# Patient Record
Sex: Female | Born: 1965 | Race: Black or African American | Hispanic: No | Marital: Married | Smoking: Never smoker
Health system: Southern US, Community
[De-identification: ages and names within clinical notes are randomized; demographics above are authoritative.]

## PROBLEM LIST (undated history)

## (undated) DIAGNOSIS — R0600 Dyspnea, unspecified: Secondary | ICD-10-CM

## (undated) DIAGNOSIS — F419 Anxiety disorder, unspecified: Secondary | ICD-10-CM

## (undated) DIAGNOSIS — E669 Obesity, unspecified: Secondary | ICD-10-CM

## (undated) DIAGNOSIS — I2699 Other pulmonary embolism without acute cor pulmonale: Secondary | ICD-10-CM

## (undated) DIAGNOSIS — F319 Bipolar disorder, unspecified: Secondary | ICD-10-CM

## (undated) DIAGNOSIS — F329 Major depressive disorder, single episode, unspecified: Secondary | ICD-10-CM

## (undated) DIAGNOSIS — M1990 Unspecified osteoarthritis, unspecified site: Secondary | ICD-10-CM

## (undated) DIAGNOSIS — M549 Dorsalgia, unspecified: Secondary | ICD-10-CM

## (undated) DIAGNOSIS — M5430 Sciatica, unspecified side: Secondary | ICD-10-CM

## (undated) DIAGNOSIS — Z8709 Personal history of other diseases of the respiratory system: Secondary | ICD-10-CM

## (undated) DIAGNOSIS — F32A Depression, unspecified: Secondary | ICD-10-CM

## (undated) DIAGNOSIS — I10 Essential (primary) hypertension: Secondary | ICD-10-CM

## (undated) DIAGNOSIS — G8929 Other chronic pain: Secondary | ICD-10-CM

## (undated) HISTORY — DX: Major depressive disorder, single episode, unspecified: F32.9

## (undated) HISTORY — DX: Bipolar disorder, unspecified: F31.9

## (undated) HISTORY — DX: Essential (primary) hypertension: I10

## (undated) HISTORY — DX: Obesity, unspecified: E66.9

## (undated) HISTORY — DX: Unspecified osteoarthritis, unspecified site: M19.90

## (undated) HISTORY — DX: Depression, unspecified: F32.A

## (undated) HISTORY — PX: ADENOIDECTOMY: SUR15

## (undated) HISTORY — PX: TONSILLECTOMY: SUR1361

## (undated) HISTORY — PX: TYMPANOSTOMY TUBE PLACEMENT: SHX32

## (undated) HISTORY — PX: BIOPSY STOMACH: PRO33

---

## 1998-04-26 ENCOUNTER — Emergency Department (HOSPITAL_COMMUNITY): Admission: EM | Admit: 1998-04-26 | Discharge: 1998-04-26 | Payer: Self-pay | Admitting: Emergency Medicine

## 1998-12-12 ENCOUNTER — Emergency Department (HOSPITAL_COMMUNITY): Admission: EM | Admit: 1998-12-12 | Discharge: 1998-12-12 | Payer: Self-pay | Admitting: Emergency Medicine

## 1999-06-21 ENCOUNTER — Emergency Department (HOSPITAL_COMMUNITY): Admission: EM | Admit: 1999-06-21 | Discharge: 1999-06-21 | Payer: Self-pay | Admitting: Emergency Medicine

## 1999-10-19 ENCOUNTER — Emergency Department (HOSPITAL_COMMUNITY): Admission: EM | Admit: 1999-10-19 | Discharge: 1999-10-19 | Payer: Self-pay | Admitting: Emergency Medicine

## 1999-10-28 ENCOUNTER — Emergency Department (HOSPITAL_COMMUNITY): Admission: EM | Admit: 1999-10-28 | Discharge: 1999-10-28 | Payer: Self-pay | Admitting: Emergency Medicine

## 2000-02-26 ENCOUNTER — Encounter: Payer: Self-pay | Admitting: *Deleted

## 2000-02-26 ENCOUNTER — Emergency Department (HOSPITAL_COMMUNITY): Admission: EM | Admit: 2000-02-26 | Discharge: 2000-02-26 | Payer: Self-pay | Admitting: *Deleted

## 2000-05-03 ENCOUNTER — Inpatient Hospital Stay (HOSPITAL_COMMUNITY): Admission: AD | Admit: 2000-05-03 | Discharge: 2000-05-03 | Payer: Self-pay | Admitting: *Deleted

## 2000-05-25 ENCOUNTER — Ambulatory Visit (HOSPITAL_COMMUNITY): Admission: RE | Admit: 2000-05-25 | Discharge: 2000-05-25 | Payer: Self-pay | Admitting: *Deleted

## 2000-07-19 ENCOUNTER — Ambulatory Visit (HOSPITAL_COMMUNITY): Admission: RE | Admit: 2000-07-19 | Discharge: 2000-07-19 | Payer: Self-pay | Admitting: *Deleted

## 2000-09-04 ENCOUNTER — Ambulatory Visit (HOSPITAL_COMMUNITY): Admission: RE | Admit: 2000-09-04 | Discharge: 2000-09-04 | Payer: Self-pay | Admitting: *Deleted

## 2000-10-13 ENCOUNTER — Inpatient Hospital Stay (HOSPITAL_COMMUNITY): Admission: AD | Admit: 2000-10-13 | Discharge: 2000-10-13 | Payer: Self-pay | Admitting: *Deleted

## 2000-12-06 ENCOUNTER — Inpatient Hospital Stay (HOSPITAL_COMMUNITY): Admission: AD | Admit: 2000-12-06 | Discharge: 2000-12-09 | Payer: Self-pay | Admitting: Obstetrics

## 2000-12-06 ENCOUNTER — Encounter (INDEPENDENT_AMBULATORY_CARE_PROVIDER_SITE_OTHER): Payer: Self-pay | Admitting: Specialist

## 2000-12-11 ENCOUNTER — Inpatient Hospital Stay (HOSPITAL_COMMUNITY): Admission: AD | Admit: 2000-12-11 | Discharge: 2000-12-11 | Payer: Self-pay | Admitting: Obstetrics

## 2001-04-16 ENCOUNTER — Emergency Department (HOSPITAL_COMMUNITY): Admission: EM | Admit: 2001-04-16 | Discharge: 2001-04-16 | Payer: Self-pay | Admitting: Emergency Medicine

## 2001-05-23 ENCOUNTER — Emergency Department (HOSPITAL_COMMUNITY): Admission: EM | Admit: 2001-05-23 | Discharge: 2001-05-23 | Payer: Self-pay | Admitting: Emergency Medicine

## 2001-07-04 ENCOUNTER — Encounter: Admission: RE | Admit: 2001-07-04 | Discharge: 2001-07-04 | Payer: Self-pay | Admitting: Family Medicine

## 2001-08-13 ENCOUNTER — Encounter: Admission: RE | Admit: 2001-08-13 | Discharge: 2001-08-13 | Payer: Self-pay | Admitting: Family Medicine

## 2001-08-15 ENCOUNTER — Encounter: Admission: RE | Admit: 2001-08-15 | Discharge: 2001-11-13 | Payer: Self-pay | Admitting: Family Medicine

## 2001-09-02 ENCOUNTER — Encounter: Admission: RE | Admit: 2001-09-02 | Discharge: 2001-09-02 | Payer: Self-pay | Admitting: Sports Medicine

## 2001-09-02 ENCOUNTER — Other Ambulatory Visit: Admission: RE | Admit: 2001-09-02 | Discharge: 2001-09-18 | Payer: Self-pay | Admitting: Obstetrics & Gynecology

## 2001-09-24 ENCOUNTER — Ambulatory Visit (HOSPITAL_BASED_OUTPATIENT_CLINIC_OR_DEPARTMENT_OTHER): Admission: RE | Admit: 2001-09-24 | Discharge: 2001-09-24 | Payer: Self-pay | Admitting: Ophthalmology

## 2001-10-08 ENCOUNTER — Encounter: Admission: RE | Admit: 2001-10-08 | Discharge: 2001-10-08 | Payer: Self-pay | Admitting: Family Medicine

## 2001-11-15 ENCOUNTER — Encounter: Admission: RE | Admit: 2001-11-15 | Discharge: 2001-11-15 | Payer: Self-pay | Admitting: Family Medicine

## 2001-12-16 ENCOUNTER — Encounter: Admission: RE | Admit: 2001-12-16 | Discharge: 2001-12-16 | Payer: Self-pay | Admitting: Family Medicine

## 2002-01-03 ENCOUNTER — Emergency Department (HOSPITAL_COMMUNITY): Admission: EM | Admit: 2002-01-03 | Discharge: 2002-01-03 | Payer: Self-pay | Admitting: Emergency Medicine

## 2002-02-04 ENCOUNTER — Encounter: Payer: Self-pay | Admitting: Emergency Medicine

## 2002-02-04 ENCOUNTER — Emergency Department (HOSPITAL_COMMUNITY): Admission: EM | Admit: 2002-02-04 | Discharge: 2002-02-04 | Payer: Self-pay | Admitting: Emergency Medicine

## 2002-02-27 ENCOUNTER — Encounter: Admission: RE | Admit: 2002-02-27 | Discharge: 2002-02-27 | Payer: Self-pay | Admitting: Sports Medicine

## 2002-04-04 ENCOUNTER — Encounter: Admission: RE | Admit: 2002-04-04 | Discharge: 2002-04-04 | Payer: Self-pay | Admitting: Family Medicine

## 2002-04-09 ENCOUNTER — Encounter: Admission: RE | Admit: 2002-04-09 | Discharge: 2002-04-09 | Payer: Self-pay | Admitting: Sports Medicine

## 2002-04-09 ENCOUNTER — Encounter: Payer: Self-pay | Admitting: Sports Medicine

## 2002-04-10 ENCOUNTER — Encounter: Admission: RE | Admit: 2002-04-10 | Discharge: 2002-04-10 | Payer: Self-pay | Admitting: Family Medicine

## 2002-04-24 ENCOUNTER — Encounter: Admission: RE | Admit: 2002-04-24 | Discharge: 2002-04-24 | Payer: Self-pay | Admitting: Family Medicine

## 2002-08-27 ENCOUNTER — Emergency Department (HOSPITAL_COMMUNITY): Admission: EM | Admit: 2002-08-27 | Discharge: 2002-08-28 | Payer: Self-pay | Admitting: Emergency Medicine

## 2003-01-05 ENCOUNTER — Encounter: Admission: RE | Admit: 2003-01-05 | Discharge: 2003-01-05 | Payer: Self-pay | Admitting: Family Medicine

## 2003-02-12 ENCOUNTER — Encounter: Admission: RE | Admit: 2003-02-12 | Discharge: 2003-02-12 | Payer: Self-pay | Admitting: Family Medicine

## 2003-03-22 ENCOUNTER — Emergency Department (HOSPITAL_COMMUNITY): Admission: EM | Admit: 2003-03-22 | Discharge: 2003-03-22 | Payer: Self-pay | Admitting: Unknown Physician Specialty

## 2003-03-25 ENCOUNTER — Encounter: Admission: RE | Admit: 2003-03-25 | Discharge: 2003-03-25 | Payer: Self-pay | Admitting: Family Medicine

## 2003-04-09 ENCOUNTER — Encounter: Admission: RE | Admit: 2003-04-09 | Discharge: 2003-04-09 | Payer: Self-pay | Admitting: Sports Medicine

## 2003-04-26 ENCOUNTER — Encounter: Payer: Self-pay | Admitting: Emergency Medicine

## 2003-04-26 ENCOUNTER — Emergency Department (HOSPITAL_COMMUNITY): Admission: EM | Admit: 2003-04-26 | Discharge: 2003-04-26 | Payer: Self-pay | Admitting: Emergency Medicine

## 2003-06-23 ENCOUNTER — Encounter: Admission: RE | Admit: 2003-06-23 | Discharge: 2003-06-23 | Payer: Self-pay | Admitting: Family Medicine

## 2003-07-07 ENCOUNTER — Emergency Department (HOSPITAL_COMMUNITY): Admission: EM | Admit: 2003-07-07 | Discharge: 2003-07-07 | Payer: Self-pay | Admitting: Emergency Medicine

## 2003-07-23 ENCOUNTER — Encounter: Admission: RE | Admit: 2003-07-23 | Discharge: 2003-07-23 | Payer: Self-pay | Admitting: Family Medicine

## 2003-07-25 ENCOUNTER — Emergency Department (HOSPITAL_COMMUNITY): Admission: EM | Admit: 2003-07-25 | Discharge: 2003-07-25 | Payer: Self-pay | Admitting: Emergency Medicine

## 2003-07-29 ENCOUNTER — Encounter: Admission: RE | Admit: 2003-07-29 | Discharge: 2003-07-29 | Payer: Self-pay | Admitting: Family Medicine

## 2003-08-28 ENCOUNTER — Encounter: Admission: RE | Admit: 2003-08-28 | Discharge: 2003-08-28 | Payer: Self-pay | Admitting: Sports Medicine

## 2003-08-28 ENCOUNTER — Other Ambulatory Visit: Admission: RE | Admit: 2003-08-28 | Discharge: 2003-08-28 | Payer: Self-pay | Admitting: Family Medicine

## 2003-10-17 ENCOUNTER — Emergency Department (HOSPITAL_COMMUNITY): Admission: EM | Admit: 2003-10-17 | Discharge: 2003-10-17 | Payer: Self-pay | Admitting: Emergency Medicine

## 2003-11-07 HISTORY — PX: KNEE SURGERY: SHX244

## 2003-11-13 ENCOUNTER — Ambulatory Visit (HOSPITAL_COMMUNITY): Admission: RE | Admit: 2003-11-13 | Discharge: 2003-11-13 | Payer: Self-pay | Admitting: Family Medicine

## 2003-11-13 ENCOUNTER — Encounter: Admission: RE | Admit: 2003-11-13 | Discharge: 2003-11-13 | Payer: Self-pay | Admitting: Family Medicine

## 2003-11-26 ENCOUNTER — Encounter: Admission: RE | Admit: 2003-11-26 | Discharge: 2003-11-26 | Payer: Self-pay | Admitting: Family Medicine

## 2003-12-01 ENCOUNTER — Emergency Department (HOSPITAL_COMMUNITY): Admission: EM | Admit: 2003-12-01 | Discharge: 2003-12-02 | Payer: Self-pay | Admitting: Emergency Medicine

## 2003-12-03 ENCOUNTER — Encounter: Admission: RE | Admit: 2003-12-03 | Discharge: 2003-12-03 | Payer: Self-pay | Admitting: Family Medicine

## 2003-12-08 ENCOUNTER — Encounter: Admission: RE | Admit: 2003-12-08 | Discharge: 2003-12-08 | Payer: Self-pay | Admitting: Family Medicine

## 2003-12-09 ENCOUNTER — Ambulatory Visit (HOSPITAL_COMMUNITY): Admission: RE | Admit: 2003-12-09 | Discharge: 2003-12-09 | Payer: Self-pay | Admitting: Family Medicine

## 2003-12-09 ENCOUNTER — Encounter: Admission: RE | Admit: 2003-12-09 | Discharge: 2003-12-09 | Payer: Self-pay | Admitting: Family Medicine

## 2003-12-10 ENCOUNTER — Encounter: Admission: RE | Admit: 2003-12-10 | Discharge: 2003-12-10 | Payer: Self-pay | Admitting: Sports Medicine

## 2003-12-18 ENCOUNTER — Emergency Department (HOSPITAL_COMMUNITY): Admission: EM | Admit: 2003-12-18 | Discharge: 2003-12-18 | Payer: Self-pay | Admitting: Family Medicine

## 2003-12-30 ENCOUNTER — Encounter: Payer: Self-pay | Admitting: Cardiology

## 2003-12-30 ENCOUNTER — Encounter: Admission: RE | Admit: 2003-12-30 | Discharge: 2003-12-30 | Payer: Self-pay | Admitting: Family Medicine

## 2003-12-30 ENCOUNTER — Ambulatory Visit (HOSPITAL_COMMUNITY): Admission: RE | Admit: 2003-12-30 | Discharge: 2003-12-30 | Payer: Self-pay | Admitting: Sports Medicine

## 2004-01-12 ENCOUNTER — Encounter: Admission: RE | Admit: 2004-01-12 | Discharge: 2004-01-12 | Payer: Self-pay | Admitting: Family Medicine

## 2004-01-15 ENCOUNTER — Encounter: Admission: RE | Admit: 2004-01-15 | Discharge: 2004-01-15 | Payer: Self-pay | Admitting: Family Medicine

## 2004-01-20 ENCOUNTER — Encounter (HOSPITAL_COMMUNITY): Admission: RE | Admit: 2004-01-20 | Discharge: 2004-04-19 | Payer: Self-pay | Admitting: Cardiovascular Disease

## 2004-02-05 ENCOUNTER — Encounter: Admission: RE | Admit: 2004-02-05 | Discharge: 2004-02-05 | Payer: Self-pay | Admitting: Family Medicine

## 2004-02-15 ENCOUNTER — Encounter: Admission: RE | Admit: 2004-02-15 | Discharge: 2004-02-15 | Payer: Self-pay | Admitting: Family Medicine

## 2004-03-31 ENCOUNTER — Emergency Department (HOSPITAL_COMMUNITY): Admission: EM | Admit: 2004-03-31 | Discharge: 2004-04-01 | Payer: Self-pay | Admitting: Emergency Medicine

## 2004-08-09 ENCOUNTER — Emergency Department (HOSPITAL_COMMUNITY): Admission: EM | Admit: 2004-08-09 | Discharge: 2004-08-10 | Payer: Self-pay | Admitting: Emergency Medicine

## 2004-08-21 ENCOUNTER — Emergency Department (HOSPITAL_COMMUNITY): Admission: EM | Admit: 2004-08-21 | Discharge: 2004-08-21 | Payer: Self-pay | Admitting: Family Medicine

## 2004-10-29 ENCOUNTER — Emergency Department (HOSPITAL_COMMUNITY): Admission: EM | Admit: 2004-10-29 | Discharge: 2004-10-29 | Payer: Self-pay | Admitting: Family Medicine

## 2004-10-31 ENCOUNTER — Emergency Department (HOSPITAL_COMMUNITY): Admission: EM | Admit: 2004-10-31 | Discharge: 2004-10-31 | Payer: Self-pay | Admitting: Family Medicine

## 2004-11-11 ENCOUNTER — Encounter (INDEPENDENT_AMBULATORY_CARE_PROVIDER_SITE_OTHER): Payer: Self-pay | Admitting: *Deleted

## 2004-11-11 LAB — CONVERTED CEMR LAB

## 2004-11-28 ENCOUNTER — Other Ambulatory Visit: Admission: RE | Admit: 2004-11-28 | Discharge: 2004-11-28 | Payer: Self-pay | Admitting: Family Medicine

## 2004-11-28 ENCOUNTER — Ambulatory Visit: Payer: Self-pay | Admitting: Family Medicine

## 2004-12-29 ENCOUNTER — Ambulatory Visit: Payer: Self-pay | Admitting: Family Medicine

## 2005-01-10 ENCOUNTER — Ambulatory Visit: Payer: Self-pay | Admitting: Family Medicine

## 2005-01-11 ENCOUNTER — Ambulatory Visit: Payer: Self-pay | Admitting: Family Medicine

## 2005-01-14 ENCOUNTER — Emergency Department (HOSPITAL_COMMUNITY): Admission: EM | Admit: 2005-01-14 | Discharge: 2005-01-15 | Payer: Self-pay | Admitting: Emergency Medicine

## 2005-01-18 ENCOUNTER — Ambulatory Visit: Payer: Self-pay | Admitting: Family Medicine

## 2005-01-20 ENCOUNTER — Encounter: Admission: RE | Admit: 2005-01-20 | Discharge: 2005-01-20 | Payer: Self-pay | Admitting: Sports Medicine

## 2005-02-08 ENCOUNTER — Emergency Department (HOSPITAL_COMMUNITY): Admission: EM | Admit: 2005-02-08 | Discharge: 2005-02-08 | Payer: Self-pay | Admitting: Emergency Medicine

## 2005-02-15 ENCOUNTER — Ambulatory Visit: Payer: Self-pay | Admitting: Family Medicine

## 2005-03-08 ENCOUNTER — Ambulatory Visit: Payer: Self-pay | Admitting: Family Medicine

## 2005-03-12 ENCOUNTER — Emergency Department (HOSPITAL_COMMUNITY): Admission: EM | Admit: 2005-03-12 | Discharge: 2005-03-12 | Payer: Self-pay | Admitting: Family Medicine

## 2005-11-06 HISTORY — PX: CHOLECYSTECTOMY: SHX55

## 2007-01-03 DIAGNOSIS — F329 Major depressive disorder, single episode, unspecified: Secondary | ICD-10-CM

## 2007-01-03 DIAGNOSIS — I871 Compression of vein: Secondary | ICD-10-CM

## 2007-01-03 DIAGNOSIS — F32A Depression, unspecified: Secondary | ICD-10-CM | POA: Insufficient documentation

## 2007-01-03 DIAGNOSIS — E669 Obesity, unspecified: Secondary | ICD-10-CM | POA: Insufficient documentation

## 2007-01-03 DIAGNOSIS — H9190 Unspecified hearing loss, unspecified ear: Secondary | ICD-10-CM | POA: Insufficient documentation

## 2007-01-03 DIAGNOSIS — K589 Irritable bowel syndrome without diarrhea: Secondary | ICD-10-CM | POA: Insufficient documentation

## 2007-01-04 ENCOUNTER — Encounter (INDEPENDENT_AMBULATORY_CARE_PROVIDER_SITE_OTHER): Payer: Self-pay | Admitting: *Deleted

## 2007-11-07 HISTORY — PX: CARDIAC CATHETERIZATION: SHX172

## 2009-03-19 ENCOUNTER — Emergency Department (HOSPITAL_COMMUNITY): Admission: EM | Admit: 2009-03-19 | Discharge: 2009-03-20 | Payer: Self-pay | Admitting: Emergency Medicine

## 2009-06-23 ENCOUNTER — Emergency Department (HOSPITAL_COMMUNITY): Admission: EM | Admit: 2009-06-23 | Discharge: 2009-06-23 | Payer: Self-pay | Admitting: Family Medicine

## 2009-07-24 ENCOUNTER — Emergency Department (HOSPITAL_COMMUNITY): Admission: EM | Admit: 2009-07-24 | Discharge: 2009-07-24 | Payer: Self-pay | Admitting: Emergency Medicine

## 2009-08-03 ENCOUNTER — Emergency Department (HOSPITAL_COMMUNITY): Admission: EM | Admit: 2009-08-03 | Discharge: 2009-08-03 | Payer: Self-pay | Admitting: Emergency Medicine

## 2009-11-06 LAB — CONVERTED CEMR LAB: Pap Smear: NORMAL

## 2009-12-01 ENCOUNTER — Emergency Department (HOSPITAL_COMMUNITY): Admission: EM | Admit: 2009-12-01 | Discharge: 2009-12-01 | Payer: Self-pay | Admitting: Emergency Medicine

## 2010-01-17 ENCOUNTER — Encounter: Payer: Self-pay | Admitting: Family Medicine

## 2010-01-17 ENCOUNTER — Ambulatory Visit: Payer: Self-pay | Admitting: Family Medicine

## 2010-01-17 DIAGNOSIS — G4700 Insomnia, unspecified: Secondary | ICD-10-CM

## 2010-01-17 DIAGNOSIS — F319 Bipolar disorder, unspecified: Secondary | ICD-10-CM | POA: Insufficient documentation

## 2010-01-17 DIAGNOSIS — I10 Essential (primary) hypertension: Secondary | ICD-10-CM | POA: Insufficient documentation

## 2010-01-27 ENCOUNTER — Ambulatory Visit: Payer: Self-pay | Admitting: Family Medicine

## 2010-01-27 ENCOUNTER — Encounter: Payer: Self-pay | Admitting: Family Medicine

## 2010-01-31 LAB — CONVERTED CEMR LAB
ALT: 10 units/L (ref 0–35)
Albumin: 4.1 g/dL (ref 3.5–5.2)
CO2: 29 meq/L (ref 19–32)
Cholesterol: 157 mg/dL (ref 0–200)
Glucose, Bld: 89 mg/dL (ref 70–99)
LDL Cholesterol: 96 mg/dL (ref 0–99)
Platelets: 356 10*3/uL (ref 150–400)
Potassium: 3.2 meq/L — ABNORMAL LOW (ref 3.5–5.3)
RBC: 4.31 M/uL (ref 3.87–5.11)
Sodium: 135 meq/L (ref 135–145)
Total Protein: 7.7 g/dL (ref 6.0–8.3)
Triglycerides: 69 mg/dL (ref <150)
VLDL: 14 mg/dL (ref 0–40)
Vit D, 25-Hydroxy: 11 ng/mL — ABNORMAL LOW (ref 30–89)
WBC: 8.7 10*3/uL (ref 4.0–10.5)

## 2010-02-18 ENCOUNTER — Ambulatory Visit: Payer: Self-pay | Admitting: Family Medicine

## 2010-02-18 DIAGNOSIS — E559 Vitamin D deficiency, unspecified: Secondary | ICD-10-CM | POA: Insufficient documentation

## 2010-02-21 ENCOUNTER — Encounter: Payer: Self-pay | Admitting: Family Medicine

## 2010-02-24 ENCOUNTER — Encounter: Admission: RE | Admit: 2010-02-24 | Discharge: 2010-02-24 | Payer: Self-pay | Admitting: Emergency Medicine

## 2010-02-24 ENCOUNTER — Emergency Department (HOSPITAL_COMMUNITY): Admission: EM | Admit: 2010-02-24 | Discharge: 2010-02-24 | Payer: Self-pay | Admitting: Family Medicine

## 2010-03-07 ENCOUNTER — Ambulatory Visit: Payer: Self-pay | Admitting: Family Medicine

## 2010-03-25 ENCOUNTER — Encounter: Payer: Self-pay | Admitting: Family Medicine

## 2010-03-25 ENCOUNTER — Emergency Department (HOSPITAL_COMMUNITY): Admission: EM | Admit: 2010-03-25 | Discharge: 2010-03-25 | Payer: Self-pay | Admitting: Emergency Medicine

## 2010-04-11 ENCOUNTER — Ambulatory Visit: Payer: Self-pay | Admitting: Family Medicine

## 2010-04-11 DIAGNOSIS — M719 Bursopathy, unspecified: Secondary | ICD-10-CM

## 2010-04-11 DIAGNOSIS — M67919 Unspecified disorder of synovium and tendon, unspecified shoulder: Secondary | ICD-10-CM | POA: Insufficient documentation

## 2010-04-29 ENCOUNTER — Ambulatory Visit: Payer: Self-pay | Admitting: Family Medicine

## 2010-06-22 ENCOUNTER — Ambulatory Visit: Payer: Self-pay | Admitting: Family Medicine

## 2010-06-22 DIAGNOSIS — M1710 Unilateral primary osteoarthritis, unspecified knee: Secondary | ICD-10-CM | POA: Insufficient documentation

## 2010-07-06 ENCOUNTER — Encounter
Admission: RE | Admit: 2010-07-06 | Discharge: 2010-07-31 | Payer: Self-pay | Source: Home / Self Care | Admitting: Family Medicine

## 2010-07-19 ENCOUNTER — Encounter: Payer: Self-pay | Admitting: Family Medicine

## 2010-07-19 ENCOUNTER — Telehealth: Payer: Self-pay | Admitting: Psychology

## 2010-07-19 ENCOUNTER — Encounter: Payer: Self-pay | Admitting: *Deleted

## 2010-07-19 ENCOUNTER — Ambulatory Visit: Payer: Self-pay | Admitting: Family Medicine

## 2010-07-19 LAB — CONVERTED CEMR LAB
BUN: 12 mg/dL
CO2: 30 meq/L
Calcium: 8.7 mg/dL
Chloride: 102 meq/L
Creatinine, Ser: 0.66 mg/dL
Glucose, Bld: 92 mg/dL
Potassium: 3.9 meq/L
Sodium: 139 meq/L

## 2010-07-22 ENCOUNTER — Ambulatory Visit (HOSPITAL_COMMUNITY): Admission: RE | Admit: 2010-07-22 | Discharge: 2010-07-22 | Payer: Self-pay | Admitting: Family Medicine

## 2010-07-23 ENCOUNTER — Emergency Department (HOSPITAL_COMMUNITY): Admission: EM | Admit: 2010-07-23 | Discharge: 2010-07-23 | Payer: Self-pay | Admitting: Emergency Medicine

## 2010-07-27 ENCOUNTER — Encounter: Payer: Self-pay | Admitting: Family Medicine

## 2010-08-02 ENCOUNTER — Ambulatory Visit: Payer: Self-pay | Admitting: Family Medicine

## 2010-08-08 ENCOUNTER — Ambulatory Visit: Payer: Self-pay | Admitting: Family Medicine

## 2010-08-12 ENCOUNTER — Ambulatory Visit: Payer: Self-pay | Admitting: Family Medicine

## 2010-08-29 ENCOUNTER — Emergency Department (HOSPITAL_COMMUNITY): Admission: EM | Admit: 2010-08-29 | Discharge: 2010-08-29 | Payer: Self-pay | Admitting: Emergency Medicine

## 2010-08-30 ENCOUNTER — Ambulatory Visit: Payer: Self-pay | Admitting: Family Medicine

## 2010-09-10 ENCOUNTER — Emergency Department (HOSPITAL_COMMUNITY): Admission: EM | Admit: 2010-09-10 | Discharge: 2010-09-10 | Payer: Self-pay | Admitting: Emergency Medicine

## 2010-09-14 ENCOUNTER — Ambulatory Visit: Payer: Self-pay | Admitting: Family Medicine

## 2010-09-14 DIAGNOSIS — M722 Plantar fascial fibromatosis: Secondary | ICD-10-CM | POA: Insufficient documentation

## 2010-10-22 ENCOUNTER — Encounter
Admission: RE | Admit: 2010-10-22 | Discharge: 2010-10-22 | Payer: Self-pay | Source: Home / Self Care | Attending: Oral Surgery | Admitting: Oral Surgery

## 2010-10-31 ENCOUNTER — Emergency Department (HOSPITAL_COMMUNITY)
Admission: EM | Admit: 2010-10-31 | Discharge: 2010-10-31 | Payer: Self-pay | Source: Home / Self Care | Admitting: Emergency Medicine

## 2010-11-10 ENCOUNTER — Ambulatory Visit: Admission: RE | Admit: 2010-11-10 | Discharge: 2010-11-10 | Payer: Self-pay | Source: Home / Self Care

## 2010-11-10 DIAGNOSIS — N946 Dysmenorrhea, unspecified: Secondary | ICD-10-CM | POA: Insufficient documentation

## 2010-11-13 ENCOUNTER — Emergency Department (HOSPITAL_COMMUNITY)
Admission: EM | Admit: 2010-11-13 | Discharge: 2010-11-14 | Payer: Self-pay | Source: Home / Self Care | Admitting: Emergency Medicine

## 2010-11-23 ENCOUNTER — Emergency Department (HOSPITAL_COMMUNITY)
Admission: EM | Admit: 2010-11-23 | Discharge: 2010-11-23 | Payer: Self-pay | Source: Home / Self Care | Admitting: Emergency Medicine

## 2010-12-08 NOTE — Assessment & Plan Note (Signed)
Summary: discuss knee surgery,df   Vital Signs:  Patient profile:   45 year old female Height:      65 inches Weight:      288.56 pounds BMI:     48.19 BSA:     2.31 Temp:     98.1 degrees F Pulse rate:   66 / minute BP sitting:   123 / 83  Vitals Entered By: Jone Baseman CMA (September 14, 2010 8:57 AM) CC: discuss knee surgery Is Patient Diabetic? No Pain Assessment Patient in pain? yes     Location: knees Intensity: 8   Primary Care Finn Amos:  Antoine Primas DO  CC:  discuss knee surgery.  History of Present Illness: 45 yo female here for f/u on knee pain  1.  Knee pain-   Pt did go to [facility] and recieved steroid injections that only lasted three days.  Pt is still having significant pain, mostly after she is done wlaking seems to be worse.  Pt has had rheumatological workup which was negative as well.  Pt has hcx of arthroscopiy on right knee by Dr. Farris Has in 2005 and pt is at the point that she feels she needs another intervention, she is unable to do all daily activities as she did before.  No swelling no discoloration, still able to bear weight though.  Tried tylenol with no help and vicodin only made her sleepy.   2.  BP today:123/83 Taking Meds:yes Side Effects:no ROS: denies Headache visual changes nausea vomiting abdominal pain numbness in extremities    3. Heel pain-  Pt states for the last couple months has had heel pain that has become worse.  Pt states it is worse right after sitting a longtime or in the AM.  Pt states once she get moving it helps.  Pain usually staty at the lower hell but can radiate down the foot. No numbness or loss of strength.   4. Obesity- Pt has lost 3 # since last visit.  Pt states she has been trying to workout but due to her pain issues is having trouble. Pt is watching her diet and is seeing a nutritionist.     Habits & Providers  Alcohol-Tobacco-Diet     Tobacco Status: never  Current Medications (verified): 1)   Hydrochlorothiazide 25 Mg Tabs (Hydrochlorothiazide) .... One Tab By Mouth Daily For Blood Pressure 2)  Vistaril 50 Mg Caps (Hydroxyzine Pamoate) .... One Capsule Every 6 Hours 3)  Toprol Xl 25 Mg Xr24h-Tab (Metoprolol Succinate) .... One Tab By Mouth Daily For Blood Pressure 4)  Pristiq 50 Mg Xr24h-Tab (Desvenlafaxine Succinate) .... One Tab By Mouth Daily For Depression 5)  Lamotrigine 200 Mg Tabs (Lamotrigine) .... One Tab By Mouth Every Morning 6)  Zofran 8 Mg Tabs (Ondansetron Hcl) .... One Tab By Mouth Two Times A Day Prn 7)  Prevacid 30 Mg Cpdr (Lansoprazole) .... One By Mouth Daily For Reflux 8)  D3-50 50000 Unit Caps (Cholecalciferol) .... One By Mouth Once A Week For 8 Weeks 9)  Calcium Carbonate-Vitamin D 600-400 Mg-Unit Tabs (Calcium Carbonate-Vitamin D) .... One By Mouth Two Times A Day With Meals 10)  Nortriptyline Hcl 25 Mg Caps (Nortriptyline Hcl) .... Take 1 Pill At Night 11)  Diclofenac Sodium 75 Mg Tbec (Diclofenac Sodium) .... Take 1 Tab By Mouth By Mouth Two Times A Day  Allergies (verified): No Known Drug Allergies  Past History:  Past medical, surgical, family and social histories (including risk factors) reviewed, and no  changes noted (except as noted below).  Past Medical History: Reviewed history from 01/17/2010 and no changes required. depression - diagnosed  ~ 2000 bipolar disorder  insomnia - has tried trazodone, lunesta, rozerem HTN h/o low heart rate Raynaud's phenomenon  Past Surgical History: Reviewed history from 01/17/2010 and no changes required. CS x 2 1997, 2002 BTL 2002 heart cath in [place] 2006 -- no disease per the patient GB removed  ~ 2007 arthroscopic knee surgery 11/05 tonils and adenoids out as a shild  Family History: Reviewed history from 01/17/2010 and no changes required. brother - HIV, brother- hearing loss,  father - deceased, DM,  mother - CAD, MI at age 85, sister -  passed away from cardiac abnorm, rheumatic dz,  obesity  Social History: Reviewed history from 03/07/2010 and no changes required. no cigarettes; occasional THC; 1-2 mixed drinks per week.  Exercises about 4 x per week with walking. Lives with 2 dtrs (13 and 9). Not working right now. 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  Review of Systems       denies fever, chills, nausea, vomiting, diarrhea or constipation see hpi  Physical Exam  General:  alert, well-developed, well-hydrated, and overweight-appearing.   Mouth:  right upper 1st molar on right side has broken tooth with erythema surrounding it no frank pus notied.  Lungs:  work of breathing unlabored, clear to auscultation bilaterally; no wheezes, rales, or ronchi; good air movement throughout  Heart:  regular rate and rhythm, no murmurs; normal s1/s2  Abdomen:  BS+, NT obese Msk:  B knees external changes of OA.  Right knee TTP medial > lateral joint line. no effusion, no erythema or warmth. well healed circular dime shaped scar medial superior knee. calf is soft.  Mild crepitus on movement.  PCL, ACL, MCL LCL intact  Left knee less significant findings then right knee but still has crepitus, and tender to palpation over the medial joint line Pulses:  2+ Extremities:  right foot, pain over the medical arch and the heel, no erythem no swelling. calf soft. achilles tendon no pain.  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  Pt is having problems with daily activities at this time and is not dealing with the pain well.   Will stop the vicodin, will give diclofenac to try with food.  Pt had x-rays done in 9/11 that showed significant OA for her age. Will send pt to orthopaedics to have evaluation, pt saw Dr. Farris Has in 2005 and had arthroscopic procedure. Pt would be willing to have another one  if it would be benficial.  Pt had rheumatological workup which was negative.  The following medications were removed from the medication list:    Vicodin 5-500 Mg Tabs (Hydrocodone-acetaminophen) .Marland Kitchen... Take 1-2 every 6 hours for pain Her updated medication list for this problem includes:    Diclofenac Sodium 75 Mg Tbec (Diclofenac sodium) .Marland Kitchen... Take 1 tab by mouth by mouth two times a day  Orders: [facility]- Est  Level 4 (16109) Orthopedic Referral (Ortho)  Problem # 2:  ESSENTIAL HYPERTENSION, BENIGN (ICD-401.1) at goal will continue current treatment Her updated medication list for this problem includes:    Hydrochlorothiazide 25 Mg Tabs (Hydrochlorothiazide) ..... One tab by mouth daily for blood pressure    Toprol Xl 25 Mg Xr24h-tab (Metoprolol succinate) ..... One tab  by mouth daily for blood pressure  Orders: [facility]- Est  Level 4 (99214)  Problem # 3:  OBESITY, NOS (ICD-278.00) gained 3 # hard to exercise due to knee pain and plantar fascitis. Talked about food choices and nutrition and the need for decreasing calories.  Orders: [facility]- Est  Level 4 (16109)  Problem # 4:  PLANTAR FASCIITIS (ICD-728.71) Assessment: New new, told pt of brace at night and gave exercises with tennis ball and sode can to help, falling arches so did talk about arch support insoles.  If still having problems would consider sending to sports medicine for orthotic to be made. May also benefit from shot but just recieved shot for knee a couple weeks agao at sports medicine.  Her updated medication list for this problem includes:    Diclofenac Sodium 75 Mg Tbec (Diclofenac sodium) .Marland Kitchen... Take 1 tab by mouth by mouth two times a day  Orders: [facility]- Est  Level 4 (60454)  Complete Medication List: 1)  Hydrochlorothiazide 25 Mg Tabs (Hydrochlorothiazide) .... One tab by mouth daily for blood pressure 2)  Vistaril 50 Mg Caps (Hydroxyzine pamoate) .... One capsule every 6 hours 3)  Toprol Xl 25 Mg Xr24h-tab (Metoprolol  succinate) .... One tab by mouth daily for blood pressure 4)  Pristiq 50 Mg Xr24h-tab (Desvenlafaxine succinate) .... One tab by mouth daily for depression 5)  Lamotrigine 200 Mg Tabs (Lamotrigine) .... One tab by mouth every morning 6)  Zofran 8 Mg Tabs (Ondansetron hcl) .... One tab by mouth two times a day prn 7)  Prevacid 30 Mg Cpdr (Lansoprazole) .... One by mouth daily for reflux 8)  D3-50 50000 Unit Caps (Cholecalciferol) .... One by mouth once a week for 8 weeks 9)  Calcium Carbonate-vitamin D 600-400 Mg-unit Tabs (Calcium carbonate-vitamin d) .... One by mouth two times a day with meals 10)  Nortriptyline Hcl 25 Mg Caps (Nortriptyline hcl) .... Take 1 pill at night 11)  Diclofenac Sodium 75 Mg Tbec (Diclofenac sodium) .... Take 1 tab by mouth by mouth two times a day  Patient Instructions: 1)  Very good to see you 2)  I will send a referral over to [facility], they can talk to you about another arthroscopic procedure v.s. synvisc for your knee. 3)  I will give you some diclofenac.  Take 1 tab two times a day with food.  4)  Continue to try to lose weight 5)  Your blood pressure is great  6)  I need to see you again in 3 months for sure or after you see [facility] (Dr. Farris Has). Prescriptions: DICLOFENAC SODIUM 75 MG TBEC (DICLOFENAC SODIUM) take 1 tab by mouth by mouth two times a day  #62 x 3   Entered and Authorized by:   Antoine Primas DO   Signed by:   Antoine Primas DO on 09/14/2010   Method used:   Electronically to        [facility] [phone]* (retail)       [address]       [place], [place]  [address]       Ph: [phone]       Fax: [phone]   RxID:   [id number]    Orders Added: 1)  [facility]- Est  Level 4 [66440] 2)  Orthopedic Referral [Ortho]

## 2010-12-08 NOTE — Consult Note (Signed)
Summary: [facility] d/c due to no show  [facility]   Imported By: De Nurse 08/11/2010 15:11:57  _____________________________________________________________________  External Attachment:    Type:   Image     Comment:   External Document

## 2010-12-08 NOTE — Assessment & Plan Note (Signed)
Summary: NP FOR NUTRI/KH   Vital Signs:  Patient profile:   45 year old female Height:      65 inches Weight:      291.9 pounds BMI:     48.75  Vitals Entered By: Wyona Almas PHD (August 30, 2010 4:22 PM)  Primary Care Provider:  Antoine Primas DO   History of Present Illness: Assessment:  Spent 60 min with pt.  Usual eating pattern includes 1-3 meals a day and sometimes snacks, mostly erratic schedule.  Everyday foods/beverages include  ~32 oz juice drink.   Katie [name] has 3 children, an infant boy, and 13-YO, and 9-YO daughters.  She lost 140 lb between 2008-9 while living in [place], to  ~210 lb, then regained most of it after moving back to [place] in July 2010.  She got her appetite back and stopped walking at that point.  Last summer she started to have knee pain, so walking is now difficult.  She manages to do knee strengthening exercises  ~4 X wk.  24-hr recall suggests intake of  ~1200 kcal: (up 5 AM) B (8 AM)- 3 c broc, mushrm, & cheese  (1.5-2 oz cheese) soup, 16 oz juice drink; (back to sleep 8:30-noon); L (1:45 PM)- 2 small Slim Jims, 16 oz soda; Snk (2:30)- handfull chips, 1/4 c sour cream dip; D- none; had to go to ER b/c of locked TM joint.    Nutrition Diagnosis:  Physical inactivity (NB-2.1) related to knee pain as evidenced by no exercise other than rehab exercises 4 X wk.  Erratic eating and sleep pattern is contributing to poor food choices (and probably to poor energy levels).  Inappropriate intake of types of carbohydrate (NI-53.3) related to inadequate fruits and veg's as evidenced by only fruit intake limited to average of 32 oz of juice, and erratic veg intake, according to pt report.   Intervention: See Patient Instructions.    Monitoring/Eval:  Dietary intake, body weight, and exercise at 4-mo F/U.     Allergies: No Known Drug Allergies   Complete Medication List: 1)  Hydrochlorothiazide 25 Mg Tabs (Hydrochlorothiazide) .... One tab by mouth daily for blood pressure 2)   Vistaril 50 Mg Caps (Hydroxyzine pamoate) .... One capsule every 6 hours 3)  Toprol Xl 25 Mg Xr24h-tab (Metoprolol succinate) .... One tab by mouth daily for blood pressure 4)  Pristiq 50 Mg Xr24h-tab (Desvenlafaxine succinate) .... One tab by mouth daily for depression 5)  Lamotrigine 200 Mg Tabs (Lamotrigine) .... One tab by mouth every morning 6)  Zofran 8 Mg Tabs (Ondansetron hcl) .... One tab by mouth two times a day prn 7)  Prevacid 30 Mg Cpdr (Lansoprazole) .... One by mouth daily for reflux 8)  D3-50 50000 Unit Caps (Cholecalciferol) .... One by mouth once a week for 8 weeks 9)  Calcium Carbonate-vitamin D 600-400 Mg-unit Tabs (Calcium carbonate-vitamin d) .... One by mouth two times a day with meals 10)  Nortriptyline Hcl 25 Mg Caps (Nortriptyline hcl) .... Take 1 pill at night 11)  Vicodin 5-500 Mg Tabs (Hydrocodone-acetaminophen) .... Take 1-2 every 6 hours for pain 12)  Amoxicillin 500 Mg Tabs (Amoxicillin) .Marland Kitchen.. 1 tab by mouth two times a day for the next 10 days  Other Orders: Inital Assessment Each - [facility] [phone])  Patient Instructions: 1)  Do your best to get on a more consistent schedule of eating, sleeping, and awake time.   2)  Eat at least 3 meals and 1-2 snacks per day and  no more than 5 hours between eating.  3)  I want to encourage you to take care of yourself as much as you take care of your children:  Eat when they eat.  If you want to lose weight, you need to focus on setting aside time for you: 4)  Time for food planning, eating, and for exercise.   5)  Obtain twice as many veg's as protein or carbohydrate foods for both lunch and dinner. 6)  Knee exercises EVERY DAY, so you can get back to walking soon. Please follow through with your plan to explore water aerobics classes.   7)  Switch to 1% milk (2.5 grams of fat per cup vs. 5 grams fat per cup in 2%).  8)  Schedule a follow-up appt for Nov/Dec.     Orders Added: 1)  Inital Assessment Each - [facility]  [16109]

## 2010-12-08 NOTE — Assessment & Plan Note (Signed)
Summary: F/U/KH   Vital Signs:  Patient profile:   45 year old female Height:      65 inches Weight:      265 pounds BMI:     44.26 BSA:     2.23 Temp:     98.3 degrees F Pulse rate:   73 / minute BP sitting:   131 / 81  Vitals Entered By: Jone Baseman CMA (Mar 07, 2010 10:28 AM) CC: F/U rIGHT ARM Is Patient Diabetic? No Pain Assessment Patient in pain? yes     Location: RIGHT ARM Intensity: 8   CC:  F/U rIGHT ARM.  History of Present Illness: 1. neck/ R shoulder pain  Went to UC 4/21 and was given injection of toradol and a medrol dose pack. Injection helped more than the steroids. Now back to baseline with her pain. Notices the pain more in the shoulder and lateral R arm. Worse in the daytime during activities than at night. Some pain at rest. Worse with any movements that require sustained/repetitive abduction of the arm.   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 Onset of seems a year or two after stopping this job.   taking diclofenac but noticing "cramps" in her stomach with this.   has taken hydrocodone in the past which helped. Aleve helps but only temporarily. Tylenol #3 causes nausea. Has taken flexeril in the past but this wasn't much help either.   PMHx: depression, bipolar, HTN  Habits & Providers  Alcohol-Tobacco-Diet     Tobacco Status: never  Current Medications (verified): 1)  Hydrochlorothiazide 25 Mg Tabs (Hydrochlorothiazide) .... One Tab By Mouth Daily For Blood Pressure 2)  Vistaril 50 Mg Caps (Hydroxyzine Pamoate) .... One Capsule Every 6 Hours 3)  Toprol Xl 25 Mg Xr24h-Tab (Metoprolol Succinate) .... One Tab By Mouth Daily For Blood Pressure 4)  Pristiq 50 Mg Xr24h-Tab (Desvenlafaxine Succinate) .... One Tab By Mouth Daily For Depression 5)  Lamotrigine 200 Mg Tabs (Lamotrigine) .... One Tab By Mouth Every Morning 6)   Zofran 8 Mg Tabs (Ondansetron Hcl) .... One Tab By Mouth Two Times A Day Prn 7)  Prevacid 30 Mg Cpdr (Lansoprazole) .... One By Mouth Daily For Reflux 8)  Lamotrigine 25 Mg Tabs (Lamotrigine) .... Take 2 Tabs Daily For 2 Weeks; Then 4 Tabs Daily Signs 2 Weeks; Then Switch To 200 Mg Dose 9)  D3-50 50000 Unit Caps (Cholecalciferol) .... One By Mouth Once A Week For 8 Weeks 10)  Calcium Carbonate-Vitamin D 600-400 Mg-Unit Tabs (Calcium Carbonate-Vitamin D) .... One By Mouth Two Times A Day With Meals 11)  Gabapentin 300 Mg Caps (Gabapentin) .... One By Mouth Three Times A Day For Neck/shoulder/arm Pain 12)  Robaxin 500 Mg Tabs (Methocarbamol) .... One By Mouth Up To 4 Times A Day  Allergies (verified): No Known Drug Allergies  Social History: no cigarettes; occasional THC; 1-2 mixed drinks per week.  Exercises about 4 x per week with walking. Lives with 2 dtrs (13 and 9). Not working right now. 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  Review of Systems       no vomiting  Physical Exam  General:  Vital signs reviewed -- obese but otherwise normal Alert, appropriate; well-dressed and well-nourished  Msk:  Neck: no c-spine tenderness with palpation, some paracervical tenderness but no spasm. Has limited ROM in all dimensions with pain  at extent of motion. Spurling's produces pain on the right side of the neck but no radicular symptoms.  R shoulder: painful abduction at about 90 degress both forward and lateral. Limted abduction to 120 degrees. Positive drop-arm. Further exam deferred due to pt discomfort. 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   Hands normal ROM; no joint swelling or warmth. Dulled sensation to light touch over R outer 1/2 5th digit but no other digits.  4/5 R grip strength, biceps, triceps; 5/5 on left.    Impression & Recommendations:  Problem # 1:  ARM PAIN, RIGHT (ICD-729.5) Assessment Unchanged  Recent neck / shoulder films  unremarkable. Diclofenac not helpful. Will start gabapentin and flexeril. Symptoms are more neuropathic in description but weakness and exam indicate possible rotator cuff / AC joint pathology. Diffusely tender to palpation. Will refer to sports medicine for possible ultrasound, further recommendations.   Orders: [facility]- Est  Level 4 (16109) Sports Medicine (Sports Med)  Complete Medication List: 1)  Hydrochlorothiazide 25 Mg Tabs (Hydrochlorothiazide) .... One tab by mouth daily for blood pressure 2)  Vistaril 50 Mg Caps (Hydroxyzine pamoate) .... One capsule every 6 hours 3)  Toprol Xl 25 Mg Xr24h-tab (Metoprolol succinate) .... One tab by mouth daily for blood pressure 4)  Pristiq 50 Mg Xr24h-tab (Desvenlafaxine succinate) .... One tab by mouth daily for depression 5)  Lamotrigine 200 Mg Tabs (Lamotrigine) .... One tab by mouth every morning 6)  Zofran 8 Mg Tabs (Ondansetron hcl) .... One tab by mouth two times a day prn 7)  Prevacid 30 Mg Cpdr (Lansoprazole) .... One by mouth daily for reflux 8)  Lamotrigine 25 Mg Tabs (Lamotrigine) .... Take 2 tabs daily for 2 weeks; then 4 tabs daily signs 2 weeks; then switch to 200 mg dose 9)  D3-50 50000 Unit Caps (Cholecalciferol) .... One by mouth once a week for 8 weeks 10)  Calcium Carbonate-vitamin D 600-400 Mg-unit Tabs (Calcium carbonate-vitamin d) .... One by mouth two times a day with meals 11)  Gabapentin 300 Mg Caps (Gabapentin) .... One by mouth three times a day for neck/shoulder/arm pain 12)  Robaxin 500 Mg Tabs (Methocarbamol) .... One by mouth up to 4 times a day  Patient Instructions: 1)  stop the diclofenac 2)  take the muscle relaxer and gabapentin. 3)  do the exercises to help improve strength and range of motion; you pain shouldn't get above 3/10. 4)  call [phone] to scedule an appointment with our sports medicine clinic 5)  follow-up with me in 1-2 months. Prescriptions: ROBAXIN 500 MG TABS (METHOCARBAMOL) one by mouth up to 4  times a day  #120 x 0   Entered and Authorized by:   Myrtie Soman  MD   Signed by:   Myrtie Soman  MD on 03/07/2010   Method used:   Electronically to        [facility] [address] [phone]* (retail)       [address]       [place], [place]  [address]       Ph: [phone]       Fax: [phone]   RxID:   [id number] GABAPENTIN 300 MG CAPS (GABAPENTIN) one by mouth three times a day for neck/shoulder/arm pain  #90 x 1   Entered and Authorized by:   Myrtie Soman  MD   Signed by:   Myrtie Soman  MD on 03/07/2010   Method used:   Electronically to  [facility]  [address] [phone]* (retail)       [address]       [place], [place]  [address]       Ph: [phone]       Fax: [phone]   RxID:   [id number]

## 2010-12-08 NOTE — Assessment & Plan Note (Signed)
Summary: R ARM/SHOULDER PAIN,R KNEE PAIN,[facility]   Vital Signs:  Patient profile:   45 year old female BP sitting:   107 / 73  Vitals Entered By: Lillia Pauls CMA (April 11, 2010 3:54 PM)  History of Present Illness: Right arm and hand pain:  1)hand pain for 20 years. diffuse, no numbness or tingling  2) Now having Shooting pain up Rt arm and across back. Still using Rt arm but hurts more in day. Using TENS unit, and using Robaxin and Gabapentin for > 1 momth. Not helping. Sometimes uses TENS unit all day. It does help dull the pain some. Pt can not do her childrens hair without having pain. No injury, no car accidents no other causes that she can think of. Can't hold 71 month old and can't hold him on the Rt side because her arm will "collapse".   Allergies: No Known Drug Allergies  Physical Exam  General:  overweight-appearing.   Msk:  Rotator cuff muscles have intact strength, pain with supraspinatus and IR testing.  Bicep tendon nontender, intact bicep strength   Distally her grip strength, finger adduction / abduction normal. Additional Exam:  Patient given informed consent for injection. Discussed possible complications of infection, bleeding or skin atrophy at site of injection. Possible side effect of avascular necrosis (focal area of bone death) due to steroid use.Appropriate verbal time out taken Are cleaned and prepped in usual sterile fashion. A --1-- cc kennalog plus --4--cc 1% lidocaine without epinephrine was injected into the--right subacromial bursa-. Patient tolerated procedure well with no complications.    Impression & Recommendations:  Problem # 1:  ROTATOR CUFF SYNDROME, RIGHT (ICD-726.10)  injection therapy HEP given in HO form and explained rtc prn  Orders: Joint Aspirate / Injection, Large (84696) Kenalog 10 mg inj (E9528)  Complete Medication List: 1)  Hydrochlorothiazide 25 Mg Tabs (Hydrochlorothiazide) .... One tab by mouth daily for blood  pressure 2)  Vistaril 50 Mg Caps (Hydroxyzine pamoate) .... One capsule every 6 hours 3)  Toprol Xl 25 Mg Xr24h-tab (Metoprolol succinate) .... One tab by mouth daily for blood pressure 4)  Pristiq 50 Mg Xr24h-tab (Desvenlafaxine succinate) .... One tab by mouth daily for depression 5)  Lamotrigine 200 Mg Tabs (Lamotrigine) .... One tab by mouth every morning 6)  Zofran 8 Mg Tabs (Ondansetron hcl) .... One tab by mouth two times a day prn 7)  Prevacid 30 Mg Cpdr (Lansoprazole) .... One by mouth daily for reflux 8)  Lamotrigine 25 Mg Tabs (Lamotrigine) .... Take 2 tabs daily for 2 weeks; then 4 tabs daily signs 2 weeks; then switch to 200 mg dose 9)  D3-50 50000 Unit Caps (Cholecalciferol) .... One by mouth once a week for 8 weeks 10)  Calcium Carbonate-vitamin D 600-400 Mg-unit Tabs (Calcium carbonate-vitamin d) .... One by mouth two times a day with meals 11)  Gabapentin 300 Mg Caps (Gabapentin) .... One by mouth three times a day for neck/shoulder/arm pain 12)  Robaxin 500 Mg Tabs (Methocarbamol) .... One by mouth up to 4 times a day

## 2010-12-08 NOTE — Assessment & Plan Note (Signed)
Summary: fu visit/bmc   Vital Signs:  Patient profile:   45 year old female Height:      65 inches Weight:      290.8 pounds BMI:     48.57 Temp:     98.8 degrees F oral Pulse rate:   79 / minute BP sitting:   133 / 85  (left arm) Cuff size:   regular  Vitals Entered By: Garen Grams LPN (June 22, 2010 4:12 PM)  CC: f/u meds, knee pain Is Patient Diabetic? No Pain Assessment Patient in pain? yes     Location: knees   Primary Care Oda Placke:  Antoine Primas DO  CC:  f/u meds and knee pain.  History of Present Illness: 45 yo female with  1.  Bipolar dx- has been on pristiq and lamictal for sometime now, been relatively stable with no depressive type symptoms or manic episodes, pt does have some anxiety from time to time that is relieved with vistaril well. Pt though would like to have someone to talk to.  Has not had a pyschiatrist since moving to [place].  2.  Weight-  Still gaingin weight knows she is not eating well, not exercising as much as she should mostly because of recent knee pain.  Has done some nutrition couseling in the paast but would like to put that on hold for now.   3. inosmnia-  Only sleeps 4-7 hours at night, does not feel well rested. Some of this is due to the child she is raising at this moment. Pt has tried Ambien 10mg  at bedtime with no help and is wondering what else.  Pt states the trouble is falling asleepp not staying asleep.   4.  Knee pain-  Right knee, hrts more on the inside gies hx of crepitus. Does not seem to swell, stiff when get up and then will lossen up within 5-10 minutes from moving, a little more pain with stairs pain does not radiate down leg or to hip, no back pain associated with it.     5.  HTN  Seemed to be doing well, has not had too much problem with side effects from medication..  Pt denies ha, visual changes abdominal pain, numbness or tingling in arms or legs.   Habits & Providers  Alcohol-Tobacco-Diet     Tobacco Status:  never  Current Medications (verified): 1)  Hydrochlorothiazide 25 Mg Tabs (Hydrochlorothiazide) .... One Tab By Mouth Daily For Blood Pressure 2)  Vistaril 50 Mg Caps (Hydroxyzine Pamoate) .... One Capsule Every 6 Hours 3)  Toprol Xl 25 Mg Xr24h-Tab (Metoprolol Succinate) .... One Tab By Mouth Daily For Blood Pressure 4)  Pristiq 50 Mg Xr24h-Tab (Desvenlafaxine Succinate) .... One Tab By Mouth Daily For Depression 5)  Lamotrigine 200 Mg Tabs (Lamotrigine) .... One Tab By Mouth Every Morning 6)  Zofran 8 Mg Tabs (Ondansetron Hcl) .... One Tab By Mouth Two Times A Day Prn 7)  Prevacid 30 Mg Cpdr (Lansoprazole) .... One By Mouth Daily For Reflux 8)  D3-50 50000 Unit Caps (Cholecalciferol) .... One By Mouth Once A Week For 8 Weeks 9)  Calcium Carbonate-Vitamin D 600-400 Mg-Unit Tabs (Calcium Carbonate-Vitamin D) .... One By Mouth Two Times A Day With Meals 10)  Nortriptyline Hcl 25 Mg Caps (Nortriptyline Hcl) .... Take 1 Pill At Night  Allergies (verified): No Known Drug Allergies  Past History:  Past medical, surgical, family and social histories (including risk factors) reviewed, and no changes noted (except as noted below).  Past Medical History: Reviewed history from 01/17/2010 and no changes required. depression - diagnosed  ~ 2000 bipolar disorder  insomnia - has tried trazodone, lunesta, rozerem HTN h/o low heart rate Raynaud's phenomenon  Past Surgical History: Reviewed history from 01/17/2010 and no changes required. CS x 2 1997, 2002 BTL 2002 heart cath in [place] 2006 -- no disease per the patient GB removed  ~ 2007 arthroscopic knee surgery 11/05 tonils and adenoids out as a shild  Family History: Reviewed history from 01/17/2010 and no changes required. brother - HIV, brother- hearing loss,  father - deceased, DM,  mother - CAD, MI at age 1, sister -  passed away from cardiac abnorm, rheumatic dz, obesity  Social History: Reviewed history from 03/07/2010 and no  changes required. no cigarettes; occasional THC; 1-2 mixed drinks per week.  Exercises about 4 x per week with walking. Lives with 2 dtrs (13 and 9). Not working right now. 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  Review of Systems       see hpi  Physical Exam  General:  obese. VS otherwise normal Eyes:  PERRLA, EOMI Mouth:  oropharynx pink, moist; no erythema or exudate  Lungs:  work of breathing unlabored, clear to auscultation bilaterally; no wheezes, rales, or ronchi; good air movement throughout  Heart:  regular rate and rhythm, no murmurs; normal s1/s2  Abdomen:  BS+, NT obese Msk:  right knee-- crepitus non tender on joint line pt patella does track laterally vmo only fires weakly compared to contralateral side. no gross deformity, anterior and posterior drawer test negative.  Pulses:  2+ Extremities:  trace edema   Impression & Recommendations:  Problem # 1:  BIPOLAR AFFECTIVE DISORDER, DEPRESSED (ICD-296.50) Pt has been on lamictal for some time now, seems doing well with no real signs of depression or manic episodes, did not do PHQ 9 but did not seem depressed at all, no side effects.  Pt also on pristiq.  Pt is being started on nortriptilline to help with sleep, HA, chronic pain so will watch for any signs of interaction.  Will see if improve in 2-3 weeks.  Pt also refered to [facility] that may help with a more psychosocial approach to the disease.  Orders: [facility]- Est  Level 4 (04540)  Problem # 2:  KNEE PAIN (ICD-719.46) Seems to be VMO instability and tracking problems, will give exercises and refer to PT to help strengthen muscle.  Told to wear better shoes when walking longer distance, given exercises and gieven some percocet as needed only for a few days.  Stopped tramadol and flexaril for now. If does not improve with hx of shoulder pains may need to do more testing for rheumatolgic dx but is unlikely.  Weight loss would help.  The following medications were removed from  the medication list:    Tramadol Hcl 50 Mg Tabs (Tramadol hcl) .Marland Kitchen... 1-2 tabs by mouth every 6 hours as needed    Flexeril 5 Mg Tabs (Cyclobenzaprine hcl) ..... One tab by mouth three times a day as  need for pain/spasm  Orders: [facility]- Est  Level 4 ([address]) Physical Therapy Referral (PT)  Problem # 3:  INSOMNIA (ICD-780.52) will try nortriptilline. Will see if improve pt states ambien and other sleep aids tried in the past did not help. Will f/u in 2-3 weeks.  Orders: [facility]- Est  Level 4 ([address]782)  Problem # 4:  OBESITY, NOS (ICD-278.00) Pt is gaining some weight, likley has not been exercising  as much due to knee pain, will attempt to walk more and then use a gym membership, pt goal next visit is  Orders: [facility]- Est  Level 4 ([address])  Problem # 5:  ESSENTIAL HYPERTENSION, BENIGN (ICD-401.1)  at goal ,will continue same regimen for now.  Her updated medication list for this problem includes:    Hydrochlorothiazide 25 Mg Tabs (Hydrochlorothiazide) ..... One tab by mouth daily for blood pressure    Toprol Xl 25 Mg Xr24h-tab (Metoprolol succinate) ..... One tab by mouth daily for blood pressure  BP today: 133/85 Prior BP: 114/75 (04/29/2010)  Labs Reviewed: K+: 3.2 (01/27/2010) Creat: : 0.71 (01/27/2010)   Chol: 157 (01/27/2010)   HDL: 47 (01/27/2010)   LDL: 96 (01/27/2010)   TG: 69 (01/27/2010)  Complete Medication List: 1)  Hydrochlorothiazide 25 Mg Tabs (Hydrochlorothiazide) .... One tab by mouth daily for blood pressure 2)  Vistaril 50 Mg Caps (Hydroxyzine pamoate) .... One capsule every 6 hours 3)  Toprol Xl 25 Mg Xr24h-tab (Metoprolol succinate) .... One tab by mouth daily for blood pressure 4)  Pristiq 50 Mg Xr24h-tab (Desvenlafaxine succinate) .... One tab by mouth daily for depression 5)  Lamotrigine 200 Mg Tabs (Lamotrigine) .... One tab by mouth every morning 6)  Zofran 8 Mg Tabs (Ondansetron hcl) .... One tab by mouth two times a day prn 7)  Prevacid 30 Mg Cpdr (Lansoprazole)  .... One by mouth daily for reflux 8)  D3-50 50000 Unit Caps (Cholecalciferol) .... One by mouth once a week for 8 weeks 9)  Calcium Carbonate-vitamin D 600-400 Mg-unit Tabs (Calcium carbonate-vitamin d) .... One by mouth two times a day with meals 10)  Nortriptyline Hcl 25 Mg Caps (Nortriptyline hcl) .... Take 1 pill at night  Patient Instructions: 1)  It is good to see you 2)  For your pain, innsomnia and anxiety I am going to start nortyrptylline.  Take 1 pill at night. It will make you a little sleepy which would be good. Try to limit the hydroxizine while taking this medication and stop the tramadol.  3)  Do the exercises I showed you for the knee 2 times a day 4)  I will refer you to physical therapy 5)  For your bi polar disease, I want you to make an appointment with Dr. Pascal Lux and see her in mood clinic.  I think this will be very helpful.  6)  Keep taking all other medications.  7)  I want to see you again in 2-3weeks to make sure this medication is helping and you knee is getting better Prescriptions: CALCIUM CARBONATE-VITAMIN D 600-400 MG-UNIT TABS (CALCIUM CARBONATE-VITAMIN D) one by mouth two times a day with meals  #60 x 3   Entered and Authorized by:   Antoine Primas DO   Signed by:   Antoine Primas DO on 06/22/2010   Method used:   Electronically to        [facility] [phone]* (retail)       [address]       [place], [place]  [address]       Ph: [phone]       Fax: [phone]   RxID:   [id number] PREVACID 30 MG CPDR (LANSOPRAZOLE) one by mouth daily for reflux  #90 x 1   Entered and Authorized by:   Antoine Primas DO   Signed by:   Antoine Primas DO on 06/22/2010   Method used:   Electronically to  [facility]  [address] [phone]* (retail)       [address]       [place], [place]  [address]       Ph: [phone]       Fax: [phone]   RxID:   [id number] LAMOTRIGINE 200 MG TABS (LAMOTRIGINE) one tab by mouth every morning  #90 x 1   Entered  and Authorized by:   Antoine Primas DO   Signed by:   Antoine Primas DO on 06/22/2010   Method used:   Electronically to        [facility] [phone]* (retail)       [address]       [place], [place]  [address]       Ph: [phone]       Fax: [phone]   RxID:   [id number] PRISTIQ 50 MG XR24H-TAB (DESVENLAFAXINE SUCCINATE) one tab by mouth daily for depression  #90 x 1   Entered and Authorized by:   Antoine Primas DO   Signed by:   Antoine Primas DO on 06/22/2010   Method used:   Electronically to        [facility] [phone]* (retail)       [address]       [place], [place]  [address]       Ph: [phone]       Fax: [phone]   RxID:   [id number] TOPROL XL 25 MG XR24H-TAB (METOPROLOL SUCCINATE) one tab by mouth daily for blood pressure  #31 x 1   Entered and Authorized by:   Antoine Primas DO   Signed by:   Antoine Primas DO on 06/22/2010   Method used:   Electronically to        [facility] [phone]* (retail)       [address]       [place], [place]  [address]       Ph: [phone]       Fax: [phone]   RxID:   [id number] HYDROCHLOROTHIAZIDE 25 MG TABS (HYDROCHLOROTHIAZIDE) one tab by mouth daily for blood pressure  #90 x 1   Entered and Authorized by:   Antoine Primas DO   Signed by:   Antoine Primas DO on 06/22/2010   Method used:   Electronically to        [facility] [phone]* (retail)       [address]       [place], [place]  [address]       Ph: [phone]       Fax: [phone]   RxID:   [id number] NORTRIPTYLINE HCL 25 MG CAPS (NORTRIPTYLINE HCL) take 1 pill at night  #31 x 2   Entered and Authorized by:   Antoine Primas DO   Signed by:   Antoine Primas DO on 06/22/2010   Method used:   Electronically to        [facility] [phone]* (retail)       [address]       [place], [place]  [address]       Ph: [phone]       Fax: [phone]   RxID:   [id number]   Prevention & Chronic  Care Immunizations   Influenza vaccine: Not documented   Influenza vaccine due: 07/07/2010    Tetanus booster: 12/07/2000: Done.    Pneumococcal vaccine: Not documented  Other Screening   Pap smear: normal  (  11/06/2009)    Mammogram: normal  (12/09/2007)   Mammogram due: 12/08/2009   Smoking status: never  (06/22/2010)  Lipids   Total Cholesterol: 157  (01/27/2010)   LDL: 96  (01/27/2010)   LDL Direct: Not documented   HDL: 47  (01/27/2010)   Triglycerides: 69  (01/27/2010)  Hypertension   Last Blood Pressure: 133 / 85  (06/22/2010)   Serum creatinine: 0.71  (01/27/2010)   Serum potassium 3.2  (01/27/2010)    Hypertension flowsheet reviewed?: Yes   Progress toward BP goal: At goal    Stage of readiness to change (hypertension management): Maintenance  Self-Management Support :   Personal Goals (by the next clinic visit) :      Personal blood pressure goal: 140/90  (02/18/2010)   Hypertension self-management support: BP self-monitoring log, Written self-care plan  (06/22/2010)   Hypertension self-care plan printed.    Hypertension self-management support not done because: Good outcomes  (02/18/2010)

## 2010-12-08 NOTE — Progress Notes (Signed)
Summary: Schedule MDC   Phone Note Call from Patient   Caller: Patient Call For: Spero Geralds, Psy.D. Summary of Call: Patient called to schedule an appt for [facility].  First available was October 26th at 10:45. Initial call taken by: Spero Geralds PsyD,  July 19, 2010 11:13 AM

## 2010-12-08 NOTE — Assessment & Plan Note (Signed)
Summary: NP,tcb   Vital Signs:  Patient profile:   45 year old female Height:      65 inches Weight:      271.5 pounds BMI:     45.34 Temp:     97.7 degrees F oral Pulse rate:   77 / minute Pulse rhythm:   regular BP sitting:   130 / 84  (left arm) Cuff size:   large  Vitals Entered By: Loralee Pacas CMA (January 17, 2010 10:26 AM)  History of Present Illness: 1. depression/bipolar States that this is not doing well off her medication. Sometimes has racing thoughts, often has difficulty sleeping. No prolonged periods without sleep. Sometimes does have prolonged periods of increased activity. In between these often has depressed mood.  Reports an overdose of sleeping pills at age 17 and at age in mid-20s. No hospitalizations for pysch-related issues Reports last SI in  2007.  2. insomnia States that she often has difficulty sleeping due to insomnia. Has tried trazodone, lunesta, rozerem and one other medicine that she can't remember. Ambien helps but has not been effective of late. Is out of this medication.  3. Arm, hand pain Has had several months of hand, elbow and shoulder pain that seems to be aggravated by activities involving repetitive fine motor skills (e.g. braiding hair, certain jobs).   4. HTN States that she has edema in her legs. Has only had elevated BP in the last year or so.  5. Obesity Has gained about 50-60 lbs in the last 1-2 years. Does not watch her diet closely. Thinks she had  lipid panel done in 2007 that was good. Has not had these labs repeated since.   -  Date:  11/06/2009    PAP normal  Date:  04/06/2009    Colonoscopy normal  Date:  12/09/2007    Mammogram normal  Current Medications (verified): 1)  Flexeril 10 Mg Tabs (Cyclobenzaprine Hcl) .... One Tab By Mouth Three Times A Day 2)  Hydrochlorothiazide 25 Mg Tabs (Hydrochlorothiazide) .... One Tab By Mouth Daily For Blood Pressure 3)  Vistaril 50 Mg Caps (Hydroxyzine Pamoate) .... One  Capsule Every 6 Hours 4)  Toprol Xl 25 Mg Xr24h-Tab (Metoprolol Succinate) .... One Tab By Mouth Daily For Blood Pressure 5)  Pristiq 50 Mg Xr24h-Tab (Desvenlafaxine Succinate) .... One Tab By Mouth Daily For Depression 6)  Ambien 10 Mg Tabs (Zolpidem Tartrate) .... One Tab By Mouth At Bedtime 7)  Lamotrigine 200 Mg Tabs (Lamotrigine) .... One Tab By Mouth Every Morning 8)  Reglan 10 Mg Tabs (Metoclopramide Hcl) .... One Tab Two Times A Day 9)  Zofran 8 Mg Tabs (Ondansetron Hcl) .... One Tab By Mouth Two Times A Day 10)  Prevacid 30 Mg Cpdr (Lansoprazole) .... One By Mouth Daily For Reflux  Allergies (verified): No Known Drug Allergies  Past History:  Past Medical History: depression - diagnosed  ~ 2000 bipolar disorder  insomnia - has tried trazodone, lunesta, rozerem HTN h/o low heart rate Raynaud's phenomenon  Past Surgical History: CS x 2 1997, 2002 BTL 2002 heart cath in [place] 2006 -- no disease per the patient GB removed  ~ 2007 arthroscopic knee surgery 11/05 tonils and adenoids out as a shild  Family History: brother - HIV, brother- hearing loss,  father - deceased, DM,  mother - CAD, MI at age 41, sister -  passed away from cardiac abnorm, rheumatic dz, obesity  Social History: no cigarettes; occasional THC; 1-2 mixed drinks per week.  Exercises about 4 x per week with walking. Lives with 2 dtrs (13 and 9). Not working right now.   Physical Exam  General:  Vital signs reviewed -- obese but otherwise normal Alert, appropriate; well-dressed and well-nourished  Mouth:  oropharynx pink, moist; no erythema or exudate  Lungs:  work of breathing unlabored, clear to auscultation bilaterally; no wheezes, rales, or ronchi; good air movement throughout  Heart:  regular rate and rhythm, no murmurs; normal s1/s2  Extremities:  trace BLE edema Neurologic:  alert and oriented. speech normal. station and gait normal. no gross deficitis.  Skin:  warm, good turgor; no rashes or  lesions.  Psych:  alert and oriented. full affect, normally interactive. Good eye contact. Speech normal rate. Not grandiose. No evidence of AVH. Denies SI.    Impression & Recommendations:  Problem # 1:  BIPOLAR AFFECTIVE DISORDER, DEPRESSED (ICD-296.50) Assessment New symptoms consistent with bipolar affective d/o with primarily depressed mood. Accordingly will try to get pt back on lamotrigine and up-titrate to previous dose of 200 mg daily over the next 4 weeks. May consider mood d/o clinic appointment. No SI currently. Start desvenlafaxine as well. [facility].   Problem # 2:  INSOMNIA (ICD-780.52) Assessment: New will hold off on refilling ambien at this time as pt has been off for several weeks. Treat bipolar d/o and follow. Try to discuss sleep hygeine at future visit.  The following medications were removed from the medication list:    Ambien 10 Mg Tabs (Zolpidem tartrate) ..... One tab by mouth at bedtime  Problem # 3:  ARM PAIN, RIGHT (ICD-729.5) Assessment: New advised ibuprofen as needed for now. Readdress at follow-up in one month.  Problem # 4:  ESSENTIAL HYPERTENSION, BENIGN (ICD-401.1) Assessment: New BP controlled today. Have back for labs in the next 1-2 weeks. Refilled HCTZ today. Her updated medication list for this problem includes:    Hydrochlorothiazide 25 Mg Tabs (Hydrochlorothiazide) ..... One tab by mouth daily for blood pressure    Toprol Xl 25 Mg Xr24h-tab (Metoprolol succinate) ..... One tab by mouth daily for blood pressure  Problem # 5:  OBESITY, NOS (ICD-278.00) Assessment: Unchanged Have back for labs. Consider nutrition c/s at next visit.  Future Orders: Lipid-[facility] ([id number]) ... 01/18/2011 Comp Met-[facility] ([id number]) ... 01/18/2011 CBC-[facility] (62952) ... 01/18/2011 TSH-[facility] [id number]) ... 01/18/2011 Vit D, 25 [place]-[facility] ([id number]) ... 01/18/2011  Complete Medication List: 1)  Flexeril 10 Mg Tabs  (Cyclobenzaprine hcl) .... One tab by mouth three times a day 2)  Hydrochlorothiazide 25 Mg Tabs (Hydrochlorothiazide) .... One tab by mouth daily for blood pressure 3)  Vistaril 50 Mg Caps (Hydroxyzine pamoate) .... One capsule every 6 hours 4)  Toprol Xl 25 Mg Xr24h-tab (Metoprolol succinate) .... One tab by mouth daily for blood pressure 5)  Pristiq 50 Mg Xr24h-tab (Desvenlafaxine succinate) .... One tab by mouth daily for depression 6)  Lamotrigine 200 Mg Tabs (Lamotrigine) .... One tab by mouth every morning 7)  Reglan 10 Mg Tabs (Metoclopramide hcl) .... One tab two times a day 8)  Zofran 8 Mg Tabs (Ondansetron hcl) .... One tab by mouth two times a day 9)  Prevacid 30 Mg Cpdr (Lansoprazole) .... One by mouth daily for reflux 10)  Lamotrigine 25 Mg Tabs (Lamotrigine) .... Take 2 tabs daily for 2 weeks; then 4 tabs daily signs 2 weeks; then switch to 200 mg dose  Patient Instructions: 1)  follow-up in the next 1-2 weeks for a  fastsing lab check (don't eat or drink anything before you come); we open at 8:30 am.  2)  follow-up with me in one month.  3)  If you have thoughts or hurting yourself, feel overwhelmed, or are interested in other resources, call [facility] at [phone].  Prescriptions: PRISTIQ 50 MG XR24H-TAB (DESVENLAFAXINE SUCCINATE) one tab by mouth daily for depression  #90 x 1   Entered and Authorized by:   Myrtie Soman  MD   Signed by:   Myrtie Soman  MD on 01/17/2010   Method used:   Electronically to        [facility] [address] [phone]* (retail)       [address]       [place], [place]  [address]       Ph: [phone]       Fax: [phone]   RxID:   [id number] HYDROCHLOROTHIAZIDE 25 MG TABS (HYDROCHLOROTHIAZIDE) one tab by mouth daily for blood pressure  #90 x 1   Entered and Authorized by:   Myrtie Soman  MD   Signed by:   Myrtie Soman  MD on 01/17/2010   Method used:   Electronically to        [facility] [address] [phone]*  (retail)       [address]       [place], [place]  [address]       Ph: [phone]       Fax: [phone]   RxID:   [id number] LAMOTRIGINE 200 MG TABS (LAMOTRIGINE) one tab by mouth every morning  #90 x 1   Entered and Authorized by:   Myrtie Soman  MD   Signed by:   Myrtie Soman  MD on 01/17/2010   Method used:   Electronically to        [facility] [address] [phone]* (retail)       [address]       [place], [place]  [address]       Ph: [phone]       Fax: [phone]   RxID:   [id number] LAMOTRIGINE 25 MG TABS (LAMOTRIGINE) take 2 tabs daily for 2 weeks; then 4 tabs daily signs 2 weeks; then switch to 200 mg dose  #120 x 0   Entered and Authorized by:   Myrtie Soman  MD   Signed by:   Myrtie Soman  MD on 01/17/2010   Method used:   Electronically to        [facility] [address] [phone]* (retail)       [address]       [place], [place]  [address]       Ph: [phone]       Fax: [phone]   RxID:   [id number]    Prevention & Chronic Care Immunizations   Influenza vaccine: Not documented    Tetanus booster: 12/07/2000: Done.    Pneumococcal vaccine: Not documented  Other Screening   Pap smear: normal  (11/06/2009)    Mammogram: normal  (12/09/2007)   Smoking status: Not documented  Lipids   Total Cholesterol: Not documented   LDL: Not documented   LDL Direct: Not documented   HDL: Not documented   Triglycerides: Not documented  Hypertension   Last Blood Pressure: 130 / 84  (01/17/2010)   Serum creatinine: Not documented   Serum potassium Not documented CMP ordered   Self-Management Support :    Hypertension self-management support: Not documented

## 2010-12-08 NOTE — Assessment & Plan Note (Signed)
Summary: RT KNEE CORTISONE SHOT/LP   Vital Signs:  Patient profile:   45 year old female Pulse rate:   65 / minute BP sitting:   124 / 85  (right arm)  Vitals Entered By: Rochele Pages RN (August 12, 2010 8:56 AM) CC: rt knee inj   CC:  rt knee inj.  Allergies: No Known Drug Allergies   Complete Medication List: 1)  Hydrochlorothiazide 25 Mg Tabs (Hydrochlorothiazide) .... One tab by mouth daily for blood pressure 2)  Vistaril 50 Mg Caps (Hydroxyzine pamoate) .... One capsule every 6 hours 3)  Toprol Xl 25 Mg Xr24h-tab (Metoprolol succinate) .... One tab by mouth daily for blood pressure 4)  Pristiq 50 Mg Xr24h-tab (Desvenlafaxine succinate) .... One tab by mouth daily for depression 5)  Lamotrigine 200 Mg Tabs (Lamotrigine) .... One tab by mouth every morning 6)  Zofran 8 Mg Tabs (Ondansetron hcl) .... One tab by mouth two times a day prn 7)  Prevacid 30 Mg Cpdr (Lansoprazole) .... One by mouth daily for reflux 8)  D3-50 50000 Unit Caps (Cholecalciferol) .... One by mouth once a week for 8 weeks 9)  Calcium Carbonate-vitamin D 600-400 Mg-unit Tabs (Calcium carbonate-vitamin d) .... One by mouth two times a day with meals 10)  Nortriptyline Hcl 25 Mg Caps (Nortriptyline hcl) .... Take 1 pill at night 11)  Vicodin 5-500 Mg Tabs (Hydrocodone-acetaminophen) .... Take 1-2 every 6 hours for pain 12)  Amoxicillin 500 Mg Tabs (Amoxicillin) .Marland Kitchen.. 1 tab by mouth two times a day for the next 10 days  Other Orders: Joint Aspirate / Injection, Large (20610) Kenalog 10 mg inj (J3301)  Appended Document: RT KNEE CORTISONE SHOT/LP    Clinical Lists Changes  Observations: Added new observation of PE COMMENTS: Patient given informed consent for injection. Discussed possible complications of infection, bleeding or skin atrophy at site of injection. Possible side effect of avascular necrosis (focal area of bone death) due to steroid use.Appropriate verbal time out taken Are cleaned and prepped  in usual sterile fashion. A ----1 cc kennalog plus ---4-cc 1% lidocaine without epinephrine was injected into the--right knee-. Patient tolerated procedure well with no complications.  (08/12/2010 9:56) Added new observation of MSK EXAM: B knees external changes of OA. Right knee TTP medial > lateral joint line. no effusion, no erythema or warmth. well healed circular dime shaped scar medial superior knee. calf is soft (08/12/2010 9:56) Added new observation of PEADULT: Denny Levy MD ~General`Gen appear ~Msk`MSK EXAM ~Additional Exam`PE comments (08/12/2010 9:56) Added new observation of GEN APPEAR: alert, well-developed, well-hydrated, and overweight-appearing.   (08/12/2010 9:56) Added new observation of ROS: The patient denies fever.   (08/12/2010 9:56) Added new observation of WJX:BJYNWGN: Denies fever (08/12/2010 9:56) Added new observation of MEDRECON: current updated (08/12/2010 9:56) Added new observation of HPI: knee shot we gave her last visit Left knee) made 100% improvement. Still some pain but so much better--less stiffness and pain. 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 she seems to have benefitted significantly. We discussed frequency of injection. We discussed weight loss.  Complete Medication List: 1)  Hydrochlorothiazide 25 Mg Tabs (Hydrochlorothiazide) .... One tab by mouth daily for blood pressure 2)  Vistaril 50 Mg Caps (Hydroxyzine pamoate) .... One capsule every 6 hours 3)  Toprol  Xl 25 Mg Xr24h-tab (Metoprolol succinate) .... One tab by mouth daily for blood pressure 4)  Pristiq 50 Mg Xr24h-tab (Desvenlafaxine succinate) .... One tab by mouth daily for depression 5)  Lamotrigine 200 Mg Tabs (Lamotrigine) .... One tab by mouth every morning 6)  Zofran 8 Mg  Tabs (Ondansetron hcl) .... One tab by mouth two times a day prn 7)  Prevacid 30 Mg Cpdr (Lansoprazole) .... One by mouth daily for reflux 8)  D3-50 50000 Unit Caps (Cholecalciferol) .... One by mouth once a week for 8 weeks 9)  Calcium Carbonate-vitamin D 600-400 Mg-unit Tabs (Calcium carbonate-vitamin d) .... One by mouth two times a day with meals 10)  Nortriptyline Hcl 25 Mg Caps (Nortriptyline hcl) .... Take 1 pill at night 11)  Vicodin 5-500 Mg Tabs (Hydrocodone-acetaminophen) .... Take 1-2 every 6 hours for pain 12)  Amoxicillin 500 Mg Tabs (Amoxicillin) .Marland Kitchen.. 1 tab by mouth two times a day for the next 10 days  Primary Care Provider:  Antoine Primas DO   History of Present Illness: knee shot we gave her last visit Left knee) made 100% improvement. Still some pain but so much better--less stiffness and pain. Wants to do the other knee   Current Medications (verified): 1)  Hydrochlorothiazide 25 Mg Tabs (Hydrochlorothiazide) .... One Tab By Mouth Daily For Blood Pressure 2)  Vistaril 50 Mg Caps (Hydroxyzine Pamoate) .... One Capsule Every 6 Hours 3)  Toprol Xl 25 Mg Xr24h-Tab (Metoprolol Succinate) .... One Tab By Mouth Daily For Blood Pressure 4)  Pristiq 50 Mg Xr24h-Tab (Desvenlafaxine Succinate) .... One Tab By Mouth Daily For Depression 5)  Lamotrigine 200 Mg Tabs (Lamotrigine) .... One Tab By Mouth Every Morning 6)  Zofran 8 Mg Tabs (Ondansetron Hcl) .... One Tab By Mouth Two Times A Day Prn 7)  Prevacid 30 Mg Cpdr (Lansoprazole) .... One By Mouth Daily For Reflux 8)  D3-50 50000 Unit Caps (Cholecalciferol) .... One By Mouth Once A Week For 8 Weeks 9)  Calcium Carbonate-Vitamin D 600-400 Mg-Unit Tabs (Calcium Carbonate-Vitamin D) .... One By Mouth Two Times A Day With Meals 10)  Nortriptyline Hcl 25 Mg Caps (Nortriptyline Hcl) .... Take 1 Pill At Night 11)  Vicodin 5-500 Mg Tabs (Hydrocodone-Acetaminophen) .... Take 1-2 Every 6 Hours For Pain 12)  Amoxicillin 500 Mg Tabs  (Amoxicillin) .Marland Kitchen.. 1 Tab By Mouth Two Times A Day For The Next 10 Days  Allergies: No Known Drug Allergies   Review of Systems  The patient denies fever.     Physical Exam  General:  alert, well-developed, well-hydrated, and overweight-appearing.   Msk:  B knees external changes of OA. Right knee TTP medial > lateral joint line. no effusion, no erythema or warmth. well healed circular dime shaped scar medial superior knee. calf is soft Additional Exam:  Patient given informed consent for injection. Discussed possible complications of infection, bleeding or skin atrophy at site of injection. Possible side effect of avascular necrosis (focal area of bone death) due to steroid use.Appropriate verbal time out taken Are cleaned and prepped in usual sterile fashion. A ----1 cc kennalog plus ---4-cc 1% lidocaine without epinephrine was injected into the--right knee-. Patient tolerated procedure well with no complications.

## 2010-12-08 NOTE — Assessment & Plan Note (Signed)
Summary: knee pain/[place]/unassigned   Vital Signs:  Patient profile:   45 year old female Height:      65 inches Weight:      289 pounds Temp:     98.1 degrees F oral Pulse rate:   87 / minute BP sitting:   137 / 87  Vitals Entered By: Jimmy Footman, CMA (July 19, 2010 1:51 PM) CC: knee pain x2 days Is Patient Diabetic? No Pain Assessment Patient in pain? yes     Location: rt knee Intensity: 10 Type: sharp   Primary Provider:  Antoine Primas DO  CC:  knee pain x2 days.  History of Present Illness: Pt is an obese 45 year old female with a history of chronic hip, knee, and back pain who presents today with a two week gradual worsening of her knee pain.  Se says that her pain is now significantly worse than her baseline and is significantly aggrevated by walking.  Her baseline pain is made somewhat better by walking.  She describes the pain as sharp and throbbing.  It is localized to her knees with no radiation.  She has been taking up to 1.5 gm of motrin every three hours for the pain with no effect.  It is now interfearing with her ability to sleep.  She does espouse some locking sensations in both knees, but denies any recent trauma to either joint.  Habits & Providers  Alcohol-Tobacco-Diet     Tobacco Status: never  Allergies: No Known Drug Allergies  Past History:  Past medical, surgical, family and social histories (including risk factors) reviewed for relevance to current acute and chronic problems.  Past Medical History: Reviewed history from 01/17/2010 and no changes required. depression - diagnosed  ~ 2000 bipolar disorder  insomnia - has tried trazodone, lunesta, rozerem HTN h/o low heart rate Raynaud's phenomenon  Past Surgical History: Reviewed history from 01/17/2010 and no changes required. CS x 2 1997, 2002 BTL 2002 heart cath in [place] 2006 -- no disease per the patient GB removed  ~ 2007 arthroscopic knee surgery 11/05 tonils and adenoids out as a  shild  Family History: Reviewed history from 01/17/2010 and no changes required. brother - HIV, brother- hearing loss,  father - deceased, DM,  mother - CAD, MI at age 43, sister -  passed away from cardiac abnorm, rheumatic dz, obesity  Social History: Reviewed history from 03/07/2010 and no changes required. no cigarettes; occasional THC; 1-2 mixed drinks per week.  Exercises about 4 x per week with walking. Lives with 2 dtrs (13 and 9). Not working right now. 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  Physical Exam  General:  obese. VS otherwise normal Lungs:  work of breathing unlabored, clear to auscultation bilaterally; no wheezes, rales, or ronchi; good air movement throughout  Heart:  regular rate and rhythm, no murmurs; normal s1/s2  Msk:  R-knee: pain to plapation both posteriorly and on the lateral and medial sides.  Diffuse tenderness in calf and thigh.  Mild crepitis, normal ROM, though with pain on movement, no swelling or warmth appreciated.  L-knee: pain to palpation posteriorly with diffuse calf and thigh tenderness.  Mild crepitis, inability to fully extend without significant pain.  Mild joint effusion appreciated but no warmth or swelling.   Impression & Recommendations:  Problem # 1:  KNEE PAIN (ICD-719.46) Knee pain has recently worsened without obvious inciting factor.  It is unclear if her pain is from meniscal injury, DJD/OA, or from some other source.  Will give her a short prescription of vicodin for pain control (it was made very clear to the patient that this was a one-time prescription) and obtain bilateral standing knee films.  If these show no obvious source for her pain we plan on referring her to sports-med for further workup.  Her updated medication list for this problem includes:    Vicodin 5-500 Mg Tabs (Hydrocodone-acetaminophen) .Marland Kitchen... Take 1-2 every 6 hours for pain  Orders: Basic Met-[facility] (09811-91478) Radiology other (Radiology Other) [facility]- Est Level   3 (29562)  Complete Medication List: 1)  Hydrochlorothiazide 25 Mg Tabs (Hydrochlorothiazide) .... One tab by mouth daily for blood pressure 2)  Vistaril 50 Mg Caps (Hydroxyzine pamoate) .... One capsule every 6 hours 3)  Toprol Xl 25 Mg Xr24h-tab (Metoprolol succinate) .... One tab by mouth daily for blood pressure 4)  Pristiq 50 Mg Xr24h-tab (Desvenlafaxine succinate) .... One tab by mouth daily for depression 5)  Lamotrigine 200 Mg Tabs (Lamotrigine) .... One tab by mouth every morning 6)  Zofran 8 Mg Tabs (Ondansetron hcl) .... One tab by mouth two times a day prn 7)  Prevacid 30 Mg Cpdr (Lansoprazole) .... One by mouth daily for reflux 8)  D3-50 50000 Unit Caps (Cholecalciferol) .... One by mouth once a week for 8 weeks 9)  Calcium Carbonate-vitamin D 600-400 Mg-unit Tabs (Calcium carbonate-vitamin d) .... One by mouth two times a day with meals 10)  Nortriptyline Hcl 25 Mg Caps (Nortriptyline hcl) .... Take 1 pill at night 11)  Vicodin 5-500 Mg Tabs (Hydrocodone-acetaminophen) .... Take 1-2 every 6 hours for pain Prescriptions: VICODIN 5-500 MG TABS (HYDROCODONE-ACETAMINOPHEN) Take 1-2 every 6 hours for pain  #40 x 0   Entered and Authorized by:   Majel Homer MD   Signed by:   Majel Homer MD on 07/19/2010   Method used:   Print then Give to Patient   RxID:   [id number]   Appended Document: knee pain/[place]/unassigned Will also obtain a BMET to assess if any kidney damage from reported high NSAID use.

## 2010-12-08 NOTE — Miscellaneous (Signed)
Summary: knee pain  Clinical Lists Changes has been to PT but her knees are very painful. hurts to walk or stand. took motrin 800 but did not help. was on her way to ED. asked her to come see [place] instead. she will be here at 1:30. aware of wait.[place] RN  July 19, 2010 11:11 AM

## 2010-12-08 NOTE — Assessment & Plan Note (Signed)
Summary: F/U BILAT KNEE PAIN/[facility]   Vital Signs:  Patient profile:   45 year old female Height:      65 inches Weight:      301.3 pounds BMI:     50.32 Temp:     97.7 degrees F oral Pulse rate:   73 / minute BP sitting:   118 / 76  (left arm) Cuff size:   large  Vitals Entered By: Garen Grams LPN (November 10, 2010 3:24 PM) CC: muscle cramps Is Patient Diabetic? No Pain Assessment Patient in pain? no        Primary Care Provider:  Antoine Primas DO  CC:  muscle cramps.  History of Present Illness: 45 yo female here for f/u on   1.  Depression-  Pt has been on the same medication for some time including the pristique and lamictal, ha sbeen doing very well but states for the last 2 months has been feeling much more down and not feeling well.  Pt states she had this problem once before and had to increase her pristique for some months and it made her feel much better.  Pt does have hx of bipolar but no manic episodes for quite sometime.  Pt denies suicidal ideation or Homicidal ideation   2.  Menorrhagia-  Pt has had hx of painful cramping and bleeding with periods.  Pt in the past has been diagnosed with fibroids.  They had been doing well but for the last 2 months has started to have much more pain with her periods and bleeding is going on for 5-6 days when usually only 4-5 days  Pt still regular every 28 days.  Pt in the past was on Depo but gained 78 pounds over 2 years.  Pt does not smoke.   3.  Knee pain-  Pt has had chronic knee pain and was talking about possibly having surgery done due to conservative medicine not helping but pt states she is still delaying at this time.   4.  Obesity-  Pt states the holidays were hard to lose weight and she did not do a great job.  She is going to try harder in the next several months.  She know this would help her knees and maybe her mood.    Habits & Providers  Alcohol-Tobacco-Diet     Tobacco Status: never  Current Medications  (verified): 1)  Hydrochlorothiazide 25 Mg Tabs (Hydrochlorothiazide) .... One Tab By Mouth Daily For Blood Pressure 2)  Vistaril 50 Mg Caps (Hydroxyzine Pamoate) .... One Capsule Every 6 Hours 3)  Toprol Xl 25 Mg Xr24h-Tab (Metoprolol Succinate) .... One Tab By Mouth Daily For Blood Pressure 4)  Pristiq 50 Mg Xr24h-Tab (Desvenlafaxine Succinate) .... One Tab By Mouth Daily For Depression 5)  Lamotrigine 200 Mg Tabs (Lamotrigine) .... One Tab By Mouth Every Morning 6)  Zofran 8 Mg Tabs (Ondansetron Hcl) .... One Tab By Mouth Two Times A Day Prn 7)  Prevacid 30 Mg Cpdr (Lansoprazole) .... One By Mouth Daily For Reflux 8)  D3-50 50000 Unit Caps (Cholecalciferol) .... One By Mouth Once A Week For 8 Weeks 9)  Calcium Carbonate-Vitamin D 600-400 Mg-Unit Tabs (Calcium Carbonate-Vitamin D) .... One By Mouth Two Times A Day With Meals 10)  Nortriptyline Hcl 25 Mg Caps (Nortriptyline Hcl) .... Take 1 Pill At Night 11)  Diclofenac Sodium 75 Mg Tbec (Diclofenac Sodium) .... Take 1 Tab By Mouth By Mouth Two Times A Day  Allergies (verified): No Known  Drug Allergies  Past History:  Past medical, surgical, family and social histories (including risk factors) reviewed, and no changes noted (except as noted below).  Past Medical History: Reviewed history from 01/17/2010 and no changes required. depression - diagnosed  ~ 2000 bipolar disorder  insomnia - has tried trazodone, lunesta, rozerem HTN h/o low heart rate Raynaud's phenomenon  Past Surgical History: Reviewed history from 01/17/2010 and no changes required. CS x 2 1997, 2002 BTL 2002 heart cath in [place] 2006 -- no disease per the patient GB removed  ~ 2007 arthroscopic knee surgery 11/05 tonils and adenoids out as a shild  Family History: Reviewed history from 01/17/2010 and no changes required. brother - HIV, brother- hearing loss,  father - deceased, DM,  mother - CAD, MI at age 41, sister -  passed away from cardiac abnorm,  rheumatic dz, obesity  Social History: Reviewed history from 03/07/2010 and no changes required. no cigarettes; occasional THC; 1-2 mixed drinks per week.  Exercises about 4 x per week with walking. Lives with 2 dtrs (13 and 9). Not working right now. 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  Review of Systems       Denies fever, chills, nausea, vomiting, diarrhea or constipation see hpi for rest  Physical Exam  General:  alert, well-developed, well-hydrated, and overweight-appearing.   Eyes:  PERRLA, EOMI Ears:  TM intact no bulging Mouth:  MMM Neck:  no LAD Lungs:  CTAB Heart:  regular rate and rhythm, no murmurs; normal s1/s2  Abdomen:  BS+, NT obese Msk:  B knees external changes of OA.  Right knee TTP medial > lateral joint line. no effusion, no erythema or warmth. well healed circular dime shaped scar medial superior knee. calf is soft.  Mild crepitus on movement.  PCL, ACL, MCL LCL intact  Left knee less significant findings then right knee but still has crepitus, and tender to palpation over the medial joint line  no change from previous exam Pulses:  2+ Psych:  good affect, not tearing during exam, good eye contact mildly more anxious than usual denies suicidal ideation or Homicidal ideation    Impression & Recommendations:  Problem # 1:  BIPOLAR AFFECTIVE DISORDER, DEPRESSED (ICD-296.50) Continue lamictal at the same dose for now will increase pristique to see if it will make a difference and see pt again in 4 weeks.  Pt underrstands possible side effects and when to seek medical attention.   Orders: [facility]- Est  Level 4 (04540)  Problem # 2:  OBESITY, NOS (ICD-278.00) pt seems a little less motivated at moment will readdress at next visit more thourghly but  Orders: [facility]- Est  Level 4 (98119)  Problem # 3:  DYSMENORRHEA (ICD-625.3) Pt given different choices of OCP, patch, IUD, depo again but would like to avoid anymore weight gain, hysterectomy or just symptomatic  relief with medications.  Pt chose symptomatic relief with NSAIDS for the next month and then will readdress at follow up visit. Pt is not blleding at this time and no signs of anemia.   Orders: [facility]- Est  Level 4 (14782)  Complete Medication List: 1)  Hydrochlorothiazide 25 Mg Tabs (Hydrochlorothiazide) .... One tab by mouth daily for blood pressure 2)  Vistaril 50 Mg Caps (Hydroxyzine pamoate) .... One capsule every 6 hours 3)  Toprol Xl 25 Mg Xr24h-tab (Metoprolol succinate) .... One tab by mouth daily for blood pressure 4)  Lamotrigine 200 Mg Tabs (Lamotrigine) .... One tab by mouth every morning 5)  Zofran 8 Mg Tabs (Ondansetron hcl) .... One tab by mouth two times a day prn 6)  Prevacid 30 Mg Cpdr (Lansoprazole) .... One by mouth daily for reflux 7)  D3-50 50000 Unit Caps (Cholecalciferol) .... One by mouth once a week for 8 weeks 8)  Calcium Carbonate-vitamin D 600-400 Mg-unit Tabs (Calcium carbonate-vitamin d) .... One by mouth two times a day with meals 9)  Nortriptyline Hcl 25 Mg Caps (Nortriptyline hcl) .... Take 1 pill at night 10)  Diclofenac Sodium 75 Mg Tbec (Diclofenac sodium) .... Take 1 tab by mouth by mouth two times a day 11)  Pristiq 100 Mg Xr24h-tab (Desvenlafaxine succinate) .Marland Kitchen.. 1 tab by mouth daily  Patient Instructions: 1)  Good to see you 2)  Lets decide on what to do about your painful periods after the next one.  You have choices of the IUD, hysterectomy or to continue to help just the symptoms 3)  We increased you pristiq to 100mg  daily 4)  I need to see you again in 2-3 months Prescriptions: PRISTIQ 100 MG XR24H-TAB (DESVENLAFAXINE SUCCINATE) 1 tab by mouth daily  #90 x 1   Entered and Authorized by:   Antoine Primas DO   Signed by:   Antoine Primas DO on 11/10/2010   Method used:   Electronically to        [facility] [address] [phone]* (retail)       [address]       [place], [place]  [address]       Ph: [phone]       Fax: [phone]   RxID:    [id number]    Orders Added: 1)  [facility]- Est  Level 4 [44010]

## 2010-12-08 NOTE — Assessment & Plan Note (Signed)
Summary: htn, tooth, knee pain   Vital Signs:  Patient profile:   45 year old female Height:      65 inches Weight:      293.8 pounds BMI:     49.07 Temp:     98.1 degrees F oral Pulse rate:   71 / minute BP sitting:   132 / 83  (left arm) Cuff size:   regular  Vitals Entered By: Garen Grams LPN (August 02, 2010 9:03 AM) CC: f/u knee pain Is Patient Diabetic? No Pain Assessment Patient in pain? yes     Location: knees/tooth   Primary Care Provider:  Antoine Primas DO  CC:  f/u knee pain.  History of Present Illness: 45 yo female here for f/u on knee pain  1.  Knee pain, vicoden did seem to help that was given to he rby Dr. Louanne Belton at last visit, pt though states that the pain does not seem to be getting bettter overall and the medicine just makes it tolerable.  Pt has had X-rays done which did not show any internal disarrangemet. Pt has been doing some of the exercise and has maybe helped a little.  Pt denies the knee giving out on her able to still walk worse a little bit with going up stairs.  Tried PT but could not tolerate it well. Would like to try something else if possible  2.  Obesity-  Pt has gained 3 # since last visit, is finding it hard to workout due to her knee.  Pt is not doing great with protion control and does not understand nutrition greatly.  Pt would lik to talk to someone if possible.    3. Preventitive care-  Pt needs a mamogram, always has been normal has been 2 years no breast lumps or masses felt by pt.   4.  BP today:132/83 Taking Meds:yes Side Effects:no ROS: denies Headache visual changes nausea vomiting abdominal pain numbness in extremities    5.  tooth pain-  Pt felt her tooth broke on righ upper mouth quite awhile ago and is now having severe pain with eating.  Pt has had a tooth abcess before and feels that this is the same. Pt denies any discharge, any fever, chills, nausea, vomiting, diarrhea or constipation   6.  Insomnia-  improved  some with medication getting about 7 hours assleep now and feels more restful in Am.  6.  bipolar disease.  Pt is scheduled to be seen in mood clinic at the end of next month.  Pt states she is doing very well has enough medication until then no feelings of depression no grandiose ideas otherwise.    Habits & Providers  Alcohol-Tobacco-Diet     Tobacco Status: never  Current Medications (verified): 1)  Hydrochlorothiazide 25 Mg Tabs (Hydrochlorothiazide) .... One Tab By Mouth Daily For Blood Pressure 2)  Vistaril 50 Mg Caps (Hydroxyzine Pamoate) .... One Capsule Every 6 Hours 3)  Toprol Xl 25 Mg Xr24h-Tab (Metoprolol Succinate) .... One Tab By Mouth Daily For Blood Pressure 4)  Pristiq 50 Mg Xr24h-Tab (Desvenlafaxine Succinate) .... One Tab By Mouth Daily For Depression 5)  Lamotrigine 200 Mg Tabs (Lamotrigine) .... One Tab By Mouth Every Morning 6)  Zofran 8 Mg Tabs (Ondansetron Hcl) .... One Tab By Mouth Two Times A Day Prn 7)  Prevacid 30 Mg Cpdr (Lansoprazole) .... One By Mouth Daily For Reflux 8)  D3-50 50000 Unit Caps (Cholecalciferol) .... One By Mouth Once A Week For  8 Weeks 9)  Calcium Carbonate-Vitamin D 600-400 Mg-Unit Tabs (Calcium Carbonate-Vitamin D) .... One By Mouth Two Times A Day With Meals 10)  Nortriptyline Hcl 25 Mg Caps (Nortriptyline Hcl) .... Take 1 Pill At Night 11)  Vicodin 5-500 Mg Tabs (Hydrocodone-Acetaminophen) .... Take 1-2 Every 6 Hours For Pain 12)  Amoxicillin 500 Mg Tabs (Amoxicillin) .Marland Kitchen.. 1 Tab By Mouth Two Times A Day For The Next 10 Days  Allergies (verified): No Known Drug Allergies  Past History:  Past medical, surgical, family and social histories (including risk factors) reviewed, and no changes noted (except as noted below).  Past Medical History: Reviewed history from 01/17/2010 and no changes required. depression - diagnosed  ~ 2000 bipolar disorder  insomnia - has tried trazodone, lunesta, rozerem HTN h/o low heart rate Raynaud's  phenomenon  Past Surgical History: Reviewed history from 01/17/2010 and no changes required. CS x 2 1997, 2002 BTL 2002 heart cath in [place] 2006 -- no disease per the patient GB removed  ~ 2007 arthroscopic knee surgery 11/05 tonils and adenoids out as a shild  Family History: Reviewed history from 01/17/2010 and no changes required. brother - HIV, brother- hearing loss,  father - deceased, DM,  mother - CAD, MI at age 76, sister -  passed away from cardiac abnorm, rheumatic dz, obesity  Social History: Reviewed history from 03/07/2010 and no changes required. no cigarettes; occasional THC; 1-2 mixed drinks per week.  Exercises about 4 x per week with walking. Lives with 2 dtrs (13 and 9). Not working right now. 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  Physical Exam  General:  obese. VS otherwise normal Eyes:  PERRLA, EOMI Mouth:  right upper 1st molar on right side has broken tooth with erythema surrounding it no frank pus notied.  Lungs:  work of breathing unlabored, clear to auscultation bilaterally; no wheezes, rales, or ronchi; good air movement throughout  Heart:  regular rate and rhythm, no murmurs; normal s1/s2  Abdomen:  BS+, NT obese Msk:  R-knee:    Mild crepitis, normal ROM, though with pain on movement, no swelling or warmth appreciated.  Pt has significant tracking problem of patella on right, no VMO firing apprecited at all.   L-knee: less crepitus bettrer but still lateral tracking of patella.   Pulses:  2+ dp Extremities:  trace edema to ankle b/l   Impression & Recommendations:  Problem # 1:  KNEE PAIN (ICD-719.46) Still seems to be deconditioning and pt is wearing sandles again today.  Told pt about proper footwear and the importance of doing the exercise on a daily process. Pt does not feel PT helped and actually caused more pain.  Pt would like to see sportsmedicine.  Will send to sportsmedicine to get their opinion. Will give a little more vicodin for now but  would not like to continue for a long time.  Her updated medication list for this problem includes:    Vicodin 5-500 Mg Tabs (Hydrocodone-acetaminophen) .Marland Kitchen... Take 1-2 every 6 hours for pain  Orders: [facility]- Est  Level 4 ([address]) Sports Medicine (Sports Med)  Problem # 2:  PREVENTIVE HEALTH CARE (ICD-V70.0) ordered mammogram.  Orders: Mammogram (Screening) (Mammo)  Problem # 3:  ESSENTIAL HYPERTENSION, BENIGN (ICD-401.1) at goal, continue current regimen.  Her updated medication list for this problem includes:    Hydrochlorothiazide 25 Mg Tabs (Hydrochlorothiazide) ..... One tab by mouth daily for blood pressure    Toprol Xl 25 Mg Xr24h-tab (Metoprolol succinate) ..... One tab by  mouth daily for blood pressure  Orders: [facility]- Est  Level 4 (04540) Nutrition Referral (Nutrition)  Problem # 4:  INSOMNIA (ICD-780.52) improved some, will continue current regimen.  Orders: [facility]- Est  Level 4 (98119)  Problem # 5:  OBESITY, NOS (ICD-278.00) Pt has gained 30# in the last 4 months. Pt has had trouble wit h portion control and food choices, very interested in a nutrition consult to help with weight loss. Wil lhave Dr. Gerilyn Pilgrim see pt. TSH was normal, no other cause for weight gain seen.  Orders: Nutrition Referral (Nutrition)  Problem # 6:  Tooth abcess Pt givn list of dentist taking medicaid.  Pt will make own appoinmnt.  Pt also given amoxicillin for 10 days to make sure infection does not spread, pt though will need to see a dentist soon.   Complete Medication List: 1)  Hydrochlorothiazide 25 Mg Tabs (Hydrochlorothiazide) .... One tab by mouth daily for blood pressure 2)  Vistaril 50 Mg Caps (Hydroxyzine pamoate) .... One capsule every 6 hours 3)  Toprol Xl 25 Mg Xr24h-tab (Metoprolol succinate) .... One tab by mouth daily for blood pressure 4)  Pristiq 50 Mg Xr24h-tab (Desvenlafaxine succinate) .... One tab by mouth daily for depression 5)  Lamotrigine 200 Mg Tabs (Lamotrigine) .... One tab  by mouth every morning 6)  Zofran 8 Mg Tabs (Ondansetron hcl) .... One tab by mouth two times a day prn 7)  Prevacid 30 Mg Cpdr (Lansoprazole) .... One by mouth daily for reflux 8)  D3-50 50000 Unit Caps (Cholecalciferol) .... One by mouth once a week for 8 weeks 9)  Calcium Carbonate-vitamin D 600-400 Mg-unit Tabs (Calcium carbonate-vitamin d) .... One by mouth two times a day with meals 10)  Nortriptyline Hcl 25 Mg Caps (Nortriptyline hcl) .... Take 1 pill at night 11)  Vicodin 5-500 Mg Tabs (Hydrocodone-acetaminophen) .... Take 1-2 every 6 hours for pain 12)  Amoxicillin 500 Mg Tabs (Amoxicillin) .Marland Kitchen.. 1 tab by mouth two times a day for the next 10 days  Patient Instructions: 1)  Good to see you 2)  I want you to make an appointment to see Sports medicine 3)  I want you to make an appoinment to see nutrition (Dr. Gerilyn Pilgrim) 4)  I am givng you more vicodin for now. 5)  I want you to get a mammogram 6)  I am giving an antibiotic for your tooth and going to give you a list of dentist.  7)  I need to see you again after you have seen sports medicine.  Probably in about 1 month.  Prescriptions: VICODIN 5-500 MG TABS (HYDROCODONE-ACETAMINOPHEN) Take 1-2 every 6 hours for pain  #40 x 0   Entered and Authorized by:   Antoine Primas DO   Signed by:   Antoine Primas DO on 08/02/2010   Method used:   Print then Give to Patient   RxID:   [id number] AMOXICILLIN 500 MG TABS (AMOXICILLIN) 1 tab by mouth two times a day for the next 10 days  #20 x 0   Entered and Authorized by:   Antoine Primas DO   Signed by:   Antoine Primas DO on 08/02/2010   Method used:   Electronically to        [facility] [phone]* (retail)       [address]       [place], [place]  [address]       Ph: [phone]       Fax: [phone]  RxID:   [id number]   Prevention & Chronic Care Immunizations   Influenza vaccine: Not documented   Influenza vaccine due: 07/07/2010    Tetanus booster: 12/07/2000:  Done.    Pneumococcal vaccine: Not documented  Other Screening   Pap smear: normal  (11/06/2009)    Mammogram: normal  (12/09/2007)   Mammogram due: 12/08/2009   Smoking status: never  (08/02/2010)  Lipids   Total Cholesterol: 157  (01/27/2010)   LDL: 96  (01/27/2010)   LDL Direct: Not documented   HDL: 47  (01/27/2010)   Triglycerides: 69  (01/27/2010)  Hypertension   Last Blood Pressure: 132 / 83  (08/02/2010)   Serum creatinine: 0.66  (07/19/2010)   Serum potassium 3.9  (07/19/2010)    Hypertension flowsheet reviewed?: Yes   Progress toward BP goal: At goal    Stage of readiness to change (hypertension management): Maintenance  Self-Management Support :   Personal Goals (by the next clinic visit) :      Personal blood pressure goal: 140/90  (02/18/2010)   Patient will work on the following items until the next clinic visit to reach self-care goals:     Medications and monitoring: take my medicines every day  (08/02/2010)   Referred.    Hypertension self-management support: BP self-monitoring log, Pre-printed educational material, Resources for patients handout  (08/02/2010)    Hypertension self-management support not done because: Good outcomes  (02/18/2010)      Resource handout printed.

## 2010-12-08 NOTE — Miscellaneous (Signed)
  received PA for celebrex. Will change to another NSAID. Called pt to make her aware. She is agreeable.  Clinical Lists Changes  Medications: Changed medication from CELEBREX 200 MG CAPS (CELECOXIB) one by mouth two times a day -- take with food to DICLOFENAC SODIUM 75 MG TBEC (DICLOFENAC SODIUM) one by mouth two times a day; take with food - Signed Rx of DICLOFENAC SODIUM 75 MG TBEC (DICLOFENAC SODIUM) one by mouth two times a day; take with food;  #60 x 0;  Signed;  Entered by: Myrtie Soman  MD;  Authorized by: Myrtie Soman  MD;  Method used: Electronically to [facility] [phone]*, [address] [place], [place]  [address], Ph: [phone], Fax: [phone]    Prescriptions: DICLOFENAC SODIUM 75 MG TBEC (DICLOFENAC SODIUM) one by mouth two times a day; take with food  #60 x 0   Entered and Authorized by:   Myrtie Soman  MD   Signed by:   Myrtie Soman  MD on 02/21/2010   Method used:   Electronically to        [facility] [phone]* (retail)       [address]       [place], [place]  [address]       Ph: [phone]       Fax: [phone]   RxID:   [id number]

## 2010-12-08 NOTE — Miscellaneous (Signed)
  Clinical Lists Changes  Medications: Added new medication of FLEXERIL 10 MG TABS (CYCLOBENZAPRINE HCL) one tab by mouth three times a day Added new medication of HYDROCHLOROTHIAZIDE 25 MG TABS (HYDROCHLOROTHIAZIDE) one tab by mouth daily for blood pressure Added new medication of VISTARIL 50 MG CAPS (HYDROXYZINE PAMOATE) one capsule every 6 hours Added new medication of TOPROL XL 25 MG XR24H-TAB (METOPROLOL SUCCINATE) one tab by mouth daily for blood pressure Added new medication of PRISTIQ 50 MG XR24H-TAB (DESVENLAFAXINE SUCCINATE) one tab by mouth daily for depression Added new medication of AMBIEN 10 MG TABS (ZOLPIDEM TARTRATE) one tab by mouth at bedtime Added new medication of LAMOTRIGINE 200 MG TABS (LAMOTRIGINE) one tab by mouth every morning Added new medication of REGLAN 10 MG TABS (METOCLOPRAMIDE HCL) one tab two times a day Added new medication of ZOFRAN 8 MG TABS (ONDANSETRON HCL) one tab by mouth two times a day

## 2010-12-08 NOTE — Assessment & Plan Note (Signed)
Summary: f/u/kh   Vital Signs:  Patient profile:   45 year old female Height:      65 inches Weight:      280 pounds BMI:     46.76 BSA:     2.28 Temp:     98.6 degrees F Pulse rate:   72 / minute BP sitting:   114 / 75  Vitals Entered By: Jone Baseman CMA (April 29, 2010 10:50 AM) CC: F/U shoulder Is Patient Diabetic? No Pain Assessment Patient in pain? yes     Location: right shoulder Intensity: 7   CC:  F/U shoulder.  History of Present Illness: 1. Back and shoulder pain Saw Dr. Jennette Kettle at [facility] early this month. Rec'd R shoulder injection. R arm feels better but now L arm more painful. Also with pain across the top of her back. Has taken robaxin and neurontin for several weeks now without any improvement.    2. weight Has gained 15 lbs since early May. Trying to get back to walking but is limited by pain in her knees and shoulders. Admits that she doesn't watch her diet very much at all.   .  Habits & Providers  Alcohol-Tobacco-Diet     Tobacco Status: never  Current Medications (verified): 1)  Hydrochlorothiazide 25 Mg Tabs (Hydrochlorothiazide) .... One Tab By Mouth Daily For Blood Pressure 2)  Vistaril 50 Mg Caps (Hydroxyzine Pamoate) .... One Capsule Every 6 Hours 3)  Toprol Xl 25 Mg Xr24h-Tab (Metoprolol Succinate) .... One Tab By Mouth Daily For Blood Pressure 4)  Pristiq 50 Mg Xr24h-Tab (Desvenlafaxine Succinate) .... One Tab By Mouth Daily For Depression 5)  Lamotrigine 200 Mg Tabs (Lamotrigine) .... One Tab By Mouth Every Morning 6)  Zofran 8 Mg Tabs (Ondansetron Hcl) .... One Tab By Mouth Two Times A Day Prn 7)  Prevacid 30 Mg Cpdr (Lansoprazole) .... One By Mouth Daily For Reflux 8)  Lamotrigine 25 Mg Tabs (Lamotrigine) .... Take 2 Tabs Daily For 2 Weeks; Then 4 Tabs Daily Signs 2 Weeks; Then Switch To 200 Mg Dose 9)  D3-50 50000 Unit Caps (Cholecalciferol) .... One By Mouth Once A Week For 8 Weeks 10)  Calcium Carbonate-Vitamin D 600-400 Mg-Unit Tabs  (Calcium Carbonate-Vitamin D) .... One By Mouth Two Times A Day With Meals 11)  Gabapentin 300 Mg Caps (Gabapentin) .... One By Mouth Three Times A Day For Neck/shoulder/arm Pain 12)  Robaxin 500 Mg Tabs (Methocarbamol) .... One By Mouth Up To 4 Times A Day  Allergies (verified): No Known Drug Allergies  Review of Systems       reports occasional SOB. Reports occasional knee pain.  Physical Exam  General:  obese. VS otherwise normal Msk:  Rotator cuff muscles have intact strength, pain with supraspinatus and IR testing. Also limited ER; more pain on L.   L shoulder normal to inspection and palpation. Additional Exam:  Patient given informed consent for injection. Appropriate verbal time out taken. Area cleaned and prepped in usual sterile fashion. -1 cc kennalog plus 4-cc 1% lidocaine without epinephrine was injected into the L shoulder. Patient tolerated procedure well without any apparent complications.    Impression & Recommendations:  Problem # 1:  SHOULDER PAIN, LEFT (ICD-719.41) Assessment New  neck films show mild deg disease of C3 and C4 in April. R shoulder improved but now L more bothersome. Possibly due to compensatory stress. Injection today. Symptomatic treatment with exercises, tramadol and flexeril.   The following medications were removed from the medication  list:    Robaxin 500 Mg Tabs (Methocarbamol) ..... One by mouth up to 4 times a day Her updated medication list for this problem includes:    Tramadol Hcl 50 Mg Tabs (Tramadol hcl) .Marland Kitchen... 1-2 tabs by mouth every 6 hours as needed    Flexeril 5 Mg Tabs (Cyclobenzaprine hcl) ..... One tab by mouth three times a day as  need for pain/spasm  Orders: [facility]- Est Level  3 ([address]109) Injection, large joint- [facility] (60454)  Complete Medication List: 1)  Hydrochlorothiazide 25 Mg Tabs (Hydrochlorothiazide) .... One tab by mouth daily for blood pressure 2)  Vistaril 50 Mg Caps (Hydroxyzine pamoate) .... One capsule every 6  hours 3)  Toprol Xl 25 Mg Xr24h-tab (Metoprolol succinate) .... One tab by mouth daily for blood pressure 4)  Pristiq 50 Mg Xr24h-tab (Desvenlafaxine succinate) .... One tab by mouth daily for depression 5)  Lamotrigine 200 Mg Tabs (Lamotrigine) .... One tab by mouth every morning 6)  Zofran 8 Mg Tabs (Ondansetron hcl) .... One tab by mouth two times a day prn 7)  Prevacid 30 Mg Cpdr (Lansoprazole) .... One by mouth daily for reflux 8)  Lamotrigine 25 Mg Tabs (Lamotrigine) .... Take 2 tabs daily for 2 weeks; then 4 tabs daily signs 2 weeks; then switch to 200 mg dose 9)  D3-50 50000 Unit Caps (Cholecalciferol) .... One by mouth once a week for 8 weeks 10)  Calcium Carbonate-vitamin D 600-400 Mg-unit Tabs (Calcium carbonate-vitamin d) .... One by mouth two times a day with meals 11)  Tramadol Hcl 50 Mg Tabs (Tramadol hcl) .Marland Kitchen.. 1-2 tabs by mouth every 6 hours as needed 12)  Flexeril 5 Mg Tabs (Cyclobenzaprine hcl) .... One tab by mouth three times a day as  need for pain/spasm  Patient Instructions: 1)  decrease your gabapentin to 2 pills a day for 3 days, 1 pills for 3 days, and then stop 2)  stop the robaxin since you're only taking once a day 3)  start the tramadol every 6 hours as needed 4)  start the flexeril every 8 hours as needed 5)  continue the rotator cuff exercises on both shoulders.  Prescriptions: FLEXERIL 5 MG TABS (CYCLOBENZAPRINE HCL) one tab by mouth three times a day as  need for pain/spasm  #60 x 0   Entered and Authorized by:   Myrtie Soman  MD   Signed by:   Myrtie Soman  MD on 04/29/2010   Method used:   Electronically to        [facility] [address] [phone]* (retail)       [address]       [place], [place]  [address]       Ph: [phone]       Fax: [phone]   RxID:   [id number] TRAMADOL HCL 50 MG TABS (TRAMADOL HCL) 1-2 tabs by mouth every 6 hours as needed  #90 x 0   Entered and Authorized by:   Myrtie Soman  MD   Signed by:   Myrtie Soman  MD on  04/29/2010   Method used:   Electronically to        [facility] [address] [phone]* (retail)       [address]       [place], [place]  [address]       Ph: [phone]       Fax: [phone]   RxID:   [id number]

## 2010-12-08 NOTE — Miscellaneous (Signed)
Summary: cp  Clinical Lists Changes CP started 5am. crushing & pressure. cannot take a deep breath. no smoking. difficulty talking. sent to ED via EMS. she agreed.[place] RN  Mar 25, 2010 10:58 AM

## 2010-12-08 NOTE — Assessment & Plan Note (Signed)
Summary: KNEE PAIN/KH   Vital Signs:  Patient profile:   45 year old female Height:      65 inches Weight:      295 pounds Pulse rate:   79 / minute BP sitting:   117 / 85  (right arm)  Vitals Entered By: Rochele Pages RN (August 08, 2010 2:03 PM) CC: bilat knee pain, L>R    Primary Care Provider:  Antoine Primas DO  CC:  bilat knee pain and L>R .  History of Present Illness: B knee pain, L > R. Had done well when she lost weight, then got pregnant and gained a lot of weight. Child is now 57 m old. She is trying to get back into activity again --wants to drop weight---B knee painis kkeeping her from doing much.  Pain is constant, worse with steps or walking. 5-8 /10/ No giving way. Has some clicking sensation at times. No redness or swelling.  Current Medications (verified): 1)  Hydrochlorothiazide 25 Mg Tabs (Hydrochlorothiazide) .... One Tab By Mouth Daily For Blood Pressure 2)  Vistaril 50 Mg Caps (Hydroxyzine Pamoate) .... One Capsule Every 6 Hours 3)  Toprol Xl 25 Mg Xr24h-Tab (Metoprolol Succinate) .... One Tab By Mouth Daily For Blood Pressure 4)  Pristiq 50 Mg Xr24h-Tab (Desvenlafaxine Succinate) .... One Tab By Mouth Daily For Depression 5)  Lamotrigine 200 Mg Tabs (Lamotrigine) .... One Tab By Mouth Every Morning 6)  Zofran 8 Mg Tabs (Ondansetron Hcl) .... One Tab By Mouth Two Times A Day Prn 7)  Prevacid 30 Mg Cpdr (Lansoprazole) .... One By Mouth Daily For Reflux 8)  D3-50 50000 Unit Caps (Cholecalciferol) .... One By Mouth Once A Week For 8 Weeks 9)  Calcium Carbonate-Vitamin D 600-400 Mg-Unit Tabs (Calcium Carbonate-Vitamin D) .... One By Mouth Two Times A Day With Meals 10)  Nortriptyline Hcl 25 Mg Caps (Nortriptyline Hcl) .... Take 1 Pill At Night 11)  Vicodin 5-500 Mg Tabs (Hydrocodone-Acetaminophen) .... Take 1-2 Every 6 Hours For Pain 12)  Amoxicillin 500 Mg Tabs (Amoxicillin) .Marland Kitchen.. 1 Tab By Mouth Two Times A Day For The Next 10 Days  Allergies: No Known Drug  Allergies  Review of Systems       The patient complains of weight gain.  The patient denies fever.         Please see HPI for additional ROS.   Physical Exam  General:  alert, well-developed, well-hydrated, and overweight-appearing.   Msk:  B knees are TTP medail joint lines. Crepitus noted right knee. Ligamentously intact B. Negative mcMurray B.  Distally neurovascualrly intact with soft calves, normal popliteal space (benign). No skin changes.  Gait is antalgic and a little wide based. Additional Exam:  reviewed her standing B knee fiolms via E chart which show loss of joint space on both knees, not quite bone on bone. She also has ranslation of the tibia relative to the femur (advanced OA change) on both sides as well as some osteophytes.  Patient given informed consent for injection. Discussed possible complications of infection, bleeding or skin atrophy at site of injection. Possible side effect of avascular necrosis (focal area of bone death) due to steroid use.Appropriate verbal time out taken Are cleaned and prepped in usual sterile fashion. A ---1- cc kennalog plus -4---cc 1% lidocaine without epinephrine was injected into the-left knee using anterior approach--. Patient tolerated procedure well with no complications.    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 Recommend water aerobics and she had already planned to investigate classes at [facility] stationary bike, low tension. We opted for steroid injection in her left knee today. She is not sure that shots have helped in past. If this helps significany, will rtc for right knee injection. If shopt does not help, will not retry future injections.  Orders: Joint Aspirate / Injection, Large (20610) Kenalog 10 mg inj (J3301)  Complete Medication List: 1)  Hydrochlorothiazide 25 Mg Tabs (Hydrochlorothiazide) .... One tab by mouth daily for blood  pressure 2)  Vistaril 50 Mg Caps (Hydroxyzine pamoate) .... One capsule every 6 hours 3)  Toprol Xl 25 Mg Xr24h-tab (Metoprolol succinate) .... One tab by mouth daily for blood pressure 4)  Pristiq 50 Mg Xr24h-tab (Desvenlafaxine succinate) .... One tab by mouth daily for depression 5)  Lamotrigine 200 Mg Tabs (Lamotrigine) .... One tab by mouth every morning 6)  Zofran 8 Mg Tabs (Ondansetron hcl) .... One tab by mouth two times a day prn 7)  Prevacid 30 Mg Cpdr (Lansoprazole) .... One by mouth daily for reflux 8)  D3-50 50000 Unit Caps (Cholecalciferol) .... One by mouth once a week for 8 weeks 9)  Calcium Carbonate-vitamin D 600-400 Mg-unit Tabs (Calcium carbonate-vitamin d) .... One by mouth two times a day with meals 10)  Nortriptyline Hcl 25 Mg Caps (Nortriptyline hcl) .... Take 1 pill at night 11)  Vicodin 5-500 Mg Tabs (Hydrocodone-acetaminophen) .... Take 1-2 every 6 hours for pain 12)  Amoxicillin 500 Mg Tabs (Amoxicillin) .Marland Kitchen.. 1 tab by mouth two times a day for the next 10 days

## 2010-12-08 NOTE — Assessment & Plan Note (Signed)
Summary: F/U LABS/[facility]   Vital Signs:  Patient profile:   45 year old female Height:      65 inches Weight:      262.1 pounds BMI:     43.77 Temp:     98.0 degrees F oral Pulse rate:   62 / minute BP sitting:   126 / 85  (left arm) Cuff size:   large  Vitals Entered By: Garen Grams LPN (February 18, 2010 10:13 AM) CC: f/u pain in rt arm Is Patient Diabetic? No Pain Assessment Patient in pain? yes     Location: rt arm   CC:  f/u pain in rt arm.  History of Present Illness: 1. R arm pain/Neck pain States that this is worse. Has started using a TENS unit that she had from [facility] and has been using a friends "pain patch" which she recognizes as lidocaine by name. States that the pain is sharp and worse with fine motor skills. Has the sensation of her fingers locking and occasionally can't abduct the arm very much. Also notes pain shooting up from her fingers to her shoulder with a warm feeling in her forearm. Has never had a specific diagnosis.   2. vit D Vit D low at 11. pt reports that she feels fatigued frequently and wonders if this might be related to her low Vitamin D  3. obesity Has gained about 50-60 lbs in the last 1-2 years. Does not watch her diet closely. Thinks she had  lipid panel done in 2007 that was good. Lipid panel done here in March was quite good. Open to meeting with a nutritionist.  4. depression/bipolar States that this was not doing well off her medication. Has restarted her lamictal and at 200 mg daily. States that she hasn't noticed much of an improvement but her family has. States that her mood is more stable/even. Denies depressed mood which is a change from last visit.   Reports an overdose of sleeping pills at age 21 and at age in mid-20s. No hospitalizations for pysch-related issues Reports last SI in  2007.  5. Hypertension adherent to medications: yes side effects from medications:no subjective: no problems with medication  ROS: chest pain: no       SOB: no    swelling: no  reports occasional nausea; no vomiting; has loose stools since her GB was taken out.     Habits & Providers  Alcohol-Tobacco-Diet     Tobacco Status: never  Allergies: No Known Drug Allergies  Social History: Smoking Status:  never  Physical Exam  General:  Vital signs reviewed -- obese but otherwise normal Alert, appropriate; well-dressed and well-nourished  Lungs:  work of breathing unlabored, clear to auscultation bilaterally; no wheezes, rales, or ronchi; good air movement throughout  Heart:  regular rate and rhythm, no murmurs; normal s1/s2  Msk:  Neck: no c-spine tenderness with palpation, some paracervical tenderness but not spasm. Has limited ROM in all dimensions with pain at extent of motion. Spurling's produces pain bilaterally in the neck but no radicular symptoms.  R shoulder: painful abduction at about 90 degress both forward and lateral. Limted abduction to 120 degrees. Painful neer/empty can/Hawkins. Further exam deferred due to pt discomfort.   4/5 R grip strength, biceps, triceps; 5/5 on left.  Psych:  alert and oriented. full affect, normally interactive. Good eye contact. Speech normal rate. Not grandiose. No evidence of AVH. Denies SI.    Impression & Recommendations:  Problem # 1:  NECK PAIN (ICD-723.1) Assessment Deteriorated  Will obtain neck and shoulder films to eval for anatomical pathology. Start celebrex two times a day and pt to call if no improvement. Okay to continue TENS but d/c lidocaine patch. Will follow-up on films. Consider injection or [facility] referral. Not sure if this is muscular, nerve-relatedl or mixed issue.   The following medications were removed from the medication list:    Flexeril 10 Mg Tabs (Cyclobenzaprine hcl) ..... One tab by mouth three times a day Her updated medication list for this problem includes:    Celebrex 200 Mg Caps (Celecoxib) ..... One by mouth two times a day -- take with  food    The following medications were removed from the medication list:    Flexeril 10 Mg Tabs (Cyclobenzaprine hcl) ..... One tab by mouth three times a day Her updated medication list for this problem includes:    Celebrex 200 Mg Caps (Celecoxib) ..... One by mouth two times a day -- take with food  Orders: Diagnostic X-Ray/Fluoroscopy (Diagnostic X-Ray/Flu) [facility]- Est  Level 4 ([address])  Problem # 2:  VITAMIN D DEFICIENCY (ICD-268.9) Assessment: New  replete  Orders: [facility]- Est  Level 4 ([address])  Problem # 3:  OBESITY, NOS (ICD-278.00) Assessment: Unchanged  pt agreeable to nutrition appointment. Discussed the risk of persistent obesity. Lipid panel very good.   Ht: 65 (02/18/2010)   Wt: 262.1 (02/18/2010)   BMI: 43.77 (02/18/2010)  Orders: [facility]- Est  Level 4 (09811)  Problem # 4:  BIPOLAR AFFECTIVE DISORDER, DEPRESSED (ICD-296.50) Assessment: Improved  better with lamictal. continue to follow. pt does not endorse depression.  Orders: [facility]- Est  Level 4 (99214)  Problem # 5:  ESSENTIAL HYPERTENSION, BENIGN (ICD-401.1) Assessment: Unchanged  bp well-controlled today. K low at 3.2. Plan to recheck potassium at next visit.  Her updated medication list for this problem includes:    Hydrochlorothiazide 25 Mg Tabs (Hydrochlorothiazide) ..... One tab by mouth daily for blood pressure    Toprol Xl 25 Mg Xr24h-tab (Metoprolol succinate) ..... One tab by mouth daily for blood pressure    Her updated medication list for this problem includes:    Hydrochlorothiazide 25 Mg Tabs (Hydrochlorothiazide) ..... One tab by mouth daily for blood pressure    Toprol Xl 25 Mg Xr24h-tab (Metoprolol succinate) ..... One tab by mouth daily for blood pressure  Orders: [facility]- Est  Level 4 (99214)  Complete Medication List: 1)  Hydrochlorothiazide 25 Mg Tabs (Hydrochlorothiazide) .... One tab by mouth daily for blood pressure 2)  Vistaril 50 Mg Caps (Hydroxyzine pamoate) .... One capsule every  6 hours 3)  Toprol Xl 25 Mg Xr24h-tab (Metoprolol succinate) .... One tab by mouth daily for blood pressure 4)  Pristiq 50 Mg Xr24h-tab (Desvenlafaxine succinate) .... One tab by mouth daily for depression 5)  Lamotrigine 200 Mg Tabs (Lamotrigine) .... One tab by mouth every morning 6)  Zofran 8 Mg Tabs (Ondansetron hcl) .... One tab by mouth two times a day prn 7)  Prevacid 30 Mg Cpdr (Lansoprazole) .... One by mouth daily for reflux 8)  Lamotrigine 25 Mg Tabs (Lamotrigine) .... Take 2 tabs daily for 2 weeks; then 4 tabs daily signs 2 weeks; then switch to 200 mg dose 9)  Celebrex 200 Mg Caps (Celecoxib) .... One by mouth two times a day -- take with food 10)  D3-50 50000 Unit Caps (Cholecalciferol) .... One by mouth once a week for 8 weeks 11)  Calcium Carbonate-vitamin D 600-400 Mg-unit Tabs (  Calcium carbonate-vitamin d) .... One by mouth two times a day with meals  Patient Instructions: 1)  stop the ibuprofen and lidocaine patch. 2)  start the celebrex two times a day; take it even if you're not hurting 3)  if you're note impoving over the next week, call and we'll see about adding the tramadol. 4)  start take the vitamin D to get your level back to normal. 5)  eat potassium-rich foods like bananas, orange juice and green, leafy vegetables. 6)  schedule a nutrtion appointment with Dr. Gerilyn Pilgrim.  7)  follow-up with me in 3-4 weeks.  Prescriptions: ZOFRAN 8 MG TABS (ONDANSETRON HCL) one tab by mouth two times a day PRN  #10 x 0   Entered and Authorized by:   Myrtie Soman  MD   Signed by:   Myrtie Soman  MD on 02/18/2010   Method used:   Electronically to        [facility] [address] [phone]* (retail)       [address]       [place], [place]  [address]       Ph: [phone]       Fax: [phone]   RxID:   [id number] CALCIUM CARBONATE-VITAMIN D 600-400 MG-UNIT TABS (CALCIUM CARBONATE-VITAMIN D) one by mouth two times a day with meals  #60 x 3   Entered and Authorized by:   Myrtie Soman  MD   Signed by:   Myrtie Soman  MD on 02/18/2010   Method used:   Electronically to        [facility] [address] [phone]* (retail)       [address]       [place], [place]  [address]       Ph: [phone]       Fax: [phone]   RxID:   [id number] D3-50 50000 UNIT CAPS (CHOLECALCIFEROL) one by mouth once a week for 8 weeks  #8 x 0   Entered and Authorized by:   Myrtie Soman  MD   Signed by:   Myrtie Soman  MD on 02/18/2010   Method used:   Electronically to        [facility] [address] [phone]* (retail)       [address]       [place], [place]  [address]       Ph: [phone]       Fax: [phone]   RxID:   [id number] CELEBREX 200 MG CAPS (CELECOXIB) one by mouth two times a day -- take with food  #60 x 0   Entered and Authorized by:   Myrtie Soman  MD   Signed by:   Myrtie Soman  MD on 02/18/2010   Method used:   Electronically to        [facility] [address] [phone]* (retail)       [address]       [place], [place]  [address]       Ph: [phone]       Fax: [phone]   RxID:   [id number]    Prevention & Chronic Care Immunizations   Influenza vaccine: Not documented    Tetanus booster: 12/07/2000: Done.    Pneumococcal vaccine: Not documented  Other Screening   Pap smear: normal  (11/06/2009)    Mammogram: normal  (12/09/2007)   Mammogram due: 12/08/2009   Smoking status: never  (02/18/2010)  Lipids   Total Cholesterol: 157  (01/27/2010)  LDL: 96  (01/27/2010)   LDL Direct: Not documented   HDL: 47  (01/27/2010)   Triglycerides: 69  (01/27/2010)  Hypertension   Last Blood Pressure: 126 / 85  (02/18/2010)   Serum creatinine: 0.71  (01/27/2010)   Serum potassium 3.2  (01/27/2010)    Hypertension flowsheet reviewed?: Yes   Progress toward BP goal: At goal  Self-Management Support :   Personal Goals (by the next clinic visit) :      Personal blood pressure goal: 140/90  (02/18/2010)   Hypertension self-management  support: Referred for medical nutrition therapy  (02/18/2010)    Hypertension self-management support not done because: Good outcomes  (02/18/2010)   Nursing Instructions: Refer for medical nutrition therapy (see order)    Diabetes Self Management Training Referral Patient Name: Katie [name] Date Of Birth: 1966-02-11 MRN: [id number] Current Diagnosis:  VITAMIN D DEFICIENCY (ICD-268.9) NECK PAIN (ICD-723.1) ARM PAIN, RIGHT (ICD-729.5) INSOMNIA (ICD-780.52) ESSENTIAL HYPERTENSION, BENIGN (ICD-401.1) BIPOLAR AFFECTIVE DISORDER, DEPRESSED (ICD-296.50) OBESITY, NOS (ICD-278.00) IRRITABLE BOWEL SYNDROME (ICD-564.1) HEARING LOSS NOS OR DEAFNESS (ICD-389.9) EDEMA-LEGS,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

## 2011-01-16 LAB — DIFFERENTIAL
Eosinophils Relative: 1 % (ref 0–5)
Lymphocytes Relative: 36 % (ref 12–46)
Lymphs Abs: 4.9 10*3/uL — ABNORMAL HIGH (ref 0.7–4.0)
Monocytes Absolute: 0.8 10*3/uL (ref 0.1–1.0)

## 2011-01-16 LAB — CBC
MCH: 31 pg (ref 26.0–34.0)
Platelets: 361 10*3/uL (ref 150–400)
RBC: 4.13 MIL/uL (ref 3.87–5.11)

## 2011-01-16 LAB — COMPREHENSIVE METABOLIC PANEL
AST: 14 U/L (ref 0–37)
Albumin: 3.1 g/dL — ABNORMAL LOW (ref 3.5–5.2)
Calcium: 8.8 mg/dL (ref 8.4–10.5)
Chloride: 101 mEq/L (ref 96–112)
Creatinine, Ser: 0.66 mg/dL (ref 0.4–1.2)
GFR calc Af Amer: >60 mL/min (ref >60)
Total Bilirubin: 0.4 mg/dL (ref 0.3–1.2)

## 2011-01-16 LAB — POCT PREGNANCY, URINE: Preg Test, Ur: NEGATIVE

## 2011-01-16 LAB — POCT CARDIAC MARKERS
CKMB, poc: <1 ng/mL — ABNORMAL LOW (ref 1.0–8.0)
Troponin i, poc: <0.05 ng/mL (ref 0.00–0.09)

## 2011-01-19 LAB — URINALYSIS, ROUTINE W REFLEX MICROSCOPIC
Hgb urine dipstick: NEGATIVE
Nitrite: NEGATIVE
Protein, ur: NEGATIVE mg/dL
Urobilinogen, UA: 0.2 mg/dL (ref 0.0–1.0)

## 2011-01-19 LAB — POCT I-STAT, CHEM 8
HCT: 42 % (ref 36.0–46.0)
Hemoglobin: 14.3 g/dL (ref 12.0–15.0)
Potassium: 3.6 mEq/L (ref 3.5–5.1)
Sodium: 137 mEq/L (ref 135–145)

## 2011-01-19 LAB — CBC
MCHC: 33.7 g/dL (ref 30.0–36.0)
Platelets: 337 10*3/uL (ref 150–400)
RDW: 13.4 % (ref 11.5–15.5)
WBC: 9.1 10*3/uL (ref 4.0–10.5)

## 2011-01-19 LAB — POCT PREGNANCY, URINE: Preg Test, Ur: NEGATIVE

## 2011-01-22 LAB — URINALYSIS, ROUTINE W REFLEX MICROSCOPIC
Glucose, UA: NEGATIVE mg/dL
Protein, ur: NEGATIVE mg/dL

## 2011-01-22 LAB — POCT PREGNANCY, URINE: Preg Test, Ur: NEGATIVE

## 2011-01-23 LAB — DIFFERENTIAL
Eosinophils Absolute: 0.1 10*3/uL (ref 0.0–0.7)
Eosinophils Relative: 1 % (ref 0–5)
Lymphs Abs: 3 10*3/uL (ref 0.7–4.0)
Monocytes Relative: 8 % (ref 3–12)

## 2011-01-23 LAB — BASIC METABOLIC PANEL
BUN: 5 mg/dL — ABNORMAL LOW (ref 6–23)
Chloride: 100 mEq/L (ref 96–112)
Potassium: 3.5 mEq/L (ref 3.5–5.1)

## 2011-01-23 LAB — CK TOTAL AND CKMB (NOT AT ARMC): CK, MB: 0.4 ng/mL (ref 0.3–4.0)

## 2011-01-23 LAB — CBC
HCT: 36.1 % (ref 36.0–46.0)
MCV: 94.5 fL (ref 78.0–100.0)
Platelets: 330 10*3/uL (ref 150–400)
RBC: 3.82 MIL/uL — ABNORMAL LOW (ref 3.87–5.11)
WBC: 10.2 10*3/uL (ref 4.0–10.5)

## 2011-01-23 LAB — HEPATIC FUNCTION PANEL
Alkaline Phosphatase: 73 U/L (ref 39–117)
Indirect Bilirubin: 0.8 mg/dL (ref 0.3–0.9)
Total Bilirubin: 0.9 mg/dL (ref 0.3–1.2)

## 2011-01-23 LAB — TROPONIN I: Troponin I: 0.01 ng/mL (ref 0.00–0.06)

## 2011-01-23 LAB — LIPASE, BLOOD: Lipase: 22 U/L (ref 11–59)

## 2011-02-10 LAB — CBC
HCT: 36.8 % (ref 36.0–46.0)
Hemoglobin: 12.4 g/dL (ref 12.0–15.0)
Platelets: 296 10*3/uL (ref 150–400)
WBC: 12.1 10*3/uL — ABNORMAL HIGH (ref 4.0–10.5)

## 2011-02-10 LAB — POCT CARDIAC MARKERS
CKMB, poc: <1 ng/mL — ABNORMAL LOW (ref 1.0–8.0)
CKMB, poc: <1 ng/mL — ABNORMAL LOW (ref 1.0–8.0)
Troponin i, poc: <0.05 ng/mL (ref 0.00–0.09)

## 2011-02-10 LAB — DIFFERENTIAL
Eosinophils Relative: 1 % (ref 0–5)
Lymphocytes Relative: 39 % (ref 12–46)
Lymphs Abs: 4.7 10*3/uL — ABNORMAL HIGH (ref 0.7–4.0)
Neutro Abs: 6.6 10*3/uL (ref 1.7–7.7)

## 2011-02-10 LAB — D-DIMER, QUANTITATIVE: D-Dimer, Quant: 0.52 ug/mL-FEU — ABNORMAL HIGH (ref 0.00–0.48)

## 2011-02-10 LAB — BASIC METABOLIC PANEL
BUN: 6 mg/dL (ref 6–23)
GFR calc non Af Amer: >60 mL/min (ref >60)
Potassium: 4 mEq/L (ref 3.5–5.1)
Sodium: 136 mEq/L (ref 135–145)

## 2011-02-14 LAB — URINALYSIS, ROUTINE W REFLEX MICROSCOPIC
Bilirubin Urine: NEGATIVE
Hgb urine dipstick: NEGATIVE
Ketones, ur: NEGATIVE mg/dL
Nitrite: NEGATIVE
Urobilinogen, UA: 0.2 mg/dL (ref 0.0–1.0)

## 2011-02-14 LAB — DIFFERENTIAL
Basophils Absolute: 0 10*3/uL (ref 0.0–0.1)
Basophils Relative: 0 % (ref 0–1)
Eosinophils Absolute: 0 10*3/uL (ref 0.0–0.7)
Eosinophils Relative: 0 % (ref 0–5)
Monocytes Absolute: 1 10*3/uL (ref 0.1–1.0)
Monocytes Relative: 7 % (ref 3–12)
Neutro Abs: 8.1 10*3/uL — ABNORMAL HIGH (ref 1.7–7.7)

## 2011-02-14 LAB — CBC
HCT: 40.3 % (ref 36.0–46.0)
Hemoglobin: 13.8 g/dL (ref 12.0–15.0)
MCHC: 34.3 g/dL (ref 30.0–36.0)
MCV: 93.7 fL (ref 78.0–100.0)
RDW: 13.6 % (ref 11.5–15.5)

## 2011-02-14 LAB — POCT CARDIAC MARKERS
CKMB, poc: <1 ng/mL — ABNORMAL LOW (ref 1.0–8.0)
CKMB, poc: <1 ng/mL — ABNORMAL LOW (ref 1.0–8.0)
Myoglobin, poc: 34.7 ng/mL (ref 12–200)

## 2011-02-14 LAB — POCT PREGNANCY, URINE: Preg Test, Ur: NEGATIVE

## 2011-02-14 LAB — ETHANOL: Alcohol, Ethyl (B): <5 mg/dL (ref 0–10)

## 2011-02-14 LAB — POCT I-STAT, CHEM 8
BUN: <3 mg/dL — ABNORMAL LOW (ref 6–23)
Calcium, Ion: 1.05 mmol/L — ABNORMAL LOW (ref 1.12–1.32)
TCO2: 26 mmol/L (ref 0–100)

## 2011-02-14 LAB — RAPID URINE DRUG SCREEN, HOSP PERFORMED
Amphetamines: NOT DETECTED
Benzodiazepines: NOT DETECTED
Cocaine: NOT DETECTED
Tetrahydrocannabinol: POSITIVE — AB

## 2011-02-14 LAB — TRICYCLICS SCREEN, URINE: TCA Scrn: NOT DETECTED

## 2011-03-07 ENCOUNTER — Encounter: Payer: Self-pay | Admitting: Family Medicine

## 2011-03-08 ENCOUNTER — Encounter: Payer: Self-pay | Admitting: Family Medicine

## 2011-03-08 ENCOUNTER — Ambulatory Visit (INDEPENDENT_AMBULATORY_CARE_PROVIDER_SITE_OTHER): Payer: Medicaid Other | Admitting: Family Medicine

## 2011-03-08 DIAGNOSIS — K219 Gastro-esophageal reflux disease without esophagitis: Secondary | ICD-10-CM

## 2011-03-08 DIAGNOSIS — E669 Obesity, unspecified: Secondary | ICD-10-CM

## 2011-03-08 DIAGNOSIS — I10 Essential (primary) hypertension: Secondary | ICD-10-CM

## 2011-03-08 MED ORDER — LANSOPRAZOLE 30 MG PO CPDR: 30.0000 mg | DELAYED_RELEASE_CAPSULE | Freq: Every day | ORAL | Status: DC

## 2011-03-08 NOTE — Assessment & Plan Note (Signed)
Started previcid again, will see if symptoms improved gave pt red flags to look out for.

## 2011-03-08 NOTE — Progress Notes (Signed)
  Subjective:    Patient ID: Katie [name], female    DOB: 1966-03-19, 45 y.o.   MRN: [id number]  HPI GERD-  Pt states she is having more pain with eating,, right in the middle has not been taking previcid because she has not had it refilled.   Weight-  Pt has been struggling with losing weight, has gained 1 # since last visit.  Pt states she has been going to the pool at the [facility]. Pt states she is usually there 2-3 times a week for an hour or two at  A time. Pt states she used to be on phentramine and would like to try it again. Pt has met with Dr. Gerilyn Pilgrim on two occasions and were helpful but do not feel she could change much more.   1. Hypertension Blood pressure at home:been normal per pt Blood pressure today: 117/81 Taking Meds:yes Side effects:no ROS: Denies headache visual changes nausea, vomiting, chest pain or abdominal pain or shortness of breath.     Review of Systems See above.   Past medical, surgical and family hx were reviewed with no changes.    Objective:   Physical Exam Gen: NAd obese CV: RRR distant no murmurs Pul: CTAB Abd: obese, NT Ext: trace to +1 edema to knees.        Assessment & Plan:

## 2011-03-08 NOTE — Patient Instructions (Signed)
Good to see you Katie [name] looks great I want you to keep trucking a long with your weight.  I know you can do it.  Your blood pressure is great Start your previcid again I think that will help your stomach pain I want to see you again in 2-3 months.

## 2011-03-08 NOTE — Assessment & Plan Note (Signed)
Pt has gained weight again, pt declined wanting to see nutrition, did not feel comfortable to do weight loss medication in this pt.  Pt told she can find anotherr provider to help her with that, told her I would support her from a exercise and diet standpoint.

## 2011-03-08 NOTE — Assessment & Plan Note (Signed)
At goal continue current regimen. 

## 2011-03-24 NOTE — Op Note (Signed)
[facility]  Patient:    Katie [name], Katie [name]                         MRN: [id number] Adm. Date:  [id number] Attending:  Tammi Sou Dictator:   Jamey Reas, M.D.                           Operative Report  DATE OF BIRTH:                May 01, 1966  PREOPERATIVE DIAGNOSES:       1. Repeat low transverse cesarean section.                               2. Arrest of dilatation.                               3. Fetal tachycardia.                               4. Desire for permanent sterilization.  POSTOPERATIVE DIAGNOSES:      1. Repeat low transverse cesarean section.                               2. Arrest of dilatation.                               3. Fetal tachycardia.                               4. Desire for permanent sterilization.:  PROCEDURE:                    Repeat low transverse cesarean section via                               Pfannenstiel and bilateral tubal ligation.  SURGEON:                      Roseanna Rainbow, M.D.  ASSISTANT:                    Jamey Reas, M.D.  ANESTHESIA:                   Epidural.  FINDINGS:                     Viable female at 30 in cephalic presentation. Apgars 1 at one minute, 9 at five minutes. Normal uterus, tubes, and ovaries.  INTRAVENOUS FLUID:            1800 cc lactated Ringers.  ESTIMATED BLOOD LOSS:         800 cc.  URINE OUTPUT:                 400 cc, clear but concentrated at the end of  the procedure.  SPECIMENS:                    Bilateral portion of tubes.  DESCRIPTION OF PROCEDURE:     The patient was taken to the operating room where epidural anesthesia was found to be adequate. She was then prepped and draped in normal sterile fashion in dorsal supine position with a leftward tilt. A Pfannenstiel skin incision was then made with the scalpel and carried through to the underlying layer of fascia with a knife. The fascia  was incised in the midline and the incision extended laterally with the Mayo scissors. The superior aspect of the fascial incision was then grasped with Kocher clamps, elevated, and the underlying rectus muscles dissected off bluntly.  Attention was then turned to the inferior aspect of this incision, which in a similar fashion, was grasped, tented up with Kocher clamps, and rectus muscles dissected off bluntly. The rectus muscles were then separated in the midline and the peritoneum identified, tented up, and entered sharply with Metzenbaum scissors. The peritoneal incision was then extended superiorly and inferiorly with good visualization of the bladder. The bladder blade was then inserted and the vesicouterine peritoneum identified, grasped with pickups, and entered sharply with Metzenbaum scissors. This incision was then extended laterally and the bladder flap was created using dissection with the Metzenbaum scissors. The bladder blade was then reinserted and the lower uterine segment incised in a transverse fashion with the scalpel. The uterine incision was then extended laterally with bandage scissors. The bladder blade was removed and the infants head delivered atraumatically. The nose and mouth were suctioned with bulb suction and the cord clamped and cut. The infant was handed off to the awaiting pediatricians. Cord blood was sent. The placenta was then removed manually. The uterus was exteriorized and cleared of all clots and debris. The uterine incision was repaired with 0 Monocryl in a running locked fashion. The uterus was checked then for good hemostasis. It was irrigated thoroughly, all clots and debris were removed from the gutters. The right tube was grasped with Babcock clamps, elevated, two ties of plain gut were put around this. The ligated portion of the tube was cut with Metzenbaum scissors. The left tube was elevated with a Babcock clamps. It was difficult to get  below this secondary to venous engorgement in the broad ligament. Modified Parkland method was used to ligate the tube on both ends. The tube was then interrupted with Metzenbaum scissors. Both sutures were noted to be intact upon the uterus return to the abdomen. The gutters again were irrigated and cleared of all clots and debris. The fascia was reapproximated with 0 PDS in a running fashion. The skin was closed with staples. The patient tolerated the procedure well. Sponge, lap, and needle counts were correct x 2. Three grams of Unasyn was given prior to C-section. The patient was taken to the recovery room in stable condition. DD:  12/06/00 TD:  12/06/00 Job: [id number]

## 2011-03-24 NOTE — Discharge Summary (Signed)
[facility]  Patient:    Katie [name], Katie [name]                         MRN: [id number] Adm. Date:  [id number] Disc. Date: 12/09/00 Attending:  Tammi Sou Dictator:   Zella Ball, M.D.                           Discharge Summary  DATE OF BIRTH:                1966/08/07  DISCHARGE DIAGNOSES:          1. Intrauterine pregnancy at term--delivered.                               2. Status post low transverse cesarean section.                               3. Status post bilateral tubal ligation.  PROCEDURES WHILE IN HOSPITAL:                  1. Low transverse cesarean section.                               2. Bilateral tubal ligation.  PRESENTING HISTORY:           This is a 45 year old, G3, P1-0-1-1, who presented to [facility] at 39 weeks 1 day gestation dated by an 11-week ultrasound, who presented with contractions and concern that she was in labor. The patient at that time denied vaginal bleeding, denied loss of fluid, and affirmed that she did feel fetal movement. The patients prior pregnancy was a low transverse cesarean section secondary to failure to progress. The patient had been followed with her prenatal care at [facility] with onset of care at [redacted] weeks gestation. Complicating her prenatal course, apparently, was placenta previa, diagnosed earlier in pregnancy, which apparently had resolved. The patient was not taking any medications other than Tylenol and had no allergies.  OBSTETRICAL HISTORY:          In 1988, the patient had an SAB; in 1997, a low transverse cesarean section secondary to failure to progress.  SOCIAL HISTORY:               The patient denied tobacco, alcohol, and drug use.  ADMITTING LABORATORIES:       The patients blood type is B positive with a negative antibody screen. Hematocrit 38.7, platelets of 153,000. The patient was rubella immune. Hepatitis B surface antigen negative. Syphilis  negative. HIV negative. GC and Chlamydia negative. GBS negative in November of 2001.  VITAL SIGNS ON ADMISSION:     The patient was afebrile with stable vital signs.  HOSPITAL COURSE:              Initial exam showed the patient with 50% effacement and 2 cm dilation. Fetal heart rate had a normal baseline at 120 to 130, had moderate variability, and good accelerations. The patient was contracting roughly every three minutes. Assessment at that time was a 45 year old woman at term who desired to try vaginal delivery after C-section and was admitted initially for therapeutic rest and then trial of labor. The patient attempted to labor; however, the  patient had developed a low-grade fever during the course of her labor, and was begun on Unasyn and then was judged to have failure to dilate after reaching 8 cm and 90% effacement. The patient spent several hours at that dilation and, therefore, on January 31 at 8 p.m., it was decided to take the patient back for a cesarean section where she was delivered of a viable female, Apgars 1 at one minute and 9 at five minutes. The baby received blow-by oxygen and bag/masking for one minute. The infant weighed 7 pounds 6 ounces. For full discussion of that surgery, please see dictation of January 31. At that time, the patient also had a bilateral tubal ligation.  The patient had an uncomplicated postoperative course. She was breast-feeding without issue and was discharged to home on February 3.  DISCHARGE MEDICATIONS:        1. Ibuprofen 600 mg q.6h. for pain.                               2. Percocet 2.5/325 one to two q.4h. p.r.n                                  pain.                               3. Prenatal vitamins for the course of                                  breast-feeding.  DISCHARGE LABORATORIES:       The patient had a hemoglobin on discharge of 10.9.  FOLLOWUP:                     The patient is to follow up in six weeks  at [facility]. DD:  12/09/00 TD:  12/09/00 Job: [id number]

## 2011-05-09 ENCOUNTER — Other Ambulatory Visit: Payer: Self-pay | Admitting: Family Medicine

## 2011-05-09 NOTE — Telephone Encounter (Signed)
Refill request

## 2011-05-30 ENCOUNTER — Telehealth: Payer: Self-pay | Admitting: Family Medicine

## 2011-05-30 NOTE — Telephone Encounter (Signed)
TO MD for Referral if he agrees. Fleeger, Maryjo Rochester

## 2011-05-30 NOTE — Telephone Encounter (Signed)
LMOVM informing pt of MD info Nakota Ackert, Maryjo Rochester

## 2011-05-30 NOTE — Telephone Encounter (Signed)
Would like a referral to a weight loss clinic [facility] [phone].

## 2011-05-30 NOTE — Telephone Encounter (Signed)
I think this is a great idea called health clinic and they do not need a referral.  Please call pt and tell her.

## 2011-06-16 ENCOUNTER — Inpatient Hospital Stay (INDEPENDENT_AMBULATORY_CARE_PROVIDER_SITE_OTHER)
Admission: RE | Admit: 2011-06-16 | Discharge: 2011-06-16 | Disposition: A | Discharge status: Home / Self Care | Payer: Medicaid Other | Source: Ambulatory Visit | Attending: Family Medicine | Admitting: Family Medicine

## 2011-06-16 ENCOUNTER — Ambulatory Visit (INDEPENDENT_AMBULATORY_CARE_PROVIDER_SITE_OTHER): Payer: Medicaid Other

## 2011-06-16 DIAGNOSIS — L03019 Cellulitis of unspecified finger: Secondary | ICD-10-CM

## 2011-06-26 NOTE — Consult Note (Signed)
NAMENEOMI, LAIDLER NO.:  [id number]  MEDICAL RECORD NO.:  [id number]  LOCATION:  URG                          FACILITY:  [facility]  PHYSICIAN:  Dionne Ano. Even Budlong, M.D.DATE OF BIRTH:  07-15-1966  DATE OF CONSULTATION:  06/16/2011 DATE OF DISCHARGE:  06/16/2011                                CONSULTATION   REFERRING PHYSICIAN:  Antoine Primas, DO  I had the pleasure to see Katie [name] today in the [facility].  Clementina is a 45 year old female who complains of left small finger pain.  I was asked to see her by Dr. Valente David working in the ER tonight.  The patient notes no locking, popping, catching, but states she was working yesterday and subsequent to this began having swelling in the volar aspect of her middle phalanx.  She notes no trauma that she is acutely aware of or obvious inciting event, such as a puncture wound, etc.  She questioned whether she could have been bitten, but does not recall a specific bite nor is there an obvious portal of injury.  The patient is alert and oriented.  She complains of moderate pain, constant in nature.  PAST MEDICAL HISTORY:  Depression, hypertension.  MEDICATIONS:  Pristiq, nortriptyline, hydrochlorothiazide.  She does not smoke.  She occasionally enjoys an alcoholic beverage.  She works as a Futures trader.  She has history of tonsillectomy.  PE tube placement, c-section, cholecystectomy, and a cardiac catheterization.  ALLERGIES:  No known drug allergies.  FAMILY HISTORY:  Noncontributory.  REVIEW OF SYSTEMS:  Negative for fever, chills, nausea, vomiting.  PHYSICAL EXAMINATION:  GENERAL:  She is alert and oriented, in no acute distress. VITAL SIGNS:  Stable.  She is afebrile. HEENT:  Normal examination. CHEST:  Clear.  There is no evidence of dystrophic reaction. EXTREMITIES:  No evidence of lower extremity trauma, DVT or other abnormalities.  The patient has notable findings of pain in her small finger on  the left hand.  There is no Kanavel signs, but she does obviously have some swelling and cellulitis volarly and dorsally over the middle phalanx.  There is no acute paronychia.  There is no felon. She is nontender throughout the distal and nontender along the flexor sheath at the proximal phalanx and metacarpal region.  I have reviewed this at length with her.  Her x-ray report is negative and reviewed.  I have reviewed this with her at length.  IMPRESSION:  Early cellulitis about the left small finger after some repetitions movement yesterday with no obvious abscess, but certainly some soft tissue cellulitis indicative of infection.  PLAN:  I have given her 2 g of Rocephin IM.  I placed her on Bactrim DS 1 p.o. b.i.d. as well as Keflex 500 mg 1 p.o. q.i.d.  I am going to ask her to elevate, keep the area clean and dry.  I have given her splint and noncompressive wrap to keep the finger at rest.  I have discussed that typically our goal is to treat this with antibiotics and let it declare itself.  If it worsens then she develops a focal abscess, I and D will be to rule.  If she improves with antibiotics and the cellulitic process is contained then certainly she will not need to the face of surgery.  I have given her Vicodin for pain, asked to notify me should any problems.  I am going to follow issues with her in detail.  She is to call on-call answering service tomorrow, and then I will call her directly back to check on her progress.  She understands if she acutely worsens to return to the emergency room immediately.     Dionne Ano. Amanda Pea, M.D.     [facility]  D:  06/16/2011  T:  06/17/2011  Job:  [id number]  Electronically Signed by Dominica Severin M.D. on 06/26/2011 06:31:00 AM

## 2011-06-30 ENCOUNTER — Emergency Department (HOSPITAL_COMMUNITY)
Admission: EM | Admit: 2011-06-30 | Discharge: 2011-06-30 | Disposition: A | Discharge status: Home / Self Care | Payer: Medicaid Other | Attending: Emergency Medicine | Admitting: Emergency Medicine

## 2011-06-30 DIAGNOSIS — I10 Essential (primary) hypertension: Secondary | ICD-10-CM | POA: Insufficient documentation

## 2011-06-30 DIAGNOSIS — I7300 Raynaud's syndrome without gangrene: Secondary | ICD-10-CM | POA: Insufficient documentation

## 2011-06-30 DIAGNOSIS — R21 Rash and other nonspecific skin eruption: Secondary | ICD-10-CM | POA: Insufficient documentation

## 2011-06-30 DIAGNOSIS — L298 Other pruritus: Secondary | ICD-10-CM | POA: Insufficient documentation

## 2011-06-30 DIAGNOSIS — L2989 Other pruritus: Secondary | ICD-10-CM | POA: Insufficient documentation

## 2011-06-30 DIAGNOSIS — L509 Urticaria, unspecified: Secondary | ICD-10-CM | POA: Insufficient documentation

## 2011-07-13 ENCOUNTER — Other Ambulatory Visit: Payer: Self-pay | Admitting: Family Medicine

## 2011-07-13 NOTE — Telephone Encounter (Signed)
Refill request

## 2011-09-26 ENCOUNTER — Emergency Department (HOSPITAL_COMMUNITY)
Admission: EM | Admit: 2011-09-26 | Discharge: 2011-09-26 | Disposition: A | Discharge status: Home / Self Care | Payer: Medicaid Other | Attending: Emergency Medicine | Admitting: Emergency Medicine

## 2011-09-26 ENCOUNTER — Emergency Department (HOSPITAL_COMMUNITY): Payer: Medicaid Other

## 2011-09-26 ENCOUNTER — Encounter (HOSPITAL_COMMUNITY): Payer: Self-pay | Admitting: Emergency Medicine

## 2011-09-26 DIAGNOSIS — M7989 Other specified soft tissue disorders: Secondary | ICD-10-CM | POA: Insufficient documentation

## 2011-09-26 DIAGNOSIS — M25559 Pain in unspecified hip: Secondary | ICD-10-CM

## 2011-09-26 DIAGNOSIS — I10 Essential (primary) hypertension: Secondary | ICD-10-CM | POA: Insufficient documentation

## 2011-09-26 DIAGNOSIS — R220 Localized swelling, mass and lump, head: Secondary | ICD-10-CM | POA: Insufficient documentation

## 2011-09-26 DIAGNOSIS — M1990 Unspecified osteoarthritis, unspecified site: Secondary | ICD-10-CM | POA: Insufficient documentation

## 2011-09-26 DIAGNOSIS — F319 Bipolar disorder, unspecified: Secondary | ICD-10-CM | POA: Insufficient documentation

## 2011-09-26 DIAGNOSIS — E669 Obesity, unspecified: Secondary | ICD-10-CM | POA: Insufficient documentation

## 2011-09-26 MED ORDER — KETOROLAC TROMETHAMINE 30 MG/ML IJ SOLN
30.0000 mg | Freq: Once | INTRAMUSCULAR | Status: AC
  Administered 2011-09-26: 30 mg via INTRAVENOUS
  Filled 2011-09-26: qty 1

## 2011-09-26 MED ORDER — ONDANSETRON HCL 8 MG PO TABS: 8.0000 mg | ORAL_TABLET | Freq: Two times a day (BID) | ORAL | Status: DC | PRN

## 2011-09-26 MED ORDER — OXYCODONE-ACETAMINOPHEN 5-325 MG PO TABS
2.0000 | ORAL_TABLET | Freq: Once | ORAL | Status: AC
  Administered 2011-09-26: 2 via ORAL
  Filled 2011-09-26: qty 2

## 2011-09-26 MED ORDER — ONDANSETRON HCL 4 MG/2ML IJ SOLN
4.0000 mg | Freq: Once | INTRAMUSCULAR | Status: AC
  Administered 2011-09-26: 4 mg via INTRAVENOUS
  Filled 2011-09-26: qty 2

## 2011-09-26 MED ORDER — PREDNISONE 10 MG PO TABS: 20.0000 mg | ORAL_TABLET | Freq: Every day | ORAL | Status: DC

## 2011-09-26 MED ORDER — PREDNISONE 20 MG PO TABS
60.0000 mg | ORAL_TABLET | Freq: Once | ORAL | Status: AC
  Administered 2011-09-26: 60 mg via ORAL
  Filled 2011-09-26: qty 3

## 2011-09-26 MED ORDER — PREDNISONE 10 MG PO TABS: 20.0000 mg | ORAL_TABLET | Freq: Every day | ORAL | Status: AC

## 2011-09-26 MED ORDER — CYCLOBENZAPRINE HCL 10 MG PO TABS: 5.0000 mg | ORAL_TABLET | Freq: Once | ORAL | Status: DC

## 2011-09-26 MED ORDER — HYDROMORPHONE HCL PF 1 MG/ML IJ SOLN
1.0000 mg | Freq: Once | INTRAMUSCULAR | Status: AC
  Administered 2011-09-26: 1 mg via INTRAVENOUS
  Filled 2011-09-26: qty 1

## 2011-09-26 MED ORDER — ONDANSETRON HCL 4 MG PO TABS: 4.0000 mg | ORAL_TABLET | Freq: Four times a day (QID) | ORAL | Status: DC

## 2011-09-26 MED ORDER — OXYCODONE-ACETAMINOPHEN 5-325 MG PO TABS: 1.0000 | ORAL_TABLET | Freq: Four times a day (QID) | ORAL | Status: AC | PRN

## 2011-09-26 NOTE — ED Notes (Signed)
Patient transported to X-ray 

## 2011-09-26 NOTE — ED Notes (Signed)
Pt called in triage wait and general wait

## 2011-09-26 NOTE — ED Notes (Signed)
Left hip hurting also

## 2011-09-26 NOTE — ED Notes (Signed)
Called radiology to address delay with xray.  They are on their way to come get her.

## 2011-09-26 NOTE — ED Notes (Signed)
Pt continues to have pain in her bilateral hips.  Pt is tearful.  Apologized for the delay to her.  EDPA was notified of pt hip pain earlier prior to pt being taken for first xray but was under the impression that this was due to her chronic problems since childhood.  Pt tells me that the bilateral hip pain has been increased and pain is radiating down right leg.  No weakness and no numbness with this.  Pt states that she can not continue to do the same activity for a long period of time.  Pt denies any trauma.  Pt has blankets for comfort.  POC discussed with PA, await hip x ray now.

## 2011-09-26 NOTE — ED Notes (Signed)
Pt here with c/o pain in swelling ibn right side of neck.  Pt also c/o left side hip pain x 2 weeks.  Pt rates pain 8/10.

## 2011-09-26 NOTE — ED Provider Notes (Signed)
History     CSN: [id number] Arrival date & time: 09/26/2011  4:08 PM   First MD Initiated Contact with Patient 09/26/11 2016      Chief Complaint  Patient presents with  . Facial Swelling    (Consider location/radiation/quality/duration/timing/severity/associated sxs/prior treatment) The history is provided by the patient.    Pt presents to the ER with complaints of left hip pain, which is chronic since childhood and neck swelling and shoulder swelling. Upon my examination, the pt states that the neck swelling had completely resolved but her hip was hurting very badly. She says that she has sciatica but wanted xrays to have it checked out. Denies any weakness and fevers.  Past Medical History  Diagnosis Date  . OA (osteoarthritis)   . HTN (hypertension)   . Obesity   . Bipolar 1 disorder   . Depression   . Depression     History reviewed. No pertinent past surgical history.  No family history on file.  History  Substance Use Topics  . Smoking status: Never Smoker   . Smokeless tobacco: Never Used  . Alcohol Use: Yes    OB History    Grav Para Term Preterm Abortions TAB SAB Ect Mult Living                  Review of Systems  All other systems reviewed and are negative.    Allergies  Vicodin  Home Medications   Current Outpatient Rx  Name Route Sig Dispense Refill  . VITAMIN C PO Oral Take 2 tablets by mouth daily.      Marland Kitchen CALCIUM CARBONATE-VITAMIN D 600-400 MG-UNIT PO TABS Oral Take 1 tablet by mouth 2 (two) times daily with meals.      . DESVENLAFAXINE SUCCINATE 100 MG PO TB24 Oral Take 100 mg by mouth daily.      Marland Kitchen DICLOFENAC SODIUM 75 MG PO TBEC Oral Take 75 mg by mouth 2 (two) times daily.      Marland Kitchen HYDROCHLOROTHIAZIDE 25 MG PO TABS  take 1 tablet by mouth once daily for blood pressure 90 tablet 1  . LAMOTRIGINE 200 MG PO TABS Oral Take 200 mg by mouth every morning.      Marland Kitchen METOPROLOL SUCCINATE 25 MG PO TB24  take 1 tablet by mouth once daily for blood  pressure 31 tablet 3  . NORTRIPTYLINE HCL 25 MG PO CAPS  take 1 capsule by mouth every evening 31 capsule 2  . OMEPRAZOLE 20 MG PO CPDR Oral Take 20 mg by mouth daily.      . OXYCODONE-ACETAMINOPHEN 5-325 MG PO TABS Oral Take 1 tablet by mouth every 4 (four) hours as needed. For pain     . POTASSIUM PO Oral Take 1 tablet by mouth daily.      Marland Kitchen ONDANSETRON HCL 8 MG PO TABS Oral Take 8 mg by mouth 2 (two) times daily as needed. For nausea      BP 114/55  Pulse 64  Temp(Src) 98.1 F (36.7 C) (Oral)  Resp 20  SpO2 95%  Physical Exam  Nursing note and vitals reviewed. Constitutional: She appears well-developed and well-nourished.  HENT:  Head: Normocephalic and atraumatic.  Eyes: Conjunctivae are normal. Pupils are equal, round, and reactive to light.  Neck: Trachea normal, normal range of motion and full passive range of motion without pain. Neck supple.    Cardiovascular: Normal rate, regular rhythm and normal pulses.   Pulmonary/Chest: Effort normal and breath sounds normal. Chest wall  is not dull to percussion. She exhibits no tenderness, no crepitus, no edema, no deformity and no retraction.  Abdominal: Soft. Normal appearance and bowel sounds are normal.  Musculoskeletal: Normal range of motion.       Legs: Lymphadenopathy:       Head (right side): No submental, no submandibular, no tonsillar, no preauricular, no posterior auricular and no occipital adenopathy present.       Head (left side): No submental, no submandibular, no tonsillar, no preauricular, no posterior auricular and no occipital adenopathy present.    She has no cervical adenopathy.    She has no axillary adenopathy.  Neurological: She is alert. She has normal strength.  Skin: Skin is warm, dry and intact.  Psychiatric: She has a normal mood and affect. Her speech is normal and behavior is normal. Judgment and thought content normal. Cognition and memory are normal.    ED Course  Procedures (including critical  care time)  Labs Reviewed - No data to display No results found.   No diagnosis found.    MDM  Pt had been waiting 6 hours before I arrived on my shift which is the reason for the delay of care.  pts pain controlled and images are negative.       Dorthula Matas, PA 09/26/11 2303

## 2011-09-26 NOTE — ED Notes (Signed)
Pt crying, c/o severe right hip pain. Pt states the percocet helped her shoulder but hasnt touched her hip. Information relayed to tiffany, PA and primary nurse Alvino Chapel notified.

## 2011-09-26 NOTE — ED Notes (Signed)
Pt called in main triage and general waiting

## 2011-09-26 NOTE — ED Provider Notes (Signed)
Medical screening examination/treatment/procedure(s) were performed by non-physician practitioner and as supervising physician I was immediately available for consultation/collaboration.   Rolan Bucco, MD 09/26/11 [phone]

## 2011-09-26 NOTE — ED Notes (Signed)
Rt side of jaw and neck  swelling since last night  No injury no sorethroat  Just got meds for her bronchititis  Last week from her primary dr

## 2011-09-26 NOTE — ED Notes (Signed)
Patient is resting comfortably. 

## 2011-09-26 NOTE — ED Notes (Signed)
Called pt trhird time, no answer

## 2011-10-20 ENCOUNTER — Encounter (HOSPITAL_COMMUNITY): Payer: Self-pay | Admitting: *Deleted

## 2011-10-20 ENCOUNTER — Emergency Department (HOSPITAL_COMMUNITY)
Admission: EM | Admit: 2011-10-20 | Discharge: 2011-10-20 | Disposition: A | Discharge status: Home / Self Care | Payer: Self-pay | Attending: Emergency Medicine | Admitting: Emergency Medicine

## 2011-10-20 DIAGNOSIS — M1990 Unspecified osteoarthritis, unspecified site: Secondary | ICD-10-CM | POA: Insufficient documentation

## 2011-10-20 DIAGNOSIS — Z79899 Other long term (current) drug therapy: Secondary | ICD-10-CM | POA: Insufficient documentation

## 2011-10-20 DIAGNOSIS — M545 Low back pain, unspecified: Secondary | ICD-10-CM | POA: Insufficient documentation

## 2011-10-20 DIAGNOSIS — M5430 Sciatica, unspecified side: Secondary | ICD-10-CM | POA: Insufficient documentation

## 2011-10-20 DIAGNOSIS — M79609 Pain in unspecified limb: Secondary | ICD-10-CM | POA: Insufficient documentation

## 2011-10-20 DIAGNOSIS — F3130 Bipolar disorder, current episode depressed, mild or moderate severity, unspecified: Secondary | ICD-10-CM | POA: Insufficient documentation

## 2011-10-20 DIAGNOSIS — I10 Essential (primary) hypertension: Secondary | ICD-10-CM | POA: Insufficient documentation

## 2011-10-20 MED ORDER — PREDNISONE 20 MG PO TABS
60.0000 mg | ORAL_TABLET | Freq: Once | ORAL | Status: AC
  Administered 2011-10-20: 60 mg via ORAL
  Filled 2011-10-20: qty 3

## 2011-10-20 MED ORDER — DIAZEPAM 5 MG PO TABS
5.0000 mg | ORAL_TABLET | Freq: Once | ORAL | Status: AC
  Administered 2011-10-20: 5 mg via ORAL
  Filled 2011-10-20: qty 1

## 2011-10-20 MED ORDER — DIAZEPAM 5 MG PO TABS: 5.0000 mg | ORAL_TABLET | Freq: Two times a day (BID) | ORAL | Status: AC

## 2011-10-20 MED ORDER — KETOROLAC TROMETHAMINE 60 MG/2ML IM SOLN
60.0000 mg | Freq: Once | INTRAMUSCULAR | Status: AC
  Administered 2011-10-20: 60 mg via INTRAMUSCULAR
  Filled 2011-10-20: qty 2

## 2011-10-20 MED ORDER — MORPHINE SULFATE 4 MG/ML IJ SOLN
4.0000 mg | Freq: Once | INTRAMUSCULAR | Status: AC
  Administered 2011-10-20: 4 mg via INTRAMUSCULAR
  Filled 2011-10-20: qty 1

## 2011-10-20 MED ORDER — PREDNISONE 50 MG PO TABS: 50.0000 mg | ORAL_TABLET | Freq: Every day | ORAL | Status: AC

## 2011-10-20 NOTE — ED Notes (Signed)
The pt has lower back pain for one week with the pain radiating down both legs

## 2011-10-20 NOTE — ED Notes (Signed)
Patient states history of lower back pain radiating to bilateral lower extremities. Recently pain worsening overtime and currently pain 10/10 achy sharp lower left and right back radiating to buttocks and bilateral lower extremities. Full sensation all extremities with no loss of Bowel or urine function.

## 2011-10-20 NOTE — ED Provider Notes (Signed)
History     CSN: [id number] Arrival date & time: 10/20/2011  5:32 PM   First MD Initiated Contact with Patient 10/20/11 1801      Chief Complaint  Patient presents with  . 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  She intermittently has exacerbations with pain radiating down both legs.  She has not any surgeries in the past.  She notes no increase in weakness, numbness or any bowel or bladder incontinence.  Patient has no fevers.  No history of IV drug abuse.  No night sweats.  She's been using her Percocet at home prescribed by her primary care physician and it did not control her pain today which is why she presents to the emergency department.  Patient is a 45 y.o. female presenting with back pain. The history is provided by the patient. No language interpreter was used.  Back Pain  This is a chronic problem. The current episode started more than 1 week ago. The problem occurs constantly. The problem has been gradually worsening. The pain is associated with no known injury. The pain is present in the lumbar spine. The pain radiates to the right thigh and left thigh. The pain is severe. The symptoms are aggravated by bending. Pertinent negatives include no chest pain, no fever, no headaches, no abdominal pain and no dysuria.    Past Medical History  Diagnosis Date  . OA (osteoarthritis)   . HTN (hypertension)   . Obesity   . Bipolar 1 disorder   . Depression   . Depression     History reviewed. No pertinent past surgical history.  History reviewed. No pertinent family history.  History  Substance Use Topics  . Smoking status: Never Smoker   . Smokeless tobacco: Never Used  . Alcohol Use: Yes    OB History    Grav Para Term Preterm Abortions TAB SAB Ect Mult Living                  Review of Systems    Constitutional: Negative.  Negative for fever and chills.  HENT: Negative.   Eyes: Negative.  Negative for discharge and redness.  Respiratory: Negative.  Negative for cough and shortness of breath.   Cardiovascular: Negative.  Negative for chest pain.  Gastrointestinal: Negative.  Negative for nausea, vomiting, abdominal pain and diarrhea.  Genitourinary: Negative.  Negative for dysuria and vaginal discharge.  Musculoskeletal: Positive for back pain.  Skin: Negative.  Negative for color change and rash.  Neurological: Negative.  Negative for syncope and headaches.  Hematological: Negative.  Negative for adenopathy.  Psychiatric/Behavioral: Negative.  Negative for confusion.  All other systems reviewed and are negative.    Allergies  Vicodin  Home Medications   Current Outpatient Rx  Name Route Sig Dispense Refill  . VITAMIN C PO Oral Take 2 tablets by mouth daily.      Marland Kitchen CALCIUM CARBONATE-VITAMIN D 600-400 MG-UNIT PO TABS Oral Take 1 tablet by mouth 2 (two) times daily with meals.      . DESVENLAFAXINE SUCCINATE ER 100 MG PO TB24 Oral Take 100 mg by mouth daily.      Marland Kitchen HYDROCHLOROTHIAZIDE 25 MG PO TABS  take 1 tablet by mouth once daily for blood pressure 90 tablet 1  . LAMOTRIGINE 200 MG PO TABS Oral Take 200 mg by mouth every morning.      Marland Kitchen  METOPROLOL SUCCINATE ER 25 MG PO TB24  take 1 tablet by mouth once daily for blood pressure 31 tablet 3  . NORTRIPTYLINE HCL 25 MG PO CAPS  take 1 capsule by mouth every evening 31 capsule 2  . OMEPRAZOLE 20 MG PO CPDR Oral Take 20 mg by mouth daily.      Marland Kitchen ONDANSETRON HCL 8 MG PO TABS Oral Take 1 tablet (8 mg total) by mouth 2 (two) times daily as needed. For nausea 12 tablet 0  . OXYCODONE-ACETAMINOPHEN 5-325 MG PO TABS Oral Take 1 tablet by mouth every 4 (four) hours as needed. For pain     . POTASSIUM PO Oral Take 1 tablet by mouth daily. Over the counter SAMS potassium.    Marland Kitchen DIAZEPAM 5 MG PO TABS Oral Take 1 tablet (5 mg total) by  mouth 2 (two) times daily. 10 tablet 0  . ONDANSETRON HCL 4 MG PO TABS Oral Take 1 tablet (4 mg total) by mouth every 6 (six) hours. 12 tablet 0  . PREDNISONE 50 MG PO TABS Oral Take 1 tablet (50 mg total) by mouth daily. 5 tablet 0    BP 116/78  Pulse 65  Temp(Src) 97.6 F (36.4 C) (Oral)  Resp 14  SpO2 97%  LMP 10/17/2011  Physical Exam  Constitutional: She is oriented to person, place, and time. She appears well-developed and well-nourished.  Non-toxic appearance. She does not have a sickly appearance.  HENT:  Head: Normocephalic and atraumatic.  Eyes: Conjunctivae, EOM and lids are normal. Pupils are equal, round, and reactive to light. No scleral icterus.  Neck: Trachea normal and normal range of motion. Neck supple.  Cardiovascular: Normal rate, regular rhythm and normal heart sounds.   Pulmonary/Chest: Effort normal and breath sounds normal. No respiratory distress. She has no wheezes. She has no rales.  Abdominal: Soft. Normal appearance. There is no tenderness. There is no rebound, no guarding and no CVA tenderness.  Musculoskeletal: Normal range of motion.       Palpable DP pulses bilaterally.  Capillary refill less than 2 seconds.  Patient has no focal L-spine tenderness on exam.  Sensation intact bilaterally.  Patient able to ambulate.  Neurological: She is alert and oriented to person, place, and time. She has normal strength.  Skin: Skin is warm, dry and intact. No rash noted.  Psychiatric: She has a normal mood and affect. Her behavior is normal. Judgment and thought content normal.    ED Course  Procedures (including critical care time)  Labs Reviewed - No data to display No results found.   1. Sciatica       MDM  Patient with no acute neurologic deficit on exam.  Palpable pulses bilaterally no significant increase in any weakness, numbness or bowel or bladder incontinence.  Patient appears to have acute on chronic exacerbation of her sciatic pain.  She does  have a primary care physician whom she follows with and has Percocet at home.  She knows to followup with them next week for further reevaluation the        Nat Christen, MD 10/20/11 [phone]

## 2011-10-20 NOTE — ED Notes (Signed)
Patient given discharge paperwork; went over discharge instructions with patient.  Informed patient to take prescriptions as directed, to follow up with Dr. Katrinka Blazing in 3 days, and to return to the ED for new, worsening, or concerning symptoms.

## 2011-10-20 NOTE — ED Notes (Signed)
Nurse tech at bedside collecting discharge vital signs.

## 2011-10-28 ENCOUNTER — Emergency Department (HOSPITAL_COMMUNITY)
Admission: EM | Admit: 2011-10-28 | Discharge: 2011-10-28 | Disposition: A | Discharge status: Home / Self Care | Payer: Self-pay | Attending: Emergency Medicine | Admitting: Emergency Medicine

## 2011-10-28 ENCOUNTER — Emergency Department (HOSPITAL_COMMUNITY): Payer: Self-pay

## 2011-10-28 ENCOUNTER — Encounter (HOSPITAL_COMMUNITY): Payer: Self-pay | Admitting: *Deleted

## 2011-10-28 DIAGNOSIS — F3130 Bipolar disorder, current episode depressed, mild or moderate severity, unspecified: Secondary | ICD-10-CM | POA: Insufficient documentation

## 2011-10-28 DIAGNOSIS — Z79899 Other long term (current) drug therapy: Secondary | ICD-10-CM | POA: Insufficient documentation

## 2011-10-28 DIAGNOSIS — M545 Low back pain, unspecified: Secondary | ICD-10-CM | POA: Insufficient documentation

## 2011-10-28 DIAGNOSIS — M549 Dorsalgia, unspecified: Secondary | ICD-10-CM

## 2011-10-28 DIAGNOSIS — M1990 Unspecified osteoarthritis, unspecified site: Secondary | ICD-10-CM | POA: Insufficient documentation

## 2011-10-28 DIAGNOSIS — M79609 Pain in unspecified limb: Secondary | ICD-10-CM | POA: Insufficient documentation

## 2011-10-28 DIAGNOSIS — I10 Essential (primary) hypertension: Secondary | ICD-10-CM | POA: Insufficient documentation

## 2011-10-28 HISTORY — DX: Anxiety disorder, unspecified: F41.9

## 2011-10-28 HISTORY — DX: Dorsalgia, unspecified: M54.9

## 2011-10-28 MED ORDER — OXYCODONE-ACETAMINOPHEN 5-325 MG PO TABS: 2.0000 | ORAL_TABLET | ORAL | Status: AC | PRN

## 2011-10-28 MED ORDER — OXYCODONE-ACETAMINOPHEN 5-325 MG PO TABS
2.0000 | ORAL_TABLET | Freq: Once | ORAL | Status: AC
  Administered 2011-10-28: 2 via ORAL
  Filled 2011-10-28: qty 2

## 2011-10-28 NOTE — ED Notes (Signed)
Inc. Lower back pain. Radiating down legs. Took percocets x 4 days ago, and is out of them.

## 2011-10-28 NOTE — ED Provider Notes (Signed)
History    this is a 45 year old female with a history of chronic back pain, presenting to the ED with chief complaints of low back pain. Patient states she has been having low back pain intermittently for the past 10 years. For the past week her pain is worsen. Pain is now radiating down to both legs, worse and in the left leg. She has been seen in the ED recently for similar complaint and was diagnosed with sciatica. She was given prednisone and Percocet. Patient states she has ran out of her Percocet. Her pain continues to persist. She is having difficulty ambulating. She denies urinary/bowel incontinence or caudal equina symptoms. She denies fever, rash, IV drug use. She has an orthopedic Dr., who is willing to see her in early January. Patient states she is unable to tolerate the pain without pain medication. She denies any specific injuries. She denies dysuria, or vaginal discharge.  CSN: [id number]  Arrival date & time 10/28/11  1627   First MD Initiated Contact with Patient 10/28/11 1842      Chief Complaint  Patient presents with  . Back Pain    (Consider location/radiation/quality/duration/timing/severity/associated sxs/prior treatment) HPI  Past Medical History  Diagnosis Date  . OA (osteoarthritis)   . HTN (hypertension)   . Obesity   . Bipolar 1 disorder   . Depression   . Depression   . Back pain   . Anxiety     History reviewed. No pertinent past surgical history.  No family history on file.  History  Substance Use Topics  . Smoking status: Never Smoker   . Smokeless tobacco: Never Used  . Alcohol Use: Yes    OB History    Grav Para Term Preterm Abortions TAB SAB Ect Mult Living                  Review of Systems  All other systems reviewed and are negative.    Allergies  Vicodin  Home Medications   Current Outpatient Rx  Name Route Sig Dispense Refill  . VITAMIN C PO Oral Take 2 tablets by mouth daily.      Marland Kitchen CALCIUM CARBONATE-VITAMIN D  600-400 MG-UNIT PO TABS Oral Take 1 tablet by mouth 2 (two) times daily with meals.      . DESVENLAFAXINE SUCCINATE ER 100 MG PO TB24 Oral Take 100 mg by mouth daily.      Marland Kitchen DIAZEPAM 5 MG PO TABS Oral Take 1 tablet (5 mg total) by mouth 2 (two) times daily. 10 tablet 0  . HYDROCHLOROTHIAZIDE 25 MG PO TABS  take 1 tablet by mouth once daily for blood pressure 90 tablet 1  . LAMOTRIGINE 200 MG PO TABS Oral Take 200 mg by mouth every morning.      Marland Kitchen METOPROLOL SUCCINATE ER 25 MG PO TB24  take 1 tablet by mouth once daily for blood pressure 31 tablet 3  . NORTRIPTYLINE HCL 25 MG PO CAPS  take 1 capsule by mouth every evening 31 capsule 2  . OMEPRAZOLE 20 MG PO CPDR Oral Take 20 mg by mouth daily.      . OXYCODONE-ACETAMINOPHEN 5-325 MG PO TABS Oral Take 1 tablet by mouth every 4 (four) hours as needed. For pain     . PHENTERMINE HCL 37.5 MG PO CAPS Oral Take 37.5 mg by mouth every morning.      Marland Kitchen POTASSIUM PO Oral Take 1 tablet by mouth daily. Over the counter SAMS potassium.    Marland Kitchen  PREDNISONE 50 MG PO TABS Oral Take 1 tablet (50 mg total) by mouth daily. 5 tablet 0  . ONDANSETRON HCL 4 MG PO TABS Oral Take 1 tablet (4 mg total) by mouth every 6 (six) hours. 12 tablet 0    BP 110/80  Pulse 78  Temp(Src) 97.1 F (36.2 C) (Oral)  Resp 16  SpO2 99%  LMP 10/17/2011  Physical Exam  Constitutional: She appears well-developed and well-nourished.       Morbidly obese.  Tearful  Abdominal: Soft.  Musculoskeletal:       Lumbar back: She exhibits decreased range of motion, tenderness and bony tenderness. She exhibits no swelling, no edema and no deformity.  Neurological:       Difficult to assess for deep tendon reflex due to body habitus. No evidence of footdrop noted.    ED Course  Procedures (including critical care time)  Labs Reviewed - No data to display No results found.   No diagnosis found.    MDM  Patient with persistent low back pain. This could be likely related to sciatica. I  suspect her body habitus is a contributing factor.  Xray shows no acute finding.  Pt still exhibits moderate amount of pain.  She does have f/u appointment with Delbert Harness.  Will prescribe short course of pain meds in the mean time.  Pt voice understanding and agreement with plan.        Fayrene Helper, [place] 10/28/11 2114

## 2011-10-29 NOTE — ED Provider Notes (Signed)
Medical screening examination/treatment/procedure(s) were performed by non-physician practitioner and as supervising physician I was immediately available for consultation/collaboration.   Law Corsino, MD 10/29/11 1247 

## 2011-11-10 ENCOUNTER — Emergency Department (HOSPITAL_COMMUNITY)
Admission: EM | Admit: 2011-11-10 | Discharge: 2011-11-10 | Disposition: A | Discharge status: Home / Self Care | Payer: Self-pay | Attending: Emergency Medicine | Admitting: Emergency Medicine

## 2011-11-10 ENCOUNTER — Encounter (HOSPITAL_COMMUNITY): Payer: Self-pay | Admitting: *Deleted

## 2011-11-10 DIAGNOSIS — F411 Generalized anxiety disorder: Secondary | ICD-10-CM | POA: Insufficient documentation

## 2011-11-10 DIAGNOSIS — M1990 Unspecified osteoarthritis, unspecified site: Secondary | ICD-10-CM | POA: Insufficient documentation

## 2011-11-10 DIAGNOSIS — M79609 Pain in unspecified limb: Secondary | ICD-10-CM | POA: Insufficient documentation

## 2011-11-10 DIAGNOSIS — W268XXA Contact with other sharp object(s), not elsewhere classified, initial encounter: Secondary | ICD-10-CM | POA: Insufficient documentation

## 2011-11-10 DIAGNOSIS — I10 Essential (primary) hypertension: Secondary | ICD-10-CM | POA: Insufficient documentation

## 2011-11-10 DIAGNOSIS — IMO0002 Reserved for concepts with insufficient information to code with codable children: Secondary | ICD-10-CM

## 2011-11-10 DIAGNOSIS — Z79899 Other long term (current) drug therapy: Secondary | ICD-10-CM | POA: Insufficient documentation

## 2011-11-10 DIAGNOSIS — S61209A Unspecified open wound of unspecified finger without damage to nail, initial encounter: Secondary | ICD-10-CM | POA: Insufficient documentation

## 2011-11-10 DIAGNOSIS — F3130 Bipolar disorder, current episode depressed, mild or moderate severity, unspecified: Secondary | ICD-10-CM | POA: Insufficient documentation

## 2011-11-10 MED ORDER — LIDOCAINE HCL 1 % IJ SOLN
INTRAMUSCULAR | Status: AC
  Administered 2011-11-10: 20 mL via INTRAMUSCULAR
  Filled 2011-11-10: qty 20

## 2011-11-10 MED ORDER — TETANUS-DIPHTH-ACELL PERTUSSIS 5-2.5-18.5 LF-MCG/0.5 IM SUSP
0.5000 mL | Freq: Once | INTRAMUSCULAR | Status: AC
  Administered 2011-11-10: 0.5 mL via INTRAMUSCULAR
  Filled 2011-11-10: qty 0.5

## 2011-11-10 MED ORDER — OXYCODONE-ACETAMINOPHEN 5-325 MG PO TABS: 1.0000 | ORAL_TABLET | Freq: Four times a day (QID) | ORAL | Status: AC | PRN

## 2011-11-10 MED ORDER — LIDOCAINE HCL (PF) 1 % IJ SOLN
5.0000 mL | Freq: Once | INTRAMUSCULAR | Status: DC
  Filled 2011-11-10: qty 5

## 2011-11-10 NOTE — ED Provider Notes (Signed)
History     CSN: [id number]  Arrival date & time 11/10/11  1733   First MD Initiated Contact with Patient 11/10/11 1927      Chief Complaint  Patient presents with  . Laceration    pt cut finger on can of corn. pt has lac to right thumb, bleeding controlled.     (Consider location/radiation/quality/duration/timing/severity/associated sxs/prior treatment) HPI Comments: Pt cut thumb on metal can of corn while taking out garbage earlier today. Tetanus out of date.   [name] presenting with skin laceration. The history is provided by the patient.  Laceration  The incident occurred 3 to 5 hours ago. Pain location: right thumb  The laceration is 2 cm in size. Injury mechanism: metal can of corn top  The pain is at a severity of 3/10. The pain is mild. The pain has been constant since onset. She reports no foreign bodies present. Her tetanus status is out of date.    Past Medical History  Diagnosis Date  . OA (osteoarthritis)   . HTN (hypertension)   . Obesity   . Bipolar 1 disorder   . Depression   . Depression   . Back pain   . Anxiety     History reviewed. No pertinent past surgical history.  History reviewed. No pertinent family history.  History  Substance Use Topics  . Smoking status: Never Smoker   . Smokeless tobacco: Never Used  . Alcohol Use: Yes     occ    OB History    Grav Para Term Preterm Abortions TAB SAB Ect Mult Living                  Review of Systems  Constitutional: Negative for fever and chills.  Skin: Positive for wound (laceration ).  All other systems reviewed and are negative.    Allergies  Vicodin  Home Medications   Current Outpatient Rx  Name Route Sig Dispense Refill  . VITAMIN C PO Oral Take 2 tablets by mouth daily.      Marland Kitchen CALCIUM CARBONATE-VITAMIN D 600-400 MG-UNIT PO TABS Oral Take 1 tablet by mouth 2 (two) times daily with meals.      . DESVENLAFAXINE SUCCINATE ER 100 MG PO TB24 Oral Take 100 mg by  mouth daily.      Marland Kitchen HYDROCHLOROTHIAZIDE 25 MG PO TABS  take 1 tablet by mouth once daily for blood pressure 90 tablet 1  . LAMOTRIGINE 200 MG PO TABS Oral Take 200 mg by mouth every morning.      Marland Kitchen METOPROLOL SUCCINATE ER 25 MG PO TB24  take 1 tablet by mouth once daily for blood pressure 31 tablet 3  . NORTRIPTYLINE HCL 25 MG PO CAPS  take 1 capsule by mouth every evening 31 capsule 2  . OMEPRAZOLE 20 MG PO CPDR Oral Take 20 mg by mouth daily.      . OXYCODONE-ACETAMINOPHEN 5-325 MG PO TABS Oral Take 1 tablet by mouth every 4 (four) hours as needed. For pain     . PHENTERMINE HCL 37.5 MG PO CAPS Oral Take 37.5 mg by mouth every morning.      Marland Kitchen POTASSIUM PO Oral Take 1 tablet by mouth daily. Over the counter SAMS potassium.    Marland Kitchen ONDANSETRON HCL 4 MG PO TABS Oral Take 1 tablet (4 mg total) by mouth every 6 (six) hours. 12 tablet 0    BP 107/67  Pulse 71  Temp(Src) 98.4 F (36.9  C) (Oral)  Resp 24  SpO2 100%  LMP 11/10/2011  Physical Exam  Nursing note and vitals reviewed. Constitutional: She is oriented to person, place, and time. She appears well-developed and well-nourished. No distress.  HENT:  Head: Normocephalic and atraumatic.  Eyes:       Normal appearance  Neck: Normal range of motion.  Musculoskeletal: Normal range of motion.       Normal ROM thumb to pinky, DIP & PIP. Sensation intact.  Neurological: She is alert and oriented to person, place, and time.  Skin: Skin is warm. Laceration noted.       2 cm lac on dorsal surface of thumb. No bony exposure.   Psychiatric: She has a normal mood and affect. Her behavior is normal.    ED Course  Procedures (including critical care time)  Labs Reviewed - No data to display No results found.   No diagnosis found.  LACERATION REPAIR Performed by: Jaci Carrel Authorized by: Jaci Carrel Consent: Verbal consent obtained. Risks and benefits: risks, benefits and alternatives were discussed Consent given by:  patient Patient identity confirmed: provided demographic data Prepped and Draped in normal sterile fashion Wound explored  Laceration Location: dorsal proximal thumb   Laceration Length: 2cm  No Foreign Bodies seen or palpated  Anesthesia: local infiltration  Local anesthetic: lidocaine 1% with out  epinephrine  Anesthetic total: 1.5 ml  Irrigation method: syringe Amount of cleaning: standard  Skin closure: 4.0 prolene  Number of sutures: 2  Technique: simple interupted  Patient tolerance: Patient tolerated the procedure well with no immediate complications.  MDM  Laceration         Jaci Carrel, [place] 11/10/11 2018

## 2011-11-10 NOTE — ED Provider Notes (Signed)
Medical screening examination/treatment/procedure(s) were performed by non-physician practitioner and as supervising physician I was immediately available for consultation/collaboration.  Donnetta Hutching, MD 11/10/11 2302

## 2011-11-24 ENCOUNTER — Other Ambulatory Visit: Payer: Self-pay

## 2011-11-24 ENCOUNTER — Emergency Department (HOSPITAL_COMMUNITY): Payer: Medicaid Other

## 2011-11-24 ENCOUNTER — Inpatient Hospital Stay (HOSPITAL_COMMUNITY)
Admission: EM | Admit: 2011-11-24 | Discharge: 2011-11-28 | DRG: 313 | Disposition: A | Discharge status: Home / Self Care | Payer: Medicaid Other | Attending: Internal Medicine | Admitting: Internal Medicine

## 2011-11-24 DIAGNOSIS — M5430 Sciatica, unspecified side: Secondary | ICD-10-CM | POA: Diagnosis present

## 2011-11-24 DIAGNOSIS — I10 Essential (primary) hypertension: Secondary | ICD-10-CM

## 2011-11-24 DIAGNOSIS — E669 Obesity, unspecified: Secondary | ICD-10-CM

## 2011-11-24 DIAGNOSIS — F3130 Bipolar disorder, current episode depressed, mild or moderate severity, unspecified: Secondary | ICD-10-CM

## 2011-11-24 DIAGNOSIS — I872 Venous insufficiency (chronic) (peripheral): Secondary | ICD-10-CM | POA: Diagnosis present

## 2011-11-24 DIAGNOSIS — R0989 Other specified symptoms and signs involving the circulatory and respiratory systems: Secondary | ICD-10-CM | POA: Diagnosis present

## 2011-11-24 DIAGNOSIS — R0789 Other chest pain: Principal | ICD-10-CM | POA: Diagnosis present

## 2011-11-24 DIAGNOSIS — F411 Generalized anxiety disorder: Secondary | ICD-10-CM | POA: Diagnosis present

## 2011-11-24 DIAGNOSIS — N946 Dysmenorrhea, unspecified: Secondary | ICD-10-CM

## 2011-11-24 DIAGNOSIS — M1990 Unspecified osteoarthritis, unspecified site: Secondary | ICD-10-CM | POA: Diagnosis present

## 2011-11-24 DIAGNOSIS — IMO0002 Reserved for concepts with insufficient information to code with codable children: Secondary | ICD-10-CM

## 2011-11-24 DIAGNOSIS — F319 Bipolar disorder, unspecified: Secondary | ICD-10-CM | POA: Diagnosis present

## 2011-11-24 DIAGNOSIS — K219 Gastro-esophageal reflux disease without esophagitis: Secondary | ICD-10-CM

## 2011-11-24 DIAGNOSIS — Z888 Allergy status to other drugs, medicaments and biological substances status: Secondary | ICD-10-CM

## 2011-11-24 DIAGNOSIS — M67919 Unspecified disorder of synovium and tendon, unspecified shoulder: Secondary | ICD-10-CM

## 2011-11-24 DIAGNOSIS — M549 Dorsalgia, unspecified: Secondary | ICD-10-CM | POA: Diagnosis present

## 2011-11-24 DIAGNOSIS — E559 Vitamin D deficiency, unspecified: Secondary | ICD-10-CM

## 2011-11-24 DIAGNOSIS — M719 Bursopathy, unspecified: Secondary | ICD-10-CM | POA: Diagnosis present

## 2011-11-24 DIAGNOSIS — R609 Edema, unspecified: Secondary | ICD-10-CM | POA: Diagnosis present

## 2011-11-24 DIAGNOSIS — E876 Hypokalemia: Secondary | ICD-10-CM | POA: Diagnosis present

## 2011-11-24 DIAGNOSIS — G4700 Insomnia, unspecified: Secondary | ICD-10-CM

## 2011-11-24 DIAGNOSIS — R079 Chest pain, unspecified: Secondary | ICD-10-CM | POA: Diagnosis present

## 2011-11-24 DIAGNOSIS — R0609 Other forms of dyspnea: Secondary | ICD-10-CM | POA: Diagnosis present

## 2011-11-24 DIAGNOSIS — H9190 Unspecified hearing loss, unspecified ear: Secondary | ICD-10-CM

## 2011-11-24 DIAGNOSIS — M1710 Unilateral primary osteoarthritis, unspecified knee: Secondary | ICD-10-CM

## 2011-11-24 DIAGNOSIS — K589 Irritable bowel syndrome without diarrhea: Secondary | ICD-10-CM

## 2011-11-24 DIAGNOSIS — M722 Plantar fascial fibromatosis: Secondary | ICD-10-CM

## 2011-11-24 DIAGNOSIS — F329 Major depressive disorder, single episode, unspecified: Secondary | ICD-10-CM | POA: Diagnosis present

## 2011-11-24 DIAGNOSIS — F3289 Other specified depressive episodes: Secondary | ICD-10-CM

## 2011-11-24 DIAGNOSIS — I871 Compression of vein: Secondary | ICD-10-CM

## 2011-11-24 DIAGNOSIS — F32A Depression, unspecified: Secondary | ICD-10-CM | POA: Diagnosis present

## 2011-11-24 HISTORY — DX: Sciatica, unspecified side: M54.30

## 2011-11-24 LAB — CBC
HCT: 40.1 % (ref 36.0–46.0)
Hemoglobin: 13.6 g/dL (ref 12.0–15.0)
MCHC: 33.9 g/dL (ref 30.0–36.0)
MCV: 92.2 fL (ref 78.0–100.0)
RDW: 13.3 % (ref 11.5–15.5)

## 2011-11-24 LAB — DIFFERENTIAL
Basophils Absolute: 0 10*3/uL (ref 0.0–0.1)
Basophils Relative: 0 % (ref 0–1)
Eosinophils Relative: 1 % (ref 0–5)
Monocytes Absolute: 0.9 10*3/uL (ref 0.1–1.0)
Monocytes Relative: 8 % (ref 3–12)

## 2011-11-24 LAB — TROPONIN I: Troponin I: <0.3 ng/mL (ref <0.30)

## 2011-11-24 MED ORDER — KETOROLAC TROMETHAMINE 30 MG/ML IJ SOLN
30.0000 mg | Freq: Once | INTRAMUSCULAR | Status: AC
  Administered 2011-11-24: 30 mg via INTRAVENOUS
  Filled 2011-11-24: qty 1

## 2011-11-24 NOTE — ED Notes (Signed)
Received pt. From Home via EMS, pt transferred to stretcher with c/o cp and sob

## 2011-11-24 NOTE — ED Provider Notes (Signed)
History     CSN: [id number]  Arrival date & time 11/24/11  2225   First MD Initiated Contact with Patient 11/24/11 2226      Chief Complaint  Patient presents with  . Chest Pain  . Dizziness    (Consider location/radiation/quality/duration/timing/severity/associated sxs/prior treatment) [name] presenting with chest pain. The history is provided by the patient.  Chest Pain   She has felt dizzy all day today. At about 8:30 PM, she started having midsternal chest heaviness with some radiation to the back. This is moderately severe. The heavy feeling was 9/10 at its worst, and is 6/10 currently it.She was brought in by ambulance where she was given Aspirin and Nitroglycerin. She got slight relief with the nitroglycerin. Pain is worse with movement and taking a deep breath. There is associated dyspnea, but no nausea, vomiting, or diaphoresis. She does relate that she had a catheterization done in 2006 and was told that her arteries were normal at that time.  Past Medical History  Diagnosis Date  . OA (osteoarthritis)   . HTN (hypertension)   . Obesity   . Bipolar 1 disorder   . Depression   . Depression   . Back pain   . Anxiety     No past surgical history on file.  No family history on file.  History  Substance Use Topics  . Smoking status: Never Smoker   . Smokeless tobacco: Never Used  . Alcohol Use: Yes     occ    OB History    Grav Para Term Preterm Abortions TAB SAB Ect Mult Living                  Review of Systems  Cardiovascular: Positive for chest pain.  All other systems reviewed and are negative.    Allergies  Vicodin  Home Medications   Current Outpatient Rx  Name Route Sig Dispense Refill  . VITAMIN C PO Oral Take 2 tablets by mouth daily.      Marland Kitchen CALCIUM CARBONATE-VITAMIN D 600-400 MG-UNIT PO TABS Oral Take 1 tablet by mouth 2 (two) times daily with meals.      . DESVENLAFAXINE SUCCINATE ER 100 MG PO TB24 Oral Take 100  mg by mouth daily.      Marland Kitchen HYDROCHLOROTHIAZIDE 25 MG PO TABS  take 1 tablet by mouth once daily for blood pressure 90 tablet 1  . LAMOTRIGINE 200 MG PO TABS Oral Take 200 mg by mouth every morning.      Marland Kitchen METOPROLOL SUCCINATE ER 25 MG PO TB24  take 1 tablet by mouth once daily for blood pressure 31 tablet 3  . NORTRIPTYLINE HCL 25 MG PO CAPS  take 1 capsule by mouth every evening 31 capsule 2  . OMEPRAZOLE 20 MG PO CPDR Oral Take 20 mg by mouth daily.      . OXYCODONE-ACETAMINOPHEN 5-325 MG PO TABS Oral Take 1 tablet by mouth every 4 (four) hours as needed. For pain     . PHENTERMINE HCL 37.5 MG PO CAPS Oral Take 37.5 mg by mouth every morning.      Marland Kitchen POTASSIUM PO Oral Take 1 tablet by mouth daily. Over the counter SAMS potassium.    Marland Kitchen ONDANSETRON HCL 4 MG PO TABS Oral Take 1 tablet (4 mg total) by mouth every 6 (six) hours. 12 tablet 0    BP 111/65  Pulse 58  Temp(Src) 97.7 F (36.5 C) (Oral)  Resp 20  SpO2 100%  LMP 11/10/2011  Physical Exam  Nursing note and vitals reviewed.  46 year old female who is hyperventilating. She appears uncomfortable. Vital signs are significant for mild bradycardia with heart rate of 58. Recorded respiratory rate of 20 seems lower than what she is actually doing that at the time of my exam where she seems to be about 30 times a minute. Oxygen saturation is 100% which is normal. Head is normocephalic and atraumatic. PERRLA, EOMI. Oropharynx is clear. Neck is supple without adenopathy or JVD or tenderness. Back is nontender. Lungs are clear without rales, wheezes, or rhonchi. Heart has regular rate and rhythm without murmur. There is moderate anterior chest wall tenderness which does reproduce her pain. Abdomen is soft, flat, nontender without masses or hepatosplenomegaly. Extremities have no cyanosis or edema, full range of motion is present. Skin is warm and moist without rash. Neurologic: Mental status is significant for anxiety. Cranial nerves are intact, there  no focal motor or sensory deficits.  ED Course  Procedures (including critical care time)  Results for orders placed during the hospital encounter of 11/24/11  CBC      Component Value Range   WBC 11.5 (*) 4.0 - 10.5 (K/uL)   RBC 4.35  3.87 - 5.11 (MIL/uL)   Hemoglobin 13.6  12.0 - 15.0 (g/dL)   HCT 16.1  09.6 - 04.5 (%)   MCV 92.2  78.0 - 100.0 (fL)   MCH 31.3  26.0 - 34.0 (pg)   MCHC 33.9  30.0 - 36.0 (g/dL)   RDW 40.9  81.1 - 91.4 (%)   Platelets 351  150 - 400 (K/uL)  DIFFERENTIAL      Component Value Range   Neutrophils Relative 44  43 - 77 (%)   Neutro Abs 5.0  1.7 - 7.7 (K/uL)   Lymphocytes Relative 47 (*) 12 - 46 (%)   Lymphs Abs 5.4 (*) 0.7 - 4.0 (K/uL)   Monocytes Relative 8  3 - 12 (%)   Monocytes Absolute 0.9  0.1 - 1.0 (K/uL)   Eosinophils Relative 1  0 - 5 (%)   Eosinophils Absolute 0.1  0.0 - 0.7 (K/uL)   Basophils Relative 0  0 - 1 (%)   Basophils Absolute 0.0  0.0 - 0.1 (K/uL)  BASIC METABOLIC PANEL      Component Value Range   Sodium 135  135 - 145 (mEq/L)   Potassium 2.8 (*) 3.5 - 5.1 (mEq/L)   Chloride 95 (*) 96 - 112 (mEq/L)   CO2 29  19 - 32 (mEq/L)   Glucose, Bld 95  70 - 99 (mg/dL)   BUN 4 (*) 6 - 23 (mg/dL)   Creatinine, Ser 7.82  0.50 - 1.10 (mg/dL)   Calcium 9.5  8.4 - 95.6 (mg/dL)   GFR calc non Af Amer >90  >90 (mL/min)   GFR calc Af Amer >90  >90 (mL/min)  TROPONIN I      Component Value Range   Troponin I <0.30  <0.30 (ng/mL)  D-DIMER, QUANTITATIVE      Component Value Range   D-Dimer, Quant 0.22  0.00 - 0.48 (ug/mL-FEU)   Dg Chest 2 View  11/24/2011  *RADIOLOGY REPORT*  Clinical Data: Substernal chest pain, dizziness.  CHEST - 2 VIEW  Comparison: 10/31/2010  Findings: Heart and mediastinal contours are within normal limits. No focal opacities or effusions.  No acute bony abnormality.  IMPRESSION: No active cardiopulmonary disease.  Original Report Authenticated By: Cyndie Chime, M.D.  Dg Lumbar Spine Complete  10/28/2011   *RADIOLOGY REPORT*  Clinical Data: Low back pain radiating down both legs  LUMBAR SPINE - COMPLETE 4+ VIEW  Comparison: None  Findings: Five non-rib bearing lumbar vertebrae. Spina bifida occulta at T12 and L1. SI joints symmetric. Vertebral body and disc space heights maintained. No acute fracture, subluxation, or bone destruction. No spondylolysis. Surgical clip projects over the left mid abdomen.  IMPRESSION: Spina bifida occulta of T12 and L1. No acute lumbar spine abnormalities.  Original Report Authenticated By: Lollie Marrow, M.D.      1. Chest pain   2. Hypokalemia      Date: 11/24/2011  Rate: 69  Rhythm: normal sinus rhythm  QRS Axis: left  Intervals: normal  ST/T Wave abnormalities: normal and nonspecific T wave changes  Conduction Disutrbances:left anterior fascicular block  Narrative Interpretation: Left anterior fascicular block with nonspecific T wave flattening in the anterolateral leads. When compared with ECG of Mar 25 2010,  Old EKG Reviewed: changes noted  Is only partial relief of her pain with ketorolac. She is noted to be hypokalemic and is given oral and IV potassium. Case is discussed with Dr. Adela Glimpse and arrangements are made to admit the patient for further cardiac evaluation.  MDM  Chest pain which is most likely not cardiac. She clearly has some component of chest wall pain, and will be given a dose of ketorolac. Catheterization was done in [place], and will not be able to get that report tonight. Cardiac markers will be checked.        Dione Booze, MD 11/25/11 [phone]

## 2011-11-24 NOTE — ED Notes (Signed)
EKG done and given to Dr Glick 

## 2011-11-25 ENCOUNTER — Encounter (HOSPITAL_COMMUNITY): Payer: Self-pay | Admitting: Internal Medicine

## 2011-11-25 ENCOUNTER — Other Ambulatory Visit: Payer: Self-pay

## 2011-11-25 DIAGNOSIS — R079 Chest pain, unspecified: Secondary | ICD-10-CM | POA: Diagnosis present

## 2011-11-25 DIAGNOSIS — E876 Hypokalemia: Secondary | ICD-10-CM | POA: Diagnosis present

## 2011-11-25 LAB — BASIC METABOLIC PANEL
BUN: 4 mg/dL — ABNORMAL LOW (ref 6–23)
CO2: 29 mEq/L (ref 19–32)
Calcium: 9.5 mg/dL (ref 8.4–10.5)
Chloride: 95 mEq/L — ABNORMAL LOW (ref 96–112)
Creatinine, Ser: 0.66 mg/dL (ref 0.50–1.10)
GFR calc Af Amer: >90 mL/min (ref >90)

## 2011-11-25 LAB — CARDIAC PANEL(CRET KIN+CKTOT+MB+TROPI)
Relative Index: INVALID (ref 0.0–2.5)
Relative Index: INVALID (ref 0.0–2.5)
Total CK: 48 U/L (ref 7–177)
Total CK: 48 U/L (ref 7–177)
Troponin I: <0.3 ng/mL (ref <0.30)

## 2011-11-25 LAB — HEPATIC FUNCTION PANEL
AST: 12 U/L (ref 0–37)
Albumin: 3.4 g/dL — ABNORMAL LOW (ref 3.5–5.2)
Alkaline Phosphatase: 62 U/L (ref 39–117)
Bilirubin, Direct: <0.1 mg/dL (ref 0.0–0.3)
Total Bilirubin: 0.4 mg/dL (ref 0.3–1.2)

## 2011-11-25 LAB — COMPREHENSIVE METABOLIC PANEL
AST: 11 U/L (ref 0–37)
Albumin: 2.9 g/dL — ABNORMAL LOW (ref 3.5–5.2)
Calcium: 8.4 mg/dL (ref 8.4–10.5)
Creatinine, Ser: 0.5 mg/dL (ref 0.50–1.10)
Sodium: 135 mEq/L (ref 135–145)

## 2011-11-25 LAB — CBC
HCT: 37.3 % (ref 36.0–46.0)
Hemoglobin: 12.4 g/dL (ref 12.0–15.0)
MCH: 31.2 pg (ref 26.0–34.0)
MCV: 93.7 fL (ref 78.0–100.0)
RBC: 3.98 MIL/uL (ref 3.87–5.11)

## 2011-11-25 LAB — HEMOGLOBIN A1C: Hgb A1c MFr Bld: 5.9 % — ABNORMAL HIGH (ref <5.7)

## 2011-11-25 LAB — TSH: TSH: 1.05 u[IU]/mL (ref 0.350–4.500)

## 2011-11-25 LAB — RHEUMATOID FACTOR: Rhuematoid fact SerPl-aCnc: <10 IU/mL (ref <=14)

## 2011-11-25 LAB — C-REACTIVE PROTEIN: CRP: 1.13 mg/dL — ABNORMAL HIGH (ref <0.60)

## 2011-11-25 MED ORDER — VENLAFAXINE HCL ER 150 MG PO CP24
150.0000 mg | ORAL_CAPSULE | Freq: Every day | ORAL | Status: DC
  Administered 2011-11-25 – 2011-11-28 (×4): 150 mg via ORAL
  Filled 2011-11-25 (×6): qty 1

## 2011-11-25 MED ORDER — SODIUM CHLORIDE 0.9 % IV SOLN
INTRAVENOUS | Status: DC
  Administered 2011-11-25: 50 mL/h via INTRAVENOUS

## 2011-11-25 MED ORDER — POTASSIUM CHLORIDE 10 MEQ/100ML IV SOLN
10.0000 meq | Freq: Once | INTRAVENOUS | Status: AC
  Administered 2011-11-25: 10 meq via INTRAVENOUS
  Filled 2011-11-25: qty 100

## 2011-11-25 MED ORDER — ONDANSETRON HCL 4 MG/2ML IJ SOLN: 4.0000 mg | Freq: Four times a day (QID) | INTRAMUSCULAR | Status: DC | PRN

## 2011-11-25 MED ORDER — NORTRIPTYLINE HCL 25 MG PO CAPS
25.0000 mg | ORAL_CAPSULE | Freq: Every day | ORAL | Status: DC
  Administered 2011-11-25 – 2011-11-27 (×3): 25 mg via ORAL
  Filled 2011-11-25 (×4): qty 1

## 2011-11-25 MED ORDER — POTASSIUM CHLORIDE CRYS ER 20 MEQ PO TBCR
40.0000 meq | EXTENDED_RELEASE_TABLET | Freq: Once | ORAL | Status: AC
  Administered 2011-11-25: 40 meq via ORAL
  Filled 2011-11-25: qty 2
  Filled 2011-11-25: qty 1

## 2011-11-25 MED ORDER — POTASSIUM CHLORIDE CRYS ER 20 MEQ PO TBCR
40.0000 meq | EXTENDED_RELEASE_TABLET | Freq: Once | ORAL | Status: AC
  Administered 2011-11-25: 40 meq via ORAL
  Filled 2011-11-25: qty 2

## 2011-11-25 MED ORDER — LAMOTRIGINE 200 MG PO TABS
200.0000 mg | ORAL_TABLET | ORAL | Status: DC
  Administered 2011-11-25 – 2011-11-28 (×4): 200 mg via ORAL
  Filled 2011-11-25 (×5): qty 1

## 2011-11-25 MED ORDER — MORPHINE SULFATE 4 MG/ML IJ SOLN
4.0000 mg | Freq: Once | INTRAMUSCULAR | Status: AC
  Administered 2011-11-25: 4 mg via INTRAVENOUS
  Filled 2011-11-25: qty 1

## 2011-11-25 MED ORDER — ACETAMINOPHEN 650 MG RE SUPP: 650.0000 mg | Freq: Four times a day (QID) | RECTAL | Status: DC | PRN

## 2011-11-25 MED ORDER — ALBUTEROL SULFATE (5 MG/ML) 0.5% IN NEBU: 2.5000 mg | INHALATION_SOLUTION | RESPIRATORY_TRACT | Status: DC | PRN

## 2011-11-25 MED ORDER — SODIUM CHLORIDE 0.9 % IV SOLN
INTRAVENOUS | Status: DC
  Administered 2011-11-25: 06:00:00 via INTRAVENOUS

## 2011-11-25 MED ORDER — POTASSIUM CHLORIDE 10 MEQ/100ML IV SOLN
10.0000 meq | INTRAVENOUS | Status: DC
  Administered 2011-11-25 (×2): 10 meq via INTRAVENOUS
  Filled 2011-11-25 (×4): qty 100

## 2011-11-25 MED ORDER — ONDANSETRON HCL 4 MG PO TABS
4.0000 mg | ORAL_TABLET | Freq: Four times a day (QID) | ORAL | Status: DC | PRN
  Filled 2011-11-25: qty 1

## 2011-11-25 MED ORDER — ONDANSETRON HCL 4 MG PO TABS
4.0000 mg | ORAL_TABLET | Freq: Four times a day (QID) | ORAL | Status: DC
  Administered 2011-11-25 – 2011-11-28 (×11): 4 mg via ORAL
  Filled 2011-11-25 (×15): qty 1
  Filled 2011-11-25: qty 31
  Filled 2011-11-25 (×3): qty 1

## 2011-11-25 MED ORDER — METOPROLOL SUCCINATE ER 25 MG PO TB24
25.0000 mg | ORAL_TABLET | Freq: Every day | ORAL | Status: DC
  Administered 2011-11-25 – 2011-11-26 (×2): 25 mg via ORAL
  Filled 2011-11-25 (×4): qty 1

## 2011-11-25 MED ORDER — ALUM & MAG HYDROXIDE-SIMETH 200-200-20 MG/5ML PO SUSP: 30.0000 mL | Freq: Four times a day (QID) | ORAL | Status: DC | PRN

## 2011-11-25 MED ORDER — ENOXAPARIN SODIUM 40 MG/0.4ML ~~LOC~~ SOLN
40.0000 mg | SUBCUTANEOUS | Status: DC
  Administered 2011-11-25 – 2011-11-28 (×4): 40 mg via SUBCUTANEOUS
  Filled 2011-11-25 (×4): qty 0.4

## 2011-11-25 MED ORDER — MORPHINE SULFATE 2 MG/ML IJ SOLN: 2.0000 mg | INTRAMUSCULAR | Status: DC | PRN

## 2011-11-25 MED ORDER — GUAIFENESIN-DM 100-10 MG/5ML PO SYRP: 5.0000 mL | ORAL_SOLUTION | ORAL | Status: DC | PRN

## 2011-11-25 MED ORDER — OXYCODONE-ACETAMINOPHEN 5-325 MG PO TABS
1.0000 | ORAL_TABLET | ORAL | Status: DC | PRN
  Administered 2011-11-25 – 2011-11-28 (×9): 1 via ORAL
  Filled 2011-11-25 (×9): qty 1

## 2011-11-25 MED ORDER — PANTOPRAZOLE SODIUM 40 MG PO TBEC: 40.0000 mg | DELAYED_RELEASE_TABLET | Freq: Every day | ORAL | Status: DC

## 2011-11-25 MED ORDER — ACETAMINOPHEN 325 MG PO TABS
650.0000 mg | ORAL_TABLET | Freq: Four times a day (QID) | ORAL | Status: DC | PRN
  Filled 2011-11-25: qty 2

## 2011-11-25 MED ORDER — PANTOPRAZOLE SODIUM 40 MG PO TBEC
40.0000 mg | DELAYED_RELEASE_TABLET | Freq: Two times a day (BID) | ORAL | Status: DC
  Administered 2011-11-25 – 2011-11-28 (×6): 40 mg via ORAL
  Filled 2011-11-25 (×6): qty 1

## 2011-11-25 NOTE — Consult Note (Signed)
[facility]       CONSULTATION NOTE   Reason for Consult: Dyspnea, chest wall pain  Requesting Physician: Dr. Brien Few  HPI: This is a 46 y.o. female with a past medical history significant for bipolar disorder, panic attacks, hypertension, osteoarthritis, obesity, sciatica, Raynaud's phenomenon, and ongoing dyspnea for many years. She has a history of chest pain and had 2 prior stress tests in the early 2000's. He subsequently moved to [place] and had a cardiac catheterization variant 2006 which was negative for obstructive disease. Yesterday she had an onset of chest pain which was located across the chest, radiating to the right and left sides of her chest. The pain was somewhat worsened with change in position and improved with sitting up. In addition they chest wall is reported as "sore". She also reports significant shortness of breath with minimal exertion.  It should be noted that she's had a recent 50 pound weight loss and is currently taking phentermine, as a weight loss medication. This medication has been indicated in heart valvular disorders.  With regards to her Raynaud's phenomenon, she reports that she has a cousin with lupus and that there may be autoimmune diseases in the family, however she was never told or diagnosed with an autoimmune disorder. She denies any history of migraine headache, suggesting arteriospasm. During my interview with the patient she was having chest pain without any active EKG changes, although she does have anterolateral T-wave inversions on her baseline EKGs at admission. She does not currently have medicaid.  PMHx:  Past Medical History  Diagnosis Date  . OA (osteoarthritis)   . HTN (hypertension)   . Obesity   . Bipolar 1 disorder   . Depression   . Depression   . Back pain   . Anxiety   . Sciatica    Past Surgical History  Procedure Date  . Cholecystectomy 2007  . Cardiac catheterization 2006    normal  .  Cesarean section 2002  . Right knee sur 2005  . Tonsillectomy     FAMHx: Family History  Problem Relation Age of Onset  . Heart disease Mother   . Diabetes Father     SOCHx:  reports that she has never smoked. She has never used smokeless tobacco. She reports that she drinks alcohol. She reports that she does not use illicit drugs.  ALLERGIES: Allergies  Allergen Reactions  . Vicodin (Hydrocodone-Acetaminophen)     Hives & swelling    ROS: A comprehensive review of systems was negative except for: Constitutional: positive for malaise Eyes: positive for dilated pupils Respiratory: positive for dyspnea on exertion and pleurisy/chest pain Cardiovascular: positive for chest pain, dyspnea and fatigue Musculoskeletal: positive for back pain, myalgias and stiff joints Neurological: positive for weakness Behavioral/Psych: positive for bipolar, mood swings and panic attacks Allergic/Immunologic: positive for Raynaud's phenomenon  HOME MEDICATIONS: Prescriptions prior to admission  Medication Sig Dispense Refill  . Ascorbic Acid (VITAMIN C PO) Take 2 tablets by mouth daily.        . Calcium Carbonate-Vitamin D (CALCIUM 600+D) 600-400 MG-UNIT per tablet Take 1 tablet by mouth 2 (two) times daily with meals.        Marland Kitchen desvenlafaxine (PRISTIQ) 100 MG 24 hr tablet Take 100 mg by mouth daily.        . hydrochlorothiazide 25 MG tablet take 1 tablet by mouth once daily for blood pressure  90 tablet  1  . lamoTRIgine (LAMICTAL) 200  MG tablet Take 200 mg by mouth every morning.        . metoprolol succinate (TOPROL-XL) 25 MG 24 hr tablet take 1 tablet by mouth once daily for blood pressure  31 tablet  3  . nortriptyline (PAMELOR) 25 MG capsule take 1 capsule by mouth every evening  31 capsule  2  . omeprazole (PRILOSEC) 20 MG capsule Take 20 mg by mouth daily.        Marland Kitchen oxyCODONE-acetaminophen (PERCOCET) 5-325 MG per tablet Take 1 tablet by mouth every 4 (four) hours as needed. For pain         . phentermine 37.5 MG capsule Take 37.5 mg by mouth every morning.        Marland Kitchen POTASSIUM PO Take 1 tablet by mouth daily. Over the counter SAMS potassium.      Marland Kitchen ondansetron (ZOFRAN) 4 MG tablet Take 1 tablet (4 mg total) by mouth every 6 (six) hours.  12 tablet  0    HOSPITAL MEDICATIONS: Prior to Admission:  Prescriptions prior to admission  Medication Sig Dispense Refill  . Ascorbic Acid (VITAMIN C PO) Take 2 tablets by mouth daily.        . Calcium Carbonate-Vitamin D (CALCIUM 600+D) 600-400 MG-UNIT per tablet Take 1 tablet by mouth 2 (two) times daily with meals.        Marland Kitchen desvenlafaxine (PRISTIQ) 100 MG 24 hr tablet Take 100 mg by mouth daily.        . hydrochlorothiazide 25 MG tablet take 1 tablet by mouth once daily for blood pressure  90 tablet  1  . lamoTRIgine (LAMICTAL) 200 MG tablet Take 200 mg by mouth every morning.        . metoprolol succinate (TOPROL-XL) 25 MG 24 hr tablet take 1 tablet by mouth once daily for blood pressure  31 tablet  3  . nortriptyline (PAMELOR) 25 MG capsule take 1 capsule by mouth every evening  31 capsule  2  . omeprazole (PRILOSEC) 20 MG capsule Take 20 mg by mouth daily.        Marland Kitchen oxyCODONE-acetaminophen (PERCOCET) 5-325 MG per tablet Take 1 tablet by mouth every 4 (four) hours as needed. For pain       . phentermine 37.5 MG capsule Take 37.5 mg by mouth every morning.        Marland Kitchen POTASSIUM PO Take 1 tablet by mouth daily. Over the counter SAMS potassium.      Marland Kitchen ondansetron (ZOFRAN) 4 MG tablet Take 1 tablet (4 mg total) by mouth every 6 (six) hours.  12 tablet  0    VITALS: Blood pressure 104/69, pulse 58, temperature 97.4 F (36.3 C), temperature source Oral, resp. rate 22, height 5\' 5"  (1.651 m), weight 115.5 kg (254 lb 10.1 oz), last menstrual period 11/10/2011, SpO2 100.00%.  PHYSICAL EXAM: General appearance: alert and mild distress Neck: no adenopathy, no carotid bruit, no JVD, supple, symmetrical, trachea midline and thyroid not enlarged,  symmetric, no tenderness/mass/nodules Lungs: clear to auscultation bilaterally Heart: regular rate and rhythm, S1, S2 normal, no murmur, click, rub or gallop Abdomen: obese, soft-non-tender, +BS, no masses Extremities: extremities normal, atraumatic, no cyanosis or edema Pulses: 2+ and symmetric Skin: Skin color, texture, turgor normal. No rashes or lesions Neurologic: Grossly normal  LABS: Results for orders placed during the hospital encounter of 11/24/11 (from the past 48 hour(s))  CBC     Status: Abnormal   Collection Time   11/24/11 11:00 PM  Component Value Range Comment   WBC 11.5 (*) 4.0 - 10.5 (K/uL)    RBC 4.35  3.87 - 5.11 (MIL/uL)    Hemoglobin 13.6  12.0 - 15.0 (g/dL)    HCT 16.1  09.6 - 04.5 (%)    MCV 92.2  78.0 - 100.0 (fL)    MCH 31.3  26.0 - 34.0 (pg)    MCHC 33.9  30.0 - 36.0 (g/dL)    RDW 40.9  81.1 - 91.4 (%)    Platelets 351  150 - 400 (K/uL)   DIFFERENTIAL     Status: Abnormal   Collection Time   11/24/11 11:00 PM      Component Value Range Comment   Neutrophils Relative 44  43 - 77 (%)    Neutro Abs 5.0  1.7 - 7.7 (K/uL)    Lymphocytes Relative 47 (*) 12 - 46 (%)    Lymphs Abs 5.4 (*) 0.7 - 4.0 (K/uL)    Monocytes Relative 8  3 - 12 (%)    Monocytes Absolute 0.9  0.1 - 1.0 (K/uL)    Eosinophils Relative 1  0 - 5 (%)    Eosinophils Absolute 0.1  0.0 - 0.7 (K/uL)    Basophils Relative 0  0 - 1 (%)    Basophils Absolute 0.0  0.0 - 0.1 (K/uL)   BASIC METABOLIC PANEL     Status: Abnormal   Collection Time   11/24/11 11:00 PM      Component Value Range Comment   Sodium 135  135 - 145 (mEq/L)    Potassium 2.8 (*) 3.5 - 5.1 (mEq/L)    Chloride 95 (*) 96 - 112 (mEq/L)    CO2 29  19 - 32 (mEq/L)    Glucose, Bld 95  70 - 99 (mg/dL)    BUN 4 (*) 6 - 23 (mg/dL)    Creatinine, Ser 7.82  0.50 - 1.10 (mg/dL)    Calcium 9.5  8.4 - 10.5 (mg/dL)    GFR calc non Af Amer >90  >90 (mL/min)    GFR calc Af Amer >90  >90 (mL/min)   TROPONIN I     Status: Normal    Collection Time   11/24/11 11:00 PM      Component Value Range Comment   Troponin I <0.30  <0.30 (ng/mL)   D-DIMER, QUANTITATIVE     Status: Normal   Collection Time   11/24/11 11:00 PM      Component Value Range Comment   D-Dimer, Quant 0.22  0.00 - 0.48 (ug/mL-FEU)   HEPATIC FUNCTION PANEL     Status: Abnormal   Collection Time   11/24/11 11:00 PM      Component Value Range Comment   Total Protein 7.5  6.0 - 8.3 (g/dL)    Albumin 3.4 (*) 3.5 - 5.2 (g/dL)    AST 12  0 - 37 (U/L)    ALT 7  0 - 35 (U/L)    Alkaline Phosphatase 62  39 - 117 (U/L)    Total Bilirubin 0.4  0.3 - 1.2 (mg/dL)    Bilirubin, Direct <9.5  0.0 - 0.3 (mg/dL)    Indirect Bilirubin NOT CALCULATED  0.3 - 0.9 (mg/dL)   CARDIAC PANEL(CRET KIN+CKTOT+MB+TROPI)     Status: Normal   Collection Time   11/25/11  5:28 AM      Component Value Range Comment   Total CK 48  7 - 177 (U/L)    CK, MB 1.1  0.3 -  4.0 (ng/mL)    Troponin I <0.30  <0.30 (ng/mL)    Relative Index RELATIVE INDEX IS INVALID  0.0 - 2.5    TSH     Status: Normal   Collection Time   11/25/11  6:30 AM      Component Value Range Comment   TSH 1.050  0.350 - 4.500 (uIU/mL)   COMPREHENSIVE METABOLIC PANEL     Status: Abnormal   Collection Time   11/25/11  6:30 AM      Component Value Range Comment   Sodium 135  135 - 145 (mEq/L)    Potassium 3.6  3.5 - 5.1 (mEq/L)    Chloride 98  96 - 112 (mEq/L)    CO2 27  19 - 32 (mEq/L)    Glucose, Bld 81  70 - 99 (mg/dL)    BUN 6  6 - 23 (mg/dL)    Creatinine, Ser 7.84  0.50 - 1.10 (mg/dL)    Calcium 8.4  8.4 - 10.5 (mg/dL)    Total Protein 6.5  6.0 - 8.3 (g/dL)    Albumin 2.9 (*) 3.5 - 5.2 (g/dL)    AST 11  0 - 37 (U/L)    ALT 7  0 - 35 (U/L)    Alkaline Phosphatase 54  39 - 117 (U/L)    Total Bilirubin 0.5  0.3 - 1.2 (mg/dL)    GFR calc non Af Amer >90  >90 (mL/min)    GFR calc Af Amer >90  >90 (mL/min)   CBC     Status: Normal   Collection Time   11/25/11  6:30 AM      Component Value Range Comment    WBC 9.5  4.0 - 10.5 (K/uL)    RBC 3.98  3.87 - 5.11 (MIL/uL)    Hemoglobin 12.4  12.0 - 15.0 (g/dL)    HCT 69.6  29.5 - 28.4 (%)    MCV 93.7  78.0 - 100.0 (fL)    MCH 31.2  26.0 - 34.0 (pg)    MCHC 33.2  30.0 - 36.0 (g/dL)    RDW 13.2  44.0 - 10.2 (%)    Platelets 300  150 - 400 (K/uL)     IMAGING: Dg Chest 2 View  11/24/2011  *RADIOLOGY REPORT*  Clinical Data: Substernal chest pain, dizziness.  CHEST - 2 VIEW  Comparison: 10/31/2010  Findings: Heart and mediastinal contours are within normal limits. No focal opacities or effusions.  No acute bony abnormality.  IMPRESSION: No active cardiopulmonary disease.  Original Report Authenticated By: Cyndie Chime, M.D.   EKG: Normal sinus rhythm with anterolateral T wave inversions  IMPRESSION: 1. Atypical chest pain, mostly chest wall pain which is reproducible. This could represent fibromyalgia or perhaps pericarditis due to its position now the and change with respiration. Anterior T-wave inversions are concerning but do not necessarily represent ischemia. 2. Dyspnea- this is really her chief complaint, although she apparently has had problems with this for years.  This could be pulmonary or cardiac (pericarditis, pulmonary HTN, valvular heart disease - no murmur).  ?autoimmune etiology with underlying Raynaud's.  3. Anxiety/bipolar - there seems to be a significant component of this, however, her symptoms are different than her reported anxiety.  RECOMMENDATION: 1. She does have risk factors for ischemia and T-wave inversions on EKG (unclear if they are new) along with chest pain, however, it is reproducible, present constantly at rest and seems to be in the chest wall.  The symptoms don't seem  to be ischemic to me and there is no evidence of infarct on labs.  I don't feel a stress test is indicated at this time. 2. With regards to her dyspnea, it would be reasonable to get a 2D echo, especially since she is on phentermine for weight loss, which  may be associated with valvular heart disease.  I would also suggest a basic auto-immune work-up given her history of Raynaud's, to evaluate for lupus, scleroderma, or rheumatoid arthritis which could be implicated in her dyspnea.  Will review her echo and follow with you.  Thanks for the consult.  Time Spent Directly with Patient: 30 minutes  Chrystie Nose, MD Attending Cardiologist [facility]  HILTY,Kenneth C 11/25/2011, 12:07 PM

## 2011-11-25 NOTE — H&P (Signed)
PCP:  [facility]  Chief Complaint:  Chest Pain  HPI: Katie [name] is a 46 y.o. female   has a past medical history of OA (osteoarthritis); HTN (hypertension); Obesity; Bipolar 1 disorder; Depression; Depression; Back pain; Anxiety; and Sciatica.   Presented with  Chest pain started at 8 pm Chest pain felt like someone was pushing on her chest with radiation to the back. No nausea or vomiting.  Even earlier she felt tired like she was starting to have flu and took a nap. She got went out shopping but felt lightheaded. ONce the chest pain started she called her PCP office and was told to come into ER. IN ED her cardiac enzymes and D.dimer has been negative. Given that her pain has persisted hospitalist was called for an admition. Her pain is reproducible by pressing on her chest.  No fever. Poor appetitie today. She have had some shortness of breath associated with this.  NO leg swelling no history of travel.  No sick contacts.   Review of Systems:    Pertinent positives include: chest pain,chills, dizziness,shortness of breath at rest.  Constitutional:  No weight loss, night sweats, Fevers,  fatigue.  HEENT:  No headaches, Difficulty swallowing,Tooth/dental problems,Sore throat,  No sneezing, itching, ear ache, nasal congestion, post nasal drip,  Cardio-vascular:  No  Orthopnea, PND, anasarca, palpitations.no Bilateral lower extremity swelling  GI:  No heartburn, indigestion, abdominal pain, nausea, vomiting, diarrhea, change in bowel habits, loss of appetite, melena, blood in stool, hematoemesis Resp:  no  No dyspnea on exertion, No excess mucus, no productive cough, No non-productive cough, No coughing up of blood.No change in color of mucus.No wheezing.No chest wall deformity  Skin:  no rash or lesions.  GU:  no dysuria, change in color of urine, no urgency or frequency. No flank pain.  Musculoskeletal:  No joint pain or swelling. No decreased  range of motion. No back pain.  Psych:  No change in mood or affect. No depression or anxiety. No memory loss.  Neuro: no localizing neurological complaints, no tingling, no weakness, no double vision, no gait abnormality, no slurred speech   Otherwise ROS are negative except for above, 10 systems were reviewed  Past Medical History: Past Medical History  Diagnosis Date  . OA (osteoarthritis)   . HTN (hypertension)   . Obesity   . Bipolar 1 disorder   . Depression   . Depression   . Back pain   . Anxiety   . Sciatica    Past Surgical History  Procedure Date  . Cholecystectomy 2007  . Cardiac catheterization 2006    normal  . Cesarean section 2002  . Right knee sur 2005  . Tonsillectomy      Medications: Prior to Admission medications   Medication Sig Start Date End Date Taking? Authorizing Provider  Ascorbic Acid (VITAMIN C PO) Take 2 tablets by mouth daily.     Yes Historical Provider, MD  Calcium Carbonate-Vitamin D (CALCIUM 600+D) 600-400 MG-UNIT per tablet Take 1 tablet by mouth 2 (two) times daily with meals.     Yes Historical Provider, MD  desvenlafaxine (PRISTIQ) 100 MG 24 hr tablet Take 100 mg by mouth daily.     Yes Historical Provider, MD  hydrochlorothiazide 25 MG tablet take 1 tablet by mouth once daily for blood pressure 05/09/11  Yes Antoine Primas, DO  lamoTRIgine (LAMICTAL) 200 MG tablet Take 200 mg by mouth every morning.     Yes  Historical Provider, MD  metoprolol succinate (TOPROL-XL) 25 MG 24 hr tablet take 1 tablet by mouth once daily for blood pressure 05/09/11  Yes Antoine Primas, DO  nortriptyline (PAMELOR) 25 MG capsule take 1 capsule by mouth every evening 07/13/11  Yes Antoine Primas, DO  omeprazole (PRILOSEC) 20 MG capsule Take 20 mg by mouth daily.     Yes Historical Provider, MD  oxyCODONE-acetaminophen (PERCOCET) 5-325 MG per tablet Take 1 tablet by mouth every 4 (four) hours as needed. For pain    Yes Historical Provider, MD  phentermine 37.5 MG  capsule Take 37.5 mg by mouth every morning.     Yes Historical Provider, MD  POTASSIUM PO Take 1 tablet by mouth daily. Over the counter SAMS potassium.   Yes Historical Provider, MD  ondansetron (ZOFRAN) 4 MG tablet Take 1 tablet (4 mg total) by mouth every 6 (six) hours. 09/26/11 10/03/11  Dorthula Matas, PA    Allergies:   Allergies  Allergen Reactions  . Vicodin (Hydrocodone-Acetaminophen)     Hives & swelling    Social History:  Ambulatory independently Lives at home with children   reports that she has never smoked. She has never used smokeless tobacco. She reports that she drinks alcohol. She reports that she does not use illicit drugs.   Family History: family history includes Diabetes in her father and Heart disease in her mother.    Physical Exam: Patient Vitals for the past 24 hrs:  BP Temp Temp src Pulse Resp SpO2  11/25/11 0245 100/61 mmHg - - 67  20  100 %  11/25/11 0230 103/61 mmHg - - 60  21  100 %  11/25/11 0215 105/63 mmHg - - 59  20  100 %  11/25/11 0210 103/61 mmHg - - 61  16  100 %  11/25/11 0200 103/61 mmHg - - 60  24  100 %  11/25/11 0145 110/65 mmHg - - 57  24  100 %  11/25/11 0130 107/83 mmHg - - 61  17  100 %  11/25/11 0115 112/74 mmHg - - 65  22  100 %  11/25/11 0100 120/81 mmHg - - 59  22  100 %  11/25/11 0045 116/79 mmHg - - 64  15  100 %  11/25/11 0030 110/73 mmHg - - 62  22  100 %  11/25/11 0025 111/73 mmHg - - 61  - 100 %  11/25/11 0015 111/73 mmHg - - 61  22  100 %  11/25/11 0000 116/71 mmHg - - 59  22  100 %  11/24/11 2345 104/72 mmHg - - 58  16  100 %  11/24/11 2333 150/68 mmHg - - 62  - 100 %  11/24/11 2300 101/68 mmHg - - 60  19  100 %  11/24/11 2245 101/66 mmHg - - 57  14  100 %  11/24/11 2239 111/65 mmHg 97.7 F (36.5 C) Oral 58  20  100 %  11/24/11 2226 - - - - - 98 %    1. General:  in No Acute distress 2. Psychological: Alert and Oriented 3. Head/ENT:   Moist Mucous Membranes                          Head Non traumatic,  neck supple                          Normal  Dentition 4.  SKIN: normal  Skin turgor,  Skin clean Dry and intact no rash 5. Heart: Regular rate and rhythm no Murmur, Rub or gallop 6. Lungs: Clear to auscultation bilaterally, no wheezes or crackles   7. Abdomen: Soft, non-tender, Non distended, obese 8. Lower extremities: no clubbing, cyanosis, or edema 9. Neurologically Grossly intact, moving all 4 extremities equally 10. MSK: Normal range of motion  body mass index is unknown because there is no height or weight on file.   Labs on Admission:   Basename 11/24/11 2300  NA 135  K 2.8*  CL 95*  CO2 29  GLUCOSE 95  BUN 4*  CREATININE 0.66  CALCIUM 9.5  MG --  PHOS --   No results found for this basename: AST:2,ALT:2,ALKPHOS:2,BILITOT:2,PROT:2,ALBUMIN:2 in the last 72 hours No results found for this basename: LIPASE:2,AMYLASE:2 in the last 72 hours  Basename 11/24/11 2300  WBC 11.5*  NEUTROABS 5.0  HGB 13.6  HCT 40.1  MCV 92.2  PLT 351    Basename 11/24/11 2300  CKTOTAL --  CKMB --  CKMBINDEX --  TROPONINI <0.30   No results found for this basename: TSH,T4TOTAL,FREET3,T3FREE,THYROIDAB in the last 72 hours No results found for this basename: VITAMINB12:2,FOLATE:2,FERRITIN:2,TIBC:2,IRON:2,RETICCTPCT:2 in the last 72 hours No results found for this basename: HGBA1C    The CrCl is unknown because both a height and weight (above a minimum accepted value) are required for this calculation. ABG    Component Value Date/Time   TCO2 30 07/23/2010 1400     Lab Results  Component Value Date   DDIMER 0.22 11/24/2011     Other results:  I have pearsonaly reviewed this: ECG REPORT  Rate:69  Rhythm: SR ST&T Change: T wave inversions in being V1-V4 somewhat more pronounced than her baseline   Cultures: No results found for this basename: sdes, specrequest, cult, reptstatus       Radiological Exams on Admission: Dg Chest 2 View  11/24/2011  *RADIOLOGY REPORT*   Clinical Data: Substernal chest pain, dizziness.  CHEST - 2 VIEW  Comparison: 10/31/2010  Findings: Heart and mediastinal contours are within normal limits. No focal opacities or effusions.  No acute bony abnormality.  IMPRESSION: No active cardiopulmonary disease.  Original Report Authenticated By: Cyndie Chime, M.D.    Assessment/Plan  46 year old female with somewhat atypical chest pain, hypoglycemia, leukocytosis and malaise early viral infection not excluded  Present on Admission:  .Chest pain - this is the typical reducible by palpation but given her risk factors will admit monitor on telemetry cycle cardiac markers and EKG in the morning her d-dimer is negative. Would hold off on Phentermine until patient CE have been completed then can restart.  .Hypokalemia - will replace and check magnesium level *Leukocytosis - will obtain a UA and urine culture it is possible that the patient has he illness will give supportive care at this point  Prophylaxis:  Lovenox, Protonix  CODE STATUS: Full code   Brooklin Rieger 11/25/2011, 3:05 AM

## 2011-11-25 NOTE — Progress Notes (Signed)
Subjective: Chest pain is much less, now about 3/10.  Objective: Vital signs in last 24 hours: Temp:  [97.4 F (36.3 C)-97.7 F (36.5 C)] 97.4 F (36.3 C) (01/19 0525) Pulse Rate:  [55-67] 55  (01/19 0525) Resp:  [14-24] 22  (01/19 0525) BP: (100-150)/(56-93) 120/81 mmHg (01/19 0525) SpO2:  [98 %-100 %] 100 % (01/19 0525) Weight:  [115.5 kg (254 lb 10.1 oz)] 115.5 kg (254 lb 10.1 oz) (01/19 0525) Weight change:  Last BM Date: 11/24/11  Intake/Output from previous day:       Physical Exam: General: Comfortable, alert, communicative, fully oriented, not short of breath at rest.  HEENT:  No clinical pallor, no jaundice, no conjunctival injection or discharge. Hydration is fair. NECK:  Supple, JVP not seen, no carotid bruits, no palpable lymphadenopathy, no palpable goiter. CHEST:  Clinically clear to auscultation, no wheezes, no crackles. Has localized chest wall soreness, to sternal pressure. HEART:  Sounds 1 and 2 heard, normal, regular, no murmurs. ABDOMEN:  Morbidly obese, soft, non-tender, no palpable organomegaly, no palpable masses, normal bowel sounds. GENITALIA:  Not examined. LOWER EXTREMITIES:  No pitting edema, palpable peripheral pulses. MUSCULOSKELETAL SYSTEM:  unremarkable. CENTRAL NERVOUS SYSTEM:  No focal neurologic deficit on gross examination.  Lab Results:  Basename 11/25/11 0630 11/24/11 2300  WBC 9.5 11.5*  HGB 12.4 13.6  HCT 37.3 40.1  PLT 300 351    Basename 11/24/11 2300  NA 135  K 2.8*  CL 95*  CO2 29  GLUCOSE 95  BUN 4*  CREATININE 0.66  CALCIUM 9.5   No results found for this or any previous visit (from the past 240 hour(s)).   Studies/Results: Dg Chest 2 View  11/24/2011  *RADIOLOGY REPORT*  Clinical Data: Substernal chest pain, dizziness.  CHEST - 2 VIEW  Comparison: 10/31/2010  Findings: Heart and mediastinal contours are within normal limits. No focal opacities or effusions.  No acute bony abnormality.  IMPRESSION: No active  cardiopulmonary disease.  Original Report Authenticated By: Cyndie Chime, M.D.    Medications: Scheduled Meds:   . enoxaparin  40 mg Subcutaneous Q24H  . ketorolac  30 mg Intravenous Once  . lamoTRIgine  200 mg Oral Q0700  . metoprolol succinate  25 mg Oral Daily  .  morphine injection  4 mg Intravenous Once  . nortriptyline  25 mg Oral QHS  . ondansetron  4 mg Oral Q6H  . pantoprazole  40 mg Oral Q1200  . potassium chloride  10 mEq Intravenous Once  . potassium chloride  10 mEq Intravenous Q1 Hr x 4  . potassium chloride  40 mEq Oral Once  . potassium chloride  40 mEq Oral Once  . venlafaxine  150 mg Oral Q breakfast   Continuous Infusions:   . sodium chloride 125 mL/hr at 11/25/11 0546  . DISCONTD: sodium chloride 50 mL/hr (11/25/11 0138)   PRN Meds:.acetaminophen, acetaminophen, albuterol, alum & mag hydroxide-simeth, guaiFENesin-dextromethorphan, morphine, ondansetron (ZOFRAN) IV, ondansetron, oxyCODONE-acetaminophen  Assessment/Plan:  Principal Problem:  *Chest pain: This is certainly, atypical. No arrhythmias recorded so far, cardiac enzymes are unelevated, and D-Dimer is normal. Besides, patient has localized chest wall tenderness. Given risk factors of obesity and HTN, will certainly merit stress testing, possibly on an outpatient basis. Will discuss with cardiologist. According to patient, she had a negative stress test 6 years ago. Meanwhile, will increase Protonix to b.i.d. Active Problems: 1.  Hypokalemia: Likely secondary to pre-admission Hydrochlorothiazide. Repleted accordingly. 2. HTN. Controlled. 3. Bipolar disorder: Stable, on  pre-admission psychotropics.  Comment: Will likely discharge later today.  LOS: 1 day   Debany Vantol,[name] 11/25/2011, 7:56 AM

## 2011-11-25 NOTE — ED Notes (Signed)
Pt. Alert and oriented, pt. Transported to floor via stretcher with tele tech

## 2011-11-26 LAB — URINE CULTURE

## 2011-11-26 LAB — URINALYSIS, ROUTINE W REFLEX MICROSCOPIC
Bilirubin Urine: NEGATIVE
Glucose, UA: NEGATIVE mg/dL
Hgb urine dipstick: NEGATIVE
Ketones, ur: NEGATIVE mg/dL
Protein, ur: NEGATIVE mg/dL
Urobilinogen, UA: 0.2 mg/dL (ref 0.0–1.0)

## 2011-11-26 LAB — PHOSPHORUS: Phosphorus: 4.2 mg/dL (ref 2.3–4.6)

## 2011-11-26 MED ORDER — IBUPROFEN 600 MG PO TABS
600.0000 mg | ORAL_TABLET | Freq: Three times a day (TID) | ORAL | Status: DC
  Administered 2011-11-26 – 2011-11-28 (×7): 600 mg via ORAL
  Filled 2011-11-26 (×10): qty 1

## 2011-11-26 MED ORDER — POLYETHYLENE GLYCOL 3350 17 G PO PACK
17.0000 g | PACK | Freq: Every day | ORAL | Status: DC
  Administered 2011-11-26 – 2011-11-28 (×3): 17 g via ORAL
  Filled 2011-11-26 (×3): qty 1

## 2011-11-26 NOTE — Progress Notes (Signed)
Pt. Seen and examined. Agree with the NP/PA-C note as written. Awaiting 2D echo and rheum work-up. Still with musculoskeletal chest pain improved with percocet. Consider trial of NSAIDs.  Chrystie Nose, MD Attending Cardiologist [facility]

## 2011-11-26 NOTE — Progress Notes (Signed)
Subjective: Comfortable. No SOB at rest.  Objective: Vital signs in last 24 hours: Temp:  [97.8 F (36.6 C)-98.2 F (36.8 C)] 98 F (36.7 C) (01/20 0500) Pulse Rate:  [49-57] 57  (01/20 0900) Resp:  [18-20] 18  (01/20 0500) BP: (94-106)/(56-66) 102/65 mmHg (01/20 0900) SpO2:  [98 %-100 %] 98 % (01/20 0500) Weight change:  Last BM Date: 11/24/11  Intake/Output from previous day:       Physical Exam: General: Comfortable, alert, communicative, fully oriented, not short of breath at rest.  HEENT:  No clinical pallor, no jaundice, no conjunctival injection or discharge. Hydration is fair. NECK:  Supple, JVP not seen, no carotid bruits, no palpable lymphadenopathy, no palpable goiter. CHEST:  Clinically clear to auscultation, no wheezes, no crackles. Has localized chest wall soreness, to sternal pressure. HEART:  Sounds 1 and 2 heard, normal, regular, no murmurs. ABDOMEN:  Morbidly obese, soft, non-tender, no palpable organomegaly, no palpable masses, normal bowel sounds. GENITALIA:  Not examined. LOWER EXTREMITIES:  No pitting edema, palpable peripheral pulses. MUSCULOSKELETAL SYSTEM:  unremarkable. CENTRAL NERVOUS SYSTEM:  No focal neurologic deficit on gross examination.  Lab Results:  Basename 11/25/11 0630 11/24/11 2300  WBC 9.5 11.5*  HGB 12.4 13.6  HCT 37.3 40.1  PLT 300 351    Basename 11/25/11 0630 11/24/11 2300  NA 135 135  K 3.6 2.8*  CL 98 95*  CO2 27 29  GLUCOSE 81 95  BUN 6 4*  CREATININE 0.50 0.66  CALCIUM 8.4 9.5   No results found for this or any previous visit (from the past 240 hour(s)).   Studies/Results: Dg Chest 2 View  11/24/2011  *RADIOLOGY REPORT*  Clinical Data: Substernal chest pain, dizziness.  CHEST - 2 VIEW  Comparison: 10/31/2010  Findings: Heart and mediastinal contours are within normal limits. No focal opacities or effusions.  No acute bony abnormality.  IMPRESSION: No active cardiopulmonary disease.  Original Report Authenticated  By: Cyndie Chime, M.D.    Medications: Scheduled Meds:    . enoxaparin  40 mg Subcutaneous Q24H  . ibuprofen  600 mg Oral TID  . lamoTRIgine  200 mg Oral Q0700  . metoprolol succinate  25 mg Oral Daily  . nortriptyline  25 mg Oral QHS  . ondansetron  4 mg Oral Q6H  . pantoprazole  40 mg Oral BID AC  . venlafaxine  150 mg Oral Q breakfast   Continuous Infusions:  PRN Meds:.acetaminophen, acetaminophen, albuterol, alum & mag hydroxide-simeth, guaiFENesin-dextromethorphan, morphine, ondansetron (ZOFRAN) IV, ondansetron, oxyCODONE-acetaminophen  Assessment/Plan:  Principal Problem:  *Chest pain: This is certainly, atypical. No arrhythmias recorded so far, cardiac enzymes are unelevated, and D-Dimer is normal. Besides, patient has localized chest wall tenderness. Although patient has risk factors of obesity and HTN, chest pain appears non cardiac. Cardiology consultation was provided by Dr Rennis Golden [facility]), and he has recommended echocardiogram, to evaluate for cardiomyopathy or valvular heart defect, as a cause of SOBOE, given history of Phentermine therapy. According to patient, she had a negative stress test 6 years ago, and has had a negative PSG. Protonix was increased to b.i.d on 11/25/11. Will add NSAID, as suggested by cardiologist. Rheumatology w/u is in progress, but though CRP is elevated, Rheumatoid factor is negative. Will order ANA and Anti-Ds DNA. Active Problems: 1. Hypokalemia: Likely secondary to pre-admission Hydrochlorothiazide. Repleted accordingly. 2. HTN. Controlled. 3. Bipolar disorder: Stable, on pre-admission psychotropics. 4. Dyspnea on exertion: see above.  Comment: Will DC, when cleared by cardiologist..  LOS: 2  days   Katie [name],[name] 11/26/2011, 1:46 PM

## 2011-11-26 NOTE — Progress Notes (Signed)
Subjective: Pain in chest continues to be 3/10.  Objective: Vital signs in last 24 hours: Temp:  [97.8 F (36.6 C)-98.2 F (36.8 C)] 98 F (36.7 C) (01/20 0500) Pulse Rate:  [49-57] 57  (01/20 0900) Resp:  [18-20] 18  (01/20 0500) BP: (94-106)/(56-66) 102/65 mmHg (01/20 0900) SpO2:  [98 %-100 %] 98 % (01/20 0500) Weight change:  Last BM Date: 11/24/11 Intake/Output from previous day:   Intake/Output this shift:    PE: General: Sleepy, still with mild 3/10 pain, improved with Percocet. Heart:S1S2, RRR  Lungs:clear Abd:+BS soft non tender Ext:no edema   Lab Results:  Basename 11/25/11 0630 11/24/11 2300  WBC 9.5 11.5*  HGB 12.4 13.6  HCT 37.3 40.1  PLT 300 351   BMET  Basename 11/25/11 0630 11/24/11 2300  NA 135 135  K 3.6 2.8*  CL 98 95*  CO2 27 29  GLUCOSE 81 95  BUN 6 4*  CREATININE 0.50 0.66  CALCIUM 8.4 9.5    Basename 11/25/11 1130 11/25/11 0528  TROPONINI <0.30 <0.30    Lab Results  Component Value Date   CHOL 157 01/27/2010   HDL 47 01/27/2010   LDLCALC 96 01/27/2010   TRIG 69 01/27/2010   CHOLHDL 3.3 Ratio 01/27/2010   Lab Results  Component Value Date   HGBA1C 5.9* 11/25/2011     Lab Results  Component Value Date   TSH 1.050 11/25/2011    Hepatic Function Panel  Basename 11/25/11 0630 11/24/11 2300  PROT 6.5 --  ALBUMIN 2.9* --  AST 11 --  ALT 7 --  ALKPHOS 54 --  BILITOT 0.5 --  BILIDIR -- <0.1  IBILI -- NOT CALCULATED   No results found for this basename: CHOL in the last 72 hours No results found for this basename: PROTIME in the last 72 hours    EKG: Orders placed during the hospital encounter of 11/24/11  . EKG 12-LEAD  . EKG 12-LEAD  . EKG 12-LEAD  . EKG 12-LEAD  . EKG 12-LEAD  . EKG    Studies/Results: Dg Chest 2 View  11/24/2011  *RADIOLOGY REPORT*  Clinical Data: Substernal chest pain, dizziness.  CHEST - 2 VIEW  Comparison: 10/31/2010  Findings: Heart and mediastinal contours are within normal limits. No focal  opacities or effusions.  No acute bony abnormality.  IMPRESSION: No active cardiopulmonary disease.  Original Report Authenticated By: Cyndie Chime, M.D.    Medications: I have reviewed the patient's current medications.    . enoxaparin  40 mg Subcutaneous Q24H  . lamoTRIgine  200 mg Oral Q0700  . metoprolol succinate  25 mg Oral Daily  . nortriptyline  25 mg Oral QHS  . ondansetron  4 mg Oral Q6H  . pantoprazole  40 mg Oral BID AC  . venlafaxine  150 mg Oral Q breakfast   Assessment/Plan: Patient Active Problem List  Diagnoses  . VITAMIN D DEFICIENCY  . OBESITY, NOS  . BIPOLAR AFFECTIVE DISORDER, DEPRESSED  . DEPRESSIVE DISORDER, NOS  . HEARING LOSS NOS OR DEAFNESS  . ESSENTIAL HYPERTENSION, BENIGN  . EDEMA-LEGS,DUE TO VENOUS OBSTRUCT.  . IRRITABLE BOWEL SYNDROME  . OSTEOARTHRITIS, KNEES, BILATERAL, SEVERE  . ROTATOR CUFF SYNDROME, RIGHT  . PLANTAR FASCIITIS  . INSOMNIA  . DYSMENORRHEA  . GERD (gastroesophageal reflux disease)  . Chest pain  . Hypokalemia   PLAN: HR down to 47, pain more controlled with percocet. Neg cardiac enzymes.  LOS: 2 days   Katie [name] R 11/26/2011, 10:35 AM

## 2011-11-27 DIAGNOSIS — F341 Dysthymic disorder: Secondary | ICD-10-CM

## 2011-11-27 DIAGNOSIS — R0602 Shortness of breath: Secondary | ICD-10-CM

## 2011-11-27 LAB — BASIC METABOLIC PANEL
BUN: 8 mg/dL (ref 6–23)
CO2: 30 mEq/L (ref 19–32)
Chloride: 99 mEq/L (ref 96–112)
Creatinine, Ser: 0.69 mg/dL (ref 0.50–1.10)
Glucose, Bld: 81 mg/dL (ref 70–99)

## 2011-11-27 LAB — ANA: Anti Nuclear Antibody(ANA): NEGATIVE

## 2011-11-27 NOTE — Progress Notes (Signed)
Utilization Review Completed.Deven Audi T1/21/2013   

## 2011-11-27 NOTE — Progress Notes (Signed)
Subjective:  Still c/o chest pain, still SOB at times.  Objective:  Vital Signs in the last 24 hours: Temp:  [97.8 F (36.6 C)-98.2 F (36.8 C)] 97.9 F (36.6 C) (01/21 0500) Pulse Rate:  [48-62] 62  (01/21 0944) Resp:  [16-18] 18  (01/21 0500) BP: (96-110)/(63-70) 101/63 mmHg (01/21 0944) SpO2:  [100 %] 100 % (01/21 0500)  Intake/Output from previous day:  Intake/Output Summary (Last 24 hours) at 11/27/11 1015 Last data filed at 11/27/11 0900  Gross per 24 hour  Intake    240 ml  Output      0 ml  Net    240 ml    Physical Exam: General appearance: alert, cooperative and no distress Lungs: clear to auscultation bilaterally with diminished breath sounds at the bases Heart: regular rate and rhythm, 1/6 syst murmur LSB Extremity: no edema no calf tenderness.   Rate: 62  Rhythm: normal sinus rhythm  Lab Results:  Basename 11/25/11 0630 11/24/11 2300  WBC 9.5 11.5*  HGB 12.4 13.6  PLT 300 351    Basename 11/27/11 0600 11/25/11 0630  NA 134* 135  K 4.0 3.6  CL 99 98  CO2 30 27  GLUCOSE 81 81  BUN 8 6  CREATININE 0.69 0.50    Basename 11/25/11 1130 11/25/11 0528  TROPONINI <0.30 <0.30   Hepatic Function Panel  Basename 11/25/11 0630 11/24/11 2300  PROT 6.5 --  ALBUMIN 2.9* --  AST 11 --  ALT 7 --  ALKPHOS 54 --  BILITOT 0.5 --  BILIDIR -- <0.1  IBILI -- NOT CALCULATED   No results found for this basename: CHOL in the last 72 hours No results found for this basename: INR in the last 72 hours  Imaging: Imaging results have been reviewed  Cardiac Studies:  Assessment/Plan:   Principal Problem:  *Chest pain, atypical  Active Problems:  BIPOLAR AFFECTIVE DISORDER, DEPRESSED  DEPRESSIVE DISORDER, NOS  Hypokalemia  Plan: Discussed with Dr Rennis Golden, RA enlargement on 2D.    [employer] PA-C 11/27/2011, 10:15 AM

## 2011-11-27 NOTE — Progress Notes (Signed)
Pt. Seen and examined. Agree with the NP/PA-C note as written. Still complaining of chest wall pain. ?fibromyalgia, chest wall syndrome? Not improved with NSAID. Percocet helps. Echo was generally unremarkable, however, RA pressure may be slightly high. No signs of PE (bradycardic, normal O2 on room air). Don't feel there are any active cardiac explanations for these symptoms. Further rheum workup pending.  May want to consider PFT's or pulm. Evaluation for dyspnea.  Will sign-off and defer further work-up or discharge to you.  Chrystie Nose, MD Attending Cardiologist [facility]

## 2011-11-27 NOTE — Progress Notes (Signed)
Subjective: Asymptomatic.  Objective: Vital signs in last 24 hours: Temp:  [97.8 F (36.6 C)-98.1 F (36.7 C)] 98.1 F (36.7 C) (01/21 1300) Pulse Rate:  [50-62] 56  (01/21 1300) Resp:  [16-18] 16  (01/21 1300) BP: (92-101)/(56-65) 92/56 mmHg (01/21 1300) SpO2:  [100 %] 100 % (01/21 1300) Weight change:  Last BM Date: 11/24/11  Intake/Output from previous day:   Total I/O In: 240 [P.O.:240] Out: -    Physical Exam: General: Comfortable, alert, communicative, fully oriented, not short of breath at rest.  HEENT:  No clinical pallor, no jaundice, no conjunctival injection or discharge. Hydration is fair. NECK:  Supple, JVP not seen, no carotid bruits, no palpable lymphadenopathy, no palpable goiter. CHEST:  Clinically clear to auscultation, no wheezes, no crackles. Has localized chest wall soreness, to sternal pressure. HEART:  Sounds 1 and 2 heard, normal, regular, no murmurs. ABDOMEN:  Morbidly obese, soft, non-tender, no palpable organomegaly, no palpable masses, normal bowel sounds. GENITALIA:  Not examined. LOWER EXTREMITIES:  No pitting edema, palpable peripheral pulses. MUSCULOSKELETAL SYSTEM:  unremarkable. CENTRAL NERVOUS SYSTEM:  No focal neurologic deficit on gross examination.  Lab Results:  Basename 11/25/11 0630 11/24/11 2300  WBC 9.5 11.5*  HGB 12.4 13.6  HCT 37.3 40.1  PLT 300 351    Basename 11/27/11 0600 11/25/11 0630  NA 134* 135  K 4.0 3.6  CL 99 98  CO2 30 27  GLUCOSE 81 81  BUN 8 6  CREATININE 0.69 0.50  CALCIUM 8.4 8.4   No results found for this or any previous visit (from the past 240 hour(s)).   Studies/Results: No results found.  Medications: Scheduled Meds:    . enoxaparin  40 mg Subcutaneous Q24H  . ibuprofen  600 mg Oral TID  . lamoTRIgine  200 mg Oral Q0700  . metoprolol succinate  25 mg Oral Daily  . nortriptyline  25 mg Oral QHS  . ondansetron  4 mg Oral Q6H  . pantoprazole  40 mg Oral BID AC  . polyethylene glycol   17 g Oral Daily  . venlafaxine  150 mg Oral Q breakfast   Continuous Infusions:  PRN Meds:.acetaminophen, acetaminophen, albuterol, alum & mag hydroxide-simeth, guaiFENesin-dextromethorphan, morphine, ondansetron (ZOFRAN) IV, ondansetron, oxyCODONE-acetaminophen  Assessment/Plan:  Principal Problem:  *Chest pain: This is certainly, atypical. No arrhythmias recorded so far, cardiac enzymes are unelevated, and D-Dimer is normal. Besides, patient has localized chest wall tenderness. Although patient has risk factors of obesity and HTN, chest pain appears non cardiac. Cardiology consultation was provided by Dr Rennis Golden [facility]), and he recommended echocardiogram done 11/26/11, to evaluate for cardiomyopathy or valvular heart defect, as a cause of SOBOE, given history of Phentermine therapy. This showed normal left ventriclar cavity size, estimated ejection fraction of 55% to 60%, and no regional wall motion abnormalities. Left ventricular diastolic function parameters were normal. The IVC measures <2.1 cm, but does not collapse >50%, suggesting an elevated RA pressure of 10 mmHg. According to patient, she had a negative stress test 6 years ago, and has had a negative PSG. Protonix was increased to b.i.d on 11/25/11, as was NSAID, as suggested by cardiologist. Rheumatology w/u is negative for ANA, Anti-Ds DNA, and Rheumatoid factor is negative. Active Problems: 1. Hypokalemia: Likely secondary to pre-admission Hydrochlorothiazide. Repleted accordingly, and has now resolved. 2. HTN. Controlled. 3. Bipolar disorder: Stable, on pre-admission psychotropics. 4. Dyspnea on exertion: See above. Dr Rennis Golden has recommended Pulmonary consultation, which has been requested.  Comment: Will DC, when cleared  by Pulmonologist.   LOS: 3 days   Katie [name],[name] 11/27/2011, 3:50 PM

## 2011-11-27 NOTE — Consult Note (Signed)
Name: Katie [name] MRN: [id number] DOB: Jul 09, 1966    LOS: 3  PCCM CONSULTATION NOTE  History of Present Illness: This is 46 yo AAF with obesity, sedentary life stile and depression who is being evaluated for dyspnea she reports having for several years.  It is intermittent and described as "inability to take deep breath in".  It happens at rest and on exertion and usually triggered by emotional stress. The episodes last several minutes and resolved spontaneously.  They are not associated with cough, wheezing, chest pain or palpitations.  She is lifelong smoker with some exposure to second hand smoke about 15 years ago.  There are no other occupational exposures.  The degree of her activity is limited to walking around the house and driving to the local stores.  She reports that her activity is being limited by being weak and tired, but not by dyspnea or chest pain.  Previous workup included polysomnography which reportedly ruled out OSA and PFT the results of which are not known.  This was done in [place] years ago.  Past Medical History  Diagnosis Date  . OA (osteoarthritis)   . HTN (hypertension)   . Obesity   . Bipolar 1 disorder   . Depression   . Depression   . Back pain   . Anxiety   . Sciatica    Past Surgical History  Procedure Date  . Cholecystectomy 2007  . Cardiac catheterization 2006    normal  . Cesarean section 2002  . Right knee sur 2005  . Tonsillectomy    Prior to Admission medications   Medication Sig Start Date End Date Taking? Authorizing Provider  Ascorbic Acid (VITAMIN C PO) Take 2 tablets by mouth daily.     Yes Historical Provider, MD  Calcium Carbonate-Vitamin D (CALCIUM 600+D) 600-400 MG-UNIT per tablet Take 1 tablet by mouth 2 (two) times daily with meals.     Yes Historical Provider, MD  desvenlafaxine (PRISTIQ) 100 MG 24 hr tablet Take 100 mg by mouth daily.     Yes Historical Provider, MD  hydrochlorothiazide 25 MG tablet take 1 tablet by mouth once  daily for blood pressure 05/09/11  Yes Antoine Primas, DO  lamoTRIgine (LAMICTAL) 200 MG tablet Take 200 mg by mouth every morning.     Yes Historical Provider, MD  metoprolol succinate (TOPROL-XL) 25 MG 24 hr tablet take 1 tablet by mouth once daily for blood pressure 05/09/11  Yes Antoine Primas, DO  nortriptyline (PAMELOR) 25 MG capsule take 1 capsule by mouth every evening 07/13/11  Yes Antoine Primas, DO  omeprazole (PRILOSEC) 20 MG capsule Take 20 mg by mouth daily.     Yes Historical Provider, MD  oxyCODONE-acetaminophen (PERCOCET) 5-325 MG per tablet Take 1 tablet by mouth every 4 (four) hours as needed. For pain    Yes Historical Provider, MD  phentermine 37.5 MG capsule Take 37.5 mg by mouth every morning.     Yes Historical Provider, MD  POTASSIUM PO Take 1 tablet by mouth daily. Over the counter SAMS potassium.   Yes Historical Provider, MD  ondansetron (ZOFRAN) 4 MG tablet Take 1 tablet (4 mg total) by mouth every 6 (six) hours. 09/26/11 10/03/11  Dorthula Matas, PA   Allergies Allergies  Allergen Reactions  . Vicodin (Hydrocodone-Acetaminophen)     Hives & swelling    Family History Family History  Problem Relation Age of Onset  . Heart disease Mother   . Diabetes Father    Social History  reports that she has never smoked. She has never used smokeless tobacco. She reports that she drinks alcohol. She reports that she does not use illicit drugs.  Review Of Systems  11 points review of systems is negative with an exception of listed in HPI.  Vital Signs: Temp:  [97.8 F (36.6 C)-98.1 F (36.7 C)] 98.1 F (36.7 C) (01/21 1300) Pulse Rate:  [50-62] 56  (01/21 1300) Resp:  [16-18] 16  (01/21 1300) BP: (92-101)/(56-65) 92/56 mmHg (01/21 1300) SpO2:  [100 %] 100 % (01/21 1300) I/O last 3 completed shifts: In: 240 [P.O.:240] Out: -   Physical Examination: General:  Morbidly obese, not in respiratory distress Neuro:  Awake, alert and cooperative withexam HEENT:  NCAT,  PERRL Neck:  Supple, no JVD   Cardiovascular:  RRR, no M/R/G Lungs:  Bilateral diminished air entry, no W/R/R Abdomen:  Obese, nontender, bowel sounds present Musculoskeletal:  No pedal edema, no muscle weakness Skin:  No rash  Labs and Imaging:   2D ECHO (1/20):  Normal LV function, no valvular disease, PA pressure is not reported. CXR (1/18):  No acute cardiopulmonary disease  Assessment and Plan:  Dyspnea of unclear etiology.  Doubt organic lung disease given episodic short lasting self resolving symptoms triggered by emotional stress and present for years.  Morbid obesity and deconditioning are likely contributing factors.  Of note, the description of "not being able to take deep enough breath in" is typical in setting of depression.   -->  Would start with full pulmonary function test (this does not have to be done as inpatient) -->  Would not hold discharge because of Pulmonary investigations -->  Please schedule with [facility] upon discharge   Orlean Bradford, M.D. Pulmonary and Critical Care Medicine [facility] Cell: [phone] Pager: [phone]  11/27/2011, 7:46 PM

## 2011-11-27 NOTE — Progress Notes (Signed)
   CARE MANAGEMENT NOTE 11/27/2011  Patient:  Katie [name], Katie [name]   Account Number:  [id number]  Date Initiated:  11/27/2011  Documentation initiated by:  Junius Creamer  Subjective/Objective Assessment:   adm w ch pain     Action/Plan:   lives w husband, pcp is [facility], medicaid recert in process   Anticipated DC Date:  11/28/2011   Anticipated DC Plan:  HOME/SELF CARE      DC Planning Services  CM consult      Choice offered to / List presented to:             Status of service:   Medicare Important Message given?   (If response is "NO", the following Medicare IM given date fields will be blank) Date Medicare IM given:   Date Additional Medicare IM given:    Discharge Disposition:  HOME/SELF CARE  Per UR Regulation:    Comments:  1/21 debbie Nataley Bahri rn,bsn [phone]

## 2011-11-28 ENCOUNTER — Inpatient Hospital Stay (HOSPITAL_COMMUNITY): Payer: Medicaid Other

## 2011-11-28 DIAGNOSIS — R0602 Shortness of breath: Secondary | ICD-10-CM

## 2011-11-28 DIAGNOSIS — F341 Dysthymic disorder: Secondary | ICD-10-CM

## 2011-11-28 LAB — PULMONARY FUNCTION TEST

## 2011-11-28 MED ORDER — ALBUTEROL SULFATE (5 MG/ML) 0.5% IN NEBU
2.5000 mg | INHALATION_SOLUTION | Freq: Once | RESPIRATORY_TRACT | Status: AC
  Administered 2011-11-28: 2.5 mg via RESPIRATORY_TRACT

## 2011-11-28 NOTE — Progress Notes (Signed)
Pt was given discharged instructions and her IV was taken out. The pt stated that she understood the information and did not have any questions. The pt is hemodynamically stable and ready for discharge. Katie [name]

## 2011-11-28 NOTE — Discharge Summary (Signed)
Physician Discharge Summary  Patient ID: Katie [name] MRN: [id number] DOB/AGE: 12/11/1965 46 y.o.  Admit date: 11/24/2011 Discharge date: 11/28/2011  Primary Care Physician:  Celine Mans, DO   Discharge Diagnoses:    Patient Active Problem List  Diagnoses  . VITAMIN D DEFICIENCY  . OBESITY, NOS  . BIPOLAR AFFECTIVE DISORDER, DEPRESSED  . DEPRESSIVE DISORDER, NOS  . HEARING LOSS NOS OR DEAFNESS  . ESSENTIAL HYPERTENSION, BENIGN  . EDEMA-LEGS,DUE TO VENOUS OBSTRUCT.  . IRRITABLE BOWEL SYNDROME  . OSTEOARTHRITIS, KNEES, BILATERAL, SEVERE  . ROTATOR CUFF SYNDROME, RIGHT  . PLANTAR FASCIITIS  . INSOMNIA  . DYSMENORRHEA  . GERD (gastroesophageal reflux disease)  . Chest pain  . Hypokalemia    Medication List  As of 11/28/2011 11:47 AM   STOP taking these medications         metoprolol succinate 25 MG 24 hr tablet         TAKE these medications         Calcium 600+D 600-400 MG-UNIT per tablet   Generic drug: Calcium Carbonate-Vitamin D   Take 1 tablet by mouth 2 (two) times daily with meals.      desvenlafaxine 100 MG 24 hr tablet   Commonly known as: PRISTIQ   Take 100 mg by mouth daily.      hydrochlorothiazide 25 MG tablet   Commonly known as: HYDRODIURIL   take 1 tablet by mouth once daily for blood pressure      lamoTRIgine 200 MG tablet   Commonly known as: LAMICTAL   Take 200 mg by mouth every morning.      nortriptyline 25 MG capsule   Commonly known as: PAMELOR   take 1 capsule by mouth every evening      omeprazole 20 MG capsule   Commonly known as: PRILOSEC   Take 20 mg by mouth daily.      ondansetron 4 MG tablet   Commonly known as: ZOFRAN   Take 1 tablet (4 mg total) by mouth every 6 (six) hours.      oxyCODONE-acetaminophen 5-325 MG per tablet   Commonly known as: PERCOCET   Take 1 tablet by mouth every 4 (four) hours as needed. For pain      phentermine 37.5 MG capsule   Take 37.5 mg by mouth every morning.      POTASSIUM PO   Take 1 tablet by mouth daily. Over the counter SAMS potassium.      VITAMIN C PO   Take 2 tablets by mouth daily.             Disposition and Follow-up:  Follow up with Primary MD, and with Dr Marchelle Gearing, [facility].  Consults:  cardiology and pulmonary/intensive care.  Zoila Shutter, Cardiologist, [facility]. Dr [facility].   Significant Diagnostic Studies:  Dg Chest 2 View  11/24/2011  *RADIOLOGY REPORT*  Clinical Data: Substernal chest pain, dizziness.  CHEST - 2 VIEW  Comparison: 10/31/2010  Findings: Heart and mediastinal contours are within normal limits. No focal opacities or effusions.  No acute bony abnormality.  IMPRESSION: No active cardiopulmonary disease.  Original Report Authenticated By: Cyndie Chime, M.D.   Brief H and P: For complete details, refer to admission H and P. However, in brief, this is a 46 year old female, wih history of OA (osteoarthritis); HTN (hypertension); Obesity; Bipolar disorder; Depression, chronic back pain, Anxiety and Sciatic, presenting with chest pressure and mild intermittent dyspnea on exertion. She was admitted for further  evaluation, investigation and management.  Physical Exam: On 11/28/11. General: Comfortable, alert, communicative, fully oriented, not short of breath at rest.  HEENT: No clinical pallor, no jaundice, no conjunctival injection or discharge. Hydration is fair.  NECK: Supple, JVP not seen, no carotid bruits, no palpable lymphadenopathy, no palpable goiter.  CHEST: Clinically clear to auscultation, no wheezes, no crackles. Has localized chest wall soreness, to sternal pressure.  HEART: Sounds 1 and 2 heard, normal, regular, no murmurs.  ABDOMEN: Morbidly obese, soft, non-tender, no palpable organomegaly, no palpable masses, normal bowel sounds.  GENITALIA: Not examined.  LOWER EXTREMITIES: No pitting edema, palpable peripheral pulses.  MUSCULOSKELETAL SYSTEM: unremarkable.  CENTRAL NERVOUS SYSTEM:  No focal neurologic deficit on gross examination.  Hospital Course:  Principal Problem:  *Chest pain: This is certainly, atypical, based on patient's description, and the finding of reproducible chest wall tenderness, on physical examination. No arrhythmias were recorded, 12 lead EKG showed no acute  Ischemic changes, cardiac enzymes remained unelevated, and D-Dimer is normal, although patient has risk factors of obesity and HTN, chest pain appeared non cardiac. Cardiology consultation was provided by Dr Rennis Golden [facility]), and he recommended echocardiogram done 11/26/11, to evaluate for cardiomyopathy or valvular heart defect, as a cause of SOBOE, given history of Phentermine therapy. This showed normal left ventriclar cavity size, estimated ejection fraction of 55% to 60%, and no regional wall motion abnormalities. Left ventricular diastolic function parameters were normal. The IVC measured <2.1 cm, but does not collapse >50%, suggesting an elevated RA pressure of 10 mmHg. According to patient, she had a negative stress test 6 years ago, and has had a negative PSG. Patient was managed with PPI and NSAID therapy, with improvement in chest pain. Rheumatology w/u was negative for ANA, Anti-Ds DNA, and Rheumatoid factor.  Active Problems:  1. Hypokalemia: Likely secondary to pre-admission Hydrochlorothiazide. Repleted accordingly, and resolved.  2. HTN. Patient's BP was controlled in the initial few days of hospitalization, but by 11/28/11, BP was 95/63-92/54, and HR was 57-60. Beta-blocker has therefore, been discontinued, until re-evaluated by primary MD..  3. Bipolar disorder: This remained stable, on pre-admission psychotropics.  4. Dyspnea on exertion: See above. Dr Rennis Golden recommended Pulmonary consultation, which was kindly provided by Dr. Marin Shutter, who has opined that organic lung disease is unlikely, given episodic, short lasting, self resolving symptoms triggered by emotional stress and present for  years. Morbid obesity and deconditioning are likely contributing factors. He has recommended full pulmonary function testing, on an outpatient basis.   Comment: Patient is stable for discharge on 11/28/11.   Time spent on Discharge: 45 mins.  Signed: Muhamed Luecke,[name] 11/28/2011, 11:47 AM

## 2011-11-28 NOTE — Consult Note (Signed)
Name: Katie [name] MRN: [id number] DOB: [birth year]    LOS: 4  PCCM PROGRESS  NOTE  History of Present Illness: This is 46 yo AAF with obesity, sedentary life stile and depression who is being evaluated for dyspnea she reports having for several years.  It is intermittent and described as "inability to take deep breath in".  It happens at rest and on exertion and usually triggered by emotional stress. The episodes last several minutes and resolved spontaneously.  They are not associated with cough, wheezing, chest pain or palpitations.  She is lifelong smoker with some exposure to second hand smoke about 15 years ago.  There are no other occupational exposures.  The degree of her activity is limited to walking around the house and driving to the local stores.  She reports that her activity is being limited by being weak and tired, but not by dyspnea or chest pain.  Previous workup included polysomnography which reportedly ruled out OSA and PFT the results of which are not known.  This was done in [place] years ago.  Vital Signs: Temp:  [98.1 F (36.7 C)-98.6 F (37 C)] 98.6 F (37 C) (01/22 0500) Pulse Rate:  [56-62] 57  (01/22 0500) Resp:  [16-18] 18  (01/22 0500) BP: (92-101)/(54-63) 92/54 mmHg (01/22 0500) SpO2:  [100 %] 100 % (01/22 0500) I/O last 3 completed shifts: In: 240 [P.O.:240] Out: -   Physical Examination: General:  Morbidly obese, not in respiratory distress. OFF O@ with SATS 98% on RA Neuro:  Awake, alert and cooperative withexam HEENT:  NCAT, PERRL Neck:  Supple, no JVD   Cardiovascular:  RRR, no M/R/G Lungs:  Bilateral diminished air entry, no W/R/R Abdomen:  Obese, nontender, bowel sounds present Musculoskeletal:  No pedal edema, no muscle weakness Skin:  No rash  Labs and Imaging:   2D ECHO (1/20):  Normal LV function, no valvular disease, PA pressure is not reported. CXR (1/18):  No acute cardiopulmonary disease  Lab 11/27/11 0600 11/25/11 0630 11/24/11 2300    NA 134* 135 135  K 4.0 3.6 2.8*  CL 99 98 95*  CO2 30 27 29   BUN 8 6 4*  CREATININE 0.69 0.50 0.66  GLUCOSE 81 81 95    Lab 11/25/11 0630 11/24/11 2300  HGB 12.4 13.6  HCT 37.3 40.1  WBC 9.5 11.5*  PLT 300 351  No results found. ABG    Component Value Date/Time   TCO2 30 07/23/2010 1400    Assessment and Plan:  Dyspnea of unclear etiology.  Doubt organic lung disease given episodic short lasting self resolving symptoms triggered by emotional stress and present for years.  Morbid obesity and deconditioning are likely contributing factors.  Of note, the description of "not being able to take deep enough breath in" is typical in setting of depression as well as restrictive lung disease  -->  Would start with full pulmonary function test (this does not have to be done as inpatient) -->  Would not hold discharge because of Pulmonary investigations -->  Please schedule with [facility] upon discharge. Dr. Marchelle Gearing 12/20/11 @230  pm has been set up. FULL PFT'S ORDERED -->  PCCM will be available prn CALL IF NEEDED.   Brett Canales Minor ACNP [facility] Pager [phone] till 3 pm If no answer page [phone] 11/28/2011, 8:48 AM  Attending Addendum:  I have seen the patient, discussed the issues, test results and plans with S. Minor. I agree with the Assessment and Plans as outlined  above.  Levy Pupa, MD, PhD 11/28/2011, 11:28 AM [facility] [phone] or if no answer [phone]

## 2011-12-11 ENCOUNTER — Other Ambulatory Visit: Payer: Self-pay | Admitting: Internal Medicine

## 2011-12-11 DIAGNOSIS — Z1231 Encounter for screening mammogram for malignant neoplasm of breast: Secondary | ICD-10-CM

## 2011-12-20 ENCOUNTER — Institutional Professional Consult (permissible substitution): Payer: Medicaid Other | Admitting: Internal Medicine

## 2011-12-27 ENCOUNTER — Ambulatory Visit: Payer: Medicaid Other

## 2012-01-16 ENCOUNTER — Encounter: Payer: Self-pay | Admitting: Internal Medicine

## 2012-01-16 ENCOUNTER — Ambulatory Visit (INDEPENDENT_AMBULATORY_CARE_PROVIDER_SITE_OTHER): Payer: Medicaid Other | Admitting: Internal Medicine

## 2012-01-16 VITALS — BP 122/84 | HR 81 | Temp 98.4°F | Ht 65.0 in | Wt 255.2 lb

## 2012-01-16 DIAGNOSIS — R0989 Other specified symptoms and signs involving the circulatory and respiratory systems: Secondary | ICD-10-CM

## 2012-01-16 DIAGNOSIS — R0600 Dyspnea, unspecified: Secondary | ICD-10-CM

## 2012-01-16 DIAGNOSIS — R0602 Shortness of breath: Secondary | ICD-10-CM

## 2012-01-16 DIAGNOSIS — R0609 Other forms of dyspnea: Secondary | ICD-10-CM

## 2012-01-16 NOTE — Progress Notes (Signed)
Subjective:    Patient ID: Katie [name], female    DOB: June 23, 1966, 46 y.o.   MRN: [id number]  HPI IOV 01/16/2012  46 year old female.  reports that she has never smoked. She has never used smokeless tobacco. Body mass index is 42.47 kg/(m^2). on 01/16/2012  PCCM consult Jan 2013:   This is 46 yo AAF with obesity, sedentary life stile and depression who is being evaluated for dyspnea she reports having for several years. It is intermittent and described as "inability to take deep breath in". It happens at rest and on exertion and usually triggered by emotional stress. The episodes last several minutes and resolved spontaneously. They are not associated with cough, wheezing, chest pain or palpitations. She is lifelong smoker with some exposure to second hand smoke about 15 years ago. There are no other occupational exposures. The degree of her activity is limited to walking around the house and driving to the local stores. She reports that her activity is being limited by being weak and tired, but not by dyspnea or chest pain. Previous workup included polysomnography which reportedly ruled out OSA and PFT the results of which are not known. This was done in [place] years ago.  REC by PCCM consult: Dyspnea of unclear etiology. Doubt organic lung disease given episodic short lasting self resolving symptoms triggered by emotional stress and present for years. Morbid obesity and deconditioning are likely contributing factors. Of note, the description of "not being able to take deep enough breath in" is typical in setting of depression as well as restrictive lung disease . REfer PCCM . Get PFT done  OV 01/16/2012 Hx retakleChroinic dyspnea x 10 years. Insidious onset. Progressive slowly. CLass 3 activities bring it on. RElieved by rest. Also, sometimes random. Has panic attack hx but feels this is different. Occ cough but no wheeze. Has sister with asthma. No fever. No sputum. No hx of dyspnea worse by  triggers likes pollen, dust, weather. Denies hx of allergies. Says cardiac stress test in [place] 2005 and 2 more in [place] in 2006-2010 negative. Reportedly no OSA. Normal TSH 1.5 in Jan 2013. Has associated obesity. Walking desaturation test 185 feet x 3 laps: no desaturation but was dyspneic.   LABS PFts 11/28/11 Restriction due to obesity and FV distortion suggestive of VCD. FVC 1.9L/61%, fev1 1.47L/57%, Ratio 76 (83), TLC 3.6L/70%, DLCO could not be done CT chest 07/14/09 - some atx. No PE CXR Jan 2013 - clear Stress test 2005 - normal 2D echo Jan 2013 - ef 55-60% and normal    Past Medical History  Diagnosis Date  . OA (osteoarthritis)   . HTN (hypertension)   . Obesity   . Bipolar 1 disorder   . Depression   . Depression   . Back pain   . Anxiety   . Sciatica      Family History  Problem Relation Age of Onset  . Heart disease Mother   . Diabetes Father   . Asthma Sister     also had rheumatic fever as a child, died age 40     History   Social History  . Marital Status: Single    Spouse Name: N/A    Number of Children: N/A  . Years of Education: N/A   Occupational History  . Not on file.   Social History Main Topics  . Smoking status: Never Smoker   . Smokeless tobacco: Never Used   Comment: second hand smoke  . Alcohol  Use: Yes     occ  . Drug Use: No  . Sexually Active: Not on file   Other Topics Concern  . Not on file   Social History Narrative  . No narrative on file     Allergies  Allergen Reactions  . Vicodin (Hydrocodone-Acetaminophen)     Hives & swelling     Outpatient Prescriptions Prior to Visit  Medication Sig Dispense Refill  . Ascorbic Acid (VITAMIN C PO) Take 2 tablets by mouth daily.        . Calcium Carbonate-Vitamin D (CALCIUM 600+D) 600-400 MG-UNIT per tablet Take 1 tablet by mouth 2 (two) times daily with meals.        Marland Kitchen desvenlafaxine (PRISTIQ) 100 MG 24 hr tablet Take 100 mg by mouth daily.        . hydrochlorothiazide 25  MG tablet take 1 tablet by mouth once daily for blood pressure  90 tablet  1  . lamoTRIgine (LAMICTAL) 200 MG tablet Take 200 mg by mouth every morning.        . nortriptyline (PAMELOR) 25 MG capsule take 1 capsule by mouth every evening  31 capsule  2  . omeprazole (PRILOSEC) 20 MG capsule Take 20 mg by mouth daily.        Marland Kitchen oxyCODONE-acetaminophen (PERCOCET) 5-325 MG per tablet Take 1 tablet by mouth every 4 (four) hours as needed. For pain       . phentermine 37.5 MG capsule Take 37.5 mg by mouth every morning.        Marland Kitchen POTASSIUM PO Take 1 tablet by mouth daily. Over the counter SAMS potassium.      Marland Kitchen ondansetron (ZOFRAN) 4 MG tablet Take 1 tablet (4 mg total) by mouth every 6 (six) hours.  12 tablet  0          Review of Systems  Constitutional: Negative for fever and unexpected weight change.  HENT: Positive for congestion, rhinorrhea, trouble swallowing and postnasal drip. Negative for ear pain, nosebleeds, sore throat, sneezing, dental problem and sinus pressure.   Eyes: Negative for redness and itching.  Respiratory: Positive for cough, chest tightness and shortness of breath. Negative for wheezing.   Cardiovascular: Positive for palpitations and leg swelling.  Gastrointestinal: Negative for nausea and vomiting.  Genitourinary: Negative for dysuria.  Musculoskeletal: Positive for joint swelling.  Skin: Negative for rash.  Neurological: Negative for headaches.  Hematological: Does not bruise/bleed easily.  Psychiatric/Behavioral: Negative for dysphoric mood. The patient is not nervous/anxious.        Objective:   Physical Exam  Vitals reviewed. Constitutional: She is oriented to person, place, and time. She appears well-developed and well-nourished. No distress.       Body mass index is 42.47 kg/(m^2).   HENT:  Head: Normocephalic and atraumatic.  Right Ear: External ear normal.  Left Ear: External ear normal.  Mouth/Throat: Oropharynx is clear and moist. No  oropharyngeal exudate.  Eyes: Conjunctivae and EOM are normal. Pupils are equal, round, and reactive to light. Right eye exhibits no discharge. Left eye exhibits no discharge. No scleral icterus.       Proptosis +  Neck: Normal range of motion. Neck supple. No JVD present. No tracheal deviation present. No thyromegaly present.  Cardiovascular: Normal rate, regular rhythm, normal heart sounds and intact distal pulses.  Exam reveals no gallop and no friction rub.   No murmur heard. Pulmonary/Chest: Effort normal and breath sounds normal. No respiratory distress. She has no wheezes. She  has no rales. She exhibits no tenderness.  Abdominal: Soft. Bowel sounds are normal. She exhibits no distension and no mass. There is no tenderness. There is no rebound and no guarding.  Musculoskeletal: Normal range of motion. She exhibits no edema and no tenderness.  Lymphadenopathy:    She has no cervical adenopathy.  Neurological: She is alert and oriented to person, place, and time. She has normal reflexes. No cranial nerve deficit. She exhibits normal muscle tone. Coordination normal.  Skin: Skin is warm and dry. No rash noted. She is not diaphoretic. No erythema. No pallor.  Psychiatric: She has a normal mood and affect. Her behavior is normal. Judgment and thought content normal.          Assessment & Plan:

## 2012-01-16 NOTE — Patient Instructions (Signed)
Nurse will walk you for oxygen levels today If this is okay, we will order methacholine challenge test for asthma; once this test is done call us so I can review and get back to you Based on this result, I will either have you come in for visit or do bike pulmonary stress test

## 2012-01-17 ENCOUNTER — Encounter: Payer: Self-pay | Admitting: Internal Medicine

## 2012-01-17 DIAGNOSIS — R0600 Dyspnea, unspecified: Secondary | ICD-10-CM | POA: Insufficient documentation

## 2012-01-17 NOTE — Assessment & Plan Note (Signed)
Obesity, Asthma, VCD in differential diagnosis. 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 She has hx of depession. Will get methacholine challenge test first and depending on that CPST bike test. She is agreeable with plan

## 2012-01-24 ENCOUNTER — Encounter (HOSPITAL_COMMUNITY): Payer: Self-pay | Admitting: Adult Health

## 2012-01-24 ENCOUNTER — Emergency Department (HOSPITAL_COMMUNITY)
Admission: EM | Admit: 2012-01-24 | Discharge: 2012-01-24 | Disposition: A | Discharge status: Home / Self Care | Payer: Self-pay | Attending: Emergency Medicine | Admitting: Emergency Medicine

## 2012-01-24 DIAGNOSIS — F411 Generalized anxiety disorder: Secondary | ICD-10-CM | POA: Insufficient documentation

## 2012-01-24 DIAGNOSIS — F319 Bipolar disorder, unspecified: Secondary | ICD-10-CM | POA: Insufficient documentation

## 2012-01-24 DIAGNOSIS — R21 Rash and other nonspecific skin eruption: Secondary | ICD-10-CM | POA: Insufficient documentation

## 2012-01-24 DIAGNOSIS — M1990 Unspecified osteoarthritis, unspecified site: Secondary | ICD-10-CM | POA: Insufficient documentation

## 2012-01-24 DIAGNOSIS — T7840XA Allergy, unspecified, initial encounter: Secondary | ICD-10-CM | POA: Insufficient documentation

## 2012-01-24 DIAGNOSIS — I10 Essential (primary) hypertension: Secondary | ICD-10-CM | POA: Insufficient documentation

## 2012-01-24 MED ORDER — DIPHENHYDRAMINE HCL 25 MG PO CAPS
25.0000 mg | ORAL_CAPSULE | Freq: Once | ORAL | Status: AC
  Administered 2012-01-24: 25 mg via ORAL

## 2012-01-24 MED ORDER — FAMOTIDINE 20 MG PO TABS
ORAL_TABLET | ORAL | Status: AC
  Filled 2012-01-24: qty 2

## 2012-01-24 MED ORDER — PREDNISONE 20 MG PO TABS
ORAL_TABLET | ORAL | Status: AC
  Filled 2012-01-24: qty 3

## 2012-01-24 MED ORDER — PREDNISONE 20 MG PO TABS
60.0000 mg | ORAL_TABLET | Freq: Once | ORAL | Status: AC
  Administered 2012-01-24: 60 mg via ORAL

## 2012-01-24 MED ORDER — PREDNISONE 20 MG PO TABS: 40.0000 mg | ORAL_TABLET | Freq: Every day | ORAL | Status: AC

## 2012-01-24 MED ORDER — FAMOTIDINE 20 MG PO TABS
20.0000 mg | ORAL_TABLET | Freq: Once | ORAL | Status: AC
  Administered 2012-01-24: 20 mg via ORAL

## 2012-01-24 MED ORDER — DIPHENHYDRAMINE HCL 25 MG PO CAPS
ORAL_CAPSULE | ORAL | Status: AC
  Filled 2012-01-24: qty 1

## 2012-01-24 NOTE — ED Notes (Signed)
Pt was placed on Percocet and a few days ago noticed itching, today she began having swelling of eyelids and hands and itchiness as well as feeling like her throat was closing. SATs 100% RA, respiration even and unlabored. No stridor, good air movement bilaterally.

## 2012-01-24 NOTE — Discharge Instructions (Signed)
Katie [name] you may have had an allergic reaction today.  The rash got better with the benadryl, pepcid and prednisone.  Continue taking these meds x 4-5 days. Take the benadryl every 4-6 hours x 24 then you can taper.  Take the prednisone as directed.   Pepcid 20 mg once a day for 5 days. Keep your followup appointments with Dr. Eula Listen in one. Return if shortness of breath swelling in her lips or tongue or any other concerns.   Allergic Reaction, Mild to Moderate Allergies may happen from anything your body is sensitive to. This may be food, medications, pollens, chemicals, and nearly anything around you in everyday life that produces allergens. An allergen is anything that causes an allergy producing substance. Allergens cause your body to release allergic antibodies. Through a chain of events, they cause a release of histamine into the blood stream. Histamines are meant to protect you, but they also cause your discomfort. This is why antihistamines are often used for allergies. Heredity is often a factor in causing allergic reactions. This means you may have some of the same allergies as your parents. Allergies happen in all age groups. You may have some idea of what caused your reaction. There are many allergens around [place]. It may be difficult to know what caused your reaction. If this is a first time event, it may never happen again. Allergies cannot be cured but can be controlled with medications. SYMPTOMS  You may get some or all of the following problems from allergies.  Swelling and itching in and around the mouth.   Tearing, itchy eyes.   Nasal congestion and runny nose.   Sneezing and coughing.   An itchy red rash or hives.   Vomiting or diarrhea.   Difficulty breathing.  Seasonal allergies occur in all age groups. They are seasonal because they usually occur during the same season every year. They may be a reaction to molds, grass pollens, or tree pollens. Other causes of allergies are  house dust mite allergens, pet dander and mold spores. These are just a common few of the thousands of allergens around [place]. All of the symptoms listed above happen when you come in contact with pollens and other allergens. Seasonal allergies are usually not life threatening. They are generally more of a nuisance that can often be handled using medications. Hay fever is a combination of all or some of the above listed allergy problems. It may often be treated with simple over-the-counter medications such as diphenhydramine. Take medication as directed. Check with your caregiver or package insert for child dosages. TREATMENT AND HOME CARE INSTRUCTIONS If hives or rash are present:  Take medications as directed.   You may use an over-the-counter antihistamine (diphenhydramine) for hives and itching as needed. Do not drive or drink alcohol until medications used to treat the reaction have worn off. Antihistamines tend to make people sleepy.   Apply cold cloths (compresses) to the skin or take baths in cool water. This will help itching. Avoid hot baths or showers. Heat will make a rash and itching worse.   If your allergies persist and become more severe, and over the counter medications are not effective, there are many new medications your caretaker can prescribe. Immunotherapy or desensitizing injections can be used if all else fails. Follow up with your caregiver if problems continue.  SEEK MEDICAL CARE IF:   Your allergies are becoming progressively more troublesome.   You suspect a food allergy. Symptoms generally happen within  30 minutes of eating a food.   Your symptoms have not gone away within 2 days or are getting worse.   You develop new symptoms.   You want to retest yourself or your child with a food or drink you think causes an allergic reaction. Never test yourself or your child of a suspected allergy without being under the watchful eye of your caregivers. A second exposure to an  allergen may be life-threatening.  SEEK IMMEDIATE MEDICAL CARE IF:  You develop difficulty breathing or wheezing, or have a tight feeling in your chest or throat.   You develop a swollen mouth, hives, swelling, or itching all over your body.  A severe reaction with any of the above problems should be considered life-threatening. If you suddenly develop difficulty breathing call for local emergency medical help. THIS IS AN EMERGENCY. MAKE SURE YOU:   Understand these instructions.   Will watch your condition.   Will get help right away if you are not doing well or get worse.  Document Released: 08/20/2007 Document Revised: 10/12/2011 Document Reviewed: 08/20/2007 [facility] Patient Information 2012 [place], [place].

## 2012-01-24 NOTE — ED Provider Notes (Signed)
History     CSN: [id number]  Arrival date & time 01/24/12  1704   First MD Initiated Contact with Patient 01/24/12 1741      Chief Complaint  Patient presents with  . Allergic Reaction    (Consider location/radiation/quality/duration/timing/severity/associated sxs/prior treatment) [name] presenting with allergic reaction. The history is provided by the patient. No language interpreter was used.  Allergic Reaction The primary symptoms are  rash. The primary symptoms do not include wheezing, shortness of breath, cough, abdominal pain, nausea, vomiting, diarrhea, dizziness, palpitations or angioedema. The current episode started 1 to 2 hours ago. The problem has been gradually worsening. This is a new problem.  The rash is associated with itching.  Significant symptoms also include itching. Significant symptoms that are not present include eye redness, flushing or rhinorrhea.  Rash to her Upper extremities after she took some percocet for arthritis pain/sciatica.  Patient has been on percocet x 4 months. Benadryl, pepcid and prednisone given in the ER now the rash has resolved.  Denies SOB or any breathing problems.  She is chronic pain patient and she goes to [facility].  Has an appointment this Friday . pmh bipolar, hypertension. Allergic to vicodin with hives and swelling.  Past Medical History  Diagnosis Date  . OA (osteoarthritis)   . HTN (hypertension)   . Obesity   . Bipolar 1 disorder   . Depression   . Depression   . Back pain   . Anxiety   . Sciatica     Past Surgical History  Procedure Date  . Cholecystectomy 2007  . Cardiac catheterization 2006    normal  . Cesarean section 2002  . Right knee sur 2005  . Tonsillectomy     Family History  Problem Relation Age of Onset  . Heart disease Mother   . Diabetes Father   . Asthma Sister     also had rheumatic fever as a child, died age 108    History  Substance Use Topics  . Smoking status:  Never Smoker   . Smokeless tobacco: Never Used   Comment: second hand smoke  . Alcohol Use: Yes     occ    OB History    Grav Para Term Preterm Abortions TAB SAB Ect Mult Living                  Review of Systems  HENT: Negative for rhinorrhea.   Eyes: Negative for redness.  Respiratory: Negative for cough, chest tightness, shortness of breath and wheezing.   Cardiovascular: Negative for palpitations.  Gastrointestinal: Negative for nausea, vomiting, abdominal pain and diarrhea.  Skin: Positive for itching and rash. Negative for flushing.       Fine rash to upper extremities  Neurological: Negative for dizziness.  Psychiatric/Behavioral: The patient is nervous/anxious.     Allergies  Percocet and Vicodin  Home Medications   Current Outpatient Rx  Name Route Sig Dispense Refill  . VITAMIN C PO Oral Take 2 tablets by mouth daily.      . ASPIRIN 81 MG PO TABS Oral Take 81 mg by mouth daily.    Marland Kitchen CALCIUM CARBONATE-VITAMIN D 600-400 MG-UNIT PO TABS Oral Take 1 tablet by mouth 2 (two) times daily with meals.      . DESVENLAFAXINE SUCCINATE ER 100 MG PO TB24 Oral Take 100 mg by mouth daily.      Marland Kitchen HYDROCHLOROTHIAZIDE 25 MG PO TABS  take 1 tablet by mouth  once daily for blood pressure 90 tablet 1  . LAMOTRIGINE 200 MG PO TABS Oral Take 200 mg by mouth every morning.      Marland Kitchen NORTRIPTYLINE HCL 25 MG PO CAPS  take 1 capsule by mouth every evening 31 capsule 2  . OMEPRAZOLE 20 MG PO CPDR Oral Take 20 mg by mouth daily.      Marland Kitchen ONDANSETRON HCL 4 MG PO TABS Oral Take 4 mg by mouth every 6 (six) hours.    . OXYCODONE-ACETAMINOPHEN 5-325 MG PO TABS Oral Take 1 tablet by mouth every 4 (four) hours as needed. For pain     . PHENTERMINE HCL 37.5 MG PO CAPS Oral Take 37.5 mg by mouth every morning.      Marland Kitchen POTASSIUM PO Oral Take 1 tablet by mouth daily. Over the counter SAMS potassium.      BP 118/77  Pulse 59  Temp 98 F (36.7 C)  Resp 16  SpO2 99%  Physical Exam  Nursing note and  vitals reviewed. Constitutional: She is oriented to person, place, and time. She appears well-developed and well-nourished.       obese  HENT:  Head: Normocephalic and atraumatic.  Eyes: Conjunctivae and EOM are normal. Pupils are equal, round, and reactive to light.  Neck: Normal range of motion. Neck supple.  Cardiovascular: Normal rate, regular rhythm, normal heart sounds and intact distal pulses.  Exam reveals no gallop and no friction rub.   No murmur heard. Pulmonary/Chest: Effort normal and breath sounds normal.  Abdominal: Soft. Bowel sounds are normal.  Musculoskeletal: Normal range of motion. She exhibits no edema and no tenderness.  Neurological: She is alert and oriented to person, place, and time. She has normal reflexes.  Skin: Skin is warm and dry.  Psychiatric: She has a normal mood and affect.    ED Course  Procedures (including critical care time)  Labs Reviewed - No data to display No results found.   No diagnosis found.    MDM  Rash after taking a dose of percocet.  Unlikely cause of rash.  She has been taking x 4 months.  Rash resolved in ER after benadryl, pepcid and prednisone. Will follow up with Jovita Kussmaul Friday or return if worse.          Jethro Bastos, NP 01/25/12 1250

## 2012-01-26 ENCOUNTER — Ambulatory Visit (HOSPITAL_COMMUNITY)
Admission: RE | Admit: 2012-01-26 | Discharge: 2012-01-26 | Disposition: A | Discharge status: Home / Self Care | Payer: Medicaid Other | Source: Ambulatory Visit | Attending: Internal Medicine | Admitting: Internal Medicine

## 2012-01-26 DIAGNOSIS — R0602 Shortness of breath: Secondary | ICD-10-CM | POA: Insufficient documentation

## 2012-01-26 LAB — PULMONARY FUNCTION TEST

## 2012-01-26 MED ORDER — ALBUTEROL SULFATE (5 MG/ML) 0.5% IN NEBU
2.5000 mg | INHALATION_SOLUTION | Freq: Once | RESPIRATORY_TRACT | Status: AC
  Administered 2012-01-26: 2.5 mg via RESPIRATORY_TRACT

## 2012-01-26 MED ORDER — SODIUM CHLORIDE 0.9 % IN NEBU
3.0000 mL | INHALATION_SOLUTION | Freq: Once | RESPIRATORY_TRACT | Status: AC
  Administered 2012-01-26: 3 mL via RESPIRATORY_TRACT
  Filled 2012-01-26: qty 3

## 2012-01-26 MED ORDER — METHACHOLINE 16 MG/ML NEB SOLN: 2.0000 mL | Freq: Once | RESPIRATORY_TRACT | Status: DC

## 2012-01-26 MED ORDER — METHACHOLINE 0.0625 MG/ML NEB SOLN
2.0000 mL | Freq: Once | RESPIRATORY_TRACT | Status: AC
  Administered 2012-01-26: 0.125 mg via RESPIRATORY_TRACT

## 2012-01-26 MED ORDER — METHACHOLINE 0.25 MG/ML NEB SOLN: 2.0000 mL | Freq: Once | RESPIRATORY_TRACT | Status: DC

## 2012-01-26 MED ORDER — METHACHOLINE 4 MG/ML NEB SOLN: 2.0000 mL | Freq: Once | RESPIRATORY_TRACT | Status: DC

## 2012-01-26 MED ORDER — METHACHOLINE 1 MG/ML NEB SOLN: 2.0000 mL | Freq: Once | RESPIRATORY_TRACT | Status: DC

## 2012-01-29 ENCOUNTER — Emergency Department (HOSPITAL_COMMUNITY): Payer: Medicaid Other

## 2012-01-29 ENCOUNTER — Encounter (HOSPITAL_COMMUNITY): Payer: Self-pay | Admitting: *Deleted

## 2012-01-29 ENCOUNTER — Emergency Department (HOSPITAL_COMMUNITY)
Admission: EM | Admit: 2012-01-29 | Discharge: 2012-01-29 | Disposition: A | Discharge status: Home / Self Care | Payer: Medicaid Other | Attending: Emergency Medicine | Admitting: Emergency Medicine

## 2012-01-29 DIAGNOSIS — M549 Dorsalgia, unspecified: Secondary | ICD-10-CM | POA: Insufficient documentation

## 2012-01-29 DIAGNOSIS — M1990 Unspecified osteoarthritis, unspecified site: Secondary | ICD-10-CM | POA: Insufficient documentation

## 2012-01-29 DIAGNOSIS — Z79899 Other long term (current) drug therapy: Secondary | ICD-10-CM | POA: Insufficient documentation

## 2012-01-29 DIAGNOSIS — I10 Essential (primary) hypertension: Secondary | ICD-10-CM | POA: Insufficient documentation

## 2012-01-29 DIAGNOSIS — R112 Nausea with vomiting, unspecified: Secondary | ICD-10-CM | POA: Insufficient documentation

## 2012-01-29 DIAGNOSIS — F3130 Bipolar disorder, current episode depressed, mild or moderate severity, unspecified: Secondary | ICD-10-CM | POA: Insufficient documentation

## 2012-01-29 DIAGNOSIS — Z7982 Long term (current) use of aspirin: Secondary | ICD-10-CM | POA: Insufficient documentation

## 2012-01-29 DIAGNOSIS — R1013 Epigastric pain: Secondary | ICD-10-CM | POA: Insufficient documentation

## 2012-01-29 LAB — DIFFERENTIAL
Lymphs Abs: 1.4 10*3/uL (ref 0.7–4.0)
Monocytes Absolute: 0.4 10*3/uL (ref 0.1–1.0)
Monocytes Relative: 6 % (ref 3–12)
Neutro Abs: 5.1 10*3/uL (ref 1.7–7.7)
Neutrophils Relative %: 74 % (ref 43–77)

## 2012-01-29 LAB — LIPASE, BLOOD: Lipase: 18 U/L (ref 11–59)

## 2012-01-29 LAB — URINALYSIS, ROUTINE W REFLEX MICROSCOPIC
Ketones, ur: >80 mg/dL — AB
Nitrite: NEGATIVE
Protein, ur: NEGATIVE mg/dL
pH: 6.5 (ref 5.0–8.0)

## 2012-01-29 LAB — COMPREHENSIVE METABOLIC PANEL
Alkaline Phosphatase: 66 U/L (ref 39–117)
BUN: 6 mg/dL (ref 6–23)
CO2: 26 mEq/L (ref 19–32)
Chloride: 101 mEq/L (ref 96–112)
Creatinine, Ser: 0.58 mg/dL (ref 0.50–1.10)
GFR calc Af Amer: >90 mL/min (ref >90)
GFR calc non Af Amer: >90 mL/min (ref >90)
Glucose, Bld: 95 mg/dL (ref 70–99)
Potassium: 3.2 mEq/L — ABNORMAL LOW (ref 3.5–5.1)
Total Bilirubin: 1.2 mg/dL (ref 0.3–1.2)

## 2012-01-29 LAB — CBC
HCT: 41.1 % (ref 36.0–46.0)
Hemoglobin: 13.9 g/dL (ref 12.0–15.0)
RBC: 4.46 MIL/uL (ref 3.87–5.11)

## 2012-01-29 MED ORDER — MORPHINE SULFATE 4 MG/ML IJ SOLN
4.0000 mg | Freq: Once | INTRAMUSCULAR | Status: AC
  Administered 2012-01-29: 4 mg via INTRAVENOUS
  Filled 2012-01-29: qty 1

## 2012-01-29 MED ORDER — HYDROMORPHONE HCL 2 MG PO TABS: 2.0000 mg | ORAL_TABLET | ORAL | Status: AC | PRN

## 2012-01-29 MED ORDER — HYDROMORPHONE HCL PF 1 MG/ML IJ SOLN: 1.0000 mg | Freq: Once | INTRAMUSCULAR | Status: DC

## 2012-01-29 MED ORDER — HYDROMORPHONE HCL PF 1 MG/ML IJ SOLN
1.0000 mg | Freq: Once | INTRAMUSCULAR | Status: AC
  Administered 2012-01-29: 1 mg via INTRAVENOUS
  Filled 2012-01-29: qty 1

## 2012-01-29 MED ORDER — SODIUM CHLORIDE 0.9 % IV SOLN
INTRAVENOUS | Status: DC
  Administered 2012-01-29: 11:00:00 via INTRAVENOUS

## 2012-01-29 MED ORDER — OMEPRAZOLE 20 MG PO CPDR: 20.0000 mg | DELAYED_RELEASE_CAPSULE | Freq: Every day | ORAL | Status: DC

## 2012-01-29 MED ORDER — ONDANSETRON HCL 4 MG/2ML IJ SOLN
4.0000 mg | Freq: Once | INTRAMUSCULAR | Status: AC
  Administered 2012-01-29: 4 mg via INTRAVENOUS
  Filled 2012-01-29: qty 2

## 2012-01-29 MED ORDER — HYDROMORPHONE HCL PF 1 MG/ML IJ SOLN
1.0000 mg | Freq: Once | INTRAMUSCULAR | Status: AC
Start: 2012-01-29 — End: 2012-01-29
  Administered 2012-01-29: 1 mg via INTRAVENOUS
  Filled 2012-01-29: qty 1

## 2012-01-29 MED ORDER — IOHEXOL 300 MG/ML  SOLN
125.0000 mL | Freq: Once | INTRAMUSCULAR | Status: AC | PRN
  Administered 2012-01-29: 125 mL via INTRAVENOUS

## 2012-01-29 NOTE — ED Provider Notes (Signed)
History     CSN: [id number]  Arrival date & time 01/29/12  0865   First MD Initiated Contact with Patient 01/29/12 [phone]      Chief Complaint  Patient presents with  . Abdominal Pain  . Emesis    (Consider location/radiation/quality/duration/timing/severity/associated sxs/prior treatment) [name] presenting with abdominal pain and vomiting. The history is provided by the patient and medical records. The history is limited by the condition of the patient.  Abdominal Pain The primary symptoms of the illness include abdominal pain, nausea and vomiting. The primary symptoms of the illness do not include fever, shortness of breath, diarrhea or dysuria.  Symptoms associated with the illness do not include chills.  Emesis  Associated symptoms include abdominal pain. Pertinent negatives include no chills, no diarrhea, no fever and no headaches.   the patient is a 46 year old, female, with a history of abdominal surgery, including cholecystectomy, and C-section x2, who presents to emergency department complaining of upper abdominal pain, and vomiting since yesterday.  The pain started acutely.  The pain radiates to her back.  She denies respiratory symptoms, or diarrhea.  She denies urinary tract symptoms.  She's not having her menstrual cycle and denies vaginal discharge.  She drinks alcohol only occasionally and did not have alcohol yesterday.  She thinks that she has been diagnosed with peptic ulcer disease in the past.  Level V caveat applies for severe pain and urgent need for intervention  Past Medical History  Diagnosis Date  . OA (osteoarthritis)   . HTN (hypertension)   . Obesity   . Bipolar 1 disorder   . Depression   . Depression   . Back pain   . Anxiety   . Sciatica     Past Surgical History  Procedure Date  . Cholecystectomy 2007  . Cardiac catheterization 2006    normal  . Cesarean section 2002  . Right knee sur 2005  . Tonsillectomy     Family  History  Problem Relation Age of Onset  . Heart disease Mother   . Diabetes Father   . Asthma Sister     also had rheumatic fever as a child, died age 30    History  Substance Use Topics  . Smoking status: Never Smoker   . Smokeless tobacco: Never Used   Comment: second hand smoke  . Alcohol Use: Yes     occ    OB History    Grav Para Term Preterm Abortions TAB SAB Ect Mult Living                  Review of Systems  Constitutional: Negative for fever and chills.  Respiratory: Negative for shortness of breath.   Cardiovascular: Negative for chest pain.  Gastrointestinal: Positive for nausea, vomiting and abdominal pain. Negative for diarrhea.  Genitourinary: Negative for dysuria.  Neurological: Negative for headaches.  Psychiatric/Behavioral: Negative for confusion.  All other systems reviewed and are negative.    Allergies  Percocet and Vicodin  Home Medications   Current Outpatient Rx  Name Route Sig Dispense Refill  . VITAMIN C PO Oral Take 2 tablets by mouth daily.      . ASPIRIN 81 MG PO TABS Oral Take 81 mg by mouth daily.    Marland Kitchen CALCIUM CARBONATE-VITAMIN D 600-400 MG-UNIT PO TABS Oral Take 1 tablet by mouth 2 (two) times daily with meals.      . DESVENLAFAXINE SUCCINATE ER 100 MG PO TB24 Oral Take 100  mg by mouth daily.      Marland Kitchen HYDROCHLOROTHIAZIDE 25 MG PO TABS  take 1 tablet by mouth once daily for blood pressure 90 tablet 1  . LAMOTRIGINE 200 MG PO TABS Oral Take 200 mg by mouth every morning.      Marland Kitchen NORTRIPTYLINE HCL 25 MG PO CAPS  take 1 capsule by mouth every evening 31 capsule 2  . OMEPRAZOLE 20 MG PO CPDR Oral Take 20 mg by mouth daily.      Marland Kitchen ONDANSETRON HCL 4 MG PO TABS Oral Take 4 mg by mouth every 6 (six) hours.    . OXYCODONE-ACETAMINOPHEN 5-325 MG PO TABS Oral Take 1 tablet by mouth every 4 (four) hours as needed. For pain     . PHENTERMINE HCL 37.5 MG PO CAPS Oral Take 37.5 mg by mouth every morning.      Marland Kitchen POTASSIUM PO Oral Take 1 tablet by mouth  daily. Over the counter SAMS potassium.    Marland Kitchen PREDNISONE 20 MG PO TABS Oral Take 2 tablets (40 mg total) by mouth daily. 10 tablet 0    Dispense as written.    BP 139/88  Pulse 66  Temp(Src) 98.1 F (36.7 C) (Oral)  Resp 20  SpO2 100%  LMP 01/08/2012  Physical Exam  Vitals reviewed. Constitutional: She is oriented to person, place, and time. She appears well-developed and well-nourished.       The patient is sitting up rocking back and forth crying  HENT:  Head: Normocephalic and atraumatic.  Eyes: Pupils are equal, round, and reactive to light.  Neck: Normal range of motion.  Cardiovascular: Normal rate, regular rhythm and normal heart sounds.   No murmur heard. Pulmonary/Chest: Effort normal and breath sounds normal. No respiratory distress. She has no wheezes. She has no rales.  Abdominal: Soft. She exhibits no distension and no mass. There is tenderness. There is no rebound and no guarding.       Epigastric tenderness, with no peritoneal signs  Musculoskeletal: Normal range of motion. She exhibits no edema and no tenderness.  Neurological: She is alert and oriented to person, place, and time. No cranial nerve deficit.  Skin: Skin is warm and dry. No rash noted. No erythema.  Psychiatric: She has a normal mood and affect. Her behavior is normal.    ED Course  Procedures (including critical care time) 46 year old, female, presents with upper abdominal pain, nausea, and vomiting, and radiation to her back.  Since yesterday.  She has had prior abdominal surgery , including a cholecystectomy.  She denies recent alcohol use.  We will establish an IV perform an x-ray to look for bowel obstruction, and laboratory testing, to look for signs of pancreatitis or hepatitis.  An IV will be established and we will give her IV analgesics, and antiemetics   Labs Reviewed  CBC  DIFFERENTIAL  COMPREHENSIVE METABOLIC PANEL  LIPASE, BLOOD  URINALYSIS, ROUTINE W REFLEX MICROSCOPIC  PREGNANCY,  URINE   No results found.   No diagnosis found.    MDM  Abdominal pain No acute abdomen.  No signs of appendicitis, pancreatitis or bowel obstruction, symptoms improved in the emergency department        Cheri Guppy, MD 01/29/12 1416

## 2012-01-29 NOTE — ED Notes (Signed)
Pt c/o epigastric abd pain since this am. Emesis x3 in last 12 hours. Pt sts bowels have been "messed up" since 2007, cannot report diarrhea or change in bowels.

## 2012-01-29 NOTE — ED Provider Notes (Signed)
Medical screening examination/treatment/procedure(s) were performed by non-physician practitioner and as supervising physician I was immediately available for consultation/collaboration.  Tristyn Demarest, MD 01/29/12 0717 

## 2012-01-29 NOTE — ED Notes (Signed)
Pt on continuous pulse ox 100% on room air

## 2012-01-29 NOTE — Discharge Instructions (Signed)
CAT scan does not show any signs of an appendicitis, pancreatitis or bowel obstruction.  Follow up.  With your Dr. for reevaluation.  If your symptoms.  Last more than 3-4 days.  Return for worse or uncontrolled symptoms

## 2012-02-01 ENCOUNTER — Telehealth: Payer: Self-pay | Admitting: Internal Medicine

## 2012-02-01 NOTE — Telephone Encounter (Signed)
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 Please schedule ov fo her ; first available

## 2012-02-05 NOTE — Telephone Encounter (Signed)
[email or web address].Katie [name], CMA  

## 2012-02-06 ENCOUNTER — Other Ambulatory Visit: Payer: Self-pay | Admitting: Sports Medicine

## 2012-02-06 DIAGNOSIS — M545 Low back pain: Secondary | ICD-10-CM

## 2012-02-07 ENCOUNTER — Telehealth: Payer: Self-pay

## 2012-02-07 NOTE — Telephone Encounter (Signed)
[facility] home number listed but NA, no voicemail. I called pt sister, Dois Davenport and [email or web address]. Carron Curie, CMA

## 2012-02-07 NOTE — Telephone Encounter (Signed)
See 02/01/12 phone note

## 2012-02-07 NOTE — Telephone Encounter (Signed)
[email or web address] x1 

## 2012-02-07 NOTE — Telephone Encounter (Signed)
Spoke with pt and notified of recs per MR. She is already scheduled for rov with MR for 02/23/12 and so I advised to just be sure and keep this appt and he will discuss with her further at that time. Pt verbalized understanding and states nothing further needed.

## 2012-02-10 ENCOUNTER — Other Ambulatory Visit: Payer: Medicaid Other

## 2012-02-23 ENCOUNTER — Ambulatory Visit: Payer: Medicaid Other | Admitting: Internal Medicine

## 2012-02-28 ENCOUNTER — Encounter: Payer: Self-pay | Admitting: Internal Medicine

## 2012-02-28 ENCOUNTER — Ambulatory Visit (INDEPENDENT_AMBULATORY_CARE_PROVIDER_SITE_OTHER): Payer: Medicaid Other | Admitting: Internal Medicine

## 2012-02-28 VITALS — BP 120/70 | HR 74 | Temp 98.1°F | Ht 65.0 in | Wt 237.0 lb

## 2012-02-28 DIAGNOSIS — R0600 Dyspnea, unspecified: Secondary | ICD-10-CM

## 2012-02-28 DIAGNOSIS — J383 Other diseases of vocal cords: Secondary | ICD-10-CM

## 2012-02-28 DIAGNOSIS — R0609 Other forms of dyspnea: Secondary | ICD-10-CM

## 2012-02-28 NOTE — Progress Notes (Signed)
Subjective:    Patient ID: Katie [name], female    DOB: 1966-08-21, 46 y.o.   MRN: [id number]  HPI 47 year old female.  reports that she has never smoked. She has never used smokeless tobacco. Body mass index is 42.47 kg/(m^2). on 01/16/2012  PCCM consult Jan 2013:   This is 46 yo AAF with obesity, sedentary life stile and depression who is being evaluated for dyspnea she reports having for several years. It is intermittent and described as "inability to take deep breath in". It happens at rest and on exertion and usually triggered by emotional stress. The episodes last several minutes and resolved spontaneously. They are not associated with cough, wheezing, chest pain or palpitations. She is lifelong smoker with some exposure to second hand smoke about 15 years ago. There are no other occupational exposures. The degree of her activity is limited to walking around the house and driving to the local stores. She reports that her activity is being limited by being weak and tired, but not by dyspnea or chest pain. Previous workup included polysomnography which reportedly ruled out OSA and PFT the results of which are not known. This was done in [place] years ago.  REC by PCCM consult: Dyspnea of unclear etiology. Doubt organic lung disease given episodic short lasting self resolving symptoms triggered by emotional stress and present for years. Morbid obesity and deconditioning are likely contributing factors. Of note, the description of "not being able to take deep enough breath in" is typical in setting of depression as well as restrictive lung disease . REfer PCCM . Get PFT done  OV 01/16/2012 Hx retake: Chroinic dyspnea x 10 years. Insidious onset. Progressive slowly. CLass 3 activities bring it on. RElieved by rest. Also, sometimes random. Has panic attack hx but feels this is different. Occ cough but no wheeze. Has sister with asthma. No fever. No sputum. No hx of dyspnea worse by triggers likes  pollen, dust, weather. Denies hx of allergies. Says cardiac stress test in [place] 2005 and 2 more in [place] in 2006-2010 negative. Reportedly no OSA. Normal TSH 1.5 in Jan 2013. Has associated obesity. Walking desaturation test 185 feet x 3 laps: no desaturation but was dyspneic.   LABS PFts 11/28/11 Restriction due to obesity and FV distortion suggestive of VCD. FVC 1.9L/61%, fev1 1.47L/57%, Ratio 76 (83), TLC 3.6L/70%, DLCO could not be done CT chest 07/14/09 - some atx. 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 Here today to reviewe results. No interim issues. Dyspnea persists. No admissions or ER visits.   Past, Family, Social reviewed: no change since last visit except  lost weight due to low glycemic diet. Body mass index is 39.44 kg/(m^2). today (last visi twas 42.5)     Review of Systems  Constitutional: Negative for fever and unexpected weight change.  HENT: Negative for ear pain, nosebleeds, congestion, sore throat, rhinorrhea, sneezing, trouble swallowing, dental problem, postnasal drip and sinus pressure.   Eyes: Negative for redness and itching.  Respiratory: Negative for cough, chest tightness, shortness of breath and wheezing.   Cardiovascular: Negative for palpitations and leg swelling.  Gastrointestinal: Negative for nausea and vomiting.  Genitourinary: Negative for dysuria.  Musculoskeletal: Negative for joint swelling.  Skin: Negative for rash.  Neurological: Negative for headaches.  Hematological: Does not bruise/bleed easily.  Psychiatric/Behavioral: Negative for  dysphoric mood. The patient is not nervous/anxious.        Objective:   Physical Exam Vitals reviewed. Constitutional: She is oriented to person, place, and time. She appears well-developed and well-nourished. No distress.       Body mass index is 42.47 kg/(m^2). - 01/16/12  Body mass index is 39.44 kg/(m^2).  02/28/2012     HENT:  Head: Normocephalic and atraumatic.  Right Ear: External ear normal.  Left Ear: External ear normal.  Mouth/Throat: Oropharynx is clear and moist. No oropharyngeal exudate.  Eyes: Conjunctivae and EOM are normal. Pupils are equal, round, and reactive to light. Right eye exhibits no discharge. Left eye exhibits no discharge. No scleral icterus.       Proptosis +  Neck: Normal range of motion. Neck supple. No JVD present. No tracheal deviation present. No thyromegaly present.  Cardiovascular: Normal rate, regular rhythm, normal heart sounds and intact distal pulses.  Exam reveals no gallop and no friction rub.   No murmur heard. Pulmonary/Chest: Effort normal and breath sounds normal. No respiratory distress. She has no wheezes. She has no rales. She exhibits no tenderness.  Abdominal: Soft. Bowel sounds are normal. She exhibits no distension and no mass. There is no tenderness. There is no rebound and no guarding.  Musculoskeletal: Normal range of motion. She exhibits no edema and no tenderness.  Lymphadenopathy:    She has no cervical adenopathy.  Neurological: She is alert and oriented to person, place, and time. She has normal reflexes. No cranial nerve deficit. She exhibits normal muscle tone. Coordination normal.  Skin: Skin is warm and dry. No rash noted. She is not diaphoretic. No erythema. No pallor.  Psychiatric: She has a normal mood and affect. Her behavior is normal. Judgment and thought content normal.          Assessment & Plan:

## 2012-02-28 NOTE — Patient Instructions (Signed)
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

## 2012-02-28 NOTE — Assessment & Plan Note (Signed)
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

## 2012-02-28 NOTE — Assessment & Plan Note (Addendum)
Discussed high likelihood of Vocal Cord Dysfunction and pathophysiolgy and Rx options  Plan  refer DR [place] of ENT  - refer speech therapy Mr Burnett Sheng - rov few months

## 2012-03-18 ENCOUNTER — Encounter (HOSPITAL_COMMUNITY): Payer: Self-pay

## 2012-03-18 ENCOUNTER — Emergency Department (HOSPITAL_COMMUNITY)
Admission: EM | Admit: 2012-03-18 | Discharge: 2012-03-18 | Disposition: A | Discharge status: Home / Self Care | Payer: Medicaid Other | Attending: Emergency Medicine | Admitting: Emergency Medicine

## 2012-03-18 DIAGNOSIS — Z79899 Other long term (current) drug therapy: Secondary | ICD-10-CM | POA: Insufficient documentation

## 2012-03-18 DIAGNOSIS — M25569 Pain in unspecified knee: Secondary | ICD-10-CM | POA: Insufficient documentation

## 2012-03-18 DIAGNOSIS — F411 Generalized anxiety disorder: Secondary | ICD-10-CM | POA: Insufficient documentation

## 2012-03-18 DIAGNOSIS — I10 Essential (primary) hypertension: Secondary | ICD-10-CM | POA: Insufficient documentation

## 2012-03-18 DIAGNOSIS — Z7982 Long term (current) use of aspirin: Secondary | ICD-10-CM | POA: Insufficient documentation

## 2012-03-18 DIAGNOSIS — F319 Bipolar disorder, unspecified: Secondary | ICD-10-CM | POA: Insufficient documentation

## 2012-03-18 DIAGNOSIS — M1990 Unspecified osteoarthritis, unspecified site: Secondary | ICD-10-CM | POA: Insufficient documentation

## 2012-03-18 DIAGNOSIS — M5430 Sciatica, unspecified side: Secondary | ICD-10-CM

## 2012-03-18 MED ORDER — IBUPROFEN 200 MG PO TABS
400.0000 mg | ORAL_TABLET | Freq: Once | ORAL | Status: AC
  Administered 2012-03-18: 400 mg via ORAL
  Filled 2012-03-18: qty 2

## 2012-03-18 MED ORDER — DIAZEPAM 5 MG PO TABS: 5.0000 mg | ORAL_TABLET | Freq: Three times a day (TID) | ORAL | Status: AC | PRN

## 2012-03-18 MED ORDER — NAPROXEN 500 MG PO TABS: 500.0000 mg | ORAL_TABLET | Freq: Two times a day (BID) | ORAL | Status: DC | PRN

## 2012-03-18 MED ORDER — DIAZEPAM 5 MG PO TABS
5.0000 mg | ORAL_TABLET | Freq: Once | ORAL | Status: AC
  Administered 2012-03-18: 5 mg via ORAL
  Filled 2012-03-18: qty 1

## 2012-03-18 MED ORDER — OXYCODONE-ACETAMINOPHEN 5-325 MG PO TABS: 1.0000 | ORAL_TABLET | ORAL | Status: AC | PRN

## 2012-03-18 MED ORDER — HYDROMORPHONE HCL PF 1 MG/ML IJ SOLN
1.0000 mg | Freq: Once | INTRAMUSCULAR | Status: AC
  Administered 2012-03-18: 1 mg via INTRAMUSCULAR
  Filled 2012-03-18: qty 1

## 2012-03-18 NOTE — ED Notes (Signed)
Patient states chronic right knee pain with surgery and history of sciatica.  Onset 10 days ago pain right knee while bending 8-10/10 achy sharp and then right lower buttocks pain radiating to right lower extremity thight 8-10/10 achy sharp skin warm bilateral steady gait.  Pedal pulses +2 bilateral.

## 2012-03-18 NOTE — ED Provider Notes (Signed)
History  This chart was scribed for Katie Razor, MD by Bennett Scrape. This patient was seen in room STRE6/STRE6 and the patient's care was started at 11:29AM.  CSN: [id number]  Arrival date & time 03/18/12  4098   First MD Initiated Contact with Patient 03/18/12 1129      Chief Complaint  Patient presents with  . Sciatica     The history is provided by the patient. No language interpreter was used.    Katie [name] is a 46 y.o. female who presents to the Emergency Department complaining of gradual onset, gradually worsening, constant right knee pain that started 10 days ago and sciatica pain that begins in her right buttock that radiates shooting pains down to her right ankle that started 4 days ago. The pain in her right knee is worse with bending. The sciatica pain is worse with rest and better with movement. She has a h/o sciatica and states that the pain is worse than normal. She also states that her normal sciatica pain stays in her right hip. She denies injury as the cause of the pain. She reports taking Motrin, Aleve and Percocet without improvement in her pain. She also has a h/o HTN, anxiety and bipolar disorder. She is an occasional alcohol user but denies smoking.   Previously seen [facility] for similar symptoms.  Past Medical History  Diagnosis Date  . OA (osteoarthritis)   . HTN (hypertension)   . Obesity   . Bipolar 1 disorder   . Depression   . Depression   . Back pain   . Anxiety   . Sciatica     Past Surgical History  Procedure Date  . Cholecystectomy 2007  . Cardiac catheterization 2006    normal  . Cesarean section 2002  . Right knee sur 2005  . Tonsillectomy     Family History  Problem Relation Age of Onset  . Heart disease Mother   . Diabetes Father   . Asthma Sister     also had rheumatic fever as a child, died age 42    History  Substance Use Topics  . Smoking status: Never Smoker   . Smokeless tobacco: Never Used   Comment:  second hand smoke  . Alcohol Use: Yes     occ     Review of Systems  Constitutional: Negative for fever and chills.  Respiratory: Negative for cough and shortness of breath.   Gastrointestinal: Negative for nausea and vomiting.  Musculoskeletal: Negative for back pain.       Right knee pain and right buttock pain associated with her h/o sciatica  Neurological: Negative for weakness and headaches.  All other systems reviewed and are negative.    Allergies  Percocet and Vicodin  Home Medications   Current Outpatient Rx  Name Route Sig Dispense Refill  . VITAMIN C PO Oral Take 2 tablets by mouth daily.      . ASPIRIN 81 MG PO TABS Oral Take 81 mg by mouth daily.    Marland Kitchen CALCIUM CARBONATE-VITAMIN D 600-400 MG-UNIT PO TABS Oral Take 1 tablet by mouth 2 (two) times daily with meals.      . DESVENLAFAXINE SUCCINATE ER 100 MG PO TB24 Oral Take 100 mg by mouth daily.      Marland Kitchen HYDROCHLOROTHIAZIDE 25 MG PO TABS  take 1 tablet by mouth once daily for blood pressure 90 tablet 1  . HYPROMELLOSE 2.5 % OP SOLN Both Eyes Place 2 drops into both eyes 4 (four)  times daily as needed. For dry eyes    . LAMOTRIGINE 200 MG PO TABS Oral Take 200 mg by mouth every morning.      Marland Kitchen NORTRIPTYLINE HCL 25 MG PO CAPS  take 1 capsule by mouth every evening 31 capsule 2  . OMEPRAZOLE 20 MG PO CPDR Oral Take 1 capsule (20 mg total) by mouth daily. 30 capsule 3  . ONDANSETRON HCL 4 MG PO TABS Oral Take 4 mg by mouth every 6 (six) hours. For  nauseau    . OXYCODONE-ACETAMINOPHEN 5-325 MG PO TABS Oral Take 1 tablet by mouth every 4 (four) hours as needed. For pain     . PHENTERMINE HCL 37.5 MG PO CAPS Oral Take 37.5 mg by mouth every morning.      Marland Kitchen POTASSIUM PO Oral Take 1 tablet by mouth daily. Over the counter SAMS potassium.      Triage Vitals: BP 136/69  Pulse 55  Temp(Src) 98.2 F (36.8 C) (Oral)  Resp 20  SpO2 99%  Physical Exam  Nursing note and vitals reviewed. Constitutional: She is oriented to  person, place, and time. She appears well-developed and well-nourished. No distress.  HENT:  Head: Normocephalic and atraumatic.  Eyes: EOM are normal.  Neck: Neck supple. No tracheal deviation present.  Cardiovascular: Normal rate, regular rhythm and normal heart sounds.   Pulmonary/Chest: Effort normal and breath sounds normal. No respiratory distress.  Musculoskeletal: Normal range of motion. She exhibits tenderness.       No midline spinal tenderness, left buttock and right thigh tenderness posteriorly, left knee is symmetric to the right knee, no sign of trauma, no effusion of the left knee, no increased warmth or erythema to the left knee,  neurovascularly intact distally  Neurological: She is alert and oriented to person, place, and time.  Skin: Skin is warm and dry.  Psychiatric: She has a normal mood and affect. Her behavior is normal.    ED Course  Procedures (including critical care time)  DIAGNOSTIC STUDIES: Oxygen Saturation is 99% on room air, normal by my interpretation.    COORDINATION OF CARE: 12:23PM-Discussed discharge plan which includes pt making an appointment with her PCP to follow up on her symptoms and pain medications with pt and pt agreed.  Labs Reviewed - No data to display No results found.   1. Sciatica   2. Knee pain       MDM  45yF with atraumatic knee and back pain. No evidence of neurovascular compromise. Plan symptomatic tx and outpt fu. Return precautions discussed.      I personally preformed the services scribed in my presence. The recorded information has been reviewed and considered. Katie Razor, MD.    Katie Razor, MD 03/21/12 [phone]

## 2012-03-18 NOTE — ED Notes (Signed)
Pt complains of right side hip and ankle and leg pain, hx of sciatica

## 2012-03-18 NOTE — Discharge Instructions (Signed)
Arthralgia Your caregiver has diagnosed you as suffering from an arthralgia. Arthralgia means there is pain in a joint. This can come from many reasons including:  Bruising the joint which causes soreness (inflammation) in the joint.   Wear and tear on the joints which occur as we grow older (osteoarthritis).   Overusing the joint.   Various forms of arthritis.   Infections of the joint.  Regardless of the cause of pain in your joint, most of these different pains respond to anti-inflammatory drugs and rest. The exception to this is when a joint is infected, and these cases are treated with antibiotics, if it is a bacterial infection. HOME CARE INSTRUCTIONS   Rest the injured area for as long as directed by your caregiver. Then slowly start using the joint as directed by your caregiver and as the pain allows. Crutches as directed may be useful if the ankles, knees or hips are involved. If the knee was splinted or casted, continue use and care as directed. If an stretchy or elastic wrapping bandage has been applied today, it should be removed and re-applied every 3 to 4 hours. It should not be applied tightly, but firmly enough to keep swelling down. Watch toes and feet for swelling, bluish discoloration, coldness, numbness or excessive pain. If any of these problems (symptoms) occur, remove the ace bandage and re-apply more loosely. If these symptoms persist, contact your caregiver or return to this location.   For the first 24 hours, keep the injured extremity elevated on pillows while lying down.   Apply ice for 15 to 20 minutes to the sore joint every couple hours while awake for the first half day. Then 3 to 4 times per day for the first 48 hours. Put the ice in a plastic bag and place a towel between the bag of ice and your skin.   Wear any splinting, casting, elastic bandage applications, or slings as instructed.   Only take over-the-counter or prescription medicines for pain,  discomfort, or fever as directed by your caregiver. Do not use aspirin immediately after the injury unless instructed by your physician. Aspirin can cause increased bleeding and bruising of the tissues.   If you were given crutches, continue to use them as instructed and do not resume weight bearing on the sore joint until instructed.  Persistent pain and inability to use the sore joint as directed for more than 2 to 3 days are warning signs indicating that you should see a caregiver for a follow-up visit as soon as possible. Initially, a hairline fracture (break in bone) may not be evident on X-rays. Persistent pain and swelling indicate that further evaluation, non-weight bearing or use of the joint (use of crutches or slings as instructed), or further X-rays are indicated. X-rays may sometimes not show a small fracture until a week or 10 days later. Make a follow-up appointment with your own caregiver or one to whom we have referred you. A radiologist (specialist in reading X-rays) may read your X-rays. Make sure you know how you are to obtain your X-ray results. Do not assume everything is normal if you do not hear from us. SEEK MEDICAL CARE IF: Bruising, swelling, or pain increases. SEEK IMMEDIATE MEDICAL CARE IF:   Your fingers or toes are numb or blue.   The pain is not responding to medications and continues to stay the same or get worse.   The pain in your joint becomes severe.   You develop a fever over   102 F (38.9 C).   It becomes impossible to move or use the joint.  MAKE SURE YOU:   Understand these instructions.   Will watch your condition.   Will get help right away if you are not doing well or get worse.  Document Released: 10/23/2005 Document Revised: 10/12/2011 Document Reviewed: 06/10/2008 [facility] Patient Information 2012 [place], [place].Sciatica Sciatica is a condition often seen in patients with disk disease of the lower back. Pressure on the sciatica nerve causes  pain to radiate from the lower back or buttock down the leg.  CAUSES  It results from pressure on nerve roots coming out of the spine. This is often the result of a disc that deteriorates and pushes to one side. Often there is a history of back problems.  TREATMENT  In most cases sciatica improves greatly with conservative treatment (treatment that does not involve surgery). Most patients are completely better after 2-4 weeks of bed rest and other supportive care. Bed rest reduces the disc pressure greatly. Sitting is the worst position. When sitting pressure on the disc is over 5 times greater than it is while lying down. Avoid:  Bending.   Lifting.   All other activities which make the problem worse.  After the pain improves, you may continue with normal activity. Take brief periods for bed rest throughout the day until you are back to normal. Only take over-the-counter or prescription medicines for pain, discomfort, or fever as directed by your caregiver. Muscle relaxants may help by relieving spasm and providing mild sedation. Cold or heat therapy and massage may also give significant relief. Spinal manipulation is not recommended because it can increase the degree of disc protrusion. Traction can be used in severe cases. Surgery is reserved for patients who:  Do not improve within the first months of conservative treatment.   Have signs of severe nerve root pressure.  See your doctor for follow up care as recommended. A program for back injury rehabilitation with stretching and strengthening exercises is an important part of healing.  SEEK MEDICAL CARE IF:   You notice increased pain.   You notice weakness.   You have numbness in your legs.   You have any difficulty with bladder or bowel control.  Document Released: 11/30/2004 Document Revised: 10/12/2011 Document Reviewed: 10/23/2005 [facility] Patient Information 2012 [place], [place].Radicular Pain Radicular pain in either the arm  or leg is usually from a bulging or herniated disk in the spine. A piece of the herniated disk may press against the nerves as the nerves exit the spine. This causes pain which is felt at the tips of the nerves down the arm or leg. Other causes of radicular pain may include:  Fractures.   Heart disease.   Cancer.   An abnormal and usually degenerative state of the nervous system or nerves (neuropathy).  Diagnosis may require CT or MRI scanning to determine the primary cause.  Nerves that start at the neck (nerve roots) may cause radicular pain in the outer shoulder and arm. It can spread down to the thumb and fingers. The symptoms vary depending on which nerve root has been affected. In most cases radicular pain improves with conservative treatment. Neck problems may require physical therapy, a neck collar, or cervical traction. Treatment may take many weeks, and surgery may be considered if the symptoms do not improve.  Conservative treatment is also recommended for sciatica. Sciatica causes pain to radiate from the lower back or buttock area down the leg into  the foot. Often there is a history of back problems. Most patients with sciatica are better after 2 to 4 weeks of rest and other supportive care. Short term bed rest can reduce the disk pressure considerably. Sitting, however, is not a good position since this increases the pressure on the disk. You should avoid bending, lifting, and all other activities which make the problem worse. Traction can be used in severe cases. Surgery is usually reserved for patients who do not improve within the first months of treatment. Only take over-the-counter or prescription medicines for pain, discomfort, or fever as directed by your caregiver. Narcotics and muscle relaxants may help by relieving more severe pain and spasm and by providing mild sedation. Cold or massage can give significant relief. Spinal manipulation is not recommended. It can increase the  degree of disc protrusion. Epidural steroid injections are often effective treatment for radicular pain. These injections deliver medicine to the spinal nerve in the space between the protective covering of the spinal cord and back bones (vertebrae). Your caregiver can give you more information about steroid injections. These injections are most effective when given within two weeks of the onset of pain.  You should see your caregiver for follow up care as recommended. A program for neck and back injury rehabilitation with stretching and strengthening exercises is an important part of management.  SEEK IMMEDIATE MEDICAL CARE IF:  You develop increased pain, weakness, or numbness in your arm or leg.   You develop difficulty with bladder or bowel control.   You develop abdominal pain.  Document Released: 11/30/2004 Document Revised: 10/12/2011 Document Reviewed: 02/15/2009 [facility] Patient Information 2012 [place], [place].

## 2012-04-21 ENCOUNTER — Other Ambulatory Visit: Payer: Self-pay | Admitting: Family Medicine

## 2012-05-02 ENCOUNTER — Ambulatory Visit: Payer: Medicaid Other | Admitting: Internal Medicine

## 2012-05-05 ENCOUNTER — Encounter (HOSPITAL_COMMUNITY): Payer: Self-pay | Admitting: *Deleted

## 2012-05-05 ENCOUNTER — Emergency Department (HOSPITAL_COMMUNITY)
Admission: EM | Admit: 2012-05-05 | Discharge: 2012-05-05 | Disposition: A | Discharge status: Home / Self Care | Payer: Medicaid Other | Attending: Emergency Medicine | Admitting: Emergency Medicine

## 2012-05-05 ENCOUNTER — Emergency Department (HOSPITAL_COMMUNITY): Payer: Medicaid Other

## 2012-05-05 DIAGNOSIS — M25569 Pain in unspecified knee: Secondary | ICD-10-CM | POA: Insufficient documentation

## 2012-05-05 DIAGNOSIS — F319 Bipolar disorder, unspecified: Secondary | ICD-10-CM | POA: Insufficient documentation

## 2012-05-05 DIAGNOSIS — Z7982 Long term (current) use of aspirin: Secondary | ICD-10-CM | POA: Insufficient documentation

## 2012-05-05 DIAGNOSIS — Z79899 Other long term (current) drug therapy: Secondary | ICD-10-CM | POA: Insufficient documentation

## 2012-05-05 DIAGNOSIS — M25561 Pain in right knee: Secondary | ICD-10-CM

## 2012-05-05 DIAGNOSIS — X500XXA Overexertion from strenuous movement or load, initial encounter: Secondary | ICD-10-CM | POA: Insufficient documentation

## 2012-05-05 DIAGNOSIS — I10 Essential (primary) hypertension: Secondary | ICD-10-CM | POA: Insufficient documentation

## 2012-05-05 DIAGNOSIS — F411 Generalized anxiety disorder: Secondary | ICD-10-CM | POA: Insufficient documentation

## 2012-05-05 DIAGNOSIS — M1990 Unspecified osteoarthritis, unspecified site: Secondary | ICD-10-CM | POA: Insufficient documentation

## 2012-05-05 MED ORDER — OXYCODONE-ACETAMINOPHEN 5-325 MG PO TABS
1.0000 | ORAL_TABLET | Freq: Once | ORAL | Status: AC
  Administered 2012-05-05: 1 via ORAL
  Filled 2012-05-05: qty 1

## 2012-05-05 MED ORDER — OXYCODONE-ACETAMINOPHEN 5-325 MG PO TABS: 1.0000 | ORAL_TABLET | ORAL | Status: AC | PRN

## 2012-05-05 NOTE — ED Notes (Signed)
PA at bedside.

## 2012-05-05 NOTE — ED Notes (Signed)
Pt from home with reports of right knee pain and swelling for the last 3 months; however, pt reports falling on right knee hitting concrete yesterday after losing balance causing increased pain and swelling. Pt endorses having a MRI done about a month per Orthopedist and has appt to review results this week.

## 2012-05-05 NOTE — Discharge Instructions (Signed)
Read the information below.  Please see Dr Farris Has this week as planned.  You may continue to take ibuprofen as needed for pain.  Do not take additional tylenol while taking the percocet.  If you develop fevers, uncontrolled pain, weakness or numbness of your leg, or inability to walk, return to the ER for a recheck.  You may return to the ER at any time for worsening condition or any new symptoms that concern you.  Knee Pain The knee is the complex joint between your thigh and your lower leg. It is made up of bones, tendons, ligaments, and cartilage. The bones that make up the knee are:  The femur in the thigh.   The tibia and fibula in the lower leg.   The patella or kneecap riding in the groove on the lower femur.  CAUSES  Knee pain is a common complaint with many causes. A few of these causes are:  Injury, such as:   A ruptured ligament or tendon injury.   Torn cartilage.   Medical conditions, such as:   Gout   Arthritis   Infections   Overuse, over training or overdoing a physical activity.  Knee pain can be minor or severe. Knee pain can accompany debilitating injury. Minor knee problems often respond well to self-care measures or get well on their own. More serious injuries may need medical intervention or even surgery. SYMPTOMS The knee is complex. Symptoms of knee problems can vary widely. Some of the problems are:  Pain with movement and weight bearing.   Swelling and tenderness.   Buckling of the knee.   Inability to straighten or extend your knee.   Your knee locks and you cannot straighten it.   Warmth and redness with pain and fever.   Deformity or dislocation of the kneecap.  DIAGNOSIS  Determining what is wrong may be very straight forward such as when there is an injury. It can also be challenging because of the complexity of the knee. Tests to make a diagnosis may include:  Your caregiver taking a history and doing a physical exam.   Routine X-rays  can be used to rule out other problems. X-rays will not reveal a cartilage tear. Some injuries of the knee can be diagnosed by:   Arthroscopy a surgical technique by which a small video camera is inserted through tiny incisions on the sides of the knee. This procedure is used to examine and repair internal knee joint problems. Tiny instruments can be used during arthroscopy to repair the torn knee cartilage (meniscus).   Arthrography is a radiology technique. A contrast liquid is directly injected into the knee joint. Internal structures of the knee joint then become visible on X-ray film.   An MRI scan is a non x-ray radiology procedure in which magnetic fields and a computer produce two- or three-dimensional images of the inside of the knee. Cartilage tears are often visible using an MRI scanner. MRI scans have largely replaced arthrography in diagnosing cartilage tears of the knee.   Blood work.   Examination of the fluid that helps to lubricate the knee joint (synovial fluid). This is done by taking a sample out using a needle and a syringe.  TREATMENT The treatment of knee problems depends on the cause. Some of these treatments are:  Depending on the injury, proper casting, splinting, surgery or physical therapy care will be needed.   Give yourself adequate recovery time. Do not overuse your joints. If you begin to get  sore during workout routines, back off. Slow down or do fewer repetitions.   For repetitive activities such as cycling or running, maintain your strength and nutrition.   Alternate muscle groups. For example if you are a weight lifter, work the upper body on one day and the lower body the next.   Either tight or weak muscles do not give the proper support for your knee. Tight or weak muscles do not absorb the stress placed on the knee joint. Keep the muscles surrounding the knee strong.   Take care of mechanical problems.   If you have flat feet, orthotics or special  shoes may help. See your caregiver if you need help.   Arch supports, sometimes with wedges on the inner or outer aspect of the heel, can help. These can shift pressure away from the side of the knee most bothered by osteoarthritis.   A brace called an "unloader" brace also may be used to help ease the pressure on the most arthritic side of the knee.   If your caregiver has prescribed crutches, braces, wraps or ice, use as directed. The acronym for this is PRICE. This means protection, rest, ice, compression and elevation.   Nonsteroidal anti-inflammatory drugs (NSAID's), can help relieve pain. But if taken immediately after an injury, they may actually increase swelling. Take NSAID's with food in your stomach. Stop them if you develop stomach problems. Do not take these if you have a history of ulcers, stomach pain or bleeding from the bowel. Do not take without your caregiver's approval if you have problems with fluid retention, heart failure, or kidney problems.   For ongoing knee problems, physical therapy may be helpful.   Glucosamine and chondroitin are over-the-counter dietary supplements. Both may help relieve the pain of osteoarthritis in the knee. These medicines are different from the usual anti-inflammatory drugs. Glucosamine may decrease the rate of cartilage destruction.   Injections of a corticosteroid drug into your knee joint may help reduce the symptoms of an arthritis flare-up. They may provide pain relief that lasts a few months. You may have to wait a few months between injections. The injections do have a small increased risk of infection, water retention and elevated blood sugar levels.   Hyaluronic acid injected into damaged joints may ease pain and provide lubrication. These injections may work by reducing inflammation. A series of shots may give relief for as long as 6 months.   Topical painkillers. Applying certain ointments to your skin may help relieve the pain and  stiffness of osteoarthritis. Ask your pharmacist for suggestions. Many over the-counter products are approved for temporary relief of arthritis pain.   In some countries, doctors often prescribe topical NSAID's for relief of chronic conditions such as arthritis and tendinitis. A review of treatment with NSAID creams found that they worked as well as oral medications but without the serious side effects.  PREVENTION  Maintain a healthy weight. Extra pounds put more strain on your joints.   Get strong, stay limber. Weak muscles are a common cause of knee injuries. Stretching is important. Include flexibility exercises in your workouts.   Be smart about exercise. If you have osteoarthritis, chronic knee pain or recurring injuries, you may need to change the way you exercise. This does not mean you have to stop being active. If your knees ache after jogging or playing basketball, consider switching to swimming, water aerobics or other low-impact activities, at least for a few days a week. Sometimes limiting high-impact  activities will provide relief.   Make sure your shoes fit well. Choose footwear that is right for your sport.   Protect your knees. Use the proper gear for knee-sensitive activities. Use kneepads when playing volleyball or laying carpet. Buckle your seat belt every time you drive. Most shattered kneecaps occur in car accidents.   Rest when you are tired.  SEEK MEDICAL CARE IF:  You have knee pain that is continual and does not seem to be getting better.  SEEK IMMEDIATE MEDICAL CARE IF:  Your knee joint feels hot to the touch and you have a high fever. MAKE SURE YOU:   Understand these instructions.   Will watch your condition.   Will get help right away if you are not doing well or get worse.  Document Released: 08/20/2007 Document Revised: 10/12/2011 Document Reviewed: 08/20/2007 [facility] Patient Information 2012 [place], [place].  Degenerative Arthritis You have  osteoarthritis. This is the wear and tear arthritis that comes with aging. It is also called degenerative arthritis. This is common in people past middle age. It is caused by stress on the joints. The large weight bearing joints of the lower extremities are most often affected. The knees, hips, back, neck, and hands can become painful, swollen, and stiff. This is the most common type of arthritis. It comes on with age, carrying too much weight, or from an injury. Treatment includes resting the sore joint until the pain and swelling improve. Crutches or a walker may be needed for severe flares. Only take over-the-counter or prescription medicines for pain, discomfort, or fever as directed by your caregiver. Local heat therapy may improve motion. Cortisone shots into the joint are sometimes used to reduce pain and swelling during flares. Osteoarthritis is usually not crippling and progresses slowly. There are things you can do to decrease pain:  Avoid high impact activities.   Exercise regularly.   Low impact exercises such as walking, biking and swimming help to keep the muscles strong and keep normal joint function.   Stretching helps to keep your range of motion.   Lose weight if you are overweight. This reduces joint stress.  In severe cases when you have pain at rest or increasing disability, joint surgery may be helpful. See your caregiver for follow-up treatment as recommended.  SEEK IMMEDIATE MEDICAL CARE IF:   You have severe joint pain.   Marked swelling and redness in your joint develops.   You develop a high fever.  Document Released: 10/23/2005 Document Revised: 10/12/2011 Document Reviewed: 03/25/2007 [facility] Patient Information 2012 [place], [place].

## 2012-05-05 NOTE — ED Provider Notes (Signed)
History     CSN: [id number]  Arrival date & time 05/05/12  1253   First MD Initiated Contact with Patient 05/05/12 1335      Chief Complaint  Patient presents with  . Knee Pain    right    (Consider location/radiation/quality/duration/timing/severity/associated sxs/prior treatment) HPI Comments: Patient with chronic right knee pain with recent MRI, results pending.  Pt has appointment to see her orthopedist this week (Dr Remigio Eisenmenger at [facility]).  Pt reports she twisted her ankle and fell on her right knee yesterday, reports increased pain.  Pt has chronically not been able to fully flex knee, pain with any movement, pain with ambulation.  This is unchanged.    [name] presenting with knee pain.  Knee Pain Pertinent negatives include no chills, fever, numbness or weakness.    Past Medical History  Diagnosis Date  . OA (osteoarthritis)   . HTN (hypertension)   . Obesity   . Bipolar 1 disorder   . Depression   . Depression   . Back pain   . Anxiety   . Sciatica     Past Surgical History  Procedure Date  . Cholecystectomy 2007  . Cardiac catheterization 2006    normal  . Cesarean section 2002  . Right knee sur 2005  . Tonsillectomy     Family History  Problem Relation Age of Onset  . Heart disease Mother   . Diabetes Father   . Asthma Sister     also had rheumatic fever as a child, died age 25    History  Substance Use Topics  . Smoking status: Never Smoker   . Smokeless tobacco: Never Used   Comment: second hand smoke  . Alcohol Use: Yes     occ    OB History    Grav Para Term Preterm Abortions TAB SAB Ect Mult Living                  Review of Systems  Constitutional: Negative for fever and chills.  Cardiovascular: Negative for leg swelling.  Skin: Negative for wound.  Neurological: Negative for weakness and numbness.    Allergies  Percocet and Vicodin  Home Medications   Current Outpatient Rx  Name Route Sig  Dispense Refill  . ASPIRIN 81 MG PO TABS Oral Take 81 mg by mouth daily.    . DESVENLAFAXINE SUCCINATE ER 100 MG PO TB24 Oral Take 100 mg by mouth daily.      Marland Kitchen HYDROCHLOROTHIAZIDE 25 MG PO TABS  take 1 tablet by mouth once daily for blood pressure 90 tablet 1  . LAMOTRIGINE 200 MG PO TABS Oral Take 200 mg by mouth every morning.      Marland Kitchen MENTHOL (TOPICAL ANALGESIC) 5 % EX PADS Apply externally Apply 4 patches topically as needed. To knees    . NAPROXEN SODIUM 220 MG PO TABS Oral Take 220 mg by mouth 2 (two) times daily with a meal.    . NORTRIPTYLINE HCL 25 MG PO CAPS  take 1 capsule by mouth every evening 31 capsule 2  . OMEPRAZOLE 20 MG PO CPDR Oral Take 1 capsule (20 mg total) by mouth daily. 30 capsule 3  . ONDANSETRON HCL 4 MG PO TABS Oral Take 4 mg by mouth every 6 (six) hours. For  nausea    . PHENTERMINE HCL 37.5 MG PO CAPS Oral Take 37.5 mg by mouth every morning.        BP 101/55  Pulse 65  Temp 98.7 F (37.1 C) (Oral)  Resp 20  Wt 225 lb (102.059 kg)  SpO2 100%  LMP 04/23/2012  Physical Exam  Nursing note and vitals reviewed. Constitutional: She is oriented to person, place, and time. She appears well-developed and well-nourished. No distress.  HENT:  Head: Normocephalic and atraumatic.  Neck: Neck supple.  Pulmonary/Chest: Effort normal.  Musculoskeletal:       Right knee: She exhibits decreased range of motion and swelling. She exhibits no ecchymosis, no deformity, no laceration, no erythema, normal alignment, no LCL laxity and no MCL laxity.       Left knee: Normal.       Right ankle: Normal.       Diffuse tenderness over knee, pain with stress in any direction.  Distal pulses intact, sensation intact.    Neurological: She is alert and oriented to person, place, and time.  Skin: She is not diaphoretic.    ED Course  Procedures (including critical care time)  Labs Reviewed - No data to display Dg Knee Complete 4 Views Right  05/05/2012  *RADIOLOGY REPORT*   Clinical Data: Pain post fall  RIGHT KNEE - COMPLETE 4+ VIEW  Comparison:  08/19/2004  Findings: Marginal spurs about all three compartments of the knee. Negative for fracture, dislocation, or other acute bone injury. Normal alignment and mineralization. Small effusion in the suprapatellar bursa.  IMPRESSION: 1.  Tricompartmental degenerative changes without fracture or other acute abnormality.  Original Report Authenticated By: Thora Lance III, M.D.     1. Right knee pain   2. Osteoarthritis       MDM  Pt with chronic right knee pain, has a pending appt with orthopedics this week, fell on knee yesterday.  No acute injury noted by xray or exam.  Likely exacerbation of chronic pain.  Pt given percocet, knee sleeve, crutches, ortho follow up.  Pt denies any allergy to percocet, this has been her home medication in the past, included this in her list because she has an allergy to hydrocodone.  Has never had problems with percocet.  Return precautions given.  Patient verbalizes understanding and agrees with plan.          Dillard Cannon [place], [place] 05/05/12 [phone]

## 2012-05-05 NOTE — ED Provider Notes (Signed)
Medical screening examination/treatment/procedure(s) were performed by non-physician practitioner and as supervising physician I was immediately available for consultation/collaboration.  Doug Sou, MD 05/05/12 [phone]

## 2012-05-05 NOTE — ED Notes (Signed)
Right knee pain, fell on it yesterday, had knee surgery in 2005, c/o severe pain

## 2012-05-07 ENCOUNTER — Emergency Department (HOSPITAL_COMMUNITY): Payer: Medicaid Other

## 2012-05-07 ENCOUNTER — Emergency Department (HOSPITAL_COMMUNITY)
Admission: EM | Admit: 2012-05-07 | Discharge: 2012-05-08 | Disposition: A | Discharge status: Home / Self Care | Payer: Medicaid Other | Attending: Emergency Medicine | Admitting: Emergency Medicine

## 2012-05-07 ENCOUNTER — Encounter (HOSPITAL_COMMUNITY): Payer: Self-pay | Admitting: *Deleted

## 2012-05-07 DIAGNOSIS — I10 Essential (primary) hypertension: Secondary | ICD-10-CM | POA: Insufficient documentation

## 2012-05-07 DIAGNOSIS — M1990 Unspecified osteoarthritis, unspecified site: Secondary | ICD-10-CM | POA: Insufficient documentation

## 2012-05-07 DIAGNOSIS — F319 Bipolar disorder, unspecified: Secondary | ICD-10-CM | POA: Insufficient documentation

## 2012-05-07 DIAGNOSIS — E669 Obesity, unspecified: Secondary | ICD-10-CM | POA: Insufficient documentation

## 2012-05-07 DIAGNOSIS — R0789 Other chest pain: Secondary | ICD-10-CM | POA: Insufficient documentation

## 2012-05-07 LAB — HEPATIC FUNCTION PANEL
ALT: 8 U/L (ref 0–35)
AST: 13 U/L (ref 0–37)
Albumin: 3.5 g/dL (ref 3.5–5.2)
Alkaline Phosphatase: 73 U/L (ref 39–117)
Total Bilirubin: 0.2 mg/dL — ABNORMAL LOW (ref 0.3–1.2)

## 2012-05-07 LAB — POCT I-STAT, CHEM 8
Calcium, Ion: 1.17 mmol/L (ref 1.12–1.32)
Creatinine, Ser: 0.7 mg/dL (ref 0.50–1.10)
Hemoglobin: 15 g/dL (ref 12.0–15.0)
Sodium: 138 mEq/L (ref 135–145)
TCO2: 30 mmol/L (ref 0–100)

## 2012-05-07 LAB — CBC WITH DIFFERENTIAL/PLATELET
Basophils Absolute: 0 10*3/uL (ref 0.0–0.1)
Eosinophils Absolute: 0.1 10*3/uL (ref 0.0–0.7)
Eosinophils Relative: 1 % (ref 0–5)
HCT: 41.1 % (ref 36.0–46.0)
Lymphs Abs: 3.9 10*3/uL (ref 0.7–4.0)
MCH: 31.5 pg (ref 26.0–34.0)
MCV: 92.6 fL (ref 78.0–100.0)
Monocytes Absolute: 0.8 10*3/uL (ref 0.1–1.0)
Platelets: 343 10*3/uL (ref 150–400)
RDW: 13.6 % (ref 11.5–15.5)

## 2012-05-07 LAB — POCT I-STAT TROPONIN I

## 2012-05-07 MED ORDER — NITROGLYCERIN 0.4 MG SL SUBL
SUBLINGUAL_TABLET | SUBLINGUAL | Status: AC
  Administered 2012-05-07: 0.4 mg via SUBLINGUAL
  Filled 2012-05-07: qty 75

## 2012-05-07 MED ORDER — NITROGLYCERIN 0.4 MG SL SUBL
0.4000 mg | SUBLINGUAL_TABLET | SUBLINGUAL | Status: DC | PRN
  Administered 2012-05-07 (×2): 0.4 mg via SUBLINGUAL

## 2012-05-07 NOTE — ED Provider Notes (Signed)
History     CSN: [id number]  Arrival date & time 05/07/12  2053   First MD Initiated Contact with Patient 05/07/12 2309      Chief Complaint  Patient presents with  . Chest Pain    (Consider location/radiation/quality/duration/timing/severity/associated sxs/prior treatment) HPI Complains of chest pain anterior rating her back onset 2 days ago initially intermittent lasting a few minutes at a time accompanied by lightheadedness pleuritic in nature pain has been constant since 10 AM today. She treated herself with Percocet at home with no relief. Also took Aleve at home without relief received 3 sublingual nitroglycerin here administered by the nurse with minimal relief no associated nausea or sweatiness. Presently discomfort is moderate. Patient thought she may been having a panic attack. Past Medical History  Diagnosis Date  . OA (osteoarthritis)   . HTN (hypertension)   . Obesity   . Bipolar 1 disorder   . Depression   . Depression   . Back pain   . Anxiety   . Sciatica     Past Surgical History  Procedure Date  . Cholecystectomy 2007  . Cardiac catheterization 2006    normal  . Cesarean section 2002  . Right knee sur 2005  . Tonsillectomy     Family History  Problem Relation Age of Onset  . Heart disease Mother   . Diabetes Father   . Asthma Sister     also had rheumatic fever as a child, died age 6   family history Sr. died in her 30s after having had open-heart surgery as result of rheumatic heart disease. Patient has 10 other siblings without heart disease mother died in her 31s of heart disease father without history of heart disease  History  Substance Use Topics  . Smoking status: Never Smoker   . Smokeless tobacco: Never Used   Comment: second hand smoke  . Alcohol Use: Yes     occ   Social history positive marijuana use occasional alcohol use no other tobacco products OB History    Grav Para Term Preterm Abortions TAB SAB Ect Mult Living        Review of Systems  HENT: Negative.   Respiratory: Positive for shortness of breath.   Cardiovascular: Positive for chest pain.  Gastrointestinal: Negative.   Musculoskeletal: Negative.   Skin: Negative.   Neurological: Positive for weakness.  Hematological: Negative.   Psychiatric/Behavioral: Negative.   All other systems reviewed and are negative.    Allergies  Percocet and Vicodin  Home Medications   Current Outpatient Rx  Name Route Sig Dispense Refill  . ASPIRIN 81 MG PO TABS Oral Take 81 mg by mouth daily.    . DESVENLAFAXINE SUCCINATE ER 100 MG PO TB24 Oral Take 100 mg by mouth daily.      Marland Kitchen HYDROCHLOROTHIAZIDE 25 MG PO TABS  take 1 tablet by mouth once daily for blood pressure 90 tablet 1  . LAMOTRIGINE 200 MG PO TABS Oral Take 200 mg by mouth every morning.      Marland Kitchen NAPROXEN SODIUM 220 MG PO TABS Oral Take 220 mg by mouth 2 (two) times daily with a meal.    . NORTRIPTYLINE HCL 25 MG PO CAPS  take 1 capsule by mouth every evening 31 capsule 2  . OMEPRAZOLE 20 MG PO CPDR Oral Take 1 capsule (20 mg total) by mouth daily. 30 capsule 3  . ONDANSETRON HCL 4 MG PO TABS Oral Take 4 mg by mouth every 6 (six) hours. For  nausea    . OXYCODONE-ACETAMINOPHEN 5-325 MG PO TABS Oral Take 1 tablet by mouth every 4 (four) hours as needed for pain. 15 tablet 0  . PHENTERMINE HCL 37.5 MG PO CAPS Oral Take 37.5 mg by mouth every morning.        BP 92/68  Pulse 60  Temp 97.9 F (36.6 C) (Oral)  Resp 20  SpO2 100%  LMP 04/23/2012  Physical Exam  Nursing note and vitals reviewed. Constitutional: She appears well-developed and well-nourished.       Mildly anxious  HENT:  Head: Normocephalic and atraumatic.  Eyes: Conjunctivae are normal. Pupils are equal, round, and reactive to light.  Neck: Neck supple. No tracheal deviation present. No thyromegaly present.  Cardiovascular: Normal rate and regular rhythm.   No murmur heard. Pulmonary/Chest: Effort normal and breath sounds  normal. She exhibits tenderness.       Chest wall exquisitely tender reproducing pain exactly  Abdominal: Soft. Bowel sounds are normal. She exhibits no distension. There is no tenderness.  Musculoskeletal: Normal range of motion. She exhibits no edema and no tenderness.  Neurological: She is alert. Coordination normal.  Skin: Skin is warm and dry. No rash noted.  Psychiatric: She has a normal mood and affect.    Date: 05/07/2012  Rate: 65  Rhythm: normal sinus rhythm  QRS Axis: left  Intervals: normal  ST/T Wave abnormalities: nonspecific T wave changes  Conduction Disutrbances:none  Narrative Interpretation:   Old EKG Reviewed: unchanged No significant change over 11/25/2011 interpreted by me  ED Course  Procedures (including critical care time)  Labs Reviewed  HEPATIC FUNCTION PANEL - Abnormal; Notable for the following:    Total Bilirubin 0.2 (*)     All other components within normal limits  CBC WITH DIFFERENTIAL - Abnormal; Notable for the following:    WBC 11.6 (*)     All other components within normal limits  POCT I-STAT, CHEM 8 - Abnormal; Notable for the following:    Potassium 3.3 (*)     Chloride 95 (*)     BUN 4 (*)     All other components within normal limits  POCT I-STAT TROPONIN I  D-DIMER, QUANTITATIVE   Dg Chest 2 View  05/07/2012  *RADIOLOGY REPORT*  Clinical Data: Chest pain, hypertension.  CHEST - 2 VIEW  Comparison: 11/24/2011  Findings: Heart and mediastinal contours are within normal limits. No focal opacities or effusions.  No acute bony abnormality.  IMPRESSION: No active cardiopulmonary disease.  Original Report Authenticated By: Cyndie Chime, M.D.     No diagnosis found.  cxr reviewed  By me Results for orders placed during the hospital encounter of 05/07/12  HEPATIC FUNCTION PANEL      Component Value Range   Total Protein 7.2  6.0 - 8.3 g/dL   Albumin 3.5  3.5 - 5.2 g/dL   AST 13  0 - 37 U/L   ALT 8  0 - 35 U/L   Alkaline  Phosphatase 73  39 - 117 U/L   Total Bilirubin 0.2 (*) 0.3 - 1.2 mg/dL   Bilirubin, Direct <4.0  0.0 - 0.3 mg/dL   Indirect Bilirubin NOT CALCULATED  0.3 - 0.9 mg/dL  CBC WITH DIFFERENTIAL      Component Value Range   WBC 11.6 (*) 4.0 - 10.5 K/uL   RBC 4.44  3.87 - 5.11 MIL/uL   Hemoglobin 14.0  12.0 - 15.0 g/dL   HCT 98.1  19.1 - 47.8 %  MCV 92.6  78.0 - 100.0 fL   MCH 31.5  26.0 - 34.0 pg   MCHC 34.1  30.0 - 36.0 g/dL   RDW 91.4  78.2 - 95.6 %   Platelets 343  150 - 400 K/uL   Neutrophils Relative 59  43 - 77 %   Neutro Abs 6.9  1.7 - 7.7 K/uL   Lymphocytes Relative 34  12 - 46 %   Lymphs Abs 3.9  0.7 - 4.0 K/uL   Monocytes Relative 6  3 - 12 %   Monocytes Absolute 0.8  0.1 - 1.0 K/uL   Eosinophils Relative 1  0 - 5 %   Eosinophils Absolute 0.1  0.0 - 0.7 K/uL   Basophils Relative 0  0 - 1 %   Basophils Absolute 0.0  0.0 - 0.1 K/uL  POCT I-STAT, CHEM 8      Component Value Range   Sodium 138  135 - 145 mEq/L   Potassium 3.3 (*) 3.5 - 5.1 mEq/L   Chloride 95 (*) 96 - 112 mEq/L   BUN 4 (*) 6 - 23 mg/dL   Creatinine, Ser 2.13  0.50 - 1.10 mg/dL   Glucose, Bld 76  70 - 99 mg/dL   Calcium, Ion 0.86  5.78 - 1.32 mmol/L   TCO2 30  0 - 100 mmol/L   Hemoglobin 15.0  12.0 - 15.0 g/dL   HCT 46.9  62.9 - 52.8 %  POCT I-STAT TROPONIN I      Component Value Range   Troponin i, poc 0.01  0.00 - 0.08 ng/mL   Comment 3           D-DIMER, QUANTITATIVE      Component Value Range   D-Dimer, Quant 0.40  0.00 - 0.48 ug/mL-FEU   Dg Chest 2 View  05/07/2012  *RADIOLOGY REPORT*  Clinical Data: Chest pain, hypertension.  CHEST - 2 VIEW  Comparison: 11/24/2011  Findings: Heart and mediastinal contours are within normal limits. No focal opacities or effusions.  No acute bony abnormality.  IMPRESSION: No active cardiopulmonary disease.  Original Report Authenticated By: Cyndie Chime, M.D.   Dg Knee Complete 4 Views Right  05/05/2012  *RADIOLOGY REPORT*  Clinical Data: Pain post fall  RIGHT  KNEE - COMPLETE 4+ VIEW  Comparison:  08/19/2004  Findings: Marginal spurs about all three compartments of the knee. Negative for fracture, dislocation, or other acute bone injury. Normal alignment and mineralization. Small effusion in the suprapatellar bursa.  IMPRESSION: 1.  Tricompartmental degenerative changes without fracture or other acute abnormality.  Original Report Authenticated By: Osa Craver, M.D.    MDM  Narcotic pain medicine not given in the ED as patient is driving home. Patient offered Tylenol which she declined  Strongly doubt acute coronary syndrome with highly atypical symptoms, non-acute EKG and negative cardiac markers in this young female. Pulmonary embolism since he ruled out low pretest clinical probability negative d-dimer. Exam is consistent with musculoskeletal chest pain. Plan patient to take Percocet as needed which he has she has a scheduled appointment later today with her primary care physician Diagnoses atypical chest pain      Doug Sou, MD 05/08/12 [id number]

## 2012-05-07 NOTE — ED Notes (Signed)
Placed on 2 L O2/[place]; reports 8/10 cp - substernal rad. Into mid back then to rgt scapular area; ccm showing SR rate 68 without ectopy; skin dark, warm, dry

## 2012-05-07 NOTE — ED Notes (Signed)
States pain currently 6.5/10 after NTG x3 SL

## 2012-05-07 NOTE — ED Notes (Addendum)
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 Has had a heart cath. C/o CP and sob. mentions dizziness and weakness. Describes as constant with intensity fluctuations. Alert, NAD, calm, interactive, speaking in clear complete sentences, taking deep breaths. Also some back pain.

## 2012-05-07 NOTE — ED Notes (Signed)
Pt. Reports chest pain since Sunday. Gradually worsening. 9/10 Pain radiated to mid back. Headache  X 3 days. Blurred vision. Tingling in finger tips bilaterally.  Reports hxt of hypertension. Reports SOB x 1 day. A.O. X 4. NAD.

## 2012-05-07 NOTE — ED Notes (Signed)
Dr.Pickering made aware of pt's current status (active CP despite NTG x3); no orders given at this time

## 2012-05-08 NOTE — ED Notes (Signed)
Resp. 18 not 26 as previously noted.

## 2012-06-02 ENCOUNTER — Other Ambulatory Visit: Payer: Self-pay | Admitting: Family Medicine

## 2012-06-27 ENCOUNTER — Ambulatory Visit: Payer: Medicaid Other | Admitting: Internal Medicine

## 2012-07-02 ENCOUNTER — Emergency Department (HOSPITAL_COMMUNITY)
Admission: EM | Admit: 2012-07-02 | Discharge: 2012-07-02 | Disposition: A | Discharge status: Home / Self Care | Payer: Medicaid Other | Attending: Emergency Medicine | Admitting: Emergency Medicine

## 2012-07-02 ENCOUNTER — Encounter (HOSPITAL_COMMUNITY): Payer: Self-pay | Admitting: *Deleted

## 2012-07-02 DIAGNOSIS — E669 Obesity, unspecified: Secondary | ICD-10-CM | POA: Insufficient documentation

## 2012-07-02 DIAGNOSIS — I10 Essential (primary) hypertension: Secondary | ICD-10-CM | POA: Insufficient documentation

## 2012-07-02 DIAGNOSIS — M1990 Unspecified osteoarthritis, unspecified site: Secondary | ICD-10-CM | POA: Insufficient documentation

## 2012-07-02 DIAGNOSIS — S0500XA Injury of conjunctiva and corneal abrasion without foreign body, unspecified eye, initial encounter: Secondary | ICD-10-CM

## 2012-07-02 DIAGNOSIS — F319 Bipolar disorder, unspecified: Secondary | ICD-10-CM | POA: Insufficient documentation

## 2012-07-02 DIAGNOSIS — S058X9A Other injuries of unspecified eye and orbit, initial encounter: Secondary | ICD-10-CM | POA: Insufficient documentation

## 2012-07-02 DIAGNOSIS — X58XXXA Exposure to other specified factors, initial encounter: Secondary | ICD-10-CM | POA: Insufficient documentation

## 2012-07-02 MED ORDER — TETRACAINE HCL 0.5 % OP SOLN
1.0000 [drp] | Freq: Once | OPHTHALMIC | Status: AC
  Administered 2012-07-02: 1 [drp] via OPHTHALMIC

## 2012-07-02 MED ORDER — NAPHAZOLINE-PHENIRAMINE 0.025-0.3 % OP SOLN: 1.0000 [drp] | OPHTHALMIC | Status: AC | PRN

## 2012-07-02 MED ORDER — FLUORESCEIN SODIUM 1 MG OP STRP
1.0000 | ORAL_STRIP | Freq: Once | OPHTHALMIC | Status: AC
  Administered 2012-07-02: 1 via OPHTHALMIC

## 2012-07-02 MED ORDER — TOBRAMYCIN 0.3 % OP SOLN
1.0000 [drp] | Freq: Four times a day (QID) | OPHTHALMIC | Status: DC
  Administered 2012-07-02: 1 [drp] via OPHTHALMIC
  Filled 2012-07-02: qty 5

## 2012-07-02 NOTE — ED Notes (Signed)
Pt w/ rt eye irritation - pt w/ progressively worse light sensitivity - pt states she took her her contact out d/t irritation, has had continued irritation even after contact removal and multiple attempts to rinse her eye out. Pt now w/ HA as well.

## 2012-07-02 NOTE — ED Notes (Signed)
Pt reports trying to take contact out last night and thinks she may have scratched her eye

## 2012-07-02 NOTE — ED Provider Notes (Signed)
History     CSN: [id number]  Arrival date & time 07/02/12  1818   First MD Initiated Contact with Patient 07/02/12 1949      Chief Complaint  Patient presents with  . Eye Pain    (Consider location/radiation/quality/duration/timing/severity/associated sxs/prior treatment) HPI Comments: Pt is a contact lens wearer w FB sensation, photophobia, excessive clear eye tearing, & blurred vision. Onset of symptoms began yesterday.   [name] presenting with eye pain. The history is provided by the patient.  Eye Pain This is a new problem. The current episode started yesterday. The problem occurs constantly. The problem has been gradually worsening. Associated symptoms include a visual change. Pertinent negatives include no abdominal pain, chest pain, chills, congestion, diaphoresis, fatigue, fever, headaches, nausea, numbness, swollen glands or weakness. Nothing aggravates the symptoms. She has tried nothing for the symptoms.    Past Medical History  Diagnosis Date  . OA (osteoarthritis)   . HTN (hypertension)   . Obesity   . Bipolar 1 disorder   . Depression   . Depression   . Back pain   . Anxiety   . Sciatica     Past Surgical History  Procedure Date  . Cholecystectomy 2007  . Cardiac catheterization 2006    normal  . Cesarean section 2002  . Right knee sur 2005  . Tonsillectomy     Family History  Problem Relation Age of Onset  . Heart disease Mother   . Diabetes Father   . Asthma Sister     also had rheumatic fever as a child, died age 45    History  Substance Use Topics  . Smoking status: Never Smoker   . Smokeless tobacco: Never Used   Comment: second hand smoke  . Alcohol Use: Yes     occ    OB History    Grav Para Term Preterm Abortions TAB SAB Ect Mult Living                  Review of Systems  Constitutional: Negative for fever, chills, diaphoresis, appetite change and fatigue.  HENT: Negative for congestion.   Eyes: Positive  for photophobia, pain, redness and itching. Negative for visual disturbance.  Respiratory: Negative for shortness of breath.   Cardiovascular: Negative for chest pain and leg swelling.  Gastrointestinal: Negative for nausea and abdominal pain.  Genitourinary: Negative for dysuria, urgency and frequency.  Neurological: Negative for dizziness, syncope, weakness, light-headedness, numbness and headaches.  Psychiatric/Behavioral: Negative for confusion.  All other systems reviewed and are negative.    Allergies  Percocet and Vicodin  Home Medications   Current Outpatient Rx  Name Route Sig Dispense Refill  . ASPIRIN 81 MG PO TABS Oral Take 81 mg by mouth daily.    . DESVENLAFAXINE SUCCINATE ER 100 MG PO TB24 Oral Take 100 mg by mouth daily.      Marland Kitchen HYDROCHLOROTHIAZIDE 25 MG PO TABS  take 1 tablet by mouth once daily for blood pressure 90 tablet 1  . LAMOTRIGINE 200 MG PO TABS Oral Take 200 mg by mouth every morning.      Marland Kitchen NAPROXEN SODIUM 220 MG PO TABS Oral Take 220 mg by mouth 2 (two) times daily with a meal.    . NORTRIPTYLINE HCL 25 MG PO CAPS  take 1 capsule by mouth every evening 31 capsule 2  . OMEPRAZOLE 20 MG PO CPDR Oral Take 1 capsule (20 mg total) by mouth daily. 30 capsule 3  .  ONDANSETRON HCL 4 MG PO TABS Oral Take 4 mg by mouth every 6 (six) hours. For  nausea    . PHENTERMINE HCL 37.5 MG PO CAPS Oral Take 37.5 mg by mouth every morning.        BP 116/77  Pulse 70  Temp 98.5 F (36.9 C) (Oral)  Resp 16  SpO2 98%  LMP 06/19/2012  Physical Exam  Nursing note and vitals reviewed. Constitutional: She is oriented to person, place, and time. She appears well-developed and well-nourished. No distress.  HENT:  Head: Normocephalic and atraumatic.  Eyes: Conjunctivae and EOM are normal. Pupils are equal, round, and reactive to light. No foreign body present in the left eye.       No tenderness to palpation over temporal arteries or orbital region. Pain free EOMs, visual  acuity equal bilaterally, no increase in IOPs, no proptosis, lid swelling, hyphema, purulent discharge from eyes, or consensual photophobia.  Eyelids everted, no evidence of FB.  Fluorescein study: tiny corneal uptake, No dendritic pattern.  Neck: Normal range of motion. Neck supple.  Pulmonary/Chest: Effort normal.  Neurological: She is alert and oriented to person, place, and time.  Skin: Skin is warm and dry. No rash noted. She is not diaphoretic.  Psychiatric: Her behavior is normal.    ED Course  Procedures (including critical care time)  Labs Reviewed - No data to display No results found.   No diagnosis found.    MDM  Corneal abrasion  Pt with corneal abrasion on PE.  Tdap given. Eye irrigated w NS, no evidence of FB.  No change in vision, acuity equal bilaterally.  Pt is a contact lens wearer.  Exam non-concerning for orbital cellulitis, hyphema, or large corneal ulcers. Patient will be discharged home with tobramycin.   Patient understands to follow up with ophthalmology, & to return to ER if new symptoms develop including change in vision, purulent drainage, or entrapment.          Jaci Carrel, [place] 07/02/12 2033

## 2012-07-02 NOTE — ED Notes (Signed)
Pt reports rt eye pain/irritation that began yesterday after removing her contacts - contacts had only been in approx 1.5 days. Pt states she has attempted to rinse her eye out multiple times last night, today has had increased light sensitive, pain, and HA.

## 2012-07-02 NOTE — ED Notes (Signed)
Rx given x1 D/c instructions reviewed w/ pt - pt denies any further questions or concerns at present.  Pt ambulating independently w/ steady gait on d/c in no acute distress, A&Ox4.  

## 2012-07-03 NOTE — ED Provider Notes (Signed)
Medical screening examination/treatment/procedure(s) were performed by non-physician practitioner and as supervising physician I was immediately available for consultation/collaboration.  Greer Wainright R. Renie Stelmach, MD 07/03/12 0008 

## 2012-07-10 ENCOUNTER — Other Ambulatory Visit: Payer: Self-pay | Admitting: Family Medicine

## 2012-08-08 ENCOUNTER — Emergency Department (HOSPITAL_COMMUNITY): Payer: Medicaid Other

## 2012-08-08 ENCOUNTER — Encounter (HOSPITAL_COMMUNITY): Payer: Self-pay | Admitting: Emergency Medicine

## 2012-08-08 ENCOUNTER — Emergency Department (HOSPITAL_COMMUNITY)
Admission: EM | Admit: 2012-08-08 | Discharge: 2012-08-08 | Disposition: A | Discharge status: Home / Self Care | Payer: Medicaid Other | Attending: Emergency Medicine | Admitting: Emergency Medicine

## 2012-08-08 DIAGNOSIS — R300 Dysuria: Secondary | ICD-10-CM | POA: Insufficient documentation

## 2012-08-08 DIAGNOSIS — Z79899 Other long term (current) drug therapy: Secondary | ICD-10-CM | POA: Insufficient documentation

## 2012-08-08 DIAGNOSIS — R3915 Urgency of urination: Secondary | ICD-10-CM | POA: Insufficient documentation

## 2012-08-08 DIAGNOSIS — M545 Low back pain, unspecified: Secondary | ICD-10-CM | POA: Insufficient documentation

## 2012-08-08 DIAGNOSIS — M1990 Unspecified osteoarthritis, unspecified site: Secondary | ICD-10-CM | POA: Insufficient documentation

## 2012-08-08 DIAGNOSIS — I10 Essential (primary) hypertension: Secondary | ICD-10-CM | POA: Insufficient documentation

## 2012-08-08 DIAGNOSIS — R109 Unspecified abdominal pain: Secondary | ICD-10-CM | POA: Insufficient documentation

## 2012-08-08 DIAGNOSIS — F411 Generalized anxiety disorder: Secondary | ICD-10-CM | POA: Insufficient documentation

## 2012-08-08 DIAGNOSIS — F3130 Bipolar disorder, current episode depressed, mild or moderate severity, unspecified: Secondary | ICD-10-CM | POA: Insufficient documentation

## 2012-08-08 LAB — CBC WITH DIFFERENTIAL/PLATELET
Basophils Absolute: 0 10*3/uL (ref 0.0–0.1)
Basophils Relative: 0 % (ref 0–1)
Eosinophils Relative: 1 % (ref 0–5)
HCT: 36.7 % (ref 36.0–46.0)
MCHC: 34.3 g/dL (ref 30.0–36.0)
MCV: 92.7 fL (ref 78.0–100.0)
Monocytes Absolute: 0.9 10*3/uL (ref 0.1–1.0)
RDW: 13.6 % (ref 11.5–15.5)

## 2012-08-08 LAB — URINALYSIS, ROUTINE W REFLEX MICROSCOPIC
Bilirubin Urine: NEGATIVE
Glucose, UA: NEGATIVE mg/dL
Hgb urine dipstick: NEGATIVE
Specific Gravity, Urine: 1.023 (ref 1.005–1.030)
Urobilinogen, UA: 1 mg/dL (ref 0.0–1.0)

## 2012-08-08 LAB — BASIC METABOLIC PANEL
BUN: 9 mg/dL (ref 6–23)
CO2: 30 mEq/L (ref 19–32)
Chloride: 96 mEq/L (ref 96–112)
Creatinine, Ser: 0.64 mg/dL (ref 0.50–1.10)

## 2012-08-08 LAB — POCT PREGNANCY, URINE: Preg Test, Ur: NEGATIVE

## 2012-08-08 MED ORDER — SODIUM CHLORIDE 0.9 % IV SOLN
INTRAVENOUS | Status: DC
  Administered 2012-08-08: 21:00:00 via INTRAVENOUS

## 2012-08-08 MED ORDER — MORPHINE SULFATE 4 MG/ML IJ SOLN
4.0000 mg | Freq: Once | INTRAMUSCULAR | Status: AC
  Administered 2012-08-08: 4 mg via INTRAVENOUS
  Filled 2012-08-08: qty 1

## 2012-08-08 MED ORDER — IOHEXOL 300 MG/ML  SOLN
100.0000 mL | Freq: Once | INTRAMUSCULAR | Status: AC | PRN
  Administered 2012-08-08: 100 mL via INTRAVENOUS

## 2012-08-08 MED ORDER — HYDROMORPHONE HCL PF 1 MG/ML IJ SOLN
1.0000 mg | Freq: Once | INTRAMUSCULAR | Status: AC
  Administered 2012-08-08: 1 mg via INTRAVENOUS
  Filled 2012-08-08: qty 1

## 2012-08-08 MED ORDER — GI COCKTAIL ~~LOC~~
30.0000 mL | Freq: Once | ORAL | Status: AC
  Administered 2012-08-08: 30 mL via ORAL
  Filled 2012-08-08: qty 30

## 2012-08-08 MED ORDER — TRAMADOL HCL 50 MG PO TABS: 50.0000 mg | ORAL_TABLET | Freq: Four times a day (QID) | ORAL | Status: DC | PRN

## 2012-08-08 NOTE — ED Notes (Signed)
Pt c/o bilateral flank pain x 2-3 days; worsening in intensity; c/o urinary frequency and urgency; denies blood to urine; has been waking up to go to bathroom x 2 days; pt reports this is not her normal.

## 2012-08-08 NOTE — ED Provider Notes (Signed)
History     CSN: [id number]  Arrival date & time 08/08/12  4098   First MD Initiated Contact with Patient 08/08/12 2042      Chief Complaint  Patient presents with  . Dysuria    (Consider location/radiation/quality/duration/timing/severity/associated sxs/prior treatment) The history is provided by the patient and medical records.   46 year old, female, presents to emergency department complaining of lower back pain.  That radiates around into her abdomen.  For the past 2 days.  She has not had trauma.  She denies nausea, vomiting, fevers, chills, diarrhea, or constipation.  She denies vaginal discharge or bleeding.  She denies dysuria.  She says that it stings.  At the end of her urination.  She also states that she has urgency of urination  Past Medical History  Diagnosis Date  . OA (osteoarthritis)   . HTN (hypertension)   . Obesity   . Bipolar 1 disorder   . Depression   . Depression   . Back pain   . Anxiety   . Sciatica     Past Surgical History  Procedure Date  . Cholecystectomy 2007  . Cardiac catheterization 2006    normal  . Cesarean section 2002  . Right knee sur 2005  . Tonsillectomy     Family History  Problem Relation Age of Onset  . Heart disease Mother   . Diabetes Father   . Asthma Sister     also had rheumatic fever as a child, died age 41    History  Substance Use Topics  . Smoking status: Never Smoker   . Smokeless tobacco: Never Used   Comment: second hand smoke  . Alcohol Use: Yes     occ    OB History    Grav Para Term Preterm Abortions TAB SAB Ect Mult Living                  Review of Systems  Constitutional: Negative for fever, chills and diaphoresis.  Respiratory: Negative for cough and shortness of breath.   Cardiovascular: Negative for chest pain.  Gastrointestinal: Positive for abdominal pain. Negative for nausea, vomiting, diarrhea and constipation.  Genitourinary: Positive for urgency. Negative for dysuria, frequency,  vaginal bleeding, vaginal discharge and vaginal pain.  Musculoskeletal: Positive for back pain.  Skin: Negative for rash.  Neurological: Negative for headaches.  Hematological: Does not bruise/bleed easily.  Psychiatric/Behavioral: Negative for confusion.  All other systems reviewed and are negative.    Allergies  Percocet and Vicodin  Home Medications   Current Outpatient Rx  Name Route Sig Dispense Refill  . ALPRAZOLAM 0.5 MG PO TABS Oral Take 0.5 mg by mouth at bedtime.    . CELECOXIB 200 MG PO CAPS Oral Take 200 mg by mouth 2 (two) times daily.    . DESVENLAFAXINE SUCCINATE ER 100 MG PO TB24 Oral Take 100 mg by mouth daily.      . DESVENLAFAXINE SUCCINATE ER 100 MG PO TB24 Oral Take 100 mg by mouth daily.    Marland Kitchen LAMOTRIGINE 200 MG PO TABS Oral Take 200 mg by mouth every morning.     Marland Kitchen NORTRIPTYLINE HCL 25 MG PO CAPS  take 1 capsule by mouth every evening 31 capsule 2  . OMEPRAZOLE 20 MG PO CPDR Oral Take 1 capsule (20 mg total) by mouth daily. 30 capsule 3  . PHENTERMINE HCL 37.5 MG PO CAPS Oral Take 37.5 mg by mouth every morning.        BP 112/67  Pulse 76  Temp 98.9 F (37.2 C) (Oral)  Resp 20  SpO2 98%  LMP 07/24/2012  Physical Exam  Nursing note and vitals reviewed. Constitutional: She is oriented to person, place, and time. She appears well-developed and well-nourished. No distress.  HENT:  Head: Atraumatic.  Eyes: Conjunctivae normal and EOM are normal.  Neck: Normal range of motion. Neck supple.  Cardiovascular: Normal rate, regular rhythm and intact distal pulses.   No murmur heard. Pulmonary/Chest: Effort normal and breath sounds normal. No respiratory distress.  Abdominal: Soft. Bowel sounds are normal. She exhibits no distension. There is tenderness. There is no rebound and no guarding.       Left-sided abdominal tenderness, without peritoneal signs  Musculoskeletal: Normal range of motion. She exhibits no edema and no tenderness.  Neurological: She is  alert and oriented to person, place, and time. No cranial nerve deficit.  Skin: Skin is warm and dry.  Psychiatric: She has a normal mood and affect. Thought content normal.    ED Course  Procedures (including critical care time) pain, abdominal pain, and left-sided abdominal tenderness, and 46 year old, female.  She has no vaginal complaints.  She states that she has a stinging sensation at the end of her urination, but denies pain with urination.  With the tenderness on the left side of her abdomen and concern that she could have diverticulitis.  We will establish an IV perform laboratory testing, and a CAT scan for further evaluation  Labs Reviewed  URINALYSIS, ROUTINE W REFLEX MICROSCOPIC - Abnormal; Notable for the following:    APPearance CLOUDY (*)     All other components within normal limits  POCT PREGNANCY, URINE  BASIC METABOLIC PANEL  CBC WITH DIFFERENTIAL   No results found.   No diagnosis found.    MDM  Abdominal pain Chronic back pain        Cheri Guppy, MD 08/08/12 2304

## 2012-08-08 NOTE — ED Notes (Signed)
Pt presenting to ed with c/o dysuria and urinary frequency. Pt states it's feels like I have a uti and the pain radiates into my back. Pt denies nausea and vomiting. Pt states positive headache

## 2012-08-08 NOTE — ED Notes (Signed)
Discharge instructions reviewed. All questions answered. Rx reviewed.

## 2013-02-17 ENCOUNTER — Encounter (HOSPITAL_COMMUNITY): Payer: Self-pay | Admitting: Emergency Medicine

## 2013-02-17 ENCOUNTER — Emergency Department (HOSPITAL_COMMUNITY): Payer: Medicaid Other

## 2013-02-17 ENCOUNTER — Emergency Department (HOSPITAL_COMMUNITY)
Admission: EM | Admit: 2013-02-17 | Discharge: 2013-02-17 | Disposition: A | Discharge status: Home / Self Care | Payer: Medicaid Other | Attending: Emergency Medicine | Admitting: Emergency Medicine

## 2013-02-17 DIAGNOSIS — F411 Generalized anxiety disorder: Secondary | ICD-10-CM | POA: Insufficient documentation

## 2013-02-17 DIAGNOSIS — R0789 Other chest pain: Secondary | ICD-10-CM

## 2013-02-17 DIAGNOSIS — R51 Headache: Secondary | ICD-10-CM | POA: Insufficient documentation

## 2013-02-17 DIAGNOSIS — F319 Bipolar disorder, unspecified: Secondary | ICD-10-CM | POA: Insufficient documentation

## 2013-02-17 DIAGNOSIS — Z8739 Personal history of other diseases of the musculoskeletal system and connective tissue: Secondary | ICD-10-CM | POA: Insufficient documentation

## 2013-02-17 DIAGNOSIS — Z9861 Coronary angioplasty status: Secondary | ICD-10-CM | POA: Insufficient documentation

## 2013-02-17 DIAGNOSIS — R0602 Shortness of breath: Secondary | ICD-10-CM | POA: Insufficient documentation

## 2013-02-17 DIAGNOSIS — Z79899 Other long term (current) drug therapy: Secondary | ICD-10-CM | POA: Insufficient documentation

## 2013-02-17 DIAGNOSIS — I10 Essential (primary) hypertension: Secondary | ICD-10-CM | POA: Insufficient documentation

## 2013-02-17 DIAGNOSIS — E669 Obesity, unspecified: Secondary | ICD-10-CM | POA: Insufficient documentation

## 2013-02-17 LAB — BASIC METABOLIC PANEL WITH GFR
CO2: 28 meq/L (ref 19–32)
Calcium: 9.3 mg/dL (ref 8.4–10.5)
Chloride: 97 meq/L (ref 96–112)
Creatinine, Ser: 0.54 mg/dL (ref 0.50–1.10)
Glucose, Bld: 95 mg/dL (ref 70–99)

## 2013-02-17 LAB — BASIC METABOLIC PANEL
BUN: 6 mg/dL (ref 6–23)
GFR calc Af Amer: >90 mL/min (ref >90)
GFR calc non Af Amer: >90 mL/min (ref >90)
Potassium: 3.1 mEq/L — ABNORMAL LOW (ref 3.5–5.1)
Sodium: 136 mEq/L (ref 135–145)

## 2013-02-17 LAB — CBC
HCT: 38.3 % (ref 36.0–46.0)
Hemoglobin: 13.2 g/dL (ref 12.0–15.0)
MCH: 31.6 pg (ref 26.0–34.0)
MCHC: 34.5 g/dL (ref 30.0–36.0)
MCV: 91.6 fL (ref 78.0–100.0)
Platelets: 386 K/uL (ref 150–400)
RBC: 4.18 MIL/uL (ref 3.87–5.11)
RDW: 13.3 % (ref 11.5–15.5)
WBC: 9.7 K/uL (ref 4.0–10.5)

## 2013-02-17 LAB — D-DIMER, QUANTITATIVE: D-Dimer, Quant: 0.53 ug/mL-FEU — ABNORMAL HIGH (ref 0.00–0.48)

## 2013-02-17 LAB — POCT I-STAT TROPONIN I: Troponin i, poc: 0 ng/mL (ref 0.00–0.08)

## 2013-02-17 MED ORDER — LORAZEPAM 1 MG PO TABS: 0.5000 mg | ORAL_TABLET | Freq: Three times a day (TID) | ORAL | Status: DC | PRN

## 2013-02-17 MED ORDER — MORPHINE SULFATE 4 MG/ML IJ SOLN
2.0000 mg | Freq: Once | INTRAMUSCULAR | Status: AC
  Administered 2013-02-17: 2 mg via INTRAVENOUS
  Filled 2013-02-17: qty 1

## 2013-02-17 MED ORDER — NITROGLYCERIN 0.4 MG SL SUBL
0.4000 mg | SUBLINGUAL_TABLET | SUBLINGUAL | Status: DC | PRN
  Administered 2013-02-17: 0.4 mg via SUBLINGUAL
  Filled 2013-02-17: qty 25

## 2013-02-17 MED ORDER — IOHEXOL 350 MG/ML SOLN: 100.0000 mL | Freq: Once | INTRAVENOUS | Status: DC | PRN

## 2013-02-17 MED ORDER — LORAZEPAM 1 MG PO TABS
1.0000 mg | ORAL_TABLET | Freq: Once | ORAL | Status: AC
  Administered 2013-02-17: 1 mg via ORAL
  Filled 2013-02-17: qty 1

## 2013-02-17 MED ORDER — IOHEXOL 350 MG/ML SOLN
100.0000 mL | Freq: Once | INTRAVENOUS | Status: AC | PRN
  Administered 2013-02-17: 100 mL via INTRAVENOUS

## 2013-02-17 NOTE — ED Provider Notes (Signed)
History     CSN: [id number]  Arrival date & time 02/17/13  1250   First MD Initiated Contact with Patient 02/17/13 1603      Chief Complaint  Patient presents with  . Chest Pain    (Consider location/radiation/quality/duration/timing/severity/associated sxs/prior treatment) HPI  Patient is a 47 year old female past medical history significant for atypical chest pain, hypertension, anxiety, baseline shortness of breath/DOE presenting for central chest pain without radiation that began this morning. Patient describes pain as discomfort and tightness without alleviating or aggravating factors. Patient noted pain began when she was sitting down this morning. She has associated shortness of breath that was unresolving has her normal DOE typically does with rest.  Patient has previously been worked up by cardiologist with a negative cath, 2 negative stress tests. Patient has no chest pains at rest or with exertion. No recent travel, no estrogen use, no recent surgeries, no recent immobilizations. Denies fevers, chills, nausea, vomiting, diarrhea.  Past Medical History  Diagnosis Date  . OA (osteoarthritis)   . HTN (hypertension)   . Obesity   . Bipolar 1 disorder   . Depression   . Depression   . Back pain   . Anxiety   . Sciatica     Past Surgical History  Procedure Laterality Date  . Cholecystectomy  2007  . Cardiac catheterization  2006    normal  . Cesarean section  2002  . Right knee sur  2005  . Tonsillectomy      Family History  Problem Relation Age of Onset  . Heart disease Mother   . Diabetes Father   . Asthma Sister     also had rheumatic fever as a child, died age 73    History  Substance Use Topics  . Smoking status: Never Smoker   . Smokeless tobacco: Never Used     Comment: second hand smoke  . Alcohol Use: Yes     Comment: occ    OB History   Grav Para Term Preterm Abortions TAB SAB Ect Mult Living                  Review of Systems   Constitutional: Negative for fever and chills.  HENT: Negative for neck pain and neck stiffness.   Eyes: Negative for visual disturbance.  Respiratory: Positive for chest tightness and shortness of breath.   Cardiovascular: Positive for chest pain. Negative for palpitations and leg swelling.  Gastrointestinal: Negative for nausea, vomiting, abdominal pain, diarrhea and abdominal distention.  Genitourinary: Negative for dysuria.  Musculoskeletal: Negative for back pain.  Skin: Negative.   Neurological: Positive for headaches.  Psychiatric/Behavioral: The patient is nervous/anxious.     Allergies  Percocet and Vicodin  Home Medications   Current Outpatient Rx  Name  Route  Sig  Dispense  Refill  . ALPRAZolam (XANAX) 0.5 MG tablet   Oral   Take 0.5 mg by mouth 3 (three) times daily as needed for anxiety.          . celecoxib (CELEBREX) 200 MG capsule   Oral   Take 200 mg by mouth 2 (two) times daily.         Marland Kitchen desvenlafaxine (PRISTIQ) 100 MG 24 hr tablet   Oral   Take 100 mg by mouth every morning.          . hydrochlorothiazide (HYDRODIURIL) 25 MG tablet   Oral   Take 25 mg by mouth every morning.         Marland Kitchen  lamoTRIgine (LAMICTAL) 200 MG tablet   Oral   Take 200 mg by mouth every morning.          . nortriptyline (PAMELOR) 25 MG capsule   Oral   Take 25 mg by mouth at bedtime.         Marland Kitchen omeprazole (PRILOSEC) 20 MG capsule   Oral   Take 20 mg by mouth every morning.         . phentermine 37.5 MG capsule   Oral   Take 37.5 mg by mouth every morning.            BP 118/80  Pulse 74  Temp(Src) 97.6 F (36.4 C) (Oral)  Resp 16  SpO2 100%  LMP 02/12/2013  Physical Exam  Constitutional: She is oriented to person, place, and time. She appears well-developed and well-nourished. No distress.  HENT:  Head: Normocephalic and atraumatic.  Mouth/Throat: Oropharynx is clear and moist.  Eyes: Conjunctivae are normal. Pupils are equal, round, and  reactive to light.  Neck: Normal range of motion. Neck supple.  Cardiovascular: Normal rate, regular rhythm, normal heart sounds and intact distal pulses.  Exam reveals no gallop and no friction rub.   No murmur heard. Pulmonary/Chest: Effort normal and breath sounds normal. No accessory muscle usage. No respiratory distress. She has no decreased breath sounds. She has no wheezes. She has no rhonchi. She has no rales. She exhibits tenderness.  Abdominal: Soft. Bowel sounds are normal. There is no tenderness.  Musculoskeletal: Normal range of motion.  Lymphadenopathy:    She has no cervical adenopathy.  Neurological: She is alert and oriented to person, place, and time.  Skin: Skin is warm and dry. She is not diaphoretic.    ED Course  Procedures (including critical care time)  D-dimer was ordered in triage and resulted before I could cancel it. Patient was PERC negative, but with resulting elevated D-dimer it was necessary to use CT angio to r/o PE.  Patient's symptoms were improved with Ativan.   Labs Reviewed  BASIC METABOLIC PANEL - Abnormal; Notable for the following:    Potassium 3.1 (*)    All other components within normal limits  D-DIMER, QUANTITATIVE - Abnormal; Notable for the following:    D-Dimer, Quant 0.53 (*)    All other components within normal limits  CBC  POCT I-STAT TROPONIN I  POCT I-STAT TROPONIN I    Date: 02/17/2013  Rate: 67  Rhythm: normal sinus rhythm  QRS Axis: normal  Intervals: normal  ST/T Wave abnormalities: nonspecific ST/T changes  Conduction Disutrbances:none  Narrative Interpretation:   Old EKG Reviewed: changes noted    Ct Angio Chest Pe W/cm &/or Wo Cm  02/17/2013  *RADIOLOGY REPORT*  Clinical Data: Chest pain.  CT ANGIOGRAPHY CHEST  Technique:  Multidetector CT imaging of the chest using the standard protocol during bolus administration of intravenous contrast. Multiplanar reconstructed images including MIPs were obtained and reviewed  to evaluate the vascular anatomy.  Contrast: OMNIPAQUE IOHEXOL 350 MG/ML SOLN  Comparison: None.  Findings: The chest wall is unremarkable.  No obvious breast masses, supraclavicular or axillary lymphadenopathy.  The thyroid gland is normal.  The bony thorax is intact.  The heart is normal in size.  No pericardial effusion.  No mediastinal or hilar lymphadenopathy.  The esophagus is grossly normal.  The aorta is normal in caliber.  No dissection.  The pulmonary arterial tree is well opacified.  No filling defects are identified to suggest pulmonary emboli.  Examination of the lung parenchyma demonstrates no acute pulmonary findings.  No worrisome pulmonary masses or nodules.  No pleural effusion.  The tracheobronchial tree is unremarkable.  The upper abdomen is unremarkable.  IMPRESSION:  1.  No CT findings for pulmonary embolism. 2.  Normal thoracic aorta. 3.  No acute pulmonary findings.   Original Report Authenticated By: Rudie Meyer, M.D.    Dg Chest Portable 1 View  02/17/2013  *RADIOLOGY REPORT*  Clinical Data: Chest pain.  PORTABLE CHEST - 1 VIEW  Comparison: 72,013.  Findings: The cardiac silhouette, mediastinal and hilar contours are stable.  The lungs are clear.  No pleural effusion.  The bony thorax is intact.  IMPRESSION: No acute cardiopulmonary findings.   Original Report Authenticated By: Rudie Meyer, M.D.      1. Atypical chest pain       MDM  Patient is a 47 year old female presents to ED for chest pain that began this morning. Patient has been seen in ED for exact same presentation and symptoms 1 year ago. Cardiac exam remarkable for chest tenderness to palpation rest of exam benign. Cardiac and pulmonary workup were negative. Chest pain most likely not cardiac in etiology based on physical exam, laboratory, imaging findings. Patient's symptoms improved after receiving Ativan. Patient advised to follow up with PCP, also getting cardiology number for followup given her current  chest pain episodes. Patient agreeable to plan. Patient case discussed with Dr. Oletta Lamas, agrees with plan. Patient stable at time of discharge.      Jeannetta Ellis, PA-C 02/18/13 0149

## 2013-02-17 NOTE — ED Notes (Signed)
Patient transported to CT 

## 2013-02-17 NOTE — ED Notes (Signed)
Patient c/o central chest pain since around 1030 this morning.  Patient also c/o shortness of breath and feeling like she's burning up.

## 2013-02-18 NOTE — ED Provider Notes (Signed)
Medical screening examination/treatment/procedure(s) were performed by non-physician practitioner and as supervising physician I was immediately available for consultation/collaboration.  Maisie Hauser Y. Marynell Bies, MD 02/18/13 0244 

## 2013-04-03 ENCOUNTER — Ambulatory Visit: Payer: Medicaid Other | Attending: Sports Medicine | Admitting: Physical Therapy

## 2013-04-03 DIAGNOSIS — IMO0001 Reserved for inherently not codable concepts without codable children: Secondary | ICD-10-CM | POA: Insufficient documentation

## 2013-04-03 DIAGNOSIS — M545 Low back pain, unspecified: Secondary | ICD-10-CM | POA: Insufficient documentation

## 2013-04-03 DIAGNOSIS — M5430 Sciatica, unspecified side: Secondary | ICD-10-CM | POA: Insufficient documentation

## 2013-04-14 ENCOUNTER — Ambulatory Visit: Payer: Medicaid Other | Attending: Sports Medicine | Admitting: Physical Therapy

## 2013-04-14 DIAGNOSIS — M545 Low back pain, unspecified: Secondary | ICD-10-CM | POA: Insufficient documentation

## 2013-04-14 DIAGNOSIS — M5430 Sciatica, unspecified side: Secondary | ICD-10-CM | POA: Insufficient documentation

## 2013-04-14 DIAGNOSIS — IMO0001 Reserved for inherently not codable concepts without codable children: Secondary | ICD-10-CM | POA: Insufficient documentation

## 2013-04-21 ENCOUNTER — Ambulatory Visit: Payer: Medicaid Other | Admitting: Physical Therapy

## 2013-07-01 ENCOUNTER — Other Ambulatory Visit: Payer: Self-pay | Admitting: Internal Medicine

## 2013-07-01 DIAGNOSIS — Z1231 Encounter for screening mammogram for malignant neoplasm of breast: Secondary | ICD-10-CM

## 2013-07-22 ENCOUNTER — Ambulatory Visit
Admission: RE | Admit: 2013-07-22 | Discharge: 2013-07-22 | Disposition: A | Discharge status: Home / Self Care | Payer: Medicaid Other | Source: Ambulatory Visit | Attending: Internal Medicine | Admitting: Internal Medicine

## 2013-07-22 DIAGNOSIS — Z1231 Encounter for screening mammogram for malignant neoplasm of breast: Secondary | ICD-10-CM

## 2013-07-31 ENCOUNTER — Emergency Department (HOSPITAL_COMMUNITY): Payer: Medicaid Other

## 2013-07-31 ENCOUNTER — Encounter (HOSPITAL_COMMUNITY): Payer: Self-pay | Admitting: Emergency Medicine

## 2013-07-31 ENCOUNTER — Inpatient Hospital Stay (HOSPITAL_COMMUNITY)
Admission: EM | Admit: 2013-07-31 | Discharge: 2013-08-02 | DRG: 176 | Disposition: A | Discharge status: Home / Self Care | Payer: Medicaid Other | Attending: Internal Medicine | Admitting: Internal Medicine

## 2013-07-31 DIAGNOSIS — M5430 Sciatica, unspecified side: Secondary | ICD-10-CM | POA: Diagnosis present

## 2013-07-31 DIAGNOSIS — I2699 Other pulmonary embolism without acute cor pulmonale: Principal | ICD-10-CM | POA: Diagnosis present

## 2013-07-31 DIAGNOSIS — Z6841 Body Mass Index (BMI) 40.0 and over, adult: Secondary | ICD-10-CM

## 2013-07-31 DIAGNOSIS — Z79899 Other long term (current) drug therapy: Secondary | ICD-10-CM

## 2013-07-31 DIAGNOSIS — R0600 Dyspnea, unspecified: Secondary | ICD-10-CM

## 2013-07-31 DIAGNOSIS — F3130 Bipolar disorder, current episode depressed, mild or moderate severity, unspecified: Secondary | ICD-10-CM

## 2013-07-31 DIAGNOSIS — F319 Bipolar disorder, unspecified: Secondary | ICD-10-CM | POA: Diagnosis present

## 2013-07-31 DIAGNOSIS — F411 Generalized anxiety disorder: Secondary | ICD-10-CM | POA: Diagnosis present

## 2013-07-31 DIAGNOSIS — R079 Chest pain, unspecified: Secondary | ICD-10-CM | POA: Diagnosis present

## 2013-07-31 DIAGNOSIS — I10 Essential (primary) hypertension: Secondary | ICD-10-CM | POA: Diagnosis present

## 2013-07-31 LAB — COMPREHENSIVE METABOLIC PANEL
ALT: 5 U/L (ref 0–35)
Calcium: 8.7 mg/dL (ref 8.4–10.5)
Creatinine, Ser: 0.65 mg/dL (ref 0.50–1.10)
GFR calc Af Amer: >90 mL/min (ref >90)
Glucose, Bld: 87 mg/dL (ref 70–99)
Sodium: 137 mEq/L (ref 135–145)
Total Protein: 7.7 g/dL (ref 6.0–8.3)

## 2013-07-31 LAB — CBC WITH DIFFERENTIAL/PLATELET
Basophils Absolute: 0.1 10*3/uL (ref 0.0–0.1)
Eosinophils Absolute: 0.1 10*3/uL (ref 0.0–0.7)
Eosinophils Relative: 1 % (ref 0–5)
Lymphs Abs: 5.4 10*3/uL — ABNORMAL HIGH (ref 0.7–4.0)
MCH: 31.1 pg (ref 26.0–34.0)
MCV: 91.1 fL (ref 78.0–100.0)
Platelets: 337 10*3/uL (ref 150–400)
RDW: 13.6 % (ref 11.5–15.5)

## 2013-07-31 LAB — PROTIME-INR
INR: 0.95 (ref 0.00–1.49)
Prothrombin Time: 12.5 seconds (ref 11.6–15.2)

## 2013-07-31 LAB — POCT I-STAT TROPONIN I: Troponin i, poc: 0 ng/mL (ref 0.00–0.08)

## 2013-07-31 LAB — D-DIMER, QUANTITATIVE (NOT AT ARMC): D-Dimer, Quant: 0.77 ug/mL-FEU — ABNORMAL HIGH (ref 0.00–0.48)

## 2013-07-31 LAB — LIPASE, BLOOD: Lipase: 41 U/L (ref 11–59)

## 2013-07-31 MED ORDER — POTASSIUM CHLORIDE CRYS ER 20 MEQ PO TBCR
40.0000 meq | EXTENDED_RELEASE_TABLET | Freq: Once | ORAL | Status: AC
  Administered 2013-07-31: 40 meq via ORAL
  Filled 2013-07-31: qty 2

## 2013-07-31 MED ORDER — SODIUM CHLORIDE 0.9 % IJ SOLN: 3.0000 mL | Freq: Two times a day (BID) | INTRAMUSCULAR | Status: DC

## 2013-07-31 MED ORDER — ACETAMINOPHEN 650 MG RE SUPP: 650.0000 mg | Freq: Four times a day (QID) | RECTAL | Status: DC | PRN

## 2013-07-31 MED ORDER — HEPARIN (PORCINE) IN NACL 100-0.45 UNIT/ML-% IJ SOLN
1500.0000 [IU]/h | INTRAMUSCULAR | Status: DC
  Administered 2013-07-31: 22:00:00 1500 [IU]/h via INTRAVENOUS
  Filled 2013-07-31: qty 250

## 2013-07-31 MED ORDER — IOHEXOL 350 MG/ML SOLN
100.0000 mL | Freq: Once | INTRAVENOUS | Status: AC | PRN
  Administered 2013-07-31: 100 mL via INTRAVENOUS

## 2013-07-31 MED ORDER — LAMOTRIGINE 200 MG PO TABS
200.0000 mg | ORAL_TABLET | Freq: Every day | ORAL | Status: DC
  Administered 2013-08-01 – 2013-08-02 (×2): 200 mg via ORAL
  Filled 2013-07-31 (×2): qty 1

## 2013-07-31 MED ORDER — PANTOPRAZOLE SODIUM 40 MG PO TBEC
40.0000 mg | DELAYED_RELEASE_TABLET | Freq: Every day | ORAL | Status: DC
  Administered 2013-08-01 – 2013-08-02 (×2): 40 mg via ORAL
  Filled 2013-07-31 (×2): qty 1

## 2013-07-31 MED ORDER — KETOROLAC TROMETHAMINE 30 MG/ML IJ SOLN
30.0000 mg | Freq: Once | INTRAMUSCULAR | Status: AC
  Administered 2013-07-31: 30 mg via INTRAVENOUS
  Filled 2013-07-31: qty 1

## 2013-07-31 MED ORDER — DESVENLAFAXINE SUCCINATE ER 100 MG PO TB24: 100.0000 mg | ORAL_TABLET | Freq: Every morning | ORAL | Status: DC

## 2013-07-31 MED ORDER — FUROSEMIDE 40 MG PO TABS
40.0000 mg | ORAL_TABLET | Freq: Every day | ORAL | Status: DC
  Administered 2013-08-01 – 2013-08-02 (×2): 40 mg via ORAL
  Filled 2013-07-31 (×2): qty 1

## 2013-07-31 MED ORDER — HEPARIN BOLUS VIA INFUSION
5000.0000 [IU] | Freq: Once | INTRAVENOUS | Status: AC
  Administered 2013-07-31: 5000 [IU] via INTRAVENOUS
  Filled 2013-07-31: qty 5000

## 2013-07-31 MED ORDER — ALPRAZOLAM 0.5 MG PO TABS: 0.5000 mg | ORAL_TABLET | Freq: Three times a day (TID) | ORAL | Status: DC | PRN

## 2013-07-31 MED ORDER — ONDANSETRON HCL 4 MG PO TABS: 4.0000 mg | ORAL_TABLET | Freq: Four times a day (QID) | ORAL | Status: DC | PRN

## 2013-07-31 MED ORDER — SODIUM CHLORIDE 0.9 % IJ SOLN
3.0000 mL | Freq: Two times a day (BID) | INTRAMUSCULAR | Status: DC
  Administered 2013-07-31 – 2013-08-02 (×3): 3 mL via INTRAVENOUS

## 2013-07-31 MED ORDER — ONDANSETRON HCL 4 MG/2ML IJ SOLN: 4.0000 mg | Freq: Three times a day (TID) | INTRAMUSCULAR | Status: DC | PRN

## 2013-07-31 MED ORDER — GUAIFENESIN ER 600 MG PO TB12
1200.0000 mg | ORAL_TABLET | Freq: Two times a day (BID) | ORAL | Status: DC
  Administered 2013-07-31 – 2013-08-02 (×4): 1200 mg via ORAL
  Filled 2013-07-31 (×5): qty 2

## 2013-07-31 MED ORDER — ONDANSETRON HCL 4 MG/2ML IJ SOLN: 4.0000 mg | Freq: Four times a day (QID) | INTRAMUSCULAR | Status: DC | PRN

## 2013-07-31 MED ORDER — DIAZEPAM 5 MG/ML IJ SOLN
5.0000 mg | Freq: Once | INTRAMUSCULAR | Status: AC
  Administered 2013-07-31: 5 mg via INTRAVENOUS
  Filled 2013-07-31: qty 2

## 2013-07-31 MED ORDER — HYDROMORPHONE HCL PF 1 MG/ML IJ SOLN
1.0000 mg | INTRAMUSCULAR | Status: AC | PRN
  Administered 2013-07-31: 1 mg via INTRAVENOUS
  Filled 2013-07-31 (×2): qty 1

## 2013-07-31 MED ORDER — HYDROMORPHONE HCL PF 1 MG/ML IJ SOLN
1.0000 mg | Freq: Once | INTRAMUSCULAR | Status: AC
  Administered 2013-07-31: 1 mg via INTRAVENOUS
  Filled 2013-07-31: qty 1

## 2013-07-31 MED ORDER — SODIUM CHLORIDE 0.9 % IV SOLN
INTRAVENOUS | Status: AC
  Administered 2013-07-31: 21:00:00 via INTRAVENOUS

## 2013-07-31 MED ORDER — ACETAMINOPHEN 325 MG PO TABS: 650.0000 mg | ORAL_TABLET | Freq: Four times a day (QID) | ORAL | Status: DC | PRN

## 2013-07-31 MED ORDER — SODIUM CHLORIDE 0.9 % IV BOLUS (SEPSIS)
1000.0000 mL | Freq: Once | INTRAVENOUS | Status: AC
  Administered 2013-07-31: 1000 mL via INTRAVENOUS

## 2013-07-31 NOTE — H&P (Signed)
[facility] History and Physical  Katie [name] ZOX:[id number] DOB: [birth year] DOA: 07/31/2013  Referring physician: ER physician. PCP: Quitman Livings, MD Dr. Mikeal Hawthorne. Specialists: [facility].  Chief Complaint: Chest pain.  HPI: Katie [name] is a 47 y.o. female with history of hypertension and bipolar disorder presented to the ER because of chest pain. Patient started experiencing chest pain today around 2 PM. The pain was across the chest pleuritic in nature radiating to the back present even at rest. Over the last one week patient has been having nonproductive cough. Patient denies any shortness of breath nausea vomiting fever chills abdominal pain diarrhea. In the ER patient's troponins and EKG were unremarkable but d-dimer was high and a CT angiogram was done which shows right lower lobe pulmonary embolism. Patient has been hemodynamically stable but had required multiple doses of IV pain medications for a pleuritic type of chest pain. Patient denies having used any contraceptive pills and there is no family history of PE or DVT and has not had any recent surgery or travel. Patient states that she has been having chronic lower extremity edema and has been recently placed on Lasix over the last one month.  Review of Systems: As presented in the history of presenting illness, rest negative.  Past Medical History  Diagnosis Date  . OA (osteoarthritis)   . HTN (hypertension)   . Obesity   . Bipolar 1 disorder   . Depression   . Depression   . Back pain   . Anxiety   . Sciatica    Past Surgical History  Procedure Laterality Date  . Cholecystectomy  2007  . Cardiac catheterization  2006    normal  . Cesarean section  2002  . Right knee sur  2005  . Tonsillectomy     Social History:  reports that she has never smoked. She has never used smokeless tobacco. She reports that  drinks alcohol. She reports that she does not use illicit drugs. Home. where does patient live-- yes  Can patient participate in ADLs?  Allergies  Allergen Reactions  . Percocet [Oxycodone-Acetaminophen] Hives  . Vicodin [Hydrocodone-Acetaminophen] Hives    Hives & swelling    Family History  Problem Relation Age of Onset  . Heart disease Mother   . Diabetes Father   . Asthma Sister     also had rheumatic fever as a child, died age 72      Prior to Admission medications   Medication Sig Start Date End Date Taking? Authorizing Provider  ALPRAZolam Prudy Feeler) 0.5 MG tablet Take 0.5 mg by mouth 3 (three) times daily as needed for anxiety.    Yes Historical Provider, MD  celecoxib (CELEBREX) 200 MG capsule Take 200 mg by mouth 2 (two) times daily.   Yes Historical Provider, MD  desvenlafaxine (PRISTIQ) 100 MG 24 hr tablet Take 100 mg by mouth every morning.    Yes Historical Provider, MD  esomeprazole (NEXIUM) 40 MG capsule Take 40 mg by mouth daily before breakfast.   Yes Historical Provider, MD  furosemide (LASIX) 40 MG tablet Take 40 mg by mouth.   Yes Historical Provider, MD  guaiFENesin (MUCINEX) 600 MG 12 hr tablet Take 1,200 mg by mouth 2 (two) times daily.   Yes Historical Provider, MD  lamoTRIgine (LAMICTAL) 200 MG tablet Take 200 mg by mouth every morning.    Yes Historical Provider, MD  naproxen sodium (ANAPROX) 220 MG tablet Take 440 mg by mouth 2 (two) times daily as needed (for  pain).   Yes Historical Provider, MD   Physical Exam: Filed Vitals:   07/31/13 1614 07/31/13 1626 07/31/13 1855 07/31/13 2006  BP: 150/91 138/73  154/91  Pulse: 49 45  54  Temp:    97.6 F (36.4 C)  TempSrc:    Oral  Resp: 19 27  24   Height:   5\' 5"  (1.651 m) 5\' 5"  (1.651 m)  Weight:   115.667 kg (255 lb) 115.8 kg (255 lb 4.7 oz)  SpO2: 97% 100%  100%     General:  Well-developed and nourished.  Eyes: Anicteric no pallor.  ENT: No discharge from ears eyes nose mouth.  Neck: No mass felt.  Cardiovascular: S1-S2 heard.  Respiratory: No rhonchi or crepitations.  Abdomen: Soft  nontender bowel sounds present.  Skin: No rash.  Musculoskeletal: No edema.  Psychiatric: Appears normal.  Neurologic: Alert awake oriented to time place and person. Moves all extremities.  Labs on Admission:  Basic Metabolic Panel:  Recent Labs Lab 07/31/13 1635  NA 137  K 3.3*  CL 99  CO2 26  GLUCOSE 87  BUN 9  CREATININE 0.65  CALCIUM 8.7   Liver Function Tests:  Recent Labs Lab 07/31/13 1635  AST 14  ALT 5  ALKPHOS 81  BILITOT 0.2*  PROT 7.7  ALBUMIN 3.5    Recent Labs Lab 07/31/13 1635  LIPASE 41   No results found for this basename: AMMONIA,  in the last 168 hours CBC:  Recent Labs Lab 07/31/13 1635  WBC 11.7*  NEUTROABS 5.2  HGB 13.0  HCT 38.1  MCV 91.1  PLT 337   Cardiac Enzymes: No results found for this basename: CKTOTAL, CKMB, CKMBINDEX, TROPONINI,  in the last 168 hours  BNP (last 3 results) No results found for this basename: PROBNP,  in the last 8760 hours CBG: No results found for this basename: GLUCAP,  in the last 168 hours  Radiological Exams on Admission: Dg Chest 2 View  07/31/2013   *RADIOLOGY REPORT*  Clinical Data: Chest pain  CHEST - 2 VIEW  Comparison: February 17, 2013.  Findings: Cardiomediastinal silhouette appears normal.  No acute pulmonary disease is noted.  Bony thorax is intact.  IMPRESSION: No acute cardiopulmonary abnormality seen.   Original Report Authenticated By: Lupita Raider.,  M.D.   Ct Angio Chest Pe W/cm &/or Wo Cm  07/31/2013   CLINICAL DATA:  Right-sided chest tightness, shortness of breath  EXAM: CT ANGIOGRAPHY CHEST WITH CONTRAST  TECHNIQUE: Multidetector CT imaging of the chest was performed using the standard protocol during bolus administration of intravenous contrast. Multiplanar CT image reconstructions including MIPs were obtained to evaluate the vascular anatomy.  CONTRAST:  OMNIPAQUE IOHEXOL 350 MG/ML SOLN  COMPARISON:  Chest radiograph 07/31/2013, chest CT 02/17/2013  FINDINGS: There is  small volume acute to subacute pulmonary arterial embolism in the right lower lobe, image 137 series 10. No CT evidence for right heart strain. Heart size is mildly enlarged. No pericardial or pleural effusion. Great vessels are normal in caliber. Trace superior pericardial recess fluid is identified. Surgical clips noted at the dome of the liver. A few patchy areas of subtle hypoperfusion are identified in the right lower lobe but no pulmonary parenchymal consolidation, nodule, or mass is identified. No acute osseous finding.  Review of the MIP images confirms the above findings.  IMPRESSION: Small volume acute/subacute pulmonary arterial embolism within the right lower lobe. No CT evidence for right heart strain. CriticalValue/emergent results were called by  telephone at the time of interpretation on 07/31/2013 at 6:03 PMto DAVID YAO , who verbally acknowledged these results.   Electronically Signed   By: Christiana Pellant M.D.   On: 07/31/2013 18:05    EKG: Independently reviewed. Sinus bradycardia beats 59 beats per minute.  Assessment/Plan Principal Problem:   Pulmonary embolism Active Problems:   Essential hypertension, benign   Chest pain   1. Pulmonary embolic since subacute to acute - presently hemodynamically stable. Unprovoked. Check Dopplers of the lower extremity. Eventually patient will need and hypercoagulable workup. Presently patient has been placed on heparin. If patient remains hemodynamically stable may change to oral anticoagulants. 2. Pleuritic type of chest pain probably from #1 - cycle cardiac markers. Continue pain relief medications. 3. Hypertension - presently only on Lasix. 4. Bipolar disorder - continue present medications.    Code Status: Full code.  Family Communication: None.  Disposition Plan: Admit to inpatient.    Arieon Scalzo N. [facility] Pager [phone].  If 7PM-7AM, please contact night-coverage [email or web address] Password [facility] 07/31/2013,  9:03 PM

## 2013-07-31 NOTE — ED Notes (Signed)
Attempted blood draw, unable to obtain blood. ED phlebotomist to attempt blood draw.

## 2013-07-31 NOTE — Progress Notes (Addendum)
ANTICOAGULATION CONSULT NOTE - Initial Consult  Pharmacy Consult for IV Heparin Indication: pulmonary embolus  Allergies  Allergen Reactions  . Percocet [Oxycodone-Acetaminophen] Hives  . Vicodin [Hydrocodone-Acetaminophen] Hives    Hives & swelling    Patient Measurements: Height: 5\' 5"  (165.1 cm) Weight: 255 lb (115.667 kg) IBW/kg (Calculated) : 57 Heparin Dosing Weight: 84.1kg  Vital Signs: BP: 138/73 mmHg (09/25 1626) Pulse Rate: 45 (09/25 1626)  Labs:  Recent Labs  07/31/13 1635  HGB 13.0  HCT 38.1  PLT 337  CREATININE 0.65    Estimated Creatinine Clearance: 114 ml/min (by C-G formula based on Cr of 0.65).   Medical History: Past Medical History  Diagnosis Date  . OA (osteoarthritis)   . HTN (hypertension)   . Obesity   . Bipolar 1 disorder   . Depression   . Depression   . Back pain   . Anxiety   . Sciatica     Medications:  Scheduled:  . potassium chloride SA  40 mEq Oral Once   Infusions:   PRN:   Assessment: 47 yo F with possible PE.  Goal of Therapy:  Heparin level 0.3-0.7 units/ml Monitor platelets by anticoagulation protocol: Yes   Plan:  Give Heparin 5000  units bolus x 1 Start heparin infusion at 1500 units/hr Continue to monitor H&H and platelets HL in 6 hours and daily HL.  Loletta Specter 07/31/2013,7:15 PM

## 2013-07-31 NOTE — ED Notes (Signed)
Pt states that she was sitting on her bed and started having chest tightness on the right side that radiates to right back.  Pt states that she having shob, nausea, dizziness, weakness, lightheadedness.  Pt states she does have a family history of cardiac disease.

## 2013-07-31 NOTE — ED Provider Notes (Signed)
CSN: [id number]     Arrival date & time 07/31/13  1556 History   First MD Initiated Contact with Patient 07/31/13 1618     Chief Complaint  Patient presents with  . Chest Pain   (Consider location/radiation/quality/duration/timing/severity/associated sxs/prior Treatment) The history is provided by the patient.  SANDRINE BLOODSWORTH is a 47 y.o. female hx of OA, HTN bipolar here with chest pain. Chest pain acute onset this afternoon while sitting. It was tightness and sharp and radiate to her back. She also felt short of breath as well. Denies history of PE or recent travel. She had similar episode 4 times this year and had negative workup so far. Last stress test was several years ago. She has no CAD but her mother had MI in 69s.    Past Medical History  Diagnosis Date  . OA (osteoarthritis)   . HTN (hypertension)   . Obesity   . Bipolar 1 disorder   . Depression   . Depression   . Back pain   . Anxiety   . Sciatica    Past Surgical History  Procedure Laterality Date  . Cholecystectomy  2007  . Cardiac catheterization  2006    normal  . Cesarean section  2002  . Right knee sur  2005  . Tonsillectomy     Family History  Problem Relation Age of Onset  . Heart disease Mother   . Diabetes Father   . Asthma Sister     also had rheumatic fever as a child, died age 43   History  Substance Use Topics  . Smoking status: Never Smoker   . Smokeless tobacco: Never Used     Comment: second hand smoke  . Alcohol Use: Yes     Comment: occ   OB History   Grav Para Term Preterm Abortions TAB SAB Ect Mult Living                 Review of Systems  Respiratory: Positive for shortness of breath.   Cardiovascular: Positive for chest pain.  All other systems reviewed and are negative.    Allergies  Percocet and Vicodin  Home Medications   Current Outpatient Rx  Name  Route  Sig  Dispense  Refill  . ALPRAZolam (XANAX) 0.5 MG tablet   Oral   Take 0.5 mg by mouth 3 (three) times  daily as needed for anxiety.          . celecoxib (CELEBREX) 200 MG capsule   Oral   Take 200 mg by mouth 2 (two) times daily.         Marland Kitchen desvenlafaxine (PRISTIQ) 100 MG 24 hr tablet   Oral   Take 100 mg by mouth every morning.          Marland Kitchen esomeprazole (NEXIUM) 40 MG capsule   Oral   Take 40 mg by mouth daily before breakfast.         . furosemide (LASIX) 40 MG tablet   Oral   Take 40 mg by mouth.         Marland Kitchen guaiFENesin (MUCINEX) 600 MG 12 hr tablet   Oral   Take 1,200 mg by mouth 2 (two) times daily.         Marland Kitchen lamoTRIgine (LAMICTAL) 200 MG tablet   Oral   Take 200 mg by mouth every morning.          . naproxen sodium (ANAPROX) 220 MG tablet   Oral   Take  440 mg by mouth 2 (two) times daily as needed (for pain).          BP 138/73  Pulse 45  Resp 27  Ht 5\' 5"  (1.651 m)  Wt 255 lb (115.667 kg)  BMI 42.43 kg/m2  SpO2 100%  LMP 07/24/2013 Physical Exam  Nursing note and vitals reviewed. Constitutional: She is oriented to person, place, and time. She appears well-developed and well-nourished.  HENT:  Head: Normocephalic.  Mouth/Throat: Oropharynx is clear and moist.  Eyes: Conjunctivae are normal. Pupils are equal, round, and reactive to light.  Neck: Normal range of motion. Neck supple.  Cardiovascular: Normal rate, regular rhythm and normal heart sounds.   Pulmonary/Chest: Effort normal and breath sounds normal. No respiratory distress. She has no wheezes. She has no rales.  + reproducible L sided chest tenderness   Abdominal: Soft. Bowel sounds are normal. She exhibits no distension.  Mild epigastric tenderness, no rebound. No RUQ tenderness   Musculoskeletal: Normal range of motion.  Neurological: She is alert and oriented to person, place, and time.  Skin: Skin is warm and dry.  Psychiatric: She has a normal mood and affect. Her behavior is normal. Judgment and thought content normal.    ED Course  Procedures (including critical care  time)  CRITICAL CARE Performed by: Silverio Lay, Ellean Firman   Total critical care time: 30 min   Critical care time was exclusive of separately billable procedures and treating other patients.  Critical care was necessary to treat or prevent imminent or life-threatening deterioration.  Critical care was time spent personally by me on the following activities: development of treatment plan with patient and/or surrogate as well as nursing, discussions with consultants, evaluation of patient's response to treatment, examination of patient, obtaining history from patient or surrogate, ordering and performing treatments and interventions, ordering and review of laboratory studies, ordering and review of radiographic studies, pulse oximetry and re-evaluation of patient's condition.   Labs Review Labs Reviewed  CBC WITH DIFFERENTIAL - Abnormal; Notable for the following:    WBC 11.7 (*)    Lymphs Abs 5.4 (*)    All other components within normal limits  COMPREHENSIVE METABOLIC PANEL - Abnormal; Notable for the following:    Potassium 3.3 (*)    Total Bilirubin 0.2 (*)    All other components within normal limits  D-DIMER, QUANTITATIVE - Abnormal; Notable for the following:    D-Dimer, Quant 0.77 (*)    All other components within normal limits  LIPASE, BLOOD  PROTIME-INR  APTT  POCT I-STAT TROPONIN I    Date: 07/31/2013  Rate: 47  Rhythm: sinus bradycardia  QRS Axis: normal  Intervals: normal  ST/T Wave abnormalities: nonspecific ST changes  Conduction Disutrbances:none  Narrative Interpretation:   Old EKG Reviewed: unchanged    Imaging Review Dg Chest 2 View  07/31/2013   *RADIOLOGY REPORT*  Clinical Data: Chest pain  CHEST - 2 VIEW  Comparison: February 17, 2013.  Findings: Cardiomediastinal silhouette appears normal.  No acute pulmonary disease is noted.  Bony thorax is intact.  IMPRESSION: No acute cardiopulmonary abnormality seen.   Original Report Authenticated By: Lupita Raider.,  M.D.    Ct Angio Chest Pe W/cm &/or Wo Cm  07/31/2013   CLINICAL DATA:  Right-sided chest tightness, shortness of breath  EXAM: CT ANGIOGRAPHY CHEST WITH CONTRAST  TECHNIQUE: Multidetector CT imaging of the chest was performed using the standard protocol during bolus administration of intravenous contrast. Multiplanar CT image reconstructions including MIPs were  obtained to evaluate the vascular anatomy.  CONTRAST:  OMNIPAQUE IOHEXOL 350 MG/ML SOLN  COMPARISON:  Chest radiograph 07/31/2013, chest CT 02/17/2013  FINDINGS: There is small volume acute to subacute pulmonary arterial embolism in the right lower lobe, image 137 series 10. No CT evidence for right heart strain. Heart size is mildly enlarged. No pericardial or pleural effusion. Great vessels are normal in caliber. Trace superior pericardial recess fluid is identified. Surgical clips noted at the dome of the liver. A few patchy areas of subtle hypoperfusion are identified in the right lower lobe but no pulmonary parenchymal consolidation, nodule, or mass is identified. No acute osseous finding.  Review of the MIP images confirms the above findings.  IMPRESSION: Small volume acute/subacute pulmonary arterial embolism within the right lower lobe. No CT evidence for right heart strain. CriticalValue/emergent results were called by telephone at the time of interpretation on 07/31/2013 at 6:03 PMto Aaryan Essman , who verbally acknowledged these results.   Electronically Signed   By: Christiana Pellant M.D.   On: 07/31/2013 18:05    MDM  No diagnosis found. HADASSAH RANA is a 47 y.o. female here with CP, SOB. Will need to r/o ACS vs PE. Will get trop x 2. Will get d-dimer as well. I think likely MSK vs anxiety. Will reassess.   7:16 PM D-dimer elevated. CT showed acute on chronic CT. Patient hemodynamically stable and was not hypoxic. However, she still has pain despite being given toradol and dilaudid. Will heparinize and admit. Dr. Toniann Fail accepted patient  on tele.    Richardean Canal, MD 07/31/13 [phone]

## 2013-08-01 DIAGNOSIS — I2699 Other pulmonary embolism without acute cor pulmonale: Secondary | ICD-10-CM

## 2013-08-01 LAB — TROPONIN I
Troponin I: <0.3 ng/mL (ref <0.30)
Troponin I: <0.3 ng/mL (ref <0.30)

## 2013-08-01 LAB — CBC
HCT: 35.5 % — ABNORMAL LOW (ref 36.0–46.0)
Hemoglobin: 11.6 g/dL — ABNORMAL LOW (ref 12.0–15.0)
RDW: 13.9 % (ref 11.5–15.5)
WBC: 8.1 10*3/uL (ref 4.0–10.5)

## 2013-08-01 LAB — BASIC METABOLIC PANEL
BUN: 9 mg/dL (ref 6–23)
CO2: 28 mEq/L (ref 19–32)
Chloride: 103 mEq/L (ref 96–112)
Creatinine, Ser: 0.63 mg/dL (ref 0.50–1.10)
GFR calc Af Amer: >90 mL/min (ref >90)
GFR calc non Af Amer: >90 mL/min (ref >90)
Potassium: 3.5 mEq/L (ref 3.5–5.1)

## 2013-08-01 MED ORDER — RIVAROXABAN 15 MG PO TABS
15.0000 mg | ORAL_TABLET | ORAL | Status: AC
  Administered 2013-08-01 (×2): 15 mg via ORAL
  Filled 2013-08-01 (×2): qty 1

## 2013-08-01 MED ORDER — RIVAROXABAN 20 MG PO TABS: 20.0000 mg | ORAL_TABLET | Freq: Every day | ORAL | Status: DC

## 2013-08-01 MED ORDER — HEPARIN (PORCINE) IN NACL 100-0.45 UNIT/ML-% IJ SOLN
1350.0000 [IU]/h | INTRAMUSCULAR | Status: DC
  Filled 2013-08-01: qty 250

## 2013-08-01 MED ORDER — OXYCODONE HCL 5 MG PO TABS
5.0000 mg | ORAL_TABLET | ORAL | Status: DC | PRN
  Administered 2013-08-01 (×3): 5 mg via ORAL
  Filled 2013-08-01 (×3): qty 1

## 2013-08-01 MED ORDER — HYDROCODONE-ACETAMINOPHEN 5-325 MG PO TABS: 1.0000 | ORAL_TABLET | Freq: Four times a day (QID) | ORAL | Status: DC | PRN

## 2013-08-01 MED ORDER — RIVAROXABAN 15 MG PO TABS
15.0000 mg | ORAL_TABLET | Freq: Two times a day (BID) | ORAL | Status: DC
  Administered 2013-08-02: 15 mg via ORAL
  Filled 2013-08-01 (×3): qty 1

## 2013-08-01 NOTE — Progress Notes (Signed)
VASCULAR LAB PRELIMINARY  PRELIMINARY  PRELIMINARY  PRELIMINARY  Bilateral lower extremity venous duplex completed.    Preliminary report:  Bilateral:  No evidence of DVT, superficial thrombosis, or Baker's Cyst.   Katie [name], RVS 08/01/2013, 10:10 AM

## 2013-08-01 NOTE — Progress Notes (Signed)
Agreed with the previous nurse assessment, no new changes noted. Will continue to assess patient.

## 2013-08-01 NOTE — Progress Notes (Signed)
Rx Brief Anticoagulation note:  IV Heparin  Assessement:  1st HL = 0.95 after 5000 bolus and drip @ 1500 units/hr  No bleeding or IV problems per RN  Above goal  Plan:  Decrease Heparin drip to 1350 units/hr  Recheck HL in 6 hours  Lorenza Evangelist 08/01/2013 5:10 AM

## 2013-08-01 NOTE — Progress Notes (Signed)
[facility] PROGRESS NOTE  Katie [name] ZOX:[id number] DOB: 03-25-1966 DOA: 07/31/2013 PCP: Quitman Livings, MD  Assessment/Plan: 1. Pulmonary embolism: small -hemodynamically stable.  - no clear provocation for VTE, besides Obesity/relative immobilization and recent long car rides. -currently on IV heparin, transition to Xarelto if cost/copay affordable -Case manager consulted -dopplers negative  2. HTN -stable, continue Lasix.   3. Bipolar disorder - continue present medications -continue Pristiq and xanax PRN  4. Morbid obesity -life style modifications  Code Status: Full Family Communication: d/w fiance at bedside Disposition Plan: home when improved   HPI/Subjective: Some back and Chest discomfort  Objective: Filed Vitals:   08/01/13 0546  BP: 141/80  Pulse:   Temp: 97.5 F (36.4 C)  Resp: 22    Intake/Output Summary (Last 24 hours) at 08/01/13 1018 Last data filed at 08/01/13 0900  Gross per 24 hour  Intake 1270.75 ml  Output      0 ml  Net 1270.75 ml   Filed Weights   07/31/13 1855 07/31/13 2006  Weight: 115.667 kg (255 lb) 115.8 kg (255 lb 4.7 oz)    Exam:   General: AAOx3, morbidly obese  Cardiovascular: S1s2/RRR  Respiratory: CTAB  Abdomen: soft, Nt, BS present  Musculoskeletal: no edema c/c   Data Reviewed: Basic Metabolic Panel:  Recent Labs Lab 07/31/13 1635 08/01/13 0412  NA 137 138  K 3.3* 3.5  CL 99 103  CO2 26 28  GLUCOSE 87 92  BUN 9 9  CREATININE 0.65 0.63  CALCIUM 8.7 8.1*   Liver Function Tests:  Recent Labs Lab 07/31/13 1635  AST 14  ALT 5  ALKPHOS 81  BILITOT 0.2*  PROT 7.7  ALBUMIN 3.5    Recent Labs Lab 07/31/13 1635  LIPASE 41   No results found for this basename: AMMONIA,  in the last 168 hours CBC:  Recent Labs Lab 07/31/13 1635 08/01/13 0412  WBC 11.7* 8.1  NEUTROABS 5.2  --   HGB 13.0 11.6*  HCT 38.1 35.5*  MCV 91.1 92.9  PLT 337 280   Cardiac Enzymes:  Recent Labs Lab  07/31/13 2205 08/01/13 0412 08/01/13 0855  TROPONINI <0.30 <0.30 <0.30   BNP (last 3 results) No results found for this basename: PROBNP,  in the last 8760 hours CBG: No results found for this basename: GLUCAP,  in the last 168 hours  No results found for this or any previous visit (from the past 240 hour(s)).   Studies: Dg Chest 2 View  07/31/2013   *RADIOLOGY REPORT*  Clinical Data: Chest pain  CHEST - 2 VIEW  Comparison: February 17, 2013.  Findings: Cardiomediastinal silhouette appears normal.  No acute pulmonary disease is noted.  Bony thorax is intact.  IMPRESSION: No acute cardiopulmonary abnormality seen.   Original Report Authenticated By: Lupita Raider.,  M.D.   Ct Angio Chest Pe W/cm &/or Wo Cm  07/31/2013   CLINICAL DATA:  Right-sided chest tightness, shortness of breath  EXAM: CT ANGIOGRAPHY CHEST WITH CONTRAST  TECHNIQUE: Multidetector CT imaging of the chest was performed using the standard protocol during bolus administration of intravenous contrast. Multiplanar CT image reconstructions including MIPs were obtained to evaluate the vascular anatomy.  CONTRAST:  OMNIPAQUE IOHEXOL 350 MG/ML SOLN  COMPARISON:  Chest radiograph 07/31/2013, chest CT 02/17/2013  FINDINGS: There is small volume acute to subacute pulmonary arterial embolism in the right lower lobe, image 137 series 10. No CT evidence for right heart strain. Heart size is mildly enlarged. No pericardial  or pleural effusion. Great vessels are normal in caliber. Trace superior pericardial recess fluid is identified. Surgical clips noted at the dome of the liver. A few patchy areas of subtle hypoperfusion are identified in the right lower lobe but no pulmonary parenchymal consolidation, nodule, or mass is identified. No acute osseous finding.  Review of the MIP images confirms the above findings.  IMPRESSION: Small volume acute/subacute pulmonary arterial embolism within the right lower lobe. No CT evidence for right heart  strain. CriticalValue/emergent results were called by telephone at the time of interpretation on 07/31/2013 at 6:03 PMto DAVID YAO , who verbally acknowledged these results.   Electronically Signed   By: Christiana Pellant M.D.   On: 07/31/2013 18:05    Scheduled Meds: . desvenlafaxine  100 mg Oral q morning - 10a  . furosemide  40 mg Oral Daily  . guaiFENesin  1,200 mg Oral BID  . lamoTRIgine  200 mg Oral Daily  . pantoprazole  40 mg Oral Daily  . sodium chloride  3 mL Intravenous Q12H  . sodium chloride  3 mL Intravenous Q12H   Continuous Infusions: . heparin 1,350 Units/hr (08/01/13 0500)    Principal Problem:   Pulmonary embolism Active Problems:   Essential hypertension, benign   Chest pain    Time spent:    [facility]  [facility] Pager [phone]. If 7PM-7AM, please contact night-coverage at [email or web address], password [facility] 08/01/2013, 10:18 AM  LOS: 1 day

## 2013-08-01 NOTE — Progress Notes (Signed)
ANTICOAGULATION CONSULT NOTE - Initial Consult  Pharmacy Consult for Xarelto Indication: pulmonary embolus  Allergies  Allergen Reactions  . Percocet [Oxycodone-Acetaminophen] Hives  . Vicodin [Hydrocodone-Acetaminophen] Hives    Hives & swelling    Patient Measurements: Height: 5\' 5"  (165.1 cm) Weight: 255 lb 4.7 oz (115.8 kg) IBW/kg (Calculated) : 57 Heparin Dosing Weight: 84.1kg  Vital Signs: Temp: 97.5 F (36.4 C) (09/26 0546) Temp src: Oral (09/26 0546) BP: 141/80 mmHg (09/26 0546)  Labs:  Recent Labs  07/31/13 1635 07/31/13 1950 07/31/13 2205 08/01/13 0412 08/01/13 0855  HGB 13.0  --   --  11.6*  --   HCT 38.1  --   --  35.5*  --   PLT 337  --   --  280  --   APTT  --  26  --   --   --   LABPROT  --  12.5  --   --   --   INR  --  0.95  --   --   --   HEPARINUNFRC  --   --   --  0.95*  --   CREATININE 0.65  --   --  0.63  --   TROPONINI  --   --  <0.30 <0.30 <0.30    Estimated Creatinine Clearance: 114 ml/min (by C-G formula based on Cr of 0.63).   Medical History: Past Medical History  Diagnosis Date  . OA (osteoarthritis)   . HTN (hypertension)   . Obesity   . Bipolar 1 disorder   . Depression   . Depression   . Back pain   . Anxiety   . Sciatica     Medications:  Scheduled:  . desvenlafaxine  100 mg Oral q morning - 10a  . furosemide  40 mg Oral Daily  . guaiFENesin  1,200 mg Oral BID  . lamoTRIgine  200 mg Oral Daily  . pantoprazole  40 mg Oral Daily  . sodium chloride  3 mL Intravenous Q12H  . sodium chloride  3 mL Intravenous Q12H   Infusions:   PRN:   Assessment: 44 yoF with obesity admitted for PE.  Pt started on IV heparin and now to transition to xarelto. CrCl > 100 ml/min.  CBC ok.  Hemodynamically stable.    Goal of Therapy:   Monitor platelets by anticoagulation protocol: Yes   Plan:  1.  Xarelto 15mg  PO BID with food x 21 days, then Xarelto 20mg  po daily with food.   2.  Will complete xarelto teaching education this  afternoon.   Haynes Hoehn, PharmD 08/01/2013, 11:40 AM  Pager: [phone]

## 2013-08-01 NOTE — Progress Notes (Signed)
Pt has [place] Medicaid, Xarelto is 100% covered with a $3.00 co pay. Pt is aware and MD.

## 2013-08-02 LAB — RAPID URINE DRUG SCREEN, HOSP PERFORMED
Amphetamines: NOT DETECTED
Barbiturates: NOT DETECTED
Benzodiazepines: POSITIVE — AB
Cocaine: NOT DETECTED
Opiates: NOT DETECTED
Tetrahydrocannabinol: POSITIVE — AB

## 2013-08-02 LAB — CBC
HCT: 33.4 % — ABNORMAL LOW (ref 36.0–46.0)
MCH: 30.4 pg (ref 26.0–34.0)
MCHC: 32.6 g/dL (ref 30.0–36.0)
MCV: 93.3 fL (ref 78.0–100.0)
Platelets: 249 10*3/uL (ref 150–400)
RBC: 3.58 MIL/uL — ABNORMAL LOW (ref 3.87–5.11)
RDW: 13.8 % (ref 11.5–15.5)
WBC: 7.2 10*3/uL (ref 4.0–10.5)

## 2013-08-02 LAB — PREGNANCY, URINE: Preg Test, Ur: NEGATIVE

## 2013-08-02 MED ORDER — RIVAROXABAN 20 MG PO TABS: 20.0000 mg | ORAL_TABLET | Freq: Every day | ORAL | Status: DC

## 2013-08-02 MED ORDER — FUROSEMIDE 40 MG PO TABS: 40.0000 mg | ORAL_TABLET | Freq: Every day | ORAL | Status: DC

## 2013-08-02 MED ORDER — OXYCODONE HCL 5 MG PO TABS: 5.0000 mg | ORAL_TABLET | Freq: Four times a day (QID) | ORAL | Status: DC | PRN

## 2013-08-02 MED ORDER — RIVAROXABAN 15 MG PO TABS: 15.0000 mg | ORAL_TABLET | Freq: Two times a day (BID) | ORAL | Status: DC

## 2013-08-02 NOTE — Progress Notes (Signed)
Pt ambulated in hall. Oxygen sat remained at 98% on Room Air.  No complaints nor did she appear to get SOB.

## 2013-08-16 NOTE — Discharge Summary (Signed)
Physician Discharge Summary  Katie [name]:[id number] DOB: 10/08/1966 DOA: 07/31/2013  PCP: Quitman Livings, MD  Admit date: 07/31/2013 Discharge date: 08/02/2013  Time spent: 45 minutes  Recommendations for Outpatient Follow-up:  1. PCP in 1 week  Discharge Diagnoses:  Principal Problem:   Pulmonary embolism Active Problems:   Essential hypertension, benign   Chest pain   Obesity  Discharge Condition:stable  Diet recommendation: stable  Filed Weights   07/31/13 1855 07/31/13 2006  Weight: 115.667 kg (255 lb) 115.8 kg (255 lb 4.7 oz)    History of present illness:  Katie [name] is a 47 y.o. female with history of hypertension and bipolar disorder presented to the ER because of chest pain. Patient started experiencing chest pain today around 2 PM. The pain was across the chest pleuritic in nature radiating to the back present even at rest. Over the last one week patient has been having nonproductive cough. Patient denies any shortness of breath nausea vomiting fever chills abdominal pain diarrhea. In the ER patient's troponins and EKG were unremarkable but d-dimer was high and a CT angiogram was done which shows right lower lobe pulmonary embolism. Patient has been hemodynamically stable but had required multiple doses of IV pain medications for a pleuritic type of chest pain. Patient denies having used any contraceptive pills and there is no family history of PE or DVT and has not had any recent surgery or travel. Patient states that she has been having chronic lower extremity edema and has been recently placed on Lasix over the last one month.  Hospital Course:  1. Pulmonary embolism: small  -hemodynamically stable.  -no clear provocation for VTE, besides Obesity/relative immobilization and recent long car rides.  -she was started on IV heparin on admission and then transitioned to Xarelto after discussion patient, the case manager reviewed cost and this was felt to be affordable.   -dopplers negative for DVT  2. HTN  -stable, continue Lasix.   3. Bipolar disorder - continue present medications  -continue Pristiq and xanax PRN   4. Morbid obesity  -life style modifications    Discharge Exam: Filed Vitals:   08/02/13 0620  BP: 122/67  Pulse: 48  Temp: 97.8 F (36.6 C)  Resp: 21    General: AAOx3 Cardiovascular:S1S2/RRR Respiratory: CTAB  Discharge Instructions  Discharge Orders   Future Orders Complete By Expires   Diet - low sodium heart healthy  As directed    Discharge instructions  As directed    Comments:     DO not drive or operate machinery while taking pain medications   Increase activity slowly  As directed        Medication List    STOP taking these medications       celecoxib 200 MG capsule  Commonly known as:  CELEBREX     naproxen sodium 220 MG tablet  Commonly known as:  ANAPROX      TAKE these medications       ALPRAZolam 0.5 MG tablet  Commonly known as:  XANAX  Take 0.5 mg by mouth 3 (three) times daily as needed for anxiety.     desvenlafaxine 100 MG 24 hr tablet  Commonly known as:  PRISTIQ  Take 100 mg by mouth every morning.     esomeprazole 40 MG capsule  Commonly known as:  NEXIUM  Take 40 mg by mouth daily before breakfast.     furosemide 40 MG tablet  Commonly known as:  LASIX  Take 1  tablet (40 mg total) by mouth daily.     guaiFENesin 600 MG 12 hr tablet  Commonly known as:  MUCINEX  Take 1,200 mg by mouth 2 (two) times daily.     lamoTRIgine 200 MG tablet  Commonly known as:  LAMICTAL  Take 200 mg by mouth every morning.     oxyCODONE 5 MG immediate release tablet  Commonly known as:  Oxy IR/ROXICODONE  Take 1 tablet (5 mg total) by mouth every 6 (six) hours as needed for pain.     Rivaroxaban 15 MG Tabs tablet  Commonly known as:  XARELTO  Take 1 tablet (15 mg total) by mouth 2 (two) times daily with a meal. Take 15mg  BID for 3 weeks till 10/17 then 20mg  daily     Rivaroxaban 20 MG  Tabs tablet  Commonly known as:  XARELTO  Take 1 tablet (20 mg total) by mouth daily with supper. Starting 10/18, take 20mg  daily  Start taking on:  08/23/2013       Allergies  Allergen Reactions  . Percocet [Oxycodone-Acetaminophen] Hives  . Vicodin [Hydrocodone-Acetaminophen] Hives    Hives & swelling       Follow-up Information   Follow up with GARBA,LAWAL, MD. Schedule an appointment as soon as possible for a visit in 1 week.   Specialty:  Internal Medicine   Contact information:   [address] [place] [address] [phone]        The results of significant diagnostics from this hospitalization (including imaging, microbiology, ancillary and laboratory) are listed below for reference.    Significant Diagnostic Studies: Dg Chest 2 View  07/31/2013   *RADIOLOGY REPORT*  Clinical Data: Chest pain  CHEST - 2 VIEW  Comparison: February 17, 2013.  Findings: Cardiomediastinal silhouette appears normal.  No acute pulmonary disease is noted.  Bony thorax is intact.  IMPRESSION: No acute cardiopulmonary abnormality seen.   Original Report Authenticated By: Lupita Raider.,  M.D.   Ct Angio Chest Pe W/cm &/or Wo Cm  07/31/2013   CLINICAL DATA:  Right-sided chest tightness, shortness of breath  EXAM: CT ANGIOGRAPHY CHEST WITH CONTRAST  TECHNIQUE: Multidetector CT imaging of the chest was performed using the standard protocol during bolus administration of intravenous contrast. Multiplanar CT image reconstructions including MIPs were obtained to evaluate the vascular anatomy.  CONTRAST:  OMNIPAQUE IOHEXOL 350 MG/ML SOLN  COMPARISON:  Chest radiograph 07/31/2013, chest CT 02/17/2013  FINDINGS: There is small volume acute to subacute pulmonary arterial embolism in the right lower lobe, image 137 series 10. No CT evidence for right heart strain. Heart size is mildly enlarged. No pericardial or pleural effusion. Great vessels are normal in caliber. Trace superior pericardial  recess fluid is identified. Surgical clips noted at the dome of the liver. A few patchy areas of subtle hypoperfusion are identified in the right lower lobe but no pulmonary parenchymal consolidation, nodule, or mass is identified. No acute osseous finding.  Review of the MIP images confirms the above findings.  IMPRESSION: Small volume acute/subacute pulmonary arterial embolism within the right lower lobe. No CT evidence for right heart strain. CriticalValue/emergent results were called by telephone at the time of interpretation on 07/31/2013 at 6:03 PMto DAVID YAO , who verbally acknowledged these results.   Electronically Signed   By: Christiana Pellant M.D.   On: 07/31/2013 18:05   Mm Digital Screening  07/23/2013   *RADIOLOGY REPORT*  Clinical Data: Screening.  DIGITAL SCREENING BILATERAL MAMMOGRAM WITH  CAD  Comparison: None.  FINDINGS:  ACR Breast Density Category c:  The breast tissue is heterogeneously dense, which may obscure small masses.  There are no findings suspicious for malignancy.  Images were processed with CAD.  IMPRESSION: No mammographic evidence of malignancy.  A result letter of this screening mammogram will be mailed directly to the patient.  RECOMMENATION: Screening mammogram in one year. (Code:SM-B-01Y)  BI-RADS CATEGORY 2:  Benign finding(s).   Original Report Authenticated By: Sherian Rein, M.D.    Microbiology: No results found for this or any previous visit (from the past 240 hour(s)).   Labs: Basic Metabolic Panel: No results found for this basename: NA, K, CL, CO2, GLUCOSE, BUN, CREATININE, CALCIUM, MG, PHOS,  in the last 168 hours Liver Function Tests: No results found for this basename: AST, ALT, ALKPHOS, BILITOT, PROT, ALBUMIN,  in the last 168 hours No results found for this basename: LIPASE, AMYLASE,  in the last 168 hours No results found for this basename: AMMONIA,  in the last 168 hours CBC: No results found for this basename: WBC, NEUTROABS, HGB, HCT, MCV, PLT,   in the last 168 hours Cardiac Enzymes: No results found for this basename: CKTOTAL, CKMB, CKMBINDEX, TROPONINI,  in the last 168 hours BNP: BNP (last 3 results) No results found for this basename: PROBNP,  in the last 8760 hours CBG: No results found for this basename: GLUCAP,  in the last 168 hours     Signed:  Marcelles Clinard  [facility] 08/16/2013, 5:54 PM

## 2013-09-18 ENCOUNTER — Ambulatory Visit: Payer: Medicaid Other | Admitting: Internal Medicine

## 2013-09-25 ENCOUNTER — Ambulatory Visit (INDEPENDENT_AMBULATORY_CARE_PROVIDER_SITE_OTHER): Payer: Medicaid Other | Admitting: Internal Medicine

## 2013-09-25 ENCOUNTER — Encounter: Payer: Self-pay | Admitting: Internal Medicine

## 2013-09-25 VITALS — BP 108/72 | HR 69 | Temp 98.0°F | Ht 65.5 in | Wt 253.0 lb

## 2013-09-25 DIAGNOSIS — J383 Other diseases of vocal cords: Secondary | ICD-10-CM

## 2013-09-25 NOTE — Progress Notes (Signed)
Subjective:    Patient ID: Katie [name], female    DOB: December 21, 1965, 47 y.o.   MRN: [id number]  HPI  47 year old female.  reports that she has never smoked. She has never used smokeless tobacco. Body mass index is 42.47 kg/(m^2). on 01/16/2012  PCCM consult Jan 2013:   This is 47 yo AAF with obesity, sedentary life stile and depression who is being evaluated for dyspnea she reports having for several years. It is intermittent and described as "inability to take deep breath in". It happens at rest and on exertion and usually triggered by emotional stress. The episodes last several minutes and resolved spontaneously. They are not associated with cough, wheezing, chest pain or palpitations. She is lifelong smoker with some exposure to second hand smoke about 15 years ago. There are no other occupational exposures. The degree of her activity is limited to walking around the house and driving to the local stores. She reports that her activity is being limited by being weak and tired, but not by dyspnea or chest pain. Previous workup included polysomnography which reportedly ruled out OSA and PFT the results of which are not known. This was done in [place] years ago.  REC by PCCM consult: Dyspnea of unclear etiology. Doubt organic lung disease given episodic short lasting self resolving symptoms triggered by emotional stress and present for years. Morbid obesity and deconditioning are likely contributing factors. Of note, the description of "not being able to take deep enough breath in" is typical in setting of depression as well as restrictive lung disease . REfer PCCM . Get PFT done  OV 01/16/2012 Hx retake: Chroinic dyspnea x 10 years. Insidious onset. Progressive slowly. CLass 3 activities bring it on. RElieved by rest. Also, sometimes random. Has panic attack hx but feels this is different. Occ cough but no wheeze. Has sister with asthma. No fever. No sputum. No hx of dyspnea worse by triggers likes  pollen, dust, weather. Denies hx of allergies. Says cardiac stress test in [place] 2005 and 2 more in [place] in 2006-2010 negative. Reportedly no OSA. Normal TSH 1.5 in Jan 2013. Has associated obesity. Walking desaturation test 185 feet x 3 laps: no desaturation but was dyspneic.   LABS PFts 11/28/11 Restriction due to obesity and FV distortion suggestive of VCD. FVC 1.9L/61%, fev1 1.47L/57%, Ratio 76 (83), TLC 3.6L/70%, DLCO could not be done CT chest 07/14/09 - some atx. 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 Here today to reviewe results. No interim issues. Dyspnea persists. No admissions or ER visits.   I think your breathing difficulty is due to a condition called  paradoxical vocal cord dysfunction  Please see Dr Melvenia Beam at [facility]  Please attend speech therapy with Mr Verdie Mosher Continued weight loss will also help with breathing issues; congratulations on 65# weight loss I will see you back in 2 months or sooner if needed   OV 09/25/2013  Chief Complaint  Patient presents with  . Follow-up  Pt c/o SOB when talking, voice changes.  Pt req. another referral to speech therapy and ENT since she did not follow up with these referrals last year.   Followup chronic cough  I have not seen her in 18 months. In April 2013 suspected vocal cord dysfunction or true vocal cord issues. Referred her to ENT and speech therapy but she never followed up with these. In the interim cough persists unchanged. In addition on 07/26/2013 she was admitted for 2 days with pulmonary  embolism of the right lower lobe. At the etiological history and it appears that risk factors of obesity, relative immobilization and recent long car rides. She was discharged on xarelto  cT chest confirms PE and lung parenchyma does not show etiology for cough; sept 2014  Review of Systems  Constitutional: Positive for unexpected weight change. Negative for fever.  HENT: Positive for voice change. Negative for congestion, dental problem, ear pain, nosebleeds, postnasal drip, rhinorrhea, sinus pressure, sneezing, sore throat and trouble swallowing.   Eyes: Positive for redness. Negative for itching.  Respiratory: Positive for cough, chest tightness and shortness of breath. Negative for wheezing.   Cardiovascular: Negative for palpitations and leg swelling.  Gastrointestinal: Positive for nausea and vomiting.  Genitourinary: Negative for dysuria.  Musculoskeletal: Positive for joint swelling.  Skin: Negative for rash.  Neurological: Negative for headaches.  Hematological: Does not bruise/bleed easily.  Psychiatric/Behavioral: Positive for dysphoric mood. The patient is not nervous/anxious.        Objective:   Physical Exam  Vitals reviewed. Constitutional: She is oriented to person, place, and time. She appears well-developed and well-nourished. No distress.  Body mass index is 41.45 kg/(m^2).   HENT:  Head: Normocephalic and atraumatic.  Right Ear: External ear normal.  Left Ear: External ear normal.  Mouth/Throat: Oropharynx is clear and moist. No oropharyngeal exudate.  Postnasal drainage present  Eyes: Conjunctivae and EOM are normal. Pupils are equal, round, and reactive to light. Right eye exhibits no discharge. Left eye exhibits no discharge. No scleral icterus.  Neck: Normal range of motion. Neck supple. No JVD present. No tracheal deviation present. No thyromegaly present.  Cardiovascular: Normal rate, regular rhythm, normal heart sounds and intact distal pulses.  Exam  reveals no gallop and no friction rub.   No murmur heard. Pulmonary/Chest: Effort normal and breath sounds normal. No respiratory distress. She has no wheezes. She has no rales. She exhibits no tenderness.  Abdominal: Soft. Bowel sounds are normal. She exhibits no distension and no mass. There is no tenderness. There is no rebound and no guarding.  Musculoskeletal: Normal range of motion. She exhibits no edema and no tenderness.  Lymphadenopathy:    She has no cervical adenopathy.  Neurological: She is alert and oriented to person, place, and time. She has normal reflexes. No cranial nerve deficit. She exhibits normal muscle tone. Coordination normal.  Skin: Skin is warm and dry. No rash noted. She is not diaphoretic. No erythema. No pallor.  Psychiatric: She has a normal mood and affect. Her behavior is normal. Judgment and thought content normal.          Assessment & Plan:

## 2013-09-25 NOTE — Patient Instructions (Addendum)
I think your breathing difficulty and cough in part is due to a condition called  paradoxical vocal cord dysfunction  Some nasal drainage also making you cough  -  take OTC nasal steroid inhaler 2 squirts each nostril daily Please see Dr Melvenia Beam at [facility] ENT to ensure no strucutral ENT problems causing your cough  Please attend neuro rehabilitation with Mr Verdie Mosher for VCD cough REspect your decision to refuse flu shot FU 2 months with me or NP

## 2013-09-25 NOTE — Assessment & Plan Note (Signed)
I think your breathing difficulty and cough in part is due to a condition called  paradoxical vocal cord dysfunction  Some nasal drainage also making you cough  -  take OTC nasal steroid inhaler 2 squirts each nostril daily Please see Dr Melvenia Beam at [facility] ENT to ensure no strucutral ENT problems causing your cough  Please attend neuro rehabilitation with Mr Verdie Mosher for VCD cough REspect your decision to refuse flu shot FU 2 months with me or NP   (> 50% of this 15 min visit spent in face to face counseling)

## 2013-10-07 ENCOUNTER — Ambulatory Visit: Payer: Medicaid Other | Attending: Internal Medicine | Admitting: Speech Pathology

## 2013-10-07 DIAGNOSIS — J383 Other diseases of vocal cords: Secondary | ICD-10-CM | POA: Insufficient documentation

## 2013-10-07 DIAGNOSIS — IMO0001 Reserved for inherently not codable concepts without codable children: Secondary | ICD-10-CM | POA: Insufficient documentation

## 2013-12-03 ENCOUNTER — Encounter (HOSPITAL_COMMUNITY): Payer: Self-pay | Admitting: Emergency Medicine

## 2013-12-03 ENCOUNTER — Emergency Department (HOSPITAL_COMMUNITY)
Admission: EM | Admit: 2013-12-03 | Discharge: 2013-12-04 | Disposition: A | Discharge status: Home / Self Care | Payer: Medicaid Other | Attending: Emergency Medicine | Admitting: Emergency Medicine

## 2013-12-03 DIAGNOSIS — Z86711 Personal history of pulmonary embolism: Secondary | ICD-10-CM | POA: Insufficient documentation

## 2013-12-03 DIAGNOSIS — R112 Nausea with vomiting, unspecified: Secondary | ICD-10-CM | POA: Insufficient documentation

## 2013-12-03 DIAGNOSIS — E669 Obesity, unspecified: Secondary | ICD-10-CM | POA: Insufficient documentation

## 2013-12-03 DIAGNOSIS — Z9089 Acquired absence of other organs: Secondary | ICD-10-CM | POA: Insufficient documentation

## 2013-12-03 DIAGNOSIS — I10 Essential (primary) hypertension: Secondary | ICD-10-CM | POA: Insufficient documentation

## 2013-12-03 DIAGNOSIS — Z8739 Personal history of other diseases of the musculoskeletal system and connective tissue: Secondary | ICD-10-CM | POA: Insufficient documentation

## 2013-12-03 DIAGNOSIS — F411 Generalized anxiety disorder: Secondary | ICD-10-CM | POA: Insufficient documentation

## 2013-12-03 DIAGNOSIS — R1084 Generalized abdominal pain: Secondary | ICD-10-CM | POA: Insufficient documentation

## 2013-12-03 DIAGNOSIS — R197 Diarrhea, unspecified: Secondary | ICD-10-CM | POA: Insufficient documentation

## 2013-12-03 DIAGNOSIS — Z9889 Other specified postprocedural states: Secondary | ICD-10-CM | POA: Insufficient documentation

## 2013-12-03 DIAGNOSIS — F319 Bipolar disorder, unspecified: Secondary | ICD-10-CM | POA: Insufficient documentation

## 2013-12-03 DIAGNOSIS — Z79899 Other long term (current) drug therapy: Secondary | ICD-10-CM | POA: Insufficient documentation

## 2013-12-03 DIAGNOSIS — R109 Unspecified abdominal pain: Secondary | ICD-10-CM

## 2013-12-03 DIAGNOSIS — Z7901 Long term (current) use of anticoagulants: Secondary | ICD-10-CM | POA: Insufficient documentation

## 2013-12-03 HISTORY — DX: Other pulmonary embolism without acute cor pulmonale: I26.99

## 2013-12-03 LAB — CBC WITH DIFFERENTIAL/PLATELET
BASOS ABS: 0 10*3/uL (ref 0.0–0.1)
Basophils Relative: 0 % (ref 0–1)
EOS ABS: 0.1 10*3/uL (ref 0.0–0.7)
Eosinophils Relative: 1 % (ref 0–5)
HCT: 39.1 % (ref 36.0–46.0)
Hemoglobin: 13.2 g/dL (ref 12.0–15.0)
LYMPHS PCT: 31 % (ref 12–46)
Lymphs Abs: 2.9 10*3/uL (ref 0.7–4.0)
MCH: 31.1 pg (ref 26.0–34.0)
MCHC: 33.8 g/dL (ref 30.0–36.0)
MCV: 92 fL (ref 78.0–100.0)
Monocytes Absolute: 0.5 10*3/uL (ref 0.1–1.0)
Monocytes Relative: 6 % (ref 3–12)
Neutro Abs: 5.8 10*3/uL (ref 1.7–7.7)
Neutrophils Relative %: 62 % (ref 43–77)
Platelets: 361 10*3/uL (ref 150–400)
RBC: 4.25 MIL/uL (ref 3.87–5.11)
RDW: 14.3 % (ref 11.5–15.5)
WBC: 9.3 10*3/uL (ref 4.0–10.5)

## 2013-12-03 LAB — LIPASE, BLOOD: Lipase: 22 U/L (ref 11–59)

## 2013-12-03 LAB — COMPREHENSIVE METABOLIC PANEL
ALBUMIN: 3.8 g/dL (ref 3.5–5.2)
ALT: 10 U/L (ref 0–35)
AST: 17 U/L (ref 0–37)
Alkaline Phosphatase: 97 U/L (ref 39–117)
BUN: 5 mg/dL — ABNORMAL LOW (ref 6–23)
CALCIUM: 9 mg/dL (ref 8.4–10.5)
CO2: 26 meq/L (ref 19–32)
Chloride: 97 mEq/L (ref 96–112)
Creatinine, Ser: 0.67 mg/dL (ref 0.50–1.10)
GFR calc Af Amer: >90 mL/min (ref >90)
GFR calc non Af Amer: >90 mL/min (ref >90)
Glucose, Bld: 101 mg/dL — ABNORMAL HIGH (ref 70–99)
Potassium: 3.2 mEq/L — ABNORMAL LOW (ref 3.7–5.3)
SODIUM: 136 meq/L — AB (ref 137–147)
TOTAL PROTEIN: 8 g/dL (ref 6.0–8.3)
Total Bilirubin: 0.4 mg/dL (ref 0.3–1.2)

## 2013-12-03 NOTE — ED Notes (Signed)
Pt c/o abd bloating, abd cramping, n/v/d x 3 days. Possible blood in stool, ? Red in color per pt.

## 2013-12-04 LAB — URINALYSIS, ROUTINE W REFLEX MICROSCOPIC
Bilirubin Urine: NEGATIVE
GLUCOSE, UA: NEGATIVE mg/dL
Hgb urine dipstick: NEGATIVE
Ketones, ur: 40 mg/dL — AB
Leukocytes, UA: NEGATIVE
Nitrite: NEGATIVE
Protein, ur: NEGATIVE mg/dL
SPECIFIC GRAVITY, URINE: 1.013 (ref 1.005–1.030)
Urobilinogen, UA: 0.2 mg/dL (ref 0.0–1.0)
pH: 6 (ref 5.0–8.0)

## 2013-12-04 LAB — OCCULT BLOOD, POC DEVICE: FECAL OCCULT BLD: NEGATIVE

## 2013-12-04 MED ORDER — ONDANSETRON 8 MG PO TBDP: 8.0000 mg | ORAL_TABLET | Freq: Three times a day (TID) | ORAL | Status: DC | PRN

## 2013-12-04 MED ORDER — ONDANSETRON HCL 4 MG/2ML IJ SOLN
4.0000 mg | Freq: Once | INTRAMUSCULAR | Status: AC
  Administered 2013-12-04: 4 mg via INTRAVENOUS
  Filled 2013-12-04: qty 2

## 2013-12-04 MED ORDER — DICYCLOMINE HCL 20 MG PO TABS: 20.0000 mg | ORAL_TABLET | Freq: Four times a day (QID) | ORAL | Status: DC | PRN

## 2013-12-04 MED ORDER — FENTANYL CITRATE 0.05 MG/ML IJ SOLN
100.0000 ug | Freq: Once | INTRAMUSCULAR | Status: AC
  Administered 2013-12-04: 100 ug via INTRAVENOUS
  Filled 2013-12-04: qty 2

## 2013-12-04 MED ORDER — SODIUM CHLORIDE 0.9 % IV SOLN
1000.0000 mL | Freq: Once | INTRAVENOUS | Status: AC
  Administered 2013-12-04: 1000 mL via INTRAVENOUS

## 2013-12-04 MED ORDER — DICYCLOMINE HCL 10 MG/ML IM SOLN
20.0000 mg | Freq: Once | INTRAMUSCULAR | Status: AC
  Administered 2013-12-04: 20 mg via INTRAMUSCULAR
  Filled 2013-12-04: qty 2

## 2013-12-04 MED ORDER — SODIUM CHLORIDE 0.9 % IV SOLN
1000.0000 mL | INTRAVENOUS | Status: DC
  Administered 2013-12-04: 1000 mL via INTRAVENOUS

## 2013-12-04 MED ORDER — PROMETHAZINE HCL 25 MG PO TABS: 25.0000 mg | ORAL_TABLET | Freq: Four times a day (QID) | ORAL | Status: DC | PRN

## 2013-12-04 MED ORDER — HYDROMORPHONE HCL PF 1 MG/ML IJ SOLN
1.0000 mg | Freq: Once | INTRAMUSCULAR | Status: AC
Start: 2013-12-04 — End: 2013-12-04
  Administered 2013-12-04: 1 mg via INTRAVENOUS
  Filled 2013-12-04: qty 1

## 2013-12-04 MED ORDER — PROMETHAZINE HCL 25 MG RE SUPP: 25.0000 mg | Freq: Four times a day (QID) | RECTAL | Status: DC | PRN

## 2013-12-04 NOTE — Discharge Instructions (Signed)
Stick to a bland diet.  Take medications as needed for abdominal cramping and nausea.  Rest.  Return emergency room for worsening condition or new concerning symptoms.   Abdominal Pain, Adult Many things can cause abdominal pain. Usually, abdominal pain is not caused by a disease and will improve without treatment. It can often be observed and treated at home. Your health care provider will do a physical exam and possibly order blood tests and X-rays to help determine the seriousness of your pain. However, in many cases, more time must pass before a clear cause of the pain can be found. Before that point, your health care provider may not know if you need more testing or further treatment. HOME CARE INSTRUCTIONS  Monitor your abdominal pain for any changes. The following actions may help to alleviate any discomfort you are experiencing:  Only take over-the-counter or prescription medicines as directed by your health care provider.  Do not take laxatives unless directed to do so by your health care provider.  Try a clear liquid diet (broth, tea, or water) as directed by your health care provider. Slowly move to a bland diet as tolerated. SEEK MEDICAL CARE IF:  You have unexplained abdominal pain.  You have abdominal pain associated with nausea or diarrhea.  You have pain when you urinate or have a bowel movement.  You experience abdominal pain that wakes you in the night.  You have abdominal pain that is worsened or improved by eating food.  You have abdominal pain that is worsened with eating fatty foods. SEEK IMMEDIATE MEDICAL CARE IF:   Your pain does not go away within 2 hours.  You have a fever.  You keep throwing up (vomiting).  Your pain is felt only in portions of the abdomen, such as the right side or the left lower portion of the abdomen.  You pass bloody or black tarry stools. MAKE SURE YOU:  Understand these instructions.   Will watch your condition.   Will get  help right away if you are not doing well or get worse.  Document Released: 08/02/2005 Document Revised: 08/13/2013 Document Reviewed: 07/02/2013 [facility] Patient Information 2014 [place], [place].  Diarrhea Diarrhea is frequent loose and watery bowel movements. It can cause you to feel weak and dehydrated. Dehydration can cause you to become tired and thirsty, have a dry mouth, and have decreased urination that often is dark yellow. Diarrhea is a sign of another problem, most often an infection that will not last long. In most cases, diarrhea typically lasts 2 3 days. However, it can last longer if it is a sign of something more serious. It is important to treat your diarrhea as directed by your caregive to lessen or prevent future episodes of diarrhea. CAUSES  Some common causes include:  Gastrointestinal infections caused by viruses, bacteria, or parasites.  Food poisoning or food allergies.  Certain medicines, such as antibiotics, chemotherapy, and laxatives.  Artificial sweeteners and fructose.  Digestive disorders. HOME CARE INSTRUCTIONS  Ensure adequate fluid intake (hydration): have 1 cup (8 oz) of fluid for each diarrhea episode. Avoid fluids that contain simple sugars or sports drinks, fruit juices, whole milk products, and sodas. Your urine should be clear or pale yellow if you are drinking enough fluids. Hydrate with an oral rehydration solution that you can purchase at pharmacies, retail stores, and online. You can prepare an oral rehydration solution at home by mixing the following ingredients together:    tsp table salt.   tsp  baking soda.   tsp salt substitute containing potassium chloride.  1  tablespoons sugar.  1 L (34 oz) of water.  Certain foods and beverages may increase the speed at which food moves through the gastrointestinal (GI) tract. These foods and beverages should be avoided and include:  Caffeinated and alcoholic beverages.  High-fiber foods, such  as raw fruits and vegetables, nuts, seeds, and whole grain breads and cereals.  Foods and beverages sweetened with sugar alcohols, such as xylitol, sorbitol, and mannitol.  Some foods may be well tolerated and may help thicken stool including:  Starchy foods, such as rice, toast, pasta, low-sugar cereal, oatmeal, grits, baked potatoes, crackers, and bagels.  Bananas.  Applesauce.  Add probiotic-rich foods to help increase healthy bacteria in the GI tract, such as yogurt and fermented milk products.  Wash your hands well after each diarrhea episode.  Only take over-the-counter or prescription medicines as directed by your caregiver.  Take a warm bath to relieve any burning or pain from frequent diarrhea episodes. SEEK IMMEDIATE MEDICAL CARE IF:   You are unable to keep fluids down.  You have persistent vomiting.  You have blood in your stool, or your stools are black and tarry.  You do not urinate in 6 8 hours, or there is only a small amount of very dark urine.  You have abdominal pain that increases or localizes.  You have weakness, dizziness, confusion, or lightheadedness.  You have a severe headache.  Your diarrhea gets worse or does not get better.  You have a fever or persistent symptoms for more than 2 3 days.  You have a fever and your symptoms suddenly get worse. MAKE SURE YOU:   Understand these instructions.  Will watch your condition.  Will get help right away if you are not doing well or get worse. Document Released: 10/13/2002 Document Revised: 10/09/2012 Document Reviewed: 06/30/2012 [facility] Patient Information 2014 [place], [place].  Diet for Diarrhea, Adult Frequent, runny stools (diarrhea) may be caused or worsened by food or drink. Diarrhea may be relieved by changing your diet. Since diarrhea can last up to 7 days, it is easy for you to lose too much fluid from the body and become dehydrated. Fluids that are lost need to be replaced. Along with a  modified diet, make sure you drink enough fluids to keep your urine clear or pale yellow. DIET INSTRUCTIONS  Ensure adequate fluid intake (hydration): have 1 cup (8 oz) of fluid for each diarrhea episode. Avoid fluids that contain simple sugars or sports drinks, fruit juices, whole milk products, and sodas. Your urine should be clear or pale yellow if you are drinking enough fluids. Hydrate with an oral rehydration solution that you can purchase at pharmacies, retail stores, and online. You can prepare an oral rehydration solution at home by mixing the following ingredients together:    tsp table salt.   tsp baking soda.   tsp salt substitute containing potassium chloride.  1  tablespoons sugar.  1 L (34 oz) of water.  Certain foods and beverages may increase the speed at which food moves through the gastrointestinal (GI) tract. These foods and beverages should be avoided and include:  Caffeinated and alcoholic beverages.  High-fiber foods, such as raw fruits and vegetables, nuts, seeds, and whole grain breads and cereals.  Foods and beverages sweetened with sugar alcohols, such as xylitol, sorbitol, and mannitol.  Some foods may be well tolerated and may help thicken stool including:  Starchy foods, such as  rice, toast, pasta, low-sugar cereal, oatmeal, grits, baked potatoes, crackers, and bagels.   Bananas.   Applesauce.  Add probiotic-rich foods to help increase healthy bacteria in the GI tract, such as yogurt and fermented milk products. RECOMMENDED FOODS AND BEVERAGES Starches Choose foods with less than 2 g of fiber per serving.  Recommended:  White, [place], and pita breads, plain rolls, buns, bagels. Plain muffins, matzo. Soda, saltine, or graham crackers. Pretzels, melba toast, zwieback. Cooked cereals made with water: cornmeal, farina, cream cereals. Dry cereals: refined corn, wheat, rice. Potatoes prepared any way without skins, refined macaroni, spaghetti, noodles,  refined rice.  Avoid:  Bread, rolls, or crackers made with whole wheat, multi-grains, rye, bran seeds, nuts, or coconut. Corn tortillas or taco shells. Cereals containing whole grains, multi-grains, bran, coconut, nuts, raisins. Cooked or dry oatmeal. Coarse wheat cereals, granola. Cereals advertised as "high-fiber." Potato skins. Whole grain pasta, wild or brown rice. Popcorn. Sweet potatoes, yams. Sweet rolls, doughnuts, waffles, pancakes, sweet breads. Vegetables  Recommended: Strained tomato and vegetable juices. Most well-cooked and canned vegetables without seeds. Fresh: Tender lettuce, cucumber without the skin, cabbage, spinach, bean sprouts.  Avoid: Fresh, cooked, or canned: Artichokes, baked beans, beet greens, broccoli, Brussels sprouts, corn, kale, legumes, peas, sweet potatoes. Cooked: Green or red cabbage, spinach. Avoid large servings of any vegetables because vegetables shrink when cooked, and they contain more fiber per serving than fresh vegetables. Fruit  Recommended: Cooked or canned: Apricots, applesauce, cantaloupe, cherries, fruit cocktail, grapefruit, grapes, kiwi, mandarin oranges, peaches, pears, plums, watermelon. Fresh: Apples without skin, ripe banana, grapes, cantaloupe, cherries, grapefruit, peaches, oranges, plums. Keep servings limited to  cup or 1 piece.  Avoid: Fresh: Apples with skin, apricots, mangoes, pears, raspberries, strawberries. Prune juice, stewed or dried prunes. Dried fruits, raisins, dates. Large servings of all fresh fruits. Protein  Recommended: Ground or well-cooked tender beef, ham, veal, lamb, pork, or poultry. Eggs. Fish, oysters, shrimp, lobster, other seafoods. Liver, organ meats.  Avoid: Tough, fibrous meats with gristle. Peanut butter, smooth or chunky. Cheese, nuts, seeds, legumes, dried peas, beans, lentils. Dairy  Recommended: Yogurt, lactose-free milk, kefir, drinkable yogurt, buttermilk, soy milk, or plain hard cheese.  Avoid:  Milk, chocolate milk, beverages made with milk, such as milkshakes. Soups  Recommended: Bouillon, broth, or soups made from allowed foods. Any strained soup.  Avoid: Soups made from vegetables that are not allowed, cream or milk-based soups. Desserts and Sweets  Recommended: Sugar-free gelatin, sugar-free frozen ice pops made without sugar alcohol.  Avoid: Plain cakes and cookies, pie made with fruit, pudding, custard, cream pie. Gelatin, fruit, ice, sherbet, frozen ice pops. Ice cream, ice milk without nuts. Plain hard candy, honey, jelly, molasses, syrup, sugar, chocolate syrup, gumdrops, marshmallows. Fats and Oils  Recommended: Limit fats to less than 8 tsp per day.  Avoid: Seeds, nuts, olives, avocados. Margarine, butter, cream, mayonnaise, salad oils, plain salad dressings. Plain gravy, crisp bacon without rind. Beverages  Recommended: Water, decaffeinated teas, oral rehydration solutions, sugar-free beverages not sweetened with sugar alcohols.  Avoid: Fruit juices, caffeinated beverages (coffee, tea, soda), alcohol, sports drinks, or lemon-lime soda. Condiments  Recommended: Ketchup, mustard, horseradish, vinegar, cocoa powder. Spices in moderation: allspice, basil, bay leaves, celery powder or leaves, cinnamon, cumin powder, curry powder, ginger, mace, marjoram, onion or garlic powder, oregano, paprika, parsley flakes, ground pepper, rosemary, sage, savory, tarragon, thyme, turmeric.  Avoid: Coconut, honey. Document Released: 01/13/2004 Document Revised: 07/17/2012 Document Reviewed: 03/08/2012 [facility] Patient Information 2014 [place], [place].  Nausea and  Vomiting Nausea is a sick feeling that often comes before throwing up (vomiting). Vomiting is a reflex where stomach contents come out of your mouth. Vomiting can cause severe loss of body fluids (dehydration). Children and elderly adults can become dehydrated quickly, especially if they also have diarrhea. Nausea and vomiting  are symptoms of a condition or disease. It is important to find the cause of your symptoms. CAUSES   Direct irritation of the stomach lining. This irritation can result from increased acid production (gastroesophageal reflux disease), infection, food poisoning, taking certain medicines (such as nonsteroidal anti-inflammatory drugs), alcohol use, or tobacco use.  Signals from the brain.These signals could be caused by a headache, heat exposure, an inner ear disturbance, increased pressure in the brain from injury, infection, a tumor, or a concussion, pain, emotional stimulus, or metabolic problems.  An obstruction in the gastrointestinal tract (bowel obstruction).  Illnesses such as diabetes, hepatitis, gallbladder problems, appendicitis, kidney problems, cancer, sepsis, atypical symptoms of a heart attack, or eating disorders.  Medical treatments such as chemotherapy and radiation.  Receiving medicine that makes you sleep (general anesthetic) during surgery. DIAGNOSIS Your caregiver may ask for tests to be done if the problems do not improve after a few days. Tests may also be done if symptoms are severe or if the reason for the nausea and vomiting is not clear. Tests may include:  Urine tests.  Blood tests.  Stool tests.  Cultures (to look for evidence of infection).  X-rays or other imaging studies. Test results can help your caregiver make decisions about treatment or the need for additional tests. TREATMENT You need to stay well hydrated. Drink frequently but in small amounts.You may wish to drink water, sports drinks, clear broth, or eat frozen ice pops or gelatin dessert to help stay hydrated.When you eat, eating slowly may help prevent nausea.There are also some antinausea medicines that may help prevent nausea. HOME CARE INSTRUCTIONS   Take all medicine as directed by your caregiver.  If you do not have an appetite, do not force yourself to eat. However, you must continue  to drink fluids.  If you have an appetite, eat a normal diet unless your caregiver tells you differently.  Eat a variety of complex carbohydrates (rice, wheat, potatoes, bread), lean meats, yogurt, fruits, and vegetables.  Avoid high-fat foods because they are more difficult to digest.  Drink enough water and fluids to keep your urine clear or pale yellow.  If you are dehydrated, ask your caregiver for specific rehydration instructions. Signs of dehydration may include:  Severe thirst.  Dry lips and mouth.  Dizziness.  Dark urine.  Decreasing urine frequency and amount.  Confusion.  Rapid breathing or pulse. SEEK IMMEDIATE MEDICAL CARE IF:   You have blood or brown flecks (like coffee grounds) in your vomit.  You have black or bloody stools.  You have a severe headache or stiff neck.  You are confused.  You have severe abdominal pain.  You have chest pain or trouble breathing.  You do not urinate at least once every 8 hours.  You develop cold or clammy skin.  You continue to vomit for longer than 24 to 48 hours.  You have a fever. MAKE SURE YOU:   Understand these instructions.  Will watch your condition.  Will get help right away if you are not doing well or get worse. Document Released: 10/23/2005 Document Revised: 01/15/2012 Document Reviewed: 03/22/2011 [facility] Information 2014 [place], [place].

## 2013-12-04 NOTE — ED Provider Notes (Signed)
CSN: [id number]     Arrival date & time 12/03/13  1953 History   First MD Initiated Contact with Patient 12/03/13 2300     Chief Complaint  Patient presents with  . Abdominal Pain   (Consider location/radiation/quality/duration/timing/severity/associated sxs/prior Treatment) HPI 48 year old female presents to emergency room with complaint of diffuse abdominal cramping starting 3 days ago, followed by nausea, vomiting, and diarrhea.  She's been unable to keep anything down.  She reports initially her stools were green, now they're more red in color.  She is on xarelto for history of PE.  No known sick contacts, no unusual foods, no fevers, no travel.  Pain is diffuse, no focal regions.  She is status post cholecystectomy.  No prior history of diverticulitis, IBS. Past Medical History  Diagnosis Date  . OA (osteoarthritis)   . HTN (hypertension)   . Obesity   . Bipolar 1 disorder   . Depression   . Depression   . Back pain   . Anxiety   . Sciatica   . PE (pulmonary embolism)     oct 2014   Past Surgical History  Procedure Laterality Date  . Cholecystectomy  2007  . Cardiac catheterization  2006    normal  . Cesarean section  2002  . Right knee sur  2005  . Tonsillectomy    . Tympanostomy tube placement     Family History  Problem Relation Age of Onset  . Heart disease Mother   . Diabetes Father   . Asthma Sister     also had rheumatic fever as a child, died age 33   History  Substance Use Topics  . Smoking status: Never Smoker   . Smokeless tobacco: Never Used     Comment: second hand smoke  . Alcohol Use: Yes     Comment: occ   OB History   Grav Para Term Preterm Abortions TAB SAB Ect Mult Living                 Review of Systems  See History of Present Illness; otherwise all other systems are reviewed and negative Allergies  Percocet and Vicodin  Home Medications   Current Outpatient Rx  Name  Route  Sig  Dispense  Refill  . ALPRAZolam (XANAX) 0.5 MG  tablet   Oral   Take 0.5 mg by mouth 3 (three) times daily as needed for anxiety.          Marland Kitchen desvenlafaxine (PRISTIQ) 100 MG 24 hr tablet   Oral   Take 100 mg by mouth every morning.          Marland Kitchen esomeprazole (NEXIUM) 40 MG capsule   Oral   Take 40 mg by mouth daily before breakfast.         . furosemide (LASIX) 40 MG tablet   Oral   Take 1 tablet (40 mg total) by mouth daily.   30 tablet      . guaiFENesin (MUCINEX) 600 MG 12 hr tablet   Oral   Take 1,200 mg by mouth 2 (two) times daily.         Marland Kitchen lamoTRIgine (LAMICTAL) 200 MG tablet   Oral   Take 200 mg by mouth every morning.          Marland Kitchen oxyCODONE (OXY IR/ROXICODONE) 5 MG immediate release tablet   Oral   Take 1 tablet (5 mg total) by mouth every 6 (six) hours as needed for pain.   30 tablet  0   . Rivaroxaban (XARELTO) 20 MG TABS tablet   Oral   Take 1 tablet (20 mg total) by mouth daily with supper. Starting 10/18, take 20mg  daily   30 tablet   0    BP 128/68  Pulse 71  Temp(Src) 98.4 F (36.9 C) (Oral)  Resp 18  Ht 5\' 6"  (1.676 m)  Wt 250 lb (113.399 kg)  BMI 40.37 kg/m2  SpO2 100%  LMP 11/19/2013 Physical Exam  Nursing note and vitals reviewed. Constitutional: She is oriented to person, place, and time. She appears well-developed and well-nourished.  HENT:  Head: Normocephalic and atraumatic.  Nose: Nose normal.  Mouth/Throat: Oropharynx is clear and moist.  Eyes: Conjunctivae and EOM are normal. Pupils are equal, round, and reactive to light.  Neck: Normal range of motion. Neck supple. No JVD present. No tracheal deviation present. No thyromegaly present.  Cardiovascular: Normal rate, regular rhythm, normal heart sounds and intact distal pulses.  Exam reveals no gallop and no friction rub.   No murmur heard. Pulmonary/Chest: Effort normal and breath sounds normal. No stridor. No respiratory distress. She has no wheezes. She has no rales. She exhibits no tenderness.  Abdominal: Soft. She  exhibits no distension and no mass. There is tenderness (diffuse tenderness). There is no rebound and no guarding.  Hyperactive bowel sounds  Musculoskeletal: Normal range of motion. She exhibits no edema and no tenderness.  Lymphadenopathy:    She has no cervical adenopathy.  Neurological: She is alert and oriented to person, place, and time. She exhibits normal muscle tone. Coordination normal.  Skin: Skin is warm and dry. No rash noted. No erythema. No pallor.  Psychiatric: She has a normal mood and affect. Her behavior is normal. Judgment and thought content normal.    ED Course  Procedures (including critical care time) Labs Review Labs Reviewed  COMPREHENSIVE METABOLIC PANEL - Abnormal; Notable for the following:    Sodium 136 (*)    Potassium 3.2 (*)    Glucose, Bld 101 (*)    BUN 5 (*)    All other components within normal limits  URINALYSIS, ROUTINE W REFLEX MICROSCOPIC - Abnormal; Notable for the following:    Ketones, ur 40 (*)    All other components within normal limits  CBC WITH DIFFERENTIAL  LIPASE, BLOOD  OCCULT BLOOD, POC DEVICE   Imaging Review No results found.  EKG Interpretation   None       MDM   1. Nausea vomiting and diarrhea   2. Abdominal cramping    48 year old female with 3 days of abdominal pain, nausea, vomiting, diarrhea.  Labs are reassuring.  Plan for IV hydration, pain, and nausea medication.  Once feeling better, plan to discharge home with similar medications.   Olivia Mackielga M Adonnis Salceda, MD 12/04/13 [phone]

## 2014-05-27 ENCOUNTER — Encounter (HOSPITAL_COMMUNITY): Payer: Self-pay | Admitting: Emergency Medicine

## 2014-05-27 ENCOUNTER — Emergency Department (HOSPITAL_COMMUNITY)
Admission: EM | Admit: 2014-05-27 | Discharge: 2014-05-27 | Disposition: A | Discharge status: Home / Self Care | Payer: Medicaid Other | Attending: Emergency Medicine | Admitting: Emergency Medicine

## 2014-05-27 DIAGNOSIS — Z7901 Long term (current) use of anticoagulants: Secondary | ICD-10-CM | POA: Diagnosis not present

## 2014-05-27 DIAGNOSIS — I10 Essential (primary) hypertension: Secondary | ICD-10-CM | POA: Diagnosis not present

## 2014-05-27 DIAGNOSIS — M5430 Sciatica, unspecified side: Secondary | ICD-10-CM | POA: Insufficient documentation

## 2014-05-27 DIAGNOSIS — M1990 Unspecified osteoarthritis, unspecified site: Secondary | ICD-10-CM | POA: Diagnosis not present

## 2014-05-27 DIAGNOSIS — Z79899 Other long term (current) drug therapy: Secondary | ICD-10-CM | POA: Diagnosis not present

## 2014-05-27 DIAGNOSIS — M5442 Lumbago with sciatica, left side: Secondary | ICD-10-CM

## 2014-05-27 DIAGNOSIS — F319 Bipolar disorder, unspecified: Secondary | ICD-10-CM | POA: Insufficient documentation

## 2014-05-27 DIAGNOSIS — E669 Obesity, unspecified: Secondary | ICD-10-CM | POA: Insufficient documentation

## 2014-05-27 DIAGNOSIS — M25559 Pain in unspecified hip: Secondary | ICD-10-CM | POA: Insufficient documentation

## 2014-05-27 DIAGNOSIS — F411 Generalized anxiety disorder: Secondary | ICD-10-CM | POA: Insufficient documentation

## 2014-05-27 DIAGNOSIS — Z86711 Personal history of pulmonary embolism: Secondary | ICD-10-CM | POA: Insufficient documentation

## 2014-05-27 MED ORDER — KETOROLAC TROMETHAMINE 30 MG/ML IJ SOLN
30.0000 mg | Freq: Once | INTRAMUSCULAR | Status: AC
  Administered 2014-05-27: 30 mg via INTRAVENOUS
  Filled 2014-05-27: qty 1

## 2014-05-27 MED ORDER — OXYCODONE HCL 5 MG PO TABS
5.0000 mg | ORAL_TABLET | Freq: Once | ORAL | Status: AC
  Administered 2014-05-27: 5 mg via ORAL
  Filled 2014-05-27: qty 1

## 2014-05-27 MED ORDER — OXYCODONE HCL 5 MG PO TABS: 5.0000 mg | ORAL_TABLET | Freq: Four times a day (QID) | ORAL | Status: DC | PRN

## 2014-05-27 MED ORDER — DIAZEPAM 5 MG/ML IJ SOLN
2.5000 mg | Freq: Once | INTRAMUSCULAR | Status: AC
  Administered 2014-05-27: 2.5 mg via INTRAVENOUS
  Filled 2014-05-27: qty 2

## 2014-05-27 MED ORDER — HYDROMORPHONE HCL PF 1 MG/ML IJ SOLN
1.0000 mg | Freq: Once | INTRAMUSCULAR | Status: AC
  Administered 2014-05-27: 1 mg via INTRAVENOUS
  Filled 2014-05-27: qty 1

## 2014-05-27 NOTE — Discharge Instructions (Signed)

## 2014-05-27 NOTE — ED Notes (Signed)
Pt is c/o left hip pain that has gotten worse since the weekend  Pt states she has arthritis and sciatica   Pt states she had a MRI on her back about 3 weeks ago and is going to the dr on Friday for the results

## 2014-06-02 NOTE — ED Provider Notes (Signed)
Medical screening examination/treatment/procedure(s) were performed by non-physician practitioner and as supervising physician I was immediately available for consultation/collaboration.   EKG Interpretation None        Lyanne CoKevin M Azai Gaffin, MD 06/02/14 [phone]

## 2014-06-02 NOTE — ED Provider Notes (Signed)
CSN: [id number]     Arrival date & time 05/27/14  7829 History   First MD Initiated Contact with Patient 05/27/14 [phone]     Chief Complaint  Patient presents with  . Hip Pain    (Consider location/radiation/quality/duration/timing/severity/associated sxs/prior Treatment) HPI Comments: Patient ambulates with cane at baseline.  Patient is a 48 y.o. female presenting with back pain. The history is provided by the patient. No language interpreter was used.  Back Pain Location:  Lumbar spine Quality:  Aching (and sharp) Radiates to:  L thigh Pain severity:  Moderate Onset quality:  Gradual Duration:  3 days Timing:  Constant Progression:  Worsening Chronicity:  Recurrent Context: not falling, not lifting heavy objects and not twisting   Relieved by:  Nothing (Patient out of Oxycodone tablets x 3 days) Worsened by:  Bending, ambulation, palpation and twisting Ineffective treatments:  NSAIDs Associated symptoms: no bladder incontinence, no bowel incontinence, no dysuria, no fever, no numbness, no perianal numbness, no tingling and no weakness   Risk factors: lack of exercise and obesity     Past Medical History  Diagnosis Date  . OA (osteoarthritis)   . HTN (hypertension)   . Obesity   . Bipolar 1 disorder   . Depression   . Depression   . Back pain   . Anxiety   . Sciatica   . PE (pulmonary embolism)     oct 2014   Past Surgical History  Procedure Laterality Date  . Cholecystectomy  2007  . Cardiac catheterization  2006    normal  . Cesarean section  2002  . Right knee sur  2005  . Tonsillectomy    . Tympanostomy tube placement     Family History  Problem Relation Age of Onset  . Heart disease Mother   . Diabetes Father   . Asthma Sister     also had rheumatic fever as a child, died age 78   History  Substance Use Topics  . Smoking status: Never Smoker   . Smokeless tobacco: Never Used     Comment: second hand smoke  . Alcohol Use: Yes     Comment: occ    OB History   Grav Para Term Preterm Abortions TAB SAB Ect Mult Living                  Review of Systems  Constitutional: Negative for fever.  Gastrointestinal: Negative for bowel incontinence.  Genitourinary: Negative for bladder incontinence and dysuria.  Musculoskeletal: Positive for back pain and myalgias.  Neurological: Negative for tingling, weakness and numbness.  All other systems reviewed and are negative.    Allergies  Percocet and Vicodin  Home Medications   Prior to Admission medications   Medication Sig Start Date End Date Taking? Authorizing Provider  ALPRAZolam Prudy Feeler) 0.5 MG tablet Take 0.5 mg by mouth 3 (three) times daily as needed for anxiety.    Yes Historical Provider, MD  esomeprazole (NEXIUM) 40 MG capsule Take 40 mg by mouth daily before breakfast.   Yes Historical Provider, MD  furosemide (LASIX) 40 MG tablet Take 1 tablet (40 mg total) by mouth daily. 08/02/13  Yes Zannie Cove, MD  lamoTRIgine (LAMICTAL) 200 MG tablet Take 200 mg by mouth every morning.    Yes Historical Provider, MD  POTASSIUM PO Take 200 mg by mouth daily.   Yes Historical Provider, MD  Rivaroxaban (XARELTO) 20 MG TABS tablet Take 1 tablet (20 mg total) by mouth daily with supper. Starting 10/18,  take 20mg  daily 08/23/13  Yes Zannie CovePreetha Joseph, MD  oxyCODONE (OXY IR/ROXICODONE) 5 MG immediate release tablet Take 1 tablet (5 mg total) by mouth every 6 (six) hours as needed. 05/27/14   Antony MaduraKelly Edmar Blankenburg, PA-C   BP 142/82  Pulse 57  Temp(Src) 97.6 F (36.4 C) (Oral)  Resp 18  SpO2 100%  LMP 05/03/2014  Physical Exam  Nursing note and vitals reviewed. Constitutional: She is oriented to person, place, and time. She appears well-developed and well-nourished. No distress.  Nontoxic/nonseptic appearing  HENT:  Head: Normocephalic and atraumatic.  Eyes: Conjunctivae and EOM are normal. No scleral icterus.  Neck: Normal range of motion.  Cardiovascular: Normal rate, regular rhythm and  intact distal pulses.   DP and PT pulses 2+ bilaterally  Pulmonary/Chest: Effort normal. No respiratory distress.  Musculoskeletal: She exhibits tenderness.  Mild decreased range of motion of low back secondary to pain. Tenderness to palpation of left lumbar paraspinal muscles. No appreciable spasm. No midline tenderness, bony deformities, or step-offs palpated.  Neurological: She is alert and oriented to person, place, and time. She exhibits normal muscle tone. Coordination normal.  No gross sensory deficits appreciated. Patellar and Achilles reflexes 2+. Patient ambulatory with one person assist. She does not have her cane and states she uses this at baseline.  Skin: Skin is warm and dry. No rash noted. She is not diaphoretic. No erythema. No pallor.  Psychiatric: She has a normal mood and affect. Her behavior is normal.    ED Course  Procedures (including critical care time) Labs Review Labs Reviewed - No data to display  Imaging Review No results found.   EKG Interpretation None      MDM   Final diagnoses:  Left-sided low back pain with left-sided sciatica    Patient with back pain; hx of same. No history of new trauma or injury. Patient states that she ran out of her oxycodone tablets 3 days ago which usually controls her pain fairly well. Patient neurovascularly intact on physical exam. No gross sensory deficits appreciated. Patient can walk but states is painful. No loss of bowel or bladder control. No concern for cauda equina. No fever, night sweats, weight loss, h/o cancer, IVDU. Pain controlled and ED with Valium, Dilaudid, and Toradol. Patient feels that she can manage her symptoms further at home. RICE protocol and pain medicine indicated and discussed with patient. Return precautions discussed and provided. Patient agreeable to plan with no unaddressed concerns.   Filed Vitals:   05/27/14 0336 05/27/14 0612  BP: 153/79 142/82  Pulse: 59 57  Temp: 97.6 F (36.4 C)    TempSrc: Oral   Resp: 18 18  SpO2: 100% 100%        Antony MaduraKelly Keyton Bhat, PA-C 06/02/14 [phone]

## 2014-06-09 ENCOUNTER — Other Ambulatory Visit: Payer: Self-pay | Admitting: Internal Medicine

## 2014-06-09 DIAGNOSIS — Z1231 Encounter for screening mammogram for malignant neoplasm of breast: Secondary | ICD-10-CM

## 2014-06-20 ENCOUNTER — Emergency Department (HOSPITAL_COMMUNITY)
Admission: EM | Admit: 2014-06-20 | Discharge: 2014-06-20 | Disposition: A | Discharge status: Home / Self Care | Payer: Medicaid Other | Attending: Emergency Medicine | Admitting: Emergency Medicine

## 2014-06-20 ENCOUNTER — Emergency Department (HOSPITAL_COMMUNITY): Payer: Medicaid Other

## 2014-06-20 ENCOUNTER — Encounter (HOSPITAL_COMMUNITY): Payer: Self-pay | Admitting: Emergency Medicine

## 2014-06-20 DIAGNOSIS — M1990 Unspecified osteoarthritis, unspecified site: Secondary | ICD-10-CM | POA: Diagnosis not present

## 2014-06-20 DIAGNOSIS — S01501A Unspecified open wound of lip, initial encounter: Secondary | ICD-10-CM | POA: Insufficient documentation

## 2014-06-20 DIAGNOSIS — S0300XA Dislocation of jaw, unspecified side, initial encounter: Secondary | ICD-10-CM | POA: Diagnosis not present

## 2014-06-20 DIAGNOSIS — F319 Bipolar disorder, unspecified: Secondary | ICD-10-CM | POA: Diagnosis not present

## 2014-06-20 DIAGNOSIS — X58XXXA Exposure to other specified factors, initial encounter: Secondary | ICD-10-CM | POA: Insufficient documentation

## 2014-06-20 DIAGNOSIS — R6884 Jaw pain: Secondary | ICD-10-CM | POA: Insufficient documentation

## 2014-06-20 DIAGNOSIS — I10 Essential (primary) hypertension: Secondary | ICD-10-CM | POA: Diagnosis not present

## 2014-06-20 DIAGNOSIS — Z9889 Other specified postprocedural states: Secondary | ICD-10-CM | POA: Insufficient documentation

## 2014-06-20 DIAGNOSIS — Z86711 Personal history of pulmonary embolism: Secondary | ICD-10-CM | POA: Diagnosis not present

## 2014-06-20 DIAGNOSIS — Z7901 Long term (current) use of anticoagulants: Secondary | ICD-10-CM | POA: Diagnosis not present

## 2014-06-20 DIAGNOSIS — F411 Generalized anxiety disorder: Secondary | ICD-10-CM | POA: Insufficient documentation

## 2014-06-20 DIAGNOSIS — Y939 Activity, unspecified: Secondary | ICD-10-CM | POA: Insufficient documentation

## 2014-06-20 DIAGNOSIS — Z79899 Other long term (current) drug therapy: Secondary | ICD-10-CM | POA: Insufficient documentation

## 2014-06-20 DIAGNOSIS — Y929 Unspecified place or not applicable: Secondary | ICD-10-CM | POA: Insufficient documentation

## 2014-06-20 LAB — CBC WITH DIFFERENTIAL/PLATELET
BASOS ABS: 0 10*3/uL (ref 0.0–0.1)
Basophils Relative: 0 % (ref 0–1)
Eosinophils Absolute: 0.1 10*3/uL (ref 0.0–0.7)
Eosinophils Relative: 1 % (ref 0–5)
HCT: 34.4 % — ABNORMAL LOW (ref 36.0–46.0)
Hemoglobin: 11.4 g/dL — ABNORMAL LOW (ref 12.0–15.0)
LYMPHS ABS: 5.3 10*3/uL — AB (ref 0.7–4.0)
LYMPHS PCT: 56 % — AB (ref 12–46)
MCH: 27.7 pg (ref 26.0–34.0)
MCHC: 33.1 g/dL (ref 30.0–36.0)
MCV: 83.5 fL (ref 78.0–100.0)
Monocytes Absolute: 0.6 10*3/uL (ref 0.1–1.0)
Monocytes Relative: 6 % (ref 3–12)
NEUTROS ABS: 3.5 10*3/uL (ref 1.7–7.7)
NEUTROS PCT: 37 % — AB (ref 43–77)
PLATELETS: 347 10*3/uL (ref 150–400)
RBC: 4.12 MIL/uL (ref 3.87–5.11)
RDW: 16.1 % — AB (ref 11.5–15.5)
WBC: 9.5 10*3/uL (ref 4.0–10.5)

## 2014-06-20 LAB — BASIC METABOLIC PANEL
Anion gap: 13 (ref 5–15)
BUN: 5 mg/dL — ABNORMAL LOW (ref 6–23)
CO2: 28 meq/L (ref 19–32)
Calcium: 9.3 mg/dL (ref 8.4–10.5)
Chloride: 99 mEq/L (ref 96–112)
Creatinine, Ser: 0.74 mg/dL (ref 0.50–1.10)
GFR calc Af Amer: >90 mL/min (ref >90)
GFR calc non Af Amer: >90 mL/min (ref >90)
GLUCOSE: 95 mg/dL (ref 70–99)
POTASSIUM: 3.1 meq/L — AB (ref 3.7–5.3)
SODIUM: 140 meq/L (ref 137–147)

## 2014-06-20 MED ORDER — FENTANYL CITRATE 0.05 MG/ML IJ SOLN
50.0000 ug | Freq: Once | INTRAMUSCULAR | Status: AC
  Administered 2014-06-20: 50 ug via INTRAVENOUS
  Filled 2014-06-20: qty 2

## 2014-06-20 MED ORDER — DIPHENHYDRAMINE HCL 25 MG PO CAPS: 25.0000 mg | ORAL_CAPSULE | Freq: Four times a day (QID) | ORAL | Status: DC | PRN

## 2014-06-20 MED ORDER — KETOROLAC TROMETHAMINE 30 MG/ML IJ SOLN
30.0000 mg | Freq: Once | INTRAMUSCULAR | Status: AC
  Administered 2014-06-20: 30 mg via INTRAVENOUS
  Filled 2014-06-20: qty 1

## 2014-06-20 MED ORDER — PROPOFOL 10 MG/ML IV BOLUS
0.5000 mg/kg | Freq: Once | INTRAVENOUS | Status: AC
  Administered 2014-06-20: 50 mg via INTRAVENOUS
  Filled 2014-06-20: qty 1

## 2014-06-20 MED ORDER — IBUPROFEN 600 MG PO TABS: 600.0000 mg | ORAL_TABLET | Freq: Four times a day (QID) | ORAL | Status: DC | PRN

## 2014-06-20 MED ORDER — HYDROCODONE-ACETAMINOPHEN 5-325 MG PO TABS: 1.0000 | ORAL_TABLET | Freq: Four times a day (QID) | ORAL | Status: DC | PRN

## 2014-06-20 NOTE — ED Provider Notes (Signed)
CSN: [id number]     Arrival date & time 06/20/14  0034 History   First MD Initiated Contact with Patient 06/20/14 0053     Chief Complaint  Patient presents with  . Jaw Pain  . Temporomandibular Joint Pain     (Consider location/radiation/quality/duration/timing/severity/associated sxs/prior Treatment) HPI Comments: PT comes in with cc of jaw pain. Pt thiks that she yawned, and ended up causing jaw dislocation. She has had dislocation in the past. Pt is complaining of constant pain to the jaw. There is hx of PE for the patient on xarelto.   The history is provided by the patient.    Past Medical History  Diagnosis Date  . OA (osteoarthritis)   . HTN (hypertension)   . Obesity   . Bipolar 1 disorder   . Depression   . Depression   . Back pain   . Anxiety   . Sciatica   . PE (pulmonary embolism)     oct 2014   Past Surgical History  Procedure Laterality Date  . Cholecystectomy  2007  . Cardiac catheterization  2006    normal  . Cesarean section  2002  . Right knee sur  2005  . Tonsillectomy    . Tympanostomy tube placement     Family History  Problem Relation Age of Onset  . Heart disease Mother   . Diabetes Father   . Asthma Sister     also had rheumatic fever as a child, died age 46   History  Substance Use Topics  . Smoking status: Never Smoker   . Smokeless tobacco: Never Used     Comment: second hand smoke  . Alcohol Use: Yes     Comment: occ   OB History   Grav Para Term Preterm Abortions TAB SAB Ect Mult Living                 Review of Systems  Constitutional: Positive for activity change.  HENT: Positive for facial swelling.   Neurological: Positive for speech difficulty.  Hematological: Bruises/bleeds easily.      Allergies  Percocet and Vicodin  Home Medications   Prior to Admission medications   Medication Sig Start Date End Date Taking? Authorizing Provider  ALPRAZolam Prudy Feeler(XANAX) 1 MG tablet Take 1 mg by mouth at bedtime.   Yes  Historical Provider, MD  furosemide (LASIX) 40 MG tablet Take 1 tablet (40 mg total) by mouth daily. 08/02/13  Yes Zannie CovePreetha Joseph, MD  lamoTRIgine (LAMICTAL) 200 MG tablet Take 200 mg by mouth every morning.    Yes Historical Provider, MD  lamoTRIgine (LAMICTAL) 25 MG tablet Take 75 mg by mouth at bedtime.   Yes Historical Provider, MD  naproxen sodium (ANAPROX) 220 MG tablet Take 220 mg by mouth once.   Yes Historical Provider, MD  omeprazole (PRILOSEC) 40 MG capsule Take 40 mg by mouth daily.   Yes Historical Provider, MD  Rivaroxaban (XARELTO) 20 MG TABS tablet Take 1 tablet (20 mg total) by mouth daily with supper. Starting 10/18, take 20mg  daily 08/23/13  Yes Zannie CovePreetha Joseph, MD   BP 104/87  Pulse 62  Temp(Src) 96.9 F (36.1 C) (Axillary)  Resp 19  Wt 240 lb (108.863 kg)  SpO2 98%  LMP 05/03/2014 Physical Exam  Nursing note and vitals reviewed. Constitutional: She is oriented to person, place, and time. She appears well-developed.  HENT:  Appears to have anterior dislocation of the jaw on exam -and patient is unable to close her mouth.  Eyes: Conjunctivae are normal.  Neck: Neck supple.  Cardiovascular: Normal rate.   Pulmonary/Chest: Effort normal.  Neurological: She is alert and oriented to person, place, and time.    ED Course  Reduction of dislocation Date/Time: 06/20/2014 3:00 AM Performed by: Derwood Kaplan Authorized by: Derwood Kaplan Consent: Verbal consent obtained. written consent obtained. Risks and benefits: risks, benefits and alternatives were discussed Consent given by: patient Patient identity confirmed: verbally with patient Time out: Immediately prior to procedure a "time out" was called to verify the correct patient, procedure, equipment, support staff and site/side marked as required. Preparation: Patient was prepped and draped in the usual sterile fashion. Patient tolerance: Patient tolerated the procedure well with no immediate  complications. Comments: Pt has a small lip laceration, no active bleeding on the buccal surface, left inferior lip.   (including critical care time) Labs Review Labs Reviewed  CBC WITH DIFFERENTIAL - Abnormal; Notable for the following:    Hemoglobin 11.4 (*)    HCT 34.4 (*)    RDW 16.1 (*)    Neutrophils Relative % 37 (*)    Lymphocytes Relative 56 (*)    Lymphs Abs 5.3 (*)    All other components within normal limits  BASIC METABOLIC PANEL - Abnormal; Notable for the following:    Potassium 3.1 (*)    BUN 5 (*)    All other components within normal limits    Imaging Review Dg Tmj Open & Close Bilateral  06/20/2014   CLINICAL DATA:  Possible temporomandibular joint dislocation.  EXAM: TEMPOROMANDIBULAR JOINTS  COMPARISON:  None.  FINDINGS: Suspected subluxed right mandible condyles though, limited by obliquity. Patient was unable to close mouth. No definite fracture deformity. No destructive bony lesions.  IMPRESSION: Possibly subluxed right anterior condyle, assessment limited by plain radiography. CT would be more sensitive for bony pathology, if clinical concern for temporomandibular internal derangement, MRI would be more sensitive.   Electronically Signed   By: Awilda Metro   On: 06/20/2014 02:44     EKG Interpretation None      MDM   Final diagnoses:  TMJ (dislocation of temporomandibular joint), initial encounter    Procedural sedation Performed by: Derwood Kaplan Consent: Verbal consent obtained. Risks and benefits: risks, benefits and alternatives were discussed Required items: required blood products, implants, devices, and special equipment available Patient identity confirmed: arm band and provided demographic data Time out: Immediately prior to procedure a "time out" was called to verify the correct patient, procedure, equipment, support staff and site/side marked as required.  Sedation type: moderate (conscious) sedation NPO time confirmed and  considedered  Sedatives: PROPOFOL  Physician Time at Bedside: 25 minutes  Vitals: Vital signs were monitored during sedation. Cardiac Monitor, pulse oximeter Patient tolerance: Patient tolerated the procedure well with no immediate complications. Comments: Pt with uneventful recovered. Returned to pre-procedural sedation baseline   Pt comes in with cc of TMJ dislocation, which was reduced by [place]. Pt received about 175 mg of propofol. We observed her in the ER post reduction and procedural sedation to ensure she was alert and able to ambulate. Pt has had hx of dislocations, and so ENT f/u recommended given multiple dislocation hx.  Derwood Kaplan, MD 06/21/14 [phone]

## 2014-06-20 NOTE — ED Notes (Addendum)
Pt requested that her rx be changed to oxycodone vs the vicodin that she was prescribed. Dr Rhunette CroftNanavati had already left building. Notified Dr. Elesa MassedWard and she stated that the pt cannot be allergic to one and not the other and she does not want the legal liability to change the meds and that is why Dr.Nanavati rx'd benadryl to pt. Pt verbalized understanding. Pt is A&O and in NAD. Pt family at bedside

## 2014-06-20 NOTE — ED Notes (Signed)
Patient transported to X-ray 

## 2014-06-20 NOTE — Sedation Documentation (Signed)
Pt. Post procedural Sedation and mandible (TMJ) reduction, no adverse reaction, V/S within normal ranges, used bathroom per wheelchair w/o distress.

## 2014-06-20 NOTE — Discharge Instructions (Signed)
We reduced your jaw in the ER. Because your jaw has been dislocated multiple times, it is recommended that you see an ENT doctor.  Avoid extreme opening of the jaw for three weeks. Apply warm compresses to the TMJ area for 24 hours. Maintain a soft diet for one week.     Jaw Dislocation A jaw dislocation is the displacement of the joint where the upper jaw bone (maxilla) and the lower jaw bone (mandibula) meet (temporomandibular joint). Soon after the dislocation, the jaw muscles tighten. This prevents the mouth from closing normally.  CAUSES A jaw dislocation usually is caused by a sudden forceful impact to the jaw. A strong punch in the jaw during a fist fight or a sports injury are examples of causes of jaw dislocation. Another cause is injury due to car or motorcycle accidents. RISK FACTORS Although anyone can have a jaw dislocation, some people are more at risk than others. People at increased risk for jaw dislocation include participants in contact sports. SYMPTOMS Symptoms of jaw dislocation can vary, depending on the severity of the dislocation. They can include:  Feeling that your teeth are out of alignment when you bite.  Inability to close your mouth completely.  Drooling.  Extreme pain, with the inability to move your jaw. DIAGNOSIS Your caregiver will feel your temporomandibular joints and ask you to move your jaw. Your caregiver also will feel the inside of your mouth to make sure there are no fractures or cuts (lacerations). TREATMENT Your caregiver will manipulate your jaw to put it back into place (reduction). If you have any jaw fractures from the dislocation, they usually will be held in place with plates and screws or with wiring.  HOME CARE INSTRUCTIONS The following measures can help to reduce pain and hasten the healing process:  Rest your injured joint. Do not move it. Avoid activities similar to the one that caused your injury.  Apply ice to your injured  joint for 1 to 2 days after your reduction or as directed by your caregiver. Applying ice helps to reduce inflammation and pain.  Put ice in a plastic bag.  Place a towel between your skin and the bag.  Leave the ice on for 15-20 minutes at a time, 03-04 times a day.  Take over-the-counter or prescription medication for pain as directed by your caregiver. Also, your caregiver may instruct you to only have certain foods until your jaw heals. These foods may be soft or liquified so that your jaw does not have to move much to eat them. SEEK IMMEDIATE MEDICAL CARE IF:  You have plates and screws or wiring to hold your jaw together that becomes loose or damaged.  You develop drainage from any of the cuts (incisions) where your wires or plates and screws were placed.  Your pain becomes worse rather than better. MAKE SURE YOU:  Understand these instructions.  Will watch your condition.  Will get help right away if you are not doing well or get worse. Document Released: 10/20/2000 Document Revised: 01/15/2012 Document Reviewed: 03/23/2011 [facility] Patient Information 2015 [employer], [place]. This information is not intended to replace advice given to you by your health care provider. Make sure you discuss any questions you have with your health care provider.

## 2014-06-20 NOTE — ED Notes (Signed)
Bed: [id number] Expected date:  Expected time:  Means of arrival:  Comments: EMS 73F jaw pain, hx TMJ

## 2014-06-20 NOTE — ED Notes (Addendum)
Per [facility]  , pt. From home with  complaint of jaw pain at 10/10   with TMJ around 1200 midnight , pt. Has had history of TMJ , no SOB , pt. Stated that she was already in bed and felt the jaw pain.

## 2014-06-20 NOTE — ED Notes (Signed)
Pt with hx of PE and past TMJ dislocation comes in after dislocating her jaw. Jaw to be reduced with procedural sedation.  Derwood KaplanAnkit Rashaud Ybarbo, MD 06/20/14 [phone]

## 2014-07-24 ENCOUNTER — Ambulatory Visit
Admission: RE | Admit: 2014-07-24 | Discharge: 2014-07-24 | Disposition: A | Discharge status: Home / Self Care | Payer: Medicaid Other | Source: Ambulatory Visit | Attending: Internal Medicine | Admitting: Internal Medicine

## 2014-07-24 DIAGNOSIS — Z1231 Encounter for screening mammogram for malignant neoplasm of breast: Secondary | ICD-10-CM

## 2015-01-13 ENCOUNTER — Emergency Department (HOSPITAL_COMMUNITY): Payer: Medicaid Other

## 2015-01-13 ENCOUNTER — Encounter (HOSPITAL_COMMUNITY): Payer: Self-pay

## 2015-01-13 ENCOUNTER — Observation Stay (HOSPITAL_COMMUNITY)
Admission: EM | Admit: 2015-01-13 | Discharge: 2015-01-15 | Disposition: A | Discharge status: Home / Self Care | Payer: Medicaid Other | Attending: Internal Medicine | Admitting: Internal Medicine

## 2015-01-13 DIAGNOSIS — F319 Bipolar disorder, unspecified: Secondary | ICD-10-CM | POA: Diagnosis not present

## 2015-01-13 DIAGNOSIS — Z7901 Long term (current) use of anticoagulants: Secondary | ICD-10-CM | POA: Diagnosis not present

## 2015-01-13 DIAGNOSIS — Z9049 Acquired absence of other specified parts of digestive tract: Secondary | ICD-10-CM | POA: Insufficient documentation

## 2015-01-13 DIAGNOSIS — F419 Anxiety disorder, unspecified: Secondary | ICD-10-CM | POA: Insufficient documentation

## 2015-01-13 DIAGNOSIS — R079 Chest pain, unspecified: Secondary | ICD-10-CM | POA: Diagnosis present

## 2015-01-13 DIAGNOSIS — M1990 Unspecified osteoarthritis, unspecified site: Secondary | ICD-10-CM | POA: Diagnosis not present

## 2015-01-13 DIAGNOSIS — R0789 Other chest pain: Secondary | ICD-10-CM | POA: Diagnosis not present

## 2015-01-13 DIAGNOSIS — F32A Depression, unspecified: Secondary | ICD-10-CM | POA: Diagnosis present

## 2015-01-13 DIAGNOSIS — Z86711 Personal history of pulmonary embolism: Secondary | ICD-10-CM | POA: Diagnosis not present

## 2015-01-13 DIAGNOSIS — K580 Irritable bowel syndrome with diarrhea: Secondary | ICD-10-CM | POA: Diagnosis not present

## 2015-01-13 DIAGNOSIS — Z6839 Body mass index (BMI) 39.0-39.9, adult: Secondary | ICD-10-CM | POA: Insufficient documentation

## 2015-01-13 DIAGNOSIS — F329 Major depressive disorder, single episode, unspecified: Secondary | ICD-10-CM | POA: Diagnosis present

## 2015-01-13 DIAGNOSIS — M79601 Pain in right arm: Secondary | ICD-10-CM | POA: Diagnosis not present

## 2015-01-13 DIAGNOSIS — I10 Essential (primary) hypertension: Secondary | ICD-10-CM | POA: Diagnosis present

## 2015-01-13 DIAGNOSIS — E876 Hypokalemia: Secondary | ICD-10-CM | POA: Diagnosis not present

## 2015-01-13 DIAGNOSIS — K219 Gastro-esophageal reflux disease without esophagitis: Secondary | ICD-10-CM | POA: Diagnosis present

## 2015-01-13 DIAGNOSIS — R0600 Dyspnea, unspecified: Secondary | ICD-10-CM | POA: Diagnosis present

## 2015-01-13 DIAGNOSIS — Z79899 Other long term (current) drug therapy: Secondary | ICD-10-CM | POA: Diagnosis not present

## 2015-01-13 DIAGNOSIS — Z7982 Long term (current) use of aspirin: Secondary | ICD-10-CM | POA: Diagnosis not present

## 2015-01-13 DIAGNOSIS — E669 Obesity, unspecified: Secondary | ICD-10-CM | POA: Diagnosis not present

## 2015-01-13 DIAGNOSIS — I2699 Other pulmonary embolism without acute cor pulmonale: Secondary | ICD-10-CM | POA: Diagnosis present

## 2015-01-13 LAB — I-STAT TROPONIN, ED: TROPONIN I, POC: 0 ng/mL (ref 0.00–0.08)

## 2015-01-13 MED ORDER — GI COCKTAIL ~~LOC~~
30.0000 mL | Freq: Once | ORAL | Status: AC
  Administered 2015-01-14: 30 mL via ORAL
  Filled 2015-01-13: qty 30

## 2015-01-13 MED ORDER — DEXTROSE 5 % IV SOLN
500.0000 mg | Freq: Once | INTRAVENOUS | Status: AC
Start: 2015-01-13 — End: 2015-01-14
  Administered 2015-01-14: 500 mg via INTRAVENOUS
  Filled 2015-01-13: qty 5

## 2015-01-13 MED ORDER — KETOROLAC TROMETHAMINE 30 MG/ML IJ SOLN
30.0000 mg | Freq: Once | INTRAMUSCULAR | Status: AC
  Administered 2015-01-14: 30 mg via INTRAVENOUS
  Filled 2015-01-13: qty 1

## 2015-01-13 MED ORDER — OXYCODONE HCL 5 MG PO TABS
5.0000 mg | ORAL_TABLET | ORAL | Status: DC | PRN
  Administered 2015-01-14 (×2): 5 mg via ORAL
  Filled 2015-01-13 (×2): qty 1

## 2015-01-13 MED ORDER — OXYCODONE HCL 5 MG PO TABS: 10.0000 mg | ORAL_TABLET | ORAL | Status: DC | PRN

## 2015-01-13 NOTE — ED Provider Notes (Signed)
CSN: [id number]     Arrival date & time 01/13/15  2141 History   First MD Initiated Contact with Patient 01/13/15 2331     Chief Complaint  Patient presents with  . Chest Pain     (Consider location/radiation/quality/duration/timing/severity/associated sxs/prior Treatment) Patient is a 49 y.o. female presenting with chest pain. The history is provided by the patient.  Chest Pain Pain location:  R chest (starts in the arm and moves into the right chest) Pain quality: sharp   Pain radiates to the back: no   Pain severity:  Moderate Onset quality:  Sudden Duration:  11 hours Timing:  Constant Progression:  Unchanged Chronicity:  Recurrent Context: lifting   Context comment:  Cleaning and lifting a water cooler bottle of water Relieved by:  Nothing Worsened by:  Nothing tried Ineffective treatments:  None tried Associated symptoms: no claudication, no fever, no lower extremity edema, no palpitations, no shortness of breath and no weakness   Risk factors: no aortic disease     Past Medical History  Diagnosis Date  . OA (osteoarthritis)   . HTN (hypertension)   . Obesity   . Bipolar 1 disorder   . Depression   . Depression   . Back pain   . Anxiety   . Sciatica   . PE (pulmonary embolism)     oct 2014   Past Surgical History  Procedure Laterality Date  . Cholecystectomy  2007  . Cardiac catheterization  2006    normal  . Cesarean section  2002  . Right knee sur  2005  . Tonsillectomy    . Tympanostomy tube placement     Family History  Problem Relation Age of Onset  . Heart disease Mother   . Diabetes Father   . Asthma Sister     also had rheumatic fever as a child, died age 47   History  Substance Use Topics  . Smoking status: Never Smoker   . Smokeless tobacco: Never Used     Comment: second hand smoke  . Alcohol Use: Yes     Comment: occ   OB History    No data available     Review of Systems  Constitutional: Negative for fever.  Respiratory:  Negative for shortness of breath.   Cardiovascular: Positive for chest pain. Negative for palpitations, claudication and leg swelling.  Neurological: Negative for weakness.  All other systems reviewed and are negative.     Allergies  Percocet and Vicodin  Home Medications   Prior to Admission medications   Medication Sig Start Date End Date Taking? Authorizing Provider  ALPRAZolam Prudy Feeler(XANAX) 1 MG tablet Take 1 mg by mouth at bedtime.   Yes Historical Provider, MD  aspirin 81 MG chewable tablet Chew 81 mg by mouth every morning.   Yes Historical Provider, MD  furosemide (LASIX) 40 MG tablet Take 1 tablet (40 mg total) by mouth daily. 08/02/13  Yes Zannie CovePreetha Joseph, MD  guaiFENesin (MUCINEX) 600 MG 12 hr tablet Take 600 mg by mouth 2 (two) times daily as needed for cough.   Yes Historical Provider, MD  naproxen sodium (ANAPROX) 220 MG tablet Take 220 mg by mouth once.   Yes Historical Provider, MD  omeprazole (PRILOSEC) 40 MG capsule Take 40 mg by mouth daily.   Yes Historical Provider, MD  oxyCODONE (OXY IR/ROXICODONE) 5 MG immediate release tablet Take 1 mg by mouth 2 (two) times daily. 12/16/14  Yes Historical Provider, MD  phentermine (ADIPEX-P) 37.5 MG tablet Take  37.5 mg by mouth daily before breakfast.   Yes Historical Provider, MD  Rivaroxaban (XARELTO) 20 MG TABS tablet Take 1 tablet (20 mg total) by mouth daily with supper. Starting 10/18, take  daily 08/23/13  Yes Zannie Cove, MD  diphenhydrAMINE (BENADRYL) 25 mg capsule Take 1 capsule (25 mg total) by mouth every 6 (six) hours as needed for itching or allergies. Patient not taking: Reported on 01/13/2015 06/20/14   Derwood Kaplan, MD  HYDROcodone-acetaminophen (NORCO/VICODIN) 5-325 MG per tablet Take 1 tablet by mouth every 6 (six) hours as needed. Patient not taking: Reported on 01/13/2015 06/20/14   Derwood Kaplan, MD  ibuprofen (ADVIL,MOTRIN) 600 MG tablet Take 1 tablet (600 mg total) by mouth every 6 (six) hours as  needed. Patient not taking: Reported on 01/13/2015 06/20/14   Derwood Kaplan, MD  lamoTRIgine (LAMICTAL) 25 MG tablet Take 75 mg by mouth at bedtime.    Historical Provider, MD   BP 119/73 mmHg  Pulse 59  Temp(Src) 98.6 F (37 C) (Oral)  Resp 14  Ht  (1.651 m)  Wt 236 lb (107.049 kg)  BMI 39.27 kg/m2  SpO2 99%  LMP 01/09/2015 Physical Exam  Constitutional: She is oriented to person, place, and time. She appears well-developed and well-nourished. No distress.  HENT:  Head: Normocephalic and atraumatic.  Mouth/Throat: Oropharynx is clear and moist.  Eyes: Conjunctivae are normal. Pupils are equal, round, and reactive to light.  Neck: Normal range of motion. Neck supple.  Cardiovascular: Normal rate and regular rhythm.   Pulmonary/Chest: Effort normal and breath sounds normal. No respiratory distress. She has no wheezes. She has no rales. She exhibits tenderness.  As well as tenderness over the right bicep bicep tendon both heads are intact 5/5 RUE strength  Abdominal: Soft. Bowel sounds are increased. There is no tenderness. There is no rebound.  Musculoskeletal: Normal range of motion.  Neurological: She is alert and oriented to person, place, and time.  Skin: Skin is warm and dry. She is not diaphoretic.  Psychiatric: She has a normal mood and affect.    ED Course  Procedures (including critical care time) Labs Review Labs Reviewed  CBC  BASIC METABOLIC PANEL  I-STAT TROPOININ, ED  POC URINE PREG, ED    Imaging Review Dg Chest 2 View  01/13/2015   CLINICAL DATA:  Centralized chest pain radiating up the neck and down both hands since the in today. Shortness of breath. Previous history of pulmonary embolus.  EXAM: CHEST  2 VIEW  COMPARISON:  07/31/2013  FINDINGS: Shallow inspiration. The heart size and mediastinal contours are within normal limits. Both lungs are clear. The visualized skeletal structures are unremarkable.  IMPRESSION: No active cardiopulmonary disease.    Electronically Signed   By: Burman Nieves M.D.   On: 01/13/2015 22:35     EKG Interpretation   Date/Time:  Wednesday January 13 2015 21:49:32 EST Ventricular Rate:  62 PR Interval:  151 QRS Duration: 102 QT Interval:  469 QTC Calculation: 476 R Axis:   -48 Text Interpretation:  Sinus rhythm borderline Left axis deviation  Confirmed by [facility]  MD, Rayni Nemitz ([id number]) on 01/13/2015 11:32:34 PM      MDM   Final diagnoses:  Chest pain   Medications  oxyCODONE (Oxy IR/ROXICODONE) immediate release tablet 5 mg (5 mg Oral Given 01/14/15 0527)  magnesium sulfate IVPB 1 g 100 mL (1 g Intravenous New Bag/Given 01/14/15 0534)  potassium chloride 10 mEq in 100 mL IVPB (10 mEq Intravenous New  Bag/Given 01/14/15 0530)  ketorolac (TORADOL) 30 MG/ML injection 30 mg (30 mg Intravenous Given 01/14/15 0035)  methocarbamol (ROBAXIN) 500 mg in dextrose 5 % 50 mL IVPB (0 mg Intravenous Stopped 01/14/15 0105)  gi cocktail (Maalox,Lidocaine,Donnatal) (30 mLs Oral Given 01/14/15 0036)  iohexol (OMNIPAQUE) 350 MG/ML injection 100 mL (100 mLs Intravenous Contrast Given 01/14/15 0121)  potassium chloride 10 mEq in 100 mL IVPB (10 mEq Intravenous New Bag/Given 01/14/15 0441)  Rivaroxaban (XARELTO) tablet 15 mg (15 mg Oral Given 01/14/15 0239)  potassium chloride SA (K-DUR,KLOR-CON) CR tablet 80 mEq (80 mEq Oral Given 01/14/15 0527)    Case d/w Dr. Clyde Lundborg, tele obs    Cy Blamer, MD 01/14/15 [phone]

## 2015-01-13 NOTE — ED Notes (Signed)
Pt complains of centralized chest pain that radiates up her neck and down to both hands since 5pm today

## 2015-01-14 ENCOUNTER — Other Ambulatory Visit (HOSPITAL_COMMUNITY): Payer: Self-pay

## 2015-01-14 ENCOUNTER — Encounter (HOSPITAL_COMMUNITY): Payer: Self-pay

## 2015-01-14 ENCOUNTER — Emergency Department (HOSPITAL_COMMUNITY): Payer: Medicaid Other

## 2015-01-14 DIAGNOSIS — I2699 Other pulmonary embolism without acute cor pulmonale: Secondary | ICD-10-CM

## 2015-01-14 DIAGNOSIS — R0789 Other chest pain: Secondary | ICD-10-CM

## 2015-01-14 DIAGNOSIS — E876 Hypokalemia: Secondary | ICD-10-CM

## 2015-01-14 DIAGNOSIS — F329 Major depressive disorder, single episode, unspecified: Secondary | ICD-10-CM

## 2015-01-14 DIAGNOSIS — I10 Essential (primary) hypertension: Secondary | ICD-10-CM

## 2015-01-14 DIAGNOSIS — K210 Gastro-esophageal reflux disease with esophagitis: Secondary | ICD-10-CM

## 2015-01-14 DIAGNOSIS — R079 Chest pain, unspecified: Secondary | ICD-10-CM

## 2015-01-14 DIAGNOSIS — F319 Bipolar disorder, unspecified: Secondary | ICD-10-CM

## 2015-01-14 LAB — COMPREHENSIVE METABOLIC PANEL
ALK PHOS: 62 U/L (ref 39–117)
ALT: 10 U/L (ref 0–35)
ANION GAP: 4 — AB (ref 5–15)
AST: 17 U/L (ref 0–37)
Albumin: 3.4 g/dL — ABNORMAL LOW (ref 3.5–5.2)
BUN: 5 mg/dL — ABNORMAL LOW (ref 6–23)
CO2: 29 mmol/L (ref 19–32)
Calcium: 8 mg/dL — ABNORMAL LOW (ref 8.4–10.5)
Chloride: 104 mmol/L (ref 96–112)
Creatinine, Ser: 0.7 mg/dL (ref 0.50–1.10)
GFR calc Af Amer: >90 mL/min (ref >90)
GFR calc non Af Amer: >90 mL/min (ref >90)
Glucose, Bld: 79 mg/dL (ref 70–99)
POTASSIUM: 3.3 mmol/L — AB (ref 3.5–5.1)
Sodium: 137 mmol/L (ref 135–145)
Total Bilirubin: 1.2 mg/dL (ref 0.3–1.2)
Total Protein: 7.1 g/dL (ref 6.0–8.3)

## 2015-01-14 LAB — RAPID URINE DRUG SCREEN, HOSP PERFORMED
Amphetamines: POSITIVE — AB
BARBITURATES: POSITIVE — AB
Benzodiazepines: POSITIVE — AB
COCAINE: NOT DETECTED
Opiates: POSITIVE — AB
Tetrahydrocannabinol: POSITIVE — AB

## 2015-01-14 LAB — BASIC METABOLIC PANEL
Anion gap: 7 (ref 5–15)
BUN: 6 mg/dL (ref 6–23)
CALCIUM: 8.8 mg/dL (ref 8.4–10.5)
CO2: 28 mmol/L (ref 19–32)
CREATININE: 0.61 mg/dL (ref 0.50–1.10)
Chloride: 102 mmol/L (ref 96–112)
GLUCOSE: 93 mg/dL (ref 70–99)
POTASSIUM: 2.3 mmol/L — AB (ref 3.5–5.1)
Sodium: 137 mmol/L (ref 135–145)

## 2015-01-14 LAB — CBC
HCT: 39.3 % (ref 36.0–46.0)
HEMATOCRIT: 39.5 % (ref 36.0–46.0)
HEMOGLOBIN: 13.2 g/dL (ref 12.0–15.0)
Hemoglobin: 13.4 g/dL (ref 12.0–15.0)
MCH: 32.8 pg (ref 26.0–34.0)
MCH: 32.8 pg (ref 26.0–34.0)
MCHC: 33.9 g/dL (ref 30.0–36.0)
MCHC: 33.6 g/dL (ref 30.0–36.0)
MCV: 96.6 fL (ref 78.0–100.0)
MCV: 97.8 fL (ref 78.0–100.0)
PLATELETS: 298 10*3/uL (ref 150–400)
Platelets: 293 10*3/uL (ref 150–400)
RBC: 4.09 MIL/uL (ref 3.87–5.11)
RBC: 4.02 MIL/uL (ref 3.87–5.11)
RDW: 13.9 % (ref 11.5–15.5)
RDW: 14.2 % (ref 11.5–15.5)
WBC: 8.9 10*3/uL (ref 4.0–10.5)
WBC: 7.6 10*3/uL (ref 4.0–10.5)

## 2015-01-14 LAB — LIPASE, BLOOD: Lipase: 20 U/L (ref 11–59)

## 2015-01-14 LAB — PROTIME-INR
INR: 1.26 (ref 0.00–1.49)
Prothrombin Time: 15.9 seconds — ABNORMAL HIGH (ref 11.6–15.2)

## 2015-01-14 LAB — LIPID PANEL
Cholesterol: 157 mg/dL (ref 0–200)
HDL: 55 mg/dL (ref >39)
LDL CALC: 89 mg/dL (ref 0–99)
Total CHOL/HDL Ratio: 2.9 RATIO
Triglycerides: 66 mg/dL (ref <150)
VLDL: 13 mg/dL (ref 0–40)

## 2015-01-14 LAB — APTT: APTT: 34 s (ref 24–37)

## 2015-01-14 LAB — POTASSIUM: POTASSIUM: 2.4 mmol/L — AB (ref 3.5–5.1)

## 2015-01-14 LAB — POC URINE PREG, ED: PREG TEST UR: NEGATIVE

## 2015-01-14 LAB — TSH: TSH: 1.382 u[IU]/mL (ref 0.350–4.500)

## 2015-01-14 LAB — TROPONIN I: Troponin I: <0.03 ng/mL (ref <0.031)

## 2015-01-14 LAB — MAGNESIUM
MAGNESIUM: 1.9 mg/dL (ref 1.5–2.5)
Magnesium: 1.6 mg/dL (ref 1.5–2.5)

## 2015-01-14 MED ORDER — ALPRAZOLAM 1 MG PO TABS
1.0000 mg | ORAL_TABLET | Freq: Every day | ORAL | Status: DC
  Administered 2015-01-14: 1 mg via ORAL
  Filled 2015-01-14: qty 1

## 2015-01-14 MED ORDER — POTASSIUM CHLORIDE 10 MEQ/100ML IV SOLN
10.0000 meq | INTRAVENOUS | Status: AC
  Administered 2015-01-14 (×3): 10 meq via INTRAVENOUS
  Filled 2015-01-14 (×4): qty 100

## 2015-01-14 MED ORDER — IOHEXOL 350 MG/ML SOLN
100.0000 mL | Freq: Once | INTRAVENOUS | Status: AC | PRN
  Administered 2015-01-14: 100 mL via INTRAVENOUS

## 2015-01-14 MED ORDER — POTASSIUM CHLORIDE 10 MEQ/100ML IV SOLN
10.0000 meq | Freq: Once | INTRAVENOUS | Status: AC
  Administered 2015-01-14: 10 meq via INTRAVENOUS

## 2015-01-14 MED ORDER — RIVAROXABAN 20 MG PO TABS
20.0000 mg | ORAL_TABLET | Freq: Every day | ORAL | Status: DC
  Administered 2015-01-14: 20 mg via ORAL
  Filled 2015-01-14 (×2): qty 1

## 2015-01-14 MED ORDER — OXYCODONE HCL 5 MG PO TABS
5.0000 mg | ORAL_TABLET | Freq: Two times a day (BID) | ORAL | Status: DC
  Administered 2015-01-14 – 2015-01-15 (×2): 5 mg via ORAL
  Filled 2015-01-14 (×2): qty 1

## 2015-01-14 MED ORDER — SODIUM CHLORIDE 0.9 % IJ SOLN
3.0000 mL | Freq: Two times a day (BID) | INTRAMUSCULAR | Status: DC
  Administered 2015-01-14: 3 mL via INTRAVENOUS

## 2015-01-14 MED ORDER — IBUPROFEN 600 MG PO TABS
600.0000 mg | ORAL_TABLET | Freq: Three times a day (TID) | ORAL | Status: DC | PRN
  Filled 2015-01-14: qty 1

## 2015-01-14 MED ORDER — HYDRALAZINE HCL 20 MG/ML IJ SOLN: 5.0000 mg | INTRAMUSCULAR | Status: DC | PRN

## 2015-01-14 MED ORDER — PNEUMOCOCCAL VAC POLYVALENT 25 MCG/0.5ML IJ INJ
0.5000 mL | INJECTION | INTRAMUSCULAR | Status: AC
  Administered 2015-01-15: 0.5 mL via INTRAMUSCULAR
  Filled 2015-01-14 (×2): qty 0.5

## 2015-01-14 MED ORDER — OXYCODONE HCL 5 MG PO TABS
1.0000 mg | ORAL_TABLET | Freq: Two times a day (BID) | ORAL | Status: DC
  Administered 2015-01-14: 2.5 mg via ORAL
  Filled 2015-01-14: qty 1

## 2015-01-14 MED ORDER — SODIUM CHLORIDE 0.9 % IV SOLN
INTRAVENOUS | Status: DC
  Administered 2015-01-14 (×2): via INTRAVENOUS

## 2015-01-14 MED ORDER — NAPROXEN SODIUM 275 MG PO TABS
275.0000 mg | ORAL_TABLET | Freq: Once | ORAL | Status: AC
  Administered 2015-01-14: 275 mg via ORAL
  Filled 2015-01-14: qty 1

## 2015-01-14 MED ORDER — OXYCODONE HCL 5 MG PO TABS
2.5000 mg | ORAL_TABLET | Freq: Once | ORAL | Status: AC
  Administered 2015-01-14: 2.5 mg via ORAL

## 2015-01-14 MED ORDER — RIVAROXABAN 15 MG PO TABS
15.0000 mg | ORAL_TABLET | Freq: Once | ORAL | Status: AC
  Administered 2015-01-14: 15 mg via ORAL
  Filled 2015-01-14: qty 1

## 2015-01-14 MED ORDER — MORPHINE SULFATE 2 MG/ML IJ SOLN: 2.0000 mg | INTRAMUSCULAR | Status: DC | PRN

## 2015-01-14 MED ORDER — MAGNESIUM SULFATE IN D5W 10-5 MG/ML-% IV SOLN
1.0000 g | Freq: Once | INTRAVENOUS | Status: AC
  Administered 2015-01-14: 1 g via INTRAVENOUS
  Filled 2015-01-14: qty 100

## 2015-01-14 MED ORDER — NITROGLYCERIN 0.4 MG SL SUBL: 0.4000 mg | SUBLINGUAL_TABLET | SUBLINGUAL | Status: DC | PRN

## 2015-01-14 MED ORDER — POTASSIUM CHLORIDE CRYS ER 20 MEQ PO TBCR
40.0000 meq | EXTENDED_RELEASE_TABLET | Freq: Once | ORAL | Status: AC
  Administered 2015-01-14: 40 meq via ORAL
  Filled 2015-01-14: qty 2

## 2015-01-14 MED ORDER — ALUM & MAG HYDROXIDE-SIMETH 200-200-20 MG/5ML PO SUSP: 30.0000 mL | Freq: Four times a day (QID) | ORAL | Status: DC | PRN

## 2015-01-14 MED ORDER — ASPIRIN 81 MG PO CHEW
81.0000 mg | CHEWABLE_TABLET | ORAL | Status: DC
  Administered 2015-01-14 – 2015-01-15 (×2): 81 mg via ORAL
  Filled 2015-01-14 (×2): qty 1

## 2015-01-14 MED ORDER — GUAIFENESIN ER 600 MG PO TB12: 600.0000 mg | ORAL_TABLET | Freq: Two times a day (BID) | ORAL | Status: DC | PRN

## 2015-01-14 MED ORDER — POTASSIUM CHLORIDE CRYS ER 20 MEQ PO TBCR
80.0000 meq | EXTENDED_RELEASE_TABLET | Freq: Once | ORAL | Status: AC
  Administered 2015-01-14: 80 meq via ORAL
  Filled 2015-01-14: qty 4

## 2015-01-14 MED ORDER — PANTOPRAZOLE SODIUM 40 MG PO TBEC
40.0000 mg | DELAYED_RELEASE_TABLET | Freq: Every day | ORAL | Status: DC
  Administered 2015-01-14 – 2015-01-15 (×2): 40 mg via ORAL
  Filled 2015-01-14 (×2): qty 1

## 2015-01-14 MED ORDER — LAMOTRIGINE 25 MG PO TABS
75.0000 mg | ORAL_TABLET | Freq: Every day | ORAL | Status: DC
  Administered 2015-01-14: 75 mg via ORAL
  Filled 2015-01-14: qty 3

## 2015-01-14 NOTE — H&P (Addendum)
[facility] History and Physical  Katie [name] FAO:[id number] DOB: 05/19/1966 DOA: 01/13/2015  Referring physician: ED physician PCP: Lonia BloodGARBA,LAWAL, MD  Specialists:   Chief Complaint: Chest pain  HPI: Katie [name] is a 49 y.o. female with past medical history of hypertension, GERD, bipolar disorder, depression, pulmonary embolism on Xarelto, IBS, right carpal tunnel syndrome, who presents with chest pain.  Her chest pain started at about 5:30 PM yesterday. It is located in the substernal area, constant, dull pain, radiating to the right arm and back. It is associated with mild shortness of breath. It is aggravated by deep breath. Patient has history of pulmonary embolism, and is currently taking Xarelto. She is generally compliant to her medications, but missed one dose of Xarelto 2 days ago. She has mild diarrhea due to IBS, which has not changed in nature. Patient denies fever, chills, cough, dysuria, urgency, frequency, hematuria, skin rashes or leg swelling. No unilateral weakness, numbness or tingling sensations. No vision change or hearing loss. 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  In ED, patient was found to have negative chest x-ray for acute abnormalities. CT angiogram negative for pulmonary embolism. Initial troponin negative. Potassium 2.4. Magnesium 1.6. EKG showed poor R-wave progression and T-wave inversion in lead II. No leukocytosis, temperature normal, bradycardia. Patient is admitted to inpatient for further evaluation and treatment.   Review of Systems: As presented in the history of presenting illness, rest negative.  Where does patient live?  At home Can patient participate in ADLs? Yes  Allergy:  Allergies  Allergen Reactions  . Percocet [Oxycodone-Acetaminophen] Hives  . Vicodin [Hydrocodone-Acetaminophen] Hives    Hives & swelling    Past Medical History  Diagnosis Date  . OA (osteoarthritis)   . HTN  (hypertension)   . Obesity   . Bipolar 1 disorder   . Depression   . Depression   . Back pain   . Anxiety   . Sciatica   . PE (pulmonary embolism)     oct 2014    Past Surgical History  Procedure Laterality Date  . Cholecystectomy  2007  . Cardiac catheterization  2006    normal  . Cesarean section  2002  . Right knee sur  2005  . Tonsillectomy    . Tympanostomy tube placement      Social History:  reports that she has never smoked. She has never used smokeless tobacco. She reports that she drinks alcohol. She reports that she does not use illicit drugs.  Family History:  Family History  Problem Relation Age of Onset  . Heart disease Mother   . Diabetes Father   . Asthma Sister     also had rheumatic fever as a child, died age 47     Prior to Admission medications   Medication Sig Start Date End Date Taking? Authorizing Provider  ALPRAZolam Prudy Feeler(XANAX) 1 MG tablet Take 1 mg by mouth at bedtime.   Yes Historical Provider, MD  aspirin 81 MG chewable tablet Chew 81 mg by mouth every morning.   Yes Historical Provider, MD  furosemide (LASIX) 40 MG tablet Take 1 tablet (40 mg total) by mouth daily. 08/02/13  Yes Zannie CovePreetha Joseph, MD  guaiFENesin (MUCINEX) 600 MG 12 hr tablet Take 600 mg by mouth 2 (two) times daily as needed for cough.   Yes Historical Provider, MD  naproxen sodium (ANAPROX) 220 MG tablet Take 220 mg by mouth once.   Yes Historical Provider,  MD  omeprazole (PRILOSEC) 40 MG capsule Take 40 mg by mouth daily.   Yes Historical Provider, MD  oxyCODONE (OXY IR/ROXICODONE) 5 MG immediate release tablet Take 1 mg by mouth 2 (two) times daily. 12/16/14  Yes Historical Provider, MD  phentermine (ADIPEX-P) 37.5 MG tablet Take 37.5 mg by mouth daily before breakfast.   Yes Historical Provider, MD  Rivaroxaban (XARELTO) 20 MG TABS tablet Take 1 tablet (20 mg total) by mouth daily with supper. Starting 10/18, take  daily 08/23/13  Yes Zannie Cove, MD  diphenhydrAMINE  (BENADRYL) 25 mg capsule Take 1 capsule (25 mg total) by mouth every 6 (six) hours as needed for itching or allergies. Patient not taking: Reported on 01/13/2015 06/20/14   Derwood Kaplan, MD  HYDROcodone-acetaminophen (NORCO/VICODIN) 5-325 MG per tablet Take 1 tablet by mouth every 6 (six) hours as needed. Patient not taking: Reported on 01/13/2015 06/20/14   Derwood Kaplan, MD  ibuprofen (ADVIL,MOTRIN) 600 MG tablet Take 1 tablet (600 mg total) by mouth every 6 (six) hours as needed. Patient not taking: Reported on 01/13/2015 06/20/14   Derwood Kaplan, MD  lamoTRIgine (LAMICTAL) 25 MG tablet Take 75 mg by mouth at bedtime.    Historical Provider, MD    Physical Exam: Filed Vitals:   01/14/15 0237 01/14/15 0300 01/14/15 0400 01/14/15 0430  BP: 117/65 125/77 104/78 116/66  Pulse: 50 46 51 57  Temp:      TempSrc:      Resp: Height:      Weight:      SpO2: 99% 98% 97% 97%   General: Not in acute distress HEENT:       Eyes: PERRL, EOMI, no scleral icterus       ENT: No discharge from the ears and nose, no pharynx injection, no tonsillar enlargement.        Neck: No JVD, no bruit, no mass felt. Cardiac: S1/S2, RRR, No murmurs, No gallops or rubs Pulm: Good air movement bilaterally. Clear to auscultation bilaterally. No rales, wheezing, rhonchi or rubs. Chest wall: there is significant chest wall tenderness to pressing the chest wall.  Abd: Soft, nondistended, nontender, no rebound pain, no organomegaly, BS present Ext: No edema bilaterally. 2+DP/PT pulse bilaterally Musculoskeletal: No joint deformities, erythema, or stiffness, ROM full Skin: No rashes.  Neuro: Alert and oriented X3, cranial nerves II-XII grossly intact, muscle strength 5/5 in all extremeties, sensation to light touch intact.  Psych: Patient is not psychotic, no suicidal or hemocidal ideation.  Labs on Admission:  Basic Metabolic Panel:  Recent Labs Lab 01/13/15 2219 01/14/15 0205  NA 137  --   K 2.3* 2.4*   CL 102  --   CO2 28  --   GLUCOSE 93  --   BUN 6  --   CREATININE 0.61  --   CALCIUM 8.8  --   MG  --  1.6   Liver Function Tests: No results for input(s): AST, ALT, ALKPHOS, BILITOT, PROT, ALBUMIN in the last 168 hours. No results for input(s): LIPASE, AMYLASE in the last 168 hours. No results for input(s): AMMONIA in the last 168 hours. CBC:  Recent Labs Lab 01/13/15 2219  WBC 8.9  HGB 13.4  HCT 39.5  MCV 96.6  PLT 298   Cardiac Enzymes: No results for input(s): CKTOTAL, CKMB, CKMBINDEX, TROPONINI in the last 168 hours.  BNP (last 3 results) No results for input(s): BNP in the last 8760 hours.  ProBNP (last 3 results)  No results for input(s): PROBNP in the last 8760 hours.  CBG: No results for input(s): GLUCAP in the last 168 hours.  Radiological Exams on Admission: Dg Chest 2 View  01/13/2015   CLINICAL DATA:  Centralized chest pain radiating up the neck and down both hands since the in today. Shortness of breath. Previous history of pulmonary embolus.  EXAM: CHEST  2 VIEW  COMPARISON:  07/31/2013  FINDINGS: Shallow inspiration. The heart size and mediastinal contours are within normal limits. Both lungs are clear. The visualized skeletal structures are unremarkable.  IMPRESSION: No active cardiopulmonary disease.   Electronically Signed   By: Burman Nieves M.D.   On: 01/13/2015 22:35   Ct Angio Chest Pe W/cm &/or Wo Cm  01/14/2015   CLINICAL DATA:  Chest pain radiating to the neck and both hands starting at 1700 hr on 01/13/2015. History of hypertension. History of previous pulmonary embolus.  EXAM: CT ANGIOGRAPHY CHEST WITH CONTRAST  TECHNIQUE: Multidetector CT imaging of the chest was performed using the standard protocol during bolus administration of intravenous contrast. Multiplanar CT image reconstructions and MIPs were obtained to evaluate the vascular anatomy.  CONTRAST:  OMNIPAQUE IOHEXOL 350 MG/ML SOLN  COMPARISON:  07/31/2013  FINDINGS: Technically  adequate study with good opacification of the central and segmental pulmonary arteries. No focal filling defects. No evidence of significant pulmonary embolus.  Normal heart size. Normal caliber thoracic aorta. Esophagus is decompressed. No significant lymphadenopathy in the chest.  Evaluation of lungs is limited due to respiratory motion artifact. No focal consolidation or airspace disease is suggested. Airways appear patent. No pleural effusions. No pneumothorax.  Included portions of the upper abdominal organs are grossly unremarkable. Mild degenerative changes in the spine. No destructive bone lesions.  Review of the MIP images confirms the above findings.  IMPRESSION: No evidence of significant pulmonary in it no evidence of active pulmonary disease.   Electronically Signed   By: Burman Nieves M.D.   On: 01/14/2015 01:46    EKG: Independently reviewed. poor R-wave progression and T-wave inversion in lead II.  Assessment/Plan Principal Problem:   Chest pain Active Problems:   Bipolar 1 disorder   Depression   Essential hypertension, benign   GERD (gastroesophageal reflux disease)   Hypokalemia   Dyspnea   Pulmonary embolism  Chest pain: It is atypical. She has reproducible chest wall pain. It is most likely musculoskeletal pain. CT angiogram is negative for PE. Chest x-ray has no pneumonia. Will admit for chest pain rule out.  - will admit to Tele bed for obseration - cycle CE q6 x3 and repeat her EKG  - Nitroglycerin, Morphine, and aspirin - will not start BB due to bradycardia - Risk factor stratification: will check FLP, TSH and A1C  - check UDS and lipase - 2d echo - continue Naproxen and home oxycodone - hold phentermine  Hypokalemia: Potassium 2.4 and magnesium 1.6 on admission. -Repleted potassium by Ed: IV KCl10 mEq X 3 and 80 mEq of KCl -1 gram magnesium sulfate was given a emergency room  Hypertension: -Hold Lasix due to severe hypokalemia -When necessary  hydralazine IV  History of Pulmonary embolism: No recurrent PE by CT angiogram -Continue Xarelto  GERD: -Protonix and Mylanta  Depression and bipolar: Stable. No suicidal or homicidal ideations -Continue home Xanax, Lamictal   DVT ppx: on Xarelto Code Status: Full code Family Communication:     Yes, patient's  dauhgter     at bed side Disposition Plan: Admit to  inpatient   Date of Service 01/14/2015    Lorretta Harp [facility] Pager [phone]  If 7PM-7AM, please contact night-coverage [email or web address] Password [facility] 01/14/2015, 6:14 AM

## 2015-01-14 NOTE — Progress Notes (Signed)
I have seen and assessed the patient and agree with Dr.Niu's assessment and plan. Will check a 2-D echo. Follow.

## 2015-01-14 NOTE — Progress Notes (Signed)
Echocardiogram 2D Echocardiogram has been performed.  Katie [name] 01/14/2015, 12:16 PM

## 2015-01-14 NOTE — Progress Notes (Signed)
UR completed 

## 2015-01-15 LAB — COMPREHENSIVE METABOLIC PANEL
ALK PHOS: 51 U/L (ref 39–117)
ALT: 8 U/L (ref 0–35)
AST: 14 U/L (ref 0–37)
Albumin: 3 g/dL — ABNORMAL LOW (ref 3.5–5.2)
Anion gap: 6 (ref 5–15)
BILIRUBIN TOTAL: 0.9 mg/dL (ref 0.3–1.2)
BUN: 5 mg/dL — AB (ref 6–23)
CO2: 26 mmol/L (ref 19–32)
Calcium: 8 mg/dL — ABNORMAL LOW (ref 8.4–10.5)
Chloride: 105 mmol/L (ref 96–112)
Creatinine, Ser: 0.55 mg/dL (ref 0.50–1.10)
GFR calc Af Amer: >90 mL/min (ref >90)
GFR calc non Af Amer: >90 mL/min (ref >90)
GLUCOSE: 86 mg/dL (ref 70–99)
Potassium: 3.7 mmol/L (ref 3.5–5.1)
Sodium: 137 mmol/L (ref 135–145)
TOTAL PROTEIN: 5.9 g/dL — AB (ref 6.0–8.3)

## 2015-01-15 LAB — TROPONIN I

## 2015-01-15 LAB — HEMOGLOBIN A1C
HEMOGLOBIN A1C: 5.4 % (ref 4.8–5.6)
Mean Plasma Glucose: 108 mg/dL

## 2015-01-15 MED ORDER — POTASSIUM CHLORIDE ER 20 MEQ PO TBCR: 40.0000 meq | EXTENDED_RELEASE_TABLET | Freq: Every day | ORAL | Status: DC

## 2015-01-15 NOTE — Discharge Summary (Signed)
Physician Discharge Summary  Katie [name] Current ZOX:[id number] DOB: 06/17/1966 DOA: 01/13/2015  PCP: Katie [name]  Admit date: 01/13/2015 Discharge date: 01/15/2015  Time spent: 65 minutes  Recommendations for Outpatient Follow-up:  1. Follow-up with Katie [name] in 1 week. On follow-up patient needs a basic metabolic profile done to follow-up on electrolytes and renal function. Patient's musculoskeletal chest pain needs to be reassessed at that time.  Discharge Diagnoses:  Principal Problem:   Chest pain Active Problems:   Bipolar 1 disorder   Depression   Essential hypertension, benign   GERD (gastroesophageal reflux disease)   Hypokalemia   Dyspnea   Pulmonary embolism   Discharge Condition: Stable and improved  Diet recommendation: Regular  Filed Weights   01/13/15 2149 01/14/15 0702  Weight: 107.049 kg (236 lb) 104.509 kg (230 lb 6.4 oz)    History of present illness:  Katie [name] [name] is a 49 y.o. female with past medical history of hypertension, GERD, bipolar disorder, depression, pulmonary embolism on Xarelto, IBS, right carpal tunnel syndrome, who presented with chest pain.  Her chest pain started at about 5:30 PM one day prior to admission. It was located in the substernal area, constant, dull pain, radiating to the right arm and back. It was associated with mild shortness of breath. It was aggravated by deep breath. Patient has history of pulmonary embolism, and currently taking Xarelto. She is generally compliant to her medications, but missed one dose of Xarelto 2 days prior to admission. She has mild diarrhea due to IBS, which has not changed in nature. Patient denied fever, chills, cough, dysuria, urgency, frequency, hematuria, skin rashes or leg swelling. No unilateral weakness, numbness or tingling sensations. No vision change or hearing loss. Patient stated that she did lots of house cleaning work in the morning, one day prior to admission.   In ED, patient was found to  have negative chest x-ray for acute abnormalities. CT angiogram negative for pulmonary embolism. Initial troponin negative. Potassium 2.4. Magnesium 1.6. EKG showed poor R-wave progression and T-wave inversion in lead II. No leukocytosis, temperature normal, bradycardia. Patient was admitted to inpatient for further evaluation and treatment.   Hospital Course:  #1 musculoskeletal chest pain Patient had presented with complaints of chest pain radiating to the right upper extremity. Patient's pain was reproducible. Patient was admitted for further evaluation a chest pain rule out CT angiogram of the chest was done which was negative for PE. Cardiac enzymes were cycled which were negative 3. Patient was placed on ibuprofen 600 mg 3 times daily as needed with clinical improvement. 2-[name] echo was done which showed an EF of 60-65% with no wall more 7 abnormalities. Patient improved clinically and patient be discharged home in stable and improved condition on ibuprofen as needed. Patient is to follow-up with PCP as outpatient.  #2 hypokalemia Patient had presented with hypokalemia. Felt likely secondary to diuretics as patient was on reticulocyte at home. Patient's potassium was repleted. Patient was discharged home on potassium supplementation and is to follow-up with PCP as outpatient.  #3 hypertension Patient's Lasix were held secondary to hypokalemia. Patient was placed on hydralazine as needed. Patient be discharged home back on a home regimen of Lasix with potassium supplementation.  #4 history of pulmonary embolism CT angiogram of the chest which was done was negative for PE. Patient was maintained on a home regimen of Xarelto.  #5 gastroesophageal reflux disease Patient was maintained on a PPI and Mylanta.  #6 depression/bipolar disorder Remained stable. Patient did  not have any suicidal or homicidal ideations. Patient was maintained on home regimen of Lamictal and Xanax.  Procedures:  2-[name]  echo 01/14/2015  CT angio chest 01/14/2015  Chest x-ray 01/13/2015  Consultations:  None  Discharge Exam: Filed Vitals:   01/15/15 0451  BP: 108/60  Pulse: 50  Temp: 97.7 F (36.5 C)  Resp: 20    General: NAD Cardiovascular: RRR. Chest wall less tender to palpation. Respiratory: CTAB  Discharge Instructions   Discharge Instructions    Diet general    Complete by:  As directed      Discharge instructions    Complete by:  As directed   Follow up with Katie Blood, [name] in 1 week.     Increase activity slowly    Complete by:  As directed           Current Discharge Medication List    START taking these medications   Details  Potassium Chloride ER 20 MEQ TBCR Take 40 mEq by mouth daily. Qty: 60 tablet, Refills: 0      CONTINUE these medications which have NOT CHANGED   Details  ALPRAZolam (XANAX) 1 MG tablet Take 1 mg by mouth at bedtime.    aspirin 81 MG chewable tablet Chew 81 mg by mouth every morning.    furosemide (LASIX) 40 MG tablet Take 1 tablet (40 mg total) by mouth daily. Qty: 30 tablet    guaiFENesin (MUCINEX) 600 MG 12 hr tablet Take 600 mg by mouth 2 (two) times daily as needed for cough.    naproxen sodium (ANAPROX) 220 MG tablet Take 220 mg by mouth once.    omeprazole (PRILOSEC) 40 MG capsule Take 40 mg by mouth daily.    oxyCODONE (OXY IR/ROXICODONE) 5 MG immediate release tablet Take 5 mg by mouth 2 (two) times daily.  Refills: 0    phentermine (ADIPEX-P) 37.5 MG tablet Take 37.5 mg by mouth daily before breakfast.    Rivaroxaban (XARELTO) 20 MG TABS tablet Take 1 tablet (20 mg total) by mouth daily with supper. Starting 10/18, take  daily Qty: 30 tablet, Refills: 0    HYDROcodone-acetaminophen (NORCO/VICODIN) 5-325 MG per tablet Take 1 tablet by mouth every 6 (six) hours as needed. Qty: 10 tablet, Refills: 0    ibuprofen (ADVIL,MOTRIN) 600 MG tablet Take 1 tablet (600 mg total) by mouth every 6 (six) hours as needed. Qty: 30  tablet, Refills: 0    lamoTRIgine (LAMICTAL) 25 MG tablet Take 75 mg by mouth at bedtime.      STOP taking these medications     diphenhydrAMINE (BENADRYL) 25 mg capsule        Allergies  Allergen Reactions  . Percocet [Oxycodone-Acetaminophen] Hives  . Vicodin [Hydrocodone-Acetaminophen] Hives    Hives & swelling   Follow-up Information    Follow up with GARBA,LAWAL, [name]. Schedule an appointment as soon as possible for a visit in 1 week.   Specialty:  Internal Medicine   Contact information:   [address] [place] [address] [phone]        The results of significant diagnostics from this hospitalization (including imaging, microbiology, ancillary and laboratory) are listed below for reference.    Significant Diagnostic Studies: Dg Chest 2 View  01/13/2015   CLINICAL DATA:  Centralized chest pain radiating up the neck and down both hands since the in today. Shortness of breath. Previous history of pulmonary embolus.  EXAM: CHEST  2 VIEW  COMPARISON:  07/31/2013  FINDINGS:  Shallow inspiration. The heart size and mediastinal contours are within normal limits. Both lungs are clear. The visualized skeletal structures are unremarkable.  IMPRESSION: No active cardiopulmonary disease.   Electronically Signed   By: Burman Nieves M.[name].   On: 01/13/2015 22:35   Ct Angio Chest Pe W/cm &/or Wo Cm  01/14/2015   CLINICAL DATA:  Chest pain radiating to the neck and both hands starting at 1700 hr on 01/13/2015. History of hypertension. History of previous pulmonary embolus.  EXAM: CT ANGIOGRAPHY CHEST WITH CONTRAST  TECHNIQUE: Multidetector CT imaging of the chest was performed using the standard protocol during bolus administration of intravenous contrast. Multiplanar CT image reconstructions and MIPs were obtained to evaluate the vascular anatomy.  CONTRAST:  OMNIPAQUE IOHEXOL 350 MG/ML SOLN  COMPARISON:  07/31/2013  FINDINGS: Technically adequate study with good  opacification of the central and segmental pulmonary arteries. No focal filling defects. No evidence of significant pulmonary embolus.  Normal heart size. Normal caliber thoracic aorta. Esophagus is decompressed. No significant lymphadenopathy in the chest.  Evaluation of lungs is limited due to respiratory motion artifact. No focal consolidation or airspace disease is suggested. Airways appear patent. No pleural effusions. No pneumothorax.  Included portions of the upper abdominal organs are grossly unremarkable. Mild degenerative changes in the spine. No destructive bone lesions.  Review of the MIP images confirms the above findings.  IMPRESSION: No evidence of significant pulmonary in it no evidence of active pulmonary disease.   Electronically Signed   By: Burman Nieves M.[name].   On: 01/14/2015 01:46    Microbiology: No results found for this or any previous visit (from the past 240 hour(s)).   Labs: Basic Metabolic Panel:  Recent Labs Lab 01/13/15 2219 01/14/15 0205 01/14/15 0916 01/15/15 0553  NA 137  --  137 137  K 2.3* 2.4* 3.3* 3.7  CL 102  --  104 105  CO2 28  --  29 26  GLUCOSE 93  --  79 86  BUN 6  --  5* 5*  CREATININE 0.61  --  0.70 0.55  CALCIUM 8.8  --  8.0* 8.0*  MG  --  1.6 1.9  --    Liver Function Tests:  Recent Labs Lab 01/14/15 0916 01/15/15 0553  AST 17 14  ALT 10 8  ALKPHOS 62 51  BILITOT 1.2 0.9  PROT 7.1 5.9*  ALBUMIN 3.4* 3.0*    Recent Labs Lab 01/14/15 0916  LIPASE 20   No results for input(s): AMMONIA in the last 168 hours. CBC:  Recent Labs Lab 01/13/15 2219 01/14/15 0916  WBC 8.9 7.6  HGB 13.4 13.2  HCT 39.5 39.3  MCV 96.6 97.8  PLT 298 293   Cardiac Enzymes:  Recent Labs Lab 01/14/15 0916 01/14/15 1635 01/14/15 2223  TROPONINI <0.03 <0.03 <0.03   BNP: BNP (last 3 results) No results for input(s): BNP in the last 8760 hours.  ProBNP (last 3 results) No results for input(s): PROBNP in the last 8760  hours.  CBG: No results for input(s): GLUCAP in the last 168 hours.     SignedRamiro Harvest [name] [facility] 01/15/2015, 11:29 AM

## 2015-05-25 ENCOUNTER — Encounter (HOSPITAL_COMMUNITY): Payer: Self-pay

## 2015-05-25 ENCOUNTER — Emergency Department (HOSPITAL_COMMUNITY): Payer: Medicaid Other

## 2015-05-25 ENCOUNTER — Emergency Department (HOSPITAL_COMMUNITY)
Admission: EM | Admit: 2015-05-25 | Discharge: 2015-05-26 | Disposition: A | Discharge status: Home / Self Care | Payer: Medicaid Other | Attending: Emergency Medicine | Admitting: Emergency Medicine

## 2015-05-25 DIAGNOSIS — R0602 Shortness of breath: Secondary | ICD-10-CM | POA: Diagnosis not present

## 2015-05-25 DIAGNOSIS — Z7982 Long term (current) use of aspirin: Secondary | ICD-10-CM | POA: Diagnosis not present

## 2015-05-25 DIAGNOSIS — R110 Nausea: Secondary | ICD-10-CM | POA: Diagnosis not present

## 2015-05-25 DIAGNOSIS — M25511 Pain in right shoulder: Secondary | ICD-10-CM | POA: Diagnosis not present

## 2015-05-25 DIAGNOSIS — F329 Major depressive disorder, single episode, unspecified: Secondary | ICD-10-CM | POA: Insufficient documentation

## 2015-05-25 DIAGNOSIS — I10 Essential (primary) hypertension: Secondary | ICD-10-CM | POA: Insufficient documentation

## 2015-05-25 DIAGNOSIS — Z79899 Other long term (current) drug therapy: Secondary | ICD-10-CM | POA: Insufficient documentation

## 2015-05-25 DIAGNOSIS — M5440 Lumbago with sciatica, unspecified side: Secondary | ICD-10-CM | POA: Diagnosis not present

## 2015-05-25 DIAGNOSIS — R05 Cough: Secondary | ICD-10-CM | POA: Diagnosis not present

## 2015-05-25 DIAGNOSIS — R079 Chest pain, unspecified: Secondary | ICD-10-CM | POA: Insufficient documentation

## 2015-05-25 DIAGNOSIS — Z7901 Long term (current) use of anticoagulants: Secondary | ICD-10-CM | POA: Insufficient documentation

## 2015-05-25 DIAGNOSIS — G5702 Lesion of sciatic nerve, left lower limb: Secondary | ICD-10-CM

## 2015-05-25 DIAGNOSIS — E669 Obesity, unspecified: Secondary | ICD-10-CM | POA: Diagnosis not present

## 2015-05-25 DIAGNOSIS — G8929 Other chronic pain: Secondary | ICD-10-CM | POA: Insufficient documentation

## 2015-05-25 DIAGNOSIS — M1990 Unspecified osteoarthritis, unspecified site: Secondary | ICD-10-CM | POA: Diagnosis not present

## 2015-05-25 DIAGNOSIS — Z86711 Personal history of pulmonary embolism: Secondary | ICD-10-CM | POA: Diagnosis not present

## 2015-05-25 LAB — CBC
HEMATOCRIT: 40.5 % (ref 36.0–46.0)
Hemoglobin: 13.5 g/dL (ref 12.0–15.0)
MCH: 32.5 pg (ref 26.0–34.0)
MCHC: 33.3 g/dL (ref 30.0–36.0)
MCV: 97.6 fL (ref 78.0–100.0)
PLATELETS: 303 10*3/uL (ref 150–400)
RBC: 4.15 MIL/uL (ref 3.87–5.11)
RDW: 13 % (ref 11.5–15.5)
WBC: 7.6 10*3/uL (ref 4.0–10.5)

## 2015-05-25 LAB — BASIC METABOLIC PANEL
Anion gap: 9 (ref 5–15)
BUN: 7 mg/dL (ref 6–20)
CO2: 27 mmol/L (ref 22–32)
Calcium: 9.5 mg/dL (ref 8.9–10.3)
Chloride: 101 mmol/L (ref 101–111)
Creatinine, Ser: 0.72 mg/dL (ref 0.44–1.00)
GFR calc Af Amer: >60 mL/min (ref >60)
GFR calc non Af Amer: >60 mL/min (ref >60)
GLUCOSE: 95 mg/dL (ref 65–99)
Potassium: 3.7 mmol/L (ref 3.5–5.1)
Sodium: 137 mmol/L (ref 135–145)

## 2015-05-25 LAB — D-DIMER, QUANTITATIVE: D-Dimer, Quant: 0.33 ug/mL-FEU (ref 0.00–0.48)

## 2015-05-25 LAB — I-STAT TROPONIN, ED: TROPONIN I, POC: 0 ng/mL (ref 0.00–0.08)

## 2015-05-25 MED ORDER — ASPIRIN 81 MG PO CHEW
324.0000 mg | CHEWABLE_TABLET | Freq: Once | ORAL | Status: AC
  Administered 2015-05-25: 324 mg via ORAL
  Filled 2015-05-25: qty 4

## 2015-05-25 MED ORDER — MORPHINE SULFATE 4 MG/ML IJ SOLN
4.0000 mg | Freq: Once | INTRAMUSCULAR | Status: AC
  Administered 2015-05-25: 4 mg via INTRAVENOUS
  Filled 2015-05-25: qty 1

## 2015-05-25 NOTE — ED Provider Notes (Signed)
CSN: [id number]     Arrival date & time 05/25/15  1957 History   First MD Initiated Contact with Patient 05/25/15 2005     Chief Complaint  Patient presents with  . Chest Pain     (Consider location/radiation/quality/duration/timing/severity/associated sxs/prior Treatment) Patient is a 49 y.o. female presenting with chest pain.  Chest Pain Pain location:  Substernal area Pain quality: aching, sharp and shooting   Pain radiates to:  Upper back, neck, R shoulder and R arm Pain radiates to the back: yes   Pain severity:  Severe Onset quality:  Gradual Duration:  3 hours Timing:  Constant Progression:  Worsening Chronicity:  New Relieved by:  Nothing Worsened by:  Nothing tried (not pleuritic, not positional) Ineffective treatments: oxycodone. Associated symptoms: back pain (sciatica, chronic), cough, nausea and shortness of breath   Associated symptoms: no abdominal pain, no fatigue, no fever, no headache, no numbness, no syncope, not vomiting and no weakness   Risk factors: hypertension and prior DVT/PE   Risk factors: no coronary artery disease, no diabetes mellitus, no high cholesterol, no immobilization, no smoking and no surgery     Past Medical History  Diagnosis Date  . OA (osteoarthritis)   . HTN (hypertension)   . Obesity   . Bipolar 1 disorder   . Depression   . Depression   . Back pain   . Anxiety   . Sciatica   . PE (pulmonary embolism)     oct 2014   Past Surgical History  Procedure Laterality Date  . Cholecystectomy  2007  . Cardiac catheterization  2006    normal  . Cesarean section  2002  . Right knee sur  2005  . Tonsillectomy    . Tympanostomy tube placement     Family History  Problem Relation Age of Onset  . Heart disease Mother   . Diabetes Father   . Asthma Sister     also had rheumatic fever as a child, died age 47   History  Substance Use Topics  . Smoking status: Never Smoker   . Smokeless tobacco: Never Used     Comment: second  hand smoke  . Alcohol Use: Yes     Comment: occ   OB History    No data available     Review of Systems  Constitutional: Negative for fever and fatigue.  HENT: Negative for sore throat.   Eyes: Negative for visual disturbance.  Respiratory: Positive for cough and shortness of breath.   Cardiovascular: Positive for chest pain. Negative for leg swelling and syncope.  Gastrointestinal: Positive for nausea. Negative for vomiting, abdominal pain, diarrhea and constipation.  Genitourinary: Negative for difficulty urinating.  Musculoskeletal: Positive for back pain (sciatica, chronic) and arthralgias. Negative for neck pain.  Skin: Negative for rash.  Neurological: Negative for syncope, weakness, numbness and headaches.      Allergies  Percocet and Vicodin  Home Medications   Prior to Admission medications   Medication Sig Start Date End Date Taking? Authorizing Provider  albuterol (PROVENTIL HFA;VENTOLIN HFA) 108 (90 BASE) MCG/ACT inhaler Inhale 1-2 puffs into the lungs every 6 (six) hours as needed for wheezing or shortness of breath.  05/05/15 05/04/16 Yes Historical Provider, MD  ALPRAZolam Prudy Feeler(XANAX) 1 MG tablet Take 1 mg by mouth at bedtime.   Yes Historical Provider, MD  aspirin 81 MG chewable tablet Chew 81 mg by mouth every morning.   Yes Historical Provider, MD  furosemide (LASIX) 40 MG tablet Take 1 tablet (  40 mg total) by mouth daily. 08/02/13  Yes Zannie Cove, MD  guaiFENesin (MUCINEX) 600 MG 12 hr tablet Take 600 mg by mouth 2 (two) times daily as needed for cough.   Yes Historical Provider, MD  loratadine-pseudoephedrine (CLARITIN-D 12-HOUR) 5-120 MG per tablet Take 1 tablet by mouth 2 (two) times daily as needed for allergies.  05/05/15 05/04/16 Yes Historical Provider, MD  naproxen sodium (ANAPROX) 220 MG tablet Take 440 mg by mouth 2 (two) times daily as needed (pain).    Yes Historical Provider, MD  omeprazole (PRILOSEC) 40 MG capsule Take 40 mg by mouth daily.   Yes  Historical Provider, MD  oxyCODONE (OXY IR/ROXICODONE) 5 MG immediate release tablet Take 5 mg by mouth 2 (two) times daily.  12/16/14  Yes Historical Provider, MD  phentermine (ADIPEX-P) 37.5 MG tablet Take 37.5 mg by mouth daily before breakfast.   Yes Historical Provider, MD  Potassium Chloride ER 20 MEQ TBCR Take 40 mEq by mouth daily. Patient taking differently: Take 20 mEq by mouth 2 (two) times daily.  01/15/15  Yes Rodolph Bong, MD  Rivaroxaban (XARELTO) 20 MG TABS tablet Take 1 tablet (20 mg total) by mouth daily with supper. Starting 10/18, take  daily 08/23/13  Yes Zannie Cove, MD  cyclobenzaprine (FLEXERIL) 10 MG tablet Take 1 tablet (10 mg total) by mouth 2 (two) times daily as needed for muscle spasms. 05/26/15   Alvira Monday, MD   BP 113/72 mmHg  Pulse 69  Temp(Src) 98.5 F (36.9 C) (Oral)  Resp 13  SpO2 100%  LMP 04/26/2015 (Approximate) Physical Exam  Constitutional: She is oriented to person, place, and time. She appears well-developed and well-nourished. No distress.  HENT:  Head: Normocephalic and atraumatic.  Eyes: Conjunctivae and EOM are normal.  Neck: Normal range of motion.  Cardiovascular: Normal rate, regular rhythm, normal heart sounds and intact distal pulses.  Exam reveals no gallop and no friction rub.   No murmur heard. Pulmonary/Chest: Effort normal and breath sounds normal. No respiratory distress. She has no wheezes. She has no rales. She exhibits tenderness.  Abdominal: Soft. She exhibits no distension. There is no tenderness. There is no guarding.  Musculoskeletal: She exhibits tenderness (right shoulder, right upper arm). She exhibits no edema.  Neurological: She is alert and oriented to person, place, and time.  Skin: Skin is warm and dry. No rash noted. She is not diaphoretic. No erythema.  Nursing note and vitals reviewed.   ED Course  Procedures (including critical care time) Labs Review Labs Reviewed  BASIC METABOLIC PANEL   CBC  D-DIMER, QUANTITATIVE (NOT AT [facility])  I-STAT TROPOININ, ED  Rosezena Sensor, ED    Imaging Review Dg Chest 2 View  05/25/2015   CLINICAL DATA:  anterior chest pain with some mid back pain also with sob, nausea, and cough starting today.  EXAM: CHEST - 2 VIEW  COMPARISON:  CT 01/14/2015 and earlier studies  FINDINGS: Lungs are clear. Heart size and mediastinal contours are within normal limits. No effusion. Visualized skeletal structures are unremarkable. Clips in the right upper abdomen.  IMPRESSION: No acute cardiopulmonary disease.   Electronically Signed   By: Corlis Leak M.D.   On: 05/25/2015 21:07     EKG Interpretation   Date/Time:  Tuesday May 25 2015 20:09:04 EDT Ventricular Rate:  71 PR Interval:  153 QRS Duration: 90 QT Interval:  434 QTC Calculation: 472 R Axis:   4 Text Interpretation:  Sinus rhythm Similar to previous  EKGs Confirmed by  [facility] MD, Vaneta [place] ([id number]) on 05/26/2015 1:33:36 AM      MDM   Final diagnoses:  Chest pain, unspecified chest pain type  Sciatic nerve disease, left   49yo female with history of bipolar disorder, hypertension, PE on xarelto presents with concern of chest pain with radiation down the right arm and to neck and concern of left leg sciatica. Pt with similar presentation in March for which she was admitted.  DDx for CP includes dissection/PE/ACS/pneumothorax/pericarditis. An EKG was evaluated by me and showed normal sinus rhythm without acute ST changes consistent with ischemia or pericarditis.  A chest XR was done and evaluated by me and showed no sign of pneumonia, pneumothorax, dissection.  Equal pulses bilaterally.  Pt low risk Wells score, reports compliance with xarelto and has negative DDimer and doubt PE.  Low risk HEART score and 2 negative delta troponins and doubt ACS.  Possible musculoskeletal pain given significant tenderness. In regards to back pain and neck pain, patient has a normal neurologic exam and denies any  urinary retention or overflow incontinence, stool incontinence, saddle anesthesia, fever, IV drug use, trauma, chronic steroid use or immunocompromise and have low suspicion suspicion for cauda equina, fracture, epidural abscess, or vertebral osteomyelitis.  Recommend close follow up with PCP regarding CP and back pain. Patient discharged in stable condition with understanding of reasons to return.     Alvira Monday, MD 05/26/15 1350

## 2015-05-25 NOTE — ED Notes (Signed)
Pt presents with c/o centralized chest pain that started today. Pt reports the pain is shooting into her back and radiating down her right arm. Pt also c/o nausea with the chest pain.

## 2015-05-26 LAB — I-STAT TROPONIN, ED: Troponin i, poc: 0 ng/mL (ref 0.00–0.08)

## 2015-05-26 MED ORDER — CYCLOBENZAPRINE HCL 10 MG PO TABS: 10.0000 mg | ORAL_TABLET | Freq: Two times a day (BID) | ORAL | Status: DC | PRN

## 2015-05-26 MED ORDER — CYCLOBENZAPRINE HCL 10 MG PO TABS
10.0000 mg | ORAL_TABLET | Freq: Once | ORAL | Status: AC
  Administered 2015-05-26: 10 mg via ORAL
  Filled 2015-05-26: qty 1

## 2015-05-26 MED ORDER — OXYCODONE HCL 5 MG PO TABS
5.0000 mg | ORAL_TABLET | Freq: Once | ORAL | Status: AC
  Administered 2015-05-26: 5 mg via ORAL
  Filled 2015-05-26: qty 1

## 2015-05-26 NOTE — Discharge Instructions (Signed)

## 2015-06-13 ENCOUNTER — Encounter (HOSPITAL_COMMUNITY): Payer: Self-pay

## 2015-06-13 ENCOUNTER — Emergency Department (HOSPITAL_COMMUNITY)
Admission: EM | Admit: 2015-06-13 | Discharge: 2015-06-13 | Disposition: A | Discharge status: Home / Self Care | Payer: Medicaid Other | Attending: Emergency Medicine | Admitting: Emergency Medicine

## 2015-06-13 DIAGNOSIS — Z791 Long term (current) use of non-steroidal anti-inflammatories (NSAID): Secondary | ICD-10-CM | POA: Diagnosis not present

## 2015-06-13 DIAGNOSIS — F419 Anxiety disorder, unspecified: Secondary | ICD-10-CM | POA: Insufficient documentation

## 2015-06-13 DIAGNOSIS — M1990 Unspecified osteoarthritis, unspecified site: Secondary | ICD-10-CM | POA: Insufficient documentation

## 2015-06-13 DIAGNOSIS — Z7901 Long term (current) use of anticoagulants: Secondary | ICD-10-CM | POA: Insufficient documentation

## 2015-06-13 DIAGNOSIS — I10 Essential (primary) hypertension: Secondary | ICD-10-CM | POA: Diagnosis not present

## 2015-06-13 DIAGNOSIS — M791 Myalgia, unspecified site: Secondary | ICD-10-CM

## 2015-06-13 DIAGNOSIS — Z79899 Other long term (current) drug therapy: Secondary | ICD-10-CM | POA: Diagnosis not present

## 2015-06-13 DIAGNOSIS — M5432 Sciatica, left side: Secondary | ICD-10-CM | POA: Diagnosis not present

## 2015-06-13 DIAGNOSIS — E669 Obesity, unspecified: Secondary | ICD-10-CM | POA: Insufficient documentation

## 2015-06-13 DIAGNOSIS — M25511 Pain in right shoulder: Secondary | ICD-10-CM | POA: Diagnosis not present

## 2015-06-13 DIAGNOSIS — Z86711 Personal history of pulmonary embolism: Secondary | ICD-10-CM | POA: Insufficient documentation

## 2015-06-13 DIAGNOSIS — Z7952 Long term (current) use of systemic steroids: Secondary | ICD-10-CM | POA: Diagnosis not present

## 2015-06-13 DIAGNOSIS — F319 Bipolar disorder, unspecified: Secondary | ICD-10-CM | POA: Insufficient documentation

## 2015-06-13 DIAGNOSIS — Z7982 Long term (current) use of aspirin: Secondary | ICD-10-CM | POA: Diagnosis not present

## 2015-06-13 MED ORDER — MORPHINE SULFATE 4 MG/ML IJ SOLN
4.0000 mg | Freq: Once | INTRAMUSCULAR | Status: AC
  Administered 2015-06-13: 4 mg via INTRAMUSCULAR
  Filled 2015-06-13: qty 1

## 2015-06-13 MED ORDER — ONDANSETRON 4 MG PO TBDP
4.0000 mg | ORAL_TABLET | Freq: Once | ORAL | Status: AC
  Administered 2015-06-13: 4 mg via ORAL
  Filled 2015-06-13: qty 1

## 2015-06-13 MED ORDER — DIAZEPAM 5 MG PO TABS
5.0000 mg | ORAL_TABLET | Freq: Once | ORAL | Status: AC
  Administered 2015-06-13: 5 mg via ORAL
  Filled 2015-06-13: qty 1

## 2015-06-13 MED ORDER — KETOROLAC TROMETHAMINE 60 MG/2ML IM SOLN
30.0000 mg | Freq: Once | INTRAMUSCULAR | Status: AC
  Administered 2015-06-13: 30 mg via INTRAMUSCULAR
  Filled 2015-06-13: qty 2

## 2015-06-13 NOTE — ED Notes (Addendum)
Pt c/o R shoulder pain radiating into neck and lower back pain radiating into L leg x 2 days.  Pain score 8/10.  Pt reports that she has been dealing w/ this for months.  Pt has an Ortho appointment tomorrow.   Pt reports that she has been taking Oxycodone  and Prednisone for pain.

## 2015-06-13 NOTE — Discharge Instructions (Signed)

## 2015-06-13 NOTE — ED Provider Notes (Signed)
CSN: [id number]     Arrival date & time 06/13/15  1139 History   First MD Initiated Contact with Patient 06/13/15 1401     Chief Complaint  Patient presents with  . Shoulder Pain  . Sciatica     (Consider location/radiation/quality/duration/timing/severity/associated sxs/prior Treatment) Patient is a 49 y.o. female presenting with back pain. The history is provided by the patient.  Back Pain Pain location: R trapezius. Quality:  Stabbing and shooting Radiates to:  Does not radiate Pain severity:  Severe Onset quality:  Sudden Duration:  2 days Timing:  Constant Progression:  Worsening Chronicity:  New Relieved by:  Narcotics Worsened by:  Movement, palpation, bending and twisting Ineffective treatments:  None tried Associated symptoms: no chest pain, no dysuria, no fever and no headaches     Past Medical History  Diagnosis Date  . OA (osteoarthritis)   . HTN (hypertension)   . Obesity   . Bipolar 1 disorder   . Depression   . Depression   . Back pain   . Anxiety   . Sciatica   . PE (pulmonary embolism)     oct 2014   Past Surgical History  Procedure Laterality Date  . Cholecystectomy  2007  . Cardiac catheterization  2006    normal  . Cesarean section  2002  . Right knee sur  2005  . Tonsillectomy    . Tympanostomy tube placement     Family History  Problem Relation Age of Onset  . Heart disease Mother   . Diabetes Father   . Asthma Sister     also had rheumatic fever as a child, died age 83   History  Substance Use Topics  . Smoking status: Never Smoker   . Smokeless tobacco: Never Used     Comment: second hand smoke  . Alcohol Use: Yes     Comment: occ   OB History    No data available     Review of Systems  Constitutional: Negative for fever and chills.  HENT: Negative for congestion and rhinorrhea.   Eyes: Negative for redness and visual disturbance.  Respiratory: Negative for shortness of breath and wheezing.   Cardiovascular: Negative  for chest pain and palpitations.  Gastrointestinal: Negative for nausea and vomiting.  Genitourinary: Negative for dysuria and urgency.  Musculoskeletal: Positive for myalgias, back pain and arthralgias.  Skin: Negative for pallor and wound.  Neurological: Negative for dizziness and headaches.      Allergies  Percocet and Vicodin  Home Medications   Prior to Admission medications   Medication Sig Start Date End Date Taking? Authorizing Provider  albuterol (PROVENTIL HFA;VENTOLIN HFA) 108 (90 BASE) MCG/ACT inhaler Inhale 1-2 puffs into the lungs every 6 (six) hours as needed for wheezing or shortness of breath.  05/05/15 05/04/16 Yes Historical Provider, MD  ALPRAZolam Prudy Feeler) 1 MG tablet Take 1 mg by mouth at bedtime.   Yes Historical Provider, MD  aspirin 81 MG chewable tablet Chew 81 mg by mouth every morning.   Yes Historical Provider, MD  cyclobenzaprine (FLEXERIL) 10 MG tablet Take 1 tablet (10 mg total) by mouth 2 (two) times daily as needed for muscle spasms. 05/26/15  Yes Alvira Monday, MD  furosemide (LASIX) 40 MG tablet Take 1 tablet (40 mg total) by mouth daily. 08/02/13  Yes Zannie Cove, MD  guaiFENesin (MUCINEX) 600 MG 12 hr tablet Take 600 mg by mouth 2 (two) times daily as needed for cough.   Yes Historical Provider, MD  loratadine-pseudoephedrine (CLARITIN-D 12-HOUR) 5-120 MG per tablet Take 1 tablet by mouth 2 (two) times daily as needed for allergies.  05/05/15 05/04/16 Yes Historical Provider, MD  naproxen sodium (ANAPROX) 220 MG tablet Take 880 mg by mouth 2 (two) times daily with a meal.   Yes Historical Provider, MD  omeprazole (PRILOSEC) 40 MG capsule Take 40 mg by mouth daily.   Yes Historical Provider, MD  oxyCODONE (OXY IR/ROXICODONE) 5 MG immediate release tablet Take 5 mg by mouth 2 (two) times daily.  12/16/14  Yes Historical Provider, MD  phentermine (ADIPEX-P) 37.5 MG tablet Take 37.5 mg by mouth daily before breakfast.   Yes Historical Provider, MD  Potassium  Chloride ER 20 MEQ TBCR Take 40 mEq by mouth daily. Patient taking differently: Take 20 mEq by mouth 2 (two) times daily.  01/15/15  Yes Rodolph Bong, MD  predniSONE (DELTASONE) 10 MG tablet Take 10 mg by mouth 2 (two) times daily with a meal.   Yes Historical Provider, MD  Rivaroxaban (XARELTO) 20 MG TABS tablet Take 1 tablet (20 mg total) by mouth daily with supper. Starting 10/18, take 20mg  daily 08/23/13  Yes Zannie Cove, MD   BP 103/58 mmHg  Pulse 82  Temp(Src) 98.2 F (36.8 C) (Oral)  Resp 20  SpO2 98%  LMP 05/26/2015 Physical Exam  Constitutional: She is oriented to person, place, and time. She appears well-developed and well-nourished. No distress.  HENT:  Head: Normocephalic and atraumatic.  Eyes: EOM are normal. Pupils are equal, round, and reactive to light.  Neck: Normal range of motion. Neck supple.  Cardiovascular: Normal rate and regular rhythm.  Exam reveals no gallop and no friction rub.   No murmur heard. Pulmonary/Chest: Effort normal. She has no wheezes. She has no rales.  Abdominal: Soft. She exhibits no distension. There is no tenderness.  Musculoskeletal: She exhibits tenderness. She exhibits no edema.  Focally ttp about the R trapezius musculature.  Recreates all symptoms.  L piriformis ttp.  + slr test on the left  Neurological: She is alert and oriented to person, place, and time.  Skin: Skin is warm and dry. She is not diaphoretic.  Psychiatric: She has a normal mood and affect. Her behavior is normal.    ED Course  Procedures (including critical care time) Labs Review Labs Reviewed - No data to display  Imaging Review No results found.   EKG Interpretation None      MDM   Final diagnoses:  Shoulder pain, acute, right  Muscular pain  Sciatica, left    Patient is a 49 y.o. female who presents with right shoulder pain and left sided sciatica.  This has been an ongoing problem, worsening over past couple of days.  Patient denies recent  injury.  Seen by PCP and started on steroid taper and give oxycodone with no relief.    Pain improved greatly with im toradol, valium and morphine.  D/c home.  7:04 PM:  I have discussed the diagnosis/risks/treatment options with the patient and family and believe the pt to be eligible for discharge home to follow-up with PCP/ortho. We also discussed returning to the ED immediately if new or worsening sx occur. We discussed the sx which are most concerning (e.g., sudden worsening pain, cauda equina sx) that necessitate immediate return. Medications administered to the patient during their visit and any new prescriptions provided to the patient are listed below.  Medications given during this visit Medications  diazepam (VALIUM) tablet 5 mg (5 mg  Oral Given 06/13/15 1444)  morphine 4 MG/ML injection 4 mg (4 mg Intramuscular Given 06/13/15 1444)  ketorolac (TORADOL) injection 30 mg (30 mg Intramuscular Given 06/13/15 1445)  ondansetron (ZOFRAN-ODT) disintegrating tablet 4 mg (4 mg Oral Given 06/13/15 1444)    Discharge Medication List as of 06/13/2015  4:19 PM       The patient appears reasonably screen and/or stabilized for discharge and I doubt any other medical condition or other [facility] requiring further screening, evaluation, or treatment in the ED at this time prior to discharge.    Melene Plan, DO 06/13/15 1904

## 2015-08-15 ENCOUNTER — Encounter (HOSPITAL_COMMUNITY): Payer: Self-pay | Admitting: Emergency Medicine

## 2015-08-15 ENCOUNTER — Emergency Department (HOSPITAL_COMMUNITY)
Admission: EM | Admit: 2015-08-15 | Discharge: 2015-08-15 | Disposition: A | Discharge status: Home / Self Care | Payer: Medicaid Other | Attending: Emergency Medicine | Admitting: Emergency Medicine

## 2015-08-15 DIAGNOSIS — G8929 Other chronic pain: Secondary | ICD-10-CM | POA: Insufficient documentation

## 2015-08-15 DIAGNOSIS — M5441 Lumbago with sciatica, right side: Secondary | ICD-10-CM | POA: Diagnosis not present

## 2015-08-15 DIAGNOSIS — Z7982 Long term (current) use of aspirin: Secondary | ICD-10-CM | POA: Insufficient documentation

## 2015-08-15 DIAGNOSIS — M542 Cervicalgia: Secondary | ICD-10-CM | POA: Insufficient documentation

## 2015-08-15 DIAGNOSIS — F419 Anxiety disorder, unspecified: Secondary | ICD-10-CM | POA: Diagnosis not present

## 2015-08-15 DIAGNOSIS — Z79899 Other long term (current) drug therapy: Secondary | ICD-10-CM | POA: Diagnosis not present

## 2015-08-15 DIAGNOSIS — F319 Bipolar disorder, unspecified: Secondary | ICD-10-CM | POA: Insufficient documentation

## 2015-08-15 DIAGNOSIS — M1990 Unspecified osteoarthritis, unspecified site: Secondary | ICD-10-CM | POA: Insufficient documentation

## 2015-08-15 DIAGNOSIS — Z791 Long term (current) use of non-steroidal anti-inflammatories (NSAID): Secondary | ICD-10-CM | POA: Diagnosis not present

## 2015-08-15 DIAGNOSIS — M545 Low back pain: Secondary | ICD-10-CM | POA: Diagnosis present

## 2015-08-15 DIAGNOSIS — I10 Essential (primary) hypertension: Secondary | ICD-10-CM | POA: Insufficient documentation

## 2015-08-15 DIAGNOSIS — Z7901 Long term (current) use of anticoagulants: Secondary | ICD-10-CM | POA: Insufficient documentation

## 2015-08-15 DIAGNOSIS — Z86711 Personal history of pulmonary embolism: Secondary | ICD-10-CM | POA: Diagnosis not present

## 2015-08-15 HISTORY — DX: Other chronic pain: G89.29

## 2015-08-15 MED ORDER — PREDNISONE 20 MG PO TABS
60.0000 mg | ORAL_TABLET | Freq: Once | ORAL | Status: AC
  Administered 2015-08-15: 60 mg via ORAL
  Filled 2015-08-15: qty 3

## 2015-08-15 MED ORDER — KETOROLAC TROMETHAMINE 60 MG/2ML IM SOLN
60.0000 mg | Freq: Once | INTRAMUSCULAR | Status: AC
  Administered 2015-08-15: 60 mg via INTRAMUSCULAR
  Filled 2015-08-15: qty 2

## 2015-08-15 MED ORDER — PREDNISONE 10 MG PO TABS: ORAL_TABLET | ORAL | Status: DC

## 2015-08-15 NOTE — ED Provider Notes (Signed)
CSN: [id number]     Arrival date & time 08/15/15  3244 History   First MD Initiated Contact with Patient 08/15/15 [phone]     Chief Complaint  Patient presents with  . Back Pain  . Neck Pain     (Consider location/radiation/quality/duration/timing/severity/associated sxs/prior Treatment) HPI   Katie [name] is a 49 y.o. female presents for evaluation. Worsening upper and lower back pain which radiates to her right leg, as her prior sciatica has done. She also has achiness in her right arm. She had an epidural couple weeks ago and has an upcoming, secondary procedure, which she is unable to describe. No fever, chills, weakness or dizziness. Using her usual medications, without relief.   Past Medical History  Diagnosis Date  . OA (osteoarthritis)   . HTN (hypertension)   . Obesity   . Bipolar 1 disorder (HCC)   . Depression   . Depression   . Back pain   . Anxiety   . Sciatica   . PE (pulmonary embolism)     oct 2014  . Chronic pain entered 08/15/2015    Treated with Oxycodone  every month, prescribed by Dr. Mikeal Hawthorne   Past Surgical History  Procedure Laterality Date  . Cholecystectomy  2007  . Cardiac catheterization  2006    normal  . Cesarean section  2002  . Right knee sur  2005  . Tonsillectomy    . Tympanostomy tube placement     Family History  Problem Relation Age of Onset  . Heart disease Mother   . Diabetes Father   . Asthma Sister     also had rheumatic fever as a child, died age 20   Social History  Substance Use Topics  . Smoking status: Never Smoker   . Smokeless tobacco: Never Used     Comment: second hand smoke  . Alcohol Use: Yes     Comment: occ   OB History    No data available     Review of Systems  All other systems reviewed and are negative.     Allergies  Percocet and Vicodin  Home Medications   Prior to Admission medications   Medication Sig Start Date End Date Taking? Authorizing Provider  albuterol (PROVENTIL HFA;VENTOLIN  HFA) 108 (90 BASE) MCG/ACT inhaler Inhale 1-2 puffs into the lungs every 6 (six) hours as needed for wheezing or shortness of breath.  05/05/15 05/04/16 Yes Historical Provider, MD  ALPRAZolam Prudy Feeler) 1 MG tablet Take 1 mg by mouth at bedtime.   Yes Historical Provider, MD  aspirin 81 MG chewable tablet Chew 81 mg by mouth every morning.   Yes Historical Provider, MD  furosemide (LASIX) 40 MG tablet Take 1 tablet (40 mg total) by mouth daily. 08/02/13  Yes Zannie Cove, MD  guaiFENesin (MUCINEX) 600 MG 12 hr tablet Take 600 mg by mouth 2 (two) times daily as needed for cough.   Yes Historical Provider, MD  naproxen sodium (ANAPROX) 220 MG tablet Take 220-880 mg by mouth 2 (two) times daily with a meal.    Yes Historical Provider, MD  omeprazole (PRILOSEC) 40 MG capsule Take 40 mg by mouth daily.   Yes Historical Provider, MD  oxyCODONE (OXY IR/ROXICODONE) 5 MG immediate release tablet Take 5 mg by mouth 2 (two) times daily.  12/16/14  Yes Historical Provider, MD  phentermine (ADIPEX-P) 37.5 MG tablet Take 37.5 mg by mouth daily before breakfast.   Yes Historical Provider, MD  Potassium Chloride ER 20 MEQ  TBCR Take 40 mEq by mouth daily. Patient taking differently: Take 20 mEq by mouth 2 (two) times daily.  01/15/15  Yes Rodolph Bong, MD  cyclobenzaprine (FLEXERIL) 10 MG tablet Take 1 tablet (10 mg total) by mouth 2 (two) times daily as needed for muscle spasms. Patient not taking: Reported on 08/15/2015 05/26/15   Alvira Monday, MD  Rivaroxaban (XARELTO) 20 MG TABS tablet Take 1 tablet (20 mg total) by mouth daily with supper. Starting 10/18, take  daily 08/23/13   Zannie Cove, MD   BP 116/78 mmHg  Pulse 78  Temp(Src) 98.2 F (36.8 C) (Oral)  Resp 20  SpO2 99% Physical Exam  Constitutional: She is oriented to person, place, and time. She appears well-developed and well-nourished.  HENT:  Head: Normocephalic and atraumatic.  Right Ear: External ear normal.  Left Ear: External ear  normal.  Eyes: Conjunctivae and EOM are normal. Pupils are equal, round, and reactive to light.  Neck: Normal range of motion and phonation normal. Neck supple.  Cardiovascular: Normal rate, regular rhythm and normal heart sounds.   Pulmonary/Chest: Effort normal and breath sounds normal. She exhibits no bony tenderness.  Abdominal: Soft. There is no tenderness.  Musculoskeletal:  Moderate tenderness of cervical, thoracic and lumbar spines to light palpation. Associated paravertebral tenderness of thoracic and lumbar spine as well.  Neurological: She is alert and oriented to person, place, and time. No cranial nerve deficit or sensory deficit. She exhibits normal muscle tone. Coordination normal.  Skin: Skin is warm, dry and intact.  Psychiatric: She has a normal mood and affect. Her behavior is normal. Judgment and thought content normal.  Nursing note and vitals reviewed.   ED Course  Procedures (including critical care time)  Medications  ketorolac (TORADOL) injection 60 mg (60 mg Intramuscular Given 08/15/15 0851)    Patient Vitals for the past 24 hrs:  BP Temp Temp src Pulse Resp SpO2  08/15/15 0827 116/78 mmHg 98.2 F (36.8 C) Oral 78 20 99 %    11:01 AM Reevaluation with update and discussion. After initial assessment and treatment, an updated evaluation reveals patient is more comfortable after the Toradol injection. She states that she has her usual medicines at home for pain, oxycodone, which she takes chronically. Findings discussed with the patient, all questions were answered. Eulonda Andalon L    Labs Review Labs Reviewed - No data to display  Imaging Review No results found. I have personally reviewed and evaluated these images and lab results as part of my medical decision-making.   EKG Interpretation None      MDM   Final diagnoses:  Right-sided low back pain with right-sided sciatica    Chronic pain, with sciatica, and current treatment by orthopedics.  Upcoming procedure this week. Doubt spinal stenosis, lumbar myelopathy, discitis, or metabolic instability.  Nursing Notes Reviewed/ Care Coordinated Applicable Imaging Reviewed Interpretation of Laboratory Data incorporated into ED treatment  The patient appears reasonably screened and/or stabilized for discharge and I doubt any other medical condition or other [facility] requiring further screening, evaluation, or treatment in the ED at this time prior to discharge.  Plan: Home Medications- Prednisone taper; Home Treatments- rest; return here if the recommended treatment, does not improve the symptoms; Recommended follow up-  PCP and Ortho prn   Mancel Bale, MD 08/15/15 1105

## 2015-08-15 NOTE — ED Notes (Signed)
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 Had an injection to mid back 1 month ago. Is scheduled for another to neck on Friday. Pain uncontrolled at home. Denies any loss of urine or bowl. Has used multiple things for pain with very minimal relief. Has to have a sitting position for comfort. Denies any new injury.

## 2015-08-15 NOTE — Discharge Instructions (Signed)
Chronic Back Pain ° When back pain lasts longer than 3 months, it is called chronic back pain. People with chronic back pain often go through certain periods that are more intense (flare-ups).  °CAUSES °Chronic back pain can be caused by wear and tear (degeneration) on different structures in your back. These structures include: °· The bones of your spine (vertebrae) and the joints surrounding your spinal cord and nerve roots (facets). °· The strong, fibrous tissues that connect your vertebrae (ligaments). °Degeneration of these structures may result in pressure on your nerves. This can lead to constant pain. °HOME CARE INSTRUCTIONS °· Avoid bending, heavy lifting, prolonged sitting, and activities which make the problem worse. °· Take brief periods of rest throughout the day to reduce your pain. Lying down or standing usually is better than sitting while you are resting. °· Take over-the-counter or prescription medicines only as directed by your caregiver. °SEEK IMMEDIATE MEDICAL CARE IF:  °· You have weakness or numbness in one of your legs or feet. °· You have trouble controlling your bladder or bowels. °· You have nausea, vomiting, abdominal pain, shortness of breath, or fainting. °  °This information is not intended to replace advice given to you by your health care provider. Make sure you discuss any questions you have with your health care provider. °  °Document Released: 11/30/2004 Document Revised: 01/15/2012 Document Reviewed: 04/12/2015 °Elsevier Interactive Patient Education ©2016 [employer] ° °Sciatica °Sciatica is pain, weakness, numbness, or tingling along the path of the sciatic nerve. The nerve starts in the lower back and runs down the back of each leg. The nerve controls the muscles in the lower leg and in the back of the knee, while also providing sensation to the back of the thigh, lower leg, and the sole of your foot. Sciatica is a symptom of another medical condition. For instance, nerve  damage or certain conditions, such as a herniated disk or bone spur on the spine, pinch or put pressure on the sciatic nerve. This causes the pain, weakness, or other sensations normally associated with sciatica. Generally, sciatica only affects one side of the body. °CAUSES  °· Herniated or slipped disc. °· Degenerative disk disease. °· A pain disorder involving the narrow muscle in the buttocks (piriformis syndrome). °· Pelvic injury or fracture. °· Pregnancy. °· Tumor (rare). °SYMPTOMS  °Symptoms can vary from mild to very severe. The symptoms usually travel from the low back to the buttocks and down the back of the leg. Symptoms can include: °· Mild tingling or dull aches in the lower back, leg, or hip. °· Numbness in the back of the calf or sole of the foot. °· Burning sensations in the lower back, leg, or hip. °· Sharp pains in the lower back, leg, or hip. °· Leg weakness. °· Severe back pain inhibiting movement. °These symptoms may get worse with coughing, sneezing, laughing, or prolonged sitting or standing. Also, being overweight may worsen symptoms. °DIAGNOSIS  °Your caregiver will perform a physical exam to look for common symptoms of sciatica. He or she may ask you to do certain movements or activities that would trigger sciatic nerve pain. Other tests may be performed to find the cause of the sciatica. These may include: °· Blood tests. °· X-rays. °· Imaging tests, such as an MRI or CT scan. °TREATMENT  °Treatment is directed at the cause of the sciatic pain. Sometimes, treatment is not necessary and the pain and discomfort goes away on its own. If treatment is needed, your   caregiver may suggest: °· Over-the-counter medicines to relieve pain. °· Prescription medicines, such as anti-inflammatory medicine, muscle relaxants, or narcotics. °· Applying heat or ice to the painful area. °· Steroid injections to lessen pain, irritation, and inflammation around the nerve. °· Reducing activity during periods of  pain. °· Exercising and stretching to strengthen your abdomen and improve flexibility of your spine. Your caregiver may suggest losing weight if the extra weight makes the back pain worse. °· Physical therapy. °· Surgery to eliminate what is pressing or pinching the nerve, such as a bone spur or part of a herniated disk. °HOME CARE INSTRUCTIONS  °· Only take over-the-counter or prescription medicines for pain or discomfort as directed by your caregiver. °· Apply ice to the affected area for 20 minutes, 3-4 times a day for the first 48-72 hours. Then try heat in the same way. °· Exercise, stretch, or perform your usual activities if these do not aggravate your pain. °· Attend physical therapy sessions as directed by your caregiver. °· Keep all follow-up appointments as directed by your caregiver. °· Do not wear high heels or shoes that do not provide proper support. °· Check your mattress to see if it is too soft. A firm mattress may lessen your pain and discomfort. °SEEK IMMEDIATE MEDICAL CARE IF:  °· You lose control of your bowel or bladder (incontinence). °· You have increasing weakness in the lower back, pelvis, buttocks, or legs. °· You have redness or swelling of your back. °· You have a burning sensation when you urinate. °· You have pain that gets worse when you lie down or awakens you at night. °· Your pain is worse than you have experienced in the past. °· Your pain is lasting longer than 4 weeks. °· You are suddenly losing weight without reason. °MAKE SURE YOU: °· Understand these instructions. °· Will watch your condition. °· Will get help right away if you are not doing well or get worse. °  °This information is not intended to replace advice given to you by your health care provider. Make sure you discuss any questions you have with your health care provider. °  °Document Released: 10/17/2001 Document Revised: 07/14/2015 Document Reviewed: 03/03/2012 °Elsevier Interactive Patient Education ©2016  [employer] ° °

## 2015-09-09 ENCOUNTER — Encounter (HOSPITAL_COMMUNITY): Payer: Self-pay | Admitting: Emergency Medicine

## 2015-09-09 ENCOUNTER — Emergency Department (HOSPITAL_COMMUNITY)
Admission: EM | Admit: 2015-09-09 | Discharge: 2015-09-09 | Disposition: A | Discharge status: Home / Self Care | Payer: Medicaid Other | Attending: Emergency Medicine | Admitting: Emergency Medicine

## 2015-09-09 DIAGNOSIS — M5430 Sciatica, unspecified side: Secondary | ICD-10-CM | POA: Insufficient documentation

## 2015-09-09 DIAGNOSIS — M1990 Unspecified osteoarthritis, unspecified site: Secondary | ICD-10-CM | POA: Insufficient documentation

## 2015-09-09 DIAGNOSIS — M546 Pain in thoracic spine: Secondary | ICD-10-CM | POA: Insufficient documentation

## 2015-09-09 DIAGNOSIS — E669 Obesity, unspecified: Secondary | ICD-10-CM | POA: Diagnosis not present

## 2015-09-09 DIAGNOSIS — Z79899 Other long term (current) drug therapy: Secondary | ICD-10-CM | POA: Diagnosis not present

## 2015-09-09 DIAGNOSIS — M545 Low back pain: Secondary | ICD-10-CM | POA: Diagnosis not present

## 2015-09-09 DIAGNOSIS — I10 Essential (primary) hypertension: Secondary | ICD-10-CM | POA: Diagnosis not present

## 2015-09-09 DIAGNOSIS — M542 Cervicalgia: Secondary | ICD-10-CM | POA: Insufficient documentation

## 2015-09-09 DIAGNOSIS — G8929 Other chronic pain: Secondary | ICD-10-CM | POA: Insufficient documentation

## 2015-09-09 DIAGNOSIS — Z86711 Personal history of pulmonary embolism: Secondary | ICD-10-CM | POA: Insufficient documentation

## 2015-09-09 DIAGNOSIS — M549 Dorsalgia, unspecified: Secondary | ICD-10-CM

## 2015-09-09 DIAGNOSIS — Z7982 Long term (current) use of aspirin: Secondary | ICD-10-CM | POA: Insufficient documentation

## 2015-09-09 DIAGNOSIS — Z791 Long term (current) use of non-steroidal anti-inflammatories (NSAID): Secondary | ICD-10-CM | POA: Diagnosis not present

## 2015-09-09 DIAGNOSIS — F419 Anxiety disorder, unspecified: Secondary | ICD-10-CM | POA: Diagnosis not present

## 2015-09-09 DIAGNOSIS — F329 Major depressive disorder, single episode, unspecified: Secondary | ICD-10-CM | POA: Diagnosis not present

## 2015-09-09 MED ORDER — KETOROLAC TROMETHAMINE 60 MG/2ML IM SOLN
60.0000 mg | Freq: Once | INTRAMUSCULAR | Status: AC
  Administered 2015-09-09: 60 mg via INTRAMUSCULAR
  Filled 2015-09-09: qty 2

## 2015-09-09 NOTE — ED Notes (Signed)
Patient was alert, oriented and stable upon discharge. RN went over AVS and patient had no further questions.  

## 2015-09-09 NOTE — ED Provider Notes (Signed)
CSN: [id number]     Arrival date & time 09/09/15  0020 History   First MD Initiated Contact with Patient 09/09/15 0058     Chief Complaint  Patient presents with  . Back Pain     (Consider location/radiation/quality/duration/timing/severity/associated sxs/prior Treatment) HPI   49 year old obese female history of bipolar, recurrent back pain, sciatica, PE currently on Xarelto presenting for evaluation of neck and back pain. Patient report she has had persistent neck and back pain ongoing for the past 4-5 months. Her pain is sharp throbbing achy, constant, worsened with movement. She endorse shooting pain and tingling sensation down all 4 extremities. She has been seen and evaluated for this at [facility] and has had several epidural shots without adequate relief. She has been using ice pack, home remedies without adequate relief. She is scheduled to follow-up with Dr. Farris Has (orthopedist/neurosurgeon) tomorrow for further care. She is here requesting for pain management. No complaint of fever, rash, new focal numbness or weakness, chest pain shortness of breath, abdominal pain hematuria or dysuria. No headache. Patient is drowsy.  Past Medical History  Diagnosis Date  . OA (osteoarthritis)   . HTN (hypertension)   . Obesity   . Bipolar 1 disorder (HCC)   . Depression   . Depression   . Back pain   . Anxiety   . Sciatica   . PE (pulmonary embolism)     oct 2014  . Chronic pain entered 08/15/2015    Treated with Oxycodone  every month, prescribed by Dr. Mikeal Hawthorne   Past Surgical History  Procedure Laterality Date  . Cholecystectomy  2007  . Cardiac catheterization  2006    normal  . Cesarean section  2002  . Right knee sur  2005  . Tonsillectomy    . Tympanostomy tube placement     Family History  Problem Relation Age of Onset  . Heart disease Mother   . Diabetes Father   . Asthma Sister     also had rheumatic fever as a child, died age 40   Social History   Substance Use Topics  . Smoking status: Never Smoker   . Smokeless tobacco: Never Used     Comment: second hand smoke  . Alcohol Use: Yes     Comment: occ   OB History    No data available     Review of Systems  Constitutional: Negative for fever.  Musculoskeletal: Positive for back pain and neck pain.  Skin: Negative for rash and wound.  Neurological: Negative for headaches.      Allergies  Percocet and Vicodin  Home Medications   Prior to Admission medications   Medication Sig Start Date End Date Taking? Authorizing Provider  albuterol (PROVENTIL HFA;VENTOLIN HFA) 108 (90 BASE) MCG/ACT inhaler Inhale 1-2 puffs into the lungs every 6 (six) hours as needed for wheezing or shortness of breath.  05/05/15 05/04/16  Historical Provider, MD  ALPRAZolam Prudy Feeler) 1 MG tablet Take 1 mg by mouth at bedtime.    Historical Provider, MD  aspirin 81 MG chewable tablet Chew 81 mg by mouth every morning.    Historical Provider, MD  furosemide (LASIX) 40 MG tablet Take 1 tablet (40 mg total) by mouth daily. 08/02/13   Zannie Cove, MD  guaiFENesin (MUCINEX) 600 MG 12 hr tablet Take 600 mg by mouth 2 (two) times daily as needed for cough.    Historical Provider, MD  naproxen sodium (ANAPROX) 220 MG tablet Take 220-880 mg by mouth 2 (  two) times daily with a meal.     Historical Provider, MD  omeprazole (PRILOSEC) 40 MG capsule Take 40 mg by mouth daily.    Historical Provider, MD  oxyCODONE (OXY IR/ROXICODONE) 5 MG immediate release tablet Take 5 mg by mouth 2 (two) times daily.  12/16/14   Historical Provider, MD  phentermine (ADIPEX-P) 37.5 MG tablet Take 37.5 mg by mouth daily before breakfast.    Historical Provider, MD  Potassium Chloride ER 20 MEQ TBCR Take 40 mEq by mouth daily. Patient taking differently: Take 20 mEq by mouth 2 (two) times daily.  01/15/15   Rodolph Bonganiel Thompson V, MD  predniSONE (DELTASONE) 10 MG tablet Take q day 6,5,4,3,2,1 08/15/15   Mancel BaleElliott Wentz, MD  Rivaroxaban (XARELTO)  20 MG TABS tablet Take 1 tablet (20 mg total) by mouth daily with supper. Starting 10/18, take 20mg  daily 08/23/13   Zannie CovePreetha Joseph, MD   BP 110/56 mmHg  Pulse 80  Temp(Src) 98.4 F (36.9 C) (Oral)  Resp 14  SpO2 100% Physical Exam  Constitutional: She appears well-developed and well-nourished. No distress.  Moderately obese African-American female, drowsy in appearance but able to answer questions appropriately.  HENT:  Head: Normocephalic and atraumatic.  Eyes: Conjunctivae are normal.  Neck: Normal range of motion. Neck supple.  No nuchal rigidity.  Cardiovascular: Intact distal pulses.   Musculoskeletal: She exhibits tenderness (Diffuse tenderness along the entire spine with light palpation. No overlying skin changes. Positive straight leg raise bilaterally but intact patellar deep tendon reflex and ambulate without assistance.).  Neurological: She is alert. She has normal reflexes.  Skin: No rash noted.  Psychiatric: She has a normal mood and affect.  Nursing note and vitals reviewed.   ED Course  Procedures (including critical care time)  Patient with chronic neck and back pain. No acute emergent medical condition identified. No signs to suggest infection. She is following up with her orthopedist or neurosurgeon tomorrow therefore her dose of Toradol was given in the ED today. Patient is able to ambulate.   MDM   Final diagnoses:  Chronic back pain greater than 3 months duration    BP 110/56 mmHg  Pulse 80  Temp(Src) 98.4 F (36.9 C) (Oral)  Resp 14  SpO2 100%     Fayrene HelperBowie Imanuel Pruiett, PA-C 09/09/15 0142  April Palumbo, MD 09/09/15 [phone]

## 2015-09-09 NOTE — Discharge Instructions (Signed)

## 2015-09-09 NOTE — ED Notes (Signed)
Pt c/o upper back x 3-4 months, recent epidural tx for same. Lower L "sciatica" pain radiating down L leg. Pt appears very drowsy, very slow to answer questions.

## 2015-09-16 ENCOUNTER — Other Ambulatory Visit: Payer: Self-pay

## 2015-09-16 DIAGNOSIS — Z1231 Encounter for screening mammogram for malignant neoplasm of breast: Secondary | ICD-10-CM

## 2015-10-14 ENCOUNTER — Ambulatory Visit: Payer: Medicaid Other

## 2015-10-30 ENCOUNTER — Emergency Department (HOSPITAL_COMMUNITY): Payer: Medicaid Other

## 2015-10-30 ENCOUNTER — Emergency Department (HOSPITAL_COMMUNITY)
Admission: EM | Admit: 2015-10-30 | Discharge: 2015-10-30 | Disposition: A | Discharge status: Home / Self Care | Payer: Medicaid Other | Attending: Emergency Medicine | Admitting: Emergency Medicine

## 2015-10-30 ENCOUNTER — Encounter (HOSPITAL_COMMUNITY): Payer: Self-pay

## 2015-10-30 DIAGNOSIS — R5383 Other fatigue: Secondary | ICD-10-CM | POA: Diagnosis not present

## 2015-10-30 DIAGNOSIS — Z79899 Other long term (current) drug therapy: Secondary | ICD-10-CM | POA: Insufficient documentation

## 2015-10-30 DIAGNOSIS — M1990 Unspecified osteoarthritis, unspecified site: Secondary | ICD-10-CM | POA: Insufficient documentation

## 2015-10-30 DIAGNOSIS — M549 Dorsalgia, unspecified: Secondary | ICD-10-CM | POA: Diagnosis not present

## 2015-10-30 DIAGNOSIS — G8929 Other chronic pain: Secondary | ICD-10-CM | POA: Diagnosis not present

## 2015-10-30 DIAGNOSIS — Z86711 Personal history of pulmonary embolism: Secondary | ICD-10-CM | POA: Insufficient documentation

## 2015-10-30 DIAGNOSIS — R0789 Other chest pain: Secondary | ICD-10-CM | POA: Diagnosis not present

## 2015-10-30 DIAGNOSIS — Z9889 Other specified postprocedural states: Secondary | ICD-10-CM | POA: Diagnosis not present

## 2015-10-30 DIAGNOSIS — Z7901 Long term (current) use of anticoagulants: Secondary | ICD-10-CM | POA: Diagnosis not present

## 2015-10-30 DIAGNOSIS — Z7982 Long term (current) use of aspirin: Secondary | ICD-10-CM | POA: Insufficient documentation

## 2015-10-30 DIAGNOSIS — M5430 Sciatica, unspecified side: Secondary | ICD-10-CM | POA: Insufficient documentation

## 2015-10-30 DIAGNOSIS — E669 Obesity, unspecified: Secondary | ICD-10-CM | POA: Diagnosis not present

## 2015-10-30 DIAGNOSIS — F419 Anxiety disorder, unspecified: Secondary | ICD-10-CM | POA: Insufficient documentation

## 2015-10-30 DIAGNOSIS — R55 Syncope and collapse: Secondary | ICD-10-CM | POA: Insufficient documentation

## 2015-10-30 DIAGNOSIS — R05 Cough: Secondary | ICD-10-CM | POA: Insufficient documentation

## 2015-10-30 DIAGNOSIS — R059 Cough, unspecified: Secondary | ICD-10-CM

## 2015-10-30 LAB — I-STAT TROPONIN, ED: Troponin i, poc: 0 ng/mL (ref 0.00–0.08)

## 2015-10-30 LAB — BASIC METABOLIC PANEL
ANION GAP: 7 (ref 5–15)
BUN: 12 mg/dL (ref 6–20)
CHLORIDE: 105 mmol/L (ref 101–111)
CO2: 27 mmol/L (ref 22–32)
CREATININE: 0.85 mg/dL (ref 0.44–1.00)
Calcium: 8.7 mg/dL — ABNORMAL LOW (ref 8.9–10.3)
GFR calc non Af Amer: >60 mL/min (ref >60)
Glucose, Bld: 131 mg/dL — ABNORMAL HIGH (ref 65–99)
POTASSIUM: 3.8 mmol/L (ref 3.5–5.1)
Sodium: 139 mmol/L (ref 135–145)

## 2015-10-30 LAB — CBC
HCT: 38.8 % (ref 36.0–46.0)
Hemoglobin: 12.5 g/dL (ref 12.0–15.0)
MCH: 30.6 pg (ref 26.0–34.0)
MCHC: 32.2 g/dL (ref 30.0–36.0)
MCV: 94.9 fL (ref 78.0–100.0)
PLATELETS: 247 10*3/uL (ref 150–400)
RBC: 4.09 MIL/uL (ref 3.87–5.11)
RDW: 12.8 % (ref 11.5–15.5)
WBC: 5.8 10*3/uL (ref 4.0–10.5)

## 2015-10-30 MED ORDER — SODIUM CHLORIDE 0.9 % IV BOLUS (SEPSIS)
1000.0000 mL | Freq: Once | INTRAVENOUS | Status: AC
  Administered 2015-10-30: 1000 mL via INTRAVENOUS

## 2015-10-30 NOTE — ED Notes (Signed)
MD at bedside. 

## 2015-10-30 NOTE — Discharge Instructions (Signed)
Chest Wall Pain °Chest wall pain is pain in or around the bones and muscles of your chest. Sometimes, an injury causes this pain. Sometimes, the cause may not be known. This pain may take several weeks or longer to get better. °HOME CARE INSTRUCTIONS  °Pay attention to any changes in your symptoms. Take these actions to help with your pain:  °· Rest as told by your health care provider.   °· Avoid activities that cause pain. These include any activities that use your chest muscles or your abdominal and side muscles to lift heavy items.    °· If directed, apply ice to the painful area: °· Put ice in a plastic bag. °· Place a towel between your skin and the bag. °· Leave the ice on for 20 minutes, 2-3 times per day. °· Take over-the-counter and prescription medicines only as told by your health care provider. °· Do not use tobacco products, including cigarettes, chewing tobacco, and e-cigarettes. If you need help quitting, ask your health care provider. °· Keep all follow-up visits as told by your health care provider. This is important. °SEEK MEDICAL CARE IF: °· You have a fever. °· Your chest pain becomes worse. °· You have new symptoms. °SEEK IMMEDIATE MEDICAL CARE IF: °· You have nausea or vomiting. °· You feel sweaty or light-headed. °· You have a cough with phlegm (sputum) or you cough up blood. °· You develop shortness of breath. °  °This information is not intended to replace advice given to you by your health care provider. Make sure you discuss any questions you have with your health care provider. °  °Document Released: 10/23/2005 Document Revised: 07/14/2015 Document Reviewed: 01/18/2015 °Elsevier Interactive Patient Education ©2016 [employer] ° °Cough, Adult °Coughing is a reflex that clears your throat and your airways. Coughing helps to heal and protect your lungs. It is normal to cough occasionally, but a cough that happens with other symptoms or lasts a long time may be a sign of a condition that  needs treatment. A cough may last only 2-3 weeks (acute), or it may last longer than 8 weeks (chronic). °CAUSES °Coughing is commonly caused by: °· Breathing in substances that irritate your lungs. °· A viral or bacterial respiratory infection. °· Allergies. °· Asthma. °· Postnasal drip. °· Smoking. °· Acid backing up from the stomach into the esophagus (gastroesophageal reflux). °· Certain medicines. °· Chronic lung problems, including COPD (or rarely, lung cancer). °· Other medical conditions such as heart failure. °HOME CARE INSTRUCTIONS  °Pay attention to any changes in your symptoms. Take these actions to help with your discomfort: °· Take medicines only as told by your health care provider. °¨ If you were prescribed an antibiotic medicine, take it as told by your health care provider. Do not stop taking the antibiotic even if you start to feel better. °¨ Talk with your health care provider before you take a cough suppressant medicine. °· Drink enough fluid to keep your urine clear or pale yellow. °· If the air is dry, use a cold steam vaporizer or humidifier in your bedroom or your home to help loosen secretions. °· Avoid anything that causes you to cough at work or at home. °· If your cough is worse at night, try sleeping in a semi-upright position. °· Avoid cigarette smoke. If you smoke, quit smoking. If you need help quitting, ask your health care provider. °· Avoid caffeine. °· Avoid alcohol. °· Rest as needed. °SEEK MEDICAL CARE IF:  °· You have new   symptoms. °· You cough up pus. °· Your cough does not get better after 2-3 weeks, or your cough gets worse. °· You cannot control your cough with suppressant medicines and you are losing sleep. °· You develop pain that is getting worse or pain that is not controlled with pain medicines. °· You have a fever. °· You have unexplained weight loss. °· You have night sweats. °SEEK IMMEDIATE MEDICAL CARE IF: °· You cough up blood. °· You have difficulty  breathing. °· Your heartbeat is very fast. °  °This information is not intended to replace advice given to you by your health care provider. Make sure you discuss any questions you have with your health care provider. °  °Document Released: 04/21/2011 Document Revised: 07/14/2015 Document Reviewed: 12/30/2014 °Elsevier Interactive Patient Education ©2016 Elsevier Inc. ° °

## 2015-10-30 NOTE — ED Notes (Signed)
Ambulated pt in hallway. Tolerated well.

## 2015-10-30 NOTE — ED Provider Notes (Signed)
CSN: [id number]     Arrival date & time 10/30/15  1016 History   First MD Initiated Contact with Patient 10/30/15 1019     Chief Complaint  Patient presents with  . Bradycardia  . Chest Pain  . Fatigue     (Consider location/radiation/quality/duration/timing/severity/associated sxs/prior Treatment) Patient is a 49 y.o. female presenting with chest pain. The history is provided by the patient.  Chest Pain Pain location:  Substernal area Pain quality: pressure   Pain radiates to:  Does not radiate Pain radiates to the back: no   Pain severity:  Moderate Onset quality:  Sudden Duration:  1 hour Timing:  Constant Progression:  Unchanged Chronicity:  Recurrent Context: at rest   Context comment:  With 2 days of preceding cough Relieved by:  Nothing Worsened by:  Nothing tried Ineffective treatments:  None tried Associated symptoms: anxiety, back pain, cough (over last 2 days) and near-syncope (felt weak and slumped to floor being helped by daughter to shower)   Associated symptoms: no fever and no shortness of breath   Risk factors: hypertension, obesity and prior DVT/PE (on xarelto)   Risk factors: no coronary artery disease     Past Medical History  Diagnosis Date  . OA (osteoarthritis)   . HTN (hypertension)   . Obesity   . Bipolar 1 disorder (HCC)   . Depression   . Depression   . Back pain   . Anxiety   . Sciatica   . PE (pulmonary embolism)     oct 2014  . Chronic pain entered 08/15/2015    Treated with Oxycodone  every month, prescribed by Dr. Mikeal Hawthorne   Past Surgical History  Procedure Laterality Date  . Cholecystectomy  2007  . Cardiac catheterization  2006    normal  . Cesarean section  2002  . Right knee sur  2005  . Tonsillectomy    . Tympanostomy tube placement     Family History  Problem Relation Age of Onset  . Heart disease Mother   . Diabetes Father   . Asthma Sister     also had rheumatic fever as a child, died age 9   Social History    Substance Use Topics  . Smoking status: Never Smoker   . Smokeless tobacco: Never Used     Comment: second hand smoke  . Alcohol Use: Yes     Comment: occ   OB History    No data available     Review of Systems  Constitutional: Negative for fever.  Respiratory: Positive for cough (over last 2 days). Negative for shortness of breath.   Cardiovascular: Positive for chest pain and near-syncope (felt weak and slumped to floor being helped by daughter to shower).  Musculoskeletal: Positive for back pain.  All other systems reviewed and are negative.     Allergies  Percocet and Vicodin  Home Medications   Prior to Admission medications   Medication Sig Start Date End Date Taking? Authorizing Provider  albuterol (PROVENTIL HFA;VENTOLIN HFA) 108 (90 BASE) MCG/ACT inhaler Inhale 1-2 puffs into the lungs every 6 (six) hours as needed for wheezing or shortness of breath.  05/05/15 05/04/16  Historical Provider, MD  ALPRAZolam Prudy Feeler) 1 MG tablet Take 1 mg by mouth at bedtime.    Historical Provider, MD  aspirin 81 MG chewable tablet Chew 81 mg by mouth every morning.    Historical Provider, MD  furosemide (LASIX) 40 MG tablet Take 1 tablet (40 mg total) by mouth daily.  08/02/13   Zannie CovePreetha Joseph, MD  guaiFENesin (MUCINEX) 600 MG 12 hr tablet Take 600 mg by mouth 2 (two) times daily as needed for cough.    Historical Provider, MD  naproxen sodium (ANAPROX) 220 MG tablet Take 220-880 mg by mouth 2 (two) times daily with a meal.     Historical Provider, MD  omeprazole (PRILOSEC) 40 MG capsule Take 40 mg by mouth daily.    Historical Provider, MD  oxyCODONE (OXY IR/ROXICODONE) 5 MG immediate release tablet Take 5 mg by mouth 2 (two) times daily.  12/16/14   Historical Provider, MD  phentermine (ADIPEX-P) 37.5 MG tablet Take 37.5 mg by mouth daily before breakfast.    Historical Provider, MD  Potassium Chloride ER 20 MEQ TBCR Take 40 mEq by mouth daily. Patient taking differently: Take 20 mEq by  mouth 2 (two) times daily.  01/15/15   Rodolph Bonganiel Thompson V, MD  predniSONE (DELTASONE) 10 MG tablet Take q day 6,5,4,3,2,1 08/15/15   Mancel BaleElliott Wentz, MD  Rivaroxaban (XARELTO) 20 MG TABS tablet Take 1 tablet (20 mg total) by mouth daily with supper. Starting 10/18, take 20mg  daily 08/23/13   Zannie CovePreetha Joseph, MD   There were no vitals taken for this visit. Physical Exam  Constitutional: She is oriented to person, place, and time. She appears well-developed and well-nourished. No distress.  HENT:  Head: Normocephalic.  Eyes: Conjunctivae are normal.  Neck: Neck supple. No tracheal deviation present.  Cardiovascular: Normal rate and regular rhythm.   Pulmonary/Chest: Effort normal and breath sounds normal. No respiratory distress. She exhibits tenderness (point tender over sternum reproducing symptoms).    Abdominal: Soft. She exhibits no distension.  Neurological: She is alert and oriented to person, place, and time.  Skin: Skin is warm and dry.  Psychiatric: She has a normal mood and affect.    ED Course  Procedures (including critical care time) Labs Review Labs Reviewed  BASIC METABOLIC PANEL - Abnormal; Notable for the following:    Glucose, Bld 131 (*)    Calcium 8.7 (*)    All other components within normal limits  CBC  I-STAT TROPOININ, ED    Imaging Review Dg Chest Portable 1 View  10/30/2015  CLINICAL DATA:  Chest pain starting this morning. Axial May 25, 2015 EXAM: PORTABLE CHEST 1 VIEW COMPARISON:  None. FINDINGS: The heart size and mediastinal contours are stable. The aorta is tortuous. The heart size is enlarged. Both lungs are clear. The visualized skeletal structures are unremarkable. IMPRESSION: No active cardiopulmonary disease.  Cardiomegaly. Electronically Signed   By: Sherian ReinWei-Chen  Lin M.D.   On: 10/30/2015 11:19   I have personally reviewed and evaluated these images and lab results as part of my medical decision-making.   EKG Interpretation   Date/Time:  Saturday  October 30 2015 10:20:57 EST Ventricular Rate:  52 PR Interval:  178 QRS Duration: 116 QT Interval:  477 QTC Calculation: 444 R Axis:   58 Text Interpretation:  Sinus rhythm No significant change since last  tracing Since last tracing rate slower Confirmed by Kaye Mitro MD, Kloee Ballew  ([id number]) on 10/30/2015 10:27:56 AM      MDM   Final diagnoses:  Other fatigue  Cough  Chest wall pain   49 year old female presents with recurrent chest pain for which she has been evaluated previously. She has felt generally unwell today and asked her daughter for help getting to the shower where she slumped to the ground and felt weak. She is withdrawn here but in no  acute distress. She has sinus bradycardia but no evidence of hemodynamic change from prior visits. She has a cough for the last 2 days but no fever or other infectious symptoms. She has focal tenderness over her anterior chest that reproduces her pain, has a low risk by heart score, is already anticoagulated on Xarelto for previous pulmonary embolus and is oxygenating well so do not feel this is the cause of patient's pain and weakness today. Was given IV rehydration therapy for possible dehydration and will be monitored in the emergency department, plan for discharge if workup negative.    Lyndal Pulley, MD 10/31/15 [phone]

## 2015-10-30 NOTE — ED Notes (Addendum)
[facility]- pt here for chest pain that started around 0930. Pt reports central chest pain with HX of PE and cardiac cath. Pt is alert and oriented but very lethargic. Pt received 324 asa and 1 nitro en route.

## 2016-04-05 ENCOUNTER — Encounter (HOSPITAL_COMMUNITY): Payer: Self-pay

## 2016-04-05 ENCOUNTER — Emergency Department (HOSPITAL_COMMUNITY): Payer: Medicaid Other

## 2016-04-05 ENCOUNTER — Observation Stay (HOSPITAL_COMMUNITY)
Admission: EM | Admit: 2016-04-05 | Discharge: 2016-04-08 | Disposition: A | Discharge status: Home / Self Care | Payer: Medicaid Other | Attending: Internal Medicine | Admitting: Internal Medicine

## 2016-04-05 DIAGNOSIS — F419 Anxiety disorder, unspecified: Secondary | ICD-10-CM | POA: Diagnosis not present

## 2016-04-05 DIAGNOSIS — M5430 Sciatica, unspecified side: Secondary | ICD-10-CM | POA: Insufficient documentation

## 2016-04-05 DIAGNOSIS — E669 Obesity, unspecified: Secondary | ICD-10-CM | POA: Insufficient documentation

## 2016-04-05 DIAGNOSIS — I110 Hypertensive heart disease with heart failure: Secondary | ICD-10-CM | POA: Diagnosis not present

## 2016-04-05 DIAGNOSIS — M1990 Unspecified osteoarthritis, unspecified site: Secondary | ICD-10-CM | POA: Insufficient documentation

## 2016-04-05 DIAGNOSIS — F329 Major depressive disorder, single episode, unspecified: Secondary | ICD-10-CM | POA: Diagnosis not present

## 2016-04-05 DIAGNOSIS — Z7951 Long term (current) use of inhaled steroids: Secondary | ICD-10-CM | POA: Diagnosis not present

## 2016-04-05 DIAGNOSIS — Z79891 Long term (current) use of opiate analgesic: Secondary | ICD-10-CM | POA: Insufficient documentation

## 2016-04-05 DIAGNOSIS — I509 Heart failure, unspecified: Secondary | ICD-10-CM | POA: Diagnosis not present

## 2016-04-05 DIAGNOSIS — Z86711 Personal history of pulmonary embolism: Secondary | ICD-10-CM | POA: Insufficient documentation

## 2016-04-05 DIAGNOSIS — M549 Dorsalgia, unspecified: Secondary | ICD-10-CM

## 2016-04-05 DIAGNOSIS — R001 Bradycardia, unspecified: Secondary | ICD-10-CM | POA: Diagnosis not present

## 2016-04-05 DIAGNOSIS — R61 Generalized hyperhidrosis: Secondary | ICD-10-CM | POA: Insufficient documentation

## 2016-04-05 DIAGNOSIS — R079 Chest pain, unspecified: Principal | ICD-10-CM | POA: Diagnosis present

## 2016-04-05 DIAGNOSIS — Z79899 Other long term (current) drug therapy: Secondary | ICD-10-CM | POA: Insufficient documentation

## 2016-04-05 DIAGNOSIS — G8929 Other chronic pain: Secondary | ICD-10-CM | POA: Diagnosis not present

## 2016-04-05 DIAGNOSIS — I10 Essential (primary) hypertension: Secondary | ICD-10-CM | POA: Diagnosis present

## 2016-04-05 DIAGNOSIS — Z7901 Long term (current) use of anticoagulants: Secondary | ICD-10-CM

## 2016-04-05 DIAGNOSIS — Z6841 Body Mass Index (BMI) 40.0 and over, adult: Secondary | ICD-10-CM | POA: Insufficient documentation

## 2016-04-05 DIAGNOSIS — I2699 Other pulmonary embolism without acute cor pulmonale: Secondary | ICD-10-CM

## 2016-04-05 DIAGNOSIS — Z7982 Long term (current) use of aspirin: Secondary | ICD-10-CM | POA: Diagnosis not present

## 2016-04-05 DIAGNOSIS — E876 Hypokalemia: Secondary | ICD-10-CM | POA: Diagnosis present

## 2016-04-05 LAB — CBC
HCT: 36.7 % (ref 36.0–46.0)
Hemoglobin: 11.9 g/dL — ABNORMAL LOW (ref 12.0–15.0)
MCH: 28.7 pg (ref 26.0–34.0)
MCHC: 32.4 g/dL (ref 30.0–36.0)
MCV: 88.6 fL (ref 78.0–100.0)
PLATELETS: 286 10*3/uL (ref 150–400)
RBC: 4.14 MIL/uL (ref 3.87–5.11)
RDW: 13.4 % (ref 11.5–15.5)
WBC: 8.5 10*3/uL (ref 4.0–10.5)

## 2016-04-05 LAB — BASIC METABOLIC PANEL
ANION GAP: 7 (ref 5–15)
BUN: 11 mg/dL (ref 6–20)
CO2: 28 mmol/L (ref 22–32)
CREATININE: 0.71 mg/dL (ref 0.44–1.00)
Calcium: 8.7 mg/dL — ABNORMAL LOW (ref 8.9–10.3)
Chloride: 102 mmol/L (ref 101–111)
Glucose, Bld: 113 mg/dL — ABNORMAL HIGH (ref 65–99)
Potassium: 3.3 mmol/L — ABNORMAL LOW (ref 3.5–5.1)
Sodium: 137 mmol/L (ref 135–145)

## 2016-04-05 LAB — D-DIMER, QUANTITATIVE: D-Dimer, Quant: <0.27 ug/mL-FEU (ref 0.00–0.50)

## 2016-04-05 LAB — I-STAT TROPONIN, ED: TROPONIN I, POC: 0 ng/mL (ref 0.00–0.08)

## 2016-04-05 MED ORDER — NITROGLYCERIN 0.4 MG SL SUBL
0.4000 mg | SUBLINGUAL_TABLET | SUBLINGUAL | Status: DC | PRN
  Administered 2016-04-05 – 2016-04-07 (×9): 0.4 mg via SUBLINGUAL
  Filled 2016-04-05 (×3): qty 1

## 2016-04-05 NOTE — ED Notes (Signed)
BP 109/69 HR SR with rate 95 and O2 sat 95%

## 2016-04-05 NOTE — ED Notes (Signed)
Patient states the pain and discomfort remains substernal with radiation through to back with pain level 7-8/10.  Daughter remains at bedside.

## 2016-04-05 NOTE — ED Notes (Signed)
Patient complaining of substernal chest pain and pressure radiating through to back.  Dr. Freida BusmanAllen made aware and NTG 0.4 mg SL administered. 12 lead EKG repeated. Patient states pain and pressure are 9/10 prior to NTG SL

## 2016-04-05 NOTE — ED Provider Notes (Signed)
CSN: [id number]     Arrival date & time 04/05/16  2107 History   First MD Initiated Contact with Patient 04/05/16 2202     Chief Complaint  Patient presents with  . Chest Pain     (Consider location/radiation/quality/duration/timing/severity/associated sxs/prior Treatment) HPI Comments: Patient here complaining of intermittent central chest pain that radiates through to her back and down her left arm since about 8:00 this evening. Symptoms last for several minutes and are associated with dyspnea as well as diaphoresis. Denies any recent fever or cough. No prior history of angina. Had a normal heart catheterization 11 years ago. Patient also has chronic thoracic back pain but states that her current discomfort is different than what she's had before in the past. No treatment used for this. Symptoms are nonexertional. Denies any leg pain or swelling. No pleuritic component to this.  Patient is a 50 y.o. female presenting with chest pain. The history is provided by the patient.  Chest Pain   Past Medical History  Diagnosis Date  . OA (osteoarthritis)   . HTN (hypertension)   . Obesity   . Bipolar 1 disorder (HCC)   . Depression   . Depression   . Back pain   . Anxiety   . Sciatica   . PE (pulmonary embolism)     oct 2014  . Chronic pain entered 08/15/2015    Treated with Oxycodone 5mg  every month, prescribed by Dr. Mikeal Hawthorne   Past Surgical History  Procedure Laterality Date  . Cholecystectomy  2007  . Cardiac catheterization  2006    normal  . Cesarean section  2002  . Right knee sur  2005  . Tonsillectomy    . Tympanostomy tube placement     Family History  Problem Relation Age of Onset  . Heart disease Mother   . Diabetes Father   . Asthma Sister     also had rheumatic fever as a child, died age 8   Social History  Substance Use Topics  . Smoking status: Never Smoker   . Smokeless tobacco: Never Used     Comment: second hand smoke  . Alcohol Use: Yes     Comment:  occ   OB History    No data available     Review of Systems  Cardiovascular: Positive for chest pain.  All other systems reviewed and are negative.     Allergies  Percocet and Vicodin  Home Medications   Prior to Admission medications   Medication Sig Start Date End Date Taking? Authorizing Provider  albuterol (PROVENTIL HFA;VENTOLIN HFA) 108 (90 BASE) MCG/ACT inhaler Inhale 1-2 puffs into the lungs every 6 (six) hours as needed for wheezing or shortness of breath.  05/05/15 05/04/16 Yes Historical Provider, MD  Ascorbic Acid (VITAMIN C) 100 MG tablet Take 200 mg by mouth daily.   Yes Historical Provider, MD  aspirin 81 MG chewable tablet Chew 81 mg by mouth every morning.   Yes Historical Provider, MD  diazepam (VALIUM) 5 MG tablet Take 5 mg by mouth at bedtime as needed for anxiety.  03/02/16  Yes Historical Provider, MD  fluticasone (FLONASE) 50 MCG/ACT nasal spray instill 1 spray into each nostril twice a day as needed for allergies 01/13/16  Yes Historical Provider, MD  furosemide (LASIX) 40 MG tablet Take 1 tablet (40 mg total) by mouth daily. 08/02/13  Yes Zannie Cove, MD  naproxen sodium (ANAPROX) 220 MG tablet Take 440 mg by mouth 2 (two) times daily as needed (  pain).    Yes Historical Provider, MD  omeprazole (PRILOSEC) 40 MG capsule Take 40 mg by mouth daily.   Yes Historical Provider, MD  Oxycodone HCl 10 MG TABS Take 1 tablet by mouth every 8 to 12 hours if needed for pain 03/20/16  Yes Historical Provider, MD  phentermine (ADIPEX-P) 37.5 MG tablet Take 37.5 mg by mouth daily before breakfast. Reported on 04/05/2016   Yes Historical Provider, MD  Phenylephrine-Pheniramine-DM [facility](THERAFLU COLD & COUGH PO) Take 30 mLs by mouth daily as needed (flu symptoms).   Yes Historical Provider, MD  potassium chloride SA (K-DUR,KLOR-CON) 20 MEQ tablet Take 20 mEq by mouth 2 (two) times daily.  04/04/16  Yes Historical Provider, MD  Rivaroxaban (XARELTO) 20 MG TABS tablet Take 1 tablet (20 mg  total) by mouth daily with supper. Starting 10/18, take 20mg  daily 08/23/13  Yes Zannie CovePreetha Joseph, MD  guaiFENesin (MUCINEX) 600 MG 12 hr tablet Take 600 mg by mouth 2 (two) times daily as needed for cough.    Historical Provider, MD  Potassium Chloride ER 20 MEQ TBCR Take 40 mEq by mouth daily. Patient not taking: Reported on 04/05/2016 01/15/15   Rodolph Bonganiel Thompson V, MD  predniSONE (DELTASONE) 10 MG tablet Take q day 6,5,4,3,2,1 Patient not taking: Reported on 04/05/2016 08/15/15   Mancel BaleElliott Wentz, MD   BP 162/89 mmHg  Pulse 95  Temp(Src) 99 F (37.2 C) (Oral)  Resp 24  SpO2 95% Physical Exam  Constitutional: She is oriented to person, place, and time. She appears well-developed and well-nourished.  Non-toxic appearance. No distress.  HENT:  Head: Normocephalic and atraumatic.  Eyes: Conjunctivae, EOM and lids are normal. Pupils are equal, round, and reactive to light.  Neck: Normal range of motion. Neck supple. No tracheal deviation present. No thyroid mass present.  Cardiovascular: Normal rate, regular rhythm and normal heart sounds.  Exam reveals no gallop.   No murmur heard. Pulmonary/Chest: Effort normal and breath sounds normal. No stridor. No respiratory distress. She has no decreased breath sounds. She has no wheezes. She has no rhonchi. She has no rales.  Abdominal: Soft. Normal appearance and bowel sounds are normal. She exhibits no distension. There is no tenderness. There is no rebound and no CVA tenderness.  Musculoskeletal: Normal range of motion. She exhibits no edema or tenderness.  Neurological: She is alert and oriented to person, place, and time. She has normal strength. No cranial nerve deficit or sensory deficit. GCS eye subscore is 4. GCS verbal subscore is 5. GCS motor subscore is 6.  Skin: Skin is warm and dry. No abrasion and no rash noted.  Psychiatric: She has a normal mood and affect. Her speech is normal and behavior is normal.  Nursing note and vitals  reviewed.   ED Course  Procedures (including critical care time) Labs Review Labs Reviewed  BASIC METABOLIC PANEL - Abnormal; Notable for the following:    Potassium 3.3 (*)    Glucose, Bld 113 (*)    Calcium 8.7 (*)    All other components within normal limits  CBC - Abnormal; Notable for the following:    Hemoglobin 11.9 (*)    All other components within normal limits  D-DIMER, QUANTITATIVE (NOT [facility])  I-STAT TROPOININ, ED    Imaging Review No results found. I have personally reviewed and evaluated these images and lab results as part of my medical decision-making.   EKG Interpretation   Date/Time:  Wednesday Apr 05 2016 21:27:07 EDT Ventricular Rate:  6076  PR Interval:  165 QRS Duration: 117 QT Interval:  410 QTC Calculation: 461 R Axis:   4 Text Interpretation:  Sinus rhythm Nonspecific intraventricular conduction  delay Low voltage, precordial leads Confirmed by Izacc Demeyer  MD, Alias Villagran  ([id number]) on 04/05/2016 10:32:46 PM      MDM   Final diagnoses:  None    Patient given nitroglycerin with some improvement of her pain. Will be admitted for observation    Lorre Nick, MD 04/05/16 2356

## 2016-04-05 NOTE — ED Notes (Signed)
BP 116/72, HR 101 O2 sat 99%

## 2016-04-05 NOTE — ED Notes (Signed)
Patient states this chest pain is like the chest pain she normally has.

## 2016-04-05 NOTE — ED Notes (Signed)
Pt complains of central chest pain that radiates through her back and down her arm that started today

## 2016-04-05 NOTE — ED Notes (Signed)
Patient states the SL NTG has not really done anything for her chest pain. BP 111/64, HR SR with rate 85-92 with RR 21 and O2 sat 95%

## 2016-04-05 NOTE — ED Notes (Signed)
Patients states no difference in her pain and discomfort after 2nd nitro SL.  Remains 8/10 BP 114/61 MP SR with rate 90's and no ectopy noted.  O2 sat 96% RA

## 2016-04-06 ENCOUNTER — Observation Stay (HOSPITAL_COMMUNITY): Payer: Medicaid Other

## 2016-04-06 DIAGNOSIS — R079 Chest pain, unspecified: Secondary | ICD-10-CM | POA: Diagnosis not present

## 2016-04-06 DIAGNOSIS — Z7901 Long term (current) use of anticoagulants: Secondary | ICD-10-CM

## 2016-04-06 DIAGNOSIS — I2699 Other pulmonary embolism without acute cor pulmonale: Secondary | ICD-10-CM

## 2016-04-06 DIAGNOSIS — E876 Hypokalemia: Secondary | ICD-10-CM | POA: Diagnosis not present

## 2016-04-06 DIAGNOSIS — I10 Essential (primary) hypertension: Secondary | ICD-10-CM | POA: Diagnosis not present

## 2016-04-06 LAB — CBC
HCT: 32.9 % — ABNORMAL LOW (ref 36.0–46.0)
Hemoglobin: 11.3 g/dL — ABNORMAL LOW (ref 12.0–15.0)
MCH: 29.6 pg (ref 26.0–34.0)
MCHC: 34.3 g/dL (ref 30.0–36.0)
MCV: 86.1 fL (ref 78.0–100.0)
Platelets: 281 10*3/uL (ref 150–400)
RBC: 3.82 MIL/uL — ABNORMAL LOW (ref 3.87–5.11)
RDW: 13.4 % (ref 11.5–15.5)
WBC: 7.7 10*3/uL (ref 4.0–10.5)

## 2016-04-06 LAB — TROPONIN I: Troponin I: <0.03 ng/mL (ref <0.031)

## 2016-04-06 LAB — LIPID PANEL
CHOLESTEROL: 131 mg/dL (ref 0–200)
HDL: 45 mg/dL (ref >40)
LDL CALC: 72 mg/dL (ref 0–99)
TRIGLYCERIDES: 71 mg/dL (ref <150)
Total CHOL/HDL Ratio: 2.9 RATIO
VLDL: 14 mg/dL (ref 0–40)

## 2016-04-06 LAB — RAPID URINE DRUG SCREEN, HOSP PERFORMED
Amphetamines: POSITIVE — AB
BARBITURATES: NOT DETECTED
BENZODIAZEPINES: POSITIVE — AB
Cocaine: NOT DETECTED
Opiates: POSITIVE — AB
Tetrahydrocannabinol: NOT DETECTED

## 2016-04-06 LAB — ECHOCARDIOGRAM COMPLETE
HEIGHTINCHES: 65 in
WEIGHTICAEL: 4136 [oz_av]

## 2016-04-06 LAB — BASIC METABOLIC PANEL
ANION GAP: 8 (ref 5–15)
BUN: 12 mg/dL (ref 6–20)
CALCIUM: 8.4 mg/dL — AB (ref 8.9–10.3)
CO2: 24 mmol/L (ref 22–32)
CREATININE: 0.58 mg/dL (ref 0.44–1.00)
Chloride: 103 mmol/L (ref 101–111)
GLUCOSE: 107 mg/dL — AB (ref 65–99)
POTASSIUM: 3.8 mmol/L (ref 3.5–5.1)
Sodium: 135 mmol/L (ref 135–145)

## 2016-04-06 MED ORDER — POTASSIUM CHLORIDE CRYS ER 20 MEQ PO TBCR
20.0000 meq | EXTENDED_RELEASE_TABLET | Freq: Two times a day (BID) | ORAL | Status: DC
  Administered 2016-04-06 – 2016-04-08 (×6): 20 meq via ORAL
  Filled 2016-04-06 (×6): qty 1

## 2016-04-06 MED ORDER — POTASSIUM CHLORIDE CRYS ER 20 MEQ PO TBCR
40.0000 meq | EXTENDED_RELEASE_TABLET | Freq: Once | ORAL | Status: AC
  Administered 2016-04-06: 40 meq via ORAL
  Filled 2016-04-06: qty 2

## 2016-04-06 MED ORDER — HYDROMORPHONE HCL 1 MG/ML IJ SOLN
0.5000 mg | INTRAMUSCULAR | Status: DC | PRN
  Administered 2016-04-06 (×3): 1 mg via INTRAVENOUS
  Filled 2016-04-06 (×3): qty 1

## 2016-04-06 MED ORDER — SODIUM CHLORIDE 0.9 % IV SOLN: 250.0000 mL | INTRAVENOUS | Status: DC | PRN

## 2016-04-06 MED ORDER — SODIUM CHLORIDE 0.9% FLUSH: 3.0000 mL | INTRAVENOUS | Status: DC | PRN

## 2016-04-06 MED ORDER — NITROGLYCERIN 2 % TD OINT
0.5000 [in_us] | TOPICAL_OINTMENT | Freq: Four times a day (QID) | TRANSDERMAL | Status: DC
  Administered 2016-04-06 (×4): 0.5 [in_us] via TOPICAL
  Filled 2016-04-06: qty 30
  Filled 2016-04-06: qty 1

## 2016-04-06 MED ORDER — DIAZEPAM 5 MG PO TABS
5.0000 mg | ORAL_TABLET | Freq: Every evening | ORAL | Status: DC | PRN
  Administered 2016-04-06 – 2016-04-07 (×3): 5 mg via ORAL
  Filled 2016-04-06 (×3): qty 1

## 2016-04-06 MED ORDER — SODIUM CHLORIDE 0.9% FLUSH
3.0000 mL | Freq: Two times a day (BID) | INTRAVENOUS | Status: DC
  Administered 2016-04-06 – 2016-04-07 (×3): 3 mL via INTRAVENOUS

## 2016-04-06 MED ORDER — VITAMIN C 500 MG PO TABS
250.0000 mg | ORAL_TABLET | Freq: Every day | ORAL | Status: DC
  Administered 2016-04-06 – 2016-04-07 (×2): 250 mg via ORAL
  Filled 2016-04-06 (×3): qty 1

## 2016-04-06 MED ORDER — ONDANSETRON HCL 4 MG PO TABS: 4.0000 mg | ORAL_TABLET | Freq: Four times a day (QID) | ORAL | Status: DC | PRN

## 2016-04-06 MED ORDER — ASPIRIN EC 325 MG PO TBEC
325.0000 mg | DELAYED_RELEASE_TABLET | Freq: Every day | ORAL | Status: DC
  Administered 2016-04-06 – 2016-04-08 (×3): 325 mg via ORAL
  Filled 2016-04-06 (×4): qty 1

## 2016-04-06 MED ORDER — ACETAMINOPHEN 325 MG PO TABS: 650.0000 mg | ORAL_TABLET | Freq: Four times a day (QID) | ORAL | Status: DC | PRN

## 2016-04-06 MED ORDER — ONDANSETRON HCL 4 MG/2ML IJ SOLN
4.0000 mg | Freq: Four times a day (QID) | INTRAMUSCULAR | Status: DC | PRN
  Administered 2016-04-06: 4 mg via INTRAVENOUS
  Filled 2016-04-06: qty 2

## 2016-04-06 MED ORDER — ACETAMINOPHEN 650 MG RE SUPP: 650.0000 mg | Freq: Four times a day (QID) | RECTAL | Status: DC | PRN

## 2016-04-06 MED ORDER — OXYCODONE HCL 5 MG PO TABS
5.0000 mg | ORAL_TABLET | ORAL | Status: DC | PRN
  Administered 2016-04-07 – 2016-04-08 (×5): 5 mg via ORAL
  Filled 2016-04-06 (×5): qty 1

## 2016-04-06 MED ORDER — FUROSEMIDE 40 MG PO TABS
40.0000 mg | ORAL_TABLET | Freq: Every day | ORAL | Status: DC
  Administered 2016-04-06 – 2016-04-08 (×3): 40 mg via ORAL
  Filled 2016-04-06 (×3): qty 1

## 2016-04-06 MED ORDER — PANTOPRAZOLE SODIUM 40 MG PO TBEC
40.0000 mg | DELAYED_RELEASE_TABLET | Freq: Every day | ORAL | Status: DC
  Administered 2016-04-06 – 2016-04-08 (×3): 40 mg via ORAL
  Filled 2016-04-06 (×3): qty 1

## 2016-04-06 MED ORDER — SODIUM CHLORIDE 0.9% FLUSH
3.0000 mL | Freq: Two times a day (BID) | INTRAVENOUS | Status: DC
  Administered 2016-04-06 (×2): 3 mL via INTRAVENOUS

## 2016-04-06 MED ORDER — RIVAROXABAN 20 MG PO TABS
20.0000 mg | ORAL_TABLET | Freq: Every day | ORAL | Status: DC
  Administered 2016-04-06 – 2016-04-07 (×2): 20 mg via ORAL
  Filled 2016-04-06 (×2): qty 1

## 2016-04-06 NOTE — Progress Notes (Signed)
Echocardiogram 2D Echocardiogram has been performed.  Dorothey BasemanReel, Dammon Makarewicz M 04/06/2016, 4:11 PM

## 2016-04-06 NOTE — Progress Notes (Signed)
Patient seen and examined, vital stable, does has chest wall tenderness, very anxious, awaiting echo result.

## 2016-04-06 NOTE — ED Notes (Signed)
Dr. Mort SawyersH. Jenkins at bedside.

## 2016-04-06 NOTE — H&P (Signed)
[facility] Admission History and Physical       Katie [name] ZOX:[id number] DOB: 03/31/1966 DOA: 04/05/2016  Referring physician: EDP PCP: Lonia BloodGARBA,LAWAL, MD  Specialists:   Chief Complaint: Chest Pain  HPI: Katie [name] is a 50 y.o. female with a history of HTN, Pulmonary Embolism on Xarelto Rx, Bipolar Disorder, and Chronic Pain syndrome who presents to the Ed with complaints of Central Chest pain rated at 9/10 and described as Chest Tightness since 8 PM.   She reports having associated symptoms of SOB, and Nausea with Diaphoresis.    The pain radiated into her back and left Arm.   The initial Cardia Workup in the ED was negative and she was referred for further evaluation.      Review of Systems:    Constitutional: No Weight Loss, No Weight Gain, Night Sweats, Fevers, Chills, Dizziness, Light Headedness, Fatigue, or Generalized Weakness HEENT: No Headaches, Difficulty Swallowing,Tooth/Dental Problems,Sore Throat,  No Sneezing, Rhinitis, Ear Ache, Nasal Congestion, or Post Nasal Drip,  Cardio-vascular:  +Chest pain, Orthopnea, PND, Edema in Lower Extremities, Anasarca, Dizziness, Palpitations  Resp:  +Dyspnea, No DOE, No Productive Cough, No Non-Productive Cough, No Hemoptysis, No Wheezing.    GI: No Heartburn, Indigestion, Abdominal Pain, Nausea, Vomiting, Diarrhea, Constipation, Hematemesis, Hematochezia, Melena, Change in Bowel Habits,  Loss of Appetite  GU: No Dysuria, No Change in Color of Urine, No Urgency or Urinary Frequency, No Flank pain.  Musculoskeletal: No Joint Pain or Swelling, No Decreased Range of Motion, No Back Pain.  Neurologic: No Syncope, No Seizures, Muscle Weakness, Paresthesia, Vision Disturbance or Loss, No Diplopia, No Vertigo, No Difficulty Walking,  Skin: No Rash or Lesions. Psych: No Change in Mood or Affect, No Depression or Anxiety, No Memory loss, No Confusion, or Hallucinations   Past Medical History  Diagnosis Date  . OA (osteoarthritis)     . HTN (hypertension)   . Obesity   . Bipolar 1 disorder (HCC)   . Depression   . Depression   . Back pain   . Anxiety   . Sciatica   . PE (pulmonary embolism)     oct 2014  . Chronic pain entered 08/15/2015    Treated with Oxycodone 5mg  every month, prescribed by Dr. Mikeal HawthorneGarba     Past Surgical History  Procedure Laterality Date  . Cholecystectomy  2007  . Cardiac catheterization  2006    normal  . Cesarean section  2002  . Right knee sur  2005  . Tonsillectomy    . Tympanostomy tube placement        Prior to Admission medications   Medication Sig Start Date End Date Taking? Authorizing Provider  albuterol (PROVENTIL HFA;VENTOLIN HFA) 108 (90 BASE) MCG/ACT inhaler Inhale 1-2 puffs into the lungs every 6 (six) hours as needed for wheezing or shortness of breath.  05/05/15 05/04/16 Yes Historical Provider, MD  Ascorbic Acid (VITAMIN C) 100 MG tablet Take 200 mg by mouth daily.   Yes Historical Provider, MD  aspirin 81 MG chewable tablet Chew 81 mg by mouth every morning.   Yes Historical Provider, MD  diazepam (VALIUM) 5 MG tablet Take 5 mg by mouth at bedtime as needed for anxiety.  03/02/16  Yes Historical Provider, MD  fluticasone (FLONASE) 50 MCG/ACT nasal spray instill 1 spray into each nostril twice a day as needed for allergies 01/13/16  Yes Historical Provider, MD  furosemide (LASIX) 40 MG tablet Take 1 tablet (40 mg total) by mouth daily. 08/02/13  Yes  Zannie Cove, MD  naproxen sodium (ANAPROX) 220 MG tablet Take 440 mg by mouth 2 (two) times daily as needed (pain).    Yes Historical Provider, MD  omeprazole (PRILOSEC) 40 MG capsule Take 40 mg by mouth daily.   Yes Historical Provider, MD  Oxycodone HCl 10 MG TABS Take 1 tablet by mouth every 8 to 12 hours if needed for pain 03/20/16  Yes Historical Provider, MD  phentermine (ADIPEX-P) 37.5 MG tablet Take 37.5 mg by mouth daily before breakfast. Reported on 04/05/2016   Yes Historical Provider, MD  Phenylephrine-Pheniramine-DM  [facility] COLD & COUGH PO) Take 30 mLs by mouth daily as needed (flu symptoms).   Yes Historical Provider, MD  potassium chloride SA (K-DUR,KLOR-CON) 20 MEQ tablet Take 20 mEq by mouth 2 (two) times daily.  04/04/16  Yes Historical Provider, MD  Rivaroxaban (XARELTO) 20 MG TABS tablet Take 1 tablet (20 mg total) by mouth daily with supper. Starting 10/18, take  daily 08/23/13  Yes Zannie Cove, MD  guaiFENesin (MUCINEX) 600 MG 12 hr tablet Take 600 mg by mouth 2 (two) times daily as needed for cough.    Historical Provider, MD  Potassium Chloride ER 20 MEQ TBCR Take 40 mEq by mouth daily. Patient not taking: Reported on 04/05/2016 01/15/15   Rodolph Bong, MD  predniSONE (DELTASONE) 10 MG tablet Take q day 6,5,4,3,2,1 Patient not taking: Reported on 04/05/2016 08/15/15   Mancel Bale, MD     Allergies  Allergen Reactions  . Percocet [Oxycodone-Acetaminophen] Hives  . Vicodin [Hydrocodone-Acetaminophen] Hives    Hives & swelling    Social History:  reports that she has never smoked. She has never used smokeless tobacco. She reports that she drinks alcohol. She reports that she does not use illicit drugs.    Family History  Problem Relation Age of Onset  . Heart disease Mother   . Diabetes Father   . Asthma Sister     also had rheumatic fever as a child, died age 55       Physical Exam:  GEN:  Pleasant Obese  50 y.o. African American female examined and in no acute distress; cooperative with exam Filed Vitals:   04/05/16 2340 04/05/16 2347 04/05/16 2353 04/05/16 2356  BP: 116/72 114/61 109/69 111/64  Pulse: 83 91 85 87  Temp:      TempSrc:      Resp: SpO2: 100% 96% 98% 99%   Blood pressure 111/64, pulse 87, temperature 99 F (37.2 C), temperature source Oral, resp. rate 25, SpO2 99 %. PSYCH: She is alert and oriented x4; does not appear anxious does not appear depressed; affect is normal HEENT: Normocephalic and Atraumatic, Mucous membranes pink; PERRLA;  EOM intact; Fundi:  Benign;  No scleral icterus, Nares: Patent, Oropharynx: Clear, Fair Dentition,    Neck:  FROM, No Cervical Lymphadenopathy nor Thyromegaly or Carotid Bruit; No JVD; Breasts:: Not examined CHEST WALL: No tenderness CHEST: Normal respiration, clear to auscultation bilaterally HEART: Regular rate and rhythm; no murmurs rubs or gallops BACK: No kyphosis or scoliosis; No CVA tenderness ABDOMEN: Positive Bowel Sounds, Obese, Soft Non-Tender, No Rebound or Guarding; No Masses, No Organomegaly Rectal Exam: Not done EXTREMITIES: No Cyanosis, Clubbing, or Edema; No Ulcerations. Genitalia: not examined PULSES: 2+ and symmetric SKIN: Normal hydration no rash or ulceration CNS:  Alert and Oriented x 4, No Focal Deficits Vascular: pulses palpable throughout    Labs on Admission:  Basic Metabolic Panel:  Recent Labs  Lab 04/05/16 2135  NA 137  K 3.3*  CL 102  CO2 28  GLUCOSE 113*  BUN 11  CREATININE 0.71  CALCIUM 8.7*   Liver Function Tests: No results for input(s): AST, ALT, ALKPHOS, BILITOT, PROT, ALBUMIN in the last 168 hours. No results for input(s): LIPASE, AMYLASE in the last 168 hours. No results for input(s): AMMONIA in the last 168 hours. CBC:  Recent Labs Lab 04/05/16 2135  WBC 8.5  HGB 11.9*  HCT 36.7  MCV 88.6  PLT 286   Cardiac Enzymes: No results for input(s): CKTOTAL, CKMB, CKMBINDEX, TROPONINI in the last 168 hours.  BNP (last 3 results) No results for input(s): BNP in the last 8760 hours.  ProBNP (last 3 results) No results for input(s): PROBNP in the last 8760 hours.  CBG: No results for input(s): GLUCAP in the last 168 hours.  Radiological Exams on Admission: Dg Chest 2 View  04/05/2016  CLINICAL DATA:  Central chest pain and shortness of breath, onset 2 hours prior. EXAM: CHEST  2 VIEW COMPARISON:  10/30/2015 FINDINGS: The cardiomediastinal contours are unchanged with mild cardiomegaly. The lungs are clear. Pulmonary vasculature is  normal. No consolidation, pleural effusion, or pneumothorax. No acute osseous abnormalities are seen. IMPRESSION: Stable mild cardiomegaly.  No localizing pulmonary process. Electronically Signed   By: Rubye Oaks M.D.   On: 04/05/2016 22:33     EKG: Independently reviewed.  Normal Sinus Rhythm Rate = 83, No Acute S-T Changes   Assessment/Plan:   49 y.o. female with  Principal Problem:    Chest pain   Cardiac Monitoring   Cycle Troponins   Nitropaste, O2 ASA,    Check Lipids   2D ECHO in AM   Active Problems:     Hypokalemia- due to Lasix Rx   Replace with KCl 40 meg PO x 1   Check Magnesium Level      Essential hypertension, benign   On Lasix and KCl Rx   Monitor BPs       Pulmonary embolism on long-term anticoagulation therapy (HCC)   Continue Xarelto Rx      Chronic Pain- Sciatica of BLEs, and Radiculopathy of RUE    On Oxycodone Rx   See Eulah Pont and [facility] as Outpt and receives Injections      DVT Prophylaxis   On Xarelto Rx         Code Status:     FULL CODE        Family Communication:   Daughter at Bedside  Disposition Plan:   Observation Status        Time spent: 59 Minutes      Ron Parker [facility] Pager [phone]   If 7AM -7PM Please Contact the Day Rounding Team MD for [facility]  If 7PM-7AM, Please Contact Night-Floor Coverage  [email or web address] Password [email or web address] 04/06/2016, 12:08 AM     ADDENDUM:   Patient was seen and examined on 04/06/2016

## 2016-04-07 ENCOUNTER — Observation Stay (HOSPITAL_COMMUNITY): Payer: Medicaid Other

## 2016-04-07 ENCOUNTER — Encounter (HOSPITAL_COMMUNITY): Payer: Self-pay | Admitting: Physician Assistant

## 2016-04-07 DIAGNOSIS — I10 Essential (primary) hypertension: Secondary | ICD-10-CM

## 2016-04-07 DIAGNOSIS — Z7901 Long term (current) use of anticoagulants: Secondary | ICD-10-CM | POA: Diagnosis not present

## 2016-04-07 DIAGNOSIS — R079 Chest pain, unspecified: Secondary | ICD-10-CM | POA: Diagnosis not present

## 2016-04-07 DIAGNOSIS — R001 Bradycardia, unspecified: Secondary | ICD-10-CM | POA: Diagnosis not present

## 2016-04-07 DIAGNOSIS — I509 Heart failure, unspecified: Secondary | ICD-10-CM | POA: Diagnosis not present

## 2016-04-07 DIAGNOSIS — I2699 Other pulmonary embolism without acute cor pulmonale: Secondary | ICD-10-CM | POA: Diagnosis not present

## 2016-04-07 DIAGNOSIS — E876 Hypokalemia: Secondary | ICD-10-CM

## 2016-04-07 DIAGNOSIS — I110 Hypertensive heart disease with heart failure: Secondary | ICD-10-CM | POA: Diagnosis not present

## 2016-04-07 LAB — BASIC METABOLIC PANEL
ANION GAP: 6 (ref 5–15)
BUN: 14 mg/dL (ref 6–20)
CHLORIDE: 102 mmol/L (ref 101–111)
CO2: 28 mmol/L (ref 22–32)
Calcium: 8.5 mg/dL — ABNORMAL LOW (ref 8.9–10.3)
Creatinine, Ser: 0.49 mg/dL (ref 0.44–1.00)
GFR calc Af Amer: >60 mL/min (ref >60)
GLUCOSE: 104 mg/dL — AB (ref 65–99)
POTASSIUM: 4.5 mmol/L (ref 3.5–5.1)
Sodium: 136 mmol/L (ref 135–145)

## 2016-04-07 LAB — MAGNESIUM: Magnesium: 1.6 mg/dL — ABNORMAL LOW (ref 1.7–2.4)

## 2016-04-07 MED ORDER — NITROGLYCERIN 0.4 MG SL SUBL
SUBLINGUAL_TABLET | SUBLINGUAL | Status: AC
  Administered 2016-04-07: 0.4 mg
  Filled 2016-04-07: qty 3

## 2016-04-07 MED ORDER — NITROGLYCERIN 0.4 MG SL SUBL
SUBLINGUAL_TABLET | SUBLINGUAL | Status: AC
  Filled 2016-04-07: qty 1

## 2016-04-07 MED ORDER — NITROGLYCERIN 0.4 MG SL SUBL: 0.4000 mg | SUBLINGUAL_TABLET | SUBLINGUAL | Status: DC | PRN

## 2016-04-07 MED ORDER — MORPHINE SULFATE (PF) 2 MG/ML IV SOLN
INTRAVENOUS | Status: AC
  Filled 2016-04-07: qty 1

## 2016-04-07 MED ORDER — MAGNESIUM SULFATE 2 GM/50ML IV SOLN
2.0000 g | Freq: Once | INTRAVENOUS | Status: AC
  Administered 2016-04-07: 2 g via INTRAVENOUS
  Filled 2016-04-07: qty 50

## 2016-04-07 NOTE — Progress Notes (Addendum)
PROGRESS NOTE  Katie [name] XBJ:[id number] DOB: 11/20/1965 DOA: 04/05/2016 PCP: Lonia Blood, MD  HPI/Recap of past 24 hours:  Planned discharge cancelled, patient has several  episodes of chest pain with associated bradycardia (sinus arrhythmia, few junctional rhythm), interval ekg changes t flattening inversion inferior leads most prominent, patient reported with associated diaphoresis and sob, feeling fatigue after the episodes.  On physical exam dose has chest wall tenderness and thoracic spine tenderness, cardiology consulted, if cleared by cardiology, may need spine MRI.   Assessment/Plan: Principal Problem:   Chest pain Active Problems:   Essential hypertension, benign   Hypokalemia   Pulmonary embolism on long-term anticoagulation therapy (HCC)   Pain in the chest  Chest pain,  no sure if cardiac, troponin negativex3, echo unremarkable, but does has st/t flattening ,inversion especially inferior leads.  Chest pain with associated bradycardia, diaphoresis, feeling sob, patient appear having significant fatigue after each episodes. Cardiology consulted, other differential including GERD, MS pain /thoracic nerve impingement/anxiety. Consider mri spine.  Hypokalemia/hypomagnesemia: replace k/mag  Essential hypertension, benign On Lasix and KCl Rx Monitor BPs   Pulmonary embolism on long-term anticoagulation therapy (HCC) Continue Xarelto Rx   Chronic Pain- Sciatica of BLEs, and Radiculopathy of RUE  On Oxycodone Rx See [facility]ul[facility] Pont and [facility] as Outpt and receives Injections    DVT Prophylaxis On Xarelto Rx  Code Status: full  Family Communication: patient and husband  Disposition Plan: pending   Consultants:  cardiology  Procedures:  none  Antibiotics:  none   Objective: BP 152/84 mmHg  Pulse 42   Temp(Src) 98.5 F (36.9 C) (Oral)  Resp 18  Ht  (1.651 m)  Wt 117.255 kg (258 lb 8 oz)  BMI 43.02 kg/m2  SpO2 100% No intake or output data in the 24 hours ending 04/07/16 1819 Filed Weights   04/06/16 0111  Weight: 117.255 kg (258 lb 8 oz)    Exam:   General:  weak  Cardiovascular: RRR  Respiratory: CTABL, does has chest wall tenderness and thoracic spine tenderness, no arm weakness  Abdomen: Soft/ND/NT, positive BS  Musculoskeletal: No Edema  Neuro: aaox3  Data Reviewed: Basic Metabolic Panel:  Recent Labs Lab 04/05/16 2135 04/06/16 0143 04/07/16 0449  NA 137 135 136  K 3.3* 3.8 4.5  CL 102 103 102  CO2 GLUCOSE 113* 107* 104*  BUN CREATININE 0.71 0.58 0.49  CALCIUM 8.7* 8.4* 8.5*  MG  --   --  1.6*   Liver Function Tests: No results for input(s): AST, ALT, ALKPHOS, BILITOT, PROT, ALBUMIN in the last 168 hours. No results for input(s): LIPASE, AMYLASE in the last 168 hours. No results for input(s): AMMONIA in the last 168 hours. CBC:  Recent Labs Lab 04/05/16 2135 04/06/16 0143  WBC 8.5 7.7  HGB 11.9* 11.3*  HCT 36.7 32.9*  MCV 88.6 86.1  PLT 286 281   Cardiac Enzymes:    Recent Labs Lab 04/06/16 0143 04/06/16 0654 04/06/16 1326  TROPONINI <0.03 <0.03 <0.03   BNP (last 3 results) No results for input(s): BNP in the last 8760 hours.  ProBNP (last 3 results) No results for input(s): PROBNP in the last 8760 hours.  CBG: No results for input(s): GLUCAP in the last 168 hours.  No results found for this or any previous visit (from the past 240 hour(s)).   Studies: No results found.  Scheduled Meds: . aspirin EC  325 mg Oral Daily  . furosemide  40  mg Oral Daily  . morphine      . pantoprazole  40 mg Oral Daily  . potassium chloride SA  20 mEq Oral BID  . rivaroxaban  20 mg Oral Q supper  . sodium chloride flush  3 mL Intravenous Q12H  . vitamin C  250 mg Oral Daily    Continuous Infusions:    Time  spent: 35mins  Milanie Rosenfield MD, PhD  [facility] Pager [phone]. If 7PM-7AM, please contact night-coverage at [email or web address], password [email or web address] 04/07/2016, 6:19 PM

## 2016-04-07 NOTE — Discharge Summary (Addendum)
Discharge Summary  Katie [name] KGM:[id number] DOB: Apr 03, 1966  PCP: Lonia Blood, MD  Admit date: 04/05/2016 Discharge date: 04/08/2016  Time spent: <1mins  Recommendations for Outpatient Follow-up:  1. F/u with PMD within a week  for hospital discharge follow up, pmd to continue follow for anxiety management. 2. F/u with orthopedic surgery for chronic pain  Discharge Diagnoses:  Active Hospital Problems   Diagnosis Date Noted  . Chest pain 11/25/2011  . Bradycardia   . Pulmonary embolism on long-term anticoagulation therapy (HCC) 04/06/2016  . Pain in the chest   . Hypokalemia 11/25/2011  . Essential hypertension, benign 01/17/2010    Resolved Hospital Problems   Diagnosis Date Noted Date Resolved  No resolved problems to display.    Discharge Condition: stable  Diet recommendation: heart healthy  Filed Weights   04/06/16 0111  Weight: 117.255 kg (258 lb 8 oz)    History of present illness:  Katie [name] is a 50 y.o. female with a history of HTN, Pulmonary Embolism on Xarelto Rx, Bipolar Disorder, and Chronic Pain syndrome who presents to the Ed with complaints of Central Chest pain rated at 9/10 and described as Chest Tightness since 8 PM. She reports having associated symptoms of SOB, and Nausea with Diaphoresis. The pain radiated into her back and left Arm. The initial Cardia Workup in the ED was negative and she was referred for further evaluation  Hospital Course:  Principal Problem:   Chest pain Active Problems:   Essential hypertension, benign   Hypokalemia   Pulmonary embolism on long-term anticoagulation therapy (HCC)   Pain in the chest   Bradycardia  Chest pain, noncardiac troponin negativex3, echo unremarkable, but does has st/t flattening ,inversion especially inferior leads.  Chest pain with associated bradycardia, diaphoresis, feeling sob, patient appear having significant fatigue after each episodes. Cardiology consulted who advised chest  pain is less likely cardiac, recommend f/u with pmd. MRI thoracic spine: 1. No acute abnormality within the thoracic spine. 2. Mild degenerative disc bulge at T9-10 without significant stenosis. No other significant degenerative changes within the thoracic spine. No significant canal or foraminal encroachment. Chest pain likely multifactorial including anxiety attack, gerd, musculoskeletal ( does has chest wall pain on exam),   Hypokalemia/hypomagnesemia: replace k/mag  Essential hypertension, benign On Lasix and KCl Rx   Pulmonary embolism on long-term anticoagulation therapy (HCC) Continue Xarelto Rx   Chronic Pain- Sciatica of BLEs, and Radiculopathy of RUE  On Oxycodone Rx See [facility] as Outpt and receives Injections  Body mass index is 43.02 kg/(m^2)., diet and exercise    Anxiety/depression, h/o bipolar? : continue follow up with pmd.   DVT Prophylaxis On Xarelto Rx  Code Status: full  Family Communication: patient and husband  Disposition Plan: home on 6/3   Consultants:  cardiology  Procedures:  none  Antibiotics:  none   Discharge Exam: BP 136/81 mmHg  Pulse 61  Temp(Src) 98 F (36.7 C) (Oral)  Resp 18  Ht 5\' 5"  (1.651 m)  Wt 117.255 kg (258 lb 8 oz)  BMI 43.02 kg/m2  SpO2 100%   General: NAD, ambulated in hall   Cardiovascular: RRR  Respiratory: CTABL, does has chest wall tenderness and thoracic spine tenderness, no arm weakness  Abdomen: Soft/ND/NT, positive BS  Musculoskeletal: No Edema  Neuro: aaox3   Discharge Instructions You were cared for by a hospitalist during your hospital stay. If you have any questions about your discharge medications or the care you received while you were  in the hospital after you are discharged, you can call the unit and asked to speak  with the hospitalist on call if the hospitalist that took care of you is not available. Once you are discharged, your primary care physician will handle any further medical issues. Please note that NO REFILLS for any discharge medications will be authorized once you are discharged, as it is imperative that you return to your primary care physician (or establish a relationship with a primary care physician if you do not have one) for your aftercare needs so that they can reassess your need for medications and monitor your lab values.      Discharge Instructions    Diet - low sodium heart healthy    Complete by:  As directed      Diet - low sodium heart healthy    Complete by:  As directed      Increase activity slowly    Complete by:  As directed      Increase activity slowly    Complete by:  As directed             Medication List    STOP taking these medications        potassium chloride SA 20 MEQ tablet  Commonly known as:  K-DUR,KLOR-CON     predniSONE 10 MG tablet  Commonly known as:  DELTASONE      TAKE these medications        albuterol 108 (90 Base) MCG/ACT inhaler  Commonly known as:  PROVENTIL HFA;VENTOLIN HFA  Inhale 1-2 puffs into the lungs every 6 (six) hours as needed for wheezing or shortness of breath.     aspirin 81 MG chewable tablet  Chew 81 mg by mouth every morning.     diazepam 5 MG tablet  Commonly known as:  VALIUM  Take 5 mg by mouth at bedtime as needed for anxiety.     fluticasone 50 MCG/ACT nasal spray  Commonly known as:  FLONASE  instill 1 spray into each nostril twice a day as needed for allergies     furosemide 40 MG tablet  Commonly known as:  LASIX  Take 1 tablet (40 mg total) by mouth daily.     guaiFENesin 600 MG 12 hr tablet  Commonly known as:  MUCINEX  Take 600 mg by mouth 2 (two) times daily as needed for cough.     naproxen sodium 220 MG tablet  Commonly known as:  ANAPROX  Take 440 mg by mouth 2 (two) times daily as  needed (pain).     nitroGLYCERIN 0.4 MG SL tablet  Commonly known as:  NITROSTAT  Place 1 tablet (0.4 mg total) under the tongue every 5 (five) minutes as needed for chest pain.     omeprazole 40 MG capsule  Commonly known as:  PRILOSEC  Take 40 mg by mouth daily.     Oxycodone HCl 10 MG Tabs  Take 1 tablet by mouth every 8 to 12 hours if needed for pain     phentermine 37.5 MG tablet  Commonly known as:  ADIPEX-P  Take 37.5 mg by mouth daily before breakfast. Reported on 04/05/2016     Potassium Chloride ER 20 MEQ Tbcr  Take 40 mEq by mouth daily.     rivaroxaban 20 MG Tabs tablet  Commonly known as:  XARELTO  Take 1 tablet (20 mg total) by mouth daily with supper. Starting 10/18, take  daily     THERAFLU COLD &  COUGH PO  Take 30 mLs by mouth daily as needed (flu symptoms).     vitamin C 100 MG tablet  Take 200 mg by mouth daily.       Allergies  Allergen Reactions  . Percocet [Oxycodone-Acetaminophen] Hives    From the Tylenol, she can take oxycodone by itself  . Tylenol [Acetaminophen] Hives and Swelling  . Vicodin [Hydrocodone-Acetaminophen] Hives    Hives & swelling   Follow-up Information    Follow up with GARBA,LAWAL, MD In 1 week.   Specialty:  Internal Medicine   Why:  hospital discharge follow up, please follow up with pmd for management of anxiety.    Contact information:   [address] [place] [address] [phone]        The results of significant diagnostics from this hospitalization (including imaging, microbiology, ancillary and laboratory) are listed below for reference.    Significant Diagnostic Studies: Dg Chest 2 View  04/05/2016  CLINICAL DATA:  Central chest pain and shortness of breath, onset 2 hours prior. EXAM: CHEST  2 VIEW COMPARISON:  10/30/2015 FINDINGS: The cardiomediastinal contours are unchanged with mild cardiomegaly. The lungs are clear. Pulmonary vasculature is normal. No consolidation, pleural effusion, or  pneumothorax. No acute osseous abnormalities are seen. IMPRESSION: Stable mild cardiomegaly.  No localizing pulmonary process. Electronically Signed   By: Rubye Oaks M.D.   On: 04/05/2016 22:33   Mr Thoracic Spine Wo Contrast  04/07/2016  CLINICAL DATA:  Initial evaluation for acute thoracic spine tenderness. EXAM: MRI THORACIC SPINE WITHOUT CONTRAST TECHNIQUE: Multiplanar, multisequence MR imaging of the thoracic spine was performed. No intravenous contrast was administered. COMPARISON:  None. FINDINGS: Alignment: Vertebral bodies are normally aligned with preservation of the normal thoracic kyphosis. No listhesis or malalignment. Vertebrae: Vertebral body heights well maintained. No evidence for acute, subacute, or chronic fracture. Signal intensity within the vertebral body bone marrow is normal. No focal osseous lesion. No marrow edema. Mild reactive endplate changes at the anterior aspect of the T9-10 interspace. Cord: Signal intensity within the thoracic spinal cord is normal. Conus medullaris terminates inferior to T12. Paraspinal and other soft tissues: Paraspinous soft tissues demonstrate no acute abnormality. No soft tissue edema. Visualized aorta on without acute abnormality. Visualized visceral structures unremarkable. Partially visualized lungs are clear. Disc levels: T3-4:  Posterior element hypertrophy without significant stenosis. T9-10: Degenerative disc desiccation with mild disc bulge. Posterior hypertrophy. No significant stenosis. Reactive endplate changes. IMPRESSION: 1. No acute abnormality within the thoracic spine. 2. Mild degenerative disc bulge at T9-10 without significant stenosis. No other significant degenerative changes within the thoracic spine. No significant canal or foraminal encroachment. Electronically Signed   By: Rise Mu M.D.   On: 04/07/2016 22:08    Microbiology: No results found for this or any previous visit (from the past 240 hour(s)).    Labs: Basic Metabolic Panel:  Recent Labs Lab 04/05/16 2135 04/06/16 0143 04/07/16 0449 04/08/16 0453  NA 137 135 136 136  K 3.3* 3.8 4.5 4.0  CL 102 103 102 102  CO2 28 24 28 27   GLUCOSE 113* 107* 104* 108*  BUN 11 12 14 13   CREATININE 0.71 0.58 0.49 0.50  CALCIUM 8.7* 8.4* 8.5* 8.6*  MG  --   --  1.6* 1.8   Liver Function Tests: No results for input(s): AST, ALT, ALKPHOS, BILITOT, PROT, ALBUMIN in the last 168 hours. No results for input(s): LIPASE, AMYLASE in the last 168 hours. No results for input(s): AMMONIA in the  last 168 hours. CBC:  Recent Labs Lab 04/05/16 2135 04/06/16 0143  WBC 8.5 7.7  HGB 11.9* 11.3*  HCT 36.7 32.9*  MCV 88.6 86.1  PLT 286 281   Cardiac Enzymes:  Recent Labs Lab 04/06/16 0143 04/06/16 0654 04/06/16 1326  TROPONINI <0.03 <0.03 <0.03   BNP: BNP (last 3 results) No results for input(s): BNP in the last 8760 hours.  ProBNP (last 3 results) No results for input(s): PROBNP in the last 8760 hours.  CBG: No results for input(s): GLUCAP in the last 168 hours.     SignedAlbertine Grates:  Bradey Luzier MD, PhD  [facility] 04/08/2016, 12:38 PM

## 2016-04-07 NOTE — Progress Notes (Signed)
PCP was notified that the patient has a Thoracic MR scheduled.

## 2016-04-07 NOTE — Care Management Note (Signed)
Case Management Note  Patient Details  Name: Katie [name] MRN: [id number] Date of Birth: 05/14/1966  Subjective/Objective:49 y/o f admitted w/chest pain. From home.                    Action/Plan:d/c plan home.No needs or orders.   Expected Discharge Date:                  Expected Discharge Plan:  Home/Self Care  In-House Referral:     Discharge planning Services  CM Consult  Post Acute Care Choice:    Choice offered to:     DME Arranged:    DME Agency:     HH Arranged:    HH Agency:     Status of Service:  Completed, signed off  Medicare Important Message Given:    Date Medicare IM Given:    Medicare IM give by:    Date Additional Medicare IM Given:    Additional Medicare Important Message give by:     If discussed at Long Length of Stay Meetings, dates discussed:    Additional Comments:  Lanier ClamMahabir, Jermal Dismuke, RN 04/07/2016, 9:28 AM

## 2016-04-07 NOTE — Progress Notes (Signed)
Patient returned from MRI. RN was with the patient during the MRi  No problems , patient remained in SR throughout the procedure.

## 2016-04-07 NOTE — Progress Notes (Signed)
CMT notified RN of HR in upper 30's. At the same time patient c/o chest pain. Patient was diaphoretic and SOB. MD made aware, nitro given x 2, and EKG obtained. EKG did not show any abnormality. After 2 Nitro pain decreased from 10 to 6.  MD came to see patient after the episode.  Julio SicksK. Doni Widmer RN

## 2016-04-07 NOTE — Progress Notes (Signed)
CMT again informed RN of HR in upper 30's and at the same time patient was c/o of chest pain and was diaphoretic. MD made aware, EKG obtained and Nitro given. MD said she would call Cardiology.

## 2016-04-07 NOTE — Consult Note (Signed)
CARDIOLOGY CONSULT NOTE   Patient ID: Katie [name] MRN: [id number] DOB/AGE: July 04, 1966 50 y.o.  Admit date: 04/05/2016  Primary Physician   Katie Blood, MD Primary Cardiologist   Dr Katie [name] - saw in consult 11/2011 Reason for Consultation   Chest pain Requesting MD: Dr Katie [name]  WUJ:WJXB D [name] is a 50 y.o. year old female with a history of hypertension, GERD, bipolar disorder, depression, pulmonary embolism on Xarelto, IBS, right carpal tunnel syndrome.  Had chest pain when in [place], cath reportedly normal. Admitted 11/2011 for chest pain, Dr Katie [name] saw in consult, sx more consistent with chest wall pain,  Admitted 07/2013 w/ chest pain, PE diagnosed. Admitted 01/2015 w/ chest pain, ez neg, ECG ok, sx felt MS in origin. Seen in ER 05/2015 for chest pain, sx felt MS in origin. Seen in ER 10/2015 for chest pain and weakness, felt dehydrated, CP felt MS, d/c home after hydration.  05/16 pt had steroid injection R shoulder but was told the problem was in her back. Pt [name] had nerve injections x 2 in her neck, Dr Katie [name] feels this is causing her chest pain. The R shoulder pain starts in her neck and goes to her fingers. These symptoms have been helped by steroid shots in the past, this time, no difference.  She began having episodes of chest pain in 10/2015. The chest pain happened again April but was not as severe.   The symptoms began again Wednesday (05/31). They are all about the same. Chest pressure, then throbbing and feels her heart pounding. She feel she is burning up and will start to sweat. The pain goes through to her back. She [name] not tried any additional medication for the pain, but chronically takes oxycodone and Prilosec daily. It reaches 10/10. She came to the ER because the pain was severe and she was presyncopal again. The pain is worse with inspiration. She vomited x 1, [name] not done this before. The pain will get worse if she sits up very high.   In the ER, SL NTG x 3 no help.  She [name] had a headache since being given the nitro. Dilaudid helped the pain. She was placed on oxygen. She remained pain-free until today. Today, she [name] had 3 episodes of pain, she was given oxygen.   She is not aware of a low heart rate. She [name] not been dizzy or light-headed today. Her normal heart rate is in the 60s, sometimes in the 50s. She was surprised to learn that her heart rate was low.   She used to weigh 400 lbs. She [name] lost a great deal of weight. She [name] been on diuretics for BP control and fluid retention. She [name] had problems with hypokalemia in the past.   Past Medical History  Diagnosis Date  . OA (osteoarthritis)   . HTN (hypertension)   . Obesity   . Bipolar 1 disorder (HCC)   . Depression   . Depression   . Back pain   . Anxiety   . Sciatica   . PE (pulmonary embolism)     oct 2014  . Chronic pain entered 08/15/2015    Treated with Oxycodone     Past Surgical History  Procedure Laterality Date  . Cholecystectomy  2007  . Cardiac catheterization  2009    normal; in [place]  . Cesarean section  2002    x 2, 1997 and 2002  . Knee surgery Right 2005  . Tonsillectomy    .  Tympanostomy tube placement      Allergies  Allergen Reactions  . Percocet [Oxycodone-Acetaminophen] Hives  . Vicodin [Hydrocodone-Acetaminophen] Hives    Hives & swelling    I have reviewed the patient's current medications . aspirin EC  325 mg Oral Daily  . furosemide  40 mg Oral Daily  . morphine      . pantoprazole  40 mg Oral Daily  . potassium chloride SA  20 mEq Oral BID  . rivaroxaban  20 mg Oral Q supper  . sodium chloride flush  3 mL Intravenous Q12H  . vitamin C  250 mg Oral Daily     sodium chloride, acetaminophen **OR** acetaminophen, diazepam, HYDROmorphone (DILAUDID) injection, nitroGLYCERIN, ondansetron **OR** ondansetron (ZOFRAN) IV, oxyCODONE, sodium chloride flush  Medication Sig  albuterol (PROVENTIL HFA;VENTOLIN HFA) 108 (90 BASE) MCG/ACT inhaler  Inhale 1-2 puffs into the lungs every 6 (six) hours as needed for wheezing or shortness of breath.   Ascorbic Acid (VITAMIN C) 100 MG tablet Take 200 mg by mouth daily.  aspirin 81 MG chewable tablet Chew 81 mg by mouth every morning.  diazepam (VALIUM) 5 MG tablet Take 5 mg by mouth at bedtime as needed for anxiety.   fluticasone (FLONASE) 50 MCG/ACT nasal spray instill 1 spray into each nostril twice a day as needed for allergies  furosemide (LASIX) 40 MG tablet Take 1 tablet (40 mg total) by mouth daily.  naproxen sodium (ANAPROX) 220 MG tablet Take 440 mg by mouth 2 (two) times daily as needed (pain).   omeprazole (PRILOSEC) 40 MG capsule Take 40 mg by mouth daily.  Oxycodone HCl 10 MG TABS Take 1 tablet by mouth every 8 to 12 hours if needed for pain  phentermine (ADIPEX-P) 37.5 MG tablet Take 37.5 mg by mouth daily before breakfast. Reported on 04/05/2016  Phenylephrine-Pheniramine-DM (THERAFLU COLD & COUGH PO) Take 30 mLs by mouth daily as needed (flu symptoms).  potassium chloride SA (K-DUR,KLOR-CON) 20 MEQ tablet Take 20 mEq by mouth 2 (two) times daily.   Rivaroxaban (XARELTO) 20 MG TABS tablet Take 1 tablet (20 mg total) by mouth daily with supper. Starting 10/18, take 20mg  daily  guaiFENesin (MUCINEX) 600 MG 12 hr tablet Take 600 mg by mouth 2 (two) times daily as needed for cough.  nitroGLYCERIN (NITROSTAT) 0.4 MG SL tablet Place 1 tablet (0.4 mg total) under the tongue every 5 (five) minutes as needed for chest pain.  Potassium Chloride ER 20 MEQ TBCR Take 40 mEq by mouth daily. Patient not taking: Reported on 04/05/2016  predniSONE (DELTASONE) 10 MG tablet Take q day 6,5,4,3,2,1 Patient not taking: Reported on 04/05/2016     Social History   Social History  . Marital Status: Single    Spouse Name: N/A  . Number of Children: N/A  . Years of Education: N/A   Occupational History  . Housewife    Social History Main Topics  . Smoking status: Never Smoker   . Smokeless  tobacco: Never Used     Comment: second hand smoke  . Alcohol Use: 0.0 oz/week    0 Standard drinks or equivalent per week     Comment: 2-3 x week [name] a glass of wine  . Drug Use: No  . Sexual Activity: Not on file   Other Topics Concern  . Not on file   Social History Narrative   Lives with husband, daughter and stepson.    Family Status  Relation Status Death Age  . Mother Deceased 10  MI  . Father Deceased 6444    diabetes  . Sister Deceased    Family History  Problem Relation Age of Onset  . Heart disease Mother   . Diabetes Father   . Asthma Sister     also had rheumatic fever as a child, died age 48     ROS:  Full 14 point review of systems complete and found to be negative unless listed above.  Physical Exam: [name] pressure 128/61, pulse 75, temperature 98.5 F (36.9 C), temperature source Oral, resp. rate 18, height 5\' 5"  (1.651 m), weight 258 lb 8 oz (117.255 kg), SpO2 100 %.  General: Well developed, well nourished, female in no acute distress Head: Eyes PERRLA, No xanthomas.   Normocephalic and atraumatic, oropharynx without edema or exudate. Dentition: good Lungs: clear bilaterally; sternum tender to palpation Heart: HRRR S1 S2, no rub/gallop, 2/6 murmur. pulses are 2+ all 4 extrem.   Neck: No carotid bruits. No lymphadenopathy.  JVD not elevated Abdomen: Bowel sounds present, abdomen soft and non-tender without masses or hernias noted. Msk:  No spine or cva tenderness. No weakness, no joint deformities or effusions. Extremities: No clubbing or cyanosis. No edema.  Neuro: Alert and oriented X 3. No focal deficits noted. Psych:  Good affect, responds appropriately Skin: No rashes or lesions noted.  Labs:   Lab Results  Component Value Date   WBC 7.7 04/06/2016   HGB 11.3* 04/06/2016   HCT 32.9* 04/06/2016   MCV 86.1 04/06/2016   PLT 281 04/06/2016     Recent Labs Lab 04/07/16 0449  NA 136  K 4.5  CL 102  CO2 28  BUN 14  CREATININE 0.49   CALCIUM 8.5*  GLUCOSE 104*   MAGNESIUM  Date Value Ref Range Status  04/07/2016 1.6* 1.7 - 2.4 mg/dL Final    Recent Labs  16/08/9605/01/17 0143 04/06/16 0654 04/06/16 1326  TROPONINI <0.03 <0.03 <0.03    Recent Labs  04/05/16 2147  TROPIPOC 0.00   Lab Results  Component Value Date   CHOL 131 04/06/2016   HDL 45 04/06/2016   LDLCALC 72 04/06/2016   TRIG 71 04/06/2016   Lab Results  Component Value Date   DDIMER <0.27 04/05/2016    Echo: 04/06/2016 - Left ventricle: The cavity size was normal. Systolic function was  normal. The estimated ejection fraction was in the range of 60%  to 65%. Wall motion was normal; there were no regional wall  motion abnormalities. - Left atrium: The atrium was mildly dilated. - Pulmonary arteries: Systolic pressure was mildly increased. PA  peak pressure: 32 mm Hg (S). - no pericardial effusion  ECG:  06/02 SR, diffuse lateral T wave flattening, no sig change from 10/2015 but T waves were larger on admission  Cath: no results available  Radiology:  Dg Chest 2 View 04/05/2016  CLINICAL DATA:  Central chest pain and shortness of breath, onset 2 hours prior. EXAM: CHEST  2 VIEW COMPARISON:  10/30/2015 FINDINGS: The cardiomediastinal contours are unchanged with mild cardiomegaly. The lungs are clear. Pulmonary vasculature is normal. No consolidation, pleural effusion, or pneumothorax. No acute osseous abnormalities are seen. IMPRESSION: Stable mild cardiomegaly.  No localizing pulmonary process. Electronically Signed   By: Rubye OaksMelanie  Ehinger M.D.   On: 04/05/2016 22:33    ASSESSMENT AND PLAN:   The patient was seen today by Dr Tenny Crawoss, the patient evaluated and the data reviewed.  Principal Problem:   Chest pain - no exertional component,  Appears to  be musculoskel  Worse with breathing or sitting or when push on chest   - EF is normal, no WMA, no effusion - I am not convinced correlates with low HR        Bradycardia Intermitt.  Pt was  sleeping when I walked in  May be associated with napping, low acitvity  Not on any medicines that would lower her heart rate   BP [name] been maintained   - junctional rhythm and sinus brady - HR can drop into the 30s at times      Spine tenderness - per Dr Katie [name], may need MRI if ok w/ cards.  Otherwise, per IM Active Problems:   Essential hypertension, benign   Hypokalemia   Pulmonary embolism on long-term anticoagulation therapy (HCC)   Pain in the chest   Signed: Leanna Battles 04/07/2016 2:46 PM Beeper [phone]  Patient seen and examined    I have amended note above to reflect my findings  On exam, pt resting comfortably  LUngs are CTA  Cardiac exam:  RRR  No S3  No murmurs Chest  Extremely tender to palpation  Brings on pain Ext without edema  Pain appears to be musculoskeletal  I do not think cardiac  Rx pain med, rest Bradycardia is intermitt  Not hemodynamically destabilizing  Pt resting  Report that pt felt pain when tele called and said HR was low I am not convinced of an associatiton  I would recomm that the pt ambulate with assistance in the hallway  Plan to watch HR response on tele  Look for symptoms. At most could consider event monitor on D/C   Will continue to follow   Dietrich Pates

## 2016-04-07 NOTE — Progress Notes (Signed)
CMT once again notified RN or bradycardia and patient called the nurses station c/o of chest pain and SOB. Charge nurse went to check patient. Gave Nitro x2 and checked VS. RN spoke with RRN and Cardiology PA. Cardiology PA was made aware that the bradycardia and CP/SOB were associated. She was going to inform MD and did not feel any need for further changes to care, only to monitor and try to capture episodes on EKG. Julio SicksK. Ferry Matthis RN

## 2016-04-08 DIAGNOSIS — R0789 Other chest pain: Secondary | ICD-10-CM | POA: Diagnosis not present

## 2016-04-08 DIAGNOSIS — E876 Hypokalemia: Secondary | ICD-10-CM | POA: Diagnosis not present

## 2016-04-08 DIAGNOSIS — R001 Bradycardia, unspecified: Secondary | ICD-10-CM | POA: Diagnosis not present

## 2016-04-08 DIAGNOSIS — I2699 Other pulmonary embolism without acute cor pulmonale: Secondary | ICD-10-CM | POA: Diagnosis not present

## 2016-04-08 DIAGNOSIS — R079 Chest pain, unspecified: Secondary | ICD-10-CM | POA: Diagnosis not present

## 2016-04-08 DIAGNOSIS — I10 Essential (primary) hypertension: Secondary | ICD-10-CM | POA: Diagnosis not present

## 2016-04-08 LAB — BASIC METABOLIC PANEL
Anion gap: 7 (ref 5–15)
BUN: 13 mg/dL (ref 6–20)
CALCIUM: 8.6 mg/dL — AB (ref 8.9–10.3)
CO2: 27 mmol/L (ref 22–32)
CREATININE: 0.5 mg/dL (ref 0.44–1.00)
Chloride: 102 mmol/L (ref 101–111)
GFR calc non Af Amer: >60 mL/min (ref >60)
Glucose, Bld: 108 mg/dL — ABNORMAL HIGH (ref 65–99)
Potassium: 4 mmol/L (ref 3.5–5.1)
Sodium: 136 mmol/L (ref 135–145)

## 2016-04-08 LAB — MAGNESIUM: Magnesium: 1.8 mg/dL (ref 1.7–2.4)

## 2016-04-08 NOTE — Progress Notes (Signed)
Patient slept well. No c/o bradycardia or chest pain

## 2016-04-08 NOTE — Evaluation (Signed)
Physical Therapy Evaluation Patient Details Name: Katie [name] MRN: [id number] DOB: 03/30/1966 Today's Date: 04/08/2016   History of Present Illness  Pt admitted through ED with c/o CP and SOB.  Pt with hx of Bipolar, and chronic pain  2* sciatica  Clinical Impression  Pt admitted as above and presenting with functional mobility limitations 2* generalized weakness, chronic pain and limited endurance.  Pt would greatly benefit from use of wide RW for home to assist with stability and LE pain.    Follow Up Recommendations No PT follow up    Equipment Recommendations  Rolling walker with 5" wheels (Pt needs WIDE RW - unable to step into standard 2* hip width)    Recommendations for Other Services       Precautions / Restrictions Precautions Precautions: Fall Restrictions Weight Bearing Restrictions: No Other Position/Activity Restrictions: WVBAT      Mobility  Bed Mobility Overal bed mobility: Modified Independent             General bed mobility comments: Pt to EOB unassisted but with increased time  Transfers Overall transfer level: Needs assistance Equipment used: None Transfers: Sit to/from Stand Sit to Stand: Min guard         General transfer comment: for initial stability.  Pt states I am just really stiff like I am every morning  Ambulation/Gait Ambulation/Gait assistance: Min assist;Min guard;Supervision Ambulation Distance (Feet): 180 Feet Assistive device: 1 person hand held assist;Rolling walker (2 wheeled) Gait Pattern/deviations: Step-through pattern;Decreased step length - right;Decreased step length - left;Shuffle;Trunk flexed;Wide base of support;Antalgic Gait velocity: decr   General Gait Details: Pt ambulated 50' sans assist device but with decreased speed and increased instability.  Pt ambulated additional 130' with RW, min guard/Sup, and multiple short standing rests 2* SOB and pain.  Pt with noted improvement in stability and activity  tolerance with RW  Stairs            Wheelchair Mobility    Modified Rankin (Stroke Patients Only)       Balance Overall balance assessment: Needs assistance Sitting-balance support: No upper extremity supported;Feet supported Sitting balance-Leahy Scale: Normal     Standing balance support: No upper extremity supported Standing balance-Leahy Scale: Good                               Pertinent Vitals/Pain Pain Assessment: 0-10 Pain Score: 6  Pain Location: Back and LEs (R>L) Pain Descriptors / Indicators: Aching;Guarding Pain Intervention(s): Limited activity within patient's tolerance;Monitored during session    Home Living Family/patient expects to be discharged to:: Private residence Living Arrangements: Spouse/significant other;Children Available Help at Discharge: Family Type of Home: House Home Access: Stairs to enter Entrance Stairs-Rails: Right;Left;Can reach both Secretary/administratorntrance Stairs-Number of Steps: 5 Home Layout: One level Home Equipment: Cane - single point      Prior Function Level of Independence: Independent with assistive device(s)         Comments: Pt utilizing cane      Hand Dominance        Extremity/Trunk Assessment   Upper Extremity Assessment: Generalized weakness           Lower Extremity Assessment: Generalized weakness         Communication   Communication: No difficulties  Cognition Arousal/Alertness: Awake/alert Behavior During Therapy: WFL for tasks assessed/performed Overall Cognitive Status: Within Functional Limits for tasks assessed  General Comments      Exercises        Assessment/Plan    PT Assessment Patient needs continued PT services  PT Diagnosis Difficulty walking   PT Problem List Decreased strength;Decreased activity tolerance;Decreased balance;Decreased mobility;Decreased knowledge of use of DME;Obesity;Pain  PT Treatment Interventions DME  instruction;Gait training;Stair training;Functional mobility training;Therapeutic activities;Therapeutic exercise;Balance training;Patient/family education   PT Goals (Current goals can be found in the Care Plan section) Acute Rehab PT Goals Patient Stated Goal: HOME PT Goal Formulation: With patient Time For Goal Achievement: 04/15/16 Potential to Achieve Goals: Good    Frequency Min 3X/week   Barriers to discharge        Co-evaluation               End of Session Equipment Utilized During Treatment: Gait belt Activity Tolerance: Patient limited by fatigue;Patient limited by pain Patient left: in chair;with call bell/phone within reach;with family/visitor present Nurse Communication: Mobility status    Functional Assessment Tool Used: Clinical Judgement Functional Limitation: Mobility: Walking and moving around Mobility: Walking and Moving Around Current Status [phone]): At least 1 percent but less than 20 percent impaired, limited or restricted Mobility: Walking and Moving Around Goal Status [phone]): At least 1 percent but less than 20 percent impaired, limited or restricted    Time: 1035-1057 PT Time Calculation (min) (ACUTE ONLY): 22 min   Charges:   PT Evaluation $PT Eval Low Complexity: 1 Procedure     PT G Codes:   PT G-Codes **NOT FOR INPATIENT CLASS** Functional Assessment Tool Used: Clinical Judgement Functional Limitation: Mobility: Walking and moving around Mobility: Walking and Moving Around Current Status ([phone]): At least 1 percent but less than 20 percent impaired, limited or restricted Mobility: Walking and Moving Around Goal Status [phone]): At least 1 percent but less than 20 percent impaired, limited or restricted    Kaydince Towles 04/08/2016, 12:51 PM

## 2016-04-08 NOTE — Progress Notes (Signed)
PROGRESS NOTE  Subjective:   Pt presents with MSK pain . See consult note by Dr. Tenny Crawoss. Her pains have been relieved in the past by back injections.  She has unrelated bradycardia.   Objective:    Vital Signs:   Temp:  [98 F (36.7 C)-98.3 F (36.8 C)] 98 F (36.7 C) (06/03 0359) Pulse Rate:  [42-75] 61 (06/03 0359) Resp:  [18] 18 (06/03 0359) BP: (115-152)/(61-84) 136/81 mmHg (06/03 0359) SpO2:  [99 %-100 %] 100 % (06/03 0359) Weight:  [258 lb (117.028 kg)] 258 lb (117.028 kg) (06/02 2114)  Last BM Date: 04/05/16   24-hour weight change: Weight change:   Weight trends: Filed Weights   04/06/16 0111  Weight: 258 lb 8 oz (117.255 kg)    Intake/Output:        Physical Exam: BP 136/81 mmHg  Pulse 61  Temp(Src) 98 F (36.7 C) (Oral)  Resp 18  Ht 5\' 5"  (1.651 m)  Wt 258 lb 8 oz (117.255 kg)  BMI 43.02 kg/m2  SpO2 100%  Wt Readings from Last 3 Encounters:  04/06/16 258 lb 8 oz (117.255 kg)  04/07/16 258 lb (117.028 kg)  10/30/15 240 lb (108.863 kg)    General: Vital signs reviewed and noted.   Head: Normocephalic, atraumatic.  Eyes: conjunctivae/corneas clear.  EOM's intact.   Throat: normal  Neck:  normal   Lungs:    clear   Heart:  RR   Abdomen:  Soft, non-tender, non-distended    Extremities: No edeam    Neurologic: A&O X3, CN II - XII are grossly intact.   Psych: Normal     Labs: BMET:  Recent Labs  04/07/16 0449 04/08/16 0453  NA 136 136  K 4.5 4.0  CL 102 102  CO2 28 27  GLUCOSE 104* 108*  BUN 14 13  CREATININE 0.49 0.50  CALCIUM 8.5* 8.6*  MG 1.6* 1.8    Liver function tests: No results for input(s): AST, ALT, ALKPHOS, BILITOT, PROT, ALBUMIN in the last 72 hours. No results for input(s): LIPASE, AMYLASE in the last 72 hours.  CBC:  Recent Labs  04/05/16 2135 04/06/16 0143  WBC 8.5 7.7  HGB 11.9* 11.3*  HCT 36.7 32.9*  MCV 88.6 86.1  PLT 286 281    Cardiac Enzymes:  Recent Labs  04/06/16 0143  04/06/16 0654 04/06/16 1326  TROPONINI <0.03 <0.03 <0.03    Coagulation Studies: No results for input(s): LABPROT, INR in the last 72 hours.  Other: Invalid input(s): POCBNP  Recent Labs  04/05/16 2135  DDIMER <0.27   No results for input(s): HGBA1C in the last 72 hours.  Recent Labs  04/06/16 0143  CHOL 131  HDL 45  LDLCALC 72  TRIG 71  CHOLHDL 2.9   No results for input(s): TSH, T4TOTAL, T3FREE, THYROIDAB in the last 72 hours.  Invalid input(s): FREET3 No results for input(s): VITAMINB12, FOLATE, FERRITIN, TIBC, IRON, RETICCTPCT in the last 72 hours.   Other results:  tele  ( personally reviewed )   - NSR   Medications:    Infusions:    Scheduled Medications: . aspirin EC  325 mg Oral Daily  . furosemide  40 mg Oral Daily  . pantoprazole  40 mg Oral Daily  . potassium chloride SA  20 mEq Oral BID  . rivaroxaban  20 mg Oral Q supper  . sodium chloride flush  3 mL Intravenous Q12H  . vitamin C  250 mg Oral Daily  Assessment/ Plan:   Principal Problem:   Chest pain Active Problems:   Essential hypertension, benign   Hypokalemia   Pulmonary embolism on long-term anticoagulation therapy (HCC)   Pain in the chest   Bradycardia  1. Non cardiac chest pain :  troponins are negative ,  Has had a cath and several stress tests in the past  - all of which were reportedly negative I do not think she needs further cardiac eval at this time  2. Sinus bradycardia.   Asymptomatic.  No syncope. Follow up with her medical doctor   Will sign off. Call for questions  Disposition: plans per IM.  Length of Stay:   Alvia Grove., MD, [facility] 04/08/2016, 7:47 AM Office [phone] Pager [phone]

## 2016-05-16 ENCOUNTER — Encounter (HOSPITAL_COMMUNITY): Payer: Self-pay

## 2016-05-16 ENCOUNTER — Emergency Department (HOSPITAL_COMMUNITY)
Admission: EM | Admit: 2016-05-16 | Discharge: 2016-05-17 | Disposition: A | Discharge status: Home / Self Care | Payer: Medicaid Other | Attending: Emergency Medicine | Admitting: Emergency Medicine

## 2016-05-16 DIAGNOSIS — G8929 Other chronic pain: Secondary | ICD-10-CM | POA: Diagnosis not present

## 2016-05-16 DIAGNOSIS — I10 Essential (primary) hypertension: Secondary | ICD-10-CM | POA: Insufficient documentation

## 2016-05-16 DIAGNOSIS — M542 Cervicalgia: Secondary | ICD-10-CM | POA: Insufficient documentation

## 2016-05-16 DIAGNOSIS — Z79899 Other long term (current) drug therapy: Secondary | ICD-10-CM | POA: Diagnosis not present

## 2016-05-16 DIAGNOSIS — Z7982 Long term (current) use of aspirin: Secondary | ICD-10-CM | POA: Insufficient documentation

## 2016-05-16 DIAGNOSIS — R51 Headache: Secondary | ICD-10-CM | POA: Insufficient documentation

## 2016-05-16 DIAGNOSIS — M549 Dorsalgia, unspecified: Secondary | ICD-10-CM | POA: Diagnosis not present

## 2016-05-16 NOTE — ED Notes (Signed)
Pt complains of neck and back pain

## 2016-05-17 MED ORDER — OXYCODONE HCL 5 MG PO TABS
10.0000 mg | ORAL_TABLET | Freq: Once | ORAL | Status: AC
  Administered 2016-05-17: 10 mg via ORAL
  Filled 2016-05-17: qty 2

## 2016-05-17 MED ORDER — OXYCODONE HCL 10 MG PO TABS: 10.0000 mg | ORAL_TABLET | Freq: Two times a day (BID) | ORAL | Status: DC | PRN

## 2016-05-17 MED ORDER — PREDNISONE 20 MG PO TABS: 40.0000 mg | ORAL_TABLET | Freq: Every day | ORAL | Status: DC

## 2016-05-17 NOTE — ED Notes (Signed)
Patient is awaiting her ride for discharge transportation since she has been given a narcotic.

## 2016-05-17 NOTE — ED Provider Notes (Signed)
CSN: [id number]     Arrival date & time 05/16/16  2227 History  By signing my name below, I, Bridgette Habermann, attest that this documentation has been prepared under the direction and in the presence of [employer], PA-C. Electronically Signed: Bridgette Habermann, ED Scribe. 05/17/2016. 2:01 AM.   Chief Complaint  Patient presents with  . Neck Pain    The history is provided by the patient. No language interpreter was used.   HPI Comments: Katie [name] is a 50 y.o. female with h/o osteoarthritis, sciatica, HTN, and chronic pain who presents to the Emergency Department complaining of her worsening chronic neck and back pain. Pt describes the pain as sharp and stabbing. She has had this chronic pain for a year. Pt regularly takes oxycodone for her pain but reports she ran out last night. She was at the hospital beginning of June and had an MRI done. Pt has injections for her pain and her last one was a month ago, she notes that they provide her no relief. Pt has an orthopedic doctor she regularly follows up with and an appointment with her PCP on 05/20/16. Pt denies fever, new or worsening extremity numbness/weakness, and bowel and bladder incontinence.   Orthopedic doctor: Dr. Farris Has, J at [facility] PCP: Dr. Colin Benton  Past Medical History  Diagnosis Date  . OA (osteoarthritis)   . HTN (hypertension)   . Obesity   . Bipolar 1 disorder (HCC)   . Depression   . Depression   . Back pain   . Anxiety   . Sciatica   . PE (pulmonary embolism)     oct 2014  . Chronic pain entered 08/15/2015    Treated with Oxycodone   Past Surgical History  Procedure Laterality Date  . Cholecystectomy  2007  . Cardiac catheterization  2009    normal; in [place]  . Cesarean section  2002    x 2, 1997 and 2002  . Knee surgery Right 2005  . Tonsillectomy    . Tympanostomy tube placement     Family History  Problem Relation Age of Onset  . Heart disease Mother   . Diabetes Father   . Asthma Sister      also had rheumatic fever as a child, died age 21   Social History  Substance Use Topics  . Smoking status: Never Smoker   . Smokeless tobacco: Never Used     Comment: second hand smoke  . Alcohol Use: 0.0 oz/week    0 Standard drinks or equivalent per week     Comment: 2-3 x week has a glass of wine   OB History    No data available     Review of Systems  Constitutional: Negative for fever.  Genitourinary: Negative for urgency.  Musculoskeletal: Positive for back pain and neck pain.  Neurological: Positive for headaches.    Allergies  Percocet; Tylenol; and Vicodin  Home Medications   Prior to Admission medications   Medication Sig Start Date End Date Taking? Authorizing Provider  aspirin 81 MG chewable tablet Chew 81 mg by mouth every morning.   Yes Historical Provider, MD  diazepam (VALIUM) 5 MG tablet Take 5 mg by mouth at bedtime as needed for anxiety.  03/02/16  Yes Historical Provider, MD  fluticasone (FLONASE) 50 MCG/ACT nasal spray instill 1 spray into each nostril twice a day as needed for allergies 01/13/16  Yes Historical Provider, MD  furosemide (LASIX) 40 MG tablet Take 1  tablet (40 mg total) by mouth daily. 08/02/13  Yes Zannie Cove, MD  nitroGLYCERIN (NITROSTAT) 0.4 MG SL tablet Place 1 tablet (0.4 mg total) under the tongue every 5 (five) minutes as needed for chest pain. 04/07/16  Yes Albertine Grates, MD  omeprazole (PRILOSEC) 40 MG capsule Take 40 mg by mouth daily.   Yes Historical Provider, MD  Oxycodone HCl 10 MG TABS Take 10 mg by mouth every 8 to 12 hours if needed for pain 03/20/16  Yes Historical Provider, MD  phentermine (ADIPEX-P) 37.5 MG tablet Take 37.5 mg by mouth daily before breakfast. Reported on 04/05/2016   Yes Historical Provider, MD  potassium chloride SA (K-DUR,KLOR-CON) 20 MEQ tablet Take 20 mEq by mouth 2 (two) times daily. 05/03/16  Yes Historical Provider, MD  Rivaroxaban (XARELTO) 20 MG TABS tablet Take 1 tablet (20 mg total) by mouth daily with  supper. Starting 10/18, take 20mg  daily 08/23/13  Yes Zannie Cove, MD   BP 112/70 mmHg  Pulse 86  Temp(Src) 98.5 F (36.9 C) (Oral)  Ht 5\' 5"  (1.651 m)  Wt 245 lb (111.131 kg)  BMI 40.77 kg/m2  SpO2 95%   Physical Exam  Constitutional: She is oriented to person, place, and time. She appears well-developed and well-nourished. No distress.  Nontoxic appearing and in no distress.  HENT:  Head: Normocephalic and atraumatic.  Eyes: Conjunctivae and EOM are normal. No scleral icterus.  Neck: Normal range of motion.  No nuchal rigidity or meningismus. No bony deformities, step-offs, or crepitus to the cervical midline. Normal range of motion exhibited.  Cardiovascular: Normal rate, regular rhythm and intact distal pulses.   Pulmonary/Chest: Effort normal. No respiratory distress.  Respirations even and unlabored. Chest expansion symmetric.  Musculoskeletal: Normal range of motion.  No bony deformities, step-offs, or crepitus noted to the thoracic or lumbosacral midline.  Neurological: She is alert and oriented to person, place, and time. She exhibits normal muscle tone. Coordination normal.  Grip strength 5/5 bilaterally. Normal strength against resistance in all major muscle groups bilaterally. Patient ambulatory with mildly antalgic gait. Sensation to light touch intact in all extremities.  Skin: Skin is warm and dry. No rash noted. She is not diaphoretic. No erythema. No pallor.  Psychiatric: She has a normal mood and affect. Her behavior is normal.  Nursing note and vitals reviewed.   ED Course  Procedures  DIAGNOSTIC STUDIES: Oxygen Saturation is 95% on RA, poor by my interpretation.    COORDINATION OF CARE: 1:50 AM Discussed treatment plan with pt at bedside which includes pain management referral and Rx of pain medication and pt agreed to plan.  Labs Review Labs Reviewed - No data to display  Imaging Review No results found.   I have personally reviewed and evaluated  these images and lab results as part of my medical decision-making.   EKG Interpretation None      MDM   Final diagnoses:  Chronic pain    50 year old female presents to the emergency Department complaining of neck and back pain. She has a history of chronic neck and back pain and states that she ran out of her oxycodone yesterday. She has had no relief of pain with other medications taken prior to arrival. Patient is neurovascularly intact. She is ambulatory. No red flags or signs concerning for cauda equina. No concern for acute or emergent process; this appears to be an exacerbation of patient's known chronic pain. She has been advised to f/u with her PCP. Return precautions discussed  and provided. Patient discharged in satisfactory condition with no unaddressed concerns.  I personally performed the services described in this documentation, which was scribed in my presence. The recorded information has been reviewed and is accurate.     Antony MaduraKelly Burtis Imhoff, PA-C 05/30/16 1919  April Palumbo, MD 06/10/16 [phone]

## 2016-05-17 NOTE — Discharge Instructions (Signed)

## 2016-06-17 ENCOUNTER — Other Ambulatory Visit: Payer: Self-pay | Admitting: Neurosurgery

## 2016-06-17 DIAGNOSIS — M542 Cervicalgia: Secondary | ICD-10-CM

## 2016-07-03 ENCOUNTER — Encounter (HOSPITAL_COMMUNITY): Payer: Self-pay | Admitting: Emergency Medicine

## 2016-07-03 ENCOUNTER — Emergency Department (HOSPITAL_COMMUNITY)
Admission: EM | Admit: 2016-07-03 | Discharge: 2016-07-03 | Disposition: A | Discharge status: Home / Self Care | Payer: Medicaid Other | Attending: Emergency Medicine | Admitting: Emergency Medicine

## 2016-07-03 ENCOUNTER — Emergency Department (HOSPITAL_COMMUNITY): Payer: Medicaid Other

## 2016-07-03 DIAGNOSIS — Z79899 Other long term (current) drug therapy: Secondary | ICD-10-CM | POA: Insufficient documentation

## 2016-07-03 DIAGNOSIS — Z7982 Long term (current) use of aspirin: Secondary | ICD-10-CM | POA: Insufficient documentation

## 2016-07-03 DIAGNOSIS — I10 Essential (primary) hypertension: Secondary | ICD-10-CM | POA: Insufficient documentation

## 2016-07-03 DIAGNOSIS — R109 Unspecified abdominal pain: Secondary | ICD-10-CM | POA: Diagnosis present

## 2016-07-03 DIAGNOSIS — R1032 Left lower quadrant pain: Secondary | ICD-10-CM | POA: Insufficient documentation

## 2016-07-03 LAB — URINALYSIS, ROUTINE W REFLEX MICROSCOPIC
Bilirubin Urine: NEGATIVE
Glucose, UA: NEGATIVE mg/dL
Hgb urine dipstick: NEGATIVE
KETONES UR: NEGATIVE mg/dL
NITRITE: NEGATIVE
PROTEIN: NEGATIVE mg/dL
Specific Gravity, Urine: 1.02 (ref 1.005–1.030)
pH: 7.5 (ref 5.0–8.0)

## 2016-07-03 LAB — URINE MICROSCOPIC-ADD ON

## 2016-07-03 LAB — COMPREHENSIVE METABOLIC PANEL
ALBUMIN: 3.5 g/dL (ref 3.5–5.0)
ALT: 23 U/L (ref 14–54)
AST: 27 U/L (ref 15–41)
Alkaline Phosphatase: 152 U/L — ABNORMAL HIGH (ref 38–126)
Anion gap: 6 (ref 5–15)
BILIRUBIN TOTAL: 1 mg/dL (ref 0.3–1.2)
BUN: 7 mg/dL (ref 6–20)
CALCIUM: 9.8 mg/dL (ref 8.9–10.3)
CO2: 30 mmol/L (ref 22–32)
Chloride: 102 mmol/L (ref 101–111)
Creatinine, Ser: 0.45 mg/dL (ref 0.44–1.00)
Glucose, Bld: 106 mg/dL — ABNORMAL HIGH (ref 65–99)
Potassium: 3.7 mmol/L (ref 3.5–5.1)
SODIUM: 138 mmol/L (ref 135–145)
Total Protein: 7.4 g/dL (ref 6.5–8.1)

## 2016-07-03 LAB — POC URINE PREG, ED: PREG TEST UR: NEGATIVE

## 2016-07-03 LAB — CBC
HCT: 37.5 % (ref 36.0–46.0)
Hemoglobin: 12.1 g/dL (ref 12.0–15.0)
MCH: 27.9 pg (ref 26.0–34.0)
MCHC: 32.3 g/dL (ref 30.0–36.0)
MCV: 86.4 fL (ref 78.0–100.0)
Platelets: 355 10*3/uL (ref 150–400)
RBC: 4.34 MIL/uL (ref 3.87–5.11)
RDW: 13.9 % (ref 11.5–15.5)
WBC: 9 10*3/uL (ref 4.0–10.5)

## 2016-07-03 LAB — WET PREP, GENITAL
Sperm: NONE SEEN
TRICH WET PREP: NONE SEEN
YEAST WET PREP: NONE SEEN

## 2016-07-03 LAB — LIPASE, BLOOD: Lipase: 19 U/L (ref 11–51)

## 2016-07-03 MED ORDER — SODIUM CHLORIDE 0.9 % IV BOLUS (SEPSIS)
1000.0000 mL | Freq: Once | INTRAVENOUS | Status: AC
  Administered 2016-07-03: 1000 mL via INTRAVENOUS

## 2016-07-03 MED ORDER — MORPHINE SULFATE (PF) 4 MG/ML IV SOLN
4.0000 mg | Freq: Once | INTRAVENOUS | Status: AC
  Administered 2016-07-03: 4 mg via INTRAVENOUS
  Filled 2016-07-03: qty 1

## 2016-07-03 MED ORDER — HYDROMORPHONE HCL 1 MG/ML IJ SOLN
0.5000 mg | Freq: Once | INTRAMUSCULAR | Status: AC
Start: 2016-07-03 — End: 2016-07-03
  Administered 2016-07-03: 0.5 mg via INTRAVENOUS
  Filled 2016-07-03: qty 1

## 2016-07-03 MED ORDER — OXYCODONE HCL 5 MG PO TABS: 10.0000 mg | ORAL_TABLET | ORAL | 0 refills | Status: DC | PRN

## 2016-07-03 MED ORDER — ONDANSETRON HCL 4 MG/2ML IJ SOLN
4.0000 mg | Freq: Once | INTRAMUSCULAR | Status: AC
  Administered 2016-07-03: 4 mg via INTRAVENOUS
  Filled 2016-07-03: qty 2

## 2016-07-03 NOTE — Discharge Instructions (Signed)
Please follow with your primary care doctor in the next 2 days for a check-up. They must obtain records for further management.  ° °Do not hesitate to return to the Emergency Department for any new, worsening or concerning symptoms.  ° °Take oxycodone for breakthrough pain, do not drink alcohol, drive, care for children or do other critical tasks while taking oxycodone. ° ° °

## 2016-07-03 NOTE — ED Triage Notes (Signed)
Pt c/o L sided abd pain x 2 days, + nausea. Pt denies urinary s/s

## 2016-07-03 NOTE — ED Notes (Signed)
Pt ambulatory to restroom

## 2016-07-03 NOTE — ED Notes (Signed)
Patient verbalizes understanding of d/c instructions. Patient to lobby via wheelchair. NAD noted.

## 2016-07-03 NOTE — ED Provider Notes (Signed)
[facility]-EMERGENCY DEPT Provider Note   CSN: [id number] Arrival date & time: 07/03/16  0540     History   Chief Complaint Chief Complaint  Patient presents with  . Abdominal Pain    HPI  Blood pressure 134/93, pulse 88, temperature 99 F (37.2 C), temperature source Oral, resp. rate 18, height 5\' 5"  (1.651 m), weight 111.1 kg, SpO2 97 %.  Katie [name] is a 50 y.o. female complaining of left mid abdominal pain onset 2 days ago associated with nausea but no emesis, fever, diarrhea, urea, abnormal vaginal discharge. Pain is 9 out of 10, colicky in nature and not alleviated by oxycodone 10 mg which she takes for chronic pain. She has had kidney stones in the past but states that this does not feel similar however, the pain does radiate to the left flank. She denies chest pain, cough, shortness of breath, trauma. Only prior abdominal surgery was cholecystectomy.  HPI  Past Medical History:  Diagnosis Date  . Anxiety   . Back pain   . Bipolar 1 disorder (HCC)   . Chronic pain entered 08/15/2015   Treated with Oxycodone  . Depression   . Depression   . HTN (hypertension)   . OA (osteoarthritis)   . Obesity   . PE (pulmonary embolism)    oct 2014  . Sciatica     Patient Active Problem List   Diagnosis Date Noted  . Morbid obesity (HCC)   . Bradycardia   . Pulmonary embolism on long-term anticoagulation therapy (HCC) 04/06/2016  . Pain in the chest   . Chronic pain   . Chest wall pain   . Pulmonary embolism (HCC) 07/31/2013  . Vocal cord dysfunction 02/28/2012  . Dyspnea 01/17/2012  . Chest pain 11/25/2011  . Hypokalemia 11/25/2011  . GERD (gastroesophageal reflux disease) 03/08/2011  . DYSMENORRHEA 11/10/2010  . 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 ROTATOR CUFF SYNDROME, RIGHT 04/11/2010  . VITAMIN D DEFICIENCY 02/18/2010  . Bipolar 1 disorder (HCC) 01/17/2010  . Essential hypertension, benign 01/17/2010  . INSOMNIA  01/17/2010  . OBESITY, NOS 01/03/2007  . Depression 01/03/2007  . HEARING LOSS NOS OR DEAFNESS 01/03/2007  . EDEMA-LEGS,DUE TO VENOUS OBSTRUCT. 01/03/2007  . IRRITABLE BOWEL SYNDROME 01/03/2007    Past Surgical History:  Procedure Laterality Date  . CARDIAC CATHETERIZATION  2009   normal; in [place]  . CESAREAN SECTION  2002   x 2, 1997 and 2002  . CHOLECYSTECTOMY  2007  . KNEE SURGERY Right 2005  . TONSILLECTOMY    . TYMPANOSTOMY TUBE PLACEMENT      OB History    No data available       Home Medications    Prior to Admission medications   Medication Sig Start Date End Date Taking? Authorizing Provider  aspirin 81 MG chewable tablet Chew 81 mg by mouth every morning.   Yes Historical Provider, MD  cycloSPORINE (RESTASIS) 0.05 % ophthalmic emulsion Place 1 drop into both eyes daily.   Yes Historical Provider, MD  diazepam (VALIUM) 5 MG tablet Take 5 mg by mouth at bedtime as needed for anxiety.  03/02/16  Yes Historical Provider, MD  fluticasone (FLONASE) 50 MCG/ACT nasal spray instill 1 spray into each nostril twice a day as needed for allergies 01/13/16  Yes Historical Provider, MD  furosemide (LASIX) 40 MG tablet Take 1 tablet (40 mg total) by mouth daily. 08/02/13  Yes Zannie Cove, MD  [facility] ARTHRITIS PAIN  RELIEF EX) Apply 1 each topically daily.   Yes Historical Provider, MD  Menthol-Methyl Salicylate (MUSCLE RUB) 10-15 % CREA Apply 1 application topically daily.   Yes Historical Provider, MD  nitroGLYCERIN (NITROSTAT) 0.4 MG SL tablet Place 1 tablet (0.4 mg total) under the tongue every 5 (five) minutes as needed for chest pain. 04/07/16  Yes Albertine Grates, MD  omeprazole (PRILOSEC) 40 MG capsule Take 40 mg by mouth daily.   Yes Historical Provider, MD  polyvinyl alcohol (LIQUIFILM TEARS) 1.4 % ophthalmic solution Place 1 drop into both eyes daily as needed for dry eyes.   Yes Historical Provider, MD  potassium chloride SA (K-DUR,KLOR-CON) 20 MEQ tablet Take 20  mEq by mouth 2 (two) times daily. 05/03/16  Yes Historical Provider, MD  Rivaroxaban (XARELTO) 20 MG TABS tablet Take 1 tablet (20 mg total) by mouth daily with supper. Starting 10/18, take 20mg  daily 08/23/13  Yes Zannie Cove, MD  oxyCODONE (OXY IR/ROXICODONE) 5 MG immediate release tablet Take 2 tablets (10 mg total) by mouth every 4 (four) hours as needed for severe pain. 07/03/16   Florinda Taflinger, PA-C  phentermine (ADIPEX-P) 37.5 MG tablet Take 37.5 mg by mouth daily before breakfast. Reported on 04/05/2016    Historical Provider, MD  predniSONE (DELTASONE) 20 MG tablet Take 2 tablets (40 mg total) by mouth daily. Take 40 mg by mouth daily for 3 days, then 20mg  by mouth daily for 3 days, then 10mg  daily for 3 days Patient not taking: Reported on 07/03/2016 05/17/16   Antony Madura, PA-C    Family History Family History  Problem Relation Age of Onset  . Heart disease Mother   . Diabetes Father   . Asthma Sister     also had rheumatic fever as a child, died age 40    Social History Social History  Substance Use Topics  . Smoking status: Never Smoker  . Smokeless tobacco: Never Used     Comment: second hand smoke  . Alcohol use 0.0 oz/week     Comment: 2-3 x week has a glass of wine     Allergies   Percocet [oxycodone-acetaminophen]; Tylenol [acetaminophen]; and Vicodin [hydrocodone-acetaminophen]   Review of Systems Review of Systems  10 systems reviewed and found to be negative, except as noted in the HPI.   Physical Exam Updated Vital Signs BP 117/87 (BP Location: Left Arm)   Pulse 62   Temp 99 F (37.2 C) (Oral)   Resp 16   Ht 5\' 5"  (1.651 m)   Wt 111.1 kg   SpO2 99%   BMI 40.77 kg/m   Physical Exam  Constitutional: She is oriented to person, place, and time. She appears well-developed and well-nourished. No distress.  Obese  HENT:  Head: Normocephalic and atraumatic.  Mouth/Throat: Oropharynx is clear and moist.  Eyes: Conjunctivae and EOM are normal.  Pupils are equal, round, and reactive to light.  Neck: Normal range of motion.  Cardiovascular: Normal rate, regular rhythm and intact distal pulses.   Pulmonary/Chest: Effort normal and breath sounds normal.  Abdominal: Soft. Bowel sounds are normal. She exhibits no distension and no mass. There is tenderness. There is guarding. There is no rebound. No hernia.  Exquisite tenderness to palpation in the left lower and left upper quadrant with voluntary guarding and no rebound, negative CVA tenderness to percussion bilaterally.  Genitourinary:  Genitourinary Comments: Pelvic exam has been chaperoned by nurse: No rashes or lesions, thin, profuse, clear, non-foul-smelling vaginal discharge. No cervical motion or  adnexal tenderness.  Musculoskeletal: Normal range of motion.  Neurological: She is alert and oriented to person, place, and time.  Skin: She is not diaphoretic.  Psychiatric: She has a normal mood and affect.  Nursing note and vitals reviewed.    ED Treatments / Results  Labs (all labs ordered are listed, but only abnormal results are displayed) Labs Reviewed  WET PREP, GENITAL - Abnormal; Notable for the following:       Result Value   Clue Cells Wet Prep HPF POC PRESENT (*)    WBC, Wet Prep HPF POC FEW (*)    All other components within normal limits  URINALYSIS, ROUTINE W REFLEX MICROSCOPIC (NOT AT [facility]) - Abnormal; Notable for the following:    Color, Urine AMBER (*)    APPearance CLOUDY (*)    Leukocytes, UA TRACE (*)    All other components within normal limits  COMPREHENSIVE METABOLIC PANEL - Abnormal; Notable for the following:    Glucose, Bld 106 (*)    Alkaline Phosphatase 152 (*)    All other components within normal limits  URINE MICROSCOPIC-ADD ON - Abnormal; Notable for the following:    Squamous Epithelial / LPF 6-30 (*)    Bacteria, UA RARE (*)    All other components within normal limits  LIPASE, BLOOD  CBC  POC URINE PREG, ED  GC/CHLAMYDIA PROBE AMP  ([place]) NOT AT [facility]    EKG  EKG Interpretation None       Radiology US Transvaginal Non-ob  Result Date: 07/03/2016 CLINICAL DATA:  Two days of left lower quadrant pain, irregular periods, known uterine fibroids, history of 2 previous Cesarean sections, onset of last normal menstrual period was February 25, 2016. EXAM: TRANSABDOMINAL AND TRANSVAGINAL ULTRASOUND OF PELVIS DOPPLER ULTRASOUND OF OVARIES TECHNIQUE: Both transabdominal and transvaginal ultrasound examinations of the pelvis were performed. Transabdominal technique was performed for global imaging of the pelvis including uterus, ovaries, adnexal regions, and pelvic cul-de-sac. It was necessary to proceed with endovaginal exam following the transabdominal exam to visualize the uterus, endometrium, ovaries, and adnexal regions. Color and duplex Doppler ultrasound was utilized to evaluate blood flow to the ovaries. COMPARISON:  Abdominal and pelvic CT scan of August 08, 2012. FINDINGS: The study is limited by the patient's body habitus. Uterus Measurements: 9.5 x 6.6 x 7.4 cm. There multiple uterine fibroids. Anteriorly on the left there is a 4.6 x 4 x 4 cm fibroid. On the right in the mid uterine corpus there is a 2.9 x 2.8 x 2.3 cm fibroid. Endometrium Thickness: 5.1 mm.  No focal abnormality visualized. Right ovary Measurements: 2.1 x 1.7 x 1.6 cm. There is a simple appearing cyst measuring 1.4 x 1.1 x 1.2 cm. Left ovary The left ovary could not be visualized. No left adnexal mass was observed. Pulsed Doppler evaluation of the right ovary demonstrates normal low-resistance arterial and venous waveforms. Other findings No abnormal free fluid. IMPRESSION: 1. Uterine fibroids with the largest lying in the left aspect of the fundus measuring 4.6 cm in greatest dimension. Normal endometrium. 2. Simple appearing right ovarian cyst measuring 1.4 cm in diameter. Normal right ovarian vascularity. Nonvisualization of the left ovary. 3. No free pelvic  fluid. Electronically Signed   By: David  [place] M.D.   On: 07/03/2016 08:01   US Pelvis Complete  Result Date: 07/03/2016 CLINICAL DATA:  Two days of left lower quadrant pain, irregular periods, known uterine fibroids, history of 2 previous Cesarean sections, onset of last normal menstrual  period was February 25, 2016. EXAM: TRANSABDOMINAL AND TRANSVAGINAL ULTRASOUND OF PELVIS DOPPLER ULTRASOUND OF OVARIES TECHNIQUE: Both transabdominal and transvaginal ultrasound examinations of the pelvis were performed. Transabdominal technique was performed for global imaging of the pelvis including uterus, ovaries, adnexal regions, and pelvic cul-de-sac. It was necessary to proceed with endovaginal exam following the transabdominal exam to visualize the uterus, endometrium, ovaries, and adnexal regions. Color and duplex Doppler ultrasound was utilized to evaluate blood flow to the ovaries. COMPARISON:  Abdominal and pelvic CT scan of August 08, 2012. FINDINGS: The study is limited by the patient's body habitus. Uterus Measurements: 9.5 x 6.6 x 7.4 cm. There multiple uterine fibroids. Anteriorly on the left there is a 4.6 x 4 x 4 cm fibroid. On the right in the mid uterine corpus there is a 2.9 x 2.8 x 2.3 cm fibroid. Endometrium Thickness: 5.1 mm.  No focal abnormality visualized. Right ovary Measurements: 2.1 x 1.7 x 1.6 cm. There is a simple appearing cyst measuring 1.4 x 1.1 x 1.2 cm. Left ovary The left ovary could not be visualized. No left adnexal mass was observed. Pulsed Doppler evaluation of the right ovary demonstrates normal low-resistance arterial and venous waveforms. Other findings No abnormal free fluid. IMPRESSION: 1. Uterine fibroids with the largest lying in the left aspect of the fundus measuring 4.6 cm in greatest dimension. Normal endometrium. 2. Simple appearing right ovarian cyst measuring 1.4 cm in diameter. Normal right ovarian vascularity. Nonvisualization of the left ovary. 3. No free pelvic fluid.  Electronically Signed   By: David  [place] M.D.   On: 07/03/2016 08:01   [place] Art/ven Flow Abd Pelv Doppler  Result Date: 07/03/2016 CLINICAL DATA:  Two days of left lower quadrant pain, irregular periods, known uterine fibroids, history of 2 previous Cesarean sections, onset of last normal menstrual period was February 25, 2016. EXAM: TRANSABDOMINAL AND TRANSVAGINAL ULTRASOUND OF PELVIS DOPPLER ULTRASOUND OF OVARIES TECHNIQUE: Both transabdominal and transvaginal ultrasound examinations of the pelvis were performed. Transabdominal technique was performed for global imaging of the pelvis including uterus, ovaries, adnexal regions, and pelvic cul-de-sac. It was necessary to proceed with endovaginal exam following the transabdominal exam to visualize the uterus, endometrium, ovaries, and adnexal regions. Color and duplex Doppler ultrasound was utilized to evaluate blood flow to the ovaries. COMPARISON:  Abdominal and pelvic CT scan of August 08, 2012. FINDINGS: The study is limited by the patient's body habitus. Uterus Measurements: 9.5 x 6.6 x 7.4 cm. There multiple uterine fibroids. Anteriorly on the left there is a 4.6 x 4 x 4 cm fibroid. On the right in the mid uterine corpus there is a 2.9 x 2.8 x 2.3 cm fibroid. Endometrium Thickness: 5.1 mm.  No focal abnormality visualized. Right ovary Measurements: 2.1 x 1.7 x 1.6 cm. There is a simple appearing cyst measuring 1.4 x 1.1 x 1.2 cm. Left ovary The left ovary could not be visualized. No left adnexal mass was observed. Pulsed Doppler evaluation of the right ovary demonstrates normal low-resistance arterial and venous waveforms. Other findings No abnormal free fluid. IMPRESSION: 1. Uterine fibroids with the largest lying in the left aspect of the fundus measuring 4.6 cm in greatest dimension. Normal endometrium. 2. Simple appearing right ovarian cyst measuring 1.4 cm in diameter. Normal right ovarian vascularity. Nonvisualization of the left ovary. 3. No free pelvic  fluid. Electronically Signed   By: David  [place] M.D.   On: 07/03/2016 08:01   Ct Renal Stone Study  Result Date: 07/03/2016 CLINICAL DATA:  Left side abdominal pain for 2 days.  Nausea. EXAM: CT ABDOMEN AND PELVIS WITHOUT CONTRAST TECHNIQUE: Multidetector CT imaging of the abdomen and pelvis was performed following the standard protocol without IV contrast. COMPARISON:  None. FINDINGS: Lower chest: Lung bases are clear. No effusions. Heart is normal size. Hepatobiliary: Prior cholecystectomy.  No focal hepatic abnormality. Pancreas: No focal abnormality or ductal dilatation. Spleen: No focal abnormality.  Normal size. Adrenals/Urinary Tract: No adrenal abnormality. No focal renal abnormality. No stones or hydronephrosis. Urinary bladder is unremarkable. Stomach/Bowel: Normal appendix. Stomach, large and small bowel grossly unremarkable. Vascular/Lymphatic: No evidence of aneurysm or adenopathy. Reproductive: Uterus and adnexa unremarkable.  No mass. Other: No free fluid or free air. Musculoskeletal: No acute bony abnormality or focal bone lesion. IMPRESSION: No acute findings in the abdomen or pelvis. No renal or ureteral stones. No hydronephrosis. Electronically Signed   By: Charlett Nose M.D.   On: 07/03/2016 10:59    Procedures Procedures (including critical care time)  Medications Ordered in ED Medications  morphine 4 MG/ML injection 4 mg (4 mg Intravenous Given 07/03/16 0635)  ondansetron (ZOFRAN) injection 4 mg (4 mg Intravenous Given 07/03/16 0636)  sodium chloride 0.9 % bolus 1,000 mL (0 mLs Intravenous Stopped 07/03/16 0745)  HYDROmorphone (DILAUDID) injection 0.5 mg (0.5 mg Intravenous Given 07/03/16 0908)     Initial Impression / Assessment and Plan / ED Course  I have reviewed the triage vital signs and the nursing notes.  Pertinent labs & imaging results that were available during my care of the patient were reviewed by me and considered in my medical decision making (see chart for  details).   Vitals:   07/03/16 0909 07/03/16 1000 07/03/16 1005 07/03/16 1148  BP: 115/70 107/86 107/86 117/87  Pulse: 81 87 82 62  Resp:   20 16  Temp:      TempSrc:      SpO2: 94% 96% 100% 99%  Weight:      Height:        Medications  morphine 4 MG/ML injection 4 mg (4 mg Intravenous Given 07/03/16 0635)  ondansetron (ZOFRAN) injection 4 mg (4 mg Intravenous Given 07/03/16 0636)  sodium chloride 0.9 % bolus 1,000 mL (0 mLs Intravenous Stopped 07/03/16 0745)  HYDROmorphone (DILAUDID) injection 0.5 mg (0.5 mg Intravenous Given 07/03/16 0908)    Katie [name] is 50 y.o. female presenting with Left-sided abdominal pain associated with nausea, tender to palpation on my exam, no signs of systemic infection, abdominal exam is nonsurgical. Pregnancy test negative, urinalysis without signs of infection. No leukocytosis and no significant electrolyte abnormality, lipase negative. Ultrasound shows roughly 5 cm uterine fibroid, right ovarian cyst 1.4 cm, normal  flow, left ovary is not visualized, no free fluid.  9:30 AM: Patient seen and evaluated the bedside, states that pain remains severe, will obtain CT stone protocol.   CT without acute abnormality, patient is given a short prescription for pain medication, states that she is actually out of her chronic pain meds at home. Will follow with primary care for further management.  Evaluation does not show pathology that would require ongoing emergent intervention or inpatient treatment. Pt is hemodynamically stable and mentating appropriately. Discussed findings and plan with patient/guardian, who agrees with care plan. All questions answered. Return precautions discussed and outpatient follow up given.      Final Clinical Impressions(s) / ED Diagnoses   Final diagnoses:  LLQ pain  Abdominal pain    New Prescriptions Discharge Medication List as  of 07/03/2016 11:40 AM       Wynetta EmeryNicole Donnivan Villena, PA-C 07/03/16 1438    Doug SouSam Jacubowitz,  MD 07/03/16 [phone]

## 2016-07-03 NOTE — ED Notes (Signed)
Pt denies any nausea with ice chips

## 2016-07-04 LAB — GC/CHLAMYDIA PROBE AMP (~~LOC~~) NOT AT ARMC
Chlamydia: NEGATIVE
NEISSERIA GONORRHEA: NEGATIVE

## 2016-07-06 ENCOUNTER — Encounter (HOSPITAL_COMMUNITY): Payer: Self-pay

## 2016-07-06 ENCOUNTER — Emergency Department (HOSPITAL_COMMUNITY)
Admission: EM | Admit: 2016-07-06 | Discharge: 2016-07-06 | Disposition: A | Discharge status: Home / Self Care | Payer: Medicaid Other | Attending: Emergency Medicine | Admitting: Emergency Medicine

## 2016-07-06 ENCOUNTER — Ambulatory Visit
Admission: RE | Admit: 2016-07-06 | Discharge: 2016-07-06 | Disposition: A | Discharge status: Home / Self Care | Payer: Medicaid Other | Source: Ambulatory Visit | Attending: Neurosurgery | Admitting: Neurosurgery

## 2016-07-06 DIAGNOSIS — Z7951 Long term (current) use of inhaled steroids: Secondary | ICD-10-CM | POA: Diagnosis not present

## 2016-07-06 DIAGNOSIS — Z7982 Long term (current) use of aspirin: Secondary | ICD-10-CM | POA: Insufficient documentation

## 2016-07-06 DIAGNOSIS — Z79899 Other long term (current) drug therapy: Secondary | ICD-10-CM | POA: Diagnosis not present

## 2016-07-06 DIAGNOSIS — N390 Urinary tract infection, site not specified: Secondary | ICD-10-CM

## 2016-07-06 DIAGNOSIS — I10 Essential (primary) hypertension: Secondary | ICD-10-CM | POA: Diagnosis not present

## 2016-07-06 DIAGNOSIS — M542 Cervicalgia: Secondary | ICD-10-CM

## 2016-07-06 DIAGNOSIS — R1032 Left lower quadrant pain: Secondary | ICD-10-CM | POA: Diagnosis present

## 2016-07-06 LAB — CBC
HCT: 38 % (ref 36.0–46.0)
Hemoglobin: 12.4 g/dL (ref 12.0–15.0)
MCH: 28.2 pg (ref 26.0–34.0)
MCHC: 32.6 g/dL (ref 30.0–36.0)
MCV: 86.4 fL (ref 78.0–100.0)
PLATELETS: 308 10*3/uL (ref 150–400)
RBC: 4.4 MIL/uL (ref 3.87–5.11)
RDW: 13.7 % (ref 11.5–15.5)
WBC: 9.4 10*3/uL (ref 4.0–10.5)

## 2016-07-06 LAB — COMPREHENSIVE METABOLIC PANEL
ALK PHOS: 142 U/L — AB (ref 38–126)
ALT: 15 U/L (ref 14–54)
AST: 23 U/L (ref 15–41)
Albumin: 3.3 g/dL — ABNORMAL LOW (ref 3.5–5.0)
Anion gap: 7 (ref 5–15)
BUN: 7 mg/dL (ref 6–20)
CALCIUM: 9.7 mg/dL (ref 8.9–10.3)
CHLORIDE: 99 mmol/L — AB (ref 101–111)
CO2: 28 mmol/L (ref 22–32)
CREATININE: 0.47 mg/dL (ref 0.44–1.00)
GFR calc Af Amer: >60 mL/min (ref >60)
GFR calc non Af Amer: >60 mL/min (ref >60)
GLUCOSE: 124 mg/dL — AB (ref 65–99)
Potassium: 3.4 mmol/L — ABNORMAL LOW (ref 3.5–5.1)
SODIUM: 134 mmol/L — AB (ref 135–145)
Total Bilirubin: 0.9 mg/dL (ref 0.3–1.2)
Total Protein: 8.2 g/dL — ABNORMAL HIGH (ref 6.5–8.1)

## 2016-07-06 LAB — URINE MICROSCOPIC-ADD ON: RBC / HPF: NONE SEEN RBC/hpf (ref 0–5)

## 2016-07-06 LAB — URINALYSIS, ROUTINE W REFLEX MICROSCOPIC
GLUCOSE, UA: NEGATIVE mg/dL
HGB URINE DIPSTICK: NEGATIVE
KETONES UR: NEGATIVE mg/dL
Nitrite: POSITIVE — AB
PROTEIN: 30 mg/dL — AB
Specific Gravity, Urine: 1.029 (ref 1.005–1.030)
pH: 5.5 (ref 5.0–8.0)

## 2016-07-06 LAB — POC URINE PREG, ED: Preg Test, Ur: NEGATIVE

## 2016-07-06 LAB — LIPASE, BLOOD: LIPASE: 22 U/L (ref 11–51)

## 2016-07-06 MED ORDER — ONDANSETRON HCL 4 MG/2ML IJ SOLN
4.0000 mg | Freq: Once | INTRAMUSCULAR | Status: AC
  Administered 2016-07-06: 4 mg via INTRAVENOUS
  Filled 2016-07-06: qty 2

## 2016-07-06 MED ORDER — MORPHINE SULFATE (PF) 4 MG/ML IV SOLN
4.0000 mg | Freq: Once | INTRAVENOUS | Status: AC
  Administered 2016-07-06: 4 mg via INTRAVENOUS
  Filled 2016-07-06: qty 1

## 2016-07-06 MED ORDER — KETOROLAC TROMETHAMINE 30 MG/ML IJ SOLN
30.0000 mg | Freq: Once | INTRAMUSCULAR | Status: AC
Start: 2016-07-06 — End: 2016-07-06
  Administered 2016-07-06: 30 mg via INTRAVENOUS
  Filled 2016-07-06: qty 1

## 2016-07-06 MED ORDER — SODIUM CHLORIDE 0.9 % IV BOLUS (SEPSIS)
1000.0000 mL | Freq: Once | INTRAVENOUS | Status: AC
  Administered 2016-07-06: 1000 mL via INTRAVENOUS

## 2016-07-06 MED ORDER — DEXTROSE 5 % IV SOLN
1.0000 g | Freq: Once | INTRAVENOUS | Status: AC
  Administered 2016-07-06: 1 g via INTRAVENOUS
  Filled 2016-07-06: qty 10

## 2016-07-06 MED ORDER — CEPHALEXIN 500 MG PO CAPS: 500.0000 mg | ORAL_CAPSULE | Freq: Three times a day (TID) | ORAL | 0 refills | Status: DC

## 2016-07-06 NOTE — ED Provider Notes (Signed)
[facility]-EMERGENCY DEPT Provider Note   CSN: [id number] Arrival date & time: 07/06/16  6440     History   Chief Complaint Chief Complaint  Patient presents with  . Abdominal Pain    HPI Katie [name] is a 50 y.o. female.  HPI   50 year old obese female with history of bipolar, chronic back pain, anxiety presenting today with complaints of abdominal pain. Patient report for the past for 5 days she has had recurrent pain to the left low abdomen. She described pain as a sharp achy sensation, worsening with movement and with associate nausea, occasional nonbloody nonbilious vomit, and having several bouts of nonbloody non-mucousy loose stools.  She reported having decrease in appetite. Patient was seen 3 days ago the ED with the same complaint. At which time she has a transvaginal ultrasounds, as well as an abdominal pelvis CT scan without contrast. CT scan shows no acute finding. Ultrasound shows evidence of a left uterine fibroid measuring 4.3 cm without any other complicating feature. She was sent home with a short course of pain medication. Patient states she was told that she has a uterine fibroid on the right side but her pain is in the left side. She denies any fever, dysuria, hematuria, vaginal bleeding or vaginal discharge. No recent sick contact. She believes she is going through her perimenopausal since she has not had a  Menstrual period for the past several months.  Past Medical History:  Diagnosis Date  . Anxiety   . Back pain   . Bipolar 1 disorder (HCC)   . Chronic pain entered 08/15/2015   Treated with Oxycodone  . Depression   . Depression   . HTN (hypertension)   . OA (osteoarthritis)   . Obesity   . PE (pulmonary embolism)    oct 2014  . Sciatica     Patient Active Problem List   Diagnosis Date Noted  . Morbid obesity (HCC)   . Bradycardia   . Pulmonary embolism on long-term anticoagulation therapy (HCC) 04/06/2016  . Pain in the chest   . Chronic pain   .  Chest wall pain   . Pulmonary embolism (HCC) 07/31/2013  . Vocal cord dysfunction 02/28/2012  . Dyspnea 01/17/2012  . Chest pain 11/25/2011  . Hypokalemia 11/25/2011  . GERD (gastroesophageal reflux disease) 03/08/2011  . DYSMENORRHEA 11/10/2010  . 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 ROTATOR CUFF SYNDROME, RIGHT 04/11/2010  . VITAMIN D DEFICIENCY 02/18/2010  . Bipolar 1 disorder (HCC) 01/17/2010  . Essential hypertension, benign 01/17/2010  . INSOMNIA 01/17/2010  . OBESITY, NOS 01/03/2007  . Depression 01/03/2007  . HEARING LOSS NOS OR DEAFNESS 01/03/2007  . EDEMA-LEGS,DUE TO VENOUS OBSTRUCT. 01/03/2007  . IRRITABLE BOWEL SYNDROME 01/03/2007    Past Surgical History:  Procedure Laterality Date  . CARDIAC CATHETERIZATION  2009   normal; in [place]  . CESAREAN SECTION  2002   x 2, 1997 and 2002  . CHOLECYSTECTOMY  2007  . KNEE SURGERY Right 2005  . TONSILLECTOMY    . TYMPANOSTOMY TUBE PLACEMENT      OB History    No data available       Home Medications    Prior to Admission medications   Medication Sig Start Date End Date Taking? Authorizing Provider  aspirin 81 MG chewable tablet Chew 81 mg by mouth every morning.    Historical Provider, MD  cycloSPORINE (RESTASIS) 0.05 % ophthalmic emulsion Place 1 drop into both eyes  daily.    Historical Provider, MD  diazepam (VALIUM) 5 MG tablet Take 5 mg by mouth at bedtime as needed for anxiety.  03/02/16   Historical Provider, MD  fluticasone (FLONASE) 50 MCG/ACT nasal spray instill 1 spray into each nostril twice a day as needed for allergies 01/13/16   Historical Provider, MD  furosemide (LASIX) 40 MG tablet Take 1 tablet (40 mg total) by mouth daily. 08/02/13   Zannie CovePreetha Joseph, MD  Liniments [facility](SALONPAS ARTHRITIS PAIN RELIEF EX) Apply 1 each topically daily.    Historical Provider, MD  Menthol-Methyl Salicylate (MUSCLE RUB) 10-15 % CREA Apply 1 application topically daily.     Historical Provider, MD  nitroGLYCERIN (NITROSTAT) 0.4 MG SL tablet Place 1 tablet (0.4 mg total) under the tongue every 5 (five) minutes as needed for chest pain. 04/07/16   Albertine GratesFang Xu, MD  omeprazole (PRILOSEC) 40 MG capsule Take 40 mg by mouth daily.    Historical Provider, MD  oxyCODONE (OXY IR/ROXICODONE) 5 MG immediate release tablet Take 2 tablets (10 mg total) by mouth every 4 (four) hours as needed for severe pain. 07/03/16   Nicole Pisciotta, PA-C  phentermine (ADIPEX-P) 37.5 MG tablet Take 37.5 mg by mouth daily before breakfast. Reported on 04/05/2016    Historical Provider, MD  polyvinyl alcohol (LIQUIFILM TEARS) 1.4 % ophthalmic solution Place 1 drop into both eyes daily as needed for dry eyes.    Historical Provider, MD  potassium chloride SA (K-DUR,KLOR-CON) 20 MEQ tablet Take 20 mEq by mouth 2 (two) times daily. 05/03/16   Historical Provider, MD  predniSONE (DELTASONE) 20 MG tablet Take 2 tablets (40 mg total) by mouth daily. Take 40 mg by mouth daily for 3 days, then 20mg  by mouth daily for 3 days, then 10mg  daily for 3 days Patient not taking: Reported on 07/03/2016 05/17/16   Antony MaduraKelly Humes, PA-C  Rivaroxaban (XARELTO) 20 MG TABS tablet Take 1 tablet (20 mg total) by mouth daily with supper. Starting 10/18, take 20mg  daily 08/23/13   Zannie CovePreetha Joseph, MD    Family History Family History  Problem Relation Age of Onset  . Heart disease Mother   . Diabetes Father   . Asthma Sister     also had rheumatic fever as a child, died age 48    Social History Social History  Substance Use Topics  . Smoking status: Never Smoker  . Smokeless tobacco: Never Used     Comment: second hand smoke  . Alcohol use 0.0 oz/week     Comment: 2-3 x week has a glass of wine     Allergies   Percocet [oxycodone-acetaminophen]; Tylenol [acetaminophen]; and Vicodin [hydrocodone-acetaminophen]   Review of Systems Review of Systems  All other systems reviewed and are negative.    Physical  Exam Updated Vital Signs BP 121/81 (BP Location: Right Arm)   Pulse 89   Temp 98 F (36.7 C) (Oral)   Resp 17   Ht 5\' 5"  (1.651 m)   Wt 111.1 kg   SpO2 97%   BMI 40.77 kg/m   Physical Exam  Constitutional: She appears well-developed and well-nourished. No distress.  Obese female laying in bed in mild discomfort but nontoxic  HENT:  Head: Atraumatic.  Eyes: Conjunctivae are normal.  Neck: Neck supple.  Cardiovascular: Normal rate and regular rhythm.   Pulmonary/Chest: Effort normal and breath sounds normal.  Abdominal: Soft. Bowel sounds are normal. She exhibits no distension. There is tenderness (Tenderness to suprapubic and left lower abdomen on palpation  without guarding or rebound tenderness. Negative Murphy sign, no pain at McBurney's point.).  Neurological: She is alert.  Skin: No rash noted.  Psychiatric: She has a normal mood and affect.  Nursing note and vitals reviewed.    ED Treatments / Results  Labs (all labs ordered are listed, but only abnormal results are displayed) Labs Reviewed  COMPREHENSIVE METABOLIC PANEL - Abnormal; Notable for the following:       Result Value   Sodium 134 (*)    Potassium 3.4 (*)    Chloride 99 (*)    Glucose, Bld 124 (*)    Total Protein 8.2 (*)    Albumin 3.3 (*)    Alkaline Phosphatase 142 (*)    All other components within normal limits  URINALYSIS, ROUTINE W REFLEX MICROSCOPIC (NOT AT [facility]) - Abnormal; Notable for the following:    Color, Urine ORANGE (*)    APPearance CLOUDY (*)    Bilirubin Urine SMALL (*)    Protein, ur 30 (*)    Nitrite POSITIVE (*)    Leukocytes, UA SMALL (*)    All other components within normal limits  URINE MICROSCOPIC-ADD ON - Abnormal; Notable for the following:    Squamous Epithelial / LPF 0-5 (*)    Bacteria, UA MANY (*)    Crystals CA OXALATE CRYSTALS (*)    All other components within normal limits  URINE CULTURE  LIPASE, BLOOD  CBC  POC URINE PREG, ED    EKG  EKG  Interpretation None       Radiology No results found.  Procedures Procedures (including critical care time)  Medications Ordered in ED Medications  sodium chloride 0.9 % bolus 1,000 mL (0 mLs Intravenous Stopped 07/06/16 1037)  ondansetron (ZOFRAN) injection 4 mg (4 mg Intravenous Given 07/06/16 0931)  ketorolac (TORADOL) 30 MG/ML injection 30 mg (30 mg Intravenous Given 07/06/16 0931)  morphine 4 MG/ML injection 4 mg (4 mg Intravenous Given 07/06/16 1139)  cefTRIAXone (ROCEPHIN) 1 g in dextrose 5 % 50 mL IVPB (0 g Intravenous Stopped 07/06/16 1304)  sodium chloride 0.9 % bolus 1,000 mL (0 mLs Intravenous Stopped 07/06/16 1349)     Initial Impression / Assessment and Plan / ED Course  I have reviewed the triage vital signs and the nursing notes.  Pertinent labs & imaging results that were available during my care of the patient were reviewed by me and considered in my medical decision making (see chart for details).  Clinical Course    BP 104/61 (BP Location: Left Arm)   Pulse 73   Temp 98 F (36.7 C) (Oral)   Resp 18   Ht 5\' 5"  (1.651 m)   Wt 111.1 kg   SpO2 96%   BMI 40.77 kg/m    Final Clinical Impressions(s) / ED Diagnoses   Final diagnoses:  UTI (lower urinary tract infection)    New Prescriptions New Prescriptions   CEPHALEXIN (KEFLEX) 500 MG CAPSULE    Take 1 capsule (500 mg total) by mouth 3 (three) times daily.   9:20 AM Patient here with pain to her left low abdomen. Recent transvaginal ultrasound performed 3 days ago shows evidence of a left urine fibroid which may account for her pain. She also endorsed decreased appetite, having occasional nausea vomiting and diarrhea. She could also have a GI bug causing her symptoms. I have low suspicion for colitis or diverticulitis. Will obtain labs, and also provide IV fluid and pain medication for symptomatic treatment.  12:22 PM UA  today shows evidence of a urinary tract infection. Labs are otherwise reassuring.  Patient started on Rocephin while in the ER. Her recent CT scan showing no evidence of obstructive stone or other acute abnormality.  I do not think additional advance imaging is indicative at this time.    2:04 PM Urine culture sent. Patient sent home with Keflex. She can then follow-up with PCP for further care. Return precaution discussed.   Fayrene Helper, PA-C 07/06/16 1405    Mancel Bale, MD 07/06/16 [phone]

## 2016-07-06 NOTE — ED Triage Notes (Signed)
Pt presents with c/o abdominal pain that started on Saturday night. Pt reports she was seen her this past Monday for the same but she is still having pain. Pt reports nausea and vomiting as well.

## 2016-07-06 NOTE — ED Notes (Signed)
Reminded patient we were waiting for urine for a UA and Preg test.

## 2016-07-07 LAB — URINE CULTURE

## 2016-09-24 ENCOUNTER — Encounter (HOSPITAL_COMMUNITY): Payer: Self-pay

## 2016-09-24 ENCOUNTER — Emergency Department (HOSPITAL_COMMUNITY): Payer: Medicaid Other

## 2016-09-24 ENCOUNTER — Emergency Department (HOSPITAL_COMMUNITY)
Admission: EM | Admit: 2016-09-24 | Discharge: 2016-09-24 | Disposition: A | Discharge status: Home / Self Care | Payer: Medicaid Other | Attending: Emergency Medicine | Admitting: Emergency Medicine

## 2016-09-24 DIAGNOSIS — Z79899 Other long term (current) drug therapy: Secondary | ICD-10-CM | POA: Insufficient documentation

## 2016-09-24 DIAGNOSIS — Z7982 Long term (current) use of aspirin: Secondary | ICD-10-CM | POA: Diagnosis not present

## 2016-09-24 DIAGNOSIS — Z791 Long term (current) use of non-steroidal anti-inflammatories (NSAID): Secondary | ICD-10-CM | POA: Insufficient documentation

## 2016-09-24 DIAGNOSIS — I10 Essential (primary) hypertension: Secondary | ICD-10-CM | POA: Insufficient documentation

## 2016-09-24 DIAGNOSIS — Z7722 Contact with and (suspected) exposure to environmental tobacco smoke (acute) (chronic): Secondary | ICD-10-CM | POA: Diagnosis not present

## 2016-09-24 DIAGNOSIS — M542 Cervicalgia: Secondary | ICD-10-CM | POA: Diagnosis present

## 2016-09-24 DIAGNOSIS — M5412 Radiculopathy, cervical region: Secondary | ICD-10-CM | POA: Diagnosis not present

## 2016-09-24 MED ORDER — PREDNISONE 10 MG (21) PO TBPK: 10.0000 mg | ORAL_TABLET | Freq: Every day | ORAL | 0 refills | Status: DC

## 2016-09-24 MED ORDER — KETOROLAC TROMETHAMINE 60 MG/2ML IM SOLN
60.0000 mg | Freq: Once | INTRAMUSCULAR | Status: AC
  Administered 2016-09-24: 60 mg via INTRAMUSCULAR
  Filled 2016-09-24: qty 2

## 2016-09-24 MED ORDER — HYDROMORPHONE HCL 1 MG/ML IJ SOLN
1.0000 mg | Freq: Once | INTRAMUSCULAR | Status: AC
  Administered 2016-09-24: 1 mg via INTRAMUSCULAR
  Filled 2016-09-24: qty 1

## 2016-09-24 MED ORDER — PREDNISONE 20 MG PO TABS
60.0000 mg | ORAL_TABLET | Freq: Once | ORAL | Status: AC
  Administered 2016-09-24: 60 mg via ORAL
  Filled 2016-09-24: qty 3

## 2016-09-24 MED ORDER — CYCLOBENZAPRINE HCL 10 MG PO TABS: 10.0000 mg | ORAL_TABLET | Freq: Three times a day (TID) | ORAL | 0 refills | Status: DC | PRN

## 2016-09-24 NOTE — ED Triage Notes (Signed)
Patient reports right neck pain that radiates into the right shoulder, right arm , and right chest area x 3 days. Patient states while riding in the car today, she felt like something popped in her right shoulder and right neck area. Pain is worse with movement. Patient states she took oxycodone 10 mg at 1200 with no relief. Patient states she has been seeing a "spinal specialist".

## 2016-09-24 NOTE — ED Provider Notes (Signed)
[facility]-EMERGENCY DEPT Provider Note   CSN: [id number] Arrival date & time: 09/24/16  1620     History   Chief Complaint Chief Complaint  Patient presents with  . Neck Pain  . Back Pain    HPI Katie [name] is a 50 y.o. female.  HPI 50 yo F with PMHx of 50 year old female with past medical history of chronic pain, bipolar disorder, who presents with neck pain. Patient states that she has recently begun seeing a spinal specialist for known degenerative disease throughout his entire cervical spine. She was in the car several days ago when she turned and felt a "pop sensation in her neck. She then began to experience worsening midline and paraspinal neck pain with sharp, tingling, burning pain radiating down her right arm. No fevers or chills. Denies any direct trauma to the area. She has had no lower extremity weakness, numbness, loss of bowel or bladder function. Her numbness and arm pain feels similar to her previous episodes but are moderately worsened.  Past Medical History:  Diagnosis Date  . Anxiety   . Back pain   . Bipolar 1 disorder (HCC)   . Chronic pain entered 08/15/2015   Treated with Oxycodone  . Depression   . Depression   . HTN (hypertension)   . OA (osteoarthritis)   . Obesity   . PE (pulmonary embolism)    oct 2014  . Sciatica     Patient Active Problem List   Diagnosis Date Noted  . Morbid obesity (HCC)   . Bradycardia   . Pulmonary embolism on long-term anticoagulation therapy (HCC) 04/06/2016  . Pain in the chest   . Chronic pain   . Chest wall pain   . Pulmonary embolism (HCC) 07/31/2013  . Vocal cord dysfunction 02/28/2012  . Dyspnea 01/17/2012  . Chest pain 11/25/2011  . Hypokalemia 11/25/2011  . GERD (gastroesophageal reflux disease) 03/08/2011  . DYSMENORRHEA 11/10/2010  . 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 ROTATOR CUFF SYNDROME, RIGHT 04/11/2010  . VITAMIN D DEFICIENCY 02/18/2010  .  Bipolar 1 disorder (HCC) 01/17/2010  . Essential hypertension, benign 01/17/2010  . INSOMNIA 01/17/2010  . OBESITY, NOS 01/03/2007  . Depression 01/03/2007  . HEARING LOSS NOS OR DEAFNESS 01/03/2007  . EDEMA-LEGS,DUE TO VENOUS OBSTRUCT. 01/03/2007  . IRRITABLE BOWEL SYNDROME 01/03/2007    Past Surgical History:  Procedure Laterality Date  . ADENOIDECTOMY    . CARDIAC CATHETERIZATION  2009   normal; in [place]  . CESAREAN SECTION  2002   x 2, 1997 and 2002  . CHOLECYSTECTOMY  2007  . KNEE SURGERY Right 2005  . TONSILLECTOMY    . TYMPANOSTOMY TUBE PLACEMENT      OB History    No data available       Home Medications    Prior to Admission medications   Medication Sig Start Date End Date Taking? Authorizing Provider  aspirin 81 MG chewable tablet Chew 81 mg by mouth every morning.   Yes Historical Provider, MD  cycloSPORINE (RESTASIS) 0.05 % ophthalmic emulsion Place 1 drop into both eyes daily.   Yes Historical Provider, MD  diazepam (VALIUM) 5 MG tablet Take 5 mg by mouth at bedtime as needed for anxiety.  03/02/16  Yes Historical Provider, MD  fluticasone (FLONASE) 50 MCG/ACT nasal spray instill 1 spray into each nostril twice a day as needed for allergies 01/13/16  Yes Historical Provider, MD  furosemide (LASIX) 40 MG tablet Take 1  tablet (40 mg total) by mouth daily. 08/02/13  Yes Zannie CovePreetha Joseph, MD  ibuprofen (ADVIL,MOTRIN) 200 MG tablet Take 400 mg by mouth every 6 (six) hours as needed.   Yes Historical Provider, MD  Liniments (SALONPAS PAIN RELIEF PATCH EX) Apply 1 patch topically at bedtime as needed (pain. leave on for 8 hours then take off.Marland Kitchen.).   Yes Historical Provider, MD  omeprazole (PRILOSEC) 40 MG capsule Take 40 mg by mouth daily.   Yes Historical Provider, MD  Oxycodone HCl 10 MG TABS Take 10 mg by mouth every 8 (eight) hours as needed (pain).   Yes Historical Provider, MD  phentermine (ADIPEX-P) 37.5 MG tablet Take 37.5 mg by mouth daily before breakfast.  Reported on 04/05/2016   Yes Historical Provider, MD  polyvinyl alcohol (LIQUIFILM TEARS) 1.4 % ophthalmic solution Place 1 drop into both eyes daily as needed for dry eyes.   Yes Historical Provider, MD  potassium chloride SA (K-DUR,KLOR-CON) 20 MEQ tablet Take 20 mEq by mouth 2 (two) times daily. 05/03/16  Yes Historical Provider, MD  Rivaroxaban (XARELTO) 20 MG TABS tablet Take 1 tablet (20 mg total) by mouth daily with supper. Starting 10/18, take 20mg  daily 08/23/13  Yes Zannie CovePreetha Joseph, MD  cephALEXin (KEFLEX) 500 MG capsule Take 1 capsule (500 mg total) by mouth 3 (three) times daily. Patient not taking: Reported on 09/24/2016 07/06/16   Fayrene HelperBowie Tran, PA-C  cyclobenzaprine (FLEXERIL) 10 MG tablet Take 1 tablet (10 mg total) by mouth 3 (three) times daily as needed for muscle spasms. 09/24/16   Shaune Pollackameron Kaleea Penner, MD  Menthol-Methyl Salicylate (MUSCLE RUB) 10-15 % CREA Apply 1 application topically at bedtime as needed for muscle pain.     Historical Provider, MD  nitroGLYCERIN (NITROSTAT) 0.4 MG SL tablet Place 1 tablet (0.4 mg total) under the tongue every 5 (five) minutes as needed for chest pain. Patient not taking: Reported on 07/06/2016 04/07/16   Albertine GratesFang Xu, MD  oxyCODONE (OXY IR/ROXICODONE) 5 MG immediate release tablet Take 2 tablets (10 mg total) by mouth every 4 (four) hours as needed for severe pain. Patient not taking: Reported on 09/24/2016 07/03/16   Joni ReiningNicole Pisciotta, PA-C  predniSONE (STERAPRED UNI-PAK 21 TAB) 10 MG (21) TBPK tablet Take 1 tablet (10 mg total) by mouth daily. Take 6 tabs by mouth daily  for 2 days, then 5 tabs for 2 days, then 4 tabs for 2 days, then 3 tabs for 2 days, 2 tabs for 2 days, then 1 tab by mouth daily for 2 days 09/24/16   Shaune Pollackameron Nehemias Sauceda, MD    Family History Family History  Problem Relation Age of Onset  . Heart disease Mother   . Diabetes Father   . Asthma Sister     also had rheumatic fever as a child, died age 48    Social History Social History    Substance Use Topics  . Smoking status: Never Smoker  . Smokeless tobacco: Never Used     Comment: second hand smoke  . Alcohol use 0.0 oz/week     Comment: 2-3 x week has a glass of wine     Allergies   Percocet [oxycodone-acetaminophen]; Tylenol [acetaminophen]; and Vicodin [hydrocodone-acetaminophen]   Review of Systems Review of Systems  Constitutional: Negative for chills, fatigue and fever.  HENT: Negative for congestion and rhinorrhea.   Eyes: Negative for visual disturbance.  Respiratory: Negative for cough, shortness of breath and wheezing.   Cardiovascular: Negative for chest pain and leg swelling.  Gastrointestinal: Negative  for abdominal pain, diarrhea, nausea and vomiting.  Genitourinary: Negative for dysuria and flank pain.  Musculoskeletal: Positive for arthralgias, neck pain and neck stiffness.  Skin: Negative for rash and wound.  Allergic/Immunologic: Negative for immunocompromised state.  Neurological: Negative for syncope, weakness and headaches.  All other systems reviewed and are negative.    Physical Exam Updated Vital Signs BP 132/80 (BP Location: Right Arm)   Pulse 63   Temp 97.6 F (36.4 C) (Oral)   Resp 14   Ht 5\' 5"  (1.651 m)   Wt 250 lb (113.4 kg)   SpO2 100%   BMI 41.60 kg/m   Physical Exam  Constitutional: She is oriented to person, place, and time. She appears well-developed and well-nourished. No distress.  HENT:  Head: Normocephalic and atraumatic.  Eyes: Conjunctivae are normal.  Neck: Neck supple.  Cardiovascular: Normal rate, regular rhythm and normal heart sounds.  Exam reveals no friction rub.   No murmur heard. Pulmonary/Chest: Effort normal and breath sounds normal. No respiratory distress. She has no wheezes. She has no rales.  Abdominal: She exhibits no distension.  Musculoskeletal: She exhibits no edema.  Moderate right paraspinal tenderness to palpation over lower cervical spine. Positive Spurling's test on the right.  No midline deformity, step-offs.  Neurological: She is alert and oriented to person, place, and time. She exhibits normal muscle tone.  Muscle strength is 5 out of 5 in proximal and distal upper and lower extremities bilaterally. Normal sensation to light touch and distal upper and lower extremities.  Skin: Skin is warm. Capillary refill takes less than 2 seconds.  Psychiatric: She has a normal mood and affect.  Nursing note and vitals reviewed.    ED Treatments / Results  Labs (all labs ordered are listed, but only abnormal results are displayed) Labs Reviewed - No data to display  EKG  EKG Interpretation None       Radiology Dg Cervical Spine Complete  Result Date: 09/24/2016 CLINICAL DATA:  Pain for 3 days. EXAM: CERVICAL SPINE - COMPLETE 4+ VIEW COMPARISON:  None. FINDINGS: The cervical spine is well seen through the mid C7 level. The inferior aspect of C7 is also visualized but partially obscured by soft tissues. The cervical thoracic junction is not visualized. No traumatic malalignment identified. No fractures. The pre odontoid space and prevertebral soft tissues are within normal limits. The neural foramina are unremarkable. The lateral masses of C1 align with C2. The odontoid process is normal. IMPRESSION: No fracture or traumatic malalignment. Poor visualization below the mid C7 level. Electronically Signed   By: Gerome Sam III M.D   On: 09/24/2016 18:22    Procedures Procedures (including critical care time)  Medications Ordered in ED Medications  HYDROmorphone (DILAUDID) injection 1 mg (1 mg Intramuscular Given 09/24/16 1755)  ketorolac (TORADOL) injection 60 mg (60 mg Intramuscular Given 09/24/16 1755)  predniSONE (DELTASONE) tablet 60 mg (60 mg Oral Given 09/24/16 1757)     Initial Impression / Assessment and Plan / ED Course  I have reviewed the triage vital signs and the nursing notes.  Pertinent labs & imaging results that were available during my care  of the patient were reviewed by me and considered in my medical decision making (see chart for details).  Clinical Course     50 year old female with past medical history as above who presents with acute onset of neck and right arm pain after turning her neck in the car. No direct trauma. Her exam and history is consistent  with cervical radiculopathy, which she is known to have based on recent MRIs. Plain films show no acute fracture or subluxation. She has no weakness or evidence of ongoing neurological deficits or cord compression. Will treat with brief course of steroids, muscle relaxants, and outpatient spine follow-up. Patient just received high dose oxy from PCP. Return precautions were given in detail. Soft collar given for comfort.  Final Clinical Impressions(s) / ED Diagnoses   Final diagnoses:  Neck pain  Cervical radiculopathy    New Prescriptions New Prescriptions   CYCLOBENZAPRINE (FLEXERIL) 10 MG TABLET    Take 1 tablet (10 mg total) by mouth 3 (three) times daily as needed for muscle spasms.   PREDNISONE (STERAPRED UNI-PAK 21 TAB) 10 MG (21) TBPK TABLET    Take 1 tablet (10 mg total) by mouth daily. Take 6 tabs by mouth daily  for 2 days, then 5 tabs for 2 days, then 4 tabs for 2 days, then 3 tabs for 2 days, 2 tabs for 2 days, then 1 tab by mouth daily for 2 days     Shaune Pollackameron Delvis Kau, MD 09/24/16 [phone]

## 2016-12-01 ENCOUNTER — Encounter (HOSPITAL_COMMUNITY): Payer: Self-pay

## 2016-12-01 DIAGNOSIS — Z7982 Long term (current) use of aspirin: Secondary | ICD-10-CM | POA: Diagnosis not present

## 2016-12-01 DIAGNOSIS — M5431 Sciatica, right side: Secondary | ICD-10-CM | POA: Diagnosis not present

## 2016-12-01 DIAGNOSIS — I10 Essential (primary) hypertension: Secondary | ICD-10-CM | POA: Insufficient documentation

## 2016-12-01 DIAGNOSIS — R197 Diarrhea, unspecified: Secondary | ICD-10-CM | POA: Insufficient documentation

## 2016-12-01 LAB — CBC
HCT: 38.3 % (ref 36.0–46.0)
Hemoglobin: 12.4 g/dL (ref 12.0–15.0)
MCH: 28.6 pg (ref 26.0–34.0)
MCHC: 32.4 g/dL (ref 30.0–36.0)
MCV: 88.5 fL (ref 78.0–100.0)
PLATELETS: 323 10*3/uL (ref 150–400)
RBC: 4.33 MIL/uL (ref 3.87–5.11)
RDW: 14 % (ref 11.5–15.5)
WBC: 9.7 10*3/uL (ref 4.0–10.5)

## 2016-12-01 NOTE — ED Triage Notes (Signed)
Pt complains of diarrhea and body aches for three days, no vomiting but does complain of nausea

## 2016-12-02 ENCOUNTER — Emergency Department (HOSPITAL_COMMUNITY)
Admission: EM | Admit: 2016-12-02 | Discharge: 2016-12-02 | Disposition: A | Discharge status: Home / Self Care | Payer: Medicaid Other | Attending: Emergency Medicine | Admitting: Emergency Medicine

## 2016-12-02 DIAGNOSIS — M5431 Sciatica, right side: Secondary | ICD-10-CM

## 2016-12-02 DIAGNOSIS — R197 Diarrhea, unspecified: Secondary | ICD-10-CM

## 2016-12-02 LAB — COMPREHENSIVE METABOLIC PANEL
ALBUMIN: 3.9 g/dL (ref 3.5–5.0)
ALK PHOS: 126 U/L (ref 38–126)
ALT: 10 U/L — ABNORMAL LOW (ref 14–54)
ANION GAP: 6 (ref 5–15)
AST: 13 U/L — AB (ref 15–41)
BILIRUBIN TOTAL: 0.5 mg/dL (ref 0.3–1.2)
BUN: 11 mg/dL (ref 6–20)
CALCIUM: 9 mg/dL (ref 8.9–10.3)
CO2: 27 mmol/L (ref 22–32)
Chloride: 104 mmol/L (ref 101–111)
Creatinine, Ser: 0.53 mg/dL (ref 0.44–1.00)
GFR calc Af Amer: >60 mL/min (ref >60)
GLUCOSE: 95 mg/dL (ref 65–99)
Potassium: 3.7 mmol/L (ref 3.5–5.1)
Sodium: 137 mmol/L (ref 135–145)
Total Protein: 8 g/dL (ref 6.5–8.1)

## 2016-12-02 LAB — LIPASE, BLOOD: Lipase: 32 U/L (ref 11–51)

## 2016-12-02 MED ORDER — SODIUM CHLORIDE 0.9 % IV BOLUS (SEPSIS)
2000.0000 mL | Freq: Once | INTRAVENOUS | Status: AC
  Administered 2016-12-02: 2000 mL via INTRAVENOUS

## 2016-12-02 NOTE — ED Provider Notes (Signed)
[facility] DEPT Provider Note: Katie Dell, MD, FACEP  CSN: [id number] MRN: [id number] ARRIVAL: 12/01/16 at 2111 ROOM: WA23/WA23  By signing my name below, I, Elder Negus, attest that this documentation has been prepared under the direction and in the presence of Paula Libra, MD. Electronically Signed: Elder Negus, Scribe. 12/02/16. 3:12 AM.   CHIEF COMPLAINT  Diarrhea   HISTORY OF PRESENT ILLNESS  Katie [name] is a 51 y.o. female with history of obesity and hypertension presents to the ED for evaluation of diarrhea. This patient states of the last 3 days she has experienced frequent watery diarrhea with associated lower abdominal cramping. She denies any nausea, vomiting or fevers. She is drinking fluids at home. She attempted Pepto-bismol at home without improvement. She also presents stating that "her sciatica is acting up" along the R side. She believes this is her typical presentation. Also reporting dry cough with sinus congestion. She has no other complaints at this time.   Consultation with the [facility] state controlled substances database reveals the patient has received 13 prescriptions for oxycodone in the past year mostly from her primary care physician Dr. Mikeal Hawthorne. She also has been receiving monthly diazepam prescriptions as well.    Past Medical History:  Diagnosis Date  . Anxiety   . Back pain   . Bipolar 1 disorder (HCC)   . Chronic pain entered 08/15/2015   Treated with Oxycodone  . Depression   . Depression   . HTN (hypertension)   . OA (osteoarthritis)   . Obesity   . PE (pulmonary embolism)    oct 2014  . Sciatica     Past Surgical History:  Procedure Laterality Date  . ADENOIDECTOMY    . CARDIAC CATHETERIZATION  2009   normal; in [place]  . CESAREAN SECTION  2002   x 2, 1997 and 2002  . CHOLECYSTECTOMY  2007  . KNEE SURGERY Right 2005  . TONSILLECTOMY    . TYMPANOSTOMY TUBE PLACEMENT      Family History  Problem  Relation Age of Onset  . Heart disease Mother   . Diabetes Father   . Asthma Sister     also had rheumatic fever as a child, died age 50    Social History  Substance Use Topics  . Smoking status: Never Smoker  . Smokeless tobacco: Never Used     Comment: second hand smoke  . Alcohol use 0.0 oz/week     Comment: 2-3 x week has a glass of wine    Prior to Admission medications   Medication Sig Start Date End Date Taking? Authorizing Provider  aspirin 81 MG chewable tablet Chew 81 mg by mouth every morning.    Historical Provider, MD  cephALEXin (KEFLEX) 500 MG capsule Take 1 capsule (500 mg total) by mouth 3 (three) times daily. Patient not taking: Reported on 09/24/2016 07/06/16   Fayrene Helper, PA-C  cyclobenzaprine (FLEXERIL) 10 MG tablet Take 1 tablet (10 mg total) by mouth 3 (three) times daily as needed for muscle spasms. 09/24/16   Shaune Pollack, MD  cycloSPORINE (RESTASIS) 0.05 % ophthalmic emulsion Place 1 drop into both eyes daily.    Historical Provider, MD  diazepam (VALIUM) 5 MG tablet Take 5 mg by mouth at bedtime as needed for anxiety.  03/02/16   Historical Provider, MD  fluticasone Aleda Grana) 50 MCG/ACT nasal spray instill 1 spray into each nostril twice a day as needed for allergies 01/13/16   Historical Provider, MD  furosemide (  LASIX) 40 MG tablet Take 1 tablet (40 mg total) by mouth daily. 08/02/13   Zannie CovePreetha Joseph, MD  ibuprofen (ADVIL,MOTRIN) 200 MG tablet Take 400 mg by mouth every 6 (six) hours as needed.    Historical Provider, MD  Liniments (SALONPAS PAIN RELIEF PATCH EX) Apply 1 patch topically at bedtime as needed (pain. leave on for 8 hours then take off.Marland Kitchen.).    Historical Provider, MD  Menthol-Methyl Salicylate (MUSCLE RUB) 10-15 % CREA Apply 1 application topically at bedtime as needed for muscle pain.     Historical Provider, MD  nitroGLYCERIN (NITROSTAT) 0.4 MG SL tablet Place 1 tablet (0.4 mg total) under the tongue every 5 (five) minutes as needed for chest  pain. Patient not taking: Reported on 07/06/2016 04/07/16   Albertine GratesFang Xu, MD  omeprazole (PRILOSEC) 40 MG capsule Take 40 mg by mouth daily.    Historical Provider, MD  oxyCODONE (OXY IR/ROXICODONE) 5 MG immediate release tablet Take 2 tablets (10 mg total) by mouth every 4 (four) hours as needed for severe pain. Patient not taking: Reported on 09/24/2016 07/03/16   Joni ReiningNicole Pisciotta, PA-C  Oxycodone HCl 10 MG TABS Take 10 mg by mouth every 8 (eight) hours as needed (pain).    Historical Provider, MD  phentermine (ADIPEX-P) 37.5 MG tablet Take 37.5 mg by mouth daily before breakfast. Reported on 04/05/2016    Historical Provider, MD  polyvinyl alcohol (LIQUIFILM TEARS) 1.4 % ophthalmic solution Place 1 drop into both eyes daily as needed for dry eyes.    Historical Provider, MD  potassium chloride SA (K-DUR,KLOR-CON) 20 MEQ tablet Take 20 mEq by mouth 2 (two) times daily. 05/03/16   Historical Provider, MD  predniSONE (STERAPRED UNI-PAK 21 TAB) 10 MG (21) TBPK tablet Take 1 tablet (10 mg total) by mouth daily. Take 6 tabs by mouth daily  for 2 days, then 5 tabs for 2 days, then 4 tabs for 2 days, then 3 tabs for 2 days, 2 tabs for 2 days, then 1 tab by mouth daily for 2 days 09/24/16   Shaune Pollackameron Isaacs, MD  Rivaroxaban (XARELTO) 20 MG TABS tablet Take 1 tablet (20 mg total) by mouth daily with supper. Starting 10/18, take 20mg  daily 08/23/13   Zannie CovePreetha Joseph, MD    Allergies Percocet [oxycodone-acetaminophen]; Tylenol [acetaminophen]; and Vicodin [hydrocodone-acetaminophen]   REVIEW OF SYSTEMS  Negative except as noted here or in the History of Present Illness.   PHYSICAL EXAMINATION  Initial Vital Signs Blood pressure 136/84, pulse 88, temperature 97.9 F (36.6 C), temperature source Oral, resp. rate 17, height 5\' 5"  (1.651 m), weight 265 lb (120.2 kg), SpO2 100 %.  Examination General: Well-developed, well-nourished female in no acute distress; appearance consistent with age of record HENT:  normocephalic; atraumatic Eyes: pupils equal, round and reactive to light; extraocular muscles intact Neck: supple Heart: regular rate and rhythm Lungs: clear to auscultation bilaterally Abdomen: soft; nondistended; nontender; no masses or hepatosplenomegaly; bowel sounds present Extremities: No deformity; full range of motion except right hip; pulses normal Back: Right paralumbar tenderness; ositive straight leg raise on right.  Neurologic: Awake, alert and oriented; motor function intact in all extremities and symmetric; no facial droop Skin: Warm and dry Psychiatric: Normal mood and affect   RESULTS  Summary of this visit's results, reviewed by myself:   EKG Interpretation  Date/Time:    Ventricular Rate:    PR Interval:    QRS Duration:   QT Interval:    QTC Calculation:   R Axis:  Text Interpretation:        Laboratory Studies: Results for orders placed or performed during the hospital encounter of 12/02/16 (from the past 24 hour(s))  Lipase, blood     Status: None   Collection Time: 12/01/16 11:50 PM  Result Value Ref Range   Lipase 32 11 - 51 U/L  Comprehensive metabolic panel     Status: Abnormal   Collection Time: 12/01/16 11:50 PM  Result Value Ref Range   Sodium 137 135 - 145 mmol/L   Potassium 3.7 3.5 - 5.1 mmol/L   Chloride 104 101 - 111 mmol/L   CO2 27 22 - 32 mmol/L   Glucose, Bld 95 65 - 99 mg/dL   BUN 11 6 - 20 mg/dL   Creatinine, Ser 1.61 0.44 - 1.00 mg/dL   Calcium 9.0 8.9 - 09.6 mg/dL   Total Protein 8.0 6.5 - 8.1 g/dL   Albumin 3.9 3.5 - 5.0 g/dL   AST 13 (L) 15 - 41 U/L   ALT 10 (L) 14 - 54 U/L   Alkaline Phosphatase 126 38 - 126 U/L   Total Bilirubin 0.5 0.3 - 1.2 mg/dL   GFR calc non Af Amer >60 >60 mL/min   GFR calc Af Amer >60 >60 mL/min   Anion gap 6 5 - 15  CBC     Status: None   Collection Time: 12/01/16 11:50 PM  Result Value Ref Range   WBC 9.7 4.0 - 10.5 K/uL   RBC 4.33 3.87 - 5.11 MIL/uL   Hemoglobin 12.4 12.0 - 15.0 g/dL    HCT 04.5 40.9 - 81.1 %   MCV 88.5 78.0 - 100.0 fL   MCH 28.6 26.0 - 34.0 pg   MCHC 32.4 30.0 - 36.0 g/dL   RDW 91.4 78.2 - 95.6 %   Platelets 323 150 - 400 K/uL   Imaging Studies: No results found.  ED COURSE  Nursing notes and initial vitals signs, including pulse oximetry, reviewed.  Vitals:   12/02/16 0300 12/02/16 0330 12/02/16 0400 12/02/16 0430  BP: 141/79 125/80 119/80 119/71  Pulse: (!) 57 (!) 57 65 64  Resp:      Temp:      TempSrc:      SpO2: 98% 97% 98% 98%  Weight:      Height:       6:03 AM Patient feeling better after IV fluid bolus. She was advised to take over-the-counter Imodium for her diarrhea. She is already under chronic pain management for her sciatica we will not be prescribing any narcotics at this time.  PROCEDURES    ED DIAGNOSES     ICD-9-CM ICD-10-CM   1. Diarrhea, unspecified type 787.91 R19.7   2. Sciatica of right side 724.3 M54.31      I personally performed the services described in this documentation, which was scribed in my presence. The recorded information has been reviewed and is accurate.    Paula Libra, MD 12/02/16 [phone]

## 2016-12-27 ENCOUNTER — Encounter (HOSPITAL_COMMUNITY): Payer: Self-pay

## 2016-12-27 ENCOUNTER — Emergency Department (HOSPITAL_COMMUNITY): Payer: Medicaid Other

## 2016-12-27 ENCOUNTER — Emergency Department (HOSPITAL_COMMUNITY)
Admission: EM | Admit: 2016-12-27 | Discharge: 2016-12-27 | Disposition: A | Discharge status: Home / Self Care | Payer: Medicaid Other | Attending: Emergency Medicine | Admitting: Emergency Medicine

## 2016-12-27 DIAGNOSIS — Z7901 Long term (current) use of anticoagulants: Secondary | ICD-10-CM | POA: Diagnosis not present

## 2016-12-27 DIAGNOSIS — M25551 Pain in right hip: Secondary | ICD-10-CM | POA: Insufficient documentation

## 2016-12-27 DIAGNOSIS — Z7982 Long term (current) use of aspirin: Secondary | ICD-10-CM | POA: Insufficient documentation

## 2016-12-27 DIAGNOSIS — G8929 Other chronic pain: Secondary | ICD-10-CM | POA: Diagnosis not present

## 2016-12-27 DIAGNOSIS — I10 Essential (primary) hypertension: Secondary | ICD-10-CM | POA: Insufficient documentation

## 2016-12-27 DIAGNOSIS — M25562 Pain in left knee: Secondary | ICD-10-CM | POA: Insufficient documentation

## 2016-12-27 MED ORDER — KETOROLAC TROMETHAMINE 15 MG/ML IJ SOLN
30.0000 mg | Freq: Once | INTRAMUSCULAR | Status: AC
Start: 2016-12-27 — End: 2016-12-27
  Administered 2016-12-27: 30 mg via INTRAMUSCULAR
  Filled 2016-12-27: qty 2

## 2016-12-27 NOTE — Discharge Instructions (Signed)
Please read attached information. If you experience any new or worsening signs or symptoms please return to the emergency room for evaluation. Please follow-up with your primary care provider or specialist as discussed. Please use tylenol or Ibuprofen as needed for discomfort.

## 2016-12-27 NOTE — ED Notes (Signed)
Patient transported to X-ray 

## 2016-12-27 NOTE — ED Triage Notes (Signed)
Pt c/o R hip and L knee pain. She has a hx of arthritis and sciatica. She states that she is going to see ortho tomorrow. She denies any fall or injury causing this pain. States that she is "barely ambulatory and needs a cane." A&Ox4.

## 2016-12-27 NOTE — ED Provider Notes (Signed)
[facility]ER[facility] DEPT Provider Note   CSN: [id number] Arrival date & time: 12/27/16  1313  By signing my name below, I, Majel Homer, attest that this documentation has been prepared under the direction and in the presence of [employer], PA-C . Electronically Signed: Majel Homer, Scribe. 12/27/2016. 1:55 PM.  History   Chief Complaint Chief Complaint  Patient presents with  . Hip Pain    R  . Knee Pain    L   The history is provided by the patient. No language interpreter was used.   HPI Comments:  Katie [name] is a 51 y.o. female with PMHx of sciatica, HTN, and chronic pain, who presents to the Emergency Department complaining of gradually worsening, left knee pain and right lower back that began ~1 year ago and worsened 2 weeks ago. Pt reports associated weakness in her left leg in which she "needs assistance any time she has to lift her leg." She states her pain is exacerbated in the morning with movement and ambulation. She states she is followed by Dr. Farris Has at [facility] who said he would no longer provide cortisone shots as she requires a left knee arthroplasty. She notes she has an appointment with an orthopedic surgeon tomorrow morning for this. Pt reports hx of sciatica that radiates down her right leg for ~1 year and states she recently received an MRI of her back by [facility] that revealed curvature of her neck. She states she is now experiencing new onset, "throbbing," right hip pain that "feels different from her sciatica." She notes her "hip feels like it is going to tear off and separate from her body." She reports she has taken Ibuprofen and Oxycodone for her pain with no relief. Pt denies any recent trauma or injury, fever, joint swelling, and numbness.   Past Medical History:  Diagnosis Date  . Anxiety   . Back pain   . Bipolar 1 disorder (HCC)   . Chronic pain entered 08/15/2015   Treated with Oxycodone  . Depression   . Depression   . HTN  (hypertension)   . OA (osteoarthritis)   . Obesity   . PE (pulmonary embolism)    oct 2014  . Sciatica     Patient Active Problem List   Diagnosis Date Noted  . Morbid obesity (HCC)   . Bradycardia   . Pulmonary embolism on long-term anticoagulation therapy (HCC) 04/06/2016  . Pain in the chest   . Chronic pain   . Chest wall pain   . Pulmonary embolism (HCC) 07/31/2013  . Vocal cord dysfunction 02/28/2012  . Dyspnea 01/17/2012  . Chest pain 11/25/2011  . Hypokalemia 11/25/2011  . GERD (gastroesophageal reflux disease) 03/08/2011  . DYSMENORRHEA 11/10/2010  . 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 ROTATOR CUFF SYNDROME, RIGHT 04/11/2010  . VITAMIN D DEFICIENCY 02/18/2010  . Bipolar 1 disorder (HCC) 01/17/2010  . Essential hypertension, benign 01/17/2010  . INSOMNIA 01/17/2010  . OBESITY, NOS 01/03/2007  . Depression 01/03/2007  . HEARING LOSS NOS OR DEAFNESS 01/03/2007  . EDEMA-LEGS,DUE TO VENOUS OBSTRUCT. 01/03/2007  . IRRITABLE BOWEL SYNDROME 01/03/2007    Past Surgical History:  Procedure Laterality Date  . ADENOIDECTOMY    . CARDIAC CATHETERIZATION  2009   normal; in [place]  . CESAREAN SECTION  2002   x 2, 1997 and 2002  . CHOLECYSTECTOMY  2007  . KNEE SURGERY Right 2005  . TONSILLECTOMY    . TYMPANOSTOMY  TUBE PLACEMENT      OB History    No data available     Home Medications    Prior to Admission medications   Medication Sig Start Date End Date Taking? Authorizing Provider  aspirin 81 MG chewable tablet Chew 81 mg by mouth every morning.    Historical Provider, MD  cyclobenzaprine (FLEXERIL) 10 MG tablet Take 1 tablet (10 mg total) by mouth 3 (three) times daily as needed for muscle spasms. 09/24/16   Shaune Pollackameron Isaacs, MD  cycloSPORINE (RESTASIS) 0.05 % ophthalmic emulsion Place 1 drop into both eyes daily.    Historical Provider, MD  diazepam (VALIUM) 5 MG tablet Take 5 mg by mouth at bedtime as  needed for anxiety.  03/02/16   Historical Provider, MD  fluticasone (FLONASE) 50 MCG/ACT nasal spray instill 1 spray into each nostril twice a day as needed for allergies 01/13/16   Historical Provider, MD  furosemide (LASIX) 40 MG tablet Take 1 tablet (40 mg total) by mouth daily. 08/02/13   Zannie CovePreetha Joseph, MD  ibuprofen (ADVIL,MOTRIN) 200 MG tablet Take 400 mg by mouth every 6 (six) hours as needed.    Historical Provider, MD  Liniments (SALONPAS PAIN RELIEF PATCH EX) Apply 1 patch topically at bedtime as needed (pain. leave on for 8 hours then take off.Marland Kitchen.).    Historical Provider, MD  Menthol-Methyl Salicylate (MUSCLE RUB) 10-15 % CREA Apply 1 application topically at bedtime as needed for muscle pain.     Historical Provider, MD  omeprazole (PRILOSEC) 40 MG capsule Take 40 mg by mouth daily.    Historical Provider, MD  Oxycodone HCl 10 MG TABS Take 10 mg by mouth every 8 (eight) hours as needed (pain).    Historical Provider, MD  phentermine (ADIPEX-P) 37.5 MG tablet Take 37.5 mg by mouth daily before breakfast. Reported on 04/05/2016    Historical Provider, MD  polyvinyl alcohol (LIQUIFILM TEARS) 1.4 % ophthalmic solution Place 1 drop into both eyes daily as needed for dry eyes.    Historical Provider, MD  potassium chloride SA (K-DUR,KLOR-CON) 20 MEQ tablet Take 20 mEq by mouth 2 (two) times daily. 05/03/16   Historical Provider, MD  predniSONE (STERAPRED UNI-PAK 21 TAB) 10 MG (21) TBPK tablet Take 1 tablet (10 mg total) by mouth daily. Take 6 tabs by mouth daily  for 2 days, then 5 tabs for 2 days, then 4 tabs for 2 days, then 3 tabs for 2 days, 2 tabs for 2 days, then 1 tab by mouth daily for 2 days 09/24/16   Shaune Pollackameron Isaacs, MD  Rivaroxaban (XARELTO) 20 MG TABS tablet Take 1 tablet (20 mg total) by mouth daily with supper. Starting 10/18, take 20mg  daily 08/23/13   Zannie CovePreetha Joseph, MD    Family History Family History  Problem Relation Age of Onset  . Heart disease Mother   . Diabetes Father   .  Asthma Sister     also had rheumatic fever as a child, died age 49    Social History Social History  Substance Use Topics  . Smoking status: Never Smoker  . Smokeless tobacco: Never Used     Comment: second hand smoke  . Alcohol use 0.0 oz/week     Comment: 2-3 x week has a glass of wine   Allergies   Percocet [oxycodone-acetaminophen]; Tylenol [acetaminophen]; and Vicodin [hydrocodone-acetaminophen]  Review of Systems Review of Systems  Constitutional: Negative for fever.  Musculoskeletal: Positive for arthralgias and back pain. Negative for joint swelling.  Neurological: Positive for weakness. Negative for numbness.   Physical Exam Updated Vital Signs BP 105/84   Pulse 83   Temp 97.5 F (36.4 C) (Oral)   Resp 20   SpO2 96%   Physical Exam  Constitutional: She is oriented to person, place, and time. She appears well-developed and well-nourished.  Morbidly obese  HENT:  Head: Normocephalic.  Eyes: EOM are normal.  Neck: Normal range of motion.  Pulmonary/Chest: Effort normal.  Abdominal: She exhibits no distension.  Musculoskeletal: She exhibits tenderness. She exhibits no edema.  Left knee: no appreciable swelling, warmth to touch. Unable to range due to pain.  Right hip: TTP of the greater trochanter.  Minor TTP of the lower lumbar right lateral soft tissue.   Neurological: She is alert and oriented to person, place, and time.  Psychiatric: She has a normal mood and affect.  Nursing note and vitals reviewed.  ED Treatments / Results  DIAGNOSTIC STUDIES:  Oxygen Saturation is 96% on RA, normal by my interpretation.    COORDINATION OF CARE:  1:45 PM Discussed treatment plan with pt at bedside and pt agreed to plan.  Labs (all labs ordered are listed, but only abnormal results are displayed) Labs Reviewed - No data to display  EKG  EKG Interpretation None      Radiology Dg Knee Complete 4 Views Left  Result Date: 12/27/2016 CLINICAL DATA:  Left  knee pain EXAM: LEFT KNEE - COMPLETE 4+ VIEW COMPARISON:  None. FINDINGS: No fracture. No subluxation or dislocation. Loss of joint space noted medial compartment. Hypertrophic spurring visible all 3 compartments. No joint effusion. Bones diffusely demineralized. IMPRESSION: Tricompartmental osteoarthritis.  No acute findings. Electronically Signed   By: Kennith Center M.D.   On: 12/27/2016 14:22   Dg Hip Unilat  With Pelvis 2-3 Views Right  Result Date: 12/27/2016 CLINICAL DATA:  Right hip pain EXAM: DG HIP (WITH OR WITHOUT PELVIS) 2-3V RIGHT COMPARISON:  None. FINDINGS: There is no evidence of hip fracture or dislocation. There is no evidence of arthropathy or other focal bone abnormality. IMPRESSION: Negative. Electronically Signed   By: Marlan Palau M.D.   On: 12/27/2016 14:38    Procedures Procedures (including critical care time)  Medications Ordered in ED Medications  ketorolac (TORADOL) 15 MG/ML injection 30 mg (30 mg Intramuscular Given 12/27/16 1414)    Initial Impression / Assessment and Plan / ED Course  I have reviewed the triage vital signs and the nursing notes.  Pertinent labs & imaging results that were available during my care of the patient were reviewed by me and considered in my medical decision making (see chart for details).  Labs:   Imaging: DG Left Knee and DG Hip w/ Pelvis   Consults:   Therapeutics:   Discharge Meds:  Assessment/Plan:   51 year old female presents with chronic pain.  She has chronic pain to her hip and left knee.  X-ray showed arthritis of the left knee, no significant findings of the right hip.  I suspect this is related to her gait pattern.  Patient has no neurological deficits, no signs of infectious etiology.  She will be discharged with follow-up with orthopedics.  She is given strict return precautions, she verbalized understanding and agreement to this plan and no further questions or concerns at the time of discharge  I personally  performed the services described in this documentation, which was scribed in my presence. The recorded information has been reviewed and is accurate.  Final Clinical Impressions(s) /  ED Diagnoses   Final diagnoses:  Chronic pain of left knee  Chronic pain of right hip    New Prescriptions Discharge Medication List as of 12/27/2016  2:49 PM       Eyvonne Mechanic, PA-C 12/27/16 1520    Pricilla Loveless, MD 12/29/16 [phone]

## 2017-09-08 ENCOUNTER — Emergency Department (HOSPITAL_COMMUNITY)
Admission: EM | Admit: 2017-09-08 | Discharge: 2017-09-08 | Disposition: A | Discharge status: Home / Self Care | Payer: Medicaid Other | Attending: Emergency Medicine | Admitting: Emergency Medicine

## 2017-09-08 ENCOUNTER — Emergency Department (HOSPITAL_COMMUNITY): Payer: Medicaid Other

## 2017-09-08 ENCOUNTER — Encounter (HOSPITAL_COMMUNITY): Payer: Self-pay | Admitting: Emergency Medicine

## 2017-09-08 DIAGNOSIS — G8929 Other chronic pain: Secondary | ICD-10-CM | POA: Diagnosis not present

## 2017-09-08 DIAGNOSIS — Z79899 Other long term (current) drug therapy: Secondary | ICD-10-CM | POA: Insufficient documentation

## 2017-09-08 DIAGNOSIS — Z7982 Long term (current) use of aspirin: Secondary | ICD-10-CM | POA: Insufficient documentation

## 2017-09-08 DIAGNOSIS — Z7901 Long term (current) use of anticoagulants: Secondary | ICD-10-CM | POA: Diagnosis not present

## 2017-09-08 DIAGNOSIS — I10 Essential (primary) hypertension: Secondary | ICD-10-CM | POA: Diagnosis not present

## 2017-09-08 DIAGNOSIS — M25562 Pain in left knee: Secondary | ICD-10-CM | POA: Insufficient documentation

## 2017-09-08 MED ORDER — OXYCODONE HCL 5 MG PO TABS
5.0000 mg | ORAL_TABLET | Freq: Once | ORAL | Status: AC
  Administered 2017-09-08: 5 mg via ORAL
  Filled 2017-09-08: qty 1

## 2017-09-08 NOTE — Discharge Instructions (Signed)
Follow up with Dr. Eulah PontMurphy as planned.

## 2017-09-08 NOTE — ED Triage Notes (Signed)
Patient here with complaints of left knee pain. Reports that she needs a knee replace. Also reports right side sciatica. Pain radiating down right hip.

## 2017-09-08 NOTE — ED Provider Notes (Signed)
[facility]-EMERGENCY DEPT Provider Note   CSN: [id number] Arrival date & time: 09/08/17  1736     History   Chief Complaint Chief Complaint  Patient presents with  . Knee Pain    HPI Katie [name] is a 51 y.o. female who presents to the ED with knee pain. The pain is in the left knee. The pain has been chronic. Patient is followed by Dr. Eulah PontMurphy and is also attending a pain management clinic. Patient reports being out of her pain medication x 8 days and that today the pain was severe. She is here for medication to help with the pain until see can see her provider. Patient has a knee immobilizer at home but reports that it does not fit well and she feels better without it but would like something different for support. Patient is planning to have surgery on her knee but Dr. Eulah PontMurphy wants her to lose weight first. Patient has lost over 25 pounds and is doing water aerobics but has a way to go.   HPI  Past Medical History:  Diagnosis Date  . Anxiety   . Back pain   . Bipolar 1 disorder (HCC)   . Chronic pain entered 08/15/2015   Treated with Oxycodone  . Depression   . Depression   . HTN (hypertension)   . OA (osteoarthritis)   . Obesity   . PE (pulmonary embolism)    oct 2014  . Sciatica     Patient Active Problem List   Diagnosis Date Noted  . Morbid obesity (HCC)   . Bradycardia   . Pulmonary embolism on long-term anticoagulation therapy (HCC) 04/06/2016  . Pain in the chest   . Chronic pain   . Chest wall pain   . Pulmonary embolism (HCC) 07/31/2013  . Vocal cord dysfunction 02/28/2012  . Dyspnea 01/17/2012  . Chest pain 11/25/2011  . Hypokalemia 11/25/2011  . GERD (gastroesophageal reflux disease) 03/08/2011  . DYSMENORRHEA 11/10/2010  . 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 ROTATOR CUFF SYNDROME, RIGHT 04/11/2010  . VITAMIN D DEFICIENCY 02/18/2010  . Bipolar 1 disorder (HCC) 01/17/2010  .  Essential hypertension, benign 01/17/2010  . INSOMNIA 01/17/2010  . OBESITY, NOS 01/03/2007  . Depression 01/03/2007  . HEARING LOSS NOS OR DEAFNESS 01/03/2007  . EDEMA-LEGS,DUE TO VENOUS OBSTRUCT. 01/03/2007  . IRRITABLE BOWEL SYNDROME 01/03/2007    Past Surgical History:  Procedure Laterality Date  . ADENOIDECTOMY    . CARDIAC [place]  2009   [place]  . CESAREAN SECTION  2002   x 2, 1997 and 2002  . CHOLECYSTECTOMY  2007  . KNEE SURGERY Right 2005  . TONSILLECTOMY    . TYMPANOSTOMY TUBE PLACEMENT      OB History    No data available       Home Medications    Prior to Admission medications   Medication Sig Start Date End Date Taking? Authorizing Provider  aspirin 81 MG chewable tablet Chew 81 mg by mouth every morning.    [provider]  cyclobenzaprine (FLEXERIL) 10 MG tablet Take 1 tablet (10 mg total) by mouth 3 (three) times daily as needed for muscle spasms. 09/24/16   Shaune PollackIsaacs, Cameron, MD  cycloSPORINE (RESTASIS) 0.05 % ophthalmic emulsion Place 1 drop into both eyes daily.    [provider]  diazepam (VALIUM) 5 MG tablet Take 5 mg by mouth at bedtime as needed for anxiety.  03/02/16  [provider]  fluticasone (FLONASE) 50 MCG/ACT nasal spray instill 1 spray into each nostril twice a day as needed for allergies 01/13/16   [provider]  furosemide (LASIX) 40 MG tablet Take 1 tablet (40 mg total) by mouth daily. 08/02/13   Zannie Cove, MD  ibuprofen (ADVIL,MOTRIN) 200 MG tablet Take 400 mg by mouth every 6 (six) hours as needed.    [provider]  Liniments (SALONPAS PAIN RELIEF PATCH EX) Apply 1 patch topically at bedtime as needed (pain. leave on for 8 hours then take off.Marland Kitchen).    [provider]  Menthol-Methyl Salicylate (MUSCLE RUB) 10-15 % CREA Apply 1 application topically at bedtime as needed for muscle pain.     [provider]  omeprazole (PRILOSEC) 40 MG capsule Take  40 mg by mouth daily.    [provider]  Oxycodone HCl 10 MG TABS Take 10 mg by mouth every 8 (eight) hours as needed (pain).    [provider]  phentermine (ADIPEX-P) 37.5 MG tablet Take 37.5 mg by mouth daily before breakfast. Reported on 04/05/2016    [provider]  polyvinyl alcohol (LIQUIFILM TEARS) 1.4 % ophthalmic solution Place 1 drop into both eyes daily as needed for dry eyes.    [provider]  potassium chloride SA (K-DUR,KLOR-CON) 20 MEQ tablet Take 20 mEq by mouth 2 (two) times daily. 05/03/16   [provider]  predniSONE (STERAPRED UNI-PAK 21 TAB) 10 MG (21) TBPK tablet Take 1 tablet (10 mg total) by mouth daily. Take 6 tabs by mouth daily  for 2 days, then 5 tabs for 2 days, then 4 tabs for 2 days, then 3 tabs for 2 days, 2 tabs for 2 days, then 1 tab by mouth daily for 2 days 09/24/16   Shaune Pollack, MD  Rivaroxaban (XARELTO) 20 MG TABS tablet Take 1 tablet (20 mg total) by mouth daily with supper. Starting 10/18, take 20mg  daily 08/23/13   Zannie Cove, MD    Family History Family History  Problem Relation Age of Onset  . Heart disease Mother   . Diabetes Father   . Asthma Sister        also had rheumatic fever as a child, died age 36    Social History Social History  Substance Use Topics  . Smoking status: Never Smoker  . Smokeless tobacco: Never Used     Comment: second hand smoke  . Alcohol use 0.0 oz/week     Comment: 2-3 x week has a glass of wine     Allergies   Percocet [oxycodone-acetaminophen]; Tylenol [acetaminophen]; and Vicodin [hydrocodone-acetaminophen]   Review of Systems Review of Systems  Constitutional: Negative for fever.  HENT: Negative.   Respiratory: Negative for shortness of breath.   Cardiovascular: Negative for chest pain.  Musculoskeletal: Positive for arthralgias and joint swelling. Gait problem: due to pain.       Left knee  Skin: Negative for wound.  Psychiatric/Behavioral:  Negative for confusion.     Physical Exam Updated Vital Signs BP (!) 130/99 (BP Location: Left Arm)   Pulse 60   Temp 98.2 F (36.8 C) (Oral)   Resp 14   SpO2 99%   Physical Exam  Constitutional: No distress.  Morbidly obese  HENT:  Head: Normocephalic and atraumatic.  Eyes: EOM are normal.  Neck: Neck supple.  Cardiovascular: Normal rate and intact distal pulses.   Pulmonary/Chest: Effort normal.  Musculoskeletal:       Left knee:  She exhibits swelling. She exhibits no deformity, no laceration and normal alignment. Tenderness found.       Legs: Pain to the anterior aspect of the left knee with range of motion.  Neurological: She is alert.  Skin: Skin is warm and dry.  Psychiatric: She has a normal mood and affect.  Nursing note and vitals reviewed.    ED Treatments / Results  Labs (all labs ordered are listed, but only abnormal results are displayed) Labs Reviewed - No data to display Radiology Dg Knee Complete 4 Views Left  Result Date: 09/08/2017 CLINICAL DATA:  Left knee pain x 1 months with no known injury; pain in medial aspect of the knee EXAM: LEFT KNEE - COMPLETE 4+ VIEW COMPARISON:  12/27/2016 FINDINGS: No fracture.  No bone lesion. Medial joint space compartment narrowing. Marginal osteophytes project from all 3 compartments. No convincing joint effusion. Soft tissues are unremarkable. Findings are without change from the prior study. IMPRESSION: 1. No fracture or acute finding. 2. Osteoarthritis stable from the prior exam. Electronically Signed   By: Amie Portland M.D.   On: 09/08/2017 19:13    Procedures Procedures (including critical care time)  Medications Ordered in ED Medications  oxyCODONE (Oxy IR/ROXICODONE) immediate release tablet 5 mg (5 mg Oral Given 09/08/17 1940)     Initial Impression / Assessment and Plan / ED Course  I have reviewed the triage vital signs and the nursing notes. 51 y.o. morbidly obese female with left knee pain stable  for d/c without fracture or dislocation noted on x-ray. Ace wrap, ice, elevation and f/u with ortho. Patient agrees with plan. Patient given one dose of oxycodone while in the ED.   Final Clinical Impressions(s) / ED Diagnoses   Final diagnoses:  Chronic pain of left knee    New Prescriptions Discharge Medication List as of 09/08/2017  7:36 PM       Janne Napoleon, NP 09/08/17 2010    Loren Racer, MD 09/09/17 2232

## 2018-04-17 ENCOUNTER — Encounter (HOSPITAL_COMMUNITY): Payer: Self-pay | Admitting: Emergency Medicine

## 2018-04-17 ENCOUNTER — Other Ambulatory Visit: Payer: Self-pay

## 2018-04-17 DIAGNOSIS — R300 Dysuria: Secondary | ICD-10-CM | POA: Insufficient documentation

## 2018-04-17 DIAGNOSIS — I10 Essential (primary) hypertension: Secondary | ICD-10-CM | POA: Insufficient documentation

## 2018-04-17 DIAGNOSIS — N3001 Acute cystitis with hematuria: Secondary | ICD-10-CM | POA: Diagnosis not present

## 2018-04-17 DIAGNOSIS — Z7901 Long term (current) use of anticoagulants: Secondary | ICD-10-CM | POA: Insufficient documentation

## 2018-04-17 DIAGNOSIS — R350 Frequency of micturition: Secondary | ICD-10-CM | POA: Diagnosis not present

## 2018-04-17 DIAGNOSIS — Z7982 Long term (current) use of aspirin: Secondary | ICD-10-CM | POA: Insufficient documentation

## 2018-04-17 DIAGNOSIS — R109 Unspecified abdominal pain: Secondary | ICD-10-CM | POA: Diagnosis present

## 2018-04-17 DIAGNOSIS — Z79899 Other long term (current) drug therapy: Secondary | ICD-10-CM | POA: Insufficient documentation

## 2018-04-17 NOTE — ED Triage Notes (Signed)
Patient presents with right flank pain that started at 1100 this morning that radiates to her shoulder. Patient also endorses hematuria, frequency and burning with urination which started at 1930 tonight. Patient states she has dark blood noted on her paper when she wipes but states it is not vaginal. Patient states she feels like she needs to use the restroom every 15 minutes.

## 2018-04-18 ENCOUNTER — Emergency Department (HOSPITAL_COMMUNITY): Payer: Medicaid Other

## 2018-04-18 ENCOUNTER — Emergency Department (HOSPITAL_COMMUNITY)
Admission: EM | Admit: 2018-04-18 | Discharge: 2018-04-18 | Disposition: A | Discharge status: Home / Self Care | Payer: Medicaid Other | Attending: Emergency Medicine | Admitting: Emergency Medicine

## 2018-04-18 DIAGNOSIS — R300 Dysuria: Secondary | ICD-10-CM

## 2018-04-18 DIAGNOSIS — R350 Frequency of micturition: Secondary | ICD-10-CM

## 2018-04-18 DIAGNOSIS — N3001 Acute cystitis with hematuria: Secondary | ICD-10-CM

## 2018-04-18 LAB — CBC WITH DIFFERENTIAL/PLATELET
BASOS ABS: 0 10*3/uL (ref 0.0–0.1)
Basophils Relative: 0 %
Eosinophils Absolute: 0.1 10*3/uL (ref 0.0–0.7)
Eosinophils Relative: 1 %
HEMATOCRIT: 42.8 % (ref 36.0–46.0)
Hemoglobin: 14.1 g/dL (ref 12.0–15.0)
LYMPHS ABS: 2.7 10*3/uL (ref 0.7–4.0)
LYMPHS PCT: 21 %
MCH: 30.2 pg (ref 26.0–34.0)
MCHC: 32.9 g/dL (ref 30.0–36.0)
MCV: 91.6 fL (ref 78.0–100.0)
MONO ABS: 0.9 10*3/uL (ref 0.1–1.0)
MONOS PCT: 7 %
NEUTROS ABS: 8.9 10*3/uL — AB (ref 1.7–7.7)
Neutrophils Relative %: 71 %
Platelets: 302 10*3/uL (ref 150–400)
RBC: 4.67 MIL/uL (ref 3.87–5.11)
RDW: 14.4 % (ref 11.5–15.5)
WBC: 12.6 10*3/uL — ABNORMAL HIGH (ref 4.0–10.5)

## 2018-04-18 LAB — BASIC METABOLIC PANEL
ANION GAP: 10 (ref 5–15)
BUN: 7 mg/dL (ref 6–20)
CALCIUM: 8.7 mg/dL — AB (ref 8.9–10.3)
CO2: 28 mmol/L (ref 22–32)
Chloride: 103 mmol/L (ref 101–111)
Creatinine, Ser: 0.7 mg/dL (ref 0.44–1.00)
GFR calc Af Amer: >60 mL/min (ref >60)
GFR calc non Af Amer: >60 mL/min (ref >60)
GLUCOSE: 97 mg/dL (ref 65–99)
Potassium: 2.9 mmol/L — ABNORMAL LOW (ref 3.5–5.1)
Sodium: 141 mmol/L (ref 135–145)

## 2018-04-18 LAB — URINALYSIS, ROUTINE W REFLEX MICROSCOPIC

## 2018-04-18 LAB — URINALYSIS, MICROSCOPIC (REFLEX): RBC / HPF: >50 RBC/hpf (ref 0–5)

## 2018-04-18 MED ORDER — ONDANSETRON HCL 4 MG/2ML IJ SOLN
4.0000 mg | Freq: Once | INTRAMUSCULAR | Status: AC
  Administered 2018-04-18: 4 mg via INTRAVENOUS
  Filled 2018-04-18: qty 2

## 2018-04-18 MED ORDER — SODIUM CHLORIDE 0.9 % IV SOLN
1.0000 g | Freq: Once | INTRAVENOUS | Status: AC
  Administered 2018-04-18: 1 g via INTRAVENOUS
  Filled 2018-04-18 (×2): qty 10

## 2018-04-18 MED ORDER — PHENAZOPYRIDINE HCL 100 MG PO TABS
100.0000 mg | ORAL_TABLET | Freq: Once | ORAL | Status: AC
  Administered 2018-04-18: 100 mg via ORAL
  Filled 2018-04-18: qty 1

## 2018-04-18 MED ORDER — PHENAZOPYRIDINE HCL 200 MG PO TABS: 200.0000 mg | ORAL_TABLET | Freq: Three times a day (TID) | ORAL | 0 refills | Status: DC | PRN

## 2018-04-18 MED ORDER — CEPHALEXIN 500 MG PO CAPS: 500.0000 mg | ORAL_CAPSULE | Freq: Three times a day (TID) | ORAL | 0 refills | Status: DC

## 2018-04-18 MED ORDER — HYDROMORPHONE HCL 1 MG/ML IJ SOLN
1.0000 mg | Freq: Once | INTRAMUSCULAR | Status: AC
  Administered 2018-04-18: 1 mg via INTRAVENOUS
  Filled 2018-04-18: qty 1

## 2018-04-18 NOTE — Discharge Instructions (Addendum)
Take the prescribed medication as directed.  Like we talked about, the pyridium will turn your urine orange/red which is normal. Make sure to drink plenty of water. Follow-up with your primary care doctor. Return to the ED for new or worsening symptoms.

## 2018-04-18 NOTE — ED Provider Notes (Signed)
[facility]-EMERGENCY DEPT Provider Note   CSN: [id number] Arrival date & time: 04/17/18  2314     History   Chief Complaint Chief Complaint  Patient presents with  . Flank Pain  . Dysuria    HPI Katie [name] is a 51 y.o. female.  The history is provided by the patient and medical records.  Flank Pain   Dysuria   Associated symptoms include flank pain.    52 y.o. F with hx of anxiety, back pain, bipolar disorder, depression, hypertension, PE on Xarelto, presenting to the ED with right flank pain.  Reports symptoms actually began earlier this morning around 11 AM but were tolerable at that time.  States throughout the day pain has significantly worsened, she has been having a hard time getting comfortable.  Reports around 7:30 PM tonight she started seeing a lot of blood in her urine and experiencing urinary frequency and dysuria.  She denies any fever or chills.  Does report some nausea and vomiting.  No diarrhea.  No sick contacts.  No history of kidney stones.  Past Medical History:  Diagnosis Date  . Anxiety   . Back pain   . Bipolar 1 disorder (HCC)   . Chronic pain entered 08/15/2015   Treated with Oxycodone  . Depression   . Depression   . HTN (hypertension)   . OA (osteoarthritis)   . Obesity   . PE (pulmonary embolism)    oct 2014  . Sciatica     Patient Active Problem List   Diagnosis Date Noted  . Morbid obesity (HCC)   . Bradycardia   . Pulmonary embolism on long-term anticoagulation therapy (HCC) 04/06/2016  . Pain in the chest   . Chronic pain   . Chest wall pain   . Pulmonary embolism (HCC) 07/31/2013  . Vocal cord dysfunction 02/28/2012  . Dyspnea 01/17/2012  . Chest pain 11/25/2011  . Hypokalemia 11/25/2011  . GERD (gastroesophageal reflux disease) 03/08/2011  . DYSMENORRHEA 11/10/2010  . 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 ROTATOR CUFF SYNDROME, RIGHT 04/11/2010  .  VITAMIN D DEFICIENCY 02/18/2010  . Bipolar 1 disorder (HCC) 01/17/2010  . Essential hypertension, benign 01/17/2010  . INSOMNIA 01/17/2010  . OBESITY, NOS 01/03/2007  . Depression 01/03/2007  . HEARING LOSS NOS OR DEAFNESS 01/03/2007  . EDEMA-LEGS,DUE TO VENOUS OBSTRUCT. 01/03/2007  . IRRITABLE BOWEL SYNDROME 01/03/2007    Past Surgical History:  Procedure Laterality Date  . ADENOIDECTOMY    . BIOPSY STOMACH    . CARDIAC CATHETERIZATION  2009   normal; in [place]  . CESAREAN SECTION  2002   x 2, 1997 and 2002  . CHOLECYSTECTOMY  2007  . KNEE SURGERY Right 2005  . TONSILLECTOMY    . TYMPANOSTOMY TUBE PLACEMENT       OB History   None      Home Medications    Prior to Admission medications   Medication Sig Start Date End Date Taking? Authorizing Provider  aspirin 81 MG chewable tablet Chew 81 mg by mouth every morning.    [provider]  cyclobenzaprine (FLEXERIL) 10 MG tablet Take 1 tablet (10 mg total) by mouth 3 (three) times daily as needed for muscle spasms. 09/24/16   Shaune Pollack, MD  cycloSPORINE (RESTASIS) 0.05 % ophthalmic emulsion Place 1 drop into both eyes daily.    [provider]  diazepam (VALIUM) 5 MG tablet Take 5 mg by mouth at bedtime  as needed for anxiety.  03/02/16   [provider]  fluticasone Aleda Grana) 50 MCG/ACT nasal spray instill 1 spray into each nostril twice a day as needed for allergies 01/13/16   [provider]  furosemide (LASIX) 40 MG tablet Take 1 tablet (40 mg total) by mouth daily. 08/02/13   Zannie Cove, MD  ibuprofen (ADVIL,MOTRIN) 200 MG tablet Take 400 mg by mouth every 6 (six) hours as needed.    [provider]  Liniments (SALONPAS PAIN RELIEF PATCH EX) Apply 1 patch topically at bedtime as needed (pain. leave on for 8 hours then take off.Marland Kitchen).    [provider]  Menthol-Methyl Salicylate (MUSCLE RUB) 10-15 % CREA Apply 1 application topically at bedtime as needed for  muscle pain.     [provider]  omeprazole (PRILOSEC) 40 MG capsule Take 40 mg by mouth daily.    [provider]  Oxycodone HCl 10 MG TABS Take 10 mg by mouth every 8 (eight) hours as needed (pain).    [provider]  phentermine (ADIPEX-P) 37.5 MG tablet Take 37.5 mg by mouth daily before breakfast. Reported on 04/05/2016    [provider]  polyvinyl alcohol (LIQUIFILM TEARS) 1.4 % ophthalmic solution Place 1 drop into both eyes daily as needed for dry eyes.    [provider]  potassium chloride SA (K-DUR,KLOR-CON) 20 MEQ tablet Take 20 mEq by mouth 2 (two) times daily. 05/03/16   [provider]  predniSONE (STERAPRED UNI-PAK 21 TAB) 10 MG (21) TBPK tablet Take 1 tablet (10 mg total) by mouth daily. Take 6 tabs by mouth daily  for 2 days, then 5 tabs for 2 days, then 4 tabs for 2 days, then 3 tabs for 2 days, 2 tabs for 2 days, then 1 tab by mouth daily for 2 days 09/24/16   Shaune Pollack, MD  Rivaroxaban (XARELTO) 20 MG TABS tablet Take 1 tablet (20 mg total) by mouth daily with supper. Starting 10/18, take 20mg  daily 08/23/13   Zannie Cove, MD    Family History Family History  Problem Relation Age of Onset  . Heart disease Mother   . Diabetes Father   . Asthma Sister        also had rheumatic fever as a child, died age 74    Social History Social History   Tobacco Use  . Smoking status: Never Smoker  . Smokeless tobacco: Never Used  . Tobacco comment: second hand smoke  Substance Use Topics  . Alcohol use: Yes    Alcohol/week: 0.0 oz    Comment: 2-3 x week has a glass of wine  . Drug use: No     Allergies   Percocet [oxycodone-acetaminophen]; Tylenol [acetaminophen]; and Vicodin [hydrocodone-acetaminophen]   Review of Systems Review of Systems  Genitourinary: Positive for dysuria and flank pain.  All other systems reviewed and are negative.    Physical Exam Updated Vital Signs BP (!) 141/76 (BP  Location: Left Arm)   Pulse 64   Temp 98.4 F (36.9 C) (Oral)   Resp 20   Ht 5\' 5"  (1.651 m)   Wt 124.7 kg (275 lb)   LMP 12/28/2015 (LMP Unknown) Comment: preg test waiver signed on 12/27/16  SpO2 98%   BMI 45.76 kg/m   Physical Exam  Constitutional: She is oriented to person, place, and time. She appears well-developed and well-nourished.  Appears uncomfortable  HENT:  Head: Normocephalic and atraumatic.  Mouth/Throat: Oropharynx is clear and moist.  Eyes:  Pupils are equal, round, and reactive to light. Conjunctivae and EOM are normal.  Neck: Normal range of motion.  Cardiovascular: Normal rate, regular rhythm and normal heart sounds.  Pulmonary/Chest: Effort normal and breath sounds normal. No stridor. No respiratory distress.  Abdominal: Soft. Bowel sounds are normal. There is tenderness in the suprapubic area. There is CVA tenderness (right).  Musculoskeletal: Normal range of motion.  Neurological: She is alert and oriented to person, place, and time.  Skin: Skin is warm and dry.  Psychiatric: She has a normal mood and affect.  Nursing note and vitals reviewed.    ED Treatments / Results  Labs (all labs ordered are listed, but only abnormal results are displayed) Labs Reviewed  URINALYSIS, ROUTINE W REFLEX MICROSCOPIC - Abnormal; Notable for the following components:      Result Value   Color, Urine RED (*)    APPearance TURBID (*)    Glucose, UA   (*)    Value: TEST NOT REPORTED DUE TO COLOR INTERFERENCE OF URINE PIGMENT   Hgb urine dipstick   (*)    Value: TEST NOT REPORTED DUE TO COLOR INTERFERENCE OF URINE PIGMENT   Bilirubin Urine   (*)    Value: TEST NOT REPORTED DUE TO COLOR INTERFERENCE OF URINE PIGMENT   Ketones, ur   (*)    Value: TEST NOT REPORTED DUE TO COLOR INTERFERENCE OF URINE PIGMENT   Protein, ur   (*)    Value: TEST NOT REPORTED DUE TO COLOR INTERFERENCE OF URINE PIGMENT   Nitrite   (*)    Value: TEST NOT REPORTED DUE TO COLOR INTERFERENCE OF  URINE PIGMENT   Leukocytes, UA   (*)    Value: TEST NOT REPORTED DUE TO COLOR INTERFERENCE OF URINE PIGMENT   All other components within normal limits  CBC WITH DIFFERENTIAL/PLATELET - Abnormal; Notable for the following components:   WBC 12.6 (*)    Neutro Abs 8.9 (*)    All other components within normal limits  BASIC METABOLIC PANEL - Abnormal; Notable for the following components:   Potassium 2.9 (*)    Calcium 8.7 (*)    All other components within normal limits  URINALYSIS, MICROSCOPIC (REFLEX) - Abnormal; Notable for the following components:   Bacteria, UA FEW (*)    All other components within normal limits  URINE CULTURE    EKG None  Radiology Ct Renal Stone Study  Result Date: 04/18/2018 CLINICAL DATA:  Right flank pain since 1100 hours.  Gross hematuria. EXAM: CT ABDOMEN AND PELVIS WITHOUT CONTRAST TECHNIQUE: Multidetector CT imaging of the abdomen and pelvis was performed following the standard protocol without IV contrast. COMPARISON:  07/03/2016 FINDINGS: LOWER CHEST: Lung bases are clear. Included heart size is top normal in size. No pericardial effusion. No pericardial effusion. HEPATOBILIARY: Status post cholecystectomy. The unenhanced liver is unremarkable. No biliary dilatation is identified. PANCREAS: No ductal dilatation, mass or inflammation is identified. No calculus is seen. SPLEEN: Normal. ADRENALS/URINARY TRACT: Kidneys are orthotopic. No nephrolithiasis, hydronephrosis or solid renal masses. The unopacified ureters are normal in course and caliber. Urinary bladder is partially distended and slightly thick-walled in appearance. A cystitis is not excluded. Normal adrenal glands. STOMACH/BOWEL: The stomach, small and large bowel are normal in course and caliber without inflammatory changes. Normal appendix. VASCULAR/LYMPHATIC: Aortoiliac vessels are normal in course and caliber. No lymphadenopathy by CT size criteria. REPRODUCTIVE: Uterus and adnexa are within normal  limits and stable. OTHER: No intraperitoneal free fluid or free air.  Stable pelvic phleboliths. MUSCULOSKELETAL: Nonacute. IMPRESSION: 1. Slightly thick-walled appearance of the urinary bladder which may be secondary to cystitis and/or underdistention. 2. No genitourinary calculi nor obstructive uropathy is seen. 3. Status post cholecystectomy. Electronically Signed   By: Tollie Eth M.D.   On: 04/18/2018 02:39    Procedures Procedures (including critical care time)  Medications Ordered in ED Medications  HYDROmorphone (DILAUDID) injection 1 mg (1 mg Intravenous Given 04/18/18 0230)  ondansetron (ZOFRAN) injection 4 mg (4 mg Intravenous Given 04/18/18 0230)  cefTRIAXone (ROCEPHIN) 1 g in sodium chloride 0.9 % 100 mL IVPB (0 g Intravenous Stopped 04/18/18 0410)  phenazopyridine (PYRIDIUM) tablet 100 mg (100 mg Oral Given 04/18/18 0350)     Initial Impression / Assessment and Plan / ED Course  I have reviewed the triage vital signs and the nursing notes.  Pertinent labs & imaging results that were available during my care of the patient were reviewed by me and considered in my medical decision making (see chart for details).  52 year old female here with right-sided flank pain, dysuria, urinary frequency.  Has been progressively worsening throughout the day.  She is afebrile and nontoxic but does appear uncomfortable.  Has right CVA tenderness as well as suprapubic tenderness.  Urine sample was given and appears grossly bloody.  Patient does take xarelto which may be contributing to this. Concern for stone vs infectious etiology.  Will obtain basic labs, urine culture.  CT renal study ordered.  IV meds given.  Patient feeling much better after medications here.  Labs overall reassuring.  UA only partially analyzed due to discoloration and blood content.  CT renal study without acute stone, does appear to be thickened wall of the bladder concerning for possible infection.  Given that her UA was only  partially analyzed due to blood, will go ahead and treat with IV Rocephin here.  She is also given dose of Pyridium to help with urinary frequency and dysuria.  Will discharge home with Keflex and Pyridium.  She will need close follow-up with her primary care doctor, will call in the morning to schedule.  Discussed plan with patient, she acknowledged understanding and agreed with plan of care.  Return precautions given for new or worsening symptoms.  Final Clinical Impressions(s) / ED Diagnoses   Final diagnoses:  Acute cystitis with hematuria  Dysuria  Urinary frequency    ED Discharge Orders        Ordered    cephALEXin (KEFLEX) 500 MG capsule  3 times daily     04/18/18 0451    phenazopyridine (PYRIDIUM) 200 MG tablet  3 times daily PRN     04/18/18 0451       Garlon Hatchet, PA-C 04/18/18 1610    Dione Booze, MD 04/18/18 [id number]

## 2018-04-19 LAB — URINE CULTURE
Culture: 50000 — AB
Special Requests: NORMAL

## 2018-04-20 ENCOUNTER — Telehealth: Payer: Self-pay

## 2018-04-20 NOTE — Telephone Encounter (Signed)
Post ED Visit - Positive Culture Follow-up  Culture report reviewed by antimicrobial stewardship pharmacist:  []  Enzo BiNathan Batchelder, Pharm.D. []  Celedonio MiyamotoJeremy Frens, Pharm.D., BCPS AQ-ID []  Garvin FilaMike Maccia, Pharm.D., BCPS []  Georgina PillionElizabeth Martin, Pharm.D., BCPS []  Saint JosephMinh Pham, [address] Rainbow BoulevardPharm.D., BCPS, AAHIVP []  Estella HuskMichelle Turner, Pharm.D., BCPS, AAHIVP [x]  Lysle Pearlachel Rumbarger, PharmD, BCPS []  Sherlynn CarbonAustin Lucas, PharmD []  Pollyann SamplesAndy Johnston, PharmD, BCPS  Positive urine culture Treated with Cephlexin, organism sensitive to the same and no further patient follow-up is required at this time.  Jerry CarasCullom, Marion Rosenberry Burnett 04/20/2018, 9:47 AM

## 2018-09-30 ENCOUNTER — Encounter (HOSPITAL_COMMUNITY): Payer: Self-pay | Admitting: Emergency Medicine

## 2018-09-30 ENCOUNTER — Ambulatory Visit (HOSPITAL_COMMUNITY)
Admission: EM | Admit: 2018-09-30 | Discharge: 2018-09-30 | Disposition: A | Discharge status: Home / Self Care | Payer: Medicaid Other | Attending: Internal Medicine | Admitting: Internal Medicine

## 2018-09-30 ENCOUNTER — Other Ambulatory Visit: Payer: Self-pay

## 2018-09-30 ENCOUNTER — Ambulatory Visit (INDEPENDENT_AMBULATORY_CARE_PROVIDER_SITE_OTHER): Payer: Medicaid Other

## 2018-09-30 DIAGNOSIS — W4904XA Ring or other jewelry causing external constriction, initial encounter: Secondary | ICD-10-CM

## 2018-09-30 DIAGNOSIS — S63613A Unspecified sprain of left middle finger, initial encounter: Secondary | ICD-10-CM

## 2018-09-30 DIAGNOSIS — S60449A External constriction of unspecified finger, initial encounter: Secondary | ICD-10-CM

## 2018-09-30 NOTE — Discharge Instructions (Addendum)
Xray of your finger was negative for bony injury/fracture today.  Ring was removed with a ringcutter without incident.  Ibuprofen or tylenol otc as needed for discomfort.  Ice and elevate for 5-10 minutes several times daily to also help with discomfort.  Anticipate gradual improvement in pain/swelling over the next several days.  Wear splint for comfort.  Recheck or followup with your primary care provider in a week or two if not improving as expected.

## 2018-09-30 NOTE — ED Triage Notes (Signed)
Pt states she got her left middle finger caught on something last night and pulled it side ways.  Since then she has had swelling and pain in the lower finger and her ring is stuck on her finger.  She is ok with us cutting it off if we need to.

## 2018-09-30 NOTE — ED Provider Notes (Signed)
[facility]    CSN: [id number] Arrival date & time: 09/30/18  1459     History   Chief Complaint Chief Complaint  Patient presents with  . Finger Injury    left middle    HPI Katie [name] is a 52 y.o. female.   She wrenched her left third finger last evening, was a little painful at the time but since then has had increased swelling, and now her ring is too tight.  She is here to have her finger checked, and would also like help getting the ring off.  No other injury reported.    HPI  Past Medical History:  Diagnosis Date  . Anxiety   . Back pain   . Bipolar 1 disorder (HCC)   . Chronic pain entered 08/15/2015   Treated with Oxycodone  . Depression   . Depression   . HTN (hypertension)   . OA (osteoarthritis)   . Obesity   . PE (pulmonary embolism)    oct 2014  . Sciatica     Patient Active Problem List   Diagnosis Date Noted  . Morbid obesity (HCC)   . Bradycardia   . Pulmonary embolism on long-term anticoagulation therapy (HCC) 04/06/2016  . Pain in the chest   . Chronic pain   . Chest wall pain   . Pulmonary embolism (HCC) 07/31/2013  . Vocal cord dysfunction 02/28/2012  . Dyspnea 01/17/2012  . Chest pain 11/25/2011  . Hypokalemia 11/25/2011  . GERD (gastroesophageal reflux disease) 03/08/2011  . DYSMENORRHEA 11/10/2010  . 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 ROTATOR CUFF SYNDROME, RIGHT 04/11/2010  . VITAMIN D DEFICIENCY 02/18/2010  . Bipolar 1 disorder (HCC) 01/17/2010  . Essential hypertension, benign 01/17/2010  . INSOMNIA 01/17/2010  . OBESITY, NOS 01/03/2007  . Depression 01/03/2007  . HEARING LOSS NOS OR DEAFNESS 01/03/2007  . EDEMA-LEGS,DUE TO VENOUS OBSTRUCT. 01/03/2007  . IRRITABLE BOWEL SYNDROME 01/03/2007    Past Surgical History:  Procedure Laterality Date  . ADENOIDECTOMY    . BIOPSY STOMACH    . CARDIAC CATHETERIZATION  2009   normal[place]  . CESAREAN  SECTION  2002   x 2, 1997 and 2002  . CHOLECYSTECTOMY  2007  . KNEE SURGERY Right 2005  . TONSILLECTOMY    . TYMPANOSTOMY TUBE PLACEMENT       Home Medications    Prior to Admission medications   Medication Sig Start Date End Date Taking? Authorizing Provider  diazepam (VALIUM) 5 MG tablet Take 5 mg by mouth at bedtime as needed for anxiety.  03/02/16  Yes [provider]  furosemide (LASIX) 40 MG tablet Take 1 tablet (40 mg total) by mouth daily. 08/02/13  Yes Zannie CoveJoseph, Preetha, MD  omeprazole (PRILOSEC) 40 MG capsule Take 40 mg by mouth daily.   Yes [provider]  potassium chloride SA (K-DUR,KLOR-CON) 20 MEQ tablet Take 20 mEq by mouth 2 (two) times daily. 05/03/16  Yes [provider]  Rivaroxaban (XARELTO) 20 MG TABS tablet Take 1 tablet (20 mg total) by mouth daily with supper. Starting 10/18, take 20mg  daily 08/23/13  Yes Zannie CoveJoseph, Preetha, MD    Family History Family History  Problem Relation Age of Onset  . Heart disease Mother   . Diabetes Father   . Asthma Sister        also had rheumatic fever as a child, died age 50    Social History Social History  Tobacco Use  . Smoking status: Never Smoker  . Smokeless tobacco: Never Used  . Tobacco comment: second hand smoke  Substance Use Topics  . Alcohol use: Yes    Alcohol/week: 0.0 standard drinks    Comment: 2-3 x week has a glass of wine  . Drug use: No     Allergies   Percocet [oxycodone-acetaminophen]; Tylenol [acetaminophen]; and Vicodin [hydrocodone-acetaminophen]   Review of Systems Review of Systems  All other systems reviewed and are negative.    Physical Exam Triage Vital Signs ED Triage Vitals [09/30/18 1631]  Enc Vitals Group     BP (!) 151/59     Pulse Rate (!) 50     Resp      Temp 98.3 F (36.8 C)     Temp Source Oral     SpO2 100 %     Weight      Height      Pain Score 7     Pain Loc    Updated Vital Signs BP (!) 151/59 (BP Location: Right Arm)    Pulse (!) 50   Temp 98.3 F (36.8 C) (Oral)   LMP 12/28/2015 (LMP Unknown)   SpO2 100%  Physical Exam  Constitutional: She is oriented to person, place, and time. No distress.  HENT:  Head: Atraumatic.  Eyes:  Conjugate gaze observed, no eye redness/discharge  Neck: Neck supple.  Cardiovascular: Normal rate.  Pulmonary/Chest: No respiratory distress.  Abdominal: She exhibits no distension.  Musculoskeletal: Normal range of motion.  Left third finger has proximal swelling, between the MCP and PIP, where the ring is.  The swelling is diffusely tender, no bruising, no broken skin.  Neurological: She is alert and oriented to person, place, and time.  Skin: Skin is warm and dry.  Nursing note and vitals reviewed.    UC Treatments / Results   Radiology Dg Finger Middle Left  Result Date: 09/30/2018 CLINICAL DATA:  Finger injury with pain and swelling to PIP joint EXAM: LEFT MIDDLE FINGER 2+V COMPARISON:  06/16/2011 FINDINGS: There is no evidence of fracture or dislocation. There is no evidence of arthropathy or other focal bone abnormality. Soft tissue swelling at the IP joint. IMPRESSION: No acute osseous abnormality. Electronically Signed   By: Jasmine Pang M.D.   On: 09/30/2018 17:17    Procedures Procedures (including critical care time) Nurse used ring cutter to remove ring without incident, after several maneuvers attempted (ice, elevation, lubricant) without success  Final Clinical Impressions(s) / UC Diagnoses   Final diagnoses:  Sprain of left middle finger, initial encounter  Tight ring on finger     Discharge Instructions     Xray of your finger was negative for bony injury/fracture today.  Ring was removed with a ringcutter without incident.  Ibuprofen or tylenol otc as needed for discomfort.  Ice and elevate for 5-10 minutes several times daily to also help with discomfort.  Anticipate gradual improvement in pain/swelling over the next several days.  Wear splint  for comfort.  Recheck or followup with your primary care provider in a week or two if not improving as expected.      Isa Rankin, MD 10/03/18 [phone]

## 2019-01-15 ENCOUNTER — Encounter (HOSPITAL_COMMUNITY): Payer: Self-pay

## 2019-01-15 ENCOUNTER — Ambulatory Visit (HOSPITAL_COMMUNITY)
Admission: EM | Admit: 2019-01-15 | Discharge: 2019-01-15 | Disposition: A | Discharge status: Home / Self Care | Payer: Medicaid Other | Attending: Family Medicine | Admitting: Family Medicine

## 2019-01-15 DIAGNOSIS — S161XXA Strain of muscle, fascia and tendon at neck level, initial encounter: Secondary | ICD-10-CM | POA: Diagnosis not present

## 2019-01-15 DIAGNOSIS — M5442 Lumbago with sciatica, left side: Secondary | ICD-10-CM

## 2019-01-15 MED ORDER — CYCLOBENZAPRINE HCL 5 MG PO TABS: 5.0000 mg | ORAL_TABLET | Freq: Two times a day (BID) | ORAL | 0 refills | Status: DC | PRN

## 2019-01-15 MED ORDER — PREDNISONE 50 MG PO TABS: 50.0000 mg | ORAL_TABLET | Freq: Every day | ORAL | 0 refills | Status: AC

## 2019-01-15 NOTE — Discharge Instructions (Signed)
Please begin prednisone daily for the next 5 days, please take with food in the morning if you are able May supplement with 500-1,000 mg of Tylenol every 4-6 hours You may use flexeril as needed to help with pain. This is a muscle relaxer and causes sedation- please use only at bedtime or when you will be home and not have to drive/work-begin with 1 tablet may increase to 2 if needed Alternate ice and heat Avoid heavy lifting, but is still good to go about daily activities, avoid complete bedrest as this may worsen and prolong symptoms  Please follow-up if developing weakness, issues with urination or bowel movements, persistent pain, worsening pain
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
## 2019-07-03 ENCOUNTER — Encounter (HOSPITAL_COMMUNITY): Payer: Self-pay | Admitting: Emergency Medicine

## 2019-07-03 ENCOUNTER — Other Ambulatory Visit: Payer: Self-pay

## 2019-07-03 ENCOUNTER — Emergency Department (HOSPITAL_COMMUNITY): Payer: Medicaid Other

## 2019-07-03 ENCOUNTER — Emergency Department (HOSPITAL_COMMUNITY)
Admission: EM | Admit: 2019-07-03 | Discharge: 2019-07-03 | Disposition: A | Discharge status: Home / Self Care | Payer: Medicaid Other | Attending: Emergency Medicine | Admitting: Emergency Medicine

## 2019-07-03 DIAGNOSIS — Z79899 Other long term (current) drug therapy: Secondary | ICD-10-CM | POA: Insufficient documentation

## 2019-07-03 DIAGNOSIS — R0789 Other chest pain: Secondary | ICD-10-CM | POA: Insufficient documentation

## 2019-07-03 DIAGNOSIS — Z7901 Long term (current) use of anticoagulants: Secondary | ICD-10-CM | POA: Insufficient documentation

## 2019-07-03 DIAGNOSIS — I10 Essential (primary) hypertension: Secondary | ICD-10-CM | POA: Insufficient documentation

## 2019-07-03 DIAGNOSIS — R079 Chest pain, unspecified: Secondary | ICD-10-CM

## 2019-07-03 LAB — BASIC METABOLIC PANEL
Anion gap: 10 (ref 5–15)
BUN: 8 mg/dL (ref 6–20)
CO2: 25 mmol/L (ref 22–32)
Calcium: 8.7 mg/dL — ABNORMAL LOW (ref 8.9–10.3)
Chloride: 105 mmol/L (ref 98–111)
Creatinine, Ser: 0.57 mg/dL (ref 0.44–1.00)
GFR calc Af Amer: >60 mL/min (ref >60)
GFR calc non Af Amer: >60 mL/min (ref >60)
Glucose, Bld: 88 mg/dL (ref 70–99)
Potassium: 4 mmol/L (ref 3.5–5.1)
Sodium: 140 mmol/L (ref 135–145)

## 2019-07-03 LAB — CK: Total CK: 45 U/L (ref 38–234)

## 2019-07-03 LAB — CBC
HCT: 42.3 % (ref 36.0–46.0)
Hemoglobin: 14.4 g/dL (ref 12.0–15.0)
MCH: 32.4 pg (ref 26.0–34.0)
MCHC: 34 g/dL (ref 30.0–36.0)
MCV: 95.3 fL (ref 80.0–100.0)
Platelets: 300 10*3/uL (ref 150–400)
RBC: 4.44 MIL/uL (ref 3.87–5.11)
RDW: 13.5 % (ref 11.5–15.5)
WBC: 11.2 10*3/uL — ABNORMAL HIGH (ref 4.0–10.5)
nRBC: 0 % (ref 0.0–0.2)

## 2019-07-03 LAB — I-STAT BETA HCG BLOOD, ED (MC, WL, AP ONLY): I-stat hCG, quantitative: <5 m[IU]/mL (ref <5)

## 2019-07-03 LAB — TROPONIN I (HIGH SENSITIVITY)
Troponin I (High Sensitivity): 7 ng/L (ref <18)
Troponin I (High Sensitivity): 6 ng/L (ref <18)

## 2019-07-03 MED ORDER — MORPHINE SULFATE (PF) 4 MG/ML IV SOLN
4.0000 mg | Freq: Once | INTRAVENOUS | Status: AC
  Administered 2019-07-03: 20:00:00 4 mg via INTRAVENOUS
  Filled 2019-07-03: qty 1

## 2019-07-03 MED ORDER — KETOROLAC TROMETHAMINE 15 MG/ML IJ SOLN
15.0000 mg | Freq: Once | INTRAMUSCULAR | Status: AC
  Administered 2019-07-03: 15 mg via INTRAVENOUS
  Filled 2019-07-03: qty 1

## 2019-07-03 MED ORDER — HYDROMORPHONE HCL 1 MG/ML IJ SOLN
0.5000 mg | Freq: Once | INTRAMUSCULAR | Status: AC
  Administered 2019-07-03: 0.5 mg via INTRAVENOUS
  Filled 2019-07-03: qty 1

## 2019-07-03 MED ORDER — NAPROXEN 500 MG PO TABS: 500.0000 mg | ORAL_TABLET | Freq: Two times a day (BID) | ORAL | 0 refills | Status: DC

## 2019-07-03 MED ORDER — IOHEXOL 350 MG/ML SOLN
100.0000 mL | Freq: Once | INTRAVENOUS | Status: AC | PRN
  Administered 2019-07-03: 100 mL via INTRAVENOUS

## 2019-07-03 NOTE — Discharge Instructions (Signed)
Please call your primary doctor to schedule close follow-up appointment regarding symptoms you are having today, ideally should be seen within the next 2 to 3 days.  If you develop worsening chest pain, difficulty breathing, episodes of passing out or other new concerning symptom please return to the ER for reassessment.  Please take the prescribed anti-inflammatory as directed.

## 2019-07-03 NOTE — ED Provider Notes (Signed)
[facility] EMERGENCY DEPARTMENT Provider Note   CSN: [id number] Arrival date & time: 07/03/19  1454     History   Chief Complaint Chief Complaint  Patient presents with  . Chest Pain    Bilateral chest wall    HPI Katie [name] is a 53 y.o. female.  Presents to the ER with complaints of chest wall pain.  Patient states she woke up this morning with severe chest pain, primarily on the left side, radiates to back and across her chest.  Up to 9 out of 10 in severity, worse with deep breathing, worse with positions, no association with exertion.  States she has not taken any medicine for this today.  Patient has prior history of pulmonary embolism and states that she still takes Xarelto for this.  Has had mild cough lately, nonproductive, no hemoptysis.  No fever.     HPI  Past Medical History:  Diagnosis Date  . Anxiety   . Back pain   . Bipolar 1 disorder ([place])   . Chronic pain entered 08/15/2015   Treated with Oxycodone  . Depression   . Depression   . HTN (hypertension)   . OA (osteoarthritis)   . Obesity   . PE (pulmonary embolism)    oct 2014  . Sciatica     Patient Active Problem List   Diagnosis Date Noted  . Morbid obesity ([place])   . Bradycardia   . Pulmonary embolism on long-term anticoagulation therapy ([place]) 04/06/2016  . Pain in the chest   . Chronic pain   . Chest wall pain   . Pulmonary embolism ([place]) 07/31/2013  . Vocal cord dysfunction 02/28/2012  . Dyspnea 01/17/2012  . Chest pain 11/25/2011  . Hypokalemia 11/25/2011  . GERD (gastroesophageal reflux disease) 03/08/2011  . DYSMENORRHEA 11/10/2010  . 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 ROTATOR CUFF SYNDROME, RIGHT 04/11/2010  . VITAMIN D DEFICIENCY 02/18/2010  . Bipolar 1 disorder ([place]) 01/17/2010  . Essential hypertension, benign 01/17/2010  . INSOMNIA 01/17/2010  . OBESITY, NOS 01/03/2007  . Depression 01/03/2007  . HEARING  LOSS NOS OR DEAFNESS 01/03/2007  . EDEMA-LEGS,DUE TO VENOUS OBSTRUCT. 01/03/2007  . IRRITABLE BOWEL SYNDROME 01/03/2007    Past Surgical History:  Procedure Laterality Date  . ADENOIDECTOMY    . BIOPSY STOMACH    . CARDIAC CATHETERIZATION  2009   normal; in [place]  . CESAREAN SECTION  2002   x 2, 1997 and 2002  . CHOLECYSTECTOMY  2007  . KNEE SURGERY Right 2005  . TONSILLECTOMY    . TYMPANOSTOMY TUBE PLACEMENT       OB History   No obstetric history on file.      Home Medications    Prior to Admission medications   Medication Sig Start Date End Date Taking? Authorizing Provider  cyclobenzaprine (FLEXERIL) 5 MG tablet Take 1-2 tablets (5-10 mg total) by mouth 2 (two) times daily as needed for muscle spasms. 01/15/19  Yes Wieters, Hallie C, PA-C  diazepam (VALIUM) 5 MG tablet Take 5 mg by mouth at bedtime as needed for anxiety.  03/02/16  Yes [provider]  furosemide (LASIX) 40 MG tablet Take 1 tablet (40 mg total) by mouth daily. 08/02/13  Yes Domenic Polite, MD  lidocaine (LIDODERM) 5 % Place 1 patch onto the skin daily. Remove & Discard patch within 12 hours or as directed by MD   Yes [provider]  omeprazole ([place])  40 MG capsule Take 40 mg by mouth daily.   Yes [provider]  potassium chloride SA (K-DUR,KLOR-CON) 20 MEQ tablet Take 20 mEq by mouth 2 (two) times daily. 05/03/16  Yes [provider]  Rivaroxaban (XARELTO) 20 MG TABS tablet Take 1 tablet (20 mg total) by mouth daily with supper. Starting 10/18, take 20mg  daily 08/23/13  Yes Zannie Cove, MD  naproxen (NAPROSYN) 500 MG tablet Take 1 tablet (500 mg total) by mouth 2 (two) times daily. 07/03/19   Milagros Loll, MD    Family History Family History  Problem Relation Age of Onset  . Heart disease Mother   . Diabetes Father   . Asthma Sister        also had rheumatic fever as a child, died age 26    Social History Social History   Tobacco Use  .  Smoking status: Never Smoker  . Smokeless tobacco: Never Used  . Tobacco comment: second hand smoke  Substance Use Topics  . Alcohol use: Yes    Alcohol/week: 0.0 standard drinks    Comment: 2-3 x week has a glass of wine  . Drug use: No     Allergies   Percocet [oxycodone-acetaminophen], Tylenol [acetaminophen], and Vicodin [hydrocodone-acetaminophen]   Review of Systems Review of Systems  Constitutional: Negative for chills and fever.  HENT: Negative for ear pain and sore throat.   Eyes: Negative for pain and visual disturbance.  Respiratory: Negative for cough and shortness of breath.   Cardiovascular: Positive for chest pain. Negative for palpitations.  Gastrointestinal: Negative for abdominal pain and vomiting.  Genitourinary: Negative for dysuria and hematuria.  Musculoskeletal: Negative for arthralgias and back pain.  Skin: Negative for color change and rash.  Neurological: Negative for seizures and syncope.  All other systems reviewed and are negative.    Physical Exam Updated Vital Signs BP (!) 148/82   Pulse (!) 49   Resp (!) 22   Ht 5\' 5"  (1.651 m)   Wt 115.7 kg   LMP 12/28/2015 (Exact Date)   SpO2 94%   BMI 42.43 kg/m   Physical Exam Vitals signs and nursing note reviewed.  Constitutional:      General: She is not in acute distress.    Appearance: She is well-developed.  HENT:     Head: Normocephalic and atraumatic.  Eyes:     Conjunctiva/sclera: Conjunctivae normal.  Neck:     Musculoskeletal: Neck supple.  Cardiovascular:     Rate and Rhythm: Normal rate and regular rhythm.     Heart sounds: No murmur.  Pulmonary:     Effort: Pulmonary effort is normal. No respiratory distress.     Breath sounds: Normal breath sounds.     Comments: Tenderness palpation over left and right anterior chest wall Abdominal:     Palpations: Abdomen is soft.     Tenderness: There is no abdominal tenderness.  Skin:    General: Skin is warm and dry.     Capillary  Refill: Capillary refill takes less than 2 seconds.  Neurological:     General: No focal deficit present.     Mental Status: She is alert and oriented to person, place, and time.      ED Treatments / Results  Labs (all labs ordered are listed, but only abnormal results are displayed) Labs Reviewed  BASIC METABOLIC PANEL - Abnormal; Notable for the following components:      Result Value   Calcium 8.7 (*)  All other components within normal limits  CBC - Abnormal; Notable for the following components:   WBC 11.2 (*)    All other components within normal limits  CK  I-STAT BETA HCG BLOOD, ED ([facility], [facility], [facility] ONLY)  TROPONIN I (HIGH SENSITIVITY)  TROPONIN I (HIGH SENSITIVITY)    EKG EKG Interpretation  Date/Time:  Thursday July 03 2019 15:02:31 EDT Ventricular Rate:  69 PR Interval:  140 QRS Duration: 88 QT Interval:  428 QTC Calculation: 458 R Axis:   -6 Text Interpretation:  Normal sinus rhythm Normal ECG Confirmed by Marianna Fussykstra, Mahlik Lenn ([id number]) on 07/03/2019 4:19:23 PM   Radiology Dg Chest 2 View  Result Date: 07/03/2019 CLINICAL DATA:  52 with a history of chest pain EXAM: CHEST - 2 VIEW COMPARISON:  None. FINDINGS: The heart size and mediastinal contours are within normal limits. Both lungs are clear. The visualized skeletal structures are unremarkable. IMPRESSION: Negative for acute cardiopulmonary disease Electronically Signed   By: Gilmer MorJaime  Wagner D.O.   On: 07/03/2019 15:43   Ct Angio Chest Pe W And/or Wo Contrast  Result Date: 07/03/2019 CLINICAL DATA:  Chest pain, shortness of breath EXAM: CT ANGIOGRAPHY CHEST WITH CONTRAST TECHNIQUE: Multidetector CT imaging of the chest was performed using the standard protocol during bolus administration of intravenous contrast. Multiplanar CT image reconstructions and MIPs were obtained to evaluate the vascular anatomy. CONTRAST:  100mL OMNIPAQUE IOHEXOL 350 MG/ML SOLN COMPARISON:  01/14/2015 FINDINGS: Cardiovascular: Heart is  borderline in size. Aorta is normal caliber. No filling defects in the pulmonary arteries to suggest pulmonary emboli. Mediastinum/Nodes: No mediastinal, hilar, or axillary adenopathy. Soft tissue in the anterior mediastinum felt represent residual thymus. This is stable since prior study. Lungs/Pleura: Lungs are clear. No focal airspace opacities or suspicious nodules. No effusions. Upper Abdomen: Imaging into the upper abdomen shows no acute findings. Musculoskeletal: Chest wall soft tissues are unremarkable. No acute bony abnormality. Review of the MIP images confirms the above findings. IMPRESSION: No evidence of pulmonary embolus. No acute cardiopulmonary disease Electronically Signed   By: Charlett NoseKevin  Dover M.D.   On: 07/03/2019 20:50    Procedures Procedures (including critical care time)  Medications Ordered in ED Medications  ketorolac (TORADOL) 15 MG/ML injection 15 mg (15 mg Intravenous Given 07/03/19 1819)  morphine 4 MG/ML injection 4 mg (4 mg Intravenous Given 07/03/19 2004)  iohexol (OMNIPAQUE) 350 MG/ML injection 100 mL (100 mLs Intravenous Contrast Given 07/03/19 2031)  HYDROmorphone (DILAUDID) injection 0.5 mg (0.5 mg Intravenous Given 07/03/19 2219)     Initial Impression / Assessment and Plan / ED Course  I have reviewed the triage vital signs and the nursing notes.  Pertinent labs & imaging results that were available during my care of the patient were reviewed by me and considered in my medical decision making (see chart for details).  Clinical Course as of Jul 03 58  Thu Jul 03, 2019  1559 Review chart, triage notes, will go assess patient   [RD]    Clinical Course User Index [RD] Milagros Lollykstra, Emara Lichter S, MD       53 year old lady with prior history of pulmonary embolism presented to the ER with chief complaint of chest pain.  On exam patient was very well-appearing, noted mild bradycardia but otherwise normal vital signs, lungs clear, significant tenderness to palpation  over anterior chest wall.  EKG without acute ischemic changes, serial troponin within normal limits, no delta troponin.  Given prior history of pulmonary embolism who has new onset chest pain,  check CTA.  This was negative for pulmonary embolism.  Given this work-up, patient's description of symptoms I suspect MSK etiology, costochondritis.  Noted significant bradycardia to high 40s, reviewed this finding and discuss admission for further observation however patient states her symptoms have improved and she has been told many times her heart rate is low and this is not new. Believe appropriate for outpatient management this time, recommended trial of NSAIDs and close recheck with PCP.    After the discussed management above, the patient was determined to be safe for discharge.  The patient was in agreement with this plan and all questions regarding their care were answered.  ED return precautions were discussed and the patient will return to the ED with any significant worsening of condition.   Final Clinical Impressions(s) / ED Diagnoses   Final diagnoses:  Chest pain, unspecified type    ED Discharge Orders         Ordered    naproxen (NAPROSYN) 500 MG tablet  2 times daily     07/03/19 2159           Milagros Lollykstra, Lun Muro S, MD 07/04/19 [phone]

## 2019-07-03 NOTE — ED Notes (Signed)
Patient verbalizes understanding of discharge instructions. Opportunity for questioning and answers were provided. Armband removed by staff, pt discharged from ED via wheelchair.  

## 2019-07-03 NOTE — ED Notes (Addendum)
Patient transported to CT 

## 2019-07-03 NOTE — ED Triage Notes (Signed)
Pt reports bilateral reproducable chest wall pain that started this morning. Pt reports the pain goes across her chest to her left side.

## 2019-07-30 ENCOUNTER — Other Ambulatory Visit: Payer: Self-pay | Admitting: Sports Medicine

## 2019-07-30 DIAGNOSIS — M545 Low back pain, unspecified: Secondary | ICD-10-CM

## 2019-08-25 ENCOUNTER — Other Ambulatory Visit: Payer: Medicaid Other

## 2019-09-07 ENCOUNTER — Encounter (HOSPITAL_COMMUNITY): Payer: Self-pay | Admitting: Emergency Medicine

## 2019-09-07 ENCOUNTER — Emergency Department (HOSPITAL_COMMUNITY): Payer: Medicaid Other

## 2019-09-07 ENCOUNTER — Emergency Department (HOSPITAL_COMMUNITY)
Admission: EM | Admit: 2019-09-07 | Discharge: 2019-09-07 | Disposition: A | Discharge status: Home / Self Care | Payer: Medicaid Other | Attending: Emergency Medicine | Admitting: Emergency Medicine

## 2019-09-07 ENCOUNTER — Other Ambulatory Visit: Payer: Self-pay

## 2019-09-07 DIAGNOSIS — W109XXA Fall (on) (from) unspecified stairs and steps, initial encounter: Secondary | ICD-10-CM | POA: Insufficient documentation

## 2019-09-07 DIAGNOSIS — Z86711 Personal history of pulmonary embolism: Secondary | ICD-10-CM | POA: Insufficient documentation

## 2019-09-07 DIAGNOSIS — Z7722 Contact with and (suspected) exposure to environmental tobacco smoke (acute) (chronic): Secondary | ICD-10-CM | POA: Diagnosis not present

## 2019-09-07 DIAGNOSIS — Y9301 Activity, walking, marching and hiking: Secondary | ICD-10-CM | POA: Diagnosis not present

## 2019-09-07 DIAGNOSIS — S8002XA Contusion of left knee, initial encounter: Secondary | ICD-10-CM

## 2019-09-07 DIAGNOSIS — F319 Bipolar disorder, unspecified: Secondary | ICD-10-CM | POA: Diagnosis not present

## 2019-09-07 DIAGNOSIS — Z7901 Long term (current) use of anticoagulants: Secondary | ICD-10-CM | POA: Insufficient documentation

## 2019-09-07 DIAGNOSIS — I10 Essential (primary) hypertension: Secondary | ICD-10-CM | POA: Insufficient documentation

## 2019-09-07 DIAGNOSIS — Y999 Unspecified external cause status: Secondary | ICD-10-CM | POA: Diagnosis not present

## 2019-09-07 DIAGNOSIS — Y9289 Other specified places as the place of occurrence of the external cause: Secondary | ICD-10-CM | POA: Diagnosis not present

## 2019-09-07 DIAGNOSIS — Z79899 Other long term (current) drug therapy: Secondary | ICD-10-CM | POA: Insufficient documentation

## 2019-09-07 DIAGNOSIS — S8992XA Unspecified injury of left lower leg, initial encounter: Secondary | ICD-10-CM | POA: Diagnosis present

## 2019-09-07 MED ORDER — DICLOFENAC SODIUM 1 % TD GEL: 2.0000 g | Freq: Four times a day (QID) | TRANSDERMAL | 0 refills | Status: DC

## 2019-09-07 NOTE — ED Notes (Signed)
Patient verbalizes understanding of discharge instructions. Opportunity for questioning and answers were provided. Armband removed by staff, pt discharged from ED.  

## 2019-09-07 NOTE — ED Triage Notes (Signed)
Pt tripped and fell down 2 steps yesterday.  C/o pain to L knee.

## 2019-09-07 NOTE — Discharge Instructions (Addendum)
Use the Voltaren gel as directed. Return to the ED if you start to experience worsening symptoms, additional injuries or falls, numbness in arms or legs, leg swelling.

## 2019-09-07 NOTE — ED Provider Notes (Signed)
[facility] Provider Note   CSN: [id number] Arrival date & time: 09/07/19  1255     History   Chief Complaint Chief Complaint  Patient presents with   Fall   Knee Pain    HPI Katie [name] is a 53 y.o. female with a past medical history of hypertension, chronic pain syndrome who presents to ED for evaluation of left knee pain for the past 2 days.  States that she was walking down the steps to her neighbor's house when she missed a step and landed on her left knee.  She has had sharp, aching pain since the injury.  No pain prior to injury.  Mild improvement with ibuprofen.  Denies any head injury, prior fracture, dislocation or procedure in the area, numbness in legs, back pain, wound.    HPI  Past Medical History:  Diagnosis Date   Anxiety    Back pain    Bipolar 1 disorder ([facility])    Chronic pain entered 08/15/2015   Treated with Oxycodone   Depression    Depression    HTN (hypertension)    OA (osteoarthritis)    Obesity    PE (pulmonary embolism)    oct 2014   Sciatica     Patient Active Problem List   Diagnosis Date Noted   Morbid obesity ([facility])    Bradycardia    Pulmonary embolism on long-term anticoagulation therapy ([facility]) 04/06/2016   Pain in the chest    Chronic pain    Chest wall pain    Pulmonary embolism ([facility]) 07/31/2013   Vocal cord dysfunction 02/28/2012   Dyspnea 01/17/2012   Chest pain 11/25/2011   Hypokalemia 11/25/2011   GERD (gastroesophageal reflux disease) 03/08/2011   DYSMENORRHEA 11/10/2010   PLANTAR FASCIITIS 09/14/2010   OSTEOARTHRITIS, KNEES, BILATERAL, 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 01/03/2007   IRRITABLE BOWEL SYNDROME 01/03/2007    Past Surgical History:  Procedure Laterality Date   ADENOIDECTOMY     BIOPSY STOMACH     CARDIAC CAT[place]  2009   [place]   CESAREAN SECTION  2002   x 2, 1997 and 2002   CHOLECYSTECTOMY  2007   KNEE SURGERY Right 2005   TONSILLECTOMY     TYMPANOSTOMY TUBE PLACEMENT       OB History   No obstetric history on file.      Home Medications    Prior to Admission medications   Medication Sig Start Date End Date Taking? Authorizing Provider  cyclobenzaprine (FLEXERIL) 5 MG tablet Take 1-2 tablets (5-10 mg total) by mouth 2 (two) times daily as needed for muscle spasms. 01/15/19   Wieters, Hallie C, PA-C  diazepam (VALIUM) 5 MG tablet Take 5 mg by mouth at bedtime as needed for anxiety.  03/02/16   [provider]  diclofenac sodium (VOLTAREN) 1 % GEL Apply 2 g topically 4 (four) times daily. 09/07/19   Tali Coster, PA-C  furosemide (LASIX) 40 MG tablet Take 1 tablet (40 mg total) by mouth daily. 08/02/13   Zannie CoveJoseph, Preetha, MD  lidocaine (LIDODERM) 5 % Place 1 patch onto the skin daily. Remove & Discard patch within 12 hours or as directed  by MD    [provider]  omeprazole (PRILOSEC) 40 MG capsule Take 40 mg by mouth daily.    [provider]  potassium chloride SA (K-DUR,KLOR-CON) 20 MEQ tablet Take 20 mEq by mouth 2 (two) times daily. 05/03/16   [provider]  Rivaroxaban (XARELTO) 20 MG TABS tablet Take 1 tablet (20 mg total) by mouth daily with supper. Starting 10/18, take 20mg  daily 08/23/13   Domenic Polite, MD    Family History Family History  Problem Relation Age of Onset   Heart disease Mother    Diabetes Father    Asthma Sister        also had rheumatic fever as a child, died age 2    Social History Social History   Tobacco Use   Smoking status: Never Smoker   Smokeless tobacco: Never Used   Tobacco comment: second  hand smoke  Substance Use Topics   Alcohol use: Yes    Alcohol/week: 0.0 standard drinks    Comment: 2-3 x week has a glass of wine   Drug use: No     Allergies   Percocet [oxycodone-acetaminophen], Tylenol [acetaminophen], and Vicodin [hydrocodone-acetaminophen]   Review of Systems Review of Systems  Constitutional: Negative for chills and fever.  Musculoskeletal: Positive for arthralgias and joint swelling.  Neurological: Negative for weakness and numbness.     Physical Exam Updated Vital Signs BP 117/78 (BP Location: Left Arm)    Pulse 60    Temp 97.8 F (36.6 C) (Oral)    Resp 18    LMP 12/28/2015 (Exact Date)    SpO2 99%   Physical Exam Vitals signs and nursing note reviewed.  Constitutional:      General: She is not in acute distress.    Appearance: She is well-developed. She is not diaphoretic.  HENT:     Head: Normocephalic and atraumatic.  Eyes:     General: No scleral icterus.    Conjunctiva/sclera: Conjunctivae normal.  Neck:     Musculoskeletal: Normal range of motion.  Pulmonary:     Effort: Pulmonary effort is normal. No respiratory distress.  Musculoskeletal:        General: Tenderness present. No swelling.     Comments: Diffuse tenderness palpation of the left knee without overlying skin changes.  No edema, warmth or erythema noted.  No calf tenderness. FROM of knee without difficulty.  2+ DP pulse palpated.  Skin:    Findings: No rash.  Neurological:     Mental Status: She is alert.      ED Treatments / Results  Labs (all labs ordered are listed, but only abnormal results are displayed) Labs Reviewed - No data to display  EKG None  Radiology Dg Knee Complete 4 Views Left  Result Date: 09/07/2019 CLINICAL DATA:  Pain after fall EXAM: LEFT KNEE - COMPLETE 4+ VIEW COMPARISON:  None. FINDINGS: There is a small suprapatellar joint effusion. Tricompartmental degenerative changes most prominent the mediolateral compartments. No acute fractures  are seen. IMPRESSION: Small joint effusion. Moderate to severe degenerative changes. No fractures noted. Electronically Signed   By: Dorise Bullion III M.D   On: 09/07/2019 13:57    Procedures Procedures (including critical care time)  Medications Ordered in ED Medications - No data to display   Initial Impression / Assessment and Plan / ED Course  I have reviewed the triage vital signs and the nursing notes.  Pertinent labs & imaging results that were available during my care of the patient  were reviewed by me and considered in my medical decision making (see chart for details).        53 year old female presenting to the ED for left knee pain after fall that occurred 2 days ago.  Describes it as a mechanical fall.  Denies any other injuries.  X-rays negative.  Will treat with rice, Voltaren gel and orthopedic follow-up.  Patient is hemodynamically stable, in NAD, and able to ambulate in the ED. Evaluation does not show pathology that would require ongoing emergent intervention or inpatient treatment. I explained the diagnosis to the patient. Pain has been managed and has no complaints prior to discharge. Patient is comfortable with above plan and is stable for discharge at this time. All questions were answered prior to disposition. Strict return precautions for returning to the ED were discussed. Encouraged follow up with PCP.   An After Visit Summary was printed and given to the patient.   Portions of this note were generated with Scientist, clinical (histocompatibility and immunogenetics). Dictation errors may occur despite best attempts at proofreading.   Final Clinical Impressions(s) / ED Diagnoses   Final diagnoses:  Contusion of left knee, initial encounter    ED Discharge Orders         Ordered    diclofenac sodium (VOLTAREN) 1 % GEL  4 times daily     09/07/19 1526           Dietrich Pates, PA-C 09/07/19 1529    Gerhard Munch, MD 09/07/19 1551

## 2019-12-29 DIAGNOSIS — M1712 Unilateral primary osteoarthritis, left knee: Secondary | ICD-10-CM | POA: Diagnosis present

## 2019-12-29 NOTE — H&P (Addendum)
KNEE ARTHROPLASTY ADMISSION H&P  Patient ID: Katie [name] MRN: [id number] DOB/AGE: 1966/08/23 54 y.o.  Chief Complaint: left knee pain.  Planned Procedure Date: 01/20/20 Medical Clearance by Dr. Jonelle Sidle    HPI: Katie [name] is a 55 y.o. female with a history of anxiety, depression, HTN, and PE after travel in 2016 now on Xarelto who presents for evaluation of OA LEFT KNEE. The patient has a history of pain and functional disability in the left knee due to arthritis and has failed non-surgical conservative treatments for greater than 12 weeks to include NSAID's and/or analgesics, corticosteriod injections, weight reduction as appropriate and activity modification.  Onset of symptoms was gradual, starting >10 years ago with gradually worsening course since that time.  Patient currently rates pain at 9 out of 10 with activity. Patient has night pain, worsening of pain with activity and weight bearing and pain that interferes with activities of daily living.  Patient has evidence of subchondral cysts, subchondral sclerosis, periarticular osteophytes and joint space narrowing by imaging studies.  There is no active infection.  Past Medical History:  Diagnosis Date  . Anxiety   . Back pain   . Bipolar 1 disorder ([place])   . Chronic pain entered 08/15/2015   Treated with Oxycodone  . Depression   . Depression   . HTN (hypertension)   . OA (osteoarthritis)   . Obesity   . PE (pulmonary embolism)    oct 2014  . Sciatica    Past Surgical History:  Procedure Laterality Date  . ADENOIDECTOMY    . BIOPSY STOMACH    . CARDIAC CATHETERIZATION  2009   normal; in [place]  . CESAREAN SECTION  2002   x 2, 1997 and 2002  . CHOLECYSTECTOMY  2007  . KNEE SURGERY Right 2005  . TONSILLECTOMY    . TYMPANOSTOMY TUBE PLACEMENT     Allergies  Allergen Reactions  . Percocet [Oxycodone-Acetaminophen] Hives    From the Tylenol, she can take oxycodone by itself  . Tylenol [Acetaminophen] Hives and  Swelling  . Vicodin [Hydrocodone-Acetaminophen] Hives    Hives & swelling  --Patient States that she has tolerated both Tylenol and Oxycodone independently without adverse effect.   Prior to Admission medications   Medication Sig Start Date End Date Taking? Authorizing Provider         diazepam (VALIUM) 5 MG tablet Take 5 mg by mouth BID as needed for anxiety.  03/02/16   [provider]  diclofenac sodium (VOLTAREN) 1 % GEL Apply 2 g topically 4 (four) times daily. 09/07/19   Khatri, Hina, PA-C  furosemide (LASIX) 40 MG tablet Take 1 tablet (40 mg total) by mouth daily. 08/02/13   Domenic Polite, MD  lidocaine (LIDODERM) 5 % Place 1 patch onto the skin daily. Remove & Discard patch within 12 hours or as directed by MD    [provider]  omeprazole (PRILOSEC) 40 MG capsule Take 40 mg by mouth daily.    [provider]  potassium chloride SA (K-DUR,KLOR-CON) 20 MEQ tablet Take 40 mEq by mouth BID. 05/03/16   [provider]  Rivaroxaban (XARELTO) 20 MG TABS tablet Take 1 tablet (20 mg total) by mouth daily with supper. Starting 10/18, take 20mg  daily 08/23/13   Domenic Polite, MD   Social History: Married.  Smokes marijuana.  Does not smoke tobacco.  Occasional alcohol use.  Family History  Problem Relation Age of Onset  . Heart disease Mother   . Diabetes  Father   . Asthma Sister        also had rheumatic fever as a child, died age 27    ROS: Currently denies lightheadedness, dizziness, Fever, chills, CP, SOB.   No personal history of MI, or CVA. No loose teeth or dentures.  She wears contacts.  Diarrhea now chronic / intermittent after cholecystectomy.   All other systems have been reviewed and were otherwise currently negative with the exception of those mentioned in the HPI and as above.  Objective: Vitals: Ht: 5'6" Wt: 213 lbs Temp: 97.7 BP: 127/87 Pulse: 71 O2 89% on room air.   Physical Exam: General: Alert, NAD.  Antalgic Gait  HEENT:  EOMI, Good Neck Extension  Pulm: No increased work of breathing.  Clear B/L A/P w/o crackle or wheeze.  CV: RRR, No m/g/r appreciated  GI: soft, NT, ND Neuro: Neuro without gross focal deficit.  Sensation intact distally Skin: No lesions in the area of chief complaint MSK/Surgical Site: Left knee w/o redness or effusion.  Mostly medial JLT. ROM 5-95.  5/5 strength in extension and flexion.  +EHL/FHL.  NVI.  Stable varus and valgus stress.    Imaging Review Plain radiographs demonstrate severe degenerative joint disease of both knees.  Preoperative templating of the joint replacement has been completed, documented, and submitted to the Operating Room personnel in order to optimize intra-operative equipment management.  Assessment: OA LEFT KNEE Principal Problem:   Primary osteoarthritis of left knee Active Problems:   Depression   Essential hypertension, benign   GERD (gastroesophageal reflux disease)   Pulmonary embolism on long-term anticoagulation therapy (HCC)   Plan: Plan for Procedure(s): TOTAL KNEE ARTHROPLASTY  The patient history, physical exam, clinical judgement of the provider and imaging are consistent with end stage degenerative joint disease and total joint arthroplasty is deemed medically necessary. The treatment options including medical management, injection therapy, and arthroplasty were discussed at length. The risks and benefits of Procedure(s): TOTAL KNEE ARTHROPLASTY were presented and reviewed.  The risks of nonoperative treatment, versus surgical intervention including but not limited to continued pain, aseptic loosening, stiffness, dislocation/subluxation, infection, bleeding, nerve injury, blood clots, cardiopulmonary complications, morbidity, mortality, among others were discussed. The patient verbalizes understanding and wishes to proceed with the plan.  Patient is being admitted for inpatient treatment for surgery, pain control, PT, prophylactic  antibiotics, VTE prophylaxis, progressive ambulation, ADL's and discharge planning.    The patient does not meet the criteria for TXA dt PE history.    Xarelto held 3 days prior to surgery and resumed postoperatively for DVT prophylaxis in addition to SCDs, and early ambulation.  Plan for Tylenol 1000 mg TID, Gabapentin 300 mg TID, 5 mg oxycodone 1 tab q 4hrs prn for pain.  500 mg Robaxin up to TID for spasm.  Continue Omeprazole for gastric protection.  No PO NSAID Rx as she is on Xarelto.  The patient is planning to be discharged home with HHPT ([facility]) in care of her Daughter Trilby Leaver, son, and husband.  Possible same day discharge, so this order set will be used postoperatively to include LR bolus in pacu and before PT.     Patient's anticipated LOS is less than 2 midnights, meeting these requirements: - Younger than 25 - Lives within 1 hour of care - Has a competent adult at home to recover with post-op recover - NO history of  - Chronic pain requiring opiods  - Diabetes  - Coronary Artery Disease  - Heart failure  -  Heart attack  - Stroke  - Cardiac arrhythmia  - Respiratory Failure/COPD  - Renal failure  - Anemia  - Advanced Liver disease     Albina Billet III, PA-C 12/29/2019 2:26 PM

## 2020-01-09 ENCOUNTER — Encounter (HOSPITAL_COMMUNITY): Payer: Medicaid Other

## 2020-01-16 ENCOUNTER — Ambulatory Visit: Payer: Medicaid Other | Attending: Internal Medicine

## 2020-01-16 DIAGNOSIS — Z20822 Contact with and (suspected) exposure to covid-19: Secondary | ICD-10-CM

## 2020-01-16 NOTE — Progress Notes (Signed)
DUE TO COVID-19 ONLY ONE VISITOR IS ALLOWED TO COME WITH YOU AND STAY IN THE WAITING ROOM ONLY DURING PRE OP AND PROCEDURE DAY OF SURGERY. THE 1 VISITOR MAY VISIT WITH YOU AFTER SURGERY IN YOUR PRIVATE ROOM DURING VISITING HOURS ONLY!  YOU NEED TO HAVE A COVID 19 TEST ON_______ @_______ , THIS TEST MUST BE DONE BEFORE SURGERY, COME  [address], [place] , .  [facility]) ONCE YOUR COVID TEST IS COMPLETED, PLEASE BEGIN THE QUARANTINE INSTRUCTIONS AS OUTLINED IN YOUR HANDOUT.                Katie [name]  01/16/2020   Your procedure is scheduled on:  01/27/2020   Report to [facility] Main  Entrance   Report to admitting at   0730AM      Call this number if you have problems the morning of surgery [phone]    Remember:   BRUSH YOUR TEETH MORNING OF SURGERY AND RINSE YOUR MOUTH OUT, NO CHEWING GUM CANDY OR MINTS.     Take these medicines the morning of surgery with A SIP OF WATER:  pRILOSEC                                 You may not have any metal on your body including hair pins and              piercings  Do not wear jewelry, make-up, lotions, powders or perfumes, deodorant             Do not wear nail polish on your fingernails.  Do not shave  48 hours prior to surgery.     Do not bring valuables to the hospital. [facility] IS NOT             RESPONSIBLE   FOR VALUABLES.  Contacts, dentures or bridgework may not be worn into surgery.  Leave suitcase in the car. After surgery it may be brought to your room.     Patients discharged the day of surgery will not be allowed to drive home. IF YOU ARE HAVING SURGERY AND GOING HOME THE SAME DAY, YOU MUST HAVE AN ADULT TO DRIVE YOU HOME AND BE WITH YOU FOR 24 HOURS. YOU MAY GO HOME BY TAXI OR UBER OR ORTHERWISE, BUT AN ADULT MUST ACCOMPANY YOU HOME AND STAY WITH YOU FOR 24 HOURS.  Name and phone number of your driver:  Special Instructions: N/A              Please read over the following fact  sheets you were given: _____________________________________________________________________             NO SOLID FOOD AFTER MIDNIGHT THE NIGHT PRIOR TO SURGERY. NOTHING BY MOUTH EXCEPT CLEAR LIQUIDS UNTIL  0430AM. . PLEASE FINISH ENSURE DRINK PER SURGEON ORDER  WHICH NEEDS TO BE COMPLETED AT .10-16-1998  WHAT IS A BLOOD TRANSFUSION? Blood Transfusion Information  A transfusion is the replacement of blood or some of its parts. Blood is made up of multiple cells which provide different functions.  Red blood cells carry oxygen and are used for blood loss replacement.  White blood cells fight against infection.  Platelets control bleeding.  Plasma helps clot blood.  Other blood products are available for specialized needs, such as hemophilia or other clotting disorders. BEFORE THE TRANSFUSION  Who gives blood for transfusions?   Healthy volunteers who  are fully evaluated to make sure their blood is safe. This is blood bank blood. Transfusion therapy is the safest it has ever been in the practice of medicine. Before blood is taken from a donor, a complete history is taken to make sure that person has no history of diseases nor engages in risky social behavior (examples are intravenous drug use or sexual activity with multiple partners). The donor's travel history is screened to minimize risk of transmitting infections, such as malaria. The donated blood is tested for signs of infectious diseases, such as HIV and hepatitis. The blood is then tested to be sure it is compatible with you in order to minimize the chance of a transfusion reaction. If you or a relative donates blood, this is often done in anticipation of surgery and is not appropriate for emergency situations. It takes many days to process the donated blood. RISKS AND COMPLICATIONS Although transfusion therapy is very safe and saves many lives, the main dangers of transfusion include:   Getting an infectious disease.  Developing a  transfusion reaction. This is an allergic reaction to something in the blood you were given. Every precaution is taken to prevent this. The decision to have a blood transfusion has been considered carefully by your caregiver before blood is given. Blood is not given unless the benefits outweigh the risks. AFTER THE TRANSFUSION  Right after receiving a blood transfusion, you will usually feel much better and more energetic. This is especially true if your red blood cells have gotten low (anemic). The transfusion raises the level of the red blood cells which carry oxygen, and this usually causes an energy increase.  The nurse administering the transfusion will monitor you carefully for complications. HOME CARE INSTRUCTIONS  No special instructions are needed after a transfusion. You may find your energy is better. Speak with your caregiver about any limitations on activity for underlying diseases you may have. SEEK MEDICAL CARE IF:   Your condition is not improving after your transfusion.  You develop redness or irritation at the intravenous (IV) site. SEEK IMMEDIATE MEDICAL CARE IF:  Any of the following symptoms occur over the next 12 hours:  Shaking chills.  You have a temperature by mouth above 102 F (38.9 C), not controlled by medicine.  Chest, back, or muscle pain.  People around you feel you are not acting correctly or are confused.  Shortness of breath or difficulty breathing.  Dizziness and fainting.  You get a rash or develop hives.  You have a decrease in urine output.  Your urine turns a dark color or changes to pink, red, or brown. Any of the following symptoms occur over the next 10 days:  You have a temperature by mouth above 102 F (38.9 C), not controlled by medicine.  Shortness of breath.  Weakness after normal activity.  The white part of the eye turns yellow (jaundice).  You have a decrease in the amount of urine or are urinating less often.  Your  urine turns a dark color or changes to pink, red, or brown. Document Released: 10/20/2000 Document Revised: 01/15/2012 Document Reviewed: 06/08/2008 ExitCare Patient Information 2014 [place], [place].  _______________________________________________________________________  Incentive Spirometer  An incentive spirometer is a tool that can help keep your lungs clear and active. This tool measures how well you are filling your lungs with each breath. Taking long deep breaths may help reverse or decrease the chance of developing breathing (pulmonary) problems (especially infection) following:  A long period of time  when you are unable to move or be active. BEFORE THE PROCEDURE   If the spirometer includes an indicator to show your best effort, your nurse or respiratory therapist will set it to a desired goal.  If possible, sit up straight or lean slightly forward. Try not to slouch.  Hold the incentive spirometer in an upright position. INSTRUCTIONS FOR USE  1. Sit on the edge of your bed if possible, or sit up as far as you can in bed or on a chair. 2. Hold the incentive spirometer in an upright position. 3. Breathe out normally. 4. Place the mouthpiece in your mouth and seal your lips tightly around it. 5. Breathe in slowly and as deeply as possible, raising the piston or the ball toward the top of the column. 6. Hold your breath for 3-5 seconds or for as long as possible. Allow the piston or ball to fall to the bottom of the column. 7. Remove the mouthpiece from your mouth and breathe out normally. 8. Rest for a few seconds and repeat Steps 1 through 7 at least 10 times every 1-2 hours when you are awake. Take your time and take a few normal breaths between deep breaths. 9. The spirometer may include an indicator to show your best effort. Use the indicator as a goal to work toward during each repetition. 10. After each set of 10 deep breaths, practice coughing to be sure your lungs are  clear. If you have an incision (the cut made at the time of surgery), support your incision when coughing by placing a pillow or rolled up towels firmly against it. Once you are able to get out of bed, walk around indoors and cough well. You may stop using the incentive spirometer when instructed by your caregiver.  RISKS AND COMPLICATIONS  Take your time so you do not get dizzy or light-headed.  If you are in pain, you may need to take or ask for pain medication before doing incentive spirometry. It is harder to take a deep breath if you are having pain. AFTER USE  Rest and breathe slowly and easily.  It can be helpful to keep track of a log of your progress. Your caregiver can provide you with a simple table to help with this. If you are using the spirometer at home, follow these instructions: SEEK MEDICAL CARE IF:   You are having difficultly using the spirometer.  You have trouble using the spirometer as often as instructed.  Your pain medication is not giving enough relief while using the spirometer.  You develop fever of 100.5 F (38.1 C) or higher. SEEK IMMEDIATE MEDICAL CARE IF:   You cough up bloody sputum that had not been present before.  You develop fever of 102 F (38.9 C) or greater.  You develop worsening pain at or near the incision site. MAKE SURE YOU:   Understand these instructions.  Will watch your condition.  Will get help right away if you are not doing well or get worse. Document Released: 03/05/2007 Document Revised: 01/15/2012 Document Reviewed: 05/06/2007 ExitCare Patient Information 2014 [place].   ________________________________________________________________________ [facility] - Preparing for Surgery Before surgery, you can play an important role.  Because skin is not sterile, your skin needs to be as free of germs as possible.  You can reduce the number of germs on your skin by washing with CHG (chlorahexidine gluconate) soap before  surgery.  CHG is an antiseptic cleaner which kills germs and bonds with  the skin to continue killing germs even after washing. Please DO NOT use if you have an allergy to CHG or antibacterial soaps.  If your skin becomes reddened/irritated stop using the CHG and inform your nurse when you arrive at Short Stay. Do not shave (including legs and underarms) for at least 48 hours prior to the first CHG shower.  You may shave your face/neck. Please follow these instructions carefully:  1.  Shower with CHG Soap the night before surgery and the  morning of Surgery.  2.  If you choose to wash your hair, wash your hair first as usual with your  normal  shampoo.  3.  After you shampoo, rinse your hair and body thoroughly to remove the  shampoo.                           4.  Use CHG as you would any other liquid soap.  You can apply chg directly  to the skin and wash                       Gently with a scrungie or clean washcloth.  5.  Apply the CHG Soap to your body ONLY FROM THE NECK DOWN.   Do not use on face/ open                           Wound or open sores. Avoid contact with eyes, ears mouth and genitals (private parts).                       Wash face,  Genitals (private parts) with your normal soap.             6.  Wash thoroughly, paying special attention to the area where your surgery  will be performed.  7.  Thoroughly rinse your body with warm water from the neck down.  8.  DO NOT shower/wash with your normal soap after using and rinsing off  the CHG Soap.                9.  Pat yourself dry with a clean towel.            10.  Wear clean pajamas.            11.  Place clean sheets on your bed the night of your first shower and do not  sleep with pets. Day of Surgery : Do not apply any lotions/deodorants the morning of surgery.  Please wear clean clothes to the hospital/surgery center.  FAILURE TO FOLLOW THESE INSTRUCTIONS MAY RESULT IN THE CANCELLATION OF YOUR SURGERY PATIENT  SIGNATURE_________________________________  NURSE SIGNATURE__________________________________  ________________________________________________________________________

## 2020-01-17 LAB — NOVEL CORONAVIRUS, NAA: SARS-CoV-2, NAA: NOT DETECTED

## 2020-01-19 ENCOUNTER — Encounter (HOSPITAL_COMMUNITY)
Admission: RE | Admit: 2020-01-19 | Discharge: 2020-01-19 | Disposition: A | Discharge status: Home / Self Care | Payer: Medicaid Other | Source: Ambulatory Visit | Attending: Orthopedic Surgery | Admitting: Orthopedic Surgery

## 2020-01-19 ENCOUNTER — Other Ambulatory Visit: Payer: Self-pay

## 2020-01-19 ENCOUNTER — Encounter (HOSPITAL_COMMUNITY): Payer: Self-pay | Admitting: Orthopedic Surgery

## 2020-01-19 ENCOUNTER — Encounter (HOSPITAL_COMMUNITY)
Admission: RE | Admit: 2020-01-19 | Discharge: 2020-01-19 | Disposition: A | Discharge status: Home / Self Care | Payer: Medicaid Other | Source: Ambulatory Visit | Attending: Urology | Admitting: Urology

## 2020-01-19 DIAGNOSIS — Z01812 Encounter for preprocedural laboratory examination: Secondary | ICD-10-CM | POA: Insufficient documentation

## 2020-01-19 LAB — CBC
HCT: 48.7 % — ABNORMAL HIGH (ref 36.0–46.0)
Hemoglobin: 16 g/dL — ABNORMAL HIGH (ref 12.0–15.0)
MCH: 31.5 pg (ref 26.0–34.0)
MCHC: 32.9 g/dL (ref 30.0–36.0)
MCV: 95.9 fL (ref 80.0–100.0)
Platelets: 353 10*3/uL (ref 150–400)
RBC: 5.08 MIL/uL (ref 3.87–5.11)
RDW: 13.1 % (ref 11.5–15.5)
WBC: 9.3 10*3/uL (ref 4.0–10.5)
nRBC: 0 % (ref 0.0–0.2)

## 2020-01-19 LAB — BASIC METABOLIC PANEL
Anion gap: 13 (ref 5–15)
BUN: 5 mg/dL — ABNORMAL LOW (ref 6–20)
CO2: 33 mmol/L — ABNORMAL HIGH (ref 22–32)
Calcium: 9.1 mg/dL (ref 8.9–10.3)
Chloride: 91 mmol/L — ABNORMAL LOW (ref 98–111)
Creatinine, Ser: 0.64 mg/dL (ref 0.44–1.00)
GFR calc Af Amer: >60 mL/min (ref >60)
GFR calc non Af Amer: >60 mL/min (ref >60)
Glucose, Bld: 94 mg/dL (ref 70–99)
Potassium: 3 mmol/L — ABNORMAL LOW (ref 3.5–5.1)
Sodium: 137 mmol/L (ref 135–145)

## 2020-01-19 LAB — URINALYSIS, ROUTINE W REFLEX MICROSCOPIC
Bilirubin Urine: NEGATIVE
Glucose, UA: NEGATIVE mg/dL
Hgb urine dipstick: NEGATIVE
Ketones, ur: NEGATIVE mg/dL
Leukocytes,Ua: NEGATIVE
Nitrite: NEGATIVE
Protein, ur: NEGATIVE mg/dL
Specific Gravity, Urine: 1.004 — ABNORMAL LOW (ref 1.005–1.030)
pH: 7 (ref 5.0–8.0)

## 2020-01-19 LAB — SURGICAL PCR SCREEN
MRSA, PCR: NEGATIVE
Staphylococcus aureus: NEGATIVE

## 2020-01-19 NOTE — Progress Notes (Signed)
Anesthesia Review:  PCP: Cardiologist : Chest x-ray : EKG :07/04/2019  Echo :2017  Cardiac Cath :  Sleep Study/ CPAP : Fasting Blood Sugar :      / Checks Blood Sugar -- times a day:   Blood Thinner/ Instructions /Last Dose: ASA / Instructions/ Last Dose :   Patient denies shortness of breath, chest pain, fever, and cough at this phone interview. Please note potassium of 3.o done today at lab appt.

## 2020-01-19 NOTE — Progress Notes (Signed)
Potassium 3.0 on labs of 01/19/2020.  Given to Katie [name] for review.

## 2020-01-19 NOTE — Progress Notes (Signed)
BMP done 01/19/2020 routed via epic to Dr Margarita Rana.

## 2020-01-23 ENCOUNTER — Other Ambulatory Visit (HOSPITAL_COMMUNITY)
Admission: RE | Admit: 2020-01-23 | Discharge: 2020-01-23 | Disposition: A | Discharge status: Home / Self Care | Payer: Medicaid Other | Source: Ambulatory Visit | Attending: Orthopedic Surgery | Admitting: Orthopedic Surgery

## 2020-01-23 DIAGNOSIS — Z20822 Contact with and (suspected) exposure to covid-19: Secondary | ICD-10-CM | POA: Diagnosis not present

## 2020-01-23 DIAGNOSIS — Z01812 Encounter for preprocedural laboratory examination: Secondary | ICD-10-CM | POA: Diagnosis present

## 2020-01-23 LAB — SARS CORONAVIRUS 2 (TAT 6-24 HRS): SARS Coronavirus 2: NEGATIVE

## 2020-01-26 MED ORDER — BUPIVACAINE LIPOSOME 1.3 % IJ SUSP
20.0000 mL | INTRAMUSCULAR | Status: DC
  Filled 2020-01-26: qty 20

## 2020-01-26 NOTE — Anesthesia Preprocedure Evaluation (Addendum)
Anesthesia Evaluation  Patient identified by MRN, date of birth, ID band Patient awake    Reviewed: Allergy & Precautions, NPO status , Patient's Chart, lab work & pertinent test results  History of Anesthesia Complications Negative for: history of anesthetic complications  Airway Mallampati: II  TM Distance: >3 FB Neck ROM: Full    Dental no notable dental hx. (+) Dental Advisory Given   Pulmonary neg pulmonary ROS,    Pulmonary exam normal        Cardiovascular hypertension, Pt. on medications Normal cardiovascular exam  Study Conclusions 2017  - Left ventricle: The cavity size was normal. Systolic function was  normal. The estimated ejection fraction was in the range of 60%  to 65%. Wall motion was normal; there were no regional wall  motion abnormalities.  - Left atrium: The atrium was mildly dilated.  - Pulmonary arteries: Systolic pressure was mildly increased. PA  peak pressure: 32 mm Hg (S).    Neuro/Psych PSYCHIATRIC DISORDERS Anxiety Depression Bipolar Disorder negative neurological ROS     GI/Hepatic Neg liver ROS, GERD  Medicated,  Endo/Other  negative endocrine ROS  Renal/GU negative Renal ROS     Musculoskeletal negative musculoskeletal ROS (+)   Abdominal   Peds  Hematology negative hematology ROS (+)   Anesthesia Other Findings Day of surgery medications reviewed with the patient.  Reproductive/Obstetrics                            Anesthesia Physical Anesthesia Plan  ASA: II  Anesthesia Plan: Spinal   Post-op Pain Management:  Regional for Post-op pain   Induction:   PONV Risk Score and Plan: 3 and Ondansetron and Dexamethasone  Airway Management Planned: Natural Airway  Additional Equipment:   Intra-op Plan:   Post-operative Plan:   Informed Consent: I have reviewed the patients History and Physical, chart, labs and discussed the procedure  including the risks, benefits and alternatives for the proposed anesthesia with the patient or authorized representative who has indicated his/her understanding and acceptance.     Dental advisory given  Plan Discussed with: CRNA and Anesthesiologist  Anesthesia Plan Comments:        Anesthesia Quick Evaluation

## 2020-01-27 ENCOUNTER — Encounter (HOSPITAL_COMMUNITY)
Admission: RE | Disposition: A | Discharge status: Home / Self Care | Payer: Self-pay | Source: Home / Self Care | Attending: Orthopedic Surgery

## 2020-01-27 ENCOUNTER — Encounter (HOSPITAL_COMMUNITY): Payer: Self-pay | Admitting: Orthopedic Surgery

## 2020-01-27 ENCOUNTER — Ambulatory Visit (HOSPITAL_COMMUNITY): Payer: Medicaid Other | Admitting: Anesthesiology

## 2020-01-27 ENCOUNTER — Ambulatory Visit (HOSPITAL_COMMUNITY): Payer: Medicaid Other | Admitting: Physician Assistant

## 2020-01-27 ENCOUNTER — Other Ambulatory Visit: Payer: Self-pay

## 2020-01-27 ENCOUNTER — Observation Stay (HOSPITAL_COMMUNITY)
Admission: RE | Admit: 2020-01-27 | Discharge: 2020-01-28 | Disposition: A | Discharge status: Home / Self Care | Payer: Medicaid Other | Attending: Orthopedic Surgery | Admitting: Orthopedic Surgery

## 2020-01-27 ENCOUNTER — Observation Stay (HOSPITAL_COMMUNITY): Payer: Medicaid Other

## 2020-01-27 DIAGNOSIS — Z7901 Long term (current) use of anticoagulants: Secondary | ICD-10-CM | POA: Diagnosis not present

## 2020-01-27 DIAGNOSIS — Z79899 Other long term (current) drug therapy: Secondary | ICD-10-CM | POA: Diagnosis not present

## 2020-01-27 DIAGNOSIS — I10 Essential (primary) hypertension: Secondary | ICD-10-CM | POA: Diagnosis not present

## 2020-01-27 DIAGNOSIS — Z86711 Personal history of pulmonary embolism: Secondary | ICD-10-CM | POA: Insufficient documentation

## 2020-01-27 DIAGNOSIS — I2699 Other pulmonary embolism without acute cor pulmonale: Secondary | ICD-10-CM | POA: Diagnosis present

## 2020-01-27 DIAGNOSIS — F329 Major depressive disorder, single episode, unspecified: Secondary | ICD-10-CM | POA: Diagnosis present

## 2020-01-27 DIAGNOSIS — Z885 Allergy status to narcotic agent status: Secondary | ICD-10-CM | POA: Diagnosis not present

## 2020-01-27 DIAGNOSIS — K219 Gastro-esophageal reflux disease without esophagitis: Secondary | ICD-10-CM | POA: Diagnosis present

## 2020-01-27 DIAGNOSIS — F419 Anxiety disorder, unspecified: Secondary | ICD-10-CM | POA: Diagnosis not present

## 2020-01-27 DIAGNOSIS — M1712 Unilateral primary osteoarthritis, left knee: Secondary | ICD-10-CM | POA: Diagnosis present

## 2020-01-27 DIAGNOSIS — F32A Depression, unspecified: Secondary | ICD-10-CM | POA: Diagnosis present

## 2020-01-27 DIAGNOSIS — K589 Irritable bowel syndrome without diarrhea: Secondary | ICD-10-CM

## 2020-01-27 DIAGNOSIS — Z96659 Presence of unspecified artificial knee joint: Secondary | ICD-10-CM

## 2020-01-27 DIAGNOSIS — Z886 Allergy status to analgesic agent status: Secondary | ICD-10-CM | POA: Diagnosis not present

## 2020-01-27 HISTORY — DX: Dyspnea, unspecified: R06.00

## 2020-01-27 HISTORY — PX: TOTAL KNEE ARTHROPLASTY: SHX125

## 2020-01-27 HISTORY — DX: Personal history of other diseases of the respiratory system: Z87.09

## 2020-01-27 LAB — COMPREHENSIVE METABOLIC PANEL
ALT: 5 U/L (ref 0–44)
AST: 14 U/L — ABNORMAL LOW (ref 15–41)
Albumin: 3.5 g/dL (ref 3.5–5.0)
Alkaline Phosphatase: 75 U/L (ref 38–126)
Anion gap: 9 (ref 5–15)
BUN: 5 mg/dL — ABNORMAL LOW (ref 6–20)
CO2: 33 mmol/L — ABNORMAL HIGH (ref 22–32)
Calcium: 8.8 mg/dL — ABNORMAL LOW (ref 8.9–10.3)
Chloride: 97 mmol/L — ABNORMAL LOW (ref 98–111)
Creatinine, Ser: 0.58 mg/dL (ref 0.44–1.00)
GFR calc Af Amer: >60 mL/min (ref >60)
GFR calc non Af Amer: >60 mL/min (ref >60)
Glucose, Bld: 86 mg/dL (ref 70–99)
Potassium: 3.6 mmol/L (ref 3.5–5.1)
Sodium: 139 mmol/L (ref 135–145)
Total Bilirubin: 0.9 mg/dL (ref 0.3–1.2)
Total Protein: 7.6 g/dL (ref 6.5–8.1)

## 2020-01-27 SURGERY — ARTHROPLASTY, KNEE, TOTAL
Anesthesia: Spinal | Site: Knee | Laterality: Left

## 2020-01-27 MED ORDER — METHOCARBAMOL 500 MG PO TABS: 500.0000 mg | ORAL_TABLET | Freq: Three times a day (TID) | ORAL | 0 refills | Status: DC | PRN

## 2020-01-27 MED ORDER — SODIUM CHLORIDE (PF) 0.9 % IJ SOLN
INTRAMUSCULAR | Status: AC
  Filled 2020-01-27: qty 30

## 2020-01-27 MED ORDER — POVIDONE-IODINE 10 % EX SWAB: 2.0000 "application " | Freq: Once | CUTANEOUS | Status: DC

## 2020-01-27 MED ORDER — DEXAMETHASONE SODIUM PHOSPHATE 10 MG/ML IJ SOLN
10.0000 mg | Freq: Once | INTRAMUSCULAR | Status: AC
  Administered 2020-01-28: 10 mg via INTRAVENOUS
  Filled 2020-01-27: qty 1

## 2020-01-27 MED ORDER — DEXAMETHASONE SODIUM PHOSPHATE 10 MG/ML IJ SOLN
INTRAMUSCULAR | Status: DC | PRN
  Administered 2020-01-27: 10 mg via INTRAVENOUS

## 2020-01-27 MED ORDER — PHENTERMINE HCL 37.5 MG PO TABS: 37.5000 mg | ORAL_TABLET | ORAL | Status: DC

## 2020-01-27 MED ORDER — CEFAZOLIN SODIUM-DEXTROSE 2-4 GM/100ML-% IV SOLN
INTRAVENOUS | Status: AC
  Filled 2020-01-27: qty 100

## 2020-01-27 MED ORDER — ONDANSETRON HCL 4 MG/2ML IJ SOLN
INTRAMUSCULAR | Status: DC | PRN
  Administered 2020-01-27: 4 mg via INTRAVENOUS

## 2020-01-27 MED ORDER — ALBUMIN HUMAN 5 % IV SOLN
INTRAVENOUS | Status: AC
  Filled 2020-01-27: qty 250

## 2020-01-27 MED ORDER — TRANEXAMIC ACID-NACL 1000-0.7 MG/100ML-% IV SOLN
INTRAVENOUS | Status: AC
  Filled 2020-01-27: qty 100

## 2020-01-27 MED ORDER — OXYCODONE HCL 5 MG PO TABS
5.0000 mg | ORAL_TABLET | ORAL | Status: DC | PRN
  Administered 2020-01-27 – 2020-01-28 (×3): 10 mg via ORAL
  Filled 2020-01-27 (×2): qty 2
  Filled 2020-01-27: qty 1
  Filled 2020-01-27: qty 2

## 2020-01-27 MED ORDER — EPHEDRINE 5 MG/ML INJ
INTRAVENOUS | Status: AC
  Filled 2020-01-27: qty 10

## 2020-01-27 MED ORDER — DIPHENHYDRAMINE HCL 12.5 MG/5ML PO ELIX: 12.5000 mg | ORAL_SOLUTION | ORAL | Status: DC | PRN

## 2020-01-27 MED ORDER — SORBITOL 70 % SOLN
30.0000 mL | Freq: Every day | Status: DC | PRN
  Filled 2020-01-27: qty 30

## 2020-01-27 MED ORDER — LIDOCAINE 2% (20 MG/ML) 5 ML SYRINGE
INTRAMUSCULAR | Status: DC | PRN
  Administered 2020-01-27: 40 mg via INTRAVENOUS

## 2020-01-27 MED ORDER — METHOCARBAMOL 500 MG IVPB - SIMPLE MED
500.0000 mg | Freq: Four times a day (QID) | INTRAVENOUS | Status: DC | PRN
  Filled 2020-01-27: qty 50

## 2020-01-27 MED ORDER — BUPIVACAINE LIPOSOME 1.3 % IJ SUSP
INTRAMUSCULAR | Status: DC | PRN
  Administered 2020-01-27: 20 mL

## 2020-01-27 MED ORDER — MIDAZOLAM HCL 5 MG/5ML IJ SOLN
INTRAMUSCULAR | Status: DC | PRN
  Administered 2020-01-27: 2 mg via INTRAVENOUS

## 2020-01-27 MED ORDER — PHENYLEPHRINE 40 MCG/ML (10ML) SYRINGE FOR IV PUSH (FOR BLOOD PRESSURE SUPPORT)
PREFILLED_SYRINGE | INTRAVENOUS | Status: DC | PRN
  Administered 2020-01-27: 80 ug via INTRAVENOUS
  Administered 2020-01-27: 40 ug via INTRAVENOUS

## 2020-01-27 MED ORDER — MIDAZOLAM HCL 2 MG/2ML IJ SOLN
INTRAMUSCULAR | Status: AC
  Filled 2020-01-27: qty 2

## 2020-01-27 MED ORDER — DEXAMETHASONE SODIUM PHOSPHATE 10 MG/ML IJ SOLN
INTRAMUSCULAR | Status: AC
  Filled 2020-01-27: qty 1

## 2020-01-27 MED ORDER — METHOCARBAMOL 500 MG PO TABS
500.0000 mg | ORAL_TABLET | Freq: Four times a day (QID) | ORAL | Status: DC | PRN
  Administered 2020-01-27 – 2020-01-28 (×3): 500 mg via ORAL
  Filled 2020-01-27 (×3): qty 1

## 2020-01-27 MED ORDER — FENTANYL CITRATE (PF) 100 MCG/2ML IJ SOLN: 25.0000 ug | INTRAMUSCULAR | Status: DC | PRN

## 2020-01-27 MED ORDER — 0.9 % SODIUM CHLORIDE (POUR BTL) OPTIME
TOPICAL | Status: DC | PRN
  Administered 2020-01-27: 1000 mL

## 2020-01-27 MED ORDER — ONDANSETRON HCL 4 MG/2ML IJ SOLN
INTRAMUSCULAR | Status: AC
  Filled 2020-01-27: qty 2

## 2020-01-27 MED ORDER — CEFAZOLIN SODIUM-DEXTROSE 2-4 GM/100ML-% IV SOLN
2.0000 g | INTRAVENOUS | Status: AC
  Administered 2020-01-27: 2 g via INTRAVENOUS

## 2020-01-27 MED ORDER — ALBUMIN HUMAN 5 % IV SOLN: INTRAVENOUS | Status: DC | PRN

## 2020-01-27 MED ORDER — METOCLOPRAMIDE HCL 5 MG PO TABS: 5.0000 mg | ORAL_TABLET | Freq: Three times a day (TID) | ORAL | Status: DC | PRN

## 2020-01-27 MED ORDER — CELECOXIB 200 MG PO CAPS: 200.0000 mg | ORAL_CAPSULE | Freq: Once | ORAL | Status: AC

## 2020-01-27 MED ORDER — EPHEDRINE SULFATE-NACL 50-0.9 MG/10ML-% IV SOSY
PREFILLED_SYRINGE | INTRAVENOUS | Status: DC | PRN
  Administered 2020-01-27 (×4): 10 mg via INTRAVENOUS
  Administered 2020-01-27 (×2): 15 mg via INTRAVENOUS

## 2020-01-27 MED ORDER — ONDANSETRON HCL 4 MG PO TABS: 4.0000 mg | ORAL_TABLET | Freq: Three times a day (TID) | ORAL | 0 refills | Status: DC | PRN

## 2020-01-27 MED ORDER — FENTANYL CITRATE (PF) 100 MCG/2ML IJ SOLN
INTRAMUSCULAR | Status: AC
  Filled 2020-01-27: qty 2

## 2020-01-27 MED ORDER — KETOROLAC TROMETHAMINE 15 MG/ML IJ SOLN
7.5000 mg | Freq: Four times a day (QID) | INTRAMUSCULAR | Status: AC
  Administered 2020-01-27 – 2020-01-28 (×4): 7.5 mg via INTRAVENOUS
  Filled 2020-01-27 (×4): qty 1

## 2020-01-27 MED ORDER — TRANEXAMIC ACID-NACL 1000-0.7 MG/100ML-% IV SOLN
INTRAVENOUS | Status: DC | PRN
  Administered 2020-01-27: 1000 mg via INTRAVENOUS

## 2020-01-27 MED ORDER — ONDANSETRON HCL 4 MG/2ML IJ SOLN: 4.0000 mg | Freq: Four times a day (QID) | INTRAMUSCULAR | Status: DC | PRN

## 2020-01-27 MED ORDER — DOCUSATE SODIUM 100 MG PO CAPS: 100.0000 mg | ORAL_CAPSULE | Freq: Two times a day (BID) | ORAL | 0 refills | Status: DC

## 2020-01-27 MED ORDER — CHLORHEXIDINE GLUCONATE 4 % EX LIQD: 60.0000 mL | Freq: Once | CUTANEOUS | Status: DC

## 2020-01-27 MED ORDER — BUPIVACAINE IN DEXTROSE 0.75-8.25 % IT SOLN
INTRATHECAL | Status: DC | PRN
  Administered 2020-01-27: 1.6 mL via INTRATHECAL

## 2020-01-27 MED ORDER — DIAZEPAM 5 MG PO TABS: 5.0000 mg | ORAL_TABLET | Freq: Two times a day (BID) | ORAL | Status: DC | PRN

## 2020-01-27 MED ORDER — LACTATED RINGERS IV SOLN: INTRAVENOUS | Status: DC

## 2020-01-27 MED ORDER — PROPOFOL 500 MG/50ML IV EMUL
INTRAVENOUS | Status: DC | PRN
  Administered 2020-01-27: 100 ug/kg/min via INTRAVENOUS

## 2020-01-27 MED ORDER — ROPIVACAINE HCL 7.5 MG/ML IJ SOLN
INTRAMUSCULAR | Status: DC | PRN
  Administered 2020-01-27: 20 mL via PERINEURAL

## 2020-01-27 MED ORDER — CELECOXIB 200 MG PO CAPS
ORAL_CAPSULE | ORAL | Status: AC
  Administered 2020-01-27: 06:00:00 200 mg via ORAL
  Filled 2020-01-27: qty 1

## 2020-01-27 MED ORDER — METOCLOPRAMIDE HCL 5 MG/ML IJ SOLN: 5.0000 mg | Freq: Three times a day (TID) | INTRAMUSCULAR | Status: DC | PRN

## 2020-01-27 MED ORDER — PHENOL 1.4 % MT LIQD: 1.0000 | OROMUCOSAL | Status: DC | PRN

## 2020-01-27 MED ORDER — MENTHOL 3 MG MT LOZG: 1.0000 | LOZENGE | OROMUCOSAL | Status: DC | PRN

## 2020-01-27 MED ORDER — POVIDONE-IODINE 10 % EX SWAB
2.0000 "application " | Freq: Once | CUTANEOUS | Status: AC
  Administered 2020-01-27: 2 via TOPICAL

## 2020-01-27 MED ORDER — PROMETHAZINE HCL 25 MG/ML IJ SOLN: 6.2500 mg | INTRAMUSCULAR | Status: DC | PRN

## 2020-01-27 MED ORDER — FENTANYL CITRATE (PF) 100 MCG/2ML IJ SOLN
INTRAMUSCULAR | Status: DC | PRN
  Administered 2020-01-27 (×2): 50 ug via INTRAVENOUS

## 2020-01-27 MED ORDER — CEFAZOLIN SODIUM-DEXTROSE 1-4 GM/50ML-% IV SOLN
1.0000 g | Freq: Four times a day (QID) | INTRAVENOUS | Status: AC
  Administered 2020-01-27 (×2): 1 g via INTRAVENOUS
  Filled 2020-01-27 (×2): qty 50

## 2020-01-27 MED ORDER — PROPOFOL 10 MG/ML IV BOLUS
INTRAVENOUS | Status: DC | PRN
  Administered 2020-01-27: 20 mg via INTRAVENOUS

## 2020-01-27 MED ORDER — PANTOPRAZOLE SODIUM 40 MG PO TBEC
40.0000 mg | DELAYED_RELEASE_TABLET | Freq: Every day | ORAL | Status: DC
  Administered 2020-01-27 – 2020-01-28 (×2): 40 mg via ORAL
  Filled 2020-01-27 (×2): qty 1

## 2020-01-27 MED ORDER — DOCUSATE SODIUM 100 MG PO CAPS
100.0000 mg | ORAL_CAPSULE | Freq: Two times a day (BID) | ORAL | Status: DC
  Administered 2020-01-27 – 2020-01-28 (×2): 100 mg via ORAL
  Filled 2020-01-27 (×2): qty 1

## 2020-01-27 MED ORDER — POTASSIUM CHLORIDE CRYS ER 20 MEQ PO TBCR
20.0000 meq | EXTENDED_RELEASE_TABLET | Freq: Two times a day (BID) | ORAL | Status: DC
  Administered 2020-01-27 – 2020-01-28 (×2): 20 meq via ORAL
  Filled 2020-01-27 (×2): qty 1

## 2020-01-27 MED ORDER — PROPOFOL 1000 MG/100ML IV EMUL
INTRAVENOUS | Status: AC
  Filled 2020-01-27: qty 100

## 2020-01-27 MED ORDER — FUROSEMIDE 40 MG PO TABS
40.0000 mg | ORAL_TABLET | Freq: Every day | ORAL | Status: DC
  Administered 2020-01-27 – 2020-01-28 (×2): 40 mg via ORAL
  Filled 2020-01-27 (×2): qty 1

## 2020-01-27 MED ORDER — SODIUM CHLORIDE 0.9% FLUSH
INTRAVENOUS | Status: DC | PRN
  Administered 2020-01-27: 30 mL

## 2020-01-27 MED ORDER — PHENYLEPHRINE 40 MCG/ML (10ML) SYRINGE FOR IV PUSH (FOR BLOOD PRESSURE SUPPORT)
PREFILLED_SYRINGE | INTRAVENOUS | Status: AC
  Filled 2020-01-27: qty 10

## 2020-01-27 MED ORDER — ONDANSETRON HCL 4 MG PO TABS: 4.0000 mg | ORAL_TABLET | Freq: Four times a day (QID) | ORAL | Status: DC | PRN

## 2020-01-27 MED ORDER — GABAPENTIN 300 MG PO CAPS: 300.0000 mg | ORAL_CAPSULE | Freq: Three times a day (TID) | ORAL | 0 refills | Status: DC | PRN

## 2020-01-27 MED ORDER — OXYCODONE HCL 5 MG PO TABS: 5.0000 mg | ORAL_TABLET | ORAL | 0 refills | Status: AC | PRN

## 2020-01-27 MED ORDER — LIDOCAINE 2% (20 MG/ML) 5 ML SYRINGE
INTRAMUSCULAR | Status: AC
  Filled 2020-01-27: qty 5

## 2020-01-27 MED ORDER — POLYETHYLENE GLYCOL 3350 17 G PO PACK: 17.0000 g | PACK | Freq: Every day | ORAL | Status: DC | PRN

## 2020-01-27 MED ORDER — HYDROMORPHONE HCL 1 MG/ML IJ SOLN
0.5000 mg | INTRAMUSCULAR | Status: DC | PRN
  Administered 2020-01-27 – 2020-01-28 (×2): 1 mg via INTRAVENOUS
  Filled 2020-01-27 (×2): qty 1

## 2020-01-27 MED ORDER — PROPOFOL 10 MG/ML IV BOLUS
INTRAVENOUS | Status: AC
  Filled 2020-01-27: qty 20

## 2020-01-27 MED ORDER — MAGNESIUM CITRATE PO SOLN: 1.0000 | Freq: Once | ORAL | Status: DC | PRN

## 2020-01-27 MED ORDER — RIVAROXABAN 10 MG PO TABS
20.0000 mg | ORAL_TABLET | Freq: Every day | ORAL | Status: DC
  Administered 2020-01-28: 20 mg via ORAL
  Filled 2020-01-27: qty 2

## 2020-01-27 MED ORDER — GABAPENTIN 300 MG PO CAPS
300.0000 mg | ORAL_CAPSULE | Freq: Three times a day (TID) | ORAL | Status: DC
  Administered 2020-01-27 – 2020-01-28 (×3): 300 mg via ORAL
  Filled 2020-01-27 (×3): qty 1

## 2020-01-27 SURGICAL SUPPLY — 52 items
BLADE HEX COATED 2.75 (ELECTRODE) ×3 IMPLANT
BLADE SAG 18X100X1.27 (BLADE) ×3 IMPLANT
BLADE SAGITTAL 25.0X1.37X90 (BLADE) ×2 IMPLANT
BLADE SAGITTAL 25.0X1.37X90MM (BLADE) ×1
BLADE SURG 15 STRL LF DISP TIS (BLADE) ×1 IMPLANT
BLADE SURG 15 STRL SS (BLADE) ×3
BLADE SURG SZ10 CARB STEEL (BLADE) ×6 IMPLANT
BNDG CMPR MED 10X6 ELC LF (GAUZE/BANDAGES/DRESSINGS) ×1
BNDG ELASTIC 6X10 VLCR STRL LF (GAUZE/BANDAGES/DRESSINGS) ×3 IMPLANT
BOWL SMART MIX CTS (DISPOSABLE) IMPLANT
BSPLAT TIB 3 KN TRITANIUM (Knees) ×1 IMPLANT
CLOSURE STERI-STRIP 1/2X4 (GAUZE/BANDAGES/DRESSINGS) ×1
CLSR STERI-STRIP ANTIMIC 1/2X4 (GAUZE/BANDAGES/DRESSINGS) ×2 IMPLANT
COMP FEMORAL TRIATHLON SZ3 (Joint) ×3 IMPLANT
COMPONENT FEMRL TRIATHLON SZ3 (Joint) IMPLANT
COVER SURGICAL LIGHT HANDLE (MISCELLANEOUS) ×3 IMPLANT
COVER WAND RF STERILE (DRAPES) IMPLANT
CUFF TOURN SGL QUICK 34 (TOURNIQUET CUFF) ×3
CUFF TRNQT CYL 34X4.125X (TOURNIQUET CUFF) ×1 IMPLANT
DECANTER SPIKE VIAL GLASS SM (MISCELLANEOUS) ×3 IMPLANT
DRAPE U-SHAPE 47X51 STRL (DRAPES) ×3 IMPLANT
DRSG MEPILEX BORDER 4X12 (GAUZE/BANDAGES/DRESSINGS) ×3 IMPLANT
DURAPREP 26ML APPLICATOR (WOUND CARE) ×6 IMPLANT
GLOVE BIO SURGEON STRL SZ7.5 (GLOVE) ×6 IMPLANT
GLOVE BIOGEL PI IND STRL 8 (GLOVE) ×2 IMPLANT
GLOVE BIOGEL PI INDICATOR 8 (GLOVE) ×4
GOWN STRL REUS W/ TWL LRG LVL3 (GOWN DISPOSABLE) ×2 IMPLANT
GOWN STRL REUS W/TWL LRG LVL3 (GOWN DISPOSABLE) ×6
HANDPIECE INTERPULSE COAX TIP (DISPOSABLE) ×3
HOLDER FOLEY CATH W/STRAP (MISCELLANEOUS) IMPLANT
IMMOBILIZER KNEE 22 UNIV (SOFTGOODS) ×3 IMPLANT
INSERT TIB BEARING SZ3 11 (Miscellaneous) ×2 IMPLANT
KIT TURNOVER KIT A (KITS) IMPLANT
KNEE PATELLA ASYMMETRIC 9X29 (Knees) ×2 IMPLANT
KNEE TIBIAL COMPONENT SZ3 (Knees) ×2 IMPLANT
MANIFOLD NEPTUNE II (INSTRUMENTS) ×3 IMPLANT
NS IRRIG 1000ML POUR BTL (IV SOLUTION) ×3 IMPLANT
PACK ICE MAXI GEL EZY WRAP (MISCELLANEOUS) ×3 IMPLANT
PACK TOTAL KNEE CUSTOM (KITS) ×3 IMPLANT
PENCIL SMOKE EVACUATOR (MISCELLANEOUS) IMPLANT
PIN FLUTED HEDLESS FIX 3.5X1/8 (PIN) ×2 IMPLANT
PROTECTOR NERVE ULNAR (MISCELLANEOUS) ×3 IMPLANT
SET HNDPC FAN SPRY TIP SCT (DISPOSABLE) ×1 IMPLANT
SUT MNCRL AB 4-0 PS2 18 (SUTURE) ×3 IMPLANT
SUT VIC AB 0 CT1 36 (SUTURE) ×3 IMPLANT
SUT VIC AB 1 CT1 36 (SUTURE) ×6 IMPLANT
SUT VIC AB 2-0 CT1 27 (SUTURE) ×6
SUT VIC AB 2-0 CT1 TAPERPNT 27 (SUTURE) ×1 IMPLANT
TRAY FOLEY MTR SLVR 14FR STAT (SET/KITS/TRAYS/PACK) IMPLANT
TRAY FOLEY MTR SLVR 16FR STAT (SET/KITS/TRAYS/PACK) IMPLANT
WRAP KNEE MAXI GEL POST OP (GAUZE/BANDAGES/DRESSINGS) ×2 IMPLANT
YANKAUER SUCT BULB TIP 10FT TU (MISCELLANEOUS) ×3 IMPLANT

## 2020-01-27 NOTE — Anesthesia Postprocedure Evaluation (Signed)
Anesthesia Post Note  Patient: Katie [name]  Procedure(s) Performed: TOTAL KNEE ARTHROPLASTY (Left Knee)     Patient location during evaluation: PACU Anesthesia Type: Spinal Level of consciousness: awake and alert Pain management: pain level controlled Vital Signs Assessment: post-procedure vital signs reviewed and stable Respiratory status: spontaneous breathing and respiratory function stable Cardiovascular status: blood pressure returned to baseline and stable Postop Assessment: spinal receding Anesthetic complications: no    Last Vitals:  Vitals:   01/27/20 1015 01/27/20 1030  BP: 129/78 124/83  Pulse: (!) 57 65  Resp: 19 13  Temp:  (!) 36.4 C  SpO2: 100% 100%    Last Pain:  Vitals:   01/27/20 1030  TempSrc:   PainSc: 0-No pain    LLE Motor Response: Purposeful movement (01/27/20 1030)   RLE Motor Response: Purposeful movement (01/27/20 1030)   L Sensory Level: L4-Anterior knee, lower leg (01/27/20 1030) R Sensory Level: L4-Anterior knee, lower leg (01/27/20 1030)  Ethanael Veith DANIEL

## 2020-01-27 NOTE — Discharge Instructions (Signed)
You may bear weight as tolerated. Keep your dressing on and dry until follow up. Take medicine to prevent blood clots as directed. Take pain medicine as needed with the goal of transitioning to over the counter medicines.    If you are able to take Tylenol you make take up to 1000 mg three times per day. If needed, you may increase breakthrough pain medication (oxycodone) for the first few days post op - up to 2 tablets every 4 hours.  Stop this medication as soon as you are able.  INSTRUCTIONS AFTER JOINT REPLACEMENT   o Remove items at home which could result in a fall. This includes throw rugs or furniture in walking pathways o ICE to the affected joint every three hours while awake for 30 minutes at a time, for at least the first 3-5 days, and then as needed for pain and swelling.  Continue to use ice for pain and swelling. You may notice swelling that will progress down to the foot and ankle.  This is normal after surgery.  Elevate your leg when you are not up walking on it.   o Continue to use the breathing machine you got in the hospital (incentive spirometer) which will help keep your temperature down.  It is common for your temperature to cycle up and down following surgery, especially at night when you are not up moving around and exerting yourself.  The breathing machine keeps your lungs expanded and your temperature down.   DIET:  As you were doing prior to hospitalization, we recommend a well-balanced diet.  DRESSING / WOUND CARE / SHOWERING  You may shower 3 days after surgery, but keep the wounds dry during showering.  You may use an occlusive plastic wrap (Press'n Seal for example) with blue painter's tape at edges, NO SOAKING/SUBMERGING IN THE BATHTUB.  If the bandage gets wet, change with a clean dry gauze.  If the incision gets wet, pat the wound dry with a clean towel.  ACTIVITY  o Increase activity slowly as tolerated, but follow the weight bearing instructions below.    o No driving for 6 weeks or until further direction given by your physician.  You cannot drive while taking narcotics.  o No lifting or carrying greater than 10 lbs. until further directed by your surgeon. o Avoid periods of inactivity such as sitting longer than an hour when not asleep. This helps prevent blood clots.  o You may return to work once you are authorized by your doctor.     WEIGHT BEARING   Weight bearing as tolerated with assist device (walker, cane, etc) as directed, use it as long as suggested by your surgeon or therapist, typically at least 4-6 weeks.   EXERCISES  Results after joint replacement surgery are often greatly improved when you follow the exercise, range of motion and muscle strengthening exercises prescribed by your doctor. Safety measures are also important to protect the joint from further injury. Any time any of these exercises cause you to have increased pain or swelling, decrease what you are doing until you are comfortable again and then slowly increase them. If you have problems or questions, call your caregiver or physical therapist for advice.   Rehabilitation is important following a joint replacement. After just a few days of immobilization, the muscles of the leg can become weakened and shrink (atrophy).  These exercises are designed to build up the tone and strength of the thigh and leg muscles and to improve motion.  Often times heat used for twenty to thirty minutes before working out will loosen up your tissues and help with improving the range of motion but do not use heat for the first two weeks following surgery (sometimes heat can increase post-operative swelling).   These exercises can be done on a training (exercise) mat, on the floor, on a table or on a bed. Use whatever works the best and is most comfortable for you.    Use music or television while you are exercising so that the exercises are a pleasant break in your day. This will make your  life better with the exercises acting as a break in your routine that you can look forward to.   Perform all exercises about fifteen times, three times per day or as directed.  You should exercise both the operative leg and the other leg as well.  Exercises include:   . Quad Sets - Tighten up the muscle on the front of the thigh (Quad) and hold for 5-10 seconds.   . Straight Leg Raises - With your knee straight (if you were given a brace, keep it on), lift the leg to 60 degrees, hold for 3 seconds, and slowly lower the leg.  Perform this exercise against resistance later as your leg gets stronger.  . Leg Slides: Lying on your back, slowly slide your foot toward your buttocks, bending your knee up off the floor (only go as far as is comfortable). Then slowly slide your foot back down until your leg is flat on the floor again.  Katie [name] Wings: Lying on your back spread your legs to the side as far apart as you can without causing discomfort.  . Hamstring Strength:  Lying on your back, push your heel against the floor with your leg straight by tightening up the muscles of your buttocks.  Repeat, but this time bend your knee to a comfortable angle, and push your heel against the floor.  You may put a pillow under the heel to make it more comfortable if necessary.   A rehabilitation program following joint replacement surgery can speed recovery and prevent re-injury in the future due to weakened muscles. Contact your doctor or a physical therapist for more information on knee rehabilitation.    CONSTIPATION  Constipation is defined medically as fewer than three stools per week and severe constipation as less than one stool per week.  Even if you have a regular bowel pattern at home, your normal regimen is likely to be disrupted due to multiple reasons following surgery.  Combination of anesthesia, postoperative narcotics, change in appetite and fluid intake all can affect your bowels.   YOU MUST use at  least one of the following options; they are listed in order of increasing strength to get the job done.  They are all available over the counter, and you may need to use some, POSSIBLY even all of these options:    Drink plenty of fluids (prune juice may be helpful) and high fiber foods Colace 100 mg by mouth twice a day  Senokot for constipation as directed and as needed Dulcolax (bisacodyl), take with full glass of water  Miralax (polyethylene glycol) once or twice a day as needed.  If you have tried all these things and are unable to have a bowel movement in the first 3-4 days after surgery call either your surgeon or your primary doctor.    If you experience loose stools or diarrhea, hold the medications until you  stool forms back up.  If your symptoms do not get better within 1 week or if they get worse, check with your doctor.  If you experience "the worst abdominal pain ever" or develop nausea or vomiting, please contact the office immediately for further recommendations for treatment.   ITCHING:  If you experience itching with your medications, try taking only a single pain pill, or even half a pain pill at a time.  You can also use Benadryl over the counter for itching or also to help with sleep.   TED HOSE STOCKINGS:  Use stockings on both legs until for at least 2 weeks or as directed by physician office. They may be removed at night for sleeping.  MEDICATIONS:  See your medication summary on the "After Visit Summary" that nursing will review with you.  You may have some home medications which will be placed on hold until you complete the course of blood thinner medication.  It is important for you to complete the blood thinner medication as prescribed.  PRECAUTIONS:  If you experience chest pain or shortness of breath - call 911 immediately for transfer to the hospital emergency department.   If you develop a fever greater that 101 F, purulent drainage from wound, increased redness  or drainage from wound, foul odor from the wound/dressing, or calf pain - CONTACT YOUR SURGEON.                                                   FOLLOW-UP APPOINTMENTS:  If you do not already have a post-op appointment, please call the office for an appointment to be seen by your surgeon.  Guidelines for how soon to be seen are listed in your "After Visit Summary", but are typically between 1-4 weeks after surgery.  OTHER INSTRUCTIONS:     MAKE SURE YOU:  . Understand these instructions.  . Get help right away if you are not doing well or get worse.    Thank you for letting us be a part of your medical care team.  It is a privilege we respect greatly.  We hope these instructions will help you stay on track for a fast and full recovery!

## 2020-01-27 NOTE — Anesthesia Procedure Notes (Signed)
Anesthesia Regional Block: Adductor canal block   Pre-Anesthetic Checklist: ,, timeout performed, Correct Patient, Correct Site, Correct Laterality, Correct Procedure, Correct Position, site marked, Risks and benefits discussed,  Surgical consent,  Pre-op evaluation,  At surgeon's request and post-op pain management  Laterality: Right  Prep: chloraprep       Needles:  Injection technique: Single-shot  Needle Type: Stimulator Needle - 80     Needle Length: 10cm  Needle Gauge: 21     Additional Needles:   Narrative:  Start time: 01/27/2020 6:51 AM End time: 01/27/2020 7:59 AM Injection made incrementally with aspirations every 5 mL.  Performed by: Personally

## 2020-01-27 NOTE — Transfer of Care (Signed)
Immediate Anesthesia Transfer of Care Note  Patient: Katie [name]  Procedure(s) Performed: TOTAL KNEE ARTHROPLASTY (Left Knee)  Patient Location: PACU  Anesthesia Type:Spinal  Level of Consciousness: awake and alert   Airway & Oxygen Therapy: Patient Spontanous Breathing and Patient connected to face mask oxygen  Post-op Assessment: Report given to RN and Post -op Vital signs reviewed and stable  Post vital signs: Reviewed and stable  Last Vitals:  Vitals Value Taken Time  BP 108/72 01/27/20 0932  Temp    Pulse 67 01/27/20 0933  Resp 22 01/27/20 0933  SpO2 76 % 01/27/20 0933  Vitals shown include unvalidated device data.  Last Pain:  Vitals:   01/27/20 0617  TempSrc: Oral         Complications: No apparent anesthesia complications

## 2020-01-27 NOTE — Anesthesia Procedure Notes (Signed)
Spinal  Patient location during procedure: OR Start time: 01/27/2020 7:31 AM End time: 01/27/2020 7:37 AM Staffing Performed: resident/CRNA  Anesthesiologist: Heather Roberts, MD Resident/CRNA: Jhonnie Garner, CRNA Preanesthetic Checklist Completed: patient identified, IV checked, site marked, risks and benefits discussed, surgical consent, monitors and equipment checked, pre-op evaluation and timeout performed Spinal Block Patient position: sitting Prep: DuraPrep Patient monitoring: heart rate, cardiac monitor, continuous pulse ox and blood pressure Approach: midline Location: L3-4 Injection technique: single-shot Needle Needle type: Pencan  Needle gauge: 24 G Needle length: 9 cm Assessment Sensory level: T4 Additional Notes Clear CSF, no paresthesia, patient tolerated well.

## 2020-01-27 NOTE — Evaluation (Signed)
Physical Therapy Evaluation Patient Details Name: Katie [name] MRN: [id number] DOB: [birth year] Today's Date: 01/27/2020   History of Present Illness  L TKA on 01/27/20. PMH of anxiety/depression, HTN, PE  Clinical Impression  Pt is s/p TKA resulting in the deficits listed below (see PT Problem List). Pt ambulated 64' with RW. Initiated TKA HEP. Good progress expected.  Pt will benefit from skilled PT to increase their independence and safety with mobility to allow discharge to the venue listed below.      Follow Up Recommendations Home health PT;Follow surgeon's recommendation for DC plan and follow-up therapies    Equipment Recommendations  None recommended by PT    Recommendations for Other Services       Precautions / Restrictions Precautions Precautions: Knee;Fall Precaution Comments: 3 falls in past 1 year, L knee gave out Required Braces or Orthoses: Knee Immobilizer - Left Knee Immobilizer - Left: Discontinue once straight leg raise with < 10 degree lag Restrictions Weight Bearing Restrictions: No Other Position/Activity Restrictions: WBAT      Mobility  Bed Mobility Overal bed mobility: Needs Assistance Bed Mobility: Supine to Sit     Supine to sit: Min assist     General bed mobility comments: min A for LLE  Transfers Overall transfer level: Needs assistance Equipment used: Rolling walker (2 wheeled) Transfers: Sit to/from Stand Sit to Stand: Min assist;From elevated surface         General transfer comment: VCs hand placement, min A to rise  Ambulation/Gait Ambulation/Gait assistance: Min guard Gait Distance (Feet): 45 Feet Assistive device: Rolling walker (2 wheeled) Gait Pattern/deviations: Step-to pattern;Decreased step length - right;Decreased step length - left;Narrow base of support Gait velocity: decr   General Gait Details: VCs sequencing and positioning in RW  Stairs            Wheelchair Mobility    Modified Rankin (Stroke  Patients Only)       Balance Overall balance assessment: Modified Independent                                           Pertinent Vitals/Pain Pain Assessment: 0-10 Pain Score: 9  Pain Location: L knee Pain Descriptors / Indicators: Aching Pain Intervention(s): Limited activity within patient's tolerance;Monitored during session;Premedicated before session;Ice applied;Patient requesting pain meds-RN notified;RN gave pain meds during session    Home Living Family/patient expects to be discharged to:: Private residence Living Arrangements: Spouse/significant other;Children Available Help at Discharge: Family   Home Access: Stairs to enter Entrance Stairs-Rails: Left Entrance Stairs-Number of Steps: 5 Home Layout: One level Home Equipment: Environmental consultant - 2 wheels;Cane - single point;Bedside commode Additional Comments: aide 3*/day 7x/week, daughter and husband to assist as well    Prior Function Level of Independence: Independent with assistive device(s)         Comments: walked with cane     Hand Dominance        Extremity/Trunk Assessment   Upper Extremity Assessment Upper Extremity Assessment: Overall WFL for tasks assessed    Lower Extremity Assessment Lower Extremity Assessment: LLE deficits/detail LLE Deficits / Details: 5-45* AAROM L knee, SLR +2/5 LLE Sensation: WNL LLE Coordination: WNL    Cervical / Trunk Assessment Cervical / Trunk Assessment: Normal  Communication   Communication: No difficulties  Cognition Arousal/Alertness: Awake/alert Behavior During Therapy: WFL for tasks assessed/performed Overall Cognitive Status: Within Functional Limits for tasks  assessed                                        General Comments      Exercises Total Joint Exercises Ankle Circles/Pumps: AROM;Both;10 reps;Supine Quad Sets: AROM;Left;5 reps;Supine Goniometric ROM: 5-45* L knee AAROM   Assessment/Plan    PT Assessment  Patient needs continued PT services  PT Problem List Decreased strength;Decreased range of motion;Decreased activity tolerance;Decreased balance;Decreased mobility;Pain       PT Treatment Interventions Gait training;Functional mobility training;Stair training;Therapeutic exercise;Therapeutic activities;DME instruction;Patient/family education    PT Goals (Current goals can be found in the Care Plan section)  Acute Rehab PT Goals Patient Stated Goal: work out at [employer] PT Goal Formulation: With patient Time For Goal Achievement: 02/03/20 Potential to Achieve Goals: Good    Frequency 7X/week   Barriers to discharge        Co-evaluation               AM-PAC PT "6 Clicks" Mobility  Outcome Measure Help needed turning from your back to your side while in a flat bed without using bedrails?: A Little Help needed moving from lying on your back to sitting on the side of a flat bed without using bedrails?: A Little Help needed moving to and from a bed to a chair (including a wheelchair)?: A Little Help needed standing up from a chair using your arms (e.g., wheelchair or bedside chair)?: A Little Help needed to walk in hospital room?: A Little Help needed climbing 3-5 steps with a railing? : A Lot 6 Click Score: 17    End of Session Equipment Utilized During Treatment: Gait belt Activity Tolerance: Patient tolerated treatment well Patient left: in chair;with call bell/phone within reach;with chair alarm set Nurse Communication: Mobility status PT Visit Diagnosis: Muscle weakness (generalized) (M62.81);Difficulty in walking, not elsewhere classified (R26.2)    Time: 1340-1413 PT Time Calculation (min) (ACUTE ONLY): 33 min   Charges:   PT Evaluation $PT Eval Low Complexity: 1 Low PT Treatments $Gait Training: 8-22 mins        Blondell Reveal Kistler PT 01/27/2020  Acute Rehabilitation Services Pager [phone] Office [phone]

## 2020-01-27 NOTE — Op Note (Signed)
DATE OF SURGERY:  01/27/2020 TIME: 11:15 AM  PATIENT NAME:  Katie [name]   AGE: 54 y.o.    PRE-OPERATIVE DIAGNOSIS:  OA LEFT KNEE  POST-OPERATIVE DIAGNOSIS:  Same  PROCEDURE:  Procedure(s): TOTAL KNEE ARTHROPLASTY   SURGEON:  [name], MD   ASSISTANT:  Estanislado Spire, PA-C, he was present and scrubbed throughout the case, critical for completion in a timely fashion, and for retraction, instrumentation, and closure.    OPERATIVE IMPLANTS: [employer] Triathlon CR. Press fit knee  Femur size 3, Tibia size 3, Patella size 29 3-peg oval button, with a 11 mm polyethylene insert.   PREOPERATIVE INDICATIONS:  Katie [name] is a 54 y.o. year old female with end stage bone on bone degenerative arthritis of the knee who failed conservative treatment, including injections, antiinflammatories, activity modification, and assistive devices, and had significant impairment of their activities of daily living, and elected for Total Knee Arthroplasty.   The risks, benefits, and alternatives were discussed at length including but not limited to the risks of infection, bleeding, nerve injury, stiffness, blood clots, the need for revision surgery, cardiopulmonary complications, among others, and they were willing to proceed.   OPERATIVE DESCRIPTION:  The patient was brought to the operative room and placed in a supine position.  General anesthesia was administered.  IV antibiotics were given.  The lower extremity was prepped and draped in the usual sterile fashion.  Time out was performed.  The leg was elevated and exsanguinated and the tourniquet was inflated.  Anterior approach was performed.  The patella was everted and osteophytes were removed.  The anterior horn of the medial and lateral meniscus was removed.   The distal femur was opened with the drill and the intramedullary distal femoral cutting jig was utilized, set at 5 degrees resecting 9 mm off the distal femur.  Care was taken to  protect the collateral ligaments.  The distal femoral sizing jig was applied, taking care to avoid notching.  Then the 4-in-1 cutting jig was applied and the anterior and posterior femur was cut, along with the chamfer cuts.  All posterior osteophytes were removed.  The flexion gap was then measured and was symmetric with the extension gap.  Then the extramedullary tibial cutting jig was utilized making the appropriate cut using the anterior tibial crest as a reference building in appropriate posterior slope.  Care was taken during the cut to protect the medial and collateral ligaments.  The proximal tibia was removed along with the posterior horns of the menisci.  The PCL was sacrificed.    The extensor gap was measured and was approximately 29mm.    I completed the distal femoral preparation using the appropriate jig to prepare the box.  The patella was then measured, and cut with the saw.    The proximal tibia sized and prepared accordingly with the reamer and the punch, and then all components were trialed with the above sized poly insert.  The knee was found to have excellent balance and full motion.    The above named components were then impacted into place and Poly tibial piece and patella were inserted.  I was very happy with his stability and ROM  I performed a periarticular injection with marcaine and toradol  The knee was easily taken through a range of motion and the patella tracked well and the knee irrigated copiously and the parapatellar and subcutaneous tissue closed with vicryl, and monocryl with steri strips for the skin.  The  incision was dressed with sterile gauze and the tourniquet released and the patient was awakened and returned to the PACU in stable and satisfactory condition.  There were no complications.  Total tourniquet time was roughly 60 minutes.   POSTOPERATIVE PLAN: post op Abx, DVT px: SCD's, TED's, Early ambulation and chemical px

## 2020-01-27 NOTE — Anesthesia Procedure Notes (Signed)
Date/Time: 01/27/2020 7:38 AM Performed by: Jhonnie Garner, CRNA Oxygen Delivery Method: Simple face mask

## 2020-01-27 NOTE — Interval H&P Note (Signed)
I participated in the care of this patient and agree with the above history, physical and evaluation. I performed a review of the history and a physical exam as detailed   Auguste Tebbetts Daniel Rosena Bartle MD  

## 2020-01-28 DIAGNOSIS — M1712 Unilateral primary osteoarthritis, left knee: Secondary | ICD-10-CM | POA: Diagnosis not present

## 2020-01-28 NOTE — Progress Notes (Signed)
Physical Therapy Treatment Patient Details Name: Katie [name] MRN: [id number] DOB: 1966-04-21 Today's Date: 01/28/2020    History of Present Illness L TKA on 01/27/20. PMH of anxiety/depression, HTN, PE    PT Comments    Pt is progressing well with mobility, she ambulated 140' with RW, performed TKA HEP with min assist, and completed stair training. She would like to review HEP after lunch so will plan one more PT session. After this, I expect she'll be ready to DC home from PT standpoint.    Follow Up Recommendations  Home health PT;Follow surgeon's recommendation for DC plan and follow-up therapies     Equipment Recommendations  None recommended by PT    Recommendations for Other Services       Precautions / Restrictions Precautions Precautions: Knee;Fall Precaution Comments: 3 falls in past 1 year, L knee gave out Required Braces or Orthoses: Knee Immobilizer - Left Knee Immobilizer - Left: Discontinue once straight leg raise with < 10 degree lag Restrictions Weight Bearing Restrictions: No Other Position/Activity Restrictions: WBAT    Mobility  Bed Mobility Overal bed mobility: Needs Assistance Bed Mobility: Supine to Sit     Supine to sit: Min guard     General bed mobility comments: instructed pt to self assist LLE with RLE  Transfers Overall transfer level: Needs assistance Equipment used: Rolling walker (2 wheeled) Transfers: Sit to/from Stand Sit to Stand: From elevated surface;Min guard         General transfer comment: VCs hand placement  Ambulation/Gait Ambulation/Gait assistance: Min guard Gait Distance (Feet): 140 Feet Assistive device: Rolling walker (2 wheeled) Gait Pattern/deviations: Step-to pattern;Decreased step length - right;Decreased step length - left;Narrow base of support Gait velocity: decr   General Gait Details: VCs sequencing and positioning in RW, VCs for L heel strike   Stairs Stairs: Yes Stairs assistance: Min  guard Stair Management: Two rails;Step to pattern;Forwards Number of Stairs: 5 General stair comments: 3 stairs + 2 stairs with B rails, VCs sequencing   Wheelchair Mobility    Modified Rankin (Stroke Patients Only)       Balance Overall balance assessment: Modified Independent                                          Cognition Arousal/Alertness: Awake/alert Behavior During Therapy: WFL for tasks assessed/performed Overall Cognitive Status: Within Functional Limits for tasks assessed                                        Exercises Total Joint Exercises Ankle Circles/Pumps: AROM;Both;10 reps;Supine Quad Sets: AROM;Left;Supine;10 reps Short Arc Quad: AROM;Left;10 reps;Supine Heel Slides: AAROM;Left;10 reps;Supine Hip ABduction/ADduction: AAROM;Left;10 reps;Supine Straight Leg Raises: AAROM;Left;10 reps;Supine Long Arc Quad: AROM;Left;10 reps;Seated Knee Flexion: AAROM;Left;5 reps;Seated Goniometric ROM: 5-55* AAROM L knee    General Comments        Pertinent Vitals/Pain Pain Score: 5  Pain Location: L knee Pain Descriptors / Indicators: Sore Pain Intervention(s): Limited activity within patient's tolerance;Monitored during session;Premedicated before session;Ice applied    Home Living                      Prior Function            PT Goals (current goals can now be found in the  care plan section) Acute Rehab PT Goals Patient Stated Goal: work out at [employer] PT Goal Formulation: With patient Time For Goal Achievement: 02/03/20 Potential to Achieve Goals: Good Progress towards PT goals: Progressing toward goals    Frequency    7X/week      PT Plan      Co-evaluation              AM-PAC PT "6 Clicks" Mobility   Outcome Measure  Help needed turning from your back to your side while in a flat bed without using bedrails?: A Little Help needed moving from lying on your back to sitting on the side of a  flat bed without using bedrails?: A Little Help needed moving to and from a bed to a chair (including a wheelchair)?: A Little Help needed standing up from a chair using your arms (e.g., wheelchair or bedside chair)?: A Little Help needed to walk in hospital room?: A Little Help needed climbing 3-5 steps with a railing? : A Little 6 Click Score: 18    End of Session Equipment Utilized During Treatment: Gait belt Activity Tolerance: Patient tolerated treatment well Patient left: in chair;with call bell/phone within reach;with chair alarm set Nurse Communication: Mobility status PT Visit Diagnosis: Muscle weakness (generalized) (M62.81);Difficulty in walking, not elsewhere classified (R26.2)     Time: 3491-7915 PT Time Calculation (min) (ACUTE ONLY): 56 min  Charges:  $Gait Training: 23-37 mins $Therapeutic Exercise: 8-22 mins $Therapeutic Activity: 8-22 mins                     Blondell Reveal Kistler PT 01/28/2020  Acute Rehabilitation Services Pager [phone] Office [phone]

## 2020-01-28 NOTE — Progress Notes (Signed)
Subjective: Patient reports pain as mild, controlled.  Tolerating diet.  Knee felt "surprisingly good and stable" when mobilizing with therapy yesterday.  Objective:   VITALS:   Vitals:   01/27/20 1750 01/27/20 2115 01/28/20 0106 01/28/20 0511  BP: 112/81 111/84 111/78 106/67  Pulse: (!) 53 (!) 50 (!) 45 (!) 52  Resp: 16 16 14 14   Temp: 97.6 F (36.4 C) 97.9 F (36.6 C) (!) 97.5 F (36.4 C) 97.6 F (36.4 C)  TempSrc: Oral Oral Oral Oral  SpO2: 100% 100% 100% 99%  Weight:      Height:       CBC Latest Ref Rng & Units 01/19/2020 07/03/2019 04/18/2018  WBC 4.0 - 10.5 K/uL 9.3 11.2(H) 12.6(H)  Hemoglobin 12.0 - 15.0 g/dL 16.0(H) 14.4 14.1  Hematocrit 36.0 - 46.0 % 48.7(H) 42.3 42.8  Platelets 150 - 400 K/uL 353 300 302   BMP Latest Ref Rng & Units 01/27/2020 01/19/2020 07/03/2019  Glucose 70 - 99 mg/dL 86 94 88  BUN 6 - 20 mg/dL 5(L) 5(L) 8  Creatinine 0.44 - 1.00 mg/dL 07/05/2019 4.23 5.36  Sodium 135 - 145 mmol/L 139 137 140  Potassium 3.5 - 5.1 mmol/L 3.6 3.0(L) 4.0  Chloride 98 - 111 mmol/L 97(L) 91(L) 105  CO2 22 - 32 mmol/L 33(H) 33(H) 25  Calcium 8.9 - 10.3 mg/dL 1.44) 9.1 3.1(V)   Intake/Output      03/23 0701 - 03/24 0700 03/24 0701 - 03/25 0700   P.O. 600    I.V. (mL/kg) 3373.8 (35.4)    IV Piggyback 550    Total Intake(mL/kg) 4523.8 (47.5)    Urine (mL/kg/hr) 1775 (0.8)    Blood 50    Total Output 1825    Net +2698.8            Physical Exam: General: NAD.  Upright in bed.  And conversant. Resp: No increased wob Cardio: Mild bradycardia.  Regular rhythm. ABD soft Neurologically intact MSK Neurovascularly intact Sensation intact distally Intact pulses distally Dorsiflexion/Plantar flexion intact Incision: dressing C/D/I with a few areas of very scant drainage.   Assessment: 1 Day Post-Op  S/P Procedure(s) (LRB): TOTAL KNEE ARTHROPLASTY (Left) by Dr. 4/25. Jewel Baize on 01/27/2020  Principal Problem:   Primary osteoarthritis of left knee Active  Problems:   Depression   Essential hypertension, benign   GERD (gastroesophageal reflux disease)   Pulmonary embolism on long-term anticoagulation therapy (HCC)   Primary osteoarthritis, status post left total knee arthroplasty Doing well postop day 1 Tolerating diet and voiding Pain controlled Good early mobilization.  Plan: Up with therapy D/C IV fluids Incentive Spirometry Elevate and Apply ice CPM, bone foam  Weight Bearing: Weight Bearing as Tolerated (WBAT) LLE Dressings: Maintain Mepilex.  Please ensure thigh high TED hose are applied to operative leg prior to discharge. VTE prophylaxis: Resume Xarelto this morning, SCDs, ambulation Dispo: Plan for discharge Home today after a.m. therapy   Patient's anticipated LOS is less than 2 midnights, meeting these requirements: - Younger than 33 - Lives within 1 hour of care - Has a competent adult at home to recover with post-op recover - NO history of             - Chronic pain requiring opiods             - Diabetes             - Coronary Artery Disease             -  Heart failure             - Heart attack             - Stroke             - Cardiac arrhythmia             - Respiratory Failure/COPD             - Renal failure             - Anemia             - Advanced Liver disease   Prudencio Burly III, PA-C 01/28/2020, 7:49 AM

## 2020-01-28 NOTE — Progress Notes (Signed)
Physical Therapy Treatment Patient Details Name: Katie [name] MRN: [id number] DOB: [birth year] Today's Date: 01/28/2020    History of Present Illness L TKA on 01/27/20. PMH of anxiety/depression, HTN, PE    PT Comments    Pt ambulated 140' with RW, no loss of balance. Reviewed TKA HEP and reviewed stair training. She demonstrates good safety awareness and good understanding of HEP. She is ready to DC home from PT standpoint.    Follow Up Recommendations  Home health PT;Follow surgeon's recommendation for DC plan and follow-up therapies     Equipment Recommendations  None recommended by PT    Recommendations for Other Services       Precautions / Restrictions Precautions Precautions: Knee;Fall Precaution Comments: 3 falls in past 1 year, L knee gave out Required Braces or Orthoses: Knee Immobilizer - Left Knee Immobilizer - Left: Discontinue once straight leg raise with < 10 degree lag Restrictions Weight Bearing Restrictions: No Other Position/Activity Restrictions: WBAT    Mobility  Bed Mobility Overal bed mobility: Needs Assistance Bed Mobility: Supine to Sit     Supine to sit: Min guard     General bed mobility comments: up in chair  Transfers Overall transfer level: Modified independent Equipment used: Rolling walker (2 wheeled) Transfers: Sit to/from Stand Sit to Stand: Modified independent (Device/Increase time)         General transfer comment: good hand placement  Ambulation/Gait Ambulation/Gait assistance: Modified independent (Device/Increase time) Gait Distance (Feet): 140 Feet Assistive device: Rolling walker (2 wheeled) Gait Pattern/deviations: Step-to pattern;Decreased step length - right;Decreased step length - left Gait velocity: decr   General Gait Details: good recall with heel strike   Stairs Stairs: Yes Stairs assistance: Min assist Stair Management: Two rails;Step to pattern;Forwards Number of Stairs: 3 General stair comments: 3  stairs with B rails, VCs sequencing; min A to manage RW   Wheelchair Mobility    Modified Rankin (Stroke Patients Only)       Balance Overall balance assessment: Modified Independent                                          Cognition Arousal/Alertness: Awake/alert Behavior During Therapy: WFL for tasks assessed/performed Overall Cognitive Status: Within Functional Limits for tasks assessed                                        Exercises Total Joint Exercises Ankle Circles/Pumps: AROM;Both;10 reps;Supine Quad Sets: AROM;Left;Supine;10 reps Short Arc Quad: AROM;Left;10 reps;Supine Heel Slides: AAROM;Left;10 reps;Supine Hip ABduction/ADduction: AAROM;Left;10 reps;Supine Straight Leg Raises: AAROM;Left;10 reps;Supine(performed 2 of 10 reps without assistance) Long Arc Quad: AROM;Left;10 reps;Seated Knee Flexion: AAROM;Left;5 reps;Seated Goniometric ROM: 5-55* AAROM L knee    General Comments        Pertinent Vitals/Pain Pain Score: 5  Pain Location: L knee Pain Descriptors / Indicators: Sore Pain Intervention(s): Limited activity within patient's tolerance;Monitored during session;Premedicated before session;Ice applied    Home Living                      Prior Function            PT Goals (current goals can now be found in the care plan section) Acute Rehab PT Goals Patient Stated Goal: work out at [employer] PT Goal Formulation: With  patient Time For Goal Achievement: 02/03/20 Potential to Achieve Goals: Good Progress towards PT goals: Progressing toward goals    Frequency    7X/week      PT Plan Current plan remains appropriate    Co-evaluation              AM-PAC PT "6 Clicks" Mobility   Outcome Measure  Help needed turning from your back to your side while in a flat bed without using bedrails?: A Little Help needed moving from lying on your back to sitting on the side of a flat bed without using  bedrails?: A Little Help needed moving to and from a bed to a chair (including a wheelchair)?: None Help needed standing up from a chair using your arms (e.g., wheelchair or bedside chair)?: None Help needed to walk in hospital room?: None Help needed climbing 3-5 steps with a railing? : A Little 6 Click Score: 21    End of Session Equipment Utilized During Treatment: Gait belt Activity Tolerance: Patient tolerated treatment well Patient left: in chair;with call bell/phone within reach;with chair alarm set Nurse Communication: Mobility status PT Visit Diagnosis: Muscle weakness (generalized) (M62.81);Difficulty in walking, not elsewhere classified (R26.2)     Time: 9147-8295 PT Time Calculation (min) (ACUTE ONLY): 33 min  Charges:  $Gait Training: 8-22 mins $Therapeutic Exercise: 8-22 mins                    Ralene Bathe Kistler PT 01/28/2020  Acute Rehabilitation Services Pager [phone] Office [phone]

## 2020-01-28 NOTE — Discharge Summary (Signed)
Discharge Summary  Patient ID: Katie [name] MRN: [id number] DOB/AGE: 04/21/1966 54 y.o.  Admit date: 01/27/2020 Discharge date: 01/28/2020  Admission Diagnoses:  Primary osteoarthritis of left knee  Discharge Diagnoses:  Principal Problem:   Primary osteoarthritis of left knee Active Problems:   Depression   Essential hypertension, benign   GERD (gastroesophageal reflux disease)   Pulmonary embolism on long-term anticoagulation therapy [facility])   Past Medical History:  Diagnosis Date  . Anxiety   . Back pain   . Bipolar 1 disorder (HCC)   . Chronic pain entered 08/15/2015   Treated with Oxycodone  . Depression   . Depression   . Dyspnea   . History of acute bronchitis   . HTN (hypertension)   . OA (osteoarthritis)   . Obesity   . PE (pulmonary embolism)    oct 2014  . Sciatica     Surgeries: Procedure(s): TOTAL KNEE ARTHROPLASTY on 01/27/2020   Consultants (if any):   Discharged Condition: Improved  Hospital Course: Katie [name] is an 54 y.o. female who was admitted 01/27/2020 with a diagnosis of Primary osteoarthritis of left knee and went to the operating room on 01/27/2020 and underwent the above named procedures.    She was given perioperative antibiotics:  Anti-infectives (From admission, onward)   Start     Dose/Rate Route Frequency Ordered Stop   01/27/20 1330  ceFAZolin (ANCEF) IVPB 1 g/50 mL premix     1 g 100 mL/hr over 30 Minutes Intravenous Every 6 hours 01/27/20 1056 01/27/20 2032   01/27/20 0600  ceFAZolin (ANCEF) IVPB 2g/100 mL premix     2 g 200 mL/hr over 30 Minutes Intravenous On call to O.R. 01/27/20 0527 01/27/20 0743   01/27/20 0530  ceFAZolin (ANCEF) 2-4 GM/100ML-% IVPB    Note to Pharmacy: Viviano Simas   : cabinet override      01/27/20 0530 01/27/20 0749    .  She was given sequential compression devices, early ambulation, and Xarelto was resumed for DVT prophylaxis.  She benefited maximally from the hospital stay and there were no  complications.    Recent vital signs:  Vitals:   01/28/20 0106 01/28/20 0511  BP: 111/78 106/67  Pulse: (!) 45 (!) 52  Resp: 14 14  Temp: (!) 97.5 F (36.4 C) 97.6 F (36.4 C)  SpO2: 100% 99%    Recent laboratory studies:  Lab Results  Component Value Date   HGB 16.0 (H) 01/19/2020   HGB 14.4 07/03/2019   HGB 14.1 04/18/2018   Lab Results  Component Value Date   WBC 9.3 01/19/2020   PLT 353 01/19/2020   Lab Results  Component Value Date   INR 1.26 01/14/2015   Lab Results  Component Value Date   NA 139 01/27/2020   K 3.6 01/27/2020   CL 97 (L) 01/27/2020   CO2 33 (H) 01/27/2020   BUN 5 (L) 01/27/2020   CREATININE 0.58 01/27/2020   GLUCOSE 86 01/27/2020    Discharge Medications:   Allergies as of 01/28/2020      Reactions   Percocet [oxycodone-acetaminophen] Hives   From the Tylenol, she can take oxycodone by itself   Tylenol [acetaminophen] Hives, Swelling   Vicodin [hydrocodone-acetaminophen] Hives   Hives & swelling      Medication List    STOP taking these medications   cyclobenzaprine 5 MG tablet Commonly known as: FLEXERIL   diclofenac sodium 1 % Gel Commonly known as: VOLTAREN     TAKE these  medications   diazepam 5 MG tablet Commonly known as: VALIUM Take 5 mg by mouth in the morning and at bedtime.   docusate sodium 100 MG capsule Commonly known as: Colace Take 1 capsule (100 mg total) by mouth 2 (two) times daily. To prevent constipation while taking pain medication.   furosemide 40 MG tablet Commonly known as: LASIX Take 1 tablet (40 mg total) by mouth daily.   gabapentin 300 MG capsule Commonly known as: Neurontin Take 1 capsule (300 mg total) by mouth 3 (three) times daily as needed for up to 14 days. For 2 weeks post op for pain.   methocarbamol 500 MG tablet Commonly known as: Robaxin Take 1 tablet (500 mg total) by mouth every 8 (eight) hours as needed for muscle spasms.   omeprazole 40 MG capsule Commonly known as:  PRILOSEC Take 40 mg by mouth daily.   ondansetron 4 MG tablet Commonly known as: Zofran Take 1 tablet (4 mg total) by mouth every 8 (eight) hours as needed for nausea or vomiting.   oxyCODONE 5 MG immediate release tablet Commonly known as: Roxicodone Take 1 tablet (5 mg total) by mouth every 4 (four) hours as needed for up to 7 days for breakthrough pain.   phentermine 37.5 MG tablet Commonly known as: ADIPEX-P Take 37.5 mg by mouth every morning.   potassium chloride SA 20 MEQ tablet Commonly known as: KLOR-CON Take 20 mEq by mouth 2 (two) times daily.   rivaroxaban 20 MG Tabs tablet Commonly known as: XARELTO Take 1 tablet (20 mg total) by mouth daily with supper. Starting 10/18, take 20mg  daily What changed: additional instructions       Diagnostic Studies: DG Knee Left Port  Result Date: 01/27/2020 CLINICAL DATA:  Left knee arthroplasty EXAM: PORTABLE LEFT KNEE - 1-2 VIEW COMPARISON:  09/07/2019 FINDINGS: Interval postsurgical changes from left total knee arthroplasty. Arthroplasty components are in their expected alignment without periprosthetic fracture. Expected postoperative changes within the overlying soft tissues. IMPRESSION: Interval left total knee arthroplasty. Electronically Signed   By: Davina Poke D.O.   On: 01/27/2020 10:06    Disposition: Discharge disposition: 01-Home or Self Care       Discharge Instructions    Discharge patient   Complete by: As directed    After A.M. Therapy   Discharge disposition: 01-Home or Self Care   Discharge patient date: 01/28/2020      Follow-up Information    Renette Butters, MD.   Specialty: Orthopedic Surgery Contact information: [address] [place] [address] [phone]            Signed: Prudencio Burly III PA-C 01/28/2020, 7:54 AM

## 2020-01-29 ENCOUNTER — Encounter: Payer: Self-pay | Admitting: *Deleted

## 2020-01-31 DIAGNOSIS — E119 Type 2 diabetes mellitus without complications: Secondary | ICD-10-CM | POA: Diagnosis not present

## 2020-02-01 DIAGNOSIS — E119 Type 2 diabetes mellitus without complications: Secondary | ICD-10-CM | POA: Diagnosis not present

## 2020-02-02 DIAGNOSIS — E119 Type 2 diabetes mellitus without complications: Secondary | ICD-10-CM | POA: Diagnosis not present

## 2020-02-03 DIAGNOSIS — E119 Type 2 diabetes mellitus without complications: Secondary | ICD-10-CM | POA: Diagnosis not present

## 2020-02-04 DIAGNOSIS — E119 Type 2 diabetes mellitus without complications: Secondary | ICD-10-CM | POA: Diagnosis not present

## 2020-02-05 DIAGNOSIS — E119 Type 2 diabetes mellitus without complications: Secondary | ICD-10-CM | POA: Diagnosis not present

## 2020-02-06 DIAGNOSIS — E119 Type 2 diabetes mellitus without complications: Secondary | ICD-10-CM | POA: Diagnosis not present

## 2020-02-07 DIAGNOSIS — E119 Type 2 diabetes mellitus without complications: Secondary | ICD-10-CM | POA: Diagnosis not present

## 2020-02-08 DIAGNOSIS — E119 Type 2 diabetes mellitus without complications: Secondary | ICD-10-CM | POA: Diagnosis not present

## 2020-02-09 DIAGNOSIS — E119 Type 2 diabetes mellitus without complications: Secondary | ICD-10-CM | POA: Diagnosis not present

## 2020-02-10 DIAGNOSIS — E119 Type 2 diabetes mellitus without complications: Secondary | ICD-10-CM | POA: Diagnosis not present

## 2020-02-11 DIAGNOSIS — E119 Type 2 diabetes mellitus without complications: Secondary | ICD-10-CM | POA: Diagnosis not present

## 2020-02-12 ENCOUNTER — Ambulatory Visit: Payer: Medicaid Other | Attending: Orthopedic Surgery | Admitting: Physical Therapy

## 2020-02-12 ENCOUNTER — Other Ambulatory Visit: Payer: Self-pay

## 2020-02-12 ENCOUNTER — Encounter: Payer: Self-pay | Admitting: Physical Therapy

## 2020-02-12 DIAGNOSIS — R262 Difficulty in walking, not elsewhere classified: Secondary | ICD-10-CM

## 2020-02-12 DIAGNOSIS — M25662 Stiffness of left knee, not elsewhere classified: Secondary | ICD-10-CM | POA: Diagnosis present

## 2020-02-12 DIAGNOSIS — E119 Type 2 diabetes mellitus without complications: Secondary | ICD-10-CM | POA: Diagnosis not present

## 2020-02-12 DIAGNOSIS — M6281 Muscle weakness (generalized): Secondary | ICD-10-CM | POA: Diagnosis present

## 2020-02-12 DIAGNOSIS — M25562 Pain in left knee: Secondary | ICD-10-CM | POA: Insufficient documentation

## 2020-02-12 NOTE — Patient Instructions (Signed)
     Garen Lah, PT Certified Exercise Expert for the Aging Adult  02/12/20 8:00 AM Phone: [phone] Fax: [phone]

## 2020-02-12 NOTE — Therapy (Signed)
[facility] [address] [place], [place], [address] Phone: [phone]   Fax:  [phone]  Physical Therapy Evaluation  Patient Details  Name: Katie [name]: [id number] Date of Birth: 11/09/1965 Referring Provider (PT): Renaye RakersMurphy, Tim MD   Encounter Date: 02/12/2020  PT End of Session - 02/12/20 0759    Visit Number  1    Number of Visits  16    Date for PT Re-Evaluation  04/08/20    Authorization Type  MCD  1st authorization submitted 02-12-20 for 3 visits in 2 weeks reauthorize on 02-24-20    PT Start Time  0800    PT Stop Time  0900    PT Time Calculation (min)  60 min    Activity Tolerance  Patient tolerated treatment well    Behavior During Therapy  [facility] for tasks assessed/performed       Past Medical History:  Diagnosis Date  . Anxiety   . Back pain   . Bipolar 1 disorder (HCC)   . Chronic pain entered 08/15/2015   Treated with Oxycodone  . Depression   . Depression   . Dyspnea   . History of acute bronchitis   . HTN (hypertension)   . OA (osteoarthritis)   . Obesity   . PE (pulmonary embolism)    oct 2014  . Sciatica     Past Surgical History:  Procedure Laterality Date  . ADENOIDECTOMY    . BIOPSY STOMACH    . CARDIAC CATHETERIZATION  2009   [place]  . CESAREAN SECTION  2002   x 2, 1997 and 2002  . CHOLECYSTECTOMY  2007  . KNEE SURGERY Right 2005  . TONSILLECTOMY    . TOTAL KNEE ARTHROPLASTY Left 01/27/2020   Procedure: TOTAL KNEE ARTHROPLASTY;  Surgeon: Sheral ApleyMurphy, Timothy D, MD;  Location: [facility];  Service: Orthopedics;  Laterality: Left;  . TYMPANOSTOMY TUBE PLACEMENT      There were no vitals filed for this visit.   Subjective Assessment - 02/12/20 0807    Subjective  I had TKR LT on 01-27-20.  I have had 4 HHPT visits. when I was younger I had a brace between my legs and my left leg was longer than the right. I have lost 90 lbs and I want get back to exercise in the pool. and  exercise room. and return to shop without riding in cart    Pertinent History  DDD, Sciatica, RTC syndrome, plantar fasciitis, carpal tunnel.  OA, obesity,  See medical History, ALLERGIC to Percocet, Hydrocodone,Vicodin    Limitations  Standing;Walking;House hold activities;Sitting    How long can you sit comfortably?  10 minutes    How long can you stand comfortably?  10 minutes    How long can you walk comfortably?  10 minutes    Diagnostic tests  x ray    Patient Stated Goals  I have 10, 19, 24 children and i want to get back and spend time with them, [facility] [employer] and exercise, cooking    Currently in Pain?  Yes    Pain Score  5     Pain Location  Knee    Pain Orientation  Left    Pain Descriptors / Indicators  Shooting;Stabbing;Aching    Pain Type  Surgical pain;Acute pain    Pain Onset  1 to 4 weeks ago   march 23 TKRLT surgery   Pain Frequency  Intermittent    Aggravating Factors  bending it too much standing on it too. hard to get in and out of the car, difficulty on steps         [facility] PT Assessment - 02/12/20 0001      Assessment   Medical Diagnosis  LT TKR    Referring Provider (PT)  Renaye Rakers MD    Onset Date/Surgical Date  01/27/20    Hand Dominance  Right    Next MD Visit  May 5th    Prior Therapy  Not for LT knee   had HHPT 4 visits      Precautions   Precautions  Knee;Fall    Precaution Comments  using walker for now      Restrictions   Weight Bearing Restrictions  Yes    LLE Weight Bearing  Weight bearing as tolerated      Balance Screen   Has the patient fallen in the past 6 months  Yes    How many times?  1   09-06-19 fell on steps with LT LE   Has the patient had a decrease in activity level because of a fear of falling?   No    Is the patient reluctant to leave their home because of a fear of falling?   No      Home Environment   Living Environment  Private residence    Living Arrangements  Spouse/significant other;Children    Home Access  Stairs  to enter    Entrance Stairs-Number of Steps  5     Entrance Stairs-Rails  Can reach both    Home Layout  One level      Cognition   Overall Cognitive Status  Within Functional Limits for tasks assessed      Observation/Other Assessments   Focus on Therapeutic Outcomes (FOTO)   NA      Circumferential Edema   Circumferential - Right  44 cm    Circumferential - Left   50 cn      Sensation   Light Touch  Appears Intact    Additional Comments  some numbness over lateral aspect of LT knee post surgery      Posture/Postural Control   Posture/Postural Control  Postural limitations    Postural Limitations  Rounded Shoulders;Forward head;Anterior pelvic tilt    Posture Comments  increased abdominal girth   recently lost 90 lbs in preparation for TKA surgery      ROM / Strength   AROM / PROM / Strength  AROM;PROM;Strength      AROM   Overall AROM   Deficits    Right Knee Extension  0    Right Knee Flexion  117    Left Knee Extension  10    Left Knee Flexion  75      PROM   Overall PROM   Deficits    Right Knee Extension  -5   past neutral hyperextend   Right Knee Flexion  129    Left Knee Extension  +8   10 from mat   Left Knee Flexion  80      Strength   Overall Strength  Deficits    Right Hip Flexion  4+/5    Right Hip Extension  4-/5    Right Hip ABduction  4-/5    Left Hip Flexion  4+/5    Left Hip Extension  3-/5    Left Hip ABduction  3-/5    Right Knee Flexion  4/5    Right Knee  Extension  4/5    Left Knee Flexion  3-/5    Left Knee Extension  3-/5      Palpation   Palpation comment  well healing scar with edema on anterior knee.  tenderness over surgical incison       Ambulation/Gait   Assistive device  Rolling walker    Gait Pattern  Step-to pattern;Decreased stance time - left;Decreased stride length;Decreased hip/knee flexion - left;Antalgic    Ambulation Surface  Level    Gait Comments  Pt recieved tennis balls on base of walker                 Objective measurements completed on examination: See above findings.      [facility] Adult PT Treatment/Exercise - 02/12/20 0001      Knee/Hip Exercises: Stretches   Active Hamstring Stretch  Left;3 reps;30 seconds    Active Hamstring Stretch Limitations  supine with strap      Knee/Hip Exercises: Standing   Functional Squat  1 set;5 reps    Functional Squat Limitations  using walker and VC and TC      Knee/Hip Exercises: Seated   Long Arc Quad  Strengthening;Left;1 set;10 reps      Knee/Hip Exercises: Supine   Quad Sets  Strengthening;Left;1 set;5 reps    Short Arc Quad Sets  Strengthening;Left;1 set;5 reps    Heel Slides  Strengthening;Left;1 set;10 reps    Heel Slides Limitations  with strap VC and TC      Vasopneumatic   Number Minutes Vasopneumatic   15 minutes    Vasopnuematic Location   Knee   LT   Vasopneumatic Pressure  Medium    Vasopneumatic Temperature   32 degrees             PT Education - 02/12/20 0830    Education Details  POC Explanation of findings vasopneumatic and initial HEP    Person(s) Educated  Patient    Methods  Explanation;Demonstration;Tactile cues;Verbal cues;Handout    Comprehension  Verbalized understanding;Returned demonstration       PT Short Term Goals - 02/12/20 1000      PT SHORT TERM GOAL #1   Title  Independent with initial HEP    Time  2    Period  Weeks    Status  New    Target Date  02/26/20      PT SHORT TERM GOAL #2   Title  AROM of knee extension -8 to 100 flexion to increase mobility for transitional movements    Time  2    Period  Weeks    Status  New    Target Date  02/26/20      PT SHORT TERM GOAL #3   Title  Edema of LT knee will decreased from 50cm to 45 cm    Baseline  eval  LT 50 cm RT 43 cm    Time  2    Period  Weeks    Status  New    Target Date  02/26/20        PT Long Term Goals - 02/12/20 1006      PT LONG TERM GOAL #1   Title  Pt will be independent with advanced  HEP.    Time  8    Period  Weeks    Status  New    Target Date  04/08/20      PT LONG TERM GOAL #2   Title  Pt will be able  to negotiate steps without exacerbation of pain greater than 2/10  and LRAD    Time  8    Period  Weeks    Status  New    Target Date  04/08/20      PT LONG TERM GOAL #3   Title  Pt will improve her LT knee flexion to  >/= 120 degrees and extension to </= 5 degrees with </= 2/10 pain for a more functional and efficient gait pattern    Time  8    Period  Weeks    Status  New    Target Date  04/08/20      PT LONG TERM GOAL #4   Title  Pt will be able to perform standing to floor transfer in order to play with kids and to ensure safety in home    Time  8    Period  Weeks    Status  New    Target Date  04/08/20      PT LONG TERM GOAL #5   Title  PT with be able to walk/stand >/= 1 hour with no AD with </= 2/10 pain for functional endurance and return to shopping/leisure activities    Time  8    Period  Weeks    Status  New    Target Date  04/08/20             Plan - 02/12/20 0807    Clinical Impression Statement  Pt is a  54  year old female s/p LT TKA on   3 /  23 /21 by Dr Marvene Staff.  Pt has lost 90 lb and would like to return to gym with her 3 kids.  Pt presents with impairments including pain, knee weakness, impaired ROM, difficulty with walking, stairs, and with transfers . Pt would benefit from skilled PT for 2 times a week for 8 weeks to address above impariments and functional limitations and return to pain-free PLOF    Personal Factors and Comorbidities  Comorbidity 1;Comorbidity 2    Comorbidities  DDD, Sciatica, RTC syndrome, plantar fasciitis, carpal tunnel.  OA, obesity,  See medical History, ALLERGIC to Percocet, Hydrocodone,Vicodin, Aleve    Examination-Activity Limitations  Bend;Dressing;Hygiene/Grooming;Toileting;Stand;Stairs;Squat;Sleep;Transfers;Locomotion Level    Stability/Clinical Decision Making  Evolving/Moderate  complexity    Clinical Decision Making  Moderate    Rehab Potential  Good    PT Frequency  2x / week    PT Duration  8 weeks    PT Treatment/Interventions  Joint Manipulations;ADLs/Self Care Home Management;Cryotherapy;Electrical Stimulation;Ultrasound;Moist Heat;Iontophoresis 4mg /ml Dexamethasone;Gait training;Stair training;Therapeutic activities;Functional mobility training;Therapeutic exercise;Balance training;Patient/family education;Neuromuscular re-education;Manual techniques;Passive range of motion;Vasopneumatic Device;Taping;Dry needling    PT Next Visit Plan  REivew HEP  joint assit wall slides HEP/sheet  Manual flexion was -10 to 80    PT Home Exercise Plan  Phase 1 TKR exercises    Consulted and Agree with Plan of Care  Patient       Patient will benefit from skilled therapeutic intervention in order to improve the following deficits and impairments:  Difficulty walking, Decreased activity tolerance, Decreased balance, Decreased mobility, Decreased range of motion, Decreased strength, Increased edema, Increased muscle spasms, Postural dysfunction, Improper body mechanics, Pain, Obesity  Visit Diagnosis: Acute pain of left knee  Stiffness of left knee, not elsewhere classified  Muscle weakness (generalized)  Difficulty in walking, not elsewhere classified     Problem List Patient Active Problem List   Diagnosis Date Noted  . Primary osteoarthritis of  left knee 12/29/2019  . Morbid obesity (HCC)   . Bradycardia   . Pulmonary embolism on long-term anticoagulation therapy (HCC) 04/06/2016  . Pain in the chest   . Chronic pain   . Chest wall pain   . Pulmonary embolism (HCC) 07/31/2013  . Vocal cord dysfunction 02/28/2012  . Dyspnea 01/17/2012  . Chest pain 11/25/2011  . Hypokalemia 11/25/2011  . GERD (gastroesophageal reflux disease) 03/08/2011  . DYSMENORRHEA 11/10/2010  . 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  ROTATOR CUFF SYNDROME, RIGHT 04/11/2010  . VITAMIN D DEFICIENCY 02/18/2010  . Bipolar 1 disorder (HCC) 01/17/2010  . Essential hypertension, benign 01/17/2010  . INSOMNIA 01/17/2010  . OBESITY, NOS 01/03/2007  . Depression 01/03/2007  . HEARING LOSS NOS OR DEAFNESS 01/03/2007  . EDEMA-LEGS,DUE TO VENOUS OBSTRUCT. 01/03/2007  . Irritable bowel syndrome 01/03/2007   Garen Lah, PT Certified Exercise Expert for the Aging Adult  02/12/20 10:27 AM Phone: [phone] Fax: [phone]  [facility] [address] [place], [place], [address] Phone: [phone]   Fax:  [phone]  Name: Katie [name] [name]: [id number] Date of Birth: 1966-09-02

## 2020-02-13 DIAGNOSIS — E119 Type 2 diabetes mellitus without complications: Secondary | ICD-10-CM | POA: Diagnosis not present

## 2020-02-14 DIAGNOSIS — E119 Type 2 diabetes mellitus without complications: Secondary | ICD-10-CM | POA: Diagnosis not present

## 2020-02-15 DIAGNOSIS — E119 Type 2 diabetes mellitus without complications: Secondary | ICD-10-CM | POA: Diagnosis not present

## 2020-02-16 DIAGNOSIS — E119 Type 2 diabetes mellitus without complications: Secondary | ICD-10-CM | POA: Diagnosis not present

## 2020-02-17 ENCOUNTER — Encounter: Payer: Self-pay | Admitting: Physical Therapy

## 2020-02-17 ENCOUNTER — Other Ambulatory Visit: Payer: Self-pay

## 2020-02-17 ENCOUNTER — Ambulatory Visit: Payer: Medicaid Other | Admitting: Physical Therapy

## 2020-02-17 DIAGNOSIS — R262 Difficulty in walking, not elsewhere classified: Secondary | ICD-10-CM

## 2020-02-17 DIAGNOSIS — M25562 Pain in left knee: Secondary | ICD-10-CM

## 2020-02-17 DIAGNOSIS — E119 Type 2 diabetes mellitus without complications: Secondary | ICD-10-CM | POA: Diagnosis not present

## 2020-02-17 DIAGNOSIS — M6281 Muscle weakness (generalized): Secondary | ICD-10-CM

## 2020-02-17 DIAGNOSIS — M25662 Stiffness of left knee, not elsewhere classified: Secondary | ICD-10-CM

## 2020-02-17 NOTE — Therapy (Signed)
[place], [place], [address] Phone: [phone]   Fax:  [phone]  Physical Therapy Treatment  Patient Details  Name: Katie [name] MRN: [id number] Date of Birth: May 09, 1966 Referring Provider (PT): Fredonia Highland MD   Encounter Date: 02/17/2020  PT End of Session - 02/17/20 1421    Visit Number  2    Number of Visits  16    Date for PT Re-Evaluation  04/08/20    Authorization Type  MCD  1st authorization submitted 02-12-20 for 3 visits in 2 weeks reauthorize on 02-24-20    PT Start Time  1415    PT Stop Time  1516    PT Time Calculation (min)  61 min    Activity Tolerance  Patient tolerated treatment well    Behavior During Therapy  [facility] for tasks assessed/performed       Past Medical History:  Diagnosis Date  . Anxiety   . Back pain   . Bipolar 1 disorder ([place])   . Chronic pain entered 08/15/2015   Treated with Oxycodone  . Depression   . Depression   . Dyspnea   . History of acute bronchitis   . HTN (hypertension)   . OA (osteoarthritis)   . Obesity   . PE (pulmonary embolism)    oct 2014  . Sciatica     Past Surgical History:  Procedure Laterality Date  . ADENOIDECTOMY    . BIOPSY STOMACH    . CARDIAC CATHETERIZATION  2009   normal; in [place]  . CESAREAN SECTION  2002   x 2, 1997 and 2002  . CHOLECYSTECTOMY  2007  . KNEE SURGERY Right 2005  . TONSILLECTOMY    . TOTAL KNEE ARTHROPLASTY Left 01/27/2020   Procedure: TOTAL KNEE ARTHROPLASTY;  Surgeon: Renette Butters, MD;  Location: [facility];  Service: Orthopedics;  Laterality: Left;  . TYMPANOSTOMY TUBE PLACEMENT      There were no vitals filed for this visit.  Subjective Assessment - 02/17/20 1531    Subjective  I    Patient Stated Goals  I have 10, 19, 24 children and i want to get back and spend time with them, go to [facility] [employer] and exercise, cooking    Currently in Pain?  Yes    Pain Score  5     Pain Location  Knee    Pain  Orientation  Left    Pain Descriptors / Indicators  Shooting;Aching    Pain Type  Surgical pain;Acute pain    Pain Onset  1 to 4 weeks ago    Pain Frequency  Constant         [facility] PT Assessment - 02/17/20 0001      Assessment   Medical Diagnosis  LT TKR    Referring Provider (PT)  Fredonia Highland MD    Onset Date/Surgical Date  01/27/20      PROM   Left Knee Extension  +5    Left Knee Flexion  100   105 PROM                  [facility] Adult PT Treatment/Exercise - 02/17/20 0001      Self-Care   Self-Care  Other Self-Care Comments    Other Self-Care Comments   How to measure TKA at home, How to use flex bar /walker to increase flexion.      Knee/Hip Exercises: Standing   Functional Squat  1 set;5 reps  Functional Squat Limitations  using walker and VC and TC    Other Standing Knee Exercises  wall slide with UE and wt shift RT/LT, then wall slide with opposite hip extension 3 x 10    Other Standing Knee Exercises  using walker and steps(6 and 4 for 10 inches. LT knee flexed to end range and 15 sec hold at end range 10 x      Knee/Hip Exercises: Seated   Other Seated Knee/Hip Exercises  seated knee flexion stretch with only sock to decrease flexion 8 x at end range flex 20 - 30 sec     Other Seated Knee/Hip Exercises  seated knee ext resisted red t band 3 x 10, seated knee flexion red t badn 3 x 10      Knee/Hip Exercises: Supine   Quad Sets  Strengthening;Left;1 set;5 reps    Heel Slides  Strengthening;Left;1 set;10 reps    Heel Slides Limitations  with strap VC and TC    Straight Leg Raises  Strengthening;Left;2 sets;10 reps      Vasopneumatic   Number Minutes Vasopneumatic   15 minutes    Vasopnuematic Location   Knee   LT   Vasopneumatic Pressure  Medium    Vasopneumatic Temperature   32 degrees      Manual Therapy   Manual Therapy  Joint mobilization    Joint Mobilization  patellar mobs inf/sup med/lateral, flex mobs grade 3 to end range     Passive ROM   knee ext /flex seated on high low table.  using reciprocal inhibition (flexing LT knee, pt extending RT simultaneously,              PT Education - 02/17/20 1501    Education Details  Added total joint wall UE and LE wall slide, how to measure TKA at home, flex bar instructions and updated HEP    Person(s) Educated  Patient    Methods  Explanation;Demonstration;Tactile cues;Verbal cues;Handout    Comprehension  Verbalized understanding;Returned demonstration       PT Short Term Goals - 02/17/20 1523      PT SHORT TERM GOAL #1   Title  Independent with initial HEP    Time  2    Period  Weeks    Status  On-going    Target Date  02/26/20      PT SHORT TERM GOAL #2   Title  AROM of knee extension -8 to 100 flexion to increase mobility for transitional movements    Baseline  -5 to 100    Time  2    Period  Weeks    Status  Achieved    Target Date  02/26/20      PT SHORT TERM GOAL #3   Title  Edema of LT knee will decreased from 50cm to 45 cm    Baseline  eval  LT 50 cm RT 43 cm    Time  2    Period  Weeks    Target Date  02/26/20        PT Long Term Goals - 02/12/20 1006      PT LONG TERM GOAL #1   Title  Pt will be independent with advanced HEP.    Time  8    Period  Weeks    Status  New    Target Date  04/08/20      PT LONG TERM GOAL #2   Title  Pt will be able to negotiate steps  without exacerbation of pain greater than 2/10  and LRAD    Time  8    Period  Weeks    Status  New    Target Date  04/08/20      PT LONG TERM GOAL #3   Title  Pt will improve her LT knee flexion to  >/= 120 degrees and extension to </= 5 degrees with </= 2/10 pain for a more functional and efficient gait pattern    Time  8    Period  Weeks    Status  New    Target Date  04/08/20      PT LONG TERM GOAL #4   Title  Pt will be able to perform standing to floor transfer in order to play with kids and to ensure safety in home    Time  8    Period  Weeks    Status  New     Target Date  04/08/20      PT LONG TERM GOAL #5   Title  PT with be able to walk/stand >/= 1 hour with no AD with </= 2/10 pain for functional endurance and return to shopping/leisure activities    Time  8    Period  Weeks    Status  New    Target Date  04/08/20          Access Code: [email or web address]: [email or web address]: 04/13/2021Prepared by: Wayland Denis BeardsleyExercises  Seated Hamstring Curl with Anchored Resistance - 2 x daily - 7 x weekly - 2 sets - 10 reps  Standing Hamstring Curl with Resistance - 2 x daily - 7 x weekly - 2 sets - 10 reps  Seated Knee Extension with Resistance - 2 x daily - 7 x weekly - 2 sets - 10 reps  Seated Knee Flexion Stretch - 4-5 x daily - 7 x weekly - 1 sets - 10 reps - 15-20 hold    Plan - 02/17/20 1521    Clinical Impression Statement  Pt enters clinic with rolling walker and step through gait with short stride.  Pt now -5 ext lag and 100 flex, PROM 105 on LT knee. Pt was educated on importance of exericise and stretching every day like her job. doing exericses/movement at 5 times throughout the day.  Pt making good strides toward progress toward STG    Comorbidities  DDD, Sciatica, RTC syndrome, plantar fasciitis, carpal tunnel.  OA, obesity,  See medical History, ALLERGIC to Percocet, Hydrocodone,Vicodin, Aleve    Examination-Activity Limitations  Bend;Dressing;Hygiene/Grooming;Toileting;Stand;Stairs;Squat;Sleep;Transfers;Locomotion Level    Clinical Decision Making  Moderate    Rehab Potential  Good    PT Frequency  2x / week    PT Duration  8 weeks    PT Treatment/Interventions  Joint Manipulations;ADLs/Self Care Home Management;Cryotherapy;Electrical Stimulation;Ultrasound;Moist Heat;Iontophoresis 4mg /ml Dexamethasone;Gait training;Stair training;Therapeutic activities;Functional mobility training;Therapeutic exercise;Balance training;Patient/family education;Neuromuscular re-education;Manual techniques;Passive range of  motion;Vasopneumatic Device;Taping;Dry needling    PT Next Visit Plan  REivew HEP  joint assit wall slides HEP/sheet  Manual flexion was -10 to 80    PT Home Exercise Plan  Phase 1 TKR exercises, [email or web address]    Consulted and Agree with Plan of Care  Patient       Patient will benefit from skilled therapeutic intervention in order to improve the following deficits and impairments:  Difficulty walking, Decreased activity tolerance, Decreased balance, Decreased mobility, Decreased range of motion, Decreased strength, Increased edema, Increased muscle spasms, Postural dysfunction, Improper body mechanics, Pain, Obesity  Visit Diagnosis: Acute pain  of left knee  Stiffness of left knee, not elsewhere classified  Muscle weakness (generalized)  Difficulty in walking, not elsewhere classified     Problem List Patient Active Problem List   Diagnosis Date Noted  . Primary osteoarthritis of left knee 12/29/2019  . Morbid obesity (HCC)   . Bradycardia   . Pulmonary embolism on long-term anticoagulation therapy (HCC) 04/06/2016  . Pain in the chest   . Chronic pain   . Chest wall pain   . Pulmonary embolism (HCC) 07/31/2013  . Vocal cord dysfunction 02/28/2012  . Dyspnea 01/17/2012  . Chest pain 11/25/2011  . Hypokalemia 11/25/2011  . GERD (gastroesophageal reflux disease) 03/08/2011  . DYSMENORRHEA 11/10/2010  . 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 ROTATOR CUFF SYNDROME, RIGHT 04/11/2010  . VITAMIN D DEFICIENCY 02/18/2010  . Bipolar 1 disorder (HCC) 01/17/2010  . Essential hypertension, benign 01/17/2010  . INSOMNIA 01/17/2010  . OBESITY, NOS 01/03/2007  . Depression 01/03/2007  . HEARING LOSS NOS OR DEAFNESS 01/03/2007  . EDEMA-LEGS,DUE TO VENOUS OBSTRUCT. 01/03/2007  . Irritable bowel syndrome 01/03/2007   Garen Lah, PT Certified Exercise Expert for the Aging Adult  02/17/20 3:33 PM Phone: [phone] Fax:  [phone]  [facility] [address] [place], [place], [address] Phone: [phone]   Fax:  [phone]  Name: CEONNA FRAZZINI MRN: [id number] Date of Birth: 1966-05-22

## 2020-02-17 NOTE — Patient Instructions (Addendum)
HOW TO MEASURE TOTAL KNEE PROGRESS AT HOME  Now that you have your total knee replacement, the first most important thing is to increase Active Range of Motion AROM.  Your therapist will measure using a goniometer in the clinic, but you can also keep track of your progress at home.  You will be given a home exercise program, but you will need to also stretch at home.  Below are some tools to help you accomplish this. TRACK IT Get a sturdy kitchen chair at home and back it up against a wall.  You need a floor with a low friction surface, so you can slide your foot easily. You may also use a cookie sheet if you are on carpeted floor. Take a yard stick or painters tape/masking tape laid down perpendicular to the wall. Sit in chair.  Slide your leg back as far as you can with a 3 second pull, then release.  Repeat these 10 times holding the last repetition 10-15 seconds.  Mark place where your Great toe is on maximum stretch in sitting. This is your beginning [employer]. Do 3 sets of 10 at or beyond the [facility] of your previous sessions. Record your toe mark at the end of your first and last session daily.  How to use Flex Bar Place the ball of your foot, not the arch, on the bar in the middle of the FLEX Bar. Place both hands on the topsides of the bar and slowly pull the bar toward your knee until you feel your knee stretching a moderate level.  If you can pull the bar to your knee without too much discomfort "walk" the legs of the Flex Bar closer to your body by lifting one leg of the Flex bar, placing it approximately 1 inch closer to you on the floor and then lifting the opposite another set of 10 at this level and hold the last repetition for 10-15 seconds. Release the stretch. If this stretch seemed hard repeat another set of 10 at moderate level. If it seems you can bend more, advance the FLEX bar closer to you and repeat another set of 10 pulling the bar to your knee.  Hold the stretch again,10 -1 15  seconds. You will be shown how to utilize a flex bar in the clinic so that you can maximize passive range of motion at home.   Remember your knee will swell and stiffen at night, so you will always feel like you are "starting over" in the mornings. But persevere, you will make progress.  Minimal stretch- low intensity, you can do other activities like talk on the phone and watch TV while you are doing them Moderate stretch-  you have some discomfort and pulling feeling in your knee. You will want to count how many you are doing, but pain is manageable with good pain management.  You are engaged in your activity and not doing other activities while stretching High intensity stretches- you are trying to move too fast in the rehab process. This stretch usually causes pain and reflexive withdrawal of the motion.  It may result in you having to take a day or two off from rehab to give time for knee to recover.   Slow and steady wins the race.      Garen Lah, PT Certified Exercise Expert for the Aging Adult  02/17/20 2:52 PM Phone: [phone] Fax: [phone]

## 2020-02-18 DIAGNOSIS — E119 Type 2 diabetes mellitus without complications: Secondary | ICD-10-CM | POA: Diagnosis not present

## 2020-02-19 ENCOUNTER — Ambulatory Visit: Payer: Medicaid Other | Admitting: Physical Therapy

## 2020-02-19 ENCOUNTER — Other Ambulatory Visit: Payer: Self-pay

## 2020-02-19 ENCOUNTER — Encounter: Payer: Self-pay | Admitting: Physical Therapy

## 2020-02-19 DIAGNOSIS — R262 Difficulty in walking, not elsewhere classified: Secondary | ICD-10-CM

## 2020-02-19 DIAGNOSIS — M25562 Pain in left knee: Secondary | ICD-10-CM | POA: Diagnosis not present

## 2020-02-19 DIAGNOSIS — M6281 Muscle weakness (generalized): Secondary | ICD-10-CM

## 2020-02-19 DIAGNOSIS — E119 Type 2 diabetes mellitus without complications: Secondary | ICD-10-CM | POA: Diagnosis not present

## 2020-02-19 DIAGNOSIS — M25662 Stiffness of left knee, not elsewhere classified: Secondary | ICD-10-CM

## 2020-02-20 ENCOUNTER — Encounter: Payer: Self-pay | Admitting: Physical Therapy

## 2020-02-20 DIAGNOSIS — E119 Type 2 diabetes mellitus without complications: Secondary | ICD-10-CM | POA: Diagnosis not present

## 2020-02-20 NOTE — Therapy (Signed)
[facility] [place], [place], [address] Phone: [phone]   Fax:  [phone]  Physical Therapy Treatment  Patient Details  Name: Katie [name] MRN: [id number] Date of Birth: 06-08-1966 Referring Provider (PT): Fredonia Highland MD   Encounter Date: 02/19/2020  PT End of Session - 02/19/20 1607    Visit Number  3    Number of Visits  16    Date for PT Re-Evaluation  04/08/20    Authorization Type  MCD  1st authorization submitted 02-12-20 for 3 visits in 2 weeks reauthorize on 02-24-20    PT Start Time  1512    PT Stop Time  1555    PT Time Calculation (min)  43 min    Activity Tolerance  Patient tolerated treatment well    Behavior During Therapy  [facility] for tasks assessed/performed       Past Medical History:  Diagnosis Date  . Anxiety   . Back pain   . Bipolar 1 disorder ([place])   . Chronic pain entered 08/15/2015   Treated with Oxycodone  . Depression   . Depression   . Dyspnea   . History of acute bronchitis   . HTN (hypertension)   . OA (osteoarthritis)   . Obesity   . PE (pulmonary embolism)    oct 2014  . Sciatica     Past Surgical History:  Procedure Laterality Date  . ADENOIDECTOMY    . BIOPSY STOMACH    . CARDIAC CATHETERIZATION  2009   normal; in [place]  . CESAREAN SECTION  2002   x 2, 1997 and 2002  . CHOLECYSTECTOMY  2007  . KNEE SURGERY Right 2005  . TONSILLECTOMY    . TOTAL KNEE ARTHROPLASTY Left 01/27/2020   Procedure: TOTAL KNEE ARTHROPLASTY;  Surgeon: Renette Butters, MD;  Location: [facility];  Service: Orthopedics;  Laterality: Left;  . TYMPANOSTOMY TUBE PLACEMENT      There were no vitals filed for this visit.  Subjective Assessment - 02/19/20 1605    Subjective  Patient reports her knee was doing great until this afternoon. She did her stretches a few times today and now she is having signifcant swelling in the knee. Per visual inspection the superior knee area is very swollen.     Pertinent History  DDD, Sciatica, RTC syndrome, plantar fasciitis, carpal tunnel.  OA, obesity,  See medical History, ALLERGIC to Percocet, Hydrocodone,Vicodin    Limitations  Standing;Walking;House hold activities;Sitting    How long can you sit comfortably?  10 minutes    How long can you stand comfortably?  10 minutes    How long can you walk comfortably?  10 minutes    Currently in Pain?  Yes    Pain Score  5     Pain Location  Knee    Pain Orientation  Left    Pain Descriptors / Indicators  Aching    Pain Type  Chronic pain    Pain Onset  1 to 4 weeks ago    Pain Frequency  Constant    Aggravating Factors   standing, walking;    Pain Relieving Factors  rest and ice, spasm medication                       [facility] Adult PT Treatment/Exercise - 02/20/20 0001      Knee/Hip Exercises: Supine   Quad Sets Limitations  2x10 5 sec hold  Heel Slides  Strengthening;Left;10 reps;2 sets    Heel Slides Limitations  with strap VC and TC      Modalities   Modalities  Cryotherapy      Cryotherapy   Number Minutes Cryotherapy  10 Minutes    Cryotherapy Location  Knee    Type of Cryotherapy  Ice pack      Manual Therapy   Manual Therapy  Joint mobilization;Edema management    Edema Management  to knee in elevated position     Joint Mobilization  patellar mobs inf/sup med/lateral, flex mobs grade 3 to end range     Passive ROM  knee ext /flex seated on high low table.  using reciprocal inhibition (flexing LT knee, pt extending RT simultaneously,              PT Education - 02/19/20 1607    Education Details  reviewed HEp and symptom management    Person(s) Educated  Patient    Methods  Explanation;Tactile cues;Demonstration;Verbal cues    Comprehension  Verbalized understanding;Returned demonstration;Verbal cues required;Tactile cues required       PT Short Term Goals - 02/20/20 0809      PT SHORT TERM GOAL #1   Title  Independent with initial HEP     Time  2    Period  Weeks    Status  On-going    Target Date  02/26/20      PT SHORT TERM GOAL #2   Title  AROM of knee extension -8 to 100 flexion to increase mobility for transitional movements    Baseline  -5 to 100    Time  2    Period  Weeks    Status  Achieved    Target Date  02/26/20      PT SHORT TERM GOAL #3   Title  Edema of LT knee will decreased from 50cm to 45 cm    Baseline  eval  LT 50 cm RT 43 cm    Time  2    Period  Weeks    Status  New    Target Date  02/26/20        PT Long Term Goals - 02/12/20 1006      PT LONG TERM GOAL #1   Title  Pt will be independent with advanced HEP.    Time  8    Period  Weeks    Status  New    Target Date  04/08/20      PT LONG TERM GOAL #2   Title  Pt will be able to negotiate steps without exacerbation of pain greater than 2/10  and LRAD    Time  8    Period  Weeks    Status  New    Target Date  04/08/20      PT LONG TERM GOAL #3   Title  Pt will improve her LT knee flexion to  >/= 120 degrees and extension to </= 5 degrees with </= 2/10 pain for a more functional and efficient gait pattern    Time  8    Period  Weeks    Status  New    Target Date  04/08/20      PT LONG TERM GOAL #4   Title  Pt will be able to perform standing to floor transfer in order to play with kids and to ensure safety in home    Time  8    Period  Weeks  Status  New    Target Date  04/08/20      PT LONG TERM GOAL #5   Title  PT with be able to walk/stand >/= 1 hour with no AD with </= 2/10 pain for functional endurance and return to shopping/leisure activities    Time  8    Period  Weeks    Status  New    Target Date  04/08/20            Plan - 02/20/20 0803    Clinical Impression Statement  Patient was very swollen today. She was late to her appointment so session was limited. Therapy focused on manual therapy for edeam mangement and ROM. She was advised to rest and ice over the next day or 2 until the swelling goes down.  She was given a quad set to do at home and a heel slide to maintain motion. Therapy will advance ther-ex and gait training next session if swelling is more controlled.    Personal Factors and Comorbidities  Comorbidity 1;Comorbidity 2    Comorbidities  DDD, Sciatica, RTC syndrome, plantar fasciitis, carpal tunnel.  OA, obesity,  See medical History, ALLERGIC to Percocet, Hydrocodone,Vicodin, Aleve    Examination-Activity Limitations  Bend;Dressing;Hygiene/Grooming;Toileting;Stand;Stairs;Squat;Sleep;Transfers;Locomotion Level    Stability/Clinical Decision Making  Evolving/Moderate complexity    Clinical Decision Making  Moderate    Rehab Potential  Good    PT Frequency  2x / week    PT Duration  8 weeks    PT Treatment/Interventions  Joint Manipulations;ADLs/Self Care Home Management;Cryotherapy;Electrical Stimulation;Ultrasound;Moist Heat;Iontophoresis 4mg /ml Dexamethasone;Gait training;Stair training;Therapeutic activities;Functional mobility training;Therapeutic exercise;Balance training;Patient/family education;Neuromuscular re-education;Manual techniques;Passive range of motion;Vasopneumatic Device;Taping;Dry needling    PT Next Visit Plan  REivew HEP  joint assit wall slides HEP/sheet  Manual flexion was -10 to 80    PT Home Exercise Plan  Phase 1 TKR exercises, [id number]    Consulted and Agree with Plan of Care  Patient       Patient will benefit from skilled therapeutic intervention in order to improve the following deficits and impairments:  Difficulty walking, Decreased activity tolerance, Decreased balance, Decreased mobility, Decreased range of motion, Decreased strength, Increased edema, Increased muscle spasms, Postural dysfunction, Improper body mechanics, Pain, Obesity  Visit Diagnosis: Acute pain of left knee  Stiffness of left knee, not elsewhere classified  Muscle weakness (generalized)  Difficulty in walking, not elsewhere classified     Problem List Patient Active  Problem List   Diagnosis Date Noted  . Primary osteoarthritis of left knee 12/29/2019  . Morbid obesity (HCC)   . Bradycardia   . Pulmonary embolism on long-term anticoagulation therapy (HCC) 04/06/2016  . Pain in the chest   . Chronic pain   . Chest wall pain   . Pulmonary embolism (H[facility]) 07/31/2013  . Vocal cord dysfunction 02/28/2012  . Dyspnea 01/17/2012  . Chest pain 11/25/2011  . Hypokalemia 11/25/2011  . GERD (gastroesophageal reflux disease) 03/08/2011  . DYSMENORRHEA 11/10/2010  . 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 ROTATOR CUFF SYNDROME, RIGHT 04/11/2010  . VITAMIN D DEFICIENCY 02/18/2010  . Bipolar 1 disorder (HCC) 01/17/2010  . Essential hypertension, benign 01/17/2010  . INSOMNIA 01/17/2010  . OBESITY, NOS 01/03/2007  . Depression 01/03/2007  . HEARING LOSS NOS OR DEAFNESS 01/03/2007  . EDEMA-LEGS,DUE TO VENOUS OBSTRUCT. 01/03/2007  . Irritable bowel syndrome 01/03/2007    01/05/2007 PT DPT  02/20/2020, 8:18 AM  [facility]  [place], [place], [address] Phone: [phone]   Fax:  [phone]  Name: Katie [name] MRN: [id number] Date of Birth: 09/10/1966

## 2020-02-21 DIAGNOSIS — E119 Type 2 diabetes mellitus without complications: Secondary | ICD-10-CM | POA: Diagnosis not present

## 2020-02-22 DIAGNOSIS — E119 Type 2 diabetes mellitus without complications: Secondary | ICD-10-CM | POA: Diagnosis not present

## 2020-02-23 DIAGNOSIS — E119 Type 2 diabetes mellitus without complications: Secondary | ICD-10-CM | POA: Diagnosis not present

## 2020-02-24 ENCOUNTER — Ambulatory Visit: Payer: Medicaid Other | Admitting: Physical Therapy

## 2020-02-24 ENCOUNTER — Other Ambulatory Visit: Payer: Self-pay

## 2020-02-24 DIAGNOSIS — R262 Difficulty in walking, not elsewhere classified: Secondary | ICD-10-CM

## 2020-02-24 DIAGNOSIS — E119 Type 2 diabetes mellitus without complications: Secondary | ICD-10-CM | POA: Diagnosis not present

## 2020-02-24 DIAGNOSIS — M25562 Pain in left knee: Secondary | ICD-10-CM | POA: Diagnosis not present

## 2020-02-24 DIAGNOSIS — M6281 Muscle weakness (generalized): Secondary | ICD-10-CM

## 2020-02-24 DIAGNOSIS — M25662 Stiffness of left knee, not elsewhere classified: Secondary | ICD-10-CM

## 2020-02-24 NOTE — Therapy (Signed)
[facility] [address] [place], [place], [address] Phone: [phone]   Fax:  [phone]  Physical Therapy Treatment  Patient Details  Name: Katie [name] MRN: [id number] Date of Birth: [birth year] Referring Provider (PT): Renaye Rakers MD   Encounter Date: 02/24/2020  PT End of Session - 02/24/20 1810    Visit Number  4    Number of Visits  16    Date for PT Re-Evaluation  04/08/20    Authorization Type  MCD  1st authorization submitted 02-12-20 for 3 visits in 2 weeks reauthorize on 02-24-20 2nd re authorization on 02-24-20    PT Start Time  1416    PT Stop Time  1516    PT Time Calculation (min)  60 min    Activity Tolerance  Patient tolerated treatment well    Behavior During Therapy  [facility] for tasks assessed/performed       Past Medical History:  Diagnosis Date  . Anxiety   . Back pain   . Bipolar 1 disorder (H[facility])   . Chronic pain entered 08/15/2015   Treated with Oxycodone  . Depression   . Depression   . Dyspnea   . History of acute bronchitis   . HTN (hypertension)   . OA (osteoarthritis)   . Obesity   . PE (pulmonary embolism)    oct 2014  . Sciatica     Past Surgical History:  Procedure Laterality Date  . ADENOIDECTOMY    . BIOPSY STOMACH    . CARDIAC CATHETERIZATION  2009   normal; in [place]  . CESAREAN SECTION  2002   x 2, 1997 and 2002  . CHOLECYSTECTOMY  2007  . KNEE SURGERY Right 2005  . TONSILLECTOMY    . TOTAL KNEE ARTHROPLASTY Left 01/27/2020   Procedure: TOTAL KNEE ARTHROPLASTY;  Surgeon: [name], MD;  Location: [facility];  Service: Orthopedics;  Laterality: Left;  . TYMPANOSTOMY TUBE PLACEMENT      There were no vitals filed for this visit.  Subjective Assessment - 02/24/20 1431    Subjective  Pt still with swelling in LT knee  3/10 today but better than the other day. I want to start using my cane now..    Pertinent History  DDD, Sciatica, RTC syndrome, plantar fasciitis,  carpal tunnel.  OA, obesity,  See medical History, ALLERGIC to Percocet, Hydrocodone,Vicodin    Limitations  Standing;Walking;House hold activities;Sitting    How long can you sit comfortably?  20 minutes    How long can you stand comfortably?  10-15    How long can you walk comfortably?  10-15    Diagnostic tests  x ray    Patient Stated Goals  I have 10, 19, 24 children and i want to get back and spend time with them, go to [facility] and exercise, cooking    Currently in Pain?  Yes    Pain Score  3     Pain Location  Knee    Pain Orientation  Left    Pain Descriptors / Indicators  Aching;Sore    Pain Type  Surgical pain;Chronic pain    Pain Onset  1 to 4 weeks ago    Pain Frequency  Constant         [facility] PT Assessment - 02/24/20 0001      Assessment   Medical Diagnosis  LT TKR    Referring Provider (PT)  Renaye Rakers MD    Onset Date/Surgical Date  01/27/20      Circumferential Edema   Circumferential - Right  44 cm    Circumferential - Left   49 cm      AROM   Left Knee Extension  5    Left Knee Flexion  103      PROM   Left Knee Extension  +3    Left Knee Flexion  107   107 PROM     Strength   Right Hip Flexion  4+/5    Right Hip Extension  4-/5    Right Hip ABduction  4-/5    Left Hip Flexion  4+/5    Left Hip Extension  4/5    Left Hip ABduction  4/5    Right Knee Flexion  4/5    Right Knee Extension  4/5    Left Knee Flexion  4-/5    Left Knee Extension  4-/5                   [facility] Adult PT Treatment/Exercise - 02/24/20 0001      Ambulation/Gait   Assistive device  Straight cane    Gait Pattern  Step-to pattern    Stairs  Yes    Stair Management Technique  One rail Left;Step to pattern    Number of Stairs  --   4 steps up and down 4 x with cane   Height of Stairs  6    Gait Comments  wt shifting side to sid and forward and backwar               PT Short Term Goals - 02/24/20 1811      PT SHORT TERM GOAL #1   Title   Independent with initial HEP    Time  2    Period  Weeks    Status  Achieved    Target Date  02/26/20      PT SHORT TERM GOAL #2   Title  AROM of knee extension -8 to 100 flexion to increase mobility for transitional movements    Baseline  -5 to 103    Time  2    Period  Weeks    Status  Achieved      PT SHORT TERM GOAL #3   Title  Edema of LT knee will decreased from 50cm to 45 cm    Baseline  eval  LT 50 cm RT 43 cm  today 49 cm  RT 43    Time  2    Period  Weeks    Status  On-going    Target Date  02/26/20        PT Long Term Goals - 02/24/20 1812      PT LONG TERM GOAL #1   Title  Pt will be independent with advanced HEP.    Baseline  independent with intial , continuing to advanced    Time  8    Period  Weeks    Status  On-going      PT LONG TERM GOAL #2   Title  Pt will be able to negotiate steps without exacerbation of pain greater than 2/10  and LRAD    Baseline  able to go up and down steps with cane and 3/10 pain 02-24-20    Time  8    Period  Weeks    Status  On-going      PT LONG TERM GOAL #3   Title  Pt will improve her  LT knee flexion to  >/= 120 degrees and extension to </= 5 degrees with </= 2/10 pain for a more functional and efficient gait pattern    Baseline  LT knee -5 ext and 103    Time  8    Period  Weeks    Status  On-going      PT LONG TERM GOAL #4   Title  Pt will be able to perform standing to floor transfer in order to play with kids and to ensure safety in home    Baseline  unable  to perform due to deficit in LT knee bend    Time  8    Period  Weeks    Status  On-going      PT LONG TERM GOAL #5   Title  PT with be able to walk/stand >/= 1 hour with no AD with </= 2/10 pain for functional endurance and return to shopping/leisure activities    Baseline  Pt now using cane but at 3/10 pain    Time  8    Period  Weeks    Status  On-going            Plan - 02/24/20 1817    Clinical Impression Statement  Pt enters clinic with  roling walker but is able to transition to standard cane and was educated and return demo to ascend and descend stairs with cane and proper /safe tecnique.  Pt has continued to have swelling in the knee at 49 cm LT , RT 44 cm for comparison. Pt AROM LT knee +5 ext , 103 flex. Pt would benefit from continued skilled PT in order to perform transfers and get in and out of vehicles with greater ease.  She is limited in flexion and end range TKE.  She has achieved STG #1 adn # 2 but is limited onSTG# 3 due to swelling, she has increased her functional ability utilized least restrictive AD and I expect with continued skilled PT will not need and AD before DC. if she continues to recieve 12 addtional visits.    Comorbidities  DDD, Sciatica, RTC syndrome, plantar fasciitis, carpal tunnel.  OA, obesity,  See medical History, ALLERGIC to Percocet, Hydrocodone,Vicodin, Aleve    Examination-Activity Limitations  Bend;Dressing;Hygiene/Grooming;Toileting;Stand;Stairs;Squat;Sleep;Transfers;Locomotion Level    Stability/Clinical Decision Making  Stable/Uncomplicated    Clinical Decision Making  Moderate    Rehab Potential  Good    PT Frequency  2x / week    PT Duration  8 weeks    PT Treatment/Interventions  Joint Manipulations;ADLs/Self Care Home Management;Cryotherapy;Electrical Stimulation;Ultrasound;Moist Heat;Iontophoresis 4mg /ml Dexamethasone;Gait training;Stair training;Therapeutic activities;Functional mobility training;Therapeutic exercise;Balance training;Patient/family education;Neuromuscular re-education;Manual techniques;Passive range of motion;Vasopneumatic Device;Taping;Dry needling    PT Next Visit Plan  Manual and standing/ strengthening / flexion exericises    PT Home Exercise Plan  Phase 1 TKR exercises, [id number]    Consulted and Agree with Plan of Care  Patient       Patient will benefit from skilled therapeutic intervention in order to improve the following deficits and impairments:  Difficulty  walking, Decreased activity tolerance, Decreased balance, Decreased mobility, Decreased range of motion, Decreased strength, Increased edema, Increased muscle spasms, Postural dysfunction, Improper body mechanics, Pain, Obesity  Visit Diagnosis: Acute pain of left knee  Stiffness of left knee, not elsewhere classified  Muscle weakness (generalized)  Difficulty in walking, not elsewhere classified     Problem List Patient Active Problem List   Diagnosis Date Noted  . Primary osteoarthritis of left  knee 12/29/2019  . Morbid obesity (HCC)   . Bradycardia   . Pulmonary embolism on long-term anticoagulation therapy (HCC) 04/06/2016  . Pain in the chest   . Chronic pain   . Chest wall pain   . Pulmonary embolism (HCC) 07/31/2013  . Vocal cord dysfunction 02/28/2012  . Dyspnea 01/17/2012  . Chest pain 11/25/2011  . Hypokalemia 11/25/2011  . GERD (gastroesophageal reflux disease) 03/08/2011  . DYSMENORRHEA 11/10/2010  . 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 ROTATOR CUFF SYNDROME, RIGHT 04/11/2010  . VITAMIN D DEFICIENCY 02/18/2010  . Bipolar 1 disorder (HCC) 01/17/2010  . Essential hypertension, benign 01/17/2010  . INSOMNIA 01/17/2010  . OBESITY, NOS 01/03/2007  . Depression 01/03/2007  . HEARING LOSS NOS OR DEAFNESS 01/03/2007  . EDEMA-LEGS,DUE TO VENOUS OBSTRUCT. 01/03/2007  . Irritable bowel syndrome 01/03/2007    Jenelle Mages 02/24/2020, 6:27 PM  [facility] [address] [place], [place], [address] Phone: [phone]   Fax:  [phone]  Name: Katie [name] MRN: [id number] Date of Birth: 09/09/1966

## 2020-02-25 DIAGNOSIS — E119 Type 2 diabetes mellitus without complications: Secondary | ICD-10-CM | POA: Diagnosis not present

## 2020-02-26 ENCOUNTER — Ambulatory Visit: Payer: Medicaid Other | Admitting: Physical Therapy

## 2020-02-26 ENCOUNTER — Other Ambulatory Visit: Payer: Self-pay

## 2020-02-26 ENCOUNTER — Encounter: Payer: Self-pay | Admitting: Physical Therapy

## 2020-02-26 DIAGNOSIS — M25562 Pain in left knee: Secondary | ICD-10-CM

## 2020-02-26 DIAGNOSIS — M25662 Stiffness of left knee, not elsewhere classified: Secondary | ICD-10-CM

## 2020-02-26 DIAGNOSIS — R262 Difficulty in walking, not elsewhere classified: Secondary | ICD-10-CM

## 2020-02-26 DIAGNOSIS — E119 Type 2 diabetes mellitus without complications: Secondary | ICD-10-CM | POA: Diagnosis not present

## 2020-02-26 DIAGNOSIS — M6281 Muscle weakness (generalized): Secondary | ICD-10-CM

## 2020-02-26 NOTE — Therapy (Signed)
[facility] [address] [place], [place], [address] Phone: [phone]   Fax:  [phone]  Physical Therapy Treatment  Patient Details  Name: Katie [name] MRN: [id number] Date of Birth: 1966-02-11 Referring Provider (PT): Renaye Rakers MD   Encounter Date: 02/26/2020  PT End of Session - 02/26/20 1510    Visit Number  5    Number of Visits  16    Date for PT Re-Evaluation  04/08/20    Authorization Type  MCD  1st authorization submitted 02-12-20 for 3 visits in 2 weeks reauthorize on 02-24-20 2nd re authorization on 02-24-20    Authorization Time Period  awaiting re-auth    PT Start Time  1504    PT Stop Time  1545    PT Time Calculation (min)  41 min    Activity Tolerance  Patient tolerated treatment well    Behavior During Therapy  [facility] for tasks assessed/performed       Past Medical History:  Diagnosis Date  . Anxiety   . Back pain   . Bipolar 1 disorder (HCC)   . Chronic pain entered 08/15/2015   Treated with Oxycodone  . Depression   . Depression   . Dyspnea   . History of acute bronchitis   . HTN (hypertension)   . OA (osteoarthritis)   . Obesity   . PE (pulmonary embolism)    oct 2014  . Sciatica     Past Surgical History:  Procedure Laterality Date  . ADENOIDECTOMY    . BIOPSY STOMACH    . CARDIAC CATHETERIZATION  2009   normal; in [place]  . CESAREAN SECTION  2002   x 2, 1997 and 2002  . CHOLECYSTECTOMY  2007  . KNEE SURGERY Right 2005  . TONSILLECTOMY    . TOTAL KNEE ARTHROPLASTY Left 01/27/2020   Procedure: TOTAL KNEE ARTHROPLASTY;  Surgeon: [name], MD;  Location: [facility];  Service: Orthopedics;  Laterality: Left;  . TYMPANOSTOMY TUBE PLACEMENT      There were no vitals filed for this visit.  Subjective Assessment - 02/26/20 1509    Subjective  Patient reports her knee is doing well. it just feels a little tight.    Pertinent History  DDD, Sciatica, RTC syndrome, plantar  fasciitis, carpal tunnel.  OA, obesity,  See medical History, ALLERGIC to Percocet, Hydrocodone,Vicodin    Limitations  Standing;Walking;House hold activities;Sitting    How long can you sit comfortably?  20 minutes    How long can you stand comfortably?  10-15    How long can you walk comfortably?  10-15    Diagnostic tests  x ray    Patient Stated Goals  I have 10, 19, 24 children and i want to get back and spend time with them, go to [facility] and exercise, cooking    Currently in Pain?  Yes    Pain Score  3     Pain Location  Knee    Pain Orientation  Left    Pain Descriptors / Indicators  Aching    Pain Type  Surgical pain    Pain Onset  1 to 4 weeks ago    Pain Frequency  Constant    Aggravating Factors   standing and walking    Pain Relieving Factors  rest and ice    Effect of Pain on Daily Activities  difficutly perfroming ADL's  [facility] Adult PT Treatment/Exercise - 02/26/20 0001      Knee/Hip Exercises: Standing   Heel Raises Limitations  weight shifts forward with minmal UE assit x20     Hip Flexion Limitations  standing march 2x10       Knee/Hip Exercises: Supine   Quad Sets Limitations  x15 5 sec hold     Short Arc Quad Sets Limitations  2x15     Straight Leg Raises Limitations  2x10       Vasopneumatic   Number Minutes Vasopneumatic   15 minutes    Vasopnuematic Location   Knee    Vasopneumatic Pressure  Medium    Vasopneumatic Temperature   32 degrees      Manual Therapy   Manual Therapy  Joint mobilization;Edema management    Joint Mobilization  patellar mobs inf/sup med/lateral, flex mobs grade 3 to end range     Passive ROM  knee ext /flex seated on high low table.  using reciprocal inhibition (flexing LT knee, pt extending RT simultaneously,              PT Education - 02/26/20 1510    Education Details  reviewed technique with ther-ex    Person(s) Educated  Patient    Methods  Explanation;Demonstration;Tactile  cues;Verbal cues    Comprehension  Verbalized understanding;Returned demonstration;Verbal cues required;Tactile cues required       PT Short Term Goals - 02/26/20 1712      PT SHORT TERM GOAL #1   Title  Independent with initial HEP    Time  2    Status  Achieved    Target Date  02/26/20      PT SHORT TERM GOAL #2   Title  AROM of knee extension -8 to 100 flexion to increase mobility for transitional movements    Baseline  0-105    Time  2    Period  Weeks    Status  Achieved    Target Date  02/26/20      PT SHORT TERM GOAL #3   Title  Edema of LT knee will decreased from 50cm to 45 cm    Baseline  eval  LT 50 cm RT 43 cm  today 49 cm  RT 43    Time  2    Period  Weeks    Status  On-going    Target Date  02/26/20        PT Long Term Goals - 02/24/20 1812      PT LONG TERM GOAL #1   Title  Pt will be independent with advanced HEP.    Baseline  independent with intial , continuing to advanced    Time  8    Period  Weeks    Status  On-going      PT LONG TERM GOAL #2   Title  Pt will be able to negotiate steps without exacerbation of pain greater than 2/10  and LRAD    Baseline  able to go up and down steps with cane and 3/10 pain 02-24-20    Time  8    Period  Weeks    Status  On-going      PT LONG TERM GOAL #3   Title  Pt will improve her LT knee flexion to  >/= 120 degrees and extension to </= 5 degrees with </= 2/10 pain for a more functional and efficient gait pattern    Baseline  LT knee -5 ext and 103  Time  8    Period  Weeks    Status  On-going      PT LONG TERM GOAL #4   Title  Pt will be able to perform standing to floor transfer in order to play with kids and to ensure safety in home    Baseline  unable  to perform due to deficit in LT knee bend    Time  8    Period  Weeks    Status  On-going      PT LONG TERM GOAL #5   Title  PT with be able to walk/stand >/= 1 hour with no AD with </= 2/10 pain for functional endurance and return to  shopping/leisure activities    Baseline  Pt now using cane but at 3/10 pain    Time  8    Period  Weeks    Status  On-going            Plan - 02/26/20 1515    Clinical Impression Statement  Patient is making great progress. PROM measured at 0-105. She had no increase in pain with treamtnet. She was ggiven standing forward weight shift and standing march for gait training. She tolerated uspine exercisaes well. Therapy will continue to progress as tolerated.    Comorbidities  DDD, Sciatica, RTC syndrome, plantar fasciitis, carpal tunnel.  OA, obesity,  See medical History, ALLERGIC to Percocet, Hydrocodone,Vicodin, Aleve    Examination-Activity Limitations  Bend;Dressing;Hygiene/Grooming;Toileting;Stand;Stairs;Squat;Sleep;Transfers;Locomotion Level    Stability/Clinical Decision Making  Stable/Uncomplicated    Clinical Decision Making  Moderate    Rehab Potential  Good    PT Frequency  2x / week    PT Duration  8 weeks    PT Treatment/Interventions  Joint Manipulations;ADLs/Self Care Home Management;Cryotherapy;Electrical Stimulation;Ultrasound;Moist Heat;Iontophoresis 4mg /ml Dexamethasone;Gait training;Stair training;Therapeutic activities;Functional mobility training;Therapeutic exercise;Balance training;Patient/family education;Neuromuscular re-education;Manual techniques;Passive range of motion;Vasopneumatic Device;Taping;Dry needling    PT Next Visit Plan  Manual and standing/ strengthening / flexion exericises    PT Home Exercise Plan  Phase 1 TKR exercises, [id number]    Consulted and Agree with Plan of Care  Patient       Patient will benefit from skilled therapeutic intervention in order to improve the following deficits and impairments:  Difficulty walking, Decreased activity tolerance, Decreased balance, Decreased mobility, Decreased range of motion, Decreased strength, Increased edema, Increased muscle spasms, Postural dysfunction, Improper body mechanics, Pain, Obesity  Visit  Diagnosis: Acute pain of left knee  Stiffness of left knee, not elsewhere classified  Muscle weakness (generalized)  Difficulty in walking, not elsewhere classified     Problem List Patient Active Problem List   Diagnosis Date Noted  . Primary osteoarthritis of left knee 12/29/2019  . Morbid obesity ([place])   . Bradycardia   . Pulmonary embolism on long-term anticoagulation therapy ([place]) 04/06/2016  . Pain in the chest   . Chronic pain   . Chest wall pain   . Pulmonary embolism ([place]) 07/31/2013  . Vocal cord dysfunction 02/28/2012  . Dyspnea 01/17/2012  . Chest pain 11/25/2011  . Hypokalemia 11/25/2011  . GERD (gastroesophageal reflux disease) 03/08/2011  . DYSMENORRHEA 11/10/2010  . 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 ROTATOR CUFF SYNDROME, RIGHT 04/11/2010  . VITAMIN D DEFICIENCY 02/18/2010  . Bipolar 1 disorder ([place]) 01/17/2010  . Essential hypertension, benign 01/17/2010  . INSOMNIA 01/17/2010  . OBESITY, NOS 01/03/2007  . Depression 01/03/2007  . HEARING LOSS NOS OR DEAFNESS 01/03/2007  . EDEMA-LEGS,DUE TO  VENOUS OBSTRUCT. 01/03/2007  . Irritable bowel syndrome 01/03/2007    Dessie Coma PT DPT  02/26/2020, 5:13 PM  [facility] [address] [place], [place], [address] Phone: [phone]   Fax:  [phone]  Name: Katie [name] MRN: [id number] Date of Birth: 1966-07-10

## 2020-02-27 DIAGNOSIS — E119 Type 2 diabetes mellitus without complications: Secondary | ICD-10-CM | POA: Diagnosis not present

## 2020-02-28 DIAGNOSIS — E119 Type 2 diabetes mellitus without complications: Secondary | ICD-10-CM | POA: Diagnosis not present

## 2020-02-29 DIAGNOSIS — E119 Type 2 diabetes mellitus without complications: Secondary | ICD-10-CM | POA: Diagnosis not present

## 2020-03-01 DIAGNOSIS — E119 Type 2 diabetes mellitus without complications: Secondary | ICD-10-CM | POA: Diagnosis not present

## 2020-03-02 ENCOUNTER — Other Ambulatory Visit: Payer: Self-pay

## 2020-03-02 ENCOUNTER — Ambulatory Visit: Payer: Medicaid Other | Admitting: Physical Therapy

## 2020-03-02 ENCOUNTER — Encounter: Payer: Self-pay | Admitting: Physical Therapy

## 2020-03-02 DIAGNOSIS — M6281 Muscle weakness (generalized): Secondary | ICD-10-CM

## 2020-03-02 DIAGNOSIS — M25562 Pain in left knee: Secondary | ICD-10-CM

## 2020-03-02 DIAGNOSIS — M25662 Stiffness of left knee, not elsewhere classified: Secondary | ICD-10-CM

## 2020-03-02 DIAGNOSIS — R262 Difficulty in walking, not elsewhere classified: Secondary | ICD-10-CM

## 2020-03-02 DIAGNOSIS — E119 Type 2 diabetes mellitus without complications: Secondary | ICD-10-CM | POA: Diagnosis not present

## 2020-03-02 NOTE — Therapy (Signed)
[facility] [address] [place], [place], [address] Phone: [phone]   Fax:  [phone]  Physical Therapy Treatment  Patient Details  Name: Katie [name] MRN: [id number] Date of Birth: [birth year] Referring Provider (PT): Renaye Rakers MD   Encounter Date: 03/02/2020  PT End of Session - 03/02/20 1329    Visit Number  6    Number of Visits  16    Date for PT Re-Evaluation  04/08/20    Authorization Type  MCD  1st authorization submitted 02-12-20 for 3 visits in 2 weeks reauthorize on 02-24-20 2nd re authorization on 02-24-20    PT Start Time  0130    PT Stop Time  0220    PT Time Calculation (min)  50 min    Activity Tolerance  Patient tolerated treatment well    Behavior During Therapy  [place] for tasks assessed/performed       Past Medical History:  Diagnosis Date  . Anxiety   . Back pain   . Bipolar 1 disorder (HCC)   . Chronic pain entered 08/15/2015   Treated with Oxycodone  . Depression   . Depression   . Dyspnea   . History of acute bronchitis   . HTN (hypertension)   . OA (osteoarthritis)   . Obesity   . PE (pulmonary embolism)    oct 2014  . Sciatica     Past Surgical History:  Procedure Laterality Date  . ADENOIDECTOMY    . BIOPSY STOMACH    . CARDIAC CATHETERIZATION  2009   normal; in [place]  . CESAREAN SECTION  2002   x 2, 1997 and 2002  . CHOLECYSTECTOMY  2007  . KNEE SURGERY Right 2005  . TONSILLECTOMY    . TOTAL KNEE ARTHROPLASTY Left 01/27/2020   Procedure: TOTAL KNEE ARTHROPLASTY;  Surgeon: [name], MD;  Location: [facility];  Service: Orthopedics;  Laterality: Left;  . TYMPANOSTOMY TUBE PLACEMENT      There were no vitals filed for this visit.  Subjective Assessment - 03/02/20 1331    Subjective  I take care of my husband since DEc 19 , 2015.  I am able  to clean up and move stuff. I tried to sweep and mop this weekend    Pertinent History  DDD, Sciatica, RTC syndrome, plantar  fasciitis, carpal tunnel.  OA, obesity,  See medical History, ALLERGIC to Percocet, Hydrocodone,Vicodin    Limitations  Standing;Walking;House hold activities;Sitting    How long can you stand comfortably?  10-15    How long can you walk comfortably?  10-15    Diagnostic tests  x ray    Patient Stated Goals  I have 10, 19, 24 children and i want to get back and spend time with them, go to [facility] and exercise, cooking    Pain Score  2     Pain Location  Knee    Pain Orientation  Left    Pain Descriptors / Indicators  Aching    Pain Type  Surgical pain    Pain Onset  1 to 4 weeks ago    Pain Frequency  Intermittent         [facility] PT Assessment - 03/02/20 0001      Circumferential Edema   Circumferential - Right  42.5 cm    Circumferential - Left   44cm      AROM   Left Knee Extension  5    Left Knee Flexion  110  PROM   Left Knee Extension  +3    Left Knee Flexion  114                   [facility] Adult PT Treatment/Exercise - 03/02/20 0001      Ambulation/Gait   Assistive device  Straight cane   able to walk 100 feet without AD and no antalgia after RX   Gait Pattern  Step-through pattern   with VC for arm swing   Ambulation Surface  Level    Gait Comments  wt shifting side to sid and forward and backwar      Knee/Hip Exercises: Stretches   Other Knee/Hip Stretches  standing knee stretch wiht LT foot on chair and leaning into knee flexing stretching      Knee/Hip Exercises: Aerobic   Recumbent Bike  5 minutes . initial minute just trying to make revolution and then spent the rest of time with revolutions      Knee/Hip Exercises: Standing   Hip Flexion Limitations  standing march 2x10     Other Standing Knee Exercises  sink squats 15 x 2 with black t band resisitance around knees      Knee/Hip Exercises: Supine   Quad Sets Limitations  x 20 5 sec hold    Short Arc Quad Sets Limitations  2 x 15    Music therapist Limitations  2 x 10     Straight Leg Raises Limitations  2x15      Knee/Hip Exercises: Sidelying   Hip ABduction  Left;3 sets;5 reps    Hip ABduction Limitations  Pt tends to compensate with hip flexior    Clams  left 2 x 10      Vasopneumatic   Number Minutes Vasopneumatic   --    Vasopnuematic Location   --    Vasopneumatic Pressure  --    Vasopneumatic Temperature   --      Manual Therapy   Manual Therapy  Joint mobilization;Edema management    Joint Mobilization  patellar mobs inf/sup med/lateral, flex mobs grade 3 to end range , use of strap for  PA for prox tibia and distraction and IR grade 3/4     Passive ROM  knee ext /flex seated on high table.  using reciprocal inhibition (flexing LT knee, pt extending RT simultaneously,              PT Education - 03/02/20 1404    Education Details  added hip abduction to strengthening    Person(s) Educated  Patient    Methods  Explanation;Demonstration;Tactile cues;Verbal cues;Handout    Comprehension  Verbalized understanding;Returned demonstration       PT Short Term Goals - 02/26/20 1712      PT SHORT TERM GOAL #1   Title  Independent with initial HEP    Time  2    Status  Achieved    Target Date  02/26/20      PT SHORT TERM GOAL #2   Title  AROM of knee extension -8 to 100 flexion to increase mobility for transitional movements    Baseline  0-105    Time  2    Period  Weeks    Status  Achieved    Target Date  02/26/20      PT SHORT TERM GOAL #3   Title  Edema of LT knee will decreased from 50cm to 45 cm    Baseline  eval  LT  50 cm RT 43 cm  today 49 cm  RT 43    Time  2    Period  Weeks    Status  On-going    Target Date  02/26/20        PT Long Term Goals - 02/24/20 1812      PT LONG TERM GOAL #1   Title  Pt will be independent with advanced HEP.    Baseline  independent with intial , continuing to advanced    Time  8    Period  Weeks    Status  On-going      PT LONG TERM GOAL #2   Title  Pt will be able to negotiate  steps without exacerbation of pain greater than 2/10  and LRAD    Baseline  able to go up and down steps with cane and 3/10 pain 02-24-20    Time  8    Period  Weeks    Status  On-going      PT LONG TERM GOAL #3   Title  Pt will improve her LT knee flexion to  >/= 120 degrees and extension to </= 5 degrees with </= 2/10 pain for a more functional and efficient gait pattern    Baseline  LT knee -5 ext and 103    Time  8    Period  Weeks    Status  On-going      PT LONG TERM GOAL #4   Title  Pt will be able to perform standing to floor transfer in order to play with kids and to ensure safety in home    Baseline  unable  to perform due to deficit in LT knee bend    Time  8    Period  Weeks    Status  On-going      PT LONG TERM GOAL #5   Title  PT with be able to walk/stand >/= 1 hour with no AD with </= 2/10 pain for functional endurance and return to shopping/leisure activities    Baseline  Pt now using cane but at 3/10 pain    Time  8    Period  Weeks    Status  On-going            Plan - 03/02/20 1425    Clinical Impression Statement  Pt today with less swelling in knee form 50 cm on eval to 44 cm today.  Pt able to LT AROM flex 110 PROM 114.  Pt able to walk without antalgic gait. Entered with straight cane and was able to walk out of clinic without any visible limping.  Pt with weak LT hip abduction and added to HEP for strength.    Comorbidities  DDD, Sciatica, RTC syndrome, plantar fasciitis, carpal tunnel.  OA, obesity,  See medical History, ALLERGIC to Percocet, Hydrocodone,Vicodin, Aleve    Examination-Activity Limitations  Bend;Dressing;Hygiene/Grooming;Toileting;Stand;Stairs;Squat;Sleep;Transfers;Locomotion Level    PT Treatment/Interventions  Joint Manipulations;ADLs/Self Care Home Management;Cryotherapy;Electrical Stimulation;Ultrasound;Moist Heat;Iontophoresis 4mg /ml Dexamethasone;Gait training;Stair training;Therapeutic activities;Functional mobility  training;Therapeutic exercise;Balance training;Patient/family education;Neuromuscular re-education;Manual techniques;Passive range of motion;Vasopneumatic Device;Taping;Dry needling    PT Home Exercise Plan  Phase 1 TKR exercises, [id number] hip abduction and bridge and clamshell    Consulted and Agree with Plan of Care  Patient       Patient will benefit from skilled therapeutic intervention in order to improve the following deficits and impairments:  Difficulty walking, Decreased activity tolerance, Decreased balance, Decreased mobility, Decreased range of motion, Decreased strength,  Increased edema, Increased muscle spasms, Postural dysfunction, Improper body mechanics, Pain, Obesity  Visit Diagnosis: Acute pain of left knee  Stiffness of left knee, not elsewhere classified  Muscle weakness (generalized)  Difficulty in walking, not elsewhere classified     Problem List Patient Active Problem List   Diagnosis Date Noted  . Primary osteoarthritis of left knee 12/29/2019  . Morbid obesity ([facility])   . Bradycardia   . Pulmonary embolism on long-term anticoagulation therapy ([facility]) 04/06/2016  . Pain in the chest   . Chronic pain   . Chest wall pain   . Pulmonary embolism ([facility]) 07/31/2013  . Vocal cord dysfunction 02/28/2012  . Dyspnea 01/17/2012  . Chest pain 11/25/2011  . Hypokalemia 11/25/2011  . GERD (gastroesophageal reflux disease) 03/08/2011  . DYSMENORRHEA 11/10/2010  . 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 ROTATOR CUFF SYNDROME, RIGHT 04/11/2010  . VITAMIN D DEFICIENCY 02/18/2010  . Bipolar 1 disorder (HCC) 01/17/2010  . Essential hypertension, benign 01/17/2010  . INSOMNIA 01/17/2010  . OBESITY, NOS 01/03/2007  . Depression 01/03/2007  . HEARING LOSS NOS OR DEAFNESS 01/03/2007  . EDEMA-LEGS,DUE TO VENOUS OBSTRUCT. 01/03/2007  . Irritable bowel syndrome 01/03/2007   Garen Lah, PT Certified Exercise Expert for  the Aging Adult  03/02/20 4:10 PM Phone: [phone] Fax: [phone]  [facility] [address] [place], [place], [address] Phone: [phone]   Fax:  [phone]  Name: Katie [name] MRN: [id number] Date of Birth: [birth year]

## 2020-03-02 NOTE — Patient Instructions (Signed)
   HIP: Abduction / External Rotation (Band)   Place band around knees. Lie on side with hips and knees bent. Raise top knee up, squeezing glutes. Keep feet together. Hold _5__ seconds. Use __red______ band. _10__ reps per set, _3__ sets per day, _6-7__ days per week  Bridge   Lie back, legs bent. Inhale, pressing hips up. Keeping ribs in, lengthen lower back. Exhale, rolling down along spine from top. Repeat _3 x 10___ times. Do _1___ sessions per day.  Garen Lah, PT Certified Exercise Expert for the Aging Adult  03/02/20 2:04 PM Phone: [phone] Fax: [phone]

## 2020-03-03 DIAGNOSIS — E119 Type 2 diabetes mellitus without complications: Secondary | ICD-10-CM | POA: Diagnosis not present

## 2020-03-04 ENCOUNTER — Ambulatory Visit: Payer: Medicaid Other | Admitting: Physical Therapy

## 2020-03-04 ENCOUNTER — Encounter: Payer: Self-pay | Admitting: Physical Therapy

## 2020-03-04 ENCOUNTER — Other Ambulatory Visit: Payer: Self-pay

## 2020-03-04 DIAGNOSIS — M25662 Stiffness of left knee, not elsewhere classified: Secondary | ICD-10-CM

## 2020-03-04 DIAGNOSIS — M25562 Pain in left knee: Secondary | ICD-10-CM

## 2020-03-04 DIAGNOSIS — E119 Type 2 diabetes mellitus without complications: Secondary | ICD-10-CM | POA: Diagnosis not present

## 2020-03-04 DIAGNOSIS — R262 Difficulty in walking, not elsewhere classified: Secondary | ICD-10-CM

## 2020-03-04 DIAGNOSIS — M6281 Muscle weakness (generalized): Secondary | ICD-10-CM

## 2020-03-04 NOTE — Therapy (Signed)
[facility] [address] [place], [place], [address] Phone: [phone]   Fax:  [phone]  Physical Therapy Treatment  Patient Details  Name: Katie [name] MRN: [id number] Date of Birth: 1966-07-11 Referring Provider (PT): Renaye Rakers MD   Encounter Date: 03/04/2020  PT End of Session - 03/04/20 1523    Visit Number  7    Number of Visits  16    Date for PT Re-Evaluation  04/08/20    Authorization Type  MCD  1st authorization submitted 02-12-20 for 3 visits in 2 weeks reauthorize on 02-24-20 2nd re authorization on 02-24-20    Authorization Time Period  awaiting re-auth    PT Start Time  0300    PT Stop Time  0356    PT Time Calculation (min)  56 min    Activity Tolerance  Patient tolerated treatment well    Behavior During Therapy  [facility] for tasks assessed/performed       Past Medical History:  Diagnosis Date  . Anxiety   . Back pain   . Bipolar 1 disorder (HCC)   . Chronic pain entered 08/15/2015   Treated with Oxycodone  . Depression   . Depression   . Dyspnea   . History of acute bronchitis   . HTN (hypertension)   . OA (osteoarthritis)   . Obesity   . PE (pulmonary embolism)    oct 2014  . Sciatica     Past Surgical History:  Procedure Laterality Date  . ADENOIDECTOMY    . BIOPSY STOMACH    . CARDIAC CATHETERIZATION  2009   normal; in [place]  . CESAREAN SECTION  2002   x 2, 1997 and 2002  . CHOLECYSTECTOMY  2007  . KNEE SURGERY Right 2005  . TONSILLECTOMY    . TOTAL KNEE ARTHROPLASTY Left 01/27/2020   Procedure: TOTAL KNEE ARTHROPLASTY;  Surgeon: [name], MD;  Location: [facility];  Service: Orthopedics;  Laterality: Left;  . TYMPANOSTOMY TUBE PLACEMENT      There were no vitals filed for this visit.      [facility] PT Assessment - 03/04/20 0001      AROM   Left Knee Extension  0    Left Knee Flexion  115                   [facility] Adult PT Treatment/Exercise - 03/04/20 0001       Knee/Hip Exercises: Stretches   Other Knee/Hip Stretches  standing knee stretch wiht LT foot on chair and leaning into knee flexing stretching      Knee/Hip Exercises: Aerobic   Recumbent Bike  5 minutes . initial minute just trying to make revolution and then spent the rest of time with revolutions      Knee/Hip Exercises: Standing   Heel Raises Limitations  weight shifts forward with minmal UE assit x20     Hip Flexion Limitations  standing march 2x10     Other Standing Knee Exercises  sink squats 15 x 2 with black t band resisitance around knees      Knee/Hip Exercises: Supine   Quad Sets Limitations  x20 5 sec     Short Arc Quad Sets Limitations  2x15    Music therapist Limitations  2 x 10      Knee/Hip Exercises: Sidelying   Hip ABduction  2 sets;5 reps    Clams  left 2 x 10  Vasopneumatic   Number Minutes Vasopneumatic   15 minutes    Vasopnuematic Location   Knee    Vasopneumatic Pressure  Medium    Vasopneumatic Temperature   32 degrees      Manual Therapy   Manual Therapy  Joint mobilization;Edema management    Joint Mobilization  patellar mobs inf/sup med/lateral, flex mobs grade 3 to end range , use of strap for  PA for prox tibia and distraction and IR grade 3/4     Passive ROM  knee ext /flex seated on high table.  using reciprocal inhibition (flexing LT knee, pt extending RT simultaneously,                PT Short Term Goals - 02/26/20 1712      PT SHORT TERM GOAL #1   Title  Independent with initial HEP    Time  2    Status  Achieved    Target Date  02/26/20      PT SHORT TERM GOAL #2   Title  AROM of knee extension -8 to 100 flexion to increase mobility for transitional movements    Baseline  0-105    Time  2    Period  Weeks    Status  Achieved    Target Date  02/26/20      PT SHORT TERM GOAL #3   Title  Edema of LT knee will decreased from 50cm to 45 cm    Baseline  eval  LT 50 cm RT 43 cm  today 49 cm  RT 43     Time  2    Period  Weeks    Status  On-going    Target Date  02/26/20        PT Long Term Goals - 02/24/20 1812      PT LONG TERM GOAL #1   Title  Pt will be independent with advanced HEP.    Baseline  independent with intial , continuing to advanced    Time  8    Period  Weeks    Status  On-going      PT LONG TERM GOAL #2   Title  Pt will be able to negotiate steps without exacerbation of pain greater than 2/10  and LRAD    Baseline  able to go up and down steps with cane and 3/10 pain 02-24-20    Time  8    Period  Weeks    Status  On-going      PT LONG TERM GOAL #3   Title  Pt will improve her LT knee flexion to  >/= 120 degrees and extension to </= 5 degrees with </= 2/10 pain for a more functional and efficient gait pattern    Baseline  LT knee -5 ext and 103    Time  8    Period  Weeks    Status  On-going      PT LONG TERM GOAL #4   Title  Pt will be able to perform standing to floor transfer in order to play with kids and to ensure safety in home    Baseline  unable  to perform due to deficit in LT knee bend    Time  8    Period  Weeks    Status  On-going      PT LONG TERM GOAL #5   Title  PT with be able to walk/stand >/= 1 hour with no AD with </= 2/10  pain for functional endurance and return to shopping/leisure activities    Baseline  Pt now using cane but at 3/10 pain    Time  8    Period  Weeks    Status  On-going            Plan - 03/04/20 1527    Clinical Impression Statement  Patient tolerated treatment well. She had no singificant increase in pain although she did have some fatigue with ther-ex. She reported minor swelling after treatment. Therapy used vaso to help with swelling. she was advisewd to continue with strengthening over the weekend    Personal Factors and Comorbidities  Comorbidity 1;Comorbidity 2    Comorbidities  DDD, Sciatica, RTC syndrome, plantar fasciitis, carpal tunnel.  OA, obesity,  See medical History, ALLERGIC to  Percocet, Hydrocodone,Vicodin, Aleve    Examination-Activity Limitations  Bend;Dressing;Hygiene/Grooming;Toileting;Stand;Stairs;Squat;Sleep;Transfers;Locomotion Level    Stability/Clinical Decision Making  Stable/Uncomplicated    Clinical Decision Making  Moderate    Rehab Potential  Good    PT Frequency  2x / week    PT Duration  8 weeks    PT Treatment/Interventions  Joint Manipulations;ADLs/Self Care Home Management;Cryotherapy;Electrical Stimulation;Ultrasound;Moist Heat;Iontophoresis 4mg /ml Dexamethasone;Gait training;Stair training;Therapeutic activities;Functional mobility training;Therapeutic exercise;Balance training;Patient/family education;Neuromuscular re-education;Manual techniques;Passive range of motion;Vasopneumatic Device;Taping;Dry needling    PT Next Visit Plan  Manual and standing/ strengthening / flexion exericises    PT Home Exercise Plan  Phase 1 TKR exercises, [id number] hip abduction and bridge and clamshell    Consulted and Agree with Plan of Care  Patient       Patient will benefit from skilled therapeutic intervention in order to improve the following deficits and impairments:  Difficulty walking, Decreased activity tolerance, Decreased balance, Decreased mobility, Decreased range of motion, Decreased strength, Increased edema, Increased muscle spasms, Postural dysfunction, Improper body mechanics, Pain, Obesity  Visit Diagnosis: Acute pain of left knee  Stiffness of left knee, not elsewhere classified  Muscle weakness (generalized)  Difficulty in walking, not elsewhere classified     Problem List Patient Active Problem List   Diagnosis Date Noted  . Primary osteoarthritis of left knee 12/29/2019  . Morbid obesity ([place])   . Bradycardia   . Pulmonary embolism on long-term anticoagulation therapy ([place]) 04/06/2016  . Pain in the chest   . Chronic pain   . Chest wall pain   . Pulmonary embolism ([place]) 07/31/2013  . Vocal cord dysfunction 02/28/2012  .  Dyspnea 01/17/2012  . Chest pain 11/25/2011  . Hypokalemia 11/25/2011  . GERD (gastroesophageal reflux disease) 03/08/2011  . DYSMENORRHEA 11/10/2010  . 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 ROTATOR CUFF SYNDROME, RIGHT 04/11/2010  . VITAMIN D DEFICIENCY 02/18/2010  . Bipolar 1 disorder ([place]) 01/17/2010  . Essential hypertension, benign 01/17/2010  . INSOMNIA 01/17/2010  . OBESITY, NOS 01/03/2007  . Depression 01/03/2007  . HEARING LOSS NOS OR DEAFNESS 01/03/2007  . EDEMA-LEGS,DUE TO VENOUS OBSTRUCT. 01/03/2007  . Irritable bowel syndrome 01/03/2007    Carney Living PT DPT  03/04/2020, 4:22 PM  [facility] [address] [place], [place], [address] Phone: [phone]   Fax:  [phone]  Name: Katie [name] MRN: [id number] Date of Birth: 02-06-1966

## 2020-03-05 DIAGNOSIS — E119 Type 2 diabetes mellitus without complications: Secondary | ICD-10-CM | POA: Diagnosis not present

## 2020-03-06 DIAGNOSIS — E119 Type 2 diabetes mellitus without complications: Secondary | ICD-10-CM | POA: Diagnosis not present

## 2020-03-07 DIAGNOSIS — E119 Type 2 diabetes mellitus without complications: Secondary | ICD-10-CM | POA: Diagnosis not present

## 2020-03-08 DIAGNOSIS — E119 Type 2 diabetes mellitus without complications: Secondary | ICD-10-CM | POA: Diagnosis not present

## 2020-03-09 ENCOUNTER — Encounter: Payer: Self-pay | Admitting: Physical Therapy

## 2020-03-09 ENCOUNTER — Ambulatory Visit: Payer: Medicaid Other | Attending: Orthopedic Surgery | Admitting: Physical Therapy

## 2020-03-09 ENCOUNTER — Other Ambulatory Visit: Payer: Self-pay

## 2020-03-09 DIAGNOSIS — M6281 Muscle weakness (generalized): Secondary | ICD-10-CM | POA: Diagnosis present

## 2020-03-09 DIAGNOSIS — R262 Difficulty in walking, not elsewhere classified: Secondary | ICD-10-CM | POA: Diagnosis present

## 2020-03-09 DIAGNOSIS — M25662 Stiffness of left knee, not elsewhere classified: Secondary | ICD-10-CM | POA: Insufficient documentation

## 2020-03-09 DIAGNOSIS — E119 Type 2 diabetes mellitus without complications: Secondary | ICD-10-CM | POA: Diagnosis not present

## 2020-03-09 DIAGNOSIS — M25562 Pain in left knee: Secondary | ICD-10-CM | POA: Diagnosis not present

## 2020-03-09 NOTE — Therapy (Signed)
[facility] [address] [place], [place], [address] Phone: [phone]   Fax:  [phone]  Physical Therapy Treatment  Patient Details  Name: Katie [name] MRN: [id number] Date of Birth: 1966-04-20 Referring Provider (PT): Renaye Rakers MD   Encounter Date: 03/09/2020  PT End of Session - 03/09/20 1450    Visit Number  8    Number of Visits  16    Date for PT Re-Evaluation  04/08/20    Authorization Type  MCD  1st authorization submitted 02-12-20 for 3 visits in 2 weeks reauthorize on 02-24-20 2nd re authorization on 02-24-20    PT Start Time  1330    PT Stop Time  1420    PT Time Calculation (min)  50 min    Activity Tolerance  Patient tolerated treatment well    Behavior During Therapy  [facility] for tasks assessed/performed       Past Medical History:  Diagnosis Date  . Anxiety   . Back pain   . Bipolar 1 disorder (HCC)   . Chronic pain entered 08/15/2015   Treated with Oxycodone  . Depression   . Depression   . Dyspnea   . History of acute bronchitis   . HTN (hypertension)   . OA (osteoarthritis)   . Obesity   . PE (pulmonary embolism)    oct 2014  . Sciatica     Past Surgical History:  Procedure Laterality Date  . ADENOIDECTOMY    . BIOPSY STOMACH    . CARDIAC CATHETERIZATION  2009   normal; in [place]  . CESAREAN SECTION  2002   x 2, 1997 and 2002  . CHOLECYSTECTOMY  2007  . KNEE SURGERY Right 2005  . TONSILLECTOMY    . TOTAL KNEE ARTHROPLASTY Left 01/27/2020   Procedure: TOTAL KNEE ARTHROPLASTY;  Surgeon: [name], MD;  Location: [facility];  Service: Orthopedics;  Laterality: Left;  . TYMPANOSTOMY TUBE PLACEMENT      There were no vitals filed for this visit.  Subjective Assessment - 03/09/20 1336    Subjective  I take care of my husband, but my knee pain is bad at night with lots of spasms. Yesterday my back was hurting.    Pertinent History  DDD, Sciatica, RTC syndrome, plantar fasciitis,  carpal tunnel.  OA, obesity,  See medical History, ALLERGIC to Percocet, Hydrocodone,Vicodin    Limitations  Standing;Walking;House hold activities;Sitting    Patient Stated Goals  I have 10, 19, 24 children and i want to get back and spend time with them, go to [facility] and exercise, cooking    Currently in Pain?  Yes    Pain Score  4     Pain Location  Knee    Pain Orientation  Left    Pain Descriptors / Indicators  Aching    Pain Type  Surgical pain    Pain Onset  More than a month ago    Pain Frequency  Intermittent         [facility] PT Assessment - 03/09/20 0001      AROM   Left Knee Extension  0    Left Knee Flexion  115                   [facility] Adult PT Treatment/Exercise - 03/09/20 0001      Knee/Hip Exercises: Stretches   Other Knee/Hip Stretches  standing knee stretch wiht LT foot on chair and leaning into knee flexing stretching  Knee/Hip Exercises: Standing   Hip Flexion Limitations  standing march 2x10     Lateral Step Up  Left;1 set;10 reps;Hand Hold: 2    Forward Step Up  Left;1 set;2 sets;10 reps;Hand Hold: 2    Step Down  Left;2 sets;10 reps;Step Height: 2"    Step Down Limitations  left, 2 sets 10 x 4 inch     Other Standing Knee Exercises  in llbars  retro walking and heel toe walking , wt shifting RT to LT and diagonals    Other Standing Knee Exercises  sit to stand with ther ex in chair 1 x 10, then with 10 lb KB arms extended 2x 10    initially uses momentum and hands in front     Knee/Hip Exercises: Supine   Quad Sets Limitations  x20 5 sec      Knee/Hip Exercises: Sidelying   Hip ABduction  2 sets;5 reps    Clams  left 2 x 10      Manual Therapy   Manual Therapy  Joint mobilization;Soft tissue mobilization    Edema Management  soft tissue lymphatic drainage sital to proximal    Soft tissue mobilization  LT quad vastus medialis , intermedialis, RF and lateralis,    Pt with pain on intermedialis, resolved with RX   Passive ROM  knee ext  /flex seated on high table.  using reciprocal inhibition (flexing LT knee, pt extending RT simultaneously,                PT Short Term Goals - 02/26/20 1712      PT SHORT TERM GOAL #1   Title  Independent with initial HEP    Time  2    Status  Achieved    Target Date  02/26/20      PT SHORT TERM GOAL #2   Title  AROM of knee extension -8 to 100 flexion to increase mobility for transitional movements    Baseline  0-105    Time  2    Period  Weeks    Status  Achieved    Target Date  02/26/20      PT SHORT TERM GOAL #3   Title  Edema of LT knee will decreased from 50cm to 45 cm    Baseline  eval  LT 50 cm RT 43 cm  today 49 cm  RT 43    Time  2    Period  Weeks    Status  On-going    Target Date  02/26/20        PT Long Term Goals - 02/24/20 1812      PT LONG TERM GOAL #1   Title  Pt will be independent with advanced HEP.    Baseline  independent with intial , continuing to advanced    Time  8    Period  Weeks    Status  On-going      PT LONG TERM GOAL #2   Title  Pt will be able to negotiate steps without exacerbation of pain greater than 2/10  and LRAD    Baseline  able to go up and down steps with cane and 3/10 pain 02-24-20    Time  8    Period  Weeks    Status  On-going      PT LONG TERM GOAL #3   Title  Pt will improve her LT knee flexion to  >/= 120 degrees and extension to </= 5 degrees  with </= 2/10 pain for a more functional and efficient gait pattern    Baseline  LT knee -5 ext and 103    Time  8    Period  Weeks    Status  On-going      PT LONG TERM GOAL #4   Title  Pt will be able to perform standing to floor transfer in order to play with kids and to ensure safety in home    Baseline  unable  to perform due to deficit in LT knee bend    Time  8    Period  Weeks    Status  On-going      PT LONG TERM GOAL #5   Title  PT with be able to walk/stand >/= 1 hour with no AD with </= 2/10 pain for functional endurance and return to  shopping/leisure activities    Baseline  Pt now using cane but at 3/10 pain    Time  8    Period  Weeks    Status  On-going            Plan - 03/09/20 1443    Clinical Impression Statement  Pt enters clinic with antalgic gait and 4/10 pain.  Pt intial measurement at 110 AROM LT knee flexion but able to achieve 115 at end of RX after manual and ther Ex.  Worked on gait training, and pre gait activities. Pt is used to walking pre TKR and is learning to trust new hard ware and able to walk without limp after RX  will continue to maximize AROM and strength.  Pt trying to do step down on 2 inches without difficulty but more challenged at 4 inches.  Will continue with POC    Comorbidities  DDD, Sciatica, RTC syndrome, plantar fasciitis, carpal tunnel.  OA, obesity,  See medical History, ALLERGIC to Percocet, Hydrocodone,Vicodin, Aleve    Examination-Activity Limitations  Bend;Dressing;Hygiene/Grooming;Toileting;Stand;Stairs;Squat;Sleep;Transfers;Locomotion Level    PT Treatment/Interventions  Joint Manipulations;ADLs/Self Care Home Management;Cryotherapy;Electrical Stimulation;Ultrasound;Moist Heat;Iontophoresis 4mg /ml Dexamethasone;Gait training;Stair training;Therapeutic activities;Functional mobility training;Therapeutic exercise;Balance training;Patient/family education;Neuromuscular re-education;Manual techniques;Passive range of motion;Vasopneumatic Device;Taping;Dry needling    PT Next Visit Plan  Goals  Manual and standing/ strengthening / flexion exericises    PT Home Exercise Plan  Phase 1 TKR exercises, [id number] hip abduction and bridge and clamshell step ups and lateral step ups and step downs    Consulted and Agree with Plan of Care  Patient       Patient will benefit from skilled therapeutic intervention in order to improve the following deficits and impairments:  Difficulty walking, Decreased activity tolerance, Decreased balance, Decreased mobility, Decreased range of motion,  Decreased strength, Increased edema, Increased muscle spasms, Postural dysfunction, Improper body mechanics, Pain, Obesity  Visit Diagnosis: Acute pain of left knee  Stiffness of left knee, not elsewhere classified  Muscle weakness (generalized)  Difficulty in walking, not elsewhere classified     Problem List Patient Active Problem List   Diagnosis Date Noted  . Primary osteoarthritis of left knee 12/29/2019  . Morbid obesity (HCC)   . Bradycardia   . Pulmonary embolism on long-term anticoagulation therapy ([facility]) 04/06/2016  . Pain in the chest   . Chronic pain   . Chest wall pain   . Pulmonary embolism ([facility]) 07/31/2013  . Vocal cord dysfunction 02/28/2012  . Dyspnea 01/17/2012  . Chest pain 11/25/2011  . Hypokalemia 11/25/2011  . GERD (gastroesophageal reflux disease) 03/08/2011  . DYSMENORRHEA 11/10/2010  . 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 ROTATOR CUFF SYNDROME, RIGHT 04/11/2010  . VITAMIN D DEFICIENCY 02/18/2010  . Bipolar 1 disorder (HCC) 01/17/2010  . Essential hypertension, benign 01/17/2010  . INSOMNIA 01/17/2010  . OBESITY, NOS 01/03/2007  . Depression 01/03/2007  . HEARING LOSS NOS OR DEAFNESS 01/03/2007  . EDEMA-LEGS,DUE TO VENOUS OBSTRUCT. 01/03/2007  . Irritable bowel syndrome 01/03/2007    Garen Lah, PT Certified Exercise Expert for the Aging Adult  03/09/20 2:54 PM Phone: [phone] Fax: [phone]  [facility] [address] [place], [place], [address] Phone: [phone]   Fax:  [phone]  Name: SHEREENA BERQUIST MRN: [id number] Date of Birth: Jul 29, 1966

## 2020-03-10 DIAGNOSIS — E119 Type 2 diabetes mellitus without complications: Secondary | ICD-10-CM | POA: Diagnosis not present

## 2020-03-11 ENCOUNTER — Ambulatory Visit: Payer: Medicaid Other | Admitting: Physical Therapy

## 2020-03-11 DIAGNOSIS — E119 Type 2 diabetes mellitus without complications: Secondary | ICD-10-CM | POA: Diagnosis not present

## 2020-03-12 DIAGNOSIS — E119 Type 2 diabetes mellitus without complications: Secondary | ICD-10-CM | POA: Diagnosis not present

## 2020-03-13 DIAGNOSIS — E119 Type 2 diabetes mellitus without complications: Secondary | ICD-10-CM | POA: Diagnosis not present

## 2020-03-14 DIAGNOSIS — E119 Type 2 diabetes mellitus without complications: Secondary | ICD-10-CM | POA: Diagnosis not present

## 2020-03-15 DIAGNOSIS — E119 Type 2 diabetes mellitus without complications: Secondary | ICD-10-CM | POA: Diagnosis not present

## 2020-03-16 ENCOUNTER — Other Ambulatory Visit: Payer: Self-pay

## 2020-03-16 ENCOUNTER — Ambulatory Visit: Payer: Medicaid Other | Admitting: Physical Therapy

## 2020-03-16 ENCOUNTER — Encounter: Payer: Self-pay | Admitting: Physical Therapy

## 2020-03-16 DIAGNOSIS — M6281 Muscle weakness (generalized): Secondary | ICD-10-CM

## 2020-03-16 DIAGNOSIS — M25562 Pain in left knee: Secondary | ICD-10-CM | POA: Diagnosis not present

## 2020-03-16 DIAGNOSIS — E119 Type 2 diabetes mellitus without complications: Secondary | ICD-10-CM | POA: Diagnosis not present

## 2020-03-16 DIAGNOSIS — M25662 Stiffness of left knee, not elsewhere classified: Secondary | ICD-10-CM

## 2020-03-16 DIAGNOSIS — R262 Difficulty in walking, not elsewhere classified: Secondary | ICD-10-CM

## 2020-03-17 ENCOUNTER — Encounter: Payer: Self-pay | Admitting: Physical Therapy

## 2020-03-17 DIAGNOSIS — E119 Type 2 diabetes mellitus without complications: Secondary | ICD-10-CM | POA: Diagnosis not present

## 2020-03-17 NOTE — Therapy (Signed)
[place], [place], [address] Phone: [phone]   Fax:  [phone]  Physical Therapy Treatment  Patient Details  Name: Katie [name] MRN: [id number] Date of Birth: 1966-05-21 Referring Provider (PT): Fredonia Highland MD   Encounter Date: 03/16/2020  PT End of Session - 03/16/20 1450    Visit Number  9    Number of Visits  16    Date for PT Re-Evaluation  04/08/20    Authorization Type  MCD  1st authorization submitted 02-12-20 for 3 visits in 2 weeks reauthorize on 02-24-20 2nd re authorization on 02-24-20    Authorization Time Period  awaiting re-auth    PT Start Time  1415    PT Stop Time  1510    PT Time Calculation (min)  55 min    Activity Tolerance  Patient tolerated treatment well    Behavior During Therapy  [facility] for tasks assessed/performed       Past Medical History:  Diagnosis Date  . Anxiety   . Back pain   . Bipolar 1 disorder ([place])   . Chronic pain entered 08/15/2015   Treated with Oxycodone  . Depression   . Depression   . Dyspnea   . History of acute bronchitis   . HTN (hypertension)   . OA (osteoarthritis)   . Obesity   . PE (pulmonary embolism)    oct 2014  . Sciatica     Past Surgical History:  Procedure Laterality Date  . ADENOIDECTOMY    . BIOPSY STOMACH    . CARDIAC CATHETERIZATION  2009   normal; in [place]  . CESAREAN SECTION  2002   x 2, 1997 and 2002  . CHOLECYSTECTOMY  2007  . KNEE SURGERY Right 2005  . TONSILLECTOMY    . TOTAL KNEE ARTHROPLASTY Left 01/27/2020   Procedure: TOTAL KNEE ARTHROPLASTY;  Surgeon: Renette Butters, MD;  Location: [facility];  Service: Orthopedics;  Laterality: Left;  . TYMPANOSTOMY TUBE PLACEMENT      There were no vitals filed for this visit.  Subjective Assessment - 03/16/20 1421    Subjective  Patient reports she has her good days and bad days. It was a little stiff this morning. her pain right now is about a 2/10.    Pertinent  History  DDD, Sciatica, RTC syndrome, plantar fasciitis, carpal tunnel.  OA, obesity,  See medical History, ALLERGIC to Percocet, Hydrocodone,Vicodin    Limitations  Standing;Walking;House hold activities;Sitting    How long can you sit comfortably?  20 minutes    How long can you stand comfortably?  10-15    How long can you walk comfortably?  10-15    Diagnostic tests  x ray    Patient Stated Goals  I have 10, 19, 24 children and i want to get back and spend time with them, go to [facility] and exercise, cooking    Currently in Pain?  Yes    Pain Score  2     Pain Location  Neck    Pain Orientation  Left    Pain Descriptors / Indicators  Aching    Pain Type  Chronic pain    Pain Onset  More than a month ago    Pain Frequency  Intermittent    Aggravating Factors   standing and walking    Pain Relieving Factors  rest and ice    Effect of Pain on Daily Activities  difficulty perfroming ADL's  [facility] Adult PT Treatment/Exercise - 03/17/20 0001      Knee/Hip Exercises: Standing   Heel Raises Limitations  x20    Hip Flexion Limitations  standing march 2x10     Lateral Step Up  Left;1 set;Hand Hold: 2;15 reps;Step Height: 4"    Forward Step Up  Left;1 set;15 reps;Hand Hold: 1;Step Height: 4"    Wall Squat Limitations  x15      Knee/Hip Exercises: Supine   Quad Sets Limitations  x20 5 sec     Short Arc Quad Sets Limitations  x20     Straight Leg Raises Limitations  2x15 left       Vasopneumatic   Number Minutes Vasopneumatic   15 minutes    Vasopnuematic Location   Knee    Vasopneumatic Pressure  Medium    Vasopneumatic Temperature   32 degrees      Manual Therapy   Manual Therapy  Joint mobilization;Soft tissue mobilization    Edema Management  soft tissue lymphatic drainage sital to proximal    Soft tissue mobilization  LT quad vastus medialis , intermedialis, RF and lateralis,     Passive ROM  knee  flex              PT Education -  03/16/20 1449    Education Details  reviewed technique with ther-ex    Person(s) Educated  Patient    Methods  Explanation;Demonstration    Comprehension  Returned demonstration;Verbal cues required;Verbalized understanding;Tactile cues required       PT Short Term Goals - 03/17/20 0824      PT SHORT TERM GOAL #1   Title  Independent with initial HEP    Time  2    Period  Weeks    Status  Achieved    Target Date  02/26/20      PT SHORT TERM GOAL #2   Title  AROM of knee extension -8 to 100 flexion to increase mobility for transitional movements    Baseline  0-105    Time  2    Period  Weeks    Status  Achieved    Target Date  02/26/20      PT SHORT TERM GOAL #3   Title  Edema of LT knee will decreased from 50cm to 45 cm    Baseline  eval  LT 50 cm RT 43 cm  today 49 cm  RT 43    Time  2    Status  On-going    Target Date  02/26/20        PT Long Term Goals - 02/24/20 1812      PT LONG TERM GOAL #1   Title  Pt will be independent with advanced HEP.    Baseline  independent with intial , continuing to advanced    Time  8    Period  Weeks    Status  On-going      PT LONG TERM GOAL #2   Title  Pt will be able to negotiate steps without exacerbation of pain greater than 2/10  and LRAD    Baseline  able to go up and down steps with cane and 3/10 pain 02-24-20    Time  8    Period  Weeks    Status  On-going      PT LONG TERM GOAL #3   Title  Pt will improve her LT knee flexion to  >/= 120 degrees and extension to </= 5 degrees  with </= 2/10 pain for a more functional and efficient gait pattern    Baseline  LT knee -5 ext and 103    Time  8    Period  Weeks    Status  On-going      PT LONG TERM GOAL #4   Title  Pt will be able to perform standing to floor transfer in order to play with kids and to ensure safety in home    Baseline  unable  to perform due to deficit in LT knee bend    Time  8    Period  Weeks    Status  On-going      PT LONG TERM GOAL #5    Title  PT with be able to walk/stand >/= 1 hour with no AD with </= 2/10 pain for functional endurance and return to shopping/leisure activities    Baseline  Pt now using cane but at 3/10 pain    Time  8    Period  Weeks    Status  On-going            Plan - 03/17/20 0818    Clinical Impression Statement  Patient was stiff at first but after manual therapy the patient reached 115 degrees of flexion. She tolerated standing ther-ex well. she reported sitffness after ther-ex. Therapy applied vaso-pneumativc device to reduce swelling. therapy willc continue to advance as tolerated.    Personal Factors and Comorbidities  Comorbidity 1;Comorbidity 2    Comorbidities  DDD, Sciatica, RTC syndrome, plantar fasciitis, carpal tunnel.  OA, obesity,  See medical History, ALLERGIC to Percocet, Hydrocodone,Vicodin, Aleve    Examination-Activity Limitations  Bend;Dressing;Hygiene/Grooming;Toileting;Stand;Stairs;Squat;Sleep;Transfers;Locomotion Level    Stability/Clinical Decision Making  Stable/Uncomplicated    Clinical Decision Making  Moderate    Rehab Potential  Good    PT Frequency  2x / week    PT Duration  8 weeks    PT Treatment/Interventions  Joint Manipulations;ADLs/Self Care Home Management;Cryotherapy;Electrical Stimulation;Ultrasound;Moist Heat;Iontophoresis 4mg /ml Dexamethasone;Gait training;Stair training;Therapeutic activities;Functional mobility training;Therapeutic exercise;Balance training;Patient/family education;Neuromuscular re-education;Manual techniques;Passive range of motion;Vasopneumatic Device;Taping;Dry needling    PT Next Visit Plan  continue to work on manual therapy as needed. Progress standing activity as tolerated.    PT Home Exercise Plan  Phase 1 TKR exercises, [id number] hip abduction and bridge and clamshell step ups and lateral step ups and step downs    Consulted and Agree with Plan of Care  Patient       Patient will benefit from skilled therapeutic intervention in  order to improve the following deficits and impairments:  Difficulty walking, Decreased activity tolerance, Decreased balance, Decreased mobility, Decreased range of motion, Decreased strength, Increased edema, Increased muscle spasms, Postural dysfunction, Improper body mechanics, Pain, Obesity  Visit Diagnosis: Acute pain of left knee  Stiffness of left knee, not elsewhere classified  Muscle weakness (generalized)  Difficulty in walking, not elsewhere classified     Problem List Patient Active Problem List   Diagnosis Date Noted  . Primary osteoarthritis of left knee 12/29/2019  . Morbid obesity ([facility])   . Bradycardia   . Pulmonary embolism on long-term anticoagulation therapy ([facility]) 04/06/2016  . Pain in the chest   . Chronic pain   . Chest wall pain   . Pulmonary embolism ([facility]) 07/31/2013  . Vocal cord dysfunction 02/28/2012  . Dyspnea 01/17/2012  . Chest pain 11/25/2011  . Hypokalemia 11/25/2011  . GERD (gastroesophageal reflux disease) 03/08/2011  . DYSMENORRHEA 11/10/2010  . 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 ROTATOR CUFF SYNDROME, RIGHT 04/11/2010  . VITAMIN D DEFICIENCY 02/18/2010  . Bipolar 1 disorder ([facility]) 01/17/2010  . Essential hypertension, benign 01/17/2010  . INSOMNIA 01/17/2010  . OBESITY, NOS 01/03/2007  . Depression 01/03/2007  . HEARING LOSS NOS OR DEAFNESS 01/03/2007  . EDEMA-LEGS,DUE TO VENOUS OBSTRUCT. 01/03/2007  . Irritable bowel syndrome 01/03/2007    Dessie Coma PT DPT  03/17/2020, 8:25 AM  [facility] [address] [place], [place], [address] Phone: [phone]   Fax:  [phone]  Name: CLEATUS GOODIN MRN: [id number] Date of Birth: 1966/02/29

## 2020-03-18 ENCOUNTER — Ambulatory Visit: Payer: Medicaid Other | Admitting: Physical Therapy

## 2020-03-18 ENCOUNTER — Other Ambulatory Visit: Payer: Self-pay

## 2020-03-18 ENCOUNTER — Encounter: Payer: Self-pay | Admitting: Physical Therapy

## 2020-03-18 DIAGNOSIS — M25562 Pain in left knee: Secondary | ICD-10-CM

## 2020-03-18 DIAGNOSIS — M6281 Muscle weakness (generalized): Secondary | ICD-10-CM

## 2020-03-18 DIAGNOSIS — E119 Type 2 diabetes mellitus without complications: Secondary | ICD-10-CM | POA: Diagnosis not present

## 2020-03-18 DIAGNOSIS — R262 Difficulty in walking, not elsewhere classified: Secondary | ICD-10-CM

## 2020-03-18 DIAGNOSIS — M25662 Stiffness of left knee, not elsewhere classified: Secondary | ICD-10-CM

## 2020-03-18 NOTE — Therapy (Signed)
[facility] [address] [place], [place], [address] Phone: [phone]   Fax:  [phone]  Physical Therapy Treatment  Patient Details  Name: Katie [name] MRN: [id number] Date of Birth: 1966/09/17 Referring Provider (PT): Renaye Rakers MD   Encounter Date: 03/18/2020  PT End of Session - 03/18/20 1458    Visit Number  10    Number of Visits  16    Date for PT Re-Evaluation  04/08/20    Authorization Type  MCD  1st authorization submitted 02-12-20 for 3 visits in 2 weeks reauthorize on 02-24-20 2nd re authorization on 02-24-20    Authorization Time Period  awaiting re-auth    PT Start Time  1424   Patient 9 minutes late   PT Stop Time  1507    PT Time Calculation (min)  43 min    Activity Tolerance  Patient tolerated treatment well    Behavior During Therapy  [facility] for tasks assessed/performed       Past Medical History:  Diagnosis Date  . Anxiety   . Back pain   . Bipolar 1 disorder ([facility])   . Chronic pain entered 08/15/2015   Treated with Oxycodone  . Depression   . Depression   . Dyspnea   . History of acute bronchitis   . HTN (hypertension)   . OA (osteoarthritis)   . Obesity   . PE (pulmonary embolism)    oct 2014  . Sciatica     Past Surgical History:  Procedure Laterality Date  . ADENOIDECTOMY    . BIOPSY STOMACH    . CARDIAC CATHETERIZATION  2009   normal; in [place]  . CESAREAN SECTION  2002   x 2, 1997 and 2002  . CHOLECYSTECTOMY  2007  . KNEE SURGERY Right 2005  . TONSILLECTOMY    . TOTAL KNEE ARTHROPLASTY Left 01/27/2020   Procedure: TOTAL KNEE ARTHROPLASTY;  Surgeon: [name], MD;  Location: [facility];  Service: Orthopedics;  Laterality: Left;  . TYMPANOSTOMY TUBE PLACEMENT      There were no vitals filed for this visit.  Subjective Assessment - 03/18/20 1452    Subjective  Patients knee ROM made a great jump. Her knee flexion ROM was measured at 121 today. She had no increase in pain  with ther-ex. She reported some buckling from time to time. Therapy added ther=-ex step for muscle activation and speed.    Pertinent History  DDD, Sciatica, RTC syndrome, plantar fasciitis, carpal tunnel.  OA, obesity,  See medical History, ALLERGIC to Percocet, Hydrocodone,Vicodin    Limitations  Standing;Walking;House hold activities;Sitting    How long can you sit comfortably?  20 minutes    How long can you stand comfortably?  10-15    How long can you walk comfortably?  10-15    Diagnostic tests  x ray    Patient Stated Goals  I have 10, 19, 24 children and i want to get back and spend time with them, go to [facility] and exercise, cooking    Currently in Pain?  Yes    Pain Score  1     Pain Location  Knee    Pain Orientation  Left    Pain Descriptors / Indicators  Aching    Pain Type  Chronic pain    Pain Onset  More than a month ago    Pain Frequency  Intermittent    Aggravating Factors   standing and walking    Pain  Relieving Factors  rest and ice    Effect of Pain on Daily Activities  difficulty perfroming ADL's         [facility] PT Assessment - 03/18/20 0001      AROM   Left Knee Flexion  121                    [facility] Adult PT Treatment/Exercise - 03/18/20 0001      Knee/Hip Exercises: Standing   Heel Raises Limitations  x20    Hip Flexion Limitations  standing march 2x10     Lateral Step Up  Left;1 set;Hand Hold: 2;15 reps;Step Height: 4"    Forward Step Up  Left;1 set;15 reps;Hand Hold: 1;Step Height: 4"    Functional Squat Limitations  x15    Other Standing Knee Exercises  step onto air-ex fowd and side 2x10       Knee/Hip Exercises: Supine   Quad Sets Limitations  x20 5 sec hold     Short Arc Quad Sets Limitations  x20     Bridges Limitations  x20     Straight Leg Raises Limitations  2x15       Manual Therapy   Manual Therapy  Joint mobilization;Soft tissue mobilization    Edema Management  soft tissue lymphatic drainage sital to proximal    Soft  tissue mobilization  LT quad vastus medialis , intermedialis, RF and lateralis,     Passive ROM  knee  flex              PT Education - 03/18/20 1457    Education Details  progression of activity    Person(s) Educated  Patient    Methods  Explanation;Demonstration;Tactile cues;Verbal cues    Comprehension  Verbalized understanding;Returned demonstration;Verbal cues required;Tactile cues required       PT Short Term Goals - 03/18/20 1541      PT SHORT TERM GOAL #1   Title  Independent with initial HEP    Time  2    Period  Weeks    Status  Achieved    Target Date  02/26/20      PT SHORT TERM GOAL #2   Title  AROM of knee extension -8 to 100 flexion to increase mobility for transitional movements    Baseline  0-105    Time  2    Period  Weeks    Status  Achieved    Target Date  02/26/20      PT SHORT TERM GOAL #3   Title  Edema of LT knee will decreased from 50cm to 45 cm    Baseline  eval  LT 50 cm RT 43 cm  today 49 cm  RT 43    Time  2    Period  Weeks    Status  On-going    Target Date  02/26/20        PT Long Term Goals - 02/24/20 1812      PT LONG TERM GOAL #1   Title  Pt will be independent with advanced HEP.    Baseline  independent with intial , continuing to advanced    Time  8    Period  Weeks    Status  On-going      PT LONG TERM GOAL #2   Title  Pt will be able to negotiate steps without exacerbation of pain greater than 2/10  and LRAD    Baseline  able to go up and down  steps with cane and 3/10 pain 02-24-20    Time  8    Period  Weeks    Status  On-going      PT LONG TERM GOAL #3   Title  Pt will improve her LT knee flexion to  >/= 120 degrees and extension to </= 5 degrees with </= 2/10 pain for a more functional and efficient gait pattern    Baseline  LT knee -5 ext and 103    Time  8    Period  Weeks    Status  On-going      PT LONG TERM GOAL #4   Title  Pt will be able to perform standing to floor transfer in order to play with  kids and to ensure safety in home    Baseline  unable  to perform due to deficit in LT knee bend    Time  8    Period  Weeks    Status  On-going      PT LONG TERM GOAL #5   Title  PT with be able to walk/stand >/= 1 hour with no AD with </= 2/10 pain for functional endurance and return to shopping/leisure activities    Baseline  Pt now using cane but at 3/10 pain    Time  8    Period  Weeks    Status  On-going            Plan - 03/18/20 1539    Clinical Impression Statement  Patients passive knee flexion was measured at 121 degrees. She tolerated treatment well. she deffered ice. She was given step onto air-ex. She reports temporary buckling. She was given air-ex to speed up her muscle firing. She tolerated other ther-ex well. Therapy will continue to advance as tolerated.    Personal Factors and Comorbidities  Comorbidity 1;Comorbidity 2    Comorbidities  DDD, Sciatica, RTC syndrome, plantar fasciitis, carpal tunnel.  OA, obesity,  See medical History, ALLERGIC to Percocet, Hydrocodone,Vicodin, Aleve    Examination-Activity Limitations  Bend;Dressing;Hygiene/Grooming;Toileting;Stand;Stairs;Squat;Sleep;Transfers;Locomotion Level    Stability/Clinical Decision Making  Stable/Uncomplicated    Clinical Decision Making  Moderate    Rehab Potential  Good    PT Frequency  2x / week    PT Duration  8 weeks    PT Treatment/Interventions  Joint Manipulations;ADLs/Self Care Home Management;Cryotherapy;Electrical Stimulation;Ultrasound;Moist Heat;Iontophoresis 4mg /ml Dexamethasone;Gait training;Stair training;Therapeutic activities;Functional mobility training;Therapeutic exercise;Balance training;Patient/family education;Neuromuscular re-education;Manual techniques;Passive range of motion;Vasopneumatic Device;Taping;Dry needling    PT Next Visit Plan  continue to work on manual therapy as needed. Progress standing activity as tolerated.    PT Home Exercise Plan  Phase 1 TKR exercises, [id number]  hip abduction and bridge and clamshell step ups and lateral step ups and step downs    Consulted and Agree with Plan of Care  Patient       Patient will benefit from skilled therapeutic intervention in order to improve the following deficits and impairments:  Difficulty walking, Decreased activity tolerance, Decreased balance, Decreased mobility, Decreased range of motion, Decreased strength, Increased edema, Increased muscle spasms, Postural dysfunction, Improper body mechanics, Pain, Obesity  Visit Diagnosis: Acute pain of left knee  Stiffness of left knee, not elsewhere classified  Muscle weakness (generalized)  Difficulty in walking, not elsewhere classified     Problem List Patient Active Problem List   Diagnosis Date Noted  . Primary osteoarthritis of left knee 12/29/2019  . Morbid obesity ([facility])   . Bradycardia   . Pulmonary embolism on long-term  anticoagulation therapy ([place]) 04/06/2016  . Pain in the chest   . Chronic pain   . Chest wall pain   . Pulmonary embolism ([place]) 07/31/2013  . Vocal cord dysfunction 02/28/2012  . Dyspnea 01/17/2012  . Chest pain 11/25/2011  . Hypokalemia 11/25/2011  . GERD (gastroesophageal reflux disease) 03/08/2011  . DYSMENORRHEA 11/10/2010  . 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 ROTATOR CUFF SYNDROME, RIGHT 04/11/2010  . VITAMIN D DEFICIENCY 02/18/2010  . Bipolar 1 disorder ([place]) 01/17/2010  . Essential hypertension, benign 01/17/2010  . INSOMNIA 01/17/2010  . OBESITY, NOS 01/03/2007  . Depression 01/03/2007  . HEARING LOSS NOS OR DEAFNESS 01/03/2007  . EDEMA-LEGS,DUE TO VENOUS OBSTRUCT. 01/03/2007  . Irritable bowel syndrome 01/03/2007    Carney Living PT DPT  03/18/2020, 3:42 PM  [facility] [address] [place], [place], [address] Phone: [phone]   Fax:  [phone]  Name: Katie [name] MRN: [id number] Date of Birth:  20-Oct-1966

## 2020-03-19 DIAGNOSIS — E119 Type 2 diabetes mellitus without complications: Secondary | ICD-10-CM | POA: Diagnosis not present

## 2020-03-20 DIAGNOSIS — E119 Type 2 diabetes mellitus without complications: Secondary | ICD-10-CM | POA: Diagnosis not present

## 2020-03-21 DIAGNOSIS — E119 Type 2 diabetes mellitus without complications: Secondary | ICD-10-CM | POA: Diagnosis not present

## 2020-03-22 ENCOUNTER — Encounter: Payer: Self-pay | Admitting: Physical Therapy

## 2020-03-22 ENCOUNTER — Other Ambulatory Visit: Payer: Self-pay

## 2020-03-22 ENCOUNTER — Ambulatory Visit: Payer: Medicaid Other | Admitting: Physical Therapy

## 2020-03-22 DIAGNOSIS — M25562 Pain in left knee: Secondary | ICD-10-CM | POA: Diagnosis not present

## 2020-03-22 DIAGNOSIS — R262 Difficulty in walking, not elsewhere classified: Secondary | ICD-10-CM

## 2020-03-22 DIAGNOSIS — M25662 Stiffness of left knee, not elsewhere classified: Secondary | ICD-10-CM

## 2020-03-22 DIAGNOSIS — M6281 Muscle weakness (generalized): Secondary | ICD-10-CM

## 2020-03-22 DIAGNOSIS — E119 Type 2 diabetes mellitus without complications: Secondary | ICD-10-CM | POA: Diagnosis not present

## 2020-03-22 NOTE — Therapy (Signed)
[facility] [address] [place], [place], [address] Phone: [phone]   Fax:  [phone]  Physical Therapy Treatment  Patient Details  Name: Katie [name] MRN: [id number] Date of Birth: [birth year] Referring Provider (PT): Renaye Rakers MD   Encounter Date: 03/22/2020  PT End of Session - 03/22/20 1531    Visit Number  11    Number of Visits  16    Date for PT Re-Evaluation  04/08/20    Authorization Type  MCD  1st authorization submitted 02-12-20 for 3 visits in 2 weeks reauthorize on 02-24-20 2nd re authorization on 02-24-20    PT Start Time  1415    PT Stop Time  1509    PT Time Calculation (min)  54 min    Activity Tolerance  Patient tolerated treatment well    Behavior During Therapy  [facility] for tasks assessed/performed       Past Medical History:  Diagnosis Date  . Anxiety   . Back pain   . Bipolar 1 disorder (HCC)   . Chronic pain entered 08/15/2015   Treated with Oxycodone  . Depression   . Depression   . Dyspnea   . History of acute bronchitis   . HTN (hypertension)   . OA (osteoarthritis)   . Obesity   . PE (pulmonary embolism)    oct 2014  . Sciatica     Past Surgical History:  Procedure Laterality Date  . ADENOIDECTOMY    . BIOPSY STOMACH    . CARDIAC CATHETERIZATION  2009   normal; in [place]  . CESAREAN SECTION  2002   x 2, 1997 and 2002  . CHOLECYSTECTOMY  2007  . KNEE SURGERY Right 2005  . TONSILLECTOMY    . TOTAL KNEE ARTHROPLASTY Left 01/27/2020   Procedure: TOTAL KNEE ARTHROPLASTY;  Surgeon: [name], MD;  Location: [facility];  Service: Orthopedics;  Laterality: Left;  . TYMPANOSTOMY TUBE PLACEMENT      There were no vitals filed for this visit.  Subjective Assessment - 03/22/20 1422    Subjective  Patient was walking yesterday and felt some pain in her lateral knee. She flet a little pop. She feels like it is swelling. She feels a sharp pain when she presses in her lateral quad.     Pertinent History  DDD, Sciatica, RTC syndrome, plantar fasciitis, carpal tunnel.  OA, obesity,  See medical History, ALLERGIC to Percocet, Hydrocodone,Vicodin    Limitations  Standing;Walking;House hold activities;Sitting    How long can you sit comfortably?  20 minutes    How long can you stand comfortably?  10-15    How long can you walk comfortably?  10-15    Diagnostic tests  x ray    Patient Stated Goals  I have 10, 19, 24 children and i want to get back and spend time with them, go to [facility] and exercise, cooking    Currently in Pain?  Yes    Pain Score  6     Pain Location  Knee    Pain Orientation  Left    Pain Descriptors / Indicators  Aching    Pain Type  Chronic pain    Pain Onset  More than a month ago    Pain Frequency  Intermittent    Aggravating Factors   Standing and walking    Pain Relieving Factors  rest, ice    Effect of Pain on Daily Activities  difficulty perfroming ADL;s  [facility] Adult PT Treatment/Exercise - 03/22/20 0001      Knee/Hip Exercises: Aerobic   Nustep  5 min       Knee/Hip Exercises: Supine   Quad Sets Limitations  x20 5 sec hold     Straight Leg Raises Limitations  2x10       Vasopneumatic   Number Minutes Vasopneumatic   15 minutes    Vasopnuematic Location   Knee    Vasopneumatic Pressure  Medium    Vasopneumatic Temperature   32 degrees      Manual Therapy   Manual therapy comments  patellar mobilization. Painful with inferio/superior glide    Edema Management  soft tissue lymphatic drainage distal to proximal    Soft tissue mobilization  foucused on lateralis     Passive ROM  knee  flex              PT Education - 03/22/20 1425    Education Details  reviewed RICE for symptom management    Person(s) Educated  Patient    Methods  Explanation;Demonstration;Tactile cues;Verbal cues    Comprehension  Verbalized understanding;Verbal cues required;Tactile cues required;Returned demonstration        PT Short Term Goals - 03/18/20 1541      PT SHORT TERM GOAL #1   Title  Independent with initial HEP    Time  2    Period  Weeks    Status  Achieved    Target Date  02/26/20      PT SHORT TERM GOAL #2   Title  AROM of knee extension -8 to 100 flexion to increase mobility for transitional movements    Baseline  0-105    Time  2    Period  Weeks    Status  Achieved    Target Date  02/26/20      PT SHORT TERM GOAL #3   Title  Edema of LT knee will decreased from 50cm to 45 cm    Baseline  eval  LT 50 cm RT 43 cm  today 49 cm  RT 43    Time  2    Period  Weeks    Status  On-going    Target Date  02/26/20        PT Long Term Goals - 02/24/20 1812      PT LONG TERM GOAL #1   Title  Pt will be independent with advanced HEP.    Baseline  independent with intial , continuing to advanced    Time  8    Period  Weeks    Status  On-going      PT LONG TERM GOAL #2   Title  Pt will be able to negotiate steps without exacerbation of pain greater than 2/10  and LRAD    Baseline  able to go up and down steps with cane and 3/10 pain 02-24-20    Time  8    Period  Weeks    Status  On-going      PT LONG TERM GOAL #3   Title  Pt will improve her LT knee flexion to  >/= 120 degrees and extension to </= 5 degrees with </= 2/10 pain for a more functional and efficient gait pattern    Baseline  LT knee -5 ext and 103    Time  8    Period  Weeks    Status  On-going      PT LONG TERM GOAL #4  Title  Pt will be able to perform standing to floor transfer in order to play with kids and to ensure safety in home    Baseline  unable  to perform due to deficit in LT knee bend    Time  8    Period  Weeks    Status  On-going      PT LONG TERM GOAL #5   Title  PT with be able to walk/stand >/= 1 hour with no AD with </= 2/10 pain for functional endurance and return to shopping/leisure activities    Baseline  Pt now using cane but at 3/10 pain    Time  8    Period  Weeks    Status   On-going            Plan - 03/22/20 1511    Clinical Impression Statement  Patient had rednerness to palpation in her mid lateral quad and tightness in that area. She presents like an acute quade strain althogh her MOI dose not seem to be enough to cause that type of strain. Therapy focused on manual therapy for edema mangement and manual therapy to rreduce inflamation. She also perfromed light ther-ex. The patient will be dseen again tomorrow. hpefully her pain has improved by that point and we will get back into exercises.    Personal Factors and Comorbidities  Comorbidity 1;Comorbidity 2    Comorbidities  DDD, Sciatica, RTC syndrome, plantar fasciitis, carpal tunnel.  OA, obesity,  See medical History, ALLERGIC to Percocet, Hydrocodone,Vicodin, Aleve    Examination-Activity Limitations  Bend;Dressing;Hygiene/Grooming;Toileting;Stand;Stairs;Squat;Sleep;Transfers;Locomotion Level    Stability/Clinical Decision Making  Stable/Uncomplicated    Clinical Decision Making  Moderate    Rehab Potential  Good    PT Frequency  2x / week    PT Duration  8 weeks    PT Treatment/Interventions  Joint Manipulations;ADLs/Self Care Home Management;Cryotherapy;Electrical Stimulation;Ultrasound;Moist Heat;Iontophoresis 4mg /ml Dexamethasone;Gait training;Stair training;Therapeutic activities;Functional mobility training;Therapeutic exercise;Balance training;Patient/family education;Neuromuscular re-education;Manual techniques;Passive range of motion;Vasopneumatic Device;Taping;Dry needling    PT Next Visit Plan  continue to work on manual therapy as needed. Progress standing activity as tolerated.    PT Home Exercise Plan  Phase 1 TKR exercises, [id number] hip abduction and bridge and clamshell step ups and lateral step ups and step downs    Consulted and Agree with Plan of Care  Patient       Patient will benefit from skilled therapeutic intervention in order to improve the following deficits and impairments:   Difficulty walking, Decreased activity tolerance, Decreased balance, Decreased mobility, Decreased range of motion, Decreased strength, Increased edema, Increased muscle spasms, Postural dysfunction, Improper body mechanics, Pain, Obesity  Visit Diagnosis: Acute pain of left knee  Stiffness of left knee, not elsewhere classified  Muscle weakness (generalized)  Difficulty in walking, not elsewhere classified     Problem List Patient Active Problem List   Diagnosis Date Noted  . Primary osteoarthritis of left knee 12/29/2019  . Morbid obesity (HCC)   . Bradycardia   . Pulmonary embolism on long-term anticoagulation therapy (HCC) 04/06/2016  . Pain in the chest   . Chronic pain   . Chest wall pain   . Pulmonary embolism (HCC) 07/31/2013  . Vocal cord dysfunction 02/28/2012  . Dyspnea 01/17/2012  . Chest pain 11/25/2011  . Hypokalemia 11/25/2011  . GERD (gastroesophageal reflux disease) 03/08/2011  . DYSMENORRHEA 11/10/2010  . 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 ROTATOR CUFF SYNDROME, RIGHT 04/11/2010  . VITAMIN  D DEFICIENCY 02/18/2010  . Bipolar 1 disorder ([place]) 01/17/2010  . Essential hypertension, benign 01/17/2010  . INSOMNIA 01/17/2010  . OBESITY, NOS 01/03/2007  . Depression 01/03/2007  . HEARING LOSS NOS OR DEAFNESS 01/03/2007  . EDEMA-LEGS,DUE TO VENOUS OBSTRUCT. 01/03/2007  . Irritable bowel syndrome 01/03/2007    Carney Living PT DPT 03/22/2020, 3:34 PM  [facility] [address] [place], [place], [address] Phone: [phone]   Fax:  [phone]  Name: DAWNYA GRAMS MRN: [id number] Date of Birth: 12-Aug-1966

## 2020-03-23 ENCOUNTER — Ambulatory Visit: Payer: Medicaid Other | Admitting: Physical Therapy

## 2020-03-23 DIAGNOSIS — E119 Type 2 diabetes mellitus without complications: Secondary | ICD-10-CM | POA: Diagnosis not present

## 2020-03-24 DIAGNOSIS — E119 Type 2 diabetes mellitus without complications: Secondary | ICD-10-CM | POA: Diagnosis not present

## 2020-03-25 DIAGNOSIS — E119 Type 2 diabetes mellitus without complications: Secondary | ICD-10-CM | POA: Diagnosis not present

## 2020-03-26 DIAGNOSIS — E119 Type 2 diabetes mellitus without complications: Secondary | ICD-10-CM | POA: Diagnosis not present

## 2020-03-27 DIAGNOSIS — E119 Type 2 diabetes mellitus without complications: Secondary | ICD-10-CM | POA: Diagnosis not present

## 2020-03-28 DIAGNOSIS — E119 Type 2 diabetes mellitus without complications: Secondary | ICD-10-CM | POA: Diagnosis not present

## 2020-03-29 DIAGNOSIS — E119 Type 2 diabetes mellitus without complications: Secondary | ICD-10-CM | POA: Diagnosis not present

## 2020-03-30 ENCOUNTER — Encounter: Payer: Self-pay | Admitting: Physical Therapy

## 2020-03-30 ENCOUNTER — Other Ambulatory Visit: Payer: Self-pay

## 2020-03-30 ENCOUNTER — Ambulatory Visit: Payer: Medicaid Other | Admitting: Physical Therapy

## 2020-03-30 DIAGNOSIS — R262 Difficulty in walking, not elsewhere classified: Secondary | ICD-10-CM

## 2020-03-30 DIAGNOSIS — M25562 Pain in left knee: Secondary | ICD-10-CM

## 2020-03-30 DIAGNOSIS — M25662 Stiffness of left knee, not elsewhere classified: Secondary | ICD-10-CM

## 2020-03-30 DIAGNOSIS — M6281 Muscle weakness (generalized): Secondary | ICD-10-CM

## 2020-03-30 DIAGNOSIS — E119 Type 2 diabetes mellitus without complications: Secondary | ICD-10-CM | POA: Diagnosis not present

## 2020-03-31 ENCOUNTER — Encounter: Payer: Self-pay | Admitting: Physical Therapy

## 2020-03-31 DIAGNOSIS — E119 Type 2 diabetes mellitus without complications: Secondary | ICD-10-CM | POA: Diagnosis not present

## 2020-03-31 NOTE — Therapy (Addendum)
[facility] Outpatient Rehabilitation [facility] [address] [place], [place], [address] Phone: [phone]   Fax:  [phone]  Physical Therapy Treatment  Patient Details  Name: Katie [name] MRN: [id number] Date of Birth: 25-Oct-1966 Referring Provider (PT): Renaye Rakers MD   Encounter Date: 03/30/2020  PT End of Session - 03/30/20 1425    Visit Number  12    Number of Visits  16    Date for PT Re-Evaluation  04/08/20    Authorization Type  MCD  1st authorization submitted 02-12-20 for 3 visits in 2 weeks reauthorize on 02-24-20 2nd re authorization on 02-24-20    PT Start Time  1415    PT Stop Time  1512    PT Time Calculation (min)  57 min    Activity Tolerance  Patient tolerated treatment well    Behavior During Therapy  [facility] for tasks assessed/performed       Past Medical History:  Diagnosis Date  . Anxiety   . Back pain   . Bipolar 1 disorder (HCC)   . Chronic pain entered 08/15/2015   Treated with Oxycodone  . Depression   . Depression   . Dyspnea   . History of acute bronchitis   . HTN (hypertension)   . OA (osteoarthritis)   . Obesity   . PE (pulmonary embolism)    oct 2014  . Sciatica     Past Surgical History:  Procedure Laterality Date  . ADENOIDECTOMY    . BIOPSY STOMACH    . CARDIAC CATHETERIZATION  2009   normal; in [place]  . CESAREAN SECTION  2002   x 2, 1997 and 2002  . CHOLECYSTECTOMY  2007  . KNEE SURGERY Right 2005  . TONSILLECTOMY    . TOTAL KNEE ARTHROPLASTY Left 01/27/2020   Procedure: TOTAL KNEE ARTHROPLASTY;  Surgeon: [name], MD;  Location: [facility];  Service: Orthopedics;  Laterality: Left;  . TYMPANOSTOMY TUBE PLACEMENT      There were no vitals filed for this visit.  Subjective Assessment - 03/30/20 1421    Subjective  Patient is doing better. She is having less pain. She still feels like she is having a little swelling.    Pertinent History  DDD, Sciatica, RTC syndrome, plantar fasciitis, carpal  tunnel.  OA, obesity,  See medical History, ALLERGIC to Percocet, Hydrocodone,Vicodin    Limitations  Standing;Walking;House hold activities;Sitting    How long can you sit comfortably?  20 minutes    How long can you stand comfortably?  10-15    How long can you walk comfortably?  10-15    Diagnostic tests  x ray    Patient Stated Goals  I have 10, 19, 24 children and i want to get back and spend time with them, go to [facility] and exercise, cooking    Currently in Pain?  Yes    Pain Score  6     Pain Location  Knee    Pain Orientation  Left    Pain Descriptors / Indicators  Aching    Pain Type  Chronic pain    Pain Onset  More than a month ago    Pain Frequency  Intermittent    Aggravating Factors   standing and walking    Pain Relieving Factors  rest and ice    Effect of Pain on Daily Activities  difficulty perfroming ADL's  [facility] Adult PT Treatment/Exercise - 03/31/20 0001      Knee/Hip Exercises: Standing   Lateral Step Up  Left;1 set;Hand Hold: 2;15 reps;Step Height: 4"    Forward Step Up  Left;1 set;15 reps;Hand Hold: 1;Step Height: 4"    Step Down  Left;2 sets;10 reps;Step Height: 2"    Functional Squat Limitations  x10 with cuing for technique       Knee/Hip Exercises: Seated   Long Arc Quad Limitations  attempted but held 2nd to lateral pain       Knee/Hip Exercises: Supine   Quad Sets Limitations  x20 5 dsec holf     Short Arc Quad Sets  Strengthening;Left;20 reps    Straight Leg Raises Limitations  2x10       Vasopneumatic   Number Minutes Vasopneumatic   15 minutes    Vasopnuematic Location   Knee    Vasopneumatic Pressure  Medium    Vasopneumatic Temperature   32 degrees      Manual Therapy   Manual therapy comments  patellar mobilization. Painful with inferio/superior glide    Edema Management  soft tissue lymphatic drainage distal to proximal    Passive ROM  knee  flex              PT Education - 03/30/20 1424     Education Details  HEP and symptom management    Person(s) Educated  Patient    Methods  Explanation;Demonstration;Tactile cues;Verbal cues    Comprehension  Verbalized understanding;Returned demonstration;Verbal cues required;Tactile cues required       PT Short Term Goals - 03/18/20 1541      PT SHORT TERM GOAL #1   Title  Independent with initial HEP    Time  2    Period  Weeks    Status  Achieved    Target Date  02/26/20      PT SHORT TERM GOAL #2   Title  AROM of knee extension -8 to 100 flexion to increase mobility for transitional movements    Baseline  0-105    Time  2    Period  Weeks    Status  Achieved    Target Date  02/26/20      PT SHORT TERM GOAL #3   Title  Edema of LT knee will decreased from 50cm to 45 cm    Baseline  eval  LT 50 cm RT 43 cm  today 49 cm  RT 43    Time  2    Period  Weeks    Status  On-going    Target Date  02/26/20        PT Long Term Goals - 02/24/20 1812      PT LONG TERM GOAL #1   Title  Pt will be independent with advanced HEP.    Baseline  independent with intial , continuing to advanced    Time  8    Period  Weeks    Status  On-going      PT LONG TERM GOAL #2   Title  Pt will be able to negotiate steps without exacerbation of pain greater than 2/10  and LRAD    Baseline  able to go up and down steps with cane and 3/10 pain 02-24-20    Time  8    Period  Weeks    Status  On-going      PT LONG TERM GOAL #3   Title  Pt will improve her LT  knee flexion to  >/= 120 degrees and extension to </= 5 degrees with </= 2/10 pain for a more functional and efficient gait pattern    Baseline  LT knee -5 ext and 103    Time  8    Period  Weeks    Status  On-going      PT LONG TERM GOAL #4   Title  Pt will be able to perform standing to floor transfer in order to play with kids and to ensure safety in home    Baseline  unable  to perform due to deficit in LT knee bend    Time  8    Period  Weeks    Status  On-going      PT  LONG TERM GOAL #5   Title  PT with be able to walk/stand >/= 1 hour with no AD with </= 2/10 pain for functional endurance and return to shopping/leisure activities    Baseline  Pt now using cane but at 3/10 pain    Time  8    Period  Weeks    Status  On-going            Plan - 03/30/20 1437    Clinical Impression Statement  Patient is back to perfroming exercises with no major difficulty. She was able to perfrom stair and table strengthening without increase in pain. She reported mild tightness. We performed vaso 2nd to a feeling of mild swelling. Therapy wil continue to progress as tolerated.    Personal Factors and Comorbidities  Comorbidity 1;Comorbidity 2    Comorbidities  DDD, Sciatica, RTC syndrome, plantar fasciitis, carpal tunnel.  OA, obesity,  See medical History, ALLERGIC to Percocet, Hydrocodone,Vicodin, Aleve    Examination-Activity Limitations  Bend;Dressing;Hygiene/Grooming;Toileting;Stand;Stairs;Squat;Sleep;Transfers;Locomotion Level    Stability/Clinical Decision Making  Stable/Uncomplicated    Clinical Decision Making  Moderate    Rehab Potential  Good    PT Frequency  2x / week    PT Duration  8 weeks    PT Treatment/Interventions  Joint Manipulations;ADLs/Self Care Home Management;Cryotherapy;Electrical Stimulation;Ultrasound;Moist Heat;Iontophoresis 4mg /ml Dexamethasone;Gait training;Stair training;Therapeutic activities;Functional mobility training;Therapeutic exercise;Balance training;Patient/family education;Neuromuscular re-education;Manual techniques;Passive range of motion;Vasopneumatic Device;Taping;Dry needling    PT Next Visit Plan  continue to work on manual therapy as needed. Progress standing activity as tolerated.    PT Home Exercise Plan  Phase 1 TKR exercises, [id number] hip abduction and bridge and clamshell step ups and lateral step ups and step downs    Consulted and Agree with Plan of Care  Patient       Patient will benefit from skilled  therapeutic intervention in order to improve the following deficits and impairments:  Difficulty walking, Decreased activity tolerance, Decreased balance, Decreased mobility, Decreased range of motion, Decreased strength, Increased edema, Increased muscle spasms, Postural dysfunction, Improper body mechanics, Pain, Obesity  Visit Diagnosis: Acute pain of left knee  Stiffness of left knee, not elsewhere classified  Muscle weakness (generalized)  Difficulty in walking, not elsewhere classified     Problem List Patient Active Problem List   Diagnosis Date Noted  . Primary osteoarthritis of left knee 12/29/2019  . Morbid obesity (HCC)   . Bradycardia   . Pulmonary embolism on long-term anticoagulation therapy (HCC) 04/06/2016  . Pain in the chest   . Chronic pain   . Chest wall pain   . Pulmonary embolism (HCC) 07/31/2013  . Vocal cord dysfunction 02/28/2012  . Dyspnea 01/17/2012  . Chest pain 11/25/2011  . Hypokalemia 11/25/2011  .  GERD (gastroesophageal reflux disease) 03/08/2011  . DYSMENORRHEA 11/10/2010  . 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 ROTATOR CUFF SYNDROME, RIGHT 04/11/2010  . VITAMIN D DEFICIENCY 02/18/2010  . Bipolar 1 disorder ([place]) 01/17/2010  . Essential hypertension, benign 01/17/2010  . INSOMNIA 01/17/2010  . OBESITY, NOS 01/03/2007  . Depression 01/03/2007  . HEARING LOSS NOS OR DEAFNESS 01/03/2007  . EDEMA-LEGS,DUE TO VENOUS OBSTRUCT. 01/03/2007  . Irritable bowel syndrome 01/03/2007    Carney Living PT DPT  03/31/2020, 1:24 PM  [facility] [address] [place], [place], [address] Phone: [phone]   Fax:  [phone]  Name: DASHAWN GOLDA MRN: [id number] Date of Birth: 05/18/1966

## 2020-04-01 ENCOUNTER — Ambulatory Visit: Payer: Medicaid Other | Admitting: Physical Therapy

## 2020-04-01 DIAGNOSIS — E119 Type 2 diabetes mellitus without complications: Secondary | ICD-10-CM | POA: Diagnosis not present

## 2020-04-02 DIAGNOSIS — E119 Type 2 diabetes mellitus without complications: Secondary | ICD-10-CM | POA: Diagnosis not present

## 2020-04-03 DIAGNOSIS — E119 Type 2 diabetes mellitus without complications: Secondary | ICD-10-CM | POA: Diagnosis not present

## 2020-04-04 DIAGNOSIS — E119 Type 2 diabetes mellitus without complications: Secondary | ICD-10-CM | POA: Diagnosis not present

## 2020-04-05 DIAGNOSIS — E119 Type 2 diabetes mellitus without complications: Secondary | ICD-10-CM | POA: Diagnosis not present

## 2020-04-06 DIAGNOSIS — E119 Type 2 diabetes mellitus without complications: Secondary | ICD-10-CM | POA: Diagnosis not present

## 2020-04-07 DIAGNOSIS — E119 Type 2 diabetes mellitus without complications: Secondary | ICD-10-CM | POA: Diagnosis not present

## 2020-04-08 ENCOUNTER — Other Ambulatory Visit: Payer: Self-pay

## 2020-04-08 ENCOUNTER — Encounter: Payer: Self-pay | Admitting: Physical Therapy

## 2020-04-08 ENCOUNTER — Ambulatory Visit: Payer: Medicaid Other | Attending: Orthopedic Surgery | Admitting: Physical Therapy

## 2020-04-08 DIAGNOSIS — M25562 Pain in left knee: Secondary | ICD-10-CM | POA: Insufficient documentation

## 2020-04-08 DIAGNOSIS — R262 Difficulty in walking, not elsewhere classified: Secondary | ICD-10-CM | POA: Diagnosis present

## 2020-04-08 DIAGNOSIS — M25662 Stiffness of left knee, not elsewhere classified: Secondary | ICD-10-CM | POA: Insufficient documentation

## 2020-04-08 DIAGNOSIS — M6281 Muscle weakness (generalized): Secondary | ICD-10-CM | POA: Diagnosis present

## 2020-04-08 DIAGNOSIS — E119 Type 2 diabetes mellitus without complications: Secondary | ICD-10-CM | POA: Diagnosis not present

## 2020-04-08 NOTE — Therapy (Signed)
[place], [place], [address] Phone: [phone]   Fax:  [phone]  Physical Therapy Treatment  Patient Details  Name: Katie [name] MRN: [id number] Date of Birth: [birth year] Referring Provider (PT): Fredonia Highland MD   Encounter Date: 04/08/2020  PT End of Session - 04/08/20 1523    Visit Number  13    Number of Visits  16    Date for PT Re-Evaluation  04/08/20    Authorization Type  MCD  1st authorization submitted 02-12-20 for 3 visits in 2 weeks reauthorize on 02-24-20 2nd re authorization on 02-24-20    Authorization Time Period  awaiting re-auth    PT Start Time  0300    PT Stop Time  0340    PT Time Calculation (min)  40 min    Activity Tolerance  Patient tolerated treatment well    Behavior During Therapy  [facility] for tasks assessed/performed       Past Medical History:  Diagnosis Date  . Anxiety   . Back pain   . Bipolar 1 disorder ([place])   . Chronic pain entered 08/15/2015   Treated with Oxycodone  . Depression   . Depression   . Dyspnea   . History of acute bronchitis   . HTN (hypertension)   . OA (osteoarthritis)   . Obesity   . PE (pulmonary embolism)    oct 2014  . Sciatica     Past Surgical History:  Procedure Laterality Date  . ADENOIDECTOMY    . BIOPSY STOMACH    . CARDIAC CATHETERIZATION  2009   normal; in [place]  . CESAREAN SECTION  2002   x 2, 1997 and 2002  . CHOLECYSTECTOMY  2007  . KNEE SURGERY Right 2005  . TONSILLECTOMY    . TOTAL KNEE ARTHROPLASTY Left 01/27/2020   Procedure: TOTAL KNEE ARTHROPLASTY;  Surgeon: Renette Butters, MD;  Location: [facility];  Service: Orthopedics;  Laterality: Left;  . TYMPANOSTOMY TUBE PLACEMENT      There were no vitals filed for this visit.  Subjective Assessment - 04/08/20 1517    Subjective  Patient reports just minor sweeling when she is up and doing things.    Pertinent History  DDD, Sciatica, RTC syndrome, plantar fasciitis,  carpal tunnel.  OA, obesity,  See medical History, ALLERGIC to Percocet, Hydrocodone,Vicodin    Limitations  Standing;Walking;House hold activities;Sitting    How long can you sit comfortably?  20 minutes    How long can you stand comfortably?  10-15    How long can you walk comfortably?  10-15    Diagnostic tests  x ray    Patient Stated Goals  I have 10, 19, 24 children and i want to get back and spend time with them, go to [facility] and exercise, cooking    Currently in Pain?  Yes    Pain Score  2     Pain Location  Knee    Pain Orientation  Left    Pain Descriptors / Indicators  Aching    Pain Type  Chronic pain    Pain Onset  More than a month ago    Pain Frequency  Intermittent    Aggravating Factors   standing and walking    Pain Relieving Factors  rest and ice    Effect of Pain on Daily Activities  difficulty perfroming ADL's  [facility] Adult PT Treatment/Exercise - 04/08/20 0001      Knee/Hip Exercises: Aerobic   Nustep  5 min L3       Knee/Hip Exercises: Machines for Strengthening   Total Gym Leg Press  3x10 40lbs       Knee/Hip Exercises: Standing   Heel Raises Limitations  x20    Lateral Step Up  Left;1 set;Hand Hold: 2;15 reps;Step Height: 6"    Forward Step Up  Step Height: 6";15 reps    Step Down  Left;2 sets;10 reps;Step Height: 2"      Manual Therapy   Manual therapy comments  patellar mobilization. Painful with inferio/superior glide    Edema Management  soft tissue lymphatic drainage distal to proximal    Passive ROM  knee  flex              PT Education - 04/08/20 1523    Education Details  reviewed ther-ex    Person(s) Educated  Patient    Methods  Explanation;Demonstration;Tactile cues;Verbal cues    Comprehension  Verbal cues required;Tactile cues required;Verbalized understanding;Returned demonstration       PT Short Term Goals - 04/08/20 1618      PT SHORT TERM GOAL #1   Title  Independent with initial  HEP    Time  2    Period  Weeks    Status  Achieved    Target Date  02/26/20      PT SHORT TERM GOAL #2   Title  AROM of knee extension -8 to 100 flexion to increase mobility for transitional movements    Baseline  0-122    Time  2    Period  Weeks    Status  Achieved    Target Date  02/26/20      PT SHORT TERM GOAL #3   Title  Edema of LT knee will decreased from 50cm to 45 cm    Baseline  eval  LT 50 cm RT 43 cm  today 49 cm  RT 43    Time  2    Period  Weeks    Target Date  02/26/20        PT Long Term Goals - 02/24/20 1812      PT LONG TERM GOAL #1   Title  Pt will be independent with advanced HEP.    Baseline  independent with intial , continuing to advanced    Time  8    Period  Weeks    Status  On-going      PT LONG TERM GOAL #2   Title  Pt will be able to negotiate steps without exacerbation of pain greater than 2/10  and LRAD    Baseline  able to go up and down steps with cane and 3/10 pain 02-24-20    Time  8    Period  Weeks    Status  On-going      PT LONG TERM GOAL #3   Title  Pt will improve her LT knee flexion to  >/= 120 degrees and extension to </= 5 degrees with </= 2/10 pain for a more functional and efficient gait pattern    Baseline  LT knee -5 ext and 103    Time  8    Period  Weeks    Status  On-going      PT LONG TERM GOAL #4   Title  Pt will be able to perform standing to floor transfer in order to  play with kids and to ensure safety in home    Baseline  unable  to perform due to deficit in LT knee bend    Time  8    Period  Weeks    Status  On-going      PT LONG TERM GOAL #5   Title  PT with be able to walk/stand >/= 1 hour with no AD with </= 2/10 pain for functional endurance and return to shopping/leisure activities    Baseline  Pt now using cane but at 3/10 pain    Time  8    Period  Weeks    Status  On-going            Plan - 04/08/20 1526    Clinical Impression Statement  Patient is making good progress. her rnage was  measured at 0-123. She was able to perfrom the leg press today. Her ability to tolerate stair training is is mimproving. She is making great progress. Therapy will re-assess next visit.    Personal Factors and Comorbidities  Comorbidity 1;Comorbidity 2    Comorbidities  DDD, Sciatica, RTC syndrome, plantar fasciitis, carpal tunnel.  OA, obesity,  See medical History, ALLERGIC to Percocet, Hydrocodone,Vicodin, Aleve    Examination-Activity Limitations  Bend;Dressing;Hygiene/Grooming;Toileting;Stand;Stairs;Squat;Sleep;Transfers;Locomotion Level    Stability/Clinical Decision Making  Stable/Uncomplicated    Clinical Decision Making  Moderate    Rehab Potential  Good    PT Frequency  2x / week    PT Treatment/Interventions  Joint Manipulations;ADLs/Self Care Home Management;Cryotherapy;Electrical Stimulation;Ultrasound;Moist Heat;Iontophoresis 4mg /ml Dexamethasone;Gait training;Stair training;Therapeutic activities;Functional mobility training;Therapeutic exercise;Balance training;Patient/family education;Neuromuscular re-education;Manual techniques;Passive range of motion;Vasopneumatic Device;Taping;Dry needling    PT Next Visit Plan  continue to work on manual therapy as needed. Progress standing activity as tolerated.    PT Home Exercise Plan  Phase 1 TKR exercises, [id number] hip abduction and bridge and clamshell step ups and lateral step ups and step downs    Consulted and Agree with Plan of Care  Patient       Patient will benefit from skilled therapeutic intervention in order to improve the following deficits and impairments:  Difficulty walking, Decreased activity tolerance, Decreased balance, Decreased mobility, Decreased range of motion, Decreased strength, Increased edema, Increased muscle spasms, Postural dysfunction, Improper body mechanics, Pain, Obesity  Visit Diagnosis: Acute pain of left knee  Stiffness of left knee, not elsewhere classified  Muscle weakness  (generalized)  Difficulty in walking, not elsewhere classified     Problem List Patient Active Problem List   Diagnosis Date Noted  . Primary osteoarthritis of left knee 12/29/2019  . Morbid obesity ([facility])   . Bradycardia   . Pulmonary embolism on long-term anticoagulation therapy ([facility]) 04/06/2016  . Pain in the chest   . Chronic pain   . Chest wall pain   . Pulmonary embolism ([facility]) 07/31/2013  . Vocal cord dysfunction 02/28/2012  . Dyspnea 01/17/2012  . Chest pain 11/25/2011  . Hypokalemia 11/25/2011  . GERD (gastroesophageal reflux disease) 03/08/2011  . DYSMENORRHEA 11/10/2010  . 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 ROTATOR CUFF SYNDROME, RIGHT 04/11/2010  . VITAMIN D DEFICIENCY 02/18/2010  . Bipolar 1 disorder ([facility]) 01/17/2010  . Essential hypertension, benign 01/17/2010  . INSOMNIA 01/17/2010  . OBESITY, NOS 01/03/2007  . Depression 01/03/2007  . HEARING LOSS NOS OR DEAFNESS 01/03/2007  . EDEMA-LEGS,DUE TO VENOUS OBSTRUCT. 01/03/2007  . Irritable bowel syndrome 01/03/2007    01/05/2007 PT DPT  04/08/2020, 4:21 PM  [facility] [address] [place], [place], [address] Phone: [phone]   Fax:  [phone]  Name: Katie [name] MRN: [id number] Date of Birth: Apr 13, 1966

## 2020-04-09 DIAGNOSIS — E119 Type 2 diabetes mellitus without complications: Secondary | ICD-10-CM | POA: Diagnosis not present

## 2020-04-10 DIAGNOSIS — E119 Type 2 diabetes mellitus without complications: Secondary | ICD-10-CM | POA: Diagnosis not present

## 2020-04-11 DIAGNOSIS — E119 Type 2 diabetes mellitus without complications: Secondary | ICD-10-CM | POA: Diagnosis not present

## 2020-04-12 DIAGNOSIS — E119 Type 2 diabetes mellitus without complications: Secondary | ICD-10-CM | POA: Diagnosis not present

## 2020-04-13 ENCOUNTER — Other Ambulatory Visit: Payer: Self-pay

## 2020-04-13 ENCOUNTER — Ambulatory Visit: Payer: Medicaid Other | Admitting: Physical Therapy

## 2020-04-13 DIAGNOSIS — M25562 Pain in left knee: Secondary | ICD-10-CM | POA: Diagnosis not present

## 2020-04-13 DIAGNOSIS — R262 Difficulty in walking, not elsewhere classified: Secondary | ICD-10-CM

## 2020-04-13 DIAGNOSIS — E119 Type 2 diabetes mellitus without complications: Secondary | ICD-10-CM | POA: Diagnosis not present

## 2020-04-13 DIAGNOSIS — M25662 Stiffness of left knee, not elsewhere classified: Secondary | ICD-10-CM

## 2020-04-13 DIAGNOSIS — M6281 Muscle weakness (generalized): Secondary | ICD-10-CM

## 2020-04-13 NOTE — Therapy (Signed)
[place], [place], [address] Phone: [phone]   Fax:  [phone]  Physical Therapy Treatment/ERO  Patient Details  Name: Katie [name] MRN: [id number] Date of Birth: 1965/11/08 Referring Provider (PT): Fredonia Highland MD   Encounter Date: 04/13/2020  PT End of Session - 04/13/20 1717    Visit Number  14    Number of Visits  21    Date for PT Re-Evaluation  06/01/20    Authorization Type  MCD  1st authorization submitted 02-12-20 for 3 visits in 2 weeks reauthorize on 02-24-20 2nd re authorization on 02-24-20 3rd submit of MCD on 04-13-20    PT Start Time  0345    PT Stop Time  0443    PT Time Calculation (min)  58 min    Activity Tolerance  Patient tolerated treatment well    Behavior During Therapy  [facility] for tasks assessed/performed       Past Medical History:  Diagnosis Date  . Anxiety   . Back pain   . Bipolar 1 disorder ([place])   . Chronic pain entered 08/15/2015   Treated with Oxycodone  . Depression   . Depression   . Dyspnea   . History of acute bronchitis   . HTN (hypertension)   . OA (osteoarthritis)   . Obesity   . PE (pulmonary embolism)    oct 2014  . Sciatica     Past Surgical History:  Procedure Laterality Date  . ADENOIDECTOMY    . BIOPSY STOMACH    . CARDIAC CATHETERIZATION  2009   normal; in [place]  . CESAREAN SECTION  2002   x 2, 1997 and 2002  . CHOLECYSTECTOMY  2007  . KNEE SURGERY Right 2005  . TONSILLECTOMY    . TOTAL KNEE ARTHROPLASTY Left 01/27/2020   Procedure: TOTAL KNEE ARTHROPLASTY;  Surgeon: Renette Butters, MD;  Location: [facility];  Service: Orthopedics;  Laterality: Left;  . TYMPANOSTOMY TUBE PLACEMENT      There were no vitals filed for this visit.  Subjective Assessment - 04/13/20 1620    Subjective  Pt comes in with slight swelling over lateral LT knee  4/10 pain    Pertinent History  DDD, Sciatica, RTC syndrome, plantar fasciitis, carpal tunnel.  OA,  obesity,  See medical History, ALLERGIC to Percocet, Hydrocodone,Vicodin    Limitations  Standing;Walking;House hold activities;Sitting    How long can you sit comfortably?  25 minutes    How long can you stand comfortably?  15 minutes and then knee swells    Diagnostic tests  x ray    Patient Stated Goals  I have 10, 19, 24 children and i want to get back and spend time with them, go to [facility] and exercise, cooking    Currently in Pain?  Yes    Pain Score  4     Pain Location  Knee    Pain Orientation  Left    Pain Descriptors / Indicators  Aching    Pain Type  Chronic pain    Pain Onset  More than a month ago    Pain Frequency  Intermittent    Pain Relieving Factors  rest and ice         [facility] PT Assessment - 04/13/20 0001      Assessment   Medical Diagnosis  LT TKR    Referring Provider (PT)  Fredonia Highland MD    Onset Date/Surgical Date  01/27/20  Observation/Other Assessments-Edema    Edema  Circumferential      Circumferential Edema   Circumferential - Left   41 cm from 50 cm at eval        Functional Tests   Functional tests  Hopping      Hopping   Comments  unable to hop at this time.      Floor to Stand   Comments  unable to transfer floor to stand and vice versa without UE support and CGAx 1      AROM   Left Knee Extension  0    Left Knee Flexion  123      PROM   Left Knee Extension  +3    Left Knee Flexion  126      Strength   Right Hip Flexion  4+/5    Right Hip Extension  4/5    Right Hip ABduction  4-/5    Left Hip Flexion  4+/5    Left Hip Extension  4/5    Left Hip ABduction  4/5    Right Knee Flexion  4+/5    Right Knee Extension  4+/5    Left Knee Flexion  4-/5    Left Knee Extension  4-/5      Ambulation/Gait   Stairs  Yes    Stairs Assistance  7: Independent    Stair Management Technique  One rail Right    Number of Stairs  16    Height of Stairs  6                    [facility] Adult PT Treatment/Exercise - 04/13/20 0001       Therapeutic Activites    Therapeutic Activities  Other Therapeutic Activities    Other Therapeutic Activities  floor to stand and vice versa activities   requireing CGA and bil UE support to rise from floor VC/TC     Knee/Hip Exercises: Machines for Strengthening   Total Gym Leg Press  1 x 10 35, 1 x 10 45. 1x 10 55#      Knee/Hip Exercises: Standing   Heel Raises Limitations  x20    Lateral Step Up  Left;2 sets;10 reps;Hand Hold: 2;Step Height: 6"    Forward Step Up  Left;2 sets;10 reps;Hand Hold: 2;Step Height: 6"    Step Down  Left;2 sets;10 reps;Step Height: 2"    Functional Squat Limitations  x10 with cuing for technique       Cryotherapy   Number Minutes Cryotherapy  12 Minutes    Cryotherapy Location  Knee    Type of Cryotherapy  Ice pack      Vasopneumatic   Number Minutes Vasopneumatic   --    Vasopnuematic Location   --    Vasopneumatic Pressure  --    Vasopneumatic Temperature   --      Manual Therapy   Manual Therapy  Joint mobilization;Soft tissue mobilization    Manual therapy comments  patellar mobilization. Painful with inferio/superior glide    Edema Management  soft tissue lymphatic drainage distal to proximal    Passive ROM  knee  flex                PT Short Term Goals - 04/13/20 1551      PT SHORT TERM GOAL #1   Title  Independent with initial HEP    Time  2    Period  Weeks    Status  Achieved  Target Date  02/26/20      PT SHORT TERM GOAL #2   Title  AROM of knee extension -8 to 100 flexion to increase mobility for transitional movements    Baseline  0-122    Time  2    Period  Weeks    Status  Achieved    Target Date  02/26/20      PT SHORT TERM GOAL #3   Title  Edema of LT knee will decreased from 50cm to 45 cm    Baseline  LT 41 cm    Time  2    Period  Weeks    Status  Achieved        PT Long Term Goals - 04/13/20 1553      PT LONG TERM GOAL #1   Title  Pt will be independent with advanced HEP given     Baseline  independent with intial , continuing to advanced    Time  7    Period  Weeks    Status  On-going    Target Date  06/01/20      PT LONG TERM GOAL #2   Title  Pt will be able to negotiate steps without exacerbation of pain greater than 2/10  and LRAD    Baseline  able to go up and down steps with cane and 3/10 pain 04-13-20 wit no AD but one hand held on rail    Time  7    Period  Weeks    Status  Partially Met    Target Date  06/01/20      PT LONG TERM GOAL #3   Title  Pt will improve her LT knee flexion to  >/= 120 degrees and extension to </= 5 degrees with </= 2/10 pain for a more functional and efficient gait pattern    Baseline  AROM 123 LT Flexion of knee  ext 0 pain level 4/10 due to lateral knee swelling    Time  7    Period  Weeks    Status  On-going    Target Date  06/01/20      PT LONG TERM GOAL #4   Title  Pt will be able to perform standing to floor transfer in order to play with kids and to ensure safety in home    Baseline  Pt tries to perform but needs CGA and requires 2 UE support to lower and to rise from ground with VC and TC required for safety and technique    Time  7    Period  Weeks    Status  On-going    Target Date  06/01/20      PT LONG TERM GOAL #5   Title  PT with be able to walk/stand >/= 1 hour with no AD with </= 2/10 pain for functional endurance and return to shopping/leisure activities    Baseline  Pt now no longer using cane but does have residual gait deficit due to weakness, Pain at 4/10    Time  7    Period  Weeks    Status  On-going    Target Date  06/01/20            Plan - 04/13/20 1722    Clinical Impression Statement  Ms Devine enters clinic with antalgic and side to side motion with gait.  Pt has partially met LTG of negotiating steps but has not completed all other LTG's including ability to walk for 1  hour and floor to stand transfers.  She still has pain at 4/10 and swellling on lateral side of knee vastus lateralis.   She also is unable to walk longer than 15 minutes.  due to patient ongoing goals of continuing weight loss( has lost 90 lb) and isnow unable to walk for longer than 15 mnutes, pt will benefit from additional 1 x a week for 7 weeks in order to strengthen pt to level to continue to work on healthy goals.   Pt still have knee weakness and pain which prevent completion of goals and Ms Lugar will benefit from skilled PT to address impairments.  ms Miranda still have young children in which she needs to be able to rise and descent to floor and is unable to do so independently .  Pt will benefit from skilled PT    Personal Factors and Comorbidities  Comorbidity 1;Comorbidity 2    Comorbidities  DDD, Sciatica, RTC syndrome, plantar fasciitis, carpal tunnel.  OA, obesity,  See medical History, ALLERGIC to Percocet, Hydrocodone,Vicodin, Aleve    Examination-Activity Limitations  Bend;Dressing;Hygiene/Grooming;Toileting;Stand;Stairs;Squat;Sleep;Transfers;Locomotion Level    Stability/Clinical Decision Making  Stable/Uncomplicated    Clinical Decision Making  Moderate    Rehab Potential  Good    PT Frequency  1x / week    PT Duration  --   7 weeks   PT Treatment/Interventions  Joint Manipulations;ADLs/Self Care Home Management;Cryotherapy;Electrical Stimulation;Ultrasound;Moist Heat;Iontophoresis '4mg'$ /ml Dexamethasone;Gait training;Stair training;Therapeutic activities;Functional mobility training;Therapeutic exercise;Balance training;Patient/family education;Neuromuscular re-education;Manual techniques;Passive range of motion;Vasopneumatic Device;Taping;Dry needling    PT Next Visit Plan  contineu to work on floor to stand transfer, stair climbing gait to address abnormal gait pattern    PT Home Exercise Plan  Phase 1 TKR exercises, [id number] hip abduction and bridge and clamshell step ups and lateral step ups and step downs    Consulted and Agree with Plan of Care  Patient       Patient will benefit from skilled  therapeutic intervention in order to improve the following deficits and impairments:  Difficulty walking, Decreased activity tolerance, Decreased balance, Decreased mobility, Decreased range of motion, Decreased strength, Increased edema, Increased muscle spasms, Postural dysfunction, Improper body mechanics, Pain, Obesity  Visit Diagnosis: Acute pain of left knee  Stiffness of left knee, not elsewhere classified  Muscle weakness (generalized)  Difficulty in walking, not elsewhere classified     Problem List Patient Active Problem List   Diagnosis Date Noted  . Primary osteoarthritis of left knee 12/29/2019  . Morbid obesity ([place])   . Bradycardia   . Pulmonary embolism on long-term anticoagulation therapy ([place]) 04/06/2016  . Pain in the chest   . Chronic pain   . Chest wall pain   . Pulmonary embolism ([place]) 07/31/2013  . Vocal cord dysfunction 02/28/2012  . Dyspnea 01/17/2012  . Chest pain 11/25/2011  . Hypokalemia 11/25/2011  . GERD (gastroesophageal reflux disease) 03/08/2011  . DYSMENORRHEA 11/10/2010  . 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 ROTATOR CUFF SYNDROME, RIGHT 04/11/2010  . VITAMIN D DEFICIENCY 02/18/2010  . Bipolar 1 disorder ([place]) 01/17/2010  . Essential hypertension, benign 01/17/2010  . INSOMNIA 01/17/2010  . OBESITY, NOS 01/03/2007  . Depression 01/03/2007  . HEARING LOSS NOS OR DEAFNESS 01/03/2007  . EDEMA-LEGS,DUE TO VENOUS OBSTRUCT. 01/03/2007  . Irritable bowel syndrome 01/03/2007    Voncille Lo, PT Certified Exercise Expert for the Aging Adult  04/13/20 5:30 PM Phone: [phone] Fax: [facility] [phone]  [place], [place], [address] Phone: [phone]   Fax:  [phone]  Name: Katie [name] MRN: [id number] Date of Birth: 09/10/1966

## 2020-04-14 DIAGNOSIS — E119 Type 2 diabetes mellitus without complications: Secondary | ICD-10-CM | POA: Diagnosis not present

## 2020-04-15 DIAGNOSIS — E119 Type 2 diabetes mellitus without complications: Secondary | ICD-10-CM | POA: Diagnosis not present

## 2020-04-16 DIAGNOSIS — E119 Type 2 diabetes mellitus without complications: Secondary | ICD-10-CM | POA: Diagnosis not present

## 2020-04-17 DIAGNOSIS — E119 Type 2 diabetes mellitus without complications: Secondary | ICD-10-CM | POA: Diagnosis not present

## 2020-04-18 DIAGNOSIS — E119 Type 2 diabetes mellitus without complications: Secondary | ICD-10-CM | POA: Diagnosis not present

## 2020-04-27 ENCOUNTER — Telehealth: Payer: Self-pay | Admitting: Physical Therapy

## 2020-04-27 ENCOUNTER — Ambulatory Visit: Payer: Medicaid Other | Admitting: Physical Therapy

## 2020-04-27 NOTE — Telephone Encounter (Signed)
Contacted patient regarding missed visit. The patient reports she had to bring her son to the emergency room. She will be at her next appointment.

## 2020-05-04 ENCOUNTER — Ambulatory Visit: Payer: Medicaid Other | Admitting: Physical Therapy

## 2020-05-11 ENCOUNTER — Ambulatory Visit: Payer: Medicaid Other | Attending: Orthopedic Surgery | Admitting: Physical Therapy

## 2020-05-11 ENCOUNTER — Encounter: Payer: Self-pay | Admitting: Physical Therapy

## 2020-05-11 ENCOUNTER — Other Ambulatory Visit: Payer: Self-pay

## 2020-05-11 DIAGNOSIS — M25662 Stiffness of left knee, not elsewhere classified: Secondary | ICD-10-CM | POA: Diagnosis present

## 2020-05-11 DIAGNOSIS — M25562 Pain in left knee: Secondary | ICD-10-CM | POA: Insufficient documentation

## 2020-05-11 DIAGNOSIS — M6281 Muscle weakness (generalized): Secondary | ICD-10-CM | POA: Diagnosis present

## 2020-05-11 DIAGNOSIS — R262 Difficulty in walking, not elsewhere classified: Secondary | ICD-10-CM | POA: Insufficient documentation

## 2020-05-11 NOTE — Therapy (Signed)
[place], [place], [address] Phone: [phone]   Fax:  [phone]  Physical Therapy Treatment  Patient Details  Name: Katie [name] MRN: [id number] Date of Birth: [birth year] Referring Provider (PT): Fredonia Highland MD   Encounter Date: 05/11/2020   PT End of Session - 05/11/20 1122    Visit Number 15    Number of Visits 21    Date for PT Re-Evaluation 06/01/20    Authorization Type MCD  1st authorization submitted 02-12-20 for 3 visits in 2 weeks reauthorize on 02-24-20 2nd re authorization on 02-24-20 3rd submit of MCD on 04-13-20    PT Start Time 1119   patient arrived 19 minutes late   PT Stop Time 1208    PT Time Calculation (min) 49 min    Activity Tolerance Patient tolerated treatment well    Behavior During Therapy [facility] for tasks assessed/performed           Past Medical History:  Diagnosis Date  . Anxiety   . Back pain   . Bipolar 1 disorder ([place])   . Chronic pain entered 08/15/2015   Treated with Oxycodone  . Depression   . Depression   . Dyspnea   . History of acute bronchitis   . HTN (hypertension)   . OA (osteoarthritis)   . Obesity   . PE (pulmonary embolism)    oct 2014  . Sciatica     Past Surgical History:  Procedure Laterality Date  . ADENOIDECTOMY    . BIOPSY STOMACH    . CARDIAC CATHETERIZATION  2009   normal; in [place]  . CESAREAN SECTION  2002   x 2, 1997 and 2002  . CHOLECYSTECTOMY  2007  . KNEE SURGERY Right 2005  . TONSILLECTOMY    . TOTAL KNEE ARTHROPLASTY Left 01/27/2020   Procedure: TOTAL KNEE ARTHROPLASTY;  Surgeon: Renette Butters, MD;  Location: [facility];  Service: Orthopedics;  Laterality: Left;  . TYMPANOSTOMY TUBE PLACEMENT      There were no vitals filed for this visit.   Subjective Assessment - 05/11/20 1121    Subjective Pt states she has has some swelling on L knee. She tries to elevate and ice and the swelling has not come down. She also is  having some back pain today.    Pertinent History DDD, Sciatica, RTC syndrome, plantar fasciitis, carpal tunnel.  OA, obesity,  See medical History, ALLERGIC to Percocet, Hydrocodone,Vicodin    Limitations Standing;Walking;House hold activities;Sitting    How long can you sit comfortably? 25 minutes    How long can you stand comfortably? 15 minutes and then knee swells    How long can you walk comfortably? 10-15    Diagnostic tests x ray    Patient Stated Goals I have 10, 19, 24 children and i want to get back and spend time with them, go to [facility] and exercise, cooking    Currently in Pain? Yes    Pain Score 3     Pain Location Knee    Pain Orientation Left    Pain Descriptors / Indicators Aching    Pain Type Chronic pain    Pain Onset More than a month ago    Pain Frequency Intermittent                             [facility] Adult PT Treatment/Exercise - 05/11/20 0001  Knee/Hip Exercises: Aerobic   Nustep L3 x 5 min      Knee/Hip Exercises: Standing   Lateral Step Up 2 sets;10 reps;Hand Hold: 2;Step Height: 6";Right;Left    Forward Step Up Left;2 sets;10 reps;Hand Hold: 2;Step Height: 6"    Step Down 10 reps;Right;Left;1 set;Step Height: 6"   cueing to keep knee over ankle     Knee/Hip Exercises: Supine   Bridges 2 sets;10 reps    Other Supine Knee/Hip Exercises Marches 20 reps    Other Supine Knee/Hip Exercises Clamshell 2x10   red     Manual Therapy   Passive ROM knee flex                   PT Education - 05/11/20 1222    Education Details HEP and symptom mangament    Person(s) Educated Patient    Methods Explanation;Tactile cues;Demonstration;Verbal cues    Comprehension Verbalized understanding;Returned demonstration;Verbal cues required;Tactile cues required            PT Short Term Goals - 05/11/20 1226      PT SHORT TERM GOAL #1   Title Independent with initial HEP    Time 2    Status Achieved    Target Date 02/26/20      PT  SHORT TERM GOAL #2   Title AROM of knee extension -8 to 100 flexion to increase mobility for transitional movements    Baseline 0-122    Time 2    Period Weeks    Status Achieved    Target Date 02/26/20      PT SHORT TERM GOAL #3   Title Edema of LT knee will decreased from 50cm to 45 cm    Baseline LT 41 cm    Time 2    Period Weeks    Status Achieved    Target Date 02/26/20             PT Long Term Goals - 04/13/20 1553      PT LONG TERM GOAL #1   Title Pt will be independent with advanced HEP given    Baseline independent with intial , continuing to advanced    Time 7    Period Weeks    Status On-going    Target Date 06/01/20      PT LONG TERM GOAL #2   Title Pt will be able to negotiate steps without exacerbation of pain greater than 2/10  and LRAD    Baseline able to go up and down steps with cane and 3/10 pain 04-13-20 wit no AD but one hand held on rail    Time 7    Period Weeks    Status Partially Met    Target Date 06/01/20      PT LONG TERM GOAL #3   Title Pt will improve her LT knee flexion to  >/= 120 degrees and extension to </= 5 degrees with </= 2/10 pain for a more functional and efficient gait pattern    Baseline AROM 123 LT Flexion of knee  ext 0 pain level 4/10 due to lateral knee swelling    Time 7    Period Weeks    Status On-going    Target Date 06/01/20      PT LONG TERM GOAL #4   Title Pt will be able to perform standing to floor transfer in order to play with kids and to ensure safety in home    Baseline Pt tries to perform but  needs CGA and requires 2 UE support to lower and to rise from ground with VC and TC required for safety and technique    Time 7    Period Weeks    Status On-going    Target Date 06/01/20      PT LONG TERM GOAL #5   Title PT with be able to walk/stand >/= 1 hour with no AD with </= 2/10 pain for functional endurance and return to shopping/leisure activities    Baseline Pt now no longer using cane but does have  residual gait deficit due to weakness, Pain at 4/10    Time 7    Period Weeks    Status On-going    Target Date 06/01/20                 Plan - 05/11/20 1223    Clinical Impression Statement Depsite 1 month without trreatment the patient is doing well. Her pain is monor . She is still concerned avbout her sweleling. She reports at times she gets pockets of fluid in her knee when she does too much. Per visual inspection her range isabout where it was lastvisit. Therapy reviewed exercises that can help both her knee and low back. She has had some lower back pain when she stands or walks. Therapy will advance as able.    Comorbidities DDD, Sciatica, RTC syndrome, plantar fasciitis, carpal tunnel.  OA, obesity,  See medical History, ALLERGIC to Percocet, Hydrocodone,Vicodin, Aleve    Examination-Activity Limitations Bend;Dressing;Hygiene/Grooming;Toileting;Stand;Stairs;Squat;Sleep;Transfers;Locomotion Level    Stability/Clinical Decision Making Stable/Uncomplicated    Clinical Decision Making Moderate    Rehab Potential Good    PT Frequency 1x / week    PT Treatment/Interventions Joint Manipulations;ADLs/Self Care Home Management;Cryotherapy;Electrical Stimulation;Ultrasound;Moist Heat;Iontophoresis '4mg'$ /ml Dexamethasone;Gait training;Stair training;Therapeutic activities;Functional mobility training;Therapeutic exercise;Balance training;Patient/family education;Neuromuscular re-education;Manual techniques;Passive range of motion;Vasopneumatic Device;Taping;Dry needling    PT Next Visit Plan contineu to work on floor to stand transfer, stair climbing gait to address abnormal gait pattern    PT Home Exercise Plan Phase 1 TKR exercises, [id number] hip abduction and bridge and clamshell step ups and lateral step ups and step downs    Consulted and Agree with Plan of Care Patient           Patient will benefit from skilled therapeutic intervention in order to improve the following deficits and  impairments:  Difficulty walking, Decreased activity tolerance, Decreased balance, Decreased mobility, Decreased range of motion, Decreased strength, Increased edema, Increased muscle spasms, Postural dysfunction, Improper body mechanics, Pain, Obesity  Visit Diagnosis: Acute pain of left knee  Stiffness of left knee, not elsewhere classified  Muscle weakness (generalized)  Difficulty in walking, not elsewhere classified     Problem List Patient Active Problem List   Diagnosis Date Noted  . Primary osteoarthritis of left knee 12/29/2019  . Morbid obesity ([place])   . Bradycardia   . Pulmonary embolism on long-term anticoagulation therapy ([place]) 04/06/2016  . Pain in the chest   . Chronic pain   . Chest wall pain   . Pulmonary embolism ([place]) 07/31/2013  . Vocal cord dysfunction 02/28/2012  . Dyspnea 01/17/2012  . Chest pain 11/25/2011  . Hypokalemia 11/25/2011  . GERD (gastroesophageal reflux disease) 03/08/2011  . DYSMENORRHEA 11/10/2010  . 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 ROTATOR CUFF SYNDROME, RIGHT 04/11/2010  . VITAMIN D DEFICIENCY 02/18/2010  . Bipolar 1 disorder ([place]) 01/17/2010  . Essential hypertension, benign 01/17/2010  . INSOMNIA 01/17/2010  .  OBESITY, NOS 01/03/2007  . Depression 01/03/2007  . HEARING LOSS NOS OR DEAFNESS 01/03/2007  . EDEMA-LEGS,DUE TO VENOUS OBSTRUCT. 01/03/2007  . Irritable bowel syndrome 01/03/2007    Carney Living PT DPT 05/11/2020, 12:27 PM  [facility] [address] [place], [place], [address] Phone: [phone]   Fax:  [phone]  Name: Katie [name] MRN: [id number] Date of Birth: [birth year]

## 2020-05-18 ENCOUNTER — Ambulatory Visit: Payer: Medicaid Other | Admitting: Physical Therapy

## 2020-05-18 ENCOUNTER — Other Ambulatory Visit: Payer: Self-pay

## 2020-05-18 ENCOUNTER — Encounter: Payer: Self-pay | Admitting: Physical Therapy

## 2020-05-18 DIAGNOSIS — M6281 Muscle weakness (generalized): Secondary | ICD-10-CM

## 2020-05-18 DIAGNOSIS — M25562 Pain in left knee: Secondary | ICD-10-CM | POA: Diagnosis not present

## 2020-05-18 DIAGNOSIS — R262 Difficulty in walking, not elsewhere classified: Secondary | ICD-10-CM

## 2020-05-18 DIAGNOSIS — M25662 Stiffness of left knee, not elsewhere classified: Secondary | ICD-10-CM

## 2020-05-18 NOTE — Therapy (Signed)
[facility]patient Rehabilitation [facility] [address] [place], [place], [address] Phone: [phone]   Fax:  [phone]  Physical Therapy Treatment  Patient Details  Name: Katie [name] MRN: [id number] Date of Birth: 1966-07-04 Referring Provider (PT): Renaye Rakers MD   Encounter Date: 05/18/2020   PT End of Session - 05/18/20 1027    Visit Number 16    Number of Visits 21    Date for PT Re-Evaluation 06/01/20    Authorization Type MCD  1st authorization submitted 02-12-20 for 3 visits in 2 weeks reauthorize on 02-24-20 2nd re authorization on 02-24-20 3rd submit  04-22-20 thro 06-09-20    PT Start Time 1022    PT Stop Time 1101    PT Time Calculation (min) 39 min    Activity Tolerance Patient tolerated treatment well    Behavior During Therapy [facility] for tasks assessed/performed           Past Medical History:  Diagnosis Date  . Anxiety   . Back pain   . Bipolar 1 disorder ([facility])   . Chronic pain entered 08/15/2015   Treated with Oxycodone  . Depression   . Depression   . Dyspnea   . History of acute bronchitis   . HTN (hypertension)   . OA (osteoarthritis)   . Obesity   . PE (pulmonary embolism)    oct 2014  . Sciatica     Past Surgical History:  Procedure Laterality Date  . ADENOIDECTOMY    . BIOPSY STOMACH    . CARDIAC CATHETERIZATION  2009   normal; in [place]  . CESAREAN SECTION  2002   x 2, 1997 and 2002  . CHOLECYSTECTOMY  2007  . KNEE SURGERY Right 2005  . TONSILLECTOMY    . TOTAL KNEE ARTHROPLASTY Left 01/27/2020   Procedure: TOTAL KNEE ARTHROPLASTY;  Surgeon: [name], MD;  Location: [facility];  Service: Orthopedics;  Laterality: Left;  . TYMPANOSTOMY TUBE PLACEMENT      There were no vitals filed for this visit.   Subjective Assessment - 05/18/20 1030    Subjective I still have swelling in my knee    Pertinent History DDD, Sciatica, RTC syndrome, plantar fasciitis, carpal tunnel.  OA, obesity,  See medical History,  ALLERGIC to Percocet, Hydrocodone,Vicodin    Limitations Standing;Walking;House hold activities;Sitting    Diagnostic tests x ray    Patient Stated Goals I have 10, 19, 24 children and i want to get back and spend time with them, go to [facility] and exercise, cooking    Currently in Pain? Yes    Pain Score 2     Pain Location Knee    Pain Orientation Left    Pain Descriptors / Indicators Discomfort;Aching    Pain Type Chronic pain    Pain Onset More than a month ago    Pain Frequency Intermittent                             [facility] Adult PT Treatment/Exercise - 05/18/20 0001      Transfers   Transfers --    Comments --      Ambulation/Gait   Stairs Yes    Stairs Assistance 7: Independent    Stair Management Technique No rails;Alternating pattern    Number of Stairs 16    Height of Stairs 6    Gait Comments corrected side to side sway with walking , Pt tend sto  rock to side rather than forward acceleration out of habit than lack of ability  worked in Location manager for reinforcement      Therapeutic Activites    Therapeutic Activities Other Therapeutic Activities    Other Therapeutic Activities floor to mat xfers from stanidng to floor to q ped to tall kneeling and 1/2 kneeling using chair and without chair   Pt tends to lean trunk forward without chair support  Pt performed 5 separte transfers for practice      Manual Therapy   Kinesiotex Edema      Kinesiotix   Edema basketweave over LT knee                  PT Education - 05/18/20 1450    Education Details educated on how to perform floor to standing xfer and several techniques. KT tape for edema for knee gait on stairs with alternating technique    Person(s) Educated Patient    Methods Explanation;Demonstration    Comprehension Verbalized understanding;Returned demonstration            PT Short Term Goals - 05/11/20 1226      PT SHORT TERM GOAL #1   Title Independent with initial HEP     Time 2    Status Achieved    Target Date 02/26/20      PT SHORT TERM GOAL #2   Title AROM of knee extension -8 to 100 flexion to increase mobility for transitional movements    Baseline 0-122    Time 2    Period Weeks    Status Achieved    Target Date 02/26/20      PT SHORT TERM GOAL #3   Title Edema of LT knee will decreased from 50cm to 45 cm    Baseline LT 41 cm    Time 2    Period Weeks    Status Achieved    Target Date 02/26/20             PT Long Term Goals - 05/18/20 1455      PT LONG TERM GOAL #1   Title Pt will be independent with advanced HEP given    Time 7    Period Weeks    Status On-going      PT LONG TERM GOAL #2   Title Pt will be able to negotiate steps without exacerbation of pain greater than 2/10  and LRAD    Baseline alteranting steps without AD and one hand rail.  pain 5/10    Time 7    Period Weeks    Status On-going      PT LONG TERM GOAL #3   Title Pt will improve her LT knee flexion to  >/= 120 degrees and extension to </= 5 degrees with </= 2/10 pain for a more functional and efficient gait pattern    Baseline AROM 123 LT Flexion of knee  ext 0 pain level 2/10    Time 7    Period Weeks    Status On-going      PT LONG TERM GOAL #4   Title Pt will be able to perform standing to floor transfer in order to play with kids and to ensure safety in home    Baseline independent with floor to standing x fer    Time 7    Period Weeks    Status Achieved      PT LONG TERM GOAL #5   Title PT with be  able to walk/stand >/= 1 hour with no AD with </= 2/10 pain for functional endurance and return to shopping/leisure activities    Time 7    Status On-going                 Plan - 05/18/20 1825    Clinical Impression Statement Pt was able to achieve LTG #4 and able to floor to stand xfer x 5 today in clinic independently.  Pt was able to alternate steps on the stairs today.  Pt stil complains of 2/10 pain and swelling of the knee.  Pt used  KT tape for edema control and if it is beneficia will be taught how to utilize herself at home in preparation for DC.  Will continue to strengthen and work on Gait.    Personal Factors and Comorbidities Comorbidity 1;Comorbidity 2    Comorbidities DDD, Sciatica, RTC syndrome, plantar fasciitis, carpal tunnel.  OA, obesity,  See medical History, ALLERGIC to Percocet, Hydrocodone,Vicodin, Aleve    Examination-Activity Limitations Bend;Dressing;Hygiene/Grooming;Toileting;Stand;Stairs;Squat;Sleep;Transfers;Locomotion Level    PT Frequency 1x / week    PT Duration --   7 weeks   PT Treatment/Interventions Joint Manipulations;ADLs/Self Care Home Management;Cryotherapy;Electrical Stimulation;Ultrasound;Moist Heat;Iontophoresis 4mg /ml Dexamethasone;Gait training;Stair training;Therapeutic activities;Functional mobility training;Therapeutic exercise;Balance training;Patient/family education;Neuromuscular re-education;Manual techniques;Passive range of motion;Vasopneumatic Device;Taping;Dry needling    PT Next Visit Plan address abnormal gait pattern    PT Home Exercise Plan Phase 1 TKR exercises, [id number] hip abduction and bridge and clamshell step ups and lateral step ups and step downs    Consulted and Agree with Plan of Care Patient           Patient will benefit from skilled therapeutic intervention in order to improve the following deficits and impairments:  Difficulty walking, Decreased activity tolerance, Decreased balance, Decreased mobility, Decreased range of motion, Decreased strength, Increased edema, Increased muscle spasms, Postural dysfunction, Improper body mechanics, Pain, Obesity  Visit Diagnosis: Acute pain of left knee  Stiffness of left knee, not elsewhere classified  Muscle weakness (generalized)  Difficulty in walking, not elsewhere classified     Problem List Patient Active Problem List   Diagnosis Date Noted  . Primary osteoarthritis of left knee 12/29/2019  . Morbid  obesity ([facility])   . Bradycardia   . Pulmonary embolism on long-term anticoagulation therapy ([facility]) 04/06/2016  . Pain in the chest   . Chronic pain   . Chest wall pain   . Pulmonary embolism ([facility]) 07/31/2013  . Vocal cord dysfunction 02/28/2012  . Dyspnea 01/17/2012  . Chest pain 11/25/2011  . Hypokalemia 11/25/2011  . GERD (gastroesophageal reflux disease) 03/08/2011  . DYSMENORRHEA 11/10/2010  . 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 ROTATOR CUFF SYNDROME, RIGHT 04/11/2010  . VITAMIN D DEFICIENCY 02/18/2010  . Bipolar 1 disorder ([facility]) 01/17/2010  . Essential hypertension, benign 01/17/2010  . INSOMNIA 01/17/2010  . OBESITY, NOS 01/03/2007  . Depression 01/03/2007  . HEARING LOSS NOS OR DEAFNESS 01/03/2007  . EDEMA-LEGS,DUE TO VENOUS OBSTRUCT. 01/03/2007  . Irritable bowel syndrome 01/03/2007   01/05/2007, PT Certified Exercise Expert for the Aging Adult  05/18/20 6:32 PM Phone: [phone] Fax: [phone]  [facility] Outpatient Rehabilitation [facility] [address] [place], [place], [place] Phone: [phone]   Fax:  [phone]  Name: Katie [name] MRN: [name] Date of Birth: 07-Feb-1966

## 2020-05-25 ENCOUNTER — Other Ambulatory Visit: Payer: Self-pay

## 2020-05-25 ENCOUNTER — Ambulatory Visit: Payer: Medicaid Other | Admitting: Physical Therapy

## 2020-05-25 ENCOUNTER — Encounter: Payer: Self-pay | Admitting: Physical Therapy

## 2020-05-25 DIAGNOSIS — M25562 Pain in left knee: Secondary | ICD-10-CM | POA: Diagnosis not present

## 2020-05-25 DIAGNOSIS — M25662 Stiffness of left knee, not elsewhere classified: Secondary | ICD-10-CM

## 2020-05-25 DIAGNOSIS — R262 Difficulty in walking, not elsewhere classified: Secondary | ICD-10-CM

## 2020-05-25 DIAGNOSIS — M6281 Muscle weakness (generalized): Secondary | ICD-10-CM

## 2020-05-25 NOTE — Therapy (Signed)
[facility] Outpatient Rehabilitation [facility] [address] [place], [place], [address] Phone: [phone]   Fax:  [phone]  Physical Therapy Treatment  Patient Details  Name: Katie [name] MRN: [id number] Date of Birth: [birth year] Referring Provider (PT): Renaye Rakers MD   Encounter Date: 05/25/2020   PT End of Session - 05/25/20 1213    Visit Number 17    Number of Visits 21    Date for PT Re-Evaluation 06/01/20    Authorization Type MCD  1st authorization submitted 02-12-20 for 3 visits in 2 weeks reauthorize on 02-24-20 2nd re authorization on 02-24-20 3rd submit  04-22-20 thro 06-09-20    PT Start Time 1102    PT Stop Time 1147    PT Time Calculation (min) 45 min    Activity Tolerance Patient tolerated treatment well    Behavior During Therapy [facility] for tasks assessed/performed           Past Medical History:  Diagnosis Date   Anxiety    Back pain    Bipolar 1 disorder (HCC)    Chronic pain entered 08/15/2015   Treated with Oxycodone   Depression    Depression    Dyspnea    History of acute bronchitis    HTN (hypertension)    OA (osteoarthritis)    Obesity    PE (pulmonary embolism)    oct 2014   Sciatica     Past Surgical History:  Procedure Laterality Date   ADENOIDECTOMY     BIOPSY STOMACH     CARDIAC CATHETERIZATION  2009   normal; in [place]   CESAREAN SECTION  2002   x 2, 1997 and 2002   CHOLECYSTECTOMY  2007   KNEE SURGERY Right 2005   TONSILLECTOMY     TOTAL KNEE ARTHROPLASTY Left 01/27/2020   Procedure: TOTAL KNEE ARTHROPLASTY;  Surgeon: [name], MD;  Location: [facility];  Service: Orthopedics;  Laterality: Left;   TYMPANOSTOMY TUBE PLACEMENT      There were no vitals filed for this visit.   Subjective Assessment - 05/25/20 1115    Subjective KT tape really helped my swelling.  I love that  I was able to walk more easily and I could tell that my swelling improved.    Pertinent History DDD,  Sciatica, RTC syndrome, plantar fasciitis, carpal tunnel.  OA, obesity,  See medical History, ALLERGIC to Percocet, Hydrocodone,Vicodin    Limitations Standing;Walking;House hold activities;Sitting    Patient Stated Goals I have 10, 19, 24 children and i want to get back and spend time with them, go to [facility] and exercise, cooking    Currently in Pain? No/denies    Pain Score 0-No pain    Pain Location Knee              [facility] PT Assessment - 05/25/20 0001      Assessment   Medical Diagnosis LT TKR    Referring Provider (PT) Renaye Rakers MD    Onset Date/Surgical Date 01/27/20      AROM   Left Knee Extension 0    Left Knee Flexion 123      PROM   Left Knee Extension +3    Left Knee Flexion 126                         [facility] Adult PT Treatment/Exercise - 05/25/20 0001      Ambulation/Gait   Ambulation Distance (Feet) 400 Feet  Gait Pattern Step-through pattern    Ambulation Surface Level    Gait velocity - backwards walking forwards and backwards in  ll bars with mirror as visuall cue    Gait Comments Pt with improved forward translation using mirror  Pt able to self correct when tending to walk with side to side sway on her own this week.      Knee/Hip Exercises: Machines for Strengthening   Total Gym Leg Press 2 x 10 60 lb bil  2 x 10 with 20 lb with LT leg      Knee/Hip Exercises: Standing   Heel Raises Limitations x20    Lateral Step Up 2 sets;10 reps;Hand Hold: 2;Step Height: 6";Right;Left    Forward Step Up Left;2 sets;10 reps;Hand Hold: 2;Step Height: 6"    Other Standing Knee Exercises goblet squat 20 lb 2 x 10    Other Standing Knee Exercises hamstring walk 20 ft x 2 . lunge walk 4 x 20 feet       Kinesiotix   Edema basketweave over LT knee   pt return demo for home use                 PT Education - 05/25/20 1212    Education Details reinforced proper gait with mirror and VC,  educated and return demo of basketweave KT tape for Edema      Person(s) Educated Patient    Methods Explanation;Demonstration    Comprehension Verbalized understanding;Returned demonstration            PT Short Term Goals - 05/11/20 1226      PT SHORT TERM GOAL #1   Title Independent with initial HEP    Time 2    Status Achieved    Target Date 02/26/20      PT SHORT TERM GOAL #2   Title AROM of knee extension -8 to 100 flexion to increase mobility for transitional movements    Baseline 0-122    Time 2    Period Weeks    Status Achieved    Target Date 02/26/20      PT SHORT TERM GOAL #3   Title Edema of LT knee will decreased from 50cm to 45 cm    Baseline LT 41 cm    Time 2    Period Weeks    Status Achieved    Target Date 02/26/20             PT Long Term Goals - 05/18/20 1455      PT LONG TERM GOAL #1   Title Pt will be independent with advanced HEP given    Time 7    Period Weeks    Status On-going      PT LONG TERM GOAL #2   Title Pt will be able to negotiate steps without exacerbation of pain greater than 2/10  and LRAD    Baseline alteranting steps without AD and one hand rail.  pain 5/10    Time 7    Period Weeks    Status On-going      PT LONG TERM GOAL #3   Title Pt will improve her LT knee flexion to  >/= 120 degrees and extension to </= 5 degrees with </= 2/10 pain for a more functional and efficient gait pattern    Baseline AROM 123 LT Flexion of knee  ext 0 pain level 2/10    Time 7    Period Weeks    Status On-going  PT LONG TERM GOAL #4   Title Pt will be able to perform standing to floor transfer in order to play with kids and to ensure safety in home    Baseline independent with floor to standing x fer    Time 7    Period Weeks    Status Achieved      PT LONG TERM GOAL #5   Title PT with be able to walk/stand >/= 1 hour with no AD with </= 2/10 pain for functional endurance and return to shopping/leisure activities    Time 7    Status On-going                 Plan - 05/25/20  1214    Clinical Impression Statement Pt enters clinic with no pain and minimal swelling.  Pt educated and returned demo of KT edema /basketweave for home use.  Pt is able to self correct with gait compensations she performs out of habit .  Pt has lost 110 lbs and is able to lunge to ground now. AROM of LT knee now 0 - 125 after exericises. Pt will join fitness club and will be ready for DC next visit    Personal Factors and Comorbidities Comorbidity 1;Comorbidity 2    Comorbidities DDD, Sciatica, RTC syndrome, plantar fasciitis, carpal tunnel.  OA, obesity,  See medical History, ALLERGIC to Percocet, Hydrocodone,Vicodin, Aleve    Examination-Activity Limitations Bend;Dressing;Hygiene/Grooming;Toileting;Stand;Stairs;Squat;Sleep;Transfers;Locomotion Level    PT Frequency 1x / week    PT Duration --   9 weeks   PT Treatment/Interventions Joint Manipulations;ADLs/Self Care Home Management;Cryotherapy;Electrical Stimulation;Ultrasound;Moist Heat;Iontophoresis 4mg /ml Dexamethasone;Gait training;Stair training;Therapeutic activities;Functional mobility training;Therapeutic exercise;Balance training;Patient/family education;Neuromuscular re-education;Manual techniques;Passive range of motion;Vasopneumatic Device;Taping;Dry needling    PT Next Visit Plan DC next visit review HEP    PT Home Exercise Plan Phase 1 TKR exercises, [id number] hip abduction and bridge and clamshell step ups and lateral step ups and step downs    Consulted and Agree with Plan of Care Patient           Patient will benefit from skilled therapeutic intervention in order to improve the following deficits and impairments:  Difficulty walking, Decreased activity tolerance, Decreased balance, Decreased mobility, Decreased range of motion, Decreased strength, Increased edema, Increased muscle spasms, Postural dysfunction, Improper body mechanics, Pain, Obesity  Visit Diagnosis: Acute pain of left knee  Stiffness of left knee, not  elsewhere classified  Muscle weakness (generalized)  Difficulty in walking, not elsewhere classified     Problem List Patient Active Problem List   Diagnosis Date Noted   Primary osteoarthritis of left knee 12/29/2019   Morbid obesity (HCC)    Bradycardia    Pulmonary embolism on long-term anticoagulation therapy (HCC) 04/06/2016   Pain in the chest    Chronic pain    Chest wall pain    Pulmonary embolism (HCC) 07/31/2013   Vocal cord dysfunction 02/28/2012   Dyspnea 01/17/2012   Chest pain 11/25/2011   Hypokalemia 11/25/2011   GERD (gastroesophageal reflux disease) 03/08/2011   DYSMENORRHEA 11/10/2010   PLANTAR FASCIITIS 09/14/2010   OSTEOARTHRITIS, KNEES, BILATERAL, 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 01/03/2007   Irritable bowel syndrome 01/03/2007   Garen LahLawrie Swannie Milius, PT Certified Exercise  Expert for the Aging Adult  05/25/20 1:21 PM Phone: [phone] Fax: [phone]  [facility] Outpatient Rehabilitation [facility] [address] [place], [place], [address] Phone: [phone]   Fax:  [phone]  Name: Katie [name] MRN: [id number] Date of Birth: 11/20/1965

## 2020-06-01 ENCOUNTER — Ambulatory Visit: Payer: Medicaid Other | Admitting: Physical Therapy

## 2020-06-01 ENCOUNTER — Other Ambulatory Visit: Payer: Self-pay

## 2020-06-01 DIAGNOSIS — R262 Difficulty in walking, not elsewhere classified: Secondary | ICD-10-CM

## 2020-06-01 DIAGNOSIS — M6281 Muscle weakness (generalized): Secondary | ICD-10-CM

## 2020-06-01 DIAGNOSIS — M25562 Pain in left knee: Secondary | ICD-10-CM

## 2020-06-01 DIAGNOSIS — M25662 Stiffness of left knee, not elsewhere classified: Secondary | ICD-10-CM

## 2020-06-01 NOTE — Therapy (Signed)
[place], [place], [address] Phone: [phone]   Fax:  [phone]  Physical Therapy Treatment/Discharge Note  Patient Details  Name: Katie [name] MRN: [id number] Date of Birth: October 25, 1966 Referring Provider (PT): Fredonia Highland MD   Encounter Date: 06/01/2020   PT End of Session - 06/01/20 1110    Visit Number 18    Number of Visits 21    Date for PT Re-Evaluation 06/01/20    Authorization Type MCD  1st authorization submitted 02-12-20 for 3 visits in 2 weeks reauthorize on 02-24-20 2nd re authorization on 02-24-20 3rd submit  04-22-20 thro 06-09-20    PT Start Time 1102    PT Stop Time 1144    PT Time Calculation (min) 42 min    Activity Tolerance Patient tolerated treatment well    Behavior During Therapy [facility] for tasks assessed/performed           Past Medical History:  Diagnosis Date  . Anxiety   . Back pain   . Bipolar 1 disorder ([place])   . Chronic pain entered 08/15/2015   Treated with Oxycodone  . Depression   . Depression   . Dyspnea   . History of acute bronchitis   . HTN (hypertension)   . OA (osteoarthritis)   . Obesity   . PE (pulmonary embolism)    oct 2014  . Sciatica     Past Surgical History:  Procedure Laterality Date  . ADENOIDECTOMY    . BIOPSY STOMACH    . CARDIAC CATHETERIZATION  2009   normal; in [place]  . CESAREAN SECTION  2002   x 2, 1997 and 2002  . CHOLECYSTECTOMY  2007  . KNEE SURGERY Right 2005  . TONSILLECTOMY    . TOTAL KNEE ARTHROPLASTY Left 01/27/2020   Procedure: TOTAL KNEE ARTHROPLASTY;  Surgeon: Renette Butters, MD;  Location: [facility];  Service: Orthopedics;  Laterality: Left;  . TYMPANOSTOMY TUBE PLACEMENT      There were no vitals filed for this visit.   Subjective Assessment - 06/01/20 1104    Subjective I was playing with my grandbaby today and I actually tweaked my neck muscle and I used a heat pack but my knee is great    Pertinent History  DDD, Sciatica, RTC syndrome, plantar fasciitis, carpal tunnel.  OA, obesity,  See medical History, ALLERGIC to Percocet, Hydrocodone,Vicodin    Limitations Standing;Walking;House hold activities;Sitting    How long can you stand comfortably? 15 minutes and then knee swells    How long can you walk comfortably? 15 -20 minutes before swelling    Diagnostic tests x ray    Patient Stated Goals I have 10, 19, 24 children and i want to get back and spend time with them, go to [facility] and exercise, cooking    Currently in Pain? No/denies    Pain Score 0-No pain    Pain Location Knee    Pain Orientation Left              [facility] PT Assessment - 06/01/20 0001      Assessment   Medical Diagnosis LT TKR    Referring Provider (PT) Fredonia Highland MD    Onset Date/Surgical Date 01/27/20      Functional Tests   Functional tests Hopping;Running      Hopping   Comments able to hop double feet 5 x in 10 feet      Floor to Stand   Comments  able to transfer from floor to standing independently       AROM   Left Knee Extension 0    Left Knee Flexion 125      PROM   Left Knee Extension +3    Left Knee Flexion 127      Strength   Right Hip Flexion 5/5    Right Hip Extension 4+/5    Right Hip ABduction 4+/5    Left Hip Flexion 4+/5    Left Hip Extension 4+/5    Left Hip ABduction 4+/5    Right Knee Flexion 5/5    Right Knee Extension 5/5    Left Knee Flexion 5/5    Left Knee Extension 5/5                         [facility] Adult PT Treatment/Exercise - 06/01/20 0001      Ambulation/Gait   Ambulation Distance (Feet) 300 Feet    Stairs Yes    Stairs Assistance 7: Independent    Stair Management Technique No rails;Alternating pattern    Number of Stairs 16    Height of Stairs 6    Gait Comments Pt with no pain.       Knee/Hip Exercises: Aerobic   Recumbent Bike  6 minutes RPE 5-/6 of 10      Knee/Hip Exercises: Machines for Strengthening   Total Gym Leg Press 2 x 10 60 lb  bil  2 x 10 with 20 lb with LT leg      Knee/Hip Exercises: Standing   Lateral Step Up 2 sets;10 reps;Hand Hold: 2;Step Height: 6";Right;Left    Lateral Step Up Limitations 1 x 10 RT and the LT of lateral lunge    Forward Step Up Left;2 sets;10 reps;Hand Hold: 2;Step Height: 6"    Step Down 1 set;10 reps;Hand Hold: 1;Step Height: 4"    SLS bil heel raise 30 x  15 x SLS RT and then LT    Other Standing Knee Exercises goblet squat 20 lb 2 x 8    Other Standing Knee Exercises deadlift 30# KB 2 x 8      Manual Therapy   Kinesiotex Edema      Kinesiotix   Edema basketweave over LT knee                    PT Short Term Goals - 05/11/20 1226      PT SHORT TERM GOAL #1   Title Independent with initial HEP    Time 2    Status Achieved    Target Date 02/26/20      PT SHORT TERM GOAL #2   Title AROM of knee extension -8 to 100 flexion to increase mobility for transitional movements    Baseline 0-122    Time 2    Period Weeks    Status Achieved    Target Date 02/26/20      PT SHORT TERM GOAL #3   Title Edema of LT knee will decreased from 50cm to 45 cm    Baseline LT 41 cm    Time 2    Period Weeks    Status Achieved    Target Date 02/26/20             PT Long Term Goals - 06/01/20 1107      PT LONG TERM GOAL #1   Title Pt will be independent with advanced HEP given  Baseline Independent with advanced    Time 7    Period Weeks    Status Achieved      PT LONG TERM GOAL #2   Title Pt will be able to negotiate steps without exacerbation of pain greater than 2/10  and LRAD    Baseline alternating steps with one hand rail for safety  0/10 pain    Time 7    Period Weeks    Status Achieved      PT LONG TERM GOAL #3   Title Pt will improve her LT knee flexion to  >/= 120 degrees and extension to </= 5 degrees with </= 2/10 pain for a more functional and efficient gait pattern    Baseline AROM 125 LT Flexion of knee  ext 0 pain level    Time 7    Period  Weeks    Status Achieved      PT LONG TERM GOAL #4   Title Pt will be able to perform standing to floor transfer in order to play with kids and to ensure safety in home    Baseline independent with floor to standing x fer    Time 7    Period Weeks    Status Achieved      PT LONG TERM GOAL #5   Title PT with be able to walk/stand >/= 1 hour with no AD with </= 2/10 pain for functional endurance and return to shopping/leisure activities    Baseline Pt is more limited by swelling than pain but able to shop at [facility]  Pain 0/10    Time 7    Period Weeks    Status Achieved                 Plan - 06/01/20 1459    Clinical Impression Statement Pt enters clinic iwth no pain and complaints of swelling but pt is able to use KT tape and is informed of neoprene sleeve.  Pt understands importance of continuing to move for health of knee. Pt is independent with gait on steps with alternating steps and no need to use rails for demo but does use for safety. Pt has already joined [employer] and will continue strnegthening and utiizing exericise for weight control. Pt reviewed HEP and added more challenge.   Pt has lost 100 lb and now is able to participate in more exericise programs at the [facility] now that her knee is able to bend.  Knee AROM is 0 to 125 /PROM 127 ,  Pt no longer needs any assistive device and was abale to hop 5 bil hops in 10 feet today and was not able to at beginning of RX.  Pt has achieved all LTG's and will be DC today with excellent resuls.    Personal Factors and Comorbidities Comorbidity 1;Comorbidity 2    Comorbidities DDD, Sciatica, RTC syndrome, plantar fasciitis, carpal tunnel.  OA, obesity,  See medical History, ALLERGIC to Percocet, Hydrocodone,Vicodin, Aleve    Examination-Activity Limitations Bend;Dressing;Hygiene/Grooming;Toileting;Stand;Stairs;Squat;Sleep;Transfers;Locomotion Level    PT Treatment/Interventions Joint Manipulations;ADLs/Self Care Home  Management;Cryotherapy;Electrical Stimulation;Ultrasound;Moist Heat;Iontophoresis '4mg'$ /ml Dexamethasone;Gait training;Stair training;Therapeutic activities;Functional mobility training;Therapeutic exercise;Balance training;Patient/family education;Neuromuscular re-education;Manual techniques;Passive range of motion;Vasopneumatic Device;Taping;Dry needling    PT Next Visit Plan DC    PT Home Exercise Plan Phase 1 TKR exercises, [id number] hip abduction and bridge and clamshell step ups and lateral step ups and step downs    Consulted and Agree with Plan of Care Patient  Patient will benefit from skilled therapeutic intervention in order to improve the following deficits and impairments:  Difficulty walking, Decreased activity tolerance, Decreased balance, Decreased mobility, Decreased range of motion, Decreased strength, Increased edema, Increased muscle spasms, Postural dysfunction, Improper body mechanics, Pain, Obesity  Visit Diagnosis: Acute pain of left knee  Stiffness of left knee, not elsewhere classified  Muscle weakness (generalized)  Difficulty in walking, not elsewhere classified     Problem List Patient Active Problem List   Diagnosis Date Noted  . Primary osteoarthritis of left knee 12/29/2019  . Morbid obesity ([place])   . Bradycardia   . Pulmonary embolism on long-term anticoagulation therapy ([place]) 04/06/2016  . Pain in the chest   . Chronic pain   . Chest wall pain   . Pulmonary embolism ([place]) 07/31/2013  . Vocal cord dysfunction 02/28/2012  . Dyspnea 01/17/2012  . Chest pain 11/25/2011  . Hypokalemia 11/25/2011  . GERD (gastroesophageal reflux disease) 03/08/2011  . DYSMENORRHEA 11/10/2010  . 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 ROTATOR CUFF SYNDROME, RIGHT 04/11/2010  . VITAMIN D DEFICIENCY 02/18/2010  . Bipolar 1 disorder ([place]) 01/17/2010  . Essential hypertension, benign 01/17/2010  . INSOMNIA  01/17/2010  . OBESITY, NOS 01/03/2007  . Depression 01/03/2007  . HEARING LOSS NOS OR DEAFNESS 01/03/2007  . EDEMA-LEGS,DUE TO VENOUS OBSTRUCT. 01/03/2007  . Irritable bowel syndrome 01/03/2007    Voncille Lo, PT Certified Exercise Expert for the Aging Adult  06/01/20 3:06 PM Phone: [phone] Fax: [facility] [address] [place], [place], [address] Phone: [phone]   Fax:  [phone]   PHYSICAL THERAPY DISCHARGE SUMMARY  Visits from Start of Care: 18  Current functional level related to goals / functional outcomes: As above   Remaining deficits: 0-125 AROM but still has swelling but able to use KT tape for swelling   Education / Equipment: HEP at at [employer] [place] [facility] Plan: Patient agrees to discharge.  Patient goals were met. Patient is being discharged due to meeting the stated rehab goals.  ?????    And being pleased with current functional progress.  Name: Katie [name] MRN: [id number] Date of Birth: 1966/01/04  Voncille Lo, PT Certified Exercise Expert for the Aging Adult  06/01/20 3:06 PM Phone: [phone] Fax: [phone]

## 2020-08-30 IMAGING — DX DG FINGER MIDDLE 2+V*L*
3 series · 3 of 3 positions shown · non-contrast
Comparison: 06/16/2011

CLINICAL DATA: Finger injury with pain and swelling to PIP joint

EXAM:
LEFT MIDDLE FINGER 2+V

[finger ap]
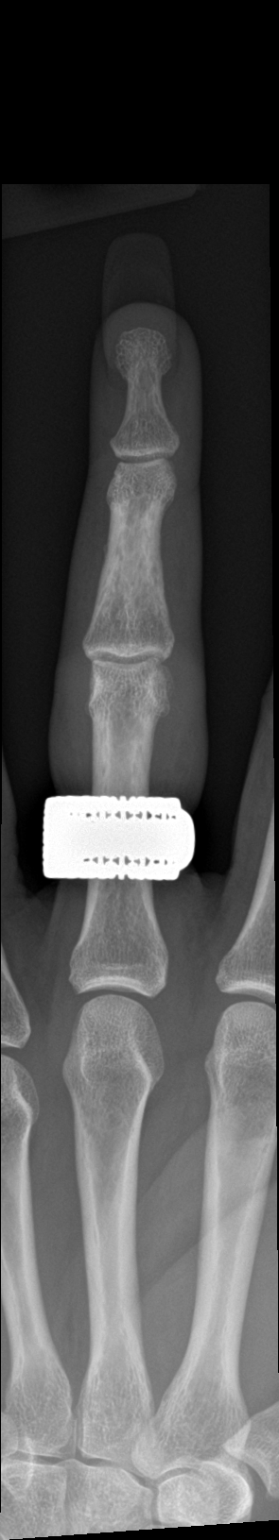

[finger obl]
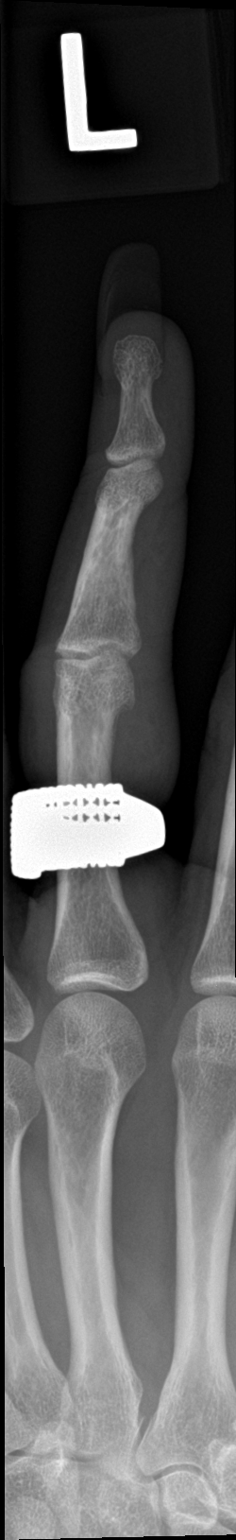

[finger lat]
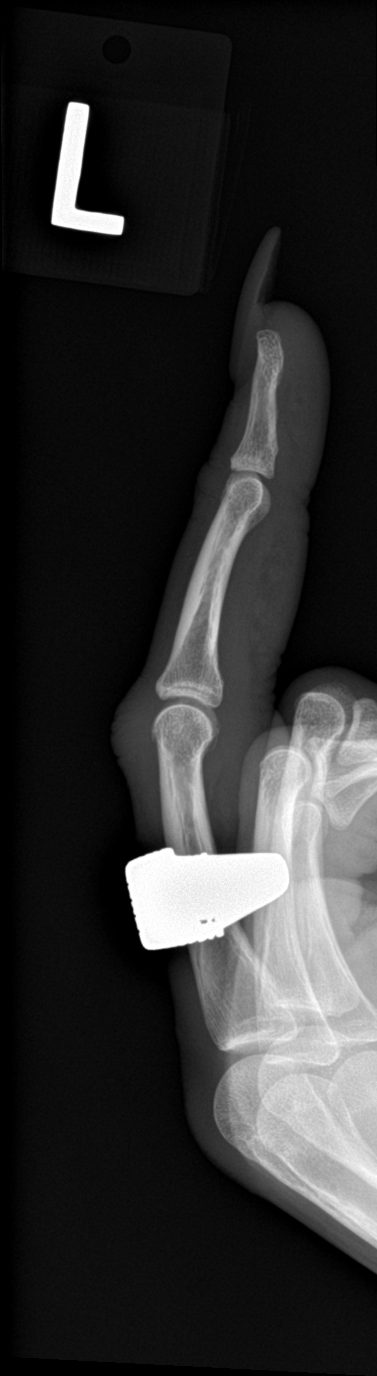

[3 of 3 positions shown; findings below may reference images not displayed]

FINDINGS: There is no evidence of fracture or dislocation. There is no
evidence of arthropathy or other focal bone abnormality. Soft tissue
swelling at the IP joint.
IMPRESSION: No acute osseous abnormality.

## 2020-09-07 DIAGNOSIS — M25559 Pain in unspecified hip: Secondary | ICD-10-CM | POA: Diagnosis not present

## 2020-09-07 DIAGNOSIS — F419 Anxiety disorder, unspecified: Secondary | ICD-10-CM | POA: Diagnosis not present

## 2020-09-07 DIAGNOSIS — E119 Type 2 diabetes mellitus without complications: Secondary | ICD-10-CM | POA: Diagnosis not present

## 2020-10-01 ENCOUNTER — Emergency Department (HOSPITAL_COMMUNITY): Payer: Medicaid Other

## 2020-10-01 ENCOUNTER — Encounter (HOSPITAL_COMMUNITY): Payer: Self-pay | Admitting: Emergency Medicine

## 2020-10-01 ENCOUNTER — Emergency Department (HOSPITAL_COMMUNITY)
Admission: EM | Admit: 2020-10-01 | Discharge: 2020-10-01 | Disposition: A | Discharge status: Home / Self Care | Payer: Medicaid Other | Attending: Emergency Medicine | Admitting: Emergency Medicine

## 2020-10-01 ENCOUNTER — Other Ambulatory Visit: Payer: Self-pay

## 2020-10-01 DIAGNOSIS — M25551 Pain in right hip: Secondary | ICD-10-CM | POA: Diagnosis not present

## 2020-10-01 DIAGNOSIS — Z86711 Personal history of pulmonary embolism: Secondary | ICD-10-CM | POA: Diagnosis not present

## 2020-10-01 DIAGNOSIS — Z96652 Presence of left artificial knee joint: Secondary | ICD-10-CM | POA: Insufficient documentation

## 2020-10-01 DIAGNOSIS — W010XXA Fall on same level from slipping, tripping and stumbling without subsequent striking against object, initial encounter: Secondary | ICD-10-CM | POA: Diagnosis not present

## 2020-10-01 DIAGNOSIS — D689 Coagulation defect, unspecified: Secondary | ICD-10-CM | POA: Diagnosis not present

## 2020-10-01 DIAGNOSIS — Z7901 Long term (current) use of anticoagulants: Secondary | ICD-10-CM | POA: Diagnosis not present

## 2020-10-01 DIAGNOSIS — Y92000 Kitchen of unspecified non-institutional (private) residence as  the place of occurrence of the external cause: Secondary | ICD-10-CM | POA: Insufficient documentation

## 2020-10-01 DIAGNOSIS — R519 Headache, unspecified: Secondary | ICD-10-CM | POA: Diagnosis not present

## 2020-10-01 DIAGNOSIS — I10 Essential (primary) hypertension: Secondary | ICD-10-CM | POA: Diagnosis not present

## 2020-10-01 DIAGNOSIS — J90 Pleural effusion, not elsewhere classified: Secondary | ICD-10-CM | POA: Diagnosis not present

## 2020-10-01 DIAGNOSIS — S0990XA Unspecified injury of head, initial encounter: Secondary | ICD-10-CM | POA: Diagnosis not present

## 2020-10-01 DIAGNOSIS — M25511 Pain in right shoulder: Secondary | ICD-10-CM | POA: Diagnosis not present

## 2020-10-01 DIAGNOSIS — R001 Bradycardia, unspecified: Secondary | ICD-10-CM | POA: Diagnosis not present

## 2020-10-01 DIAGNOSIS — Z79899 Other long term (current) drug therapy: Secondary | ICD-10-CM | POA: Insufficient documentation

## 2020-10-01 DIAGNOSIS — W19XXXA Unspecified fall, initial encounter: Secondary | ICD-10-CM

## 2020-10-01 MED ORDER — CYCLOBENZAPRINE HCL 10 MG PO TABS: 10.0000 mg | ORAL_TABLET | Freq: Two times a day (BID) | ORAL | 0 refills | Status: DC | PRN

## 2020-10-01 MED ORDER — FENTANYL CITRATE (PF) 100 MCG/2ML IJ SOLN
50.0000 ug | Freq: Once | INTRAMUSCULAR | Status: AC
  Administered 2020-10-01: 50 ug via NASAL
  Filled 2020-10-01: qty 2

## 2020-10-01 NOTE — ED Triage Notes (Signed)
Pt states she fell yesterday- slipped on some grease in the kitchen. Pt did not hit head or lose consciousness. Pt fell on right side- complains of right hip, shoulder, hand, forearm pain.

## 2020-10-01 NOTE — ED Notes (Signed)
Pt transported to CT and Xray. 

## 2020-10-01 NOTE — ED Provider Notes (Signed)
[facility] EMERGENCY DEPARTMENT Provider Note   CSN: [id number] Arrival date & time: 10/01/20  1751     History Chief Complaint  Patient presents with  . Fall    Katie [name] is a 54 y.o. female.  HPI    54 year old female history of PE chronically anticoagulated on Xarelto who fell yesterday afternoon.  She states that she slipped and her feet brace in her kitchen.  She fell backwards into a sitting position.  She is complaining of pain in her shoulder right side of her chest and right hip.  She states she did does not clear exactly what she hit.  She thought that she landed in sitting position, but with the other pain she is not clear what happened exactly.  She did not lose consciousness.  She has been ambulatory since.  She has no obvious deformities.  She has not had any bleeding. Past Medical History:  Diagnosis Date  . Anxiety   . Back pain   . Bipolar 1 disorder (HCC)   . Chronic pain entered 08/15/2015   Treated with Oxycodone  . Depression   . Depression   . Dyspnea   . History of acute bronchitis   . HTN (hypertension)   . OA (osteoarthritis)   . Obesity   . PE (pulmonary embolism)    oct 2014  . Sciatica     Patient Active Problem List   Diagnosis Date Noted  . Primary osteoarthritis of left knee 12/29/2019  . Morbid obesity (HCC)   . Bradycardia   . Pulmonary embolism on long-term anticoagulation therapy (HCC) 04/06/2016  . Pain in the chest   . Chronic pain   . Chest wall pain   . Pulmonary embolism (HCC) 07/31/2013  . Vocal cord dysfunction 02/28/2012  . Dyspnea 01/17/2012  . Chest pain 11/25/2011  . Hypokalemia 11/25/2011  . GERD (gastroesophageal reflux disease) 03/08/2011  . DYSMENORRHEA 11/10/2010  . 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 ROTATOR CUFF SYNDROME, RIGHT 04/11/2010  . VITAMIN D DEFICIENCY 02/18/2010  . Bipolar 1 disorder (HCC) 01/17/2010  . Essential  hypertension, benign 01/17/2010  . INSOMNIA 01/17/2010  . OBESITY, NOS 01/03/2007  . Depression 01/03/2007  . HEARING LOSS NOS OR DEAFNESS 01/03/2007  . EDEMA-LEGS,DUE TO VENOUS OBSTRUCT. 01/03/2007  . Irritable bowel syndrome 01/03/2007    Past Surgical History:  Procedure Laterality Date  . ADENOIDECTOMY    . BIOPSY STOMACH    . CARDIAC CATHETERIZATION  2009   normal; in [place]  . CESAREAN SECTION  2002   x 2, 1997 and 2002  . CHOLECYSTECTOMY  2007  . KNEE SURGERY Right 2005  . TONSILLECTOMY    . TOTAL KNEE ARTHROPLASTY Left 01/27/2020   Procedure: TOTAL KNEE ARTHROPLASTY;  Surgeon: [name], MD;  Location: [facility];  Service: Orthopedics;  Laterality: Left;  . TYMPANOSTOMY TUBE PLACEMENT       OB History   No obstetric history on file.     Family History  Problem Relation Age of Onset  . Heart disease Mother   . Diabetes Father   . Asthma Sister        also had rheumatic fever as a child, died age 55    Social History   Tobacco Use  . Smoking status: Never Smoker  . Smokeless tobacco: Never Used  . Tobacco comment: second hand smoke  Vaping Use  . Vaping Use: Never used  Substance Use  Topics  . Alcohol use: Yes    Alcohol/week: 0.0 standard drinks    Comment: 2-3 x week has a glass of wine  . Drug use: Yes    Frequency: 1.0 times per week    Types: Marijuana    Home Medications Prior to Admission medications   Medication Sig Start Date End Date Taking? Authorizing Provider  diazepam (VALIUM) 5 MG tablet Take 5 mg by mouth in the morning and at bedtime.  03/02/16   [provider]  docusate sodium (COLACE) 100 MG capsule Take 1 capsule (100 mg total) by mouth 2 (two) times daily. To prevent constipation while taking pain medication. 01/27/20   Albina BilletMartensen, Henry Calvin III, PA-C  furosemide (LASIX) 40 MG tablet Take 1 tablet (40 mg total) by mouth daily. 08/02/13   Zannie CoveJoseph, Preetha, MD  gabapentin (NEURONTIN) 300 MG capsule Take 1 capsule  (300 mg total) by mouth 3 (three) times daily as needed for up to 14 days. For 2 weeks post op for pain. 01/27/20 02/10/20  Albina BilletMartensen, Henry Calvin III, PA-C  methocarbamol (ROBAXIN) 500 MG tablet Take 1 tablet (500 mg total) by mouth every 8 (eight) hours as needed for muscle spasms. 01/27/20   Albina BilletMartensen, Henry Calvin III, PA-C  omeprazole (PRILOSEC) 40 MG capsule Take 40 mg by mouth daily.    [provider]  ondansetron (ZOFRAN) 4 MG tablet Take 1 tablet (4 mg total) by mouth every 8 (eight) hours as needed for nausea or vomiting. 01/27/20   Albina BilletMartensen, Henry Calvin III, PA-C  phentermine (ADIPEX-P) 37.5 MG tablet Take 37.5 mg by mouth every morning.     [provider]  potassium chloride SA (K-DUR,KLOR-CON) 20 MEQ tablet Take 20 mEq by mouth 2 (two) times daily. 05/03/16   [provider]  Rivaroxaban (XARELTO) 20 MG TABS tablet Take 1 tablet (20 mg total) by mouth daily with supper. Starting 10/18, take 20mg  daily Patient taking differently: Take 20 mg by mouth daily with supper.  08/23/13   Zannie CoveJoseph, Preetha, MD    Allergies    Percocet [oxycodone-acetaminophen], Tylenol [acetaminophen], and Vicodin [hydrocodone-acetaminophen]  Review of Systems   Review of Systems  All other systems reviewed and are negative.   Physical Exam Updated Vital Signs BP 121/83   Pulse (!) 58   Temp 98 F (36.7 C) (Oral)   Resp 14   Ht 1.651 m (5\' 5" )   Wt 86.2 kg   LMP 12/28/2015 (Exact Date)   SpO2 99%   BMI 31.62 kg/m   Physical Exam Vitals and nursing note reviewed.  Constitutional:      Appearance: Normal appearance.  HENT:     Head: Normocephalic and atraumatic.     Right Ear: External ear normal.     Left Ear: External ear normal.     Nose: Nose normal.     Mouth/Throat:     Mouth: Mucous membranes are moist.  Eyes:     Extraocular Movements: Extraocular movements intact.     Pupils: Pupils are equal, round, and reactive to light.  Cardiovascular:     Rate and  Rhythm: Normal rate.  Pulmonary:     Effort: Pulmonary effort is normal.     Breath sounds: Normal breath sounds.  Abdominal:     General: Abdomen is flat.     Palpations: Abdomen is soft.  Musculoskeletal:     Cervical back: Normal range of motion and neck supple.     Comments: No deformity or external signs of  trauma noted on any extremities. There is mild tenderness about the right shoulder but full active range of motion. There is mild tenderness to the right hip area but active full range of motion the patient has been ambulatory. No signs of trauma to back with no tenderness palpation over cervical, thoracic, or lumbar spine. Pelvis appears stable No bruising is noted  Skin:    General: Skin is warm.     Capillary Refill: Capillary refill takes less than 2 seconds.  Neurological:     General: No focal deficit present.     Mental Status: She is alert.     Cranial Nerves: No cranial nerve deficit.     Sensory: No sensory deficit.     Motor: No weakness.     Coordination: Coordination normal.  Psychiatric:        Mood and Affect: Mood normal.     ED Results / Procedures / Treatments   Labs (all labs ordered are listed, but only abnormal results are displayed) Labs Reviewed - No data to display  EKG None  Radiology DG Chest 1 View  Result Date: 10/01/2020 CLINICAL DATA:  Fall EXAM: CHEST  1 VIEW COMPARISON:  07/03/2019 FINDINGS: The heart size and mediastinal contours are within normal limits. No focal airspace consolidation, pleural effusion, or pneumothorax. The visualized skeletal structures are unremarkable. IMPRESSION: No active disease. Electronically Signed   By: Duanne Guess D.O.   On: 10/01/2020 09:42   DG Shoulder Right  Result Date: 10/01/2020 CLINICAL DATA:  Right shoulder pain after fall EXAM: RIGHT SHOULDER - 2+ VIEW COMPARISON:  None. FINDINGS: There is no evidence of fracture or dislocation. There is no evidence of arthropathy or other focal bone  abnormality. Soft tissues are unremarkable. IMPRESSION: Negative. Electronically Signed   By: Duanne Guess D.O.   On: 10/01/2020 09:40   CT Head Wo Contrast  Result Date: 10/01/2020 CLINICAL DATA:  Head trauma, coagulopathy. EXAM: CT HEAD WITHOUT CONTRAST TECHNIQUE: Contiguous axial images were obtained from the base of the skull through the vertex without intravenous contrast. COMPARISON:  None. FINDINGS: Brain: No evidence of acute large vascular territory infarction, hemorrhage, hydrocephalus, extra-axial collection or mass lesion/mass effect. Vascular: No hyperdense vessel or unexpected calcification. Skull: No acute fracture. Sinuses/Orbits: No acute finding. Other: No mastoid effusions. IMPRESSION: No evidence of acute intracranial abnormality. Electronically Signed   By: Feliberto Harts MD   On: 10/01/2020 10:09   DG Hip Unilat W or Wo Pelvis 2-3 Views Right  Result Date: 10/01/2020 CLINICAL DATA:  Right hip pain after fall EXAM: DG HIP (WITH OR WITHOUT PELVIS) 2-3V RIGHT COMPARISON:  12/27/2016 FINDINGS: There is no evidence of hip fracture or dislocation. There is no evidence of arthropathy or other focal bone abnormality. IMPRESSION: Negative. Electronically Signed   By: Duanne Guess D.O.   On: 10/01/2020 09:41    Procedures Procedures (including critical care time)  Medications Ordered in ED Medications  fentaNYL (SUBLIMAZE) injection 50 mcg (50 mcg Nasal Given 10/01/20 0910)    ED Course  I have reviewed the triage vital signs and the nursing notes.  Pertinent labs & imaging results that were available during my care of the patient were reviewed by me and considered in my medical decision making (see chart for details).    MDM Rules/Calculators/A&P                          Patient with fall yesterday and on blood  thinners.  She is unclear as to whether she struck her head.  Head CT obtained shows no evidence of bleeding.  She is having some pain in the right hip,  right shoulder, and anterior chest.  X-rays obtained and no evidence of acute abnormality including bone fractures are noted.  Patient given dose of fentanyl here.  Plan ibuprofen, which she takes for pain at home, skeletal muscle relaxants.  Patient advised of return precautions and need for follow-up and voiced understanding. Final Clinical Impression(s) / ED Diagnoses Final diagnoses:  Fall, initial encounter  Acute pain of right shoulder  Hip pain, right    Rx / DC Orders ED Discharge Orders    None       Margarita Grizzle, MD 10/01/20 1037

## 2020-10-14 DIAGNOSIS — M5416 Radiculopathy, lumbar region: Secondary | ICD-10-CM | POA: Diagnosis not present

## 2020-11-03 DIAGNOSIS — M545 Low back pain, unspecified: Secondary | ICD-10-CM | POA: Diagnosis not present

## 2020-11-05 ENCOUNTER — Emergency Department (HOSPITAL_COMMUNITY)
Admission: EM | Admit: 2020-11-05 | Discharge: 2020-11-05 | Disposition: A | Discharge status: Home / Self Care | Payer: Medicaid Other | Attending: Emergency Medicine | Admitting: Emergency Medicine

## 2020-11-05 ENCOUNTER — Other Ambulatory Visit: Payer: Self-pay

## 2020-11-05 ENCOUNTER — Emergency Department (HOSPITAL_COMMUNITY): Payer: Medicaid Other

## 2020-11-05 DIAGNOSIS — R0789 Other chest pain: Secondary | ICD-10-CM | POA: Insufficient documentation

## 2020-11-05 DIAGNOSIS — R079 Chest pain, unspecified: Secondary | ICD-10-CM | POA: Diagnosis not present

## 2020-11-05 DIAGNOSIS — M79602 Pain in left arm: Secondary | ICD-10-CM | POA: Diagnosis not present

## 2020-11-05 DIAGNOSIS — M545 Low back pain, unspecified: Secondary | ICD-10-CM | POA: Insufficient documentation

## 2020-11-05 DIAGNOSIS — W19XXXA Unspecified fall, initial encounter: Secondary | ICD-10-CM | POA: Insufficient documentation

## 2020-11-05 DIAGNOSIS — Z5321 Procedure and treatment not carried out due to patient leaving prior to being seen by health care provider: Secondary | ICD-10-CM | POA: Insufficient documentation

## 2020-11-05 LAB — I-STAT BETA HCG BLOOD, ED (MC, WL, AP ONLY): I-stat hCG, quantitative: <5 m[IU]/mL (ref <5)

## 2020-11-05 LAB — CBC
HCT: 40.5 % (ref 36.0–46.0)
Hemoglobin: 13.1 g/dL (ref 12.0–15.0)
MCH: 33.2 pg (ref 26.0–34.0)
MCHC: 32.3 g/dL (ref 30.0–36.0)
MCV: 102.5 fL — ABNORMAL HIGH (ref 80.0–100.0)
Platelets: 270 10*3/uL (ref 150–400)
RBC: 3.95 MIL/uL (ref 3.87–5.11)
RDW: 13 % (ref 11.5–15.5)
WBC: 8.9 10*3/uL (ref 4.0–10.5)
nRBC: 0 % (ref 0.0–0.2)

## 2020-11-05 LAB — BASIC METABOLIC PANEL
Anion gap: 9 (ref 5–15)
BUN: 13 mg/dL (ref 6–20)
CO2: 25 mmol/L (ref 22–32)
Calcium: 8.6 mg/dL — ABNORMAL LOW (ref 8.9–10.3)
Chloride: 106 mmol/L (ref 98–111)
Creatinine, Ser: 0.74 mg/dL (ref 0.44–1.00)
GFR, Estimated: >60 mL/min (ref >60)
Glucose, Bld: 92 mg/dL (ref 70–99)
Potassium: 3 mmol/L — ABNORMAL LOW (ref 3.5–5.1)
Sodium: 140 mmol/L (ref 135–145)

## 2020-11-05 LAB — TROPONIN I (HIGH SENSITIVITY): Troponin I (High Sensitivity): <2 ng/L (ref <18)

## 2020-11-05 NOTE — ED Triage Notes (Signed)
Pt reports mechanical fall on Tuesday and now having generalized back pain, L sided pain, L arm pain, and chest pain.

## 2020-11-08 DIAGNOSIS — M47816 Spondylosis without myelopathy or radiculopathy, lumbar region: Secondary | ICD-10-CM | POA: Diagnosis not present

## 2020-11-08 DIAGNOSIS — M545 Low back pain, unspecified: Secondary | ICD-10-CM | POA: Diagnosis not present

## 2020-11-29 DIAGNOSIS — M47816 Spondylosis without myelopathy or radiculopathy, lumbar region: Secondary | ICD-10-CM | POA: Diagnosis not present

## 2020-12-03 ENCOUNTER — Emergency Department (HOSPITAL_COMMUNITY): Payer: Medicaid Other

## 2020-12-03 ENCOUNTER — Emergency Department (HOSPITAL_COMMUNITY)
Admission: EM | Admit: 2020-12-03 | Discharge: 2020-12-03 | Disposition: A | Discharge status: Home / Self Care | Payer: Medicaid Other | Attending: Emergency Medicine | Admitting: Emergency Medicine

## 2020-12-03 ENCOUNTER — Other Ambulatory Visit: Payer: Self-pay

## 2020-12-03 DIAGNOSIS — Z96652 Presence of left artificial knee joint: Secondary | ICD-10-CM | POA: Diagnosis not present

## 2020-12-03 DIAGNOSIS — M47816 Spondylosis without myelopathy or radiculopathy, lumbar region: Secondary | ICD-10-CM | POA: Diagnosis not present

## 2020-12-03 DIAGNOSIS — R531 Weakness: Secondary | ICD-10-CM | POA: Diagnosis present

## 2020-12-03 DIAGNOSIS — I10 Essential (primary) hypertension: Secondary | ICD-10-CM | POA: Insufficient documentation

## 2020-12-03 DIAGNOSIS — Z7901 Long term (current) use of anticoagulants: Secondary | ICD-10-CM | POA: Insufficient documentation

## 2020-12-03 DIAGNOSIS — M792 Neuralgia and neuritis, unspecified: Secondary | ICD-10-CM | POA: Diagnosis not present

## 2020-12-03 DIAGNOSIS — M545 Low back pain, unspecified: Secondary | ICD-10-CM | POA: Diagnosis not present

## 2020-12-03 DIAGNOSIS — M5416 Radiculopathy, lumbar region: Secondary | ICD-10-CM | POA: Diagnosis not present

## 2020-12-03 DIAGNOSIS — R001 Bradycardia, unspecified: Secondary | ICD-10-CM | POA: Insufficient documentation

## 2020-12-03 DIAGNOSIS — M4316 Spondylolisthesis, lumbar region: Secondary | ICD-10-CM | POA: Diagnosis not present

## 2020-12-03 LAB — CBC WITH DIFFERENTIAL/PLATELET
Abs Immature Granulocytes: 0.04 10*3/uL (ref 0.00–0.07)
Basophils Absolute: 0 10*3/uL (ref 0.0–0.1)
Basophils Relative: 0 %
Eosinophils Absolute: 0.1 10*3/uL (ref 0.0–0.5)
Eosinophils Relative: 1 %
HCT: 42.5 % (ref 36.0–46.0)
Hemoglobin: 13.9 g/dL (ref 12.0–15.0)
Immature Granulocytes: 0 %
Lymphocytes Relative: 38 %
Lymphs Abs: 4.7 10*3/uL — ABNORMAL HIGH (ref 0.7–4.0)
MCH: 33.3 pg (ref 26.0–34.0)
MCHC: 32.7 g/dL (ref 30.0–36.0)
MCV: 101.9 fL — ABNORMAL HIGH (ref 80.0–100.0)
Monocytes Absolute: 0.7 10*3/uL (ref 0.1–1.0)
Monocytes Relative: 6 %
Neutro Abs: 6.8 10*3/uL (ref 1.7–7.7)
Neutrophils Relative %: 55 %
Platelets: 322 10*3/uL (ref 150–400)
RBC: 4.17 MIL/uL (ref 3.87–5.11)
RDW: 13.2 % (ref 11.5–15.5)
WBC: 12.3 10*3/uL — ABNORMAL HIGH (ref 4.0–10.5)
nRBC: 0 % (ref 0.0–0.2)

## 2020-12-03 LAB — COMPREHENSIVE METABOLIC PANEL
ALT: 11 U/L (ref 0–44)
AST: 15 U/L (ref 15–41)
Albumin: 3.6 g/dL (ref 3.5–5.0)
Alkaline Phosphatase: 70 U/L (ref 38–126)
Anion gap: 10 (ref 5–15)
BUN: 7 mg/dL (ref 6–20)
CO2: 29 mmol/L (ref 22–32)
Calcium: 8.9 mg/dL (ref 8.9–10.3)
Chloride: 101 mmol/L (ref 98–111)
Creatinine, Ser: 0.71 mg/dL (ref 0.44–1.00)
GFR, Estimated: >60 mL/min (ref >60)
Glucose, Bld: 103 mg/dL — ABNORMAL HIGH (ref 70–99)
Potassium: 3 mmol/L — ABNORMAL LOW (ref 3.5–5.1)
Sodium: 140 mmol/L (ref 135–145)
Total Bilirubin: 0.4 mg/dL (ref 0.3–1.2)
Total Protein: 7.4 g/dL (ref 6.5–8.1)

## 2020-12-03 MED ORDER — OXYCODONE HCL 5 MG PO TABS
10.0000 mg | ORAL_TABLET | Freq: Once | ORAL | Status: AC
  Administered 2020-12-03: 10 mg via ORAL
  Filled 2020-12-03: qty 2

## 2020-12-03 MED ORDER — KETOROLAC TROMETHAMINE 60 MG/2ML IM SOLN
60.0000 mg | Freq: Once | INTRAMUSCULAR | Status: AC
  Administered 2020-12-03: 60 mg via INTRAMUSCULAR

## 2020-12-03 MED ORDER — OXYCODONE HCL 5 MG PO TABS: 10.0000 mg | ORAL_TABLET | Freq: Four times a day (QID) | ORAL | 0 refills | Status: AC | PRN

## 2020-12-03 MED ORDER — KETOROLAC TROMETHAMINE 30 MG/ML IJ SOLN: 30.0000 mg | Freq: Once | INTRAMUSCULAR | Status: DC

## 2020-12-03 MED ORDER — PREDNISONE 20 MG PO TABS: ORAL_TABLET | ORAL | 0 refills | Status: DC

## 2020-12-03 MED ORDER — PREDNISONE 20 MG PO TABS
50.0000 mg | ORAL_TABLET | Freq: Once | ORAL | Status: AC
  Administered 2020-12-03: 50 mg via ORAL
  Filled 2020-12-03: qty 3

## 2020-12-03 NOTE — Discharge Instructions (Signed)
You were evaluated for right back and leg pain that is likely due to nerve irritation following your injections for your back.  We recommend that she take a steroid dose for the next 5 days to help with inflammation and pain.  Please return to care if you begin to experience worsening back pain, numbness in your legs, vomiting, difficulty breathing or passing out.

## 2020-12-03 NOTE — ED Triage Notes (Signed)
Emergency Medicine Provider Triage Evaluation Note  Katie [name] , a 55 y.o. female  was evaluated in triage.  Pt complains of right-sided pain, weakness, and headache.  Patient reports that all of her symptoms started Tuesday night.  Patient reports that she received a corticosteroid injection to her spine on Monday.  Patient reports that she has had this procedure done in the past with no difficulty.  Patient reports that her headache began gradually and has progressively worsened since then.  Patient reports that pain and weakness on right side have worsened since onset.  Patient endorses difficulty walking.  Denies any recent falls or injuries.  Patient takes Xarelto however stopped medication on 11/27/20 due to corticosteroid injection procedure and has not started xarelto since than.    Tested positive for covid 3 weeks prior.    Review of Systems  Positive: Right-sided weakness, right-sided pain, headache Negative: Fever, chills, bowel or bladder dysfunction, saddle anesthesia, shortness of breath, chest pain  Physical Exam  BP 125/78   Pulse (!) 55   Temp 98.6 F (37 C) (Oral)   Resp 14   Ht 5\' 5"  (1.651 m)   Wt 86.2 kg   LMP 12/28/2015 (Exact Date)   SpO2 100%   BMI 31.62 kg/m  Gen:   Awake, no distress, nontoxic appearing HEENT:  Atraumatic, neck supple Resp:  Normal effort Cardiac:  bradycardia Abd:   Nondistended, nontender MSK:   Tenderness to right Paraspinous muscle in thoracic and lumbar back Neuro:  Speech clear, CN II-XII intact, equal grip strength, decreased strength to right lower extremity  Medical Decision Making  Medically screening exam initiated at 3:56 PM.  Appropriate orders placed.  12/30/2015 was informed that the remainder of the evaluation will be completed by another provider, this initial triage assessment does not replace that evaluation, and the importance of remaining in the ED until their evaluation is complete.  Clinical Impression  Concern  for CVA, noncontrast head CT ordered.  CBC and CMP ordered and pending.  Patient will require further assessment.   [name], Haskel Schroeder 12/04/20 [phone]

## 2020-12-03 NOTE — ED Provider Notes (Signed)
[facility] EMERGENCY DEPARTMENT Provider Note   CSN: [id number] Arrival date & time: 12/03/20  1537     History Chief Complaint  Patient presents with  . Extremity Weakness    Katie [name] is a 55 y.o. female.  Patient reports receiving injections in her back 4 days ago.  She denies any pain or weakness just after injections.  She reports having these injections at least 5 times prior to this episode.  Patient noted to have history of severe facet arthropathy bilaterally between L4-L5.  She reports that symptoms were improved initially after injection.  Is noted to have right lower extremity weakness and pain at baseline.  Patient reports history of sciatica and states this pain is different.  She reports that when she awoke 4 days ago after standing from using the restroom, her legs "collapsed" from beneath her.  And she needed help to ambulate around her home.  She denies any recent fevers or chills.  Patient reports that she had Covid about 1 month ago.  She is vaccinated for COVID-19.  She denies any saddle anesthesia or/bowel incontinence.  Patient ambulates independently at baseline.  Denies any dysuria.        Past Medical History:  Diagnosis Date  . Anxiety   . Back pain   . Bipolar 1 disorder (HCC)   . Chronic pain entered 08/15/2015   Treated with Oxycodone  . Depression   . Depression   . Dyspnea   . History of acute bronchitis   . HTN (hypertension)   . OA (osteoarthritis)   . Obesity   . PE (pulmonary embolism)    oct 2014  . Sciatica     Patient Active Problem List   Diagnosis Date Noted  . Primary osteoarthritis of left knee 12/29/2019  . Morbid obesity (HCC)   . Bradycardia   . Pulmonary embolism on long-term anticoagulation therapy (HCC) 04/06/2016  . Pain in the chest   . Chronic pain   . Chest wall pain   . Pulmonary embolism (HCC) 07/31/2013  . Vocal cord dysfunction 02/28/2012  . Dyspnea 01/17/2012  . Chest pain 11/25/2011   . Hypokalemia 11/25/2011  . GERD (gastroesophageal reflux disease) 03/08/2011  . DYSMENORRHEA 11/10/2010  . 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 ROTATOR CUFF SYNDROME, RIGHT 04/11/2010  . VITAMIN D DEFICIENCY 02/18/2010  . Bipolar 1 disorder (HCC) 01/17/2010  . Essential hypertension, benign 01/17/2010  . INSOMNIA 01/17/2010  . OBESITY, NOS 01/03/2007  . Depression 01/03/2007  . HEARING LOSS NOS OR DEAFNESS 01/03/2007  . EDEMA-LEGS,DUE TO VENOUS OBSTRUCT. 01/03/2007  . Irritable bowel syndrome 01/03/2007    Past Surgical History:  Procedure Laterality Date  . ADENOIDECTOMY    . BIOPSY STOMACH    . CARDIAC CATHETERIZATION  2009   normal; in [place]  . CESAREAN SECTION  2002   x 2, 1997 and 2002  . CHOLECYSTECTOMY  2007  . KNEE SURGERY Right 2005  . TONSILLECTOMY    . TOTAL KNEE ARTHROPLASTY Left 01/27/2020   Procedure: TOTAL KNEE ARTHROPLASTY;  Surgeon: [name], MD;  Location: [facility];  Service: Orthopedics;  Laterality: Left;  . TYMPANOSTOMY TUBE PLACEMENT       OB History   No obstetric history on file.     Family History  Problem Relation Age of Onset  . Heart disease Mother   . Diabetes Father   . Asthma Sister  also had rheumatic fever as a child, died age 72    Social History   Tobacco Use  . Smoking status: Never Smoker  . Smokeless tobacco: Never Used  . Tobacco comment: second hand smoke  Vaping Use  . Vaping Use: Never used  Substance Use Topics  . Alcohol use: Yes    Alcohol/week: 0.0 standard drinks    Comment: 2-3 x week has a glass of wine  . Drug use: Yes    Frequency: 1.0 times per week    Types: Marijuana    Home Medications Prior to Admission medications   Medication Sig Start Date End Date Taking? Authorizing Provider  oxyCODONE (ROXICODONE) 5 MG immediate release tablet Take 2 tablets (10 mg total) by mouth every 6 (six) hours as needed for up to 3 days for  severe pain. 12/03/20 12/06/20 Yes Simmons-Robinson, Tawanna Cooler, MD  predniSONE (DELTASONE) 20 MG tablet Take 2 tablets daily for 2 days, take 1 tablet per day for 2 days, take 1/2 tablet per day for 2 days. 12/03/20  Yes Simmons-Robinson, Girl Schissler, MD  cyclobenzaprine (FLEXERIL) 10 MG tablet Take 1 tablet (10 mg total) by mouth 2 (two) times daily as needed for muscle spasms. 10/01/20   Margarita Grizzle, MD  diazepam (VALIUM) 5 MG tablet Take 5 mg by mouth in the morning and at bedtime.  03/02/16   [provider]  docusate sodium (COLACE) 100 MG capsule Take 1 capsule (100 mg total) by mouth 2 (two) times daily. To prevent constipation while taking pain medication. Patient not taking: Reported on 10/01/2020 01/27/20   Albina Billet III, PA-C  furosemide (LASIX) 40 MG tablet Take 1 tablet (40 mg total) by mouth daily. 08/02/13   Zannie Cove, MD  gabapentin (NEURONTIN) 300 MG capsule Take 1 capsule (300 mg total) by mouth 3 (three) times daily as needed for up to 14 days. For 2 weeks post op for pain. Patient not taking: Reported on 10/01/2020 01/27/20 02/10/20  Albina Billet III, PA-C  omeprazole (PRILOSEC) 40 MG capsule Take 40 mg by mouth daily.    [provider]  ondansetron (ZOFRAN) 4 MG tablet Take 1 tablet (4 mg total) by mouth every 8 (eight) hours as needed for nausea or vomiting. Patient not taking: Reported on 10/01/2020 01/27/20   Albina Billet III, PA-C  phentermine (ADIPEX-P) 37.5 MG tablet Take 37.5 mg by mouth every morning.     [provider]  potassium chloride SA (K-DUR,KLOR-CON) 20 MEQ tablet Take 20 mEq by mouth 2 (two) times daily. 05/03/16   [provider]  Rivaroxaban (XARELTO) 20 MG TABS tablet Take 1 tablet (20 mg total) by mouth daily with supper. Starting 10/18, take 20mg  daily Patient taking differently: Take 20 mg by mouth daily with supper.  08/23/13   08/25/13, MD    Allergies    Percocet  [oxycodone-acetaminophen], Tylenol [acetaminophen], and Vicodin [hydrocodone-acetaminophen]  Review of Systems   Review of Systems  Constitutional: Negative for chills, fatigue and fever.  Respiratory: Positive for shortness of breath. Negative for cough.   Cardiovascular: Positive for chest pain.  Gastrointestinal: Positive for diarrhea. Negative for abdominal pain, nausea and vomiting.  Genitourinary: Negative for dysuria and frequency.  Musculoskeletal: Positive for back pain and gait problem. Negative for neck stiffness.       Right leg pain  Skin: Negative for rash and wound.  Neurological: Positive for weakness and headaches. Negative for dizziness, tremors, facial asymmetry and light-headedness.  Physical Exam Updated Vital Signs BP 120/80 (BP Location: Right Arm)   Pulse (!) 55   Temp 98.5 F (36.9 C) (Oral)   Resp 18   Ht 5\' 5"  (1.651 m)   Wt 86.2 kg   LMP 12/28/2015 (Exact Date)   SpO2 100%   BMI 31.62 kg/m   Physical Exam Constitutional:      Appearance: She is not ill-appearing, toxic-appearing or diaphoretic.  Eyes:     Extraocular Movements: Extraocular movements intact.     Conjunctiva/sclera: Conjunctivae normal.  Cardiovascular:     Rate and Rhythm: Regular rhythm. Bradycardia present.     Pulses: Normal pulses.     Heart sounds: Normal heart sounds. No murmur heard.   Pulmonary:     Effort: Pulmonary effort is normal. No respiratory distress.     Breath sounds: Normal breath sounds. No wheezing, rhonchi or rales.  Abdominal:     General: Bowel sounds are normal.     Palpations: Abdomen is soft.     Tenderness: There is no abdominal tenderness.  Musculoskeletal:        General: Tenderness present. No swelling, deformity or signs of injury.     Right lower leg: No edema.     Left lower leg: No edema.  Neurological:     Mental Status: She is alert.     Motor: Weakness present.     Gait: Gait abnormal.     Comments: Patient with tenderness along  L4-L5 distribution in right LE,      ED Results / Procedures / Treatments   Labs (all labs ordered are listed, but only abnormal results are displayed) Labs Reviewed  CBC WITH DIFFERENTIAL/PLATELET - Abnormal; Notable for the following components:      Result Value   WBC 12.3 (*)    MCV 101.9 (*)    Lymphs Abs 4.7 (*)    All other components within normal limits  COMPREHENSIVE METABOLIC PANEL - Abnormal; Notable for the following components:   Potassium 3.0 (*)    Glucose, Bld 103 (*)    All other components within normal limits    EKG None  Radiology CT Head Wo Contrast  Result Date: 12/03/2020 CLINICAL DATA:  right sided weakness that started on Tuesday after a steroid injection on Monday EXAM: CT HEAD WITHOUT CONTRAST TECHNIQUE: Contiguous axial images were obtained from the base of the skull through the vertex without intravenous contrast. COMPARISON:  Head CT 10/01/2021. FINDINGS: Brain: No evidence of acute infarction, hemorrhage, hydrocephalus, extra-axial collection or mass lesion/mass effect. Vascular: No hyperdense vessel or unexpected calcification. Skull: Normal. Negative for fracture or focal lesion. Sinuses/Orbits: No acute finding. Other: None. IMPRESSION: No acute intracranial pathology. Electronically Signed   By: 10/03/2021 MD   On: 12/03/2020 16:40    Procedures Procedures  Medications Ordered in ED Medications  predniSONE (DELTASONE) tablet 50 mg (50 mg Oral Given 12/03/20 1825)  ketorolac (TORADOL) injection 60 mg (60 mg Intramuscular Given 12/03/20 1825)  oxyCODONE (Oxy IR/ROXICODONE) immediate release tablet 10 mg (10 mg Oral Given 12/03/20 1907)    ED Course  I have reviewed the triage vital signs and the nursing notes.  Pertinent labs & imaging results that were available during my care of the patient were reviewed by me and considered in my medical decision making (see chart for details).  Clinical Course as of 12/03/20 2006  Fri Dec 03, 2020   1743 CT head with no acute intracranial abnormalities [MS]    Clinical  Course User Index [MS] Simmons-Robinson, Tawanna Cooler, MD   MDM Rules/Calculators/A&P                          KRISANDRA BUENO is a 55 y.o. female presenting with right sided back and RLE pain and weakness within less than one week of receiving injections for L4-L5 facet arthropathy.  Most likely patient is experiencing neuropathic irritation secondary to steroid injection.  Consider acute cerebrovascular accident so head CT was completed and did not show any acute intracranial abnormalities.  CBC consistent with acute infection.  Metabolic panel within normal limits. Considered cellulitis with possible ascending infection from site of injection however physical exam does not note any erythema or warmth to touch at site of injection on patient's buttocks bilaterally.  Patient is afebrile in ED with temp of 98.5.  Patient saturating 100% on room air.  Blood pressure within normal limits at 120/69. Will plan to treat with steroid taper and pain medication. Patient with decreased pain after dose of oxycodone and start of prednisone taper. Discussed discharge home and recommendation to follow up with PCP.   Final Clinical Impression(s) / ED Diagnoses Final diagnoses:  Nerve pain    Rx / DC Orders ED Discharge Orders         Ordered    predniSONE (DELTASONE) 20 MG tablet        12/03/20 2003    oxyCODONE (ROXICODONE) 5 MG immediate release tablet  Every 6 hours PRN        12/03/20 2003           Ronnald Ramp, MD 12/03/20 2006    Jacalyn Lefevre, MD 12/03/20 2207

## 2020-12-03 NOTE — ED Triage Notes (Signed)
Patient complains of right sided weakness that started on Tuesday after a steroid injection on Monday. States she has had the same injection before with minimal issue.

## 2020-12-06 DIAGNOSIS — M545 Low back pain, unspecified: Secondary | ICD-10-CM | POA: Diagnosis not present

## 2020-12-14 DIAGNOSIS — M47816 Spondylosis without myelopathy or radiculopathy, lumbar region: Secondary | ICD-10-CM | POA: Diagnosis not present

## 2020-12-14 DIAGNOSIS — M545 Low back pain, unspecified: Secondary | ICD-10-CM | POA: Diagnosis not present

## 2020-12-14 DIAGNOSIS — M5412 Radiculopathy, cervical region: Secondary | ICD-10-CM | POA: Diagnosis not present

## 2020-12-14 DIAGNOSIS — M5416 Radiculopathy, lumbar region: Secondary | ICD-10-CM | POA: Diagnosis not present

## 2021-01-03 DIAGNOSIS — Z1152 Encounter for screening for COVID-19: Secondary | ICD-10-CM | POA: Diagnosis not present

## 2021-01-03 DIAGNOSIS — M542 Cervicalgia: Secondary | ICD-10-CM | POA: Diagnosis not present

## 2021-01-06 DIAGNOSIS — M542 Cervicalgia: Secondary | ICD-10-CM | POA: Diagnosis not present

## 2021-01-06 DIAGNOSIS — M5412 Radiculopathy, cervical region: Secondary | ICD-10-CM | POA: Diagnosis not present

## 2021-02-04 DIAGNOSIS — M5412 Radiculopathy, cervical region: Secondary | ICD-10-CM | POA: Diagnosis not present

## 2021-02-07 DIAGNOSIS — Z96642 Presence of left artificial hip joint: Secondary | ICD-10-CM | POA: Diagnosis not present

## 2021-02-08 DIAGNOSIS — Z96642 Presence of left artificial hip joint: Secondary | ICD-10-CM | POA: Diagnosis not present

## 2021-02-09 DIAGNOSIS — Z96642 Presence of left artificial hip joint: Secondary | ICD-10-CM | POA: Diagnosis not present

## 2021-02-10 DIAGNOSIS — Z96642 Presence of left artificial hip joint: Secondary | ICD-10-CM | POA: Diagnosis not present

## 2021-02-11 DIAGNOSIS — Z96642 Presence of left artificial hip joint: Secondary | ICD-10-CM | POA: Diagnosis not present

## 2021-02-12 DIAGNOSIS — Z96642 Presence of left artificial hip joint: Secondary | ICD-10-CM | POA: Diagnosis not present

## 2021-02-13 DIAGNOSIS — Z96642 Presence of left artificial hip joint: Secondary | ICD-10-CM | POA: Diagnosis not present

## 2021-02-14 DIAGNOSIS — Z96642 Presence of left artificial hip joint: Secondary | ICD-10-CM | POA: Diagnosis not present

## 2021-02-15 DIAGNOSIS — Z96642 Presence of left artificial hip joint: Secondary | ICD-10-CM | POA: Diagnosis not present

## 2021-02-16 DIAGNOSIS — Z96642 Presence of left artificial hip joint: Secondary | ICD-10-CM | POA: Diagnosis not present

## 2021-02-17 DIAGNOSIS — Z96642 Presence of left artificial hip joint: Secondary | ICD-10-CM | POA: Diagnosis not present

## 2021-02-18 DIAGNOSIS — Z96642 Presence of left artificial hip joint: Secondary | ICD-10-CM | POA: Diagnosis not present

## 2021-02-19 DIAGNOSIS — Z96642 Presence of left artificial hip joint: Secondary | ICD-10-CM | POA: Diagnosis not present

## 2021-02-20 DIAGNOSIS — Z96642 Presence of left artificial hip joint: Secondary | ICD-10-CM | POA: Diagnosis not present

## 2021-02-21 DIAGNOSIS — Z96642 Presence of left artificial hip joint: Secondary | ICD-10-CM | POA: Diagnosis not present

## 2021-02-22 DIAGNOSIS — Z96642 Presence of left artificial hip joint: Secondary | ICD-10-CM | POA: Diagnosis not present

## 2021-02-23 DIAGNOSIS — Z96642 Presence of left artificial hip joint: Secondary | ICD-10-CM | POA: Diagnosis not present

## 2021-02-24 DIAGNOSIS — Z96642 Presence of left artificial hip joint: Secondary | ICD-10-CM | POA: Diagnosis not present

## 2021-02-25 DIAGNOSIS — Z96642 Presence of left artificial hip joint: Secondary | ICD-10-CM | POA: Diagnosis not present

## 2021-02-26 DIAGNOSIS — Z96642 Presence of left artificial hip joint: Secondary | ICD-10-CM | POA: Diagnosis not present

## 2021-02-27 DIAGNOSIS — Z96642 Presence of left artificial hip joint: Secondary | ICD-10-CM | POA: Diagnosis not present

## 2021-02-28 DIAGNOSIS — Z96642 Presence of left artificial hip joint: Secondary | ICD-10-CM | POA: Diagnosis not present

## 2021-03-01 DIAGNOSIS — Z96642 Presence of left artificial hip joint: Secondary | ICD-10-CM | POA: Diagnosis not present

## 2021-03-02 DIAGNOSIS — Z96642 Presence of left artificial hip joint: Secondary | ICD-10-CM | POA: Diagnosis not present

## 2021-03-03 DIAGNOSIS — Z96642 Presence of left artificial hip joint: Secondary | ICD-10-CM | POA: Diagnosis not present

## 2021-03-03 DIAGNOSIS — M542 Cervicalgia: Secondary | ICD-10-CM | POA: Diagnosis not present

## 2021-03-04 DIAGNOSIS — Z96642 Presence of left artificial hip joint: Secondary | ICD-10-CM | POA: Diagnosis not present

## 2021-03-05 DIAGNOSIS — Z96642 Presence of left artificial hip joint: Secondary | ICD-10-CM | POA: Diagnosis not present

## 2021-03-06 DIAGNOSIS — Z96642 Presence of left artificial hip joint: Secondary | ICD-10-CM | POA: Diagnosis not present

## 2021-03-07 DIAGNOSIS — Z96642 Presence of left artificial hip joint: Secondary | ICD-10-CM | POA: Diagnosis not present

## 2021-03-08 DIAGNOSIS — R0789 Other chest pain: Secondary | ICD-10-CM | POA: Diagnosis not present

## 2021-03-08 DIAGNOSIS — F419 Anxiety disorder, unspecified: Secondary | ICD-10-CM | POA: Diagnosis not present

## 2021-03-08 DIAGNOSIS — Z96642 Presence of left artificial hip joint: Secondary | ICD-10-CM | POA: Diagnosis not present

## 2021-03-08 DIAGNOSIS — M5430 Sciatica, unspecified side: Secondary | ICD-10-CM | POA: Diagnosis not present

## 2021-03-09 DIAGNOSIS — Z96642 Presence of left artificial hip joint: Secondary | ICD-10-CM | POA: Diagnosis not present

## 2021-03-10 DIAGNOSIS — Z96642 Presence of left artificial hip joint: Secondary | ICD-10-CM | POA: Diagnosis not present

## 2021-03-11 DIAGNOSIS — Z96642 Presence of left artificial hip joint: Secondary | ICD-10-CM | POA: Diagnosis not present

## 2021-03-12 DIAGNOSIS — Z96642 Presence of left artificial hip joint: Secondary | ICD-10-CM | POA: Diagnosis not present

## 2021-03-13 DIAGNOSIS — Z96642 Presence of left artificial hip joint: Secondary | ICD-10-CM | POA: Diagnosis not present

## 2021-03-14 DIAGNOSIS — Z96642 Presence of left artificial hip joint: Secondary | ICD-10-CM | POA: Diagnosis not present

## 2021-03-15 DIAGNOSIS — Z96642 Presence of left artificial hip joint: Secondary | ICD-10-CM | POA: Diagnosis not present

## 2021-03-16 DIAGNOSIS — Z96642 Presence of left artificial hip joint: Secondary | ICD-10-CM | POA: Diagnosis not present

## 2021-03-17 DIAGNOSIS — Z96642 Presence of left artificial hip joint: Secondary | ICD-10-CM | POA: Diagnosis not present

## 2021-03-18 DIAGNOSIS — Z96642 Presence of left artificial hip joint: Secondary | ICD-10-CM | POA: Diagnosis not present

## 2021-03-19 DIAGNOSIS — Z96642 Presence of left artificial hip joint: Secondary | ICD-10-CM | POA: Diagnosis not present

## 2021-03-20 DIAGNOSIS — Z96642 Presence of left artificial hip joint: Secondary | ICD-10-CM | POA: Diagnosis not present

## 2021-03-21 DIAGNOSIS — Z96642 Presence of left artificial hip joint: Secondary | ICD-10-CM | POA: Diagnosis not present

## 2021-03-22 DIAGNOSIS — Z96642 Presence of left artificial hip joint: Secondary | ICD-10-CM | POA: Diagnosis not present

## 2021-03-23 DIAGNOSIS — Z96642 Presence of left artificial hip joint: Secondary | ICD-10-CM | POA: Diagnosis not present

## 2021-03-24 DIAGNOSIS — Z96642 Presence of left artificial hip joint: Secondary | ICD-10-CM | POA: Diagnosis not present

## 2021-03-25 DIAGNOSIS — Z96642 Presence of left artificial hip joint: Secondary | ICD-10-CM | POA: Diagnosis not present

## 2021-03-26 DIAGNOSIS — Z96642 Presence of left artificial hip joint: Secondary | ICD-10-CM | POA: Diagnosis not present

## 2021-03-27 DIAGNOSIS — Z96642 Presence of left artificial hip joint: Secondary | ICD-10-CM | POA: Diagnosis not present

## 2021-03-28 DIAGNOSIS — Z96642 Presence of left artificial hip joint: Secondary | ICD-10-CM | POA: Diagnosis not present

## 2021-03-29 ENCOUNTER — Other Ambulatory Visit: Payer: Self-pay | Admitting: Internal Medicine

## 2021-03-29 DIAGNOSIS — Z96642 Presence of left artificial hip joint: Secondary | ICD-10-CM | POA: Diagnosis not present

## 2021-03-29 DIAGNOSIS — R079 Chest pain, unspecified: Secondary | ICD-10-CM

## 2021-03-30 DIAGNOSIS — Z96642 Presence of left artificial hip joint: Secondary | ICD-10-CM | POA: Diagnosis not present

## 2021-03-31 DIAGNOSIS — Z96642 Presence of left artificial hip joint: Secondary | ICD-10-CM | POA: Diagnosis not present

## 2021-04-01 ENCOUNTER — Other Ambulatory Visit: Payer: Self-pay

## 2021-04-01 ENCOUNTER — Ambulatory Visit: Payer: Medicaid Other

## 2021-04-01 DIAGNOSIS — R079 Chest pain, unspecified: Secondary | ICD-10-CM | POA: Diagnosis not present

## 2021-04-01 DIAGNOSIS — Z96642 Presence of left artificial hip joint: Secondary | ICD-10-CM | POA: Diagnosis not present

## 2021-04-02 DIAGNOSIS — Z96642 Presence of left artificial hip joint: Secondary | ICD-10-CM | POA: Diagnosis not present

## 2021-04-03 DIAGNOSIS — Z96642 Presence of left artificial hip joint: Secondary | ICD-10-CM | POA: Diagnosis not present

## 2021-04-04 DIAGNOSIS — Z96642 Presence of left artificial hip joint: Secondary | ICD-10-CM | POA: Diagnosis not present

## 2021-04-05 DIAGNOSIS — Z96642 Presence of left artificial hip joint: Secondary | ICD-10-CM | POA: Diagnosis not present

## 2021-04-06 DIAGNOSIS — Z96642 Presence of left artificial hip joint: Secondary | ICD-10-CM | POA: Diagnosis not present

## 2021-04-07 DIAGNOSIS — Z96642 Presence of left artificial hip joint: Secondary | ICD-10-CM | POA: Diagnosis not present

## 2021-04-08 DIAGNOSIS — Z96642 Presence of left artificial hip joint: Secondary | ICD-10-CM | POA: Diagnosis not present

## 2021-04-09 DIAGNOSIS — Z96642 Presence of left artificial hip joint: Secondary | ICD-10-CM | POA: Diagnosis not present

## 2021-04-10 DIAGNOSIS — Z96642 Presence of left artificial hip joint: Secondary | ICD-10-CM | POA: Diagnosis not present

## 2021-04-11 DIAGNOSIS — Z96642 Presence of left artificial hip joint: Secondary | ICD-10-CM | POA: Diagnosis not present

## 2021-04-12 DIAGNOSIS — Z96642 Presence of left artificial hip joint: Secondary | ICD-10-CM | POA: Diagnosis not present

## 2021-04-13 DIAGNOSIS — Z96642 Presence of left artificial hip joint: Secondary | ICD-10-CM | POA: Diagnosis not present

## 2021-04-14 DIAGNOSIS — Z96642 Presence of left artificial hip joint: Secondary | ICD-10-CM | POA: Diagnosis not present

## 2021-04-15 DIAGNOSIS — Z96642 Presence of left artificial hip joint: Secondary | ICD-10-CM | POA: Diagnosis not present

## 2021-04-16 DIAGNOSIS — Z96642 Presence of left artificial hip joint: Secondary | ICD-10-CM | POA: Diagnosis not present

## 2021-04-17 DIAGNOSIS — Z96642 Presence of left artificial hip joint: Secondary | ICD-10-CM | POA: Diagnosis not present

## 2021-04-18 DIAGNOSIS — Z96642 Presence of left artificial hip joint: Secondary | ICD-10-CM | POA: Diagnosis not present

## 2021-04-19 DIAGNOSIS — Z96642 Presence of left artificial hip joint: Secondary | ICD-10-CM | POA: Diagnosis not present

## 2021-04-20 DIAGNOSIS — Z96642 Presence of left artificial hip joint: Secondary | ICD-10-CM | POA: Diagnosis not present

## 2021-04-21 DIAGNOSIS — Z96642 Presence of left artificial hip joint: Secondary | ICD-10-CM | POA: Diagnosis not present

## 2021-04-21 DIAGNOSIS — M546 Pain in thoracic spine: Secondary | ICD-10-CM | POA: Diagnosis not present

## 2021-04-22 DIAGNOSIS — Z96642 Presence of left artificial hip joint: Secondary | ICD-10-CM | POA: Diagnosis not present

## 2021-04-23 DIAGNOSIS — Z96642 Presence of left artificial hip joint: Secondary | ICD-10-CM | POA: Diagnosis not present

## 2021-04-24 DIAGNOSIS — Z96642 Presence of left artificial hip joint: Secondary | ICD-10-CM | POA: Diagnosis not present

## 2021-04-25 DIAGNOSIS — Z96642 Presence of left artificial hip joint: Secondary | ICD-10-CM | POA: Diagnosis not present

## 2021-04-26 DIAGNOSIS — Z96642 Presence of left artificial hip joint: Secondary | ICD-10-CM | POA: Diagnosis not present

## 2021-04-27 DIAGNOSIS — Z96642 Presence of left artificial hip joint: Secondary | ICD-10-CM | POA: Diagnosis not present

## 2021-04-28 DIAGNOSIS — Z96642 Presence of left artificial hip joint: Secondary | ICD-10-CM | POA: Diagnosis not present

## 2021-04-29 DIAGNOSIS — Z96642 Presence of left artificial hip joint: Secondary | ICD-10-CM | POA: Diagnosis not present

## 2021-04-30 DIAGNOSIS — Z96642 Presence of left artificial hip joint: Secondary | ICD-10-CM | POA: Diagnosis not present

## 2021-05-01 ENCOUNTER — Other Ambulatory Visit: Payer: Self-pay

## 2021-05-01 ENCOUNTER — Encounter (HOSPITAL_COMMUNITY): Payer: Self-pay | Admitting: *Deleted

## 2021-05-01 ENCOUNTER — Emergency Department (HOSPITAL_COMMUNITY): Payer: Medicaid Other

## 2021-05-01 ENCOUNTER — Emergency Department (HOSPITAL_COMMUNITY)
Admission: EM | Admit: 2021-05-01 | Discharge: 2021-05-01 | Disposition: A | Discharge status: Home / Self Care | Payer: Medicaid Other | Attending: Emergency Medicine | Admitting: Emergency Medicine

## 2021-05-01 DIAGNOSIS — R200 Anesthesia of skin: Secondary | ICD-10-CM | POA: Diagnosis not present

## 2021-05-01 DIAGNOSIS — Z96652 Presence of left artificial knee joint: Secondary | ICD-10-CM | POA: Diagnosis not present

## 2021-05-01 DIAGNOSIS — R600 Localized edema: Secondary | ICD-10-CM | POA: Diagnosis not present

## 2021-05-01 DIAGNOSIS — M5134 Other intervertebral disc degeneration, thoracic region: Secondary | ICD-10-CM | POA: Diagnosis not present

## 2021-05-01 DIAGNOSIS — M2578 Osteophyte, vertebrae: Secondary | ICD-10-CM | POA: Diagnosis not present

## 2021-05-01 DIAGNOSIS — I10 Essential (primary) hypertension: Secondary | ICD-10-CM | POA: Diagnosis not present

## 2021-05-01 DIAGNOSIS — M50222 Other cervical disc displacement at C5-C6 level: Secondary | ICD-10-CM | POA: Diagnosis not present

## 2021-05-01 DIAGNOSIS — M546 Pain in thoracic spine: Secondary | ICD-10-CM | POA: Diagnosis not present

## 2021-05-01 DIAGNOSIS — Z8669 Personal history of other diseases of the nervous system and sense organs: Secondary | ICD-10-CM | POA: Diagnosis not present

## 2021-05-01 DIAGNOSIS — Z96642 Presence of left artificial hip joint: Secondary | ICD-10-CM | POA: Diagnosis not present

## 2021-05-01 DIAGNOSIS — R519 Headache, unspecified: Secondary | ICD-10-CM | POA: Diagnosis not present

## 2021-05-01 DIAGNOSIS — R531 Weakness: Secondary | ICD-10-CM | POA: Diagnosis not present

## 2021-05-01 DIAGNOSIS — E041 Nontoxic single thyroid nodule: Secondary | ICD-10-CM | POA: Diagnosis not present

## 2021-05-01 LAB — CBC
HCT: 41.2 % (ref 36.0–46.0)
Hemoglobin: 13.9 g/dL (ref 12.0–15.0)
MCH: 33.5 pg (ref 26.0–34.0)
MCHC: 33.7 g/dL (ref 30.0–36.0)
MCV: 99.3 fL (ref 80.0–100.0)
Platelets: 273 10*3/uL (ref 150–400)
RBC: 4.15 MIL/uL (ref 3.87–5.11)
RDW: 12.8 % (ref 11.5–15.5)
WBC: 6.8 10*3/uL (ref 4.0–10.5)
nRBC: 0 % (ref 0.0–0.2)

## 2021-05-01 LAB — COMPREHENSIVE METABOLIC PANEL
ALT: 10 U/L (ref 0–44)
AST: 15 U/L (ref 15–41)
Albumin: 3.4 g/dL — ABNORMAL LOW (ref 3.5–5.0)
Alkaline Phosphatase: 74 U/L (ref 38–126)
Anion gap: 7 (ref 5–15)
BUN: 9 mg/dL (ref 6–20)
CO2: 28 mmol/L (ref 22–32)
Calcium: 9 mg/dL (ref 8.9–10.3)
Chloride: 102 mmol/L (ref 98–111)
Creatinine, Ser: 0.75 mg/dL (ref 0.44–1.00)
GFR, Estimated: >60 mL/min (ref >60)
Glucose, Bld: 82 mg/dL (ref 70–99)
Potassium: 3.3 mmol/L — ABNORMAL LOW (ref 3.5–5.1)
Sodium: 137 mmol/L (ref 135–145)
Total Bilirubin: 0.8 mg/dL (ref 0.3–1.2)
Total Protein: 6.9 g/dL (ref 6.5–8.1)

## 2021-05-01 MED ORDER — GADOBUTROL 1 MMOL/ML IV SOLN
9.0000 mL | Freq: Once | INTRAVENOUS | Status: AC | PRN
  Administered 2021-05-01: 15:00:00 9 mL via INTRAVENOUS

## 2021-05-01 MED ORDER — GABAPENTIN 300 MG PO CAPS: 600.0000 mg | ORAL_CAPSULE | Freq: Three times a day (TID) | ORAL | 0 refills | Status: DC

## 2021-05-01 MED ORDER — FENTANYL CITRATE (PF) 100 MCG/2ML IJ SOLN
50.0000 ug | Freq: Once | INTRAMUSCULAR | Status: AC
  Administered 2021-05-01: 17:00:00 50 ug via INTRAVENOUS
  Filled 2021-05-01: qty 2

## 2021-05-01 MED ORDER — FENTANYL CITRATE (PF) 100 MCG/2ML IJ SOLN
50.0000 ug | Freq: Once | INTRAMUSCULAR | Status: AC
  Administered 2021-05-01: 14:00:00 50 ug via INTRAVENOUS
  Filled 2021-05-01: qty 2

## 2021-05-01 NOTE — ED Provider Notes (Signed)
[facility] Emergency Department Provider Note MRN:  [id number]  Arrival date & time: 05/01/21     Chief Complaint   Pain   History of Present Illness   Katie [name] is a 55 y.o. year-old female with a history of bipolar disorder presenting to the ED with chief complaint of pain.  Patient endorsing worsening back pain since procedure done in April.  Had injections of her thoracic spine done by orthopedics.  Is feeling progressive pain to the back with weakness and numbness of the right arm and right leg.  Denies bowel or bladder dysfunction.  Denies fever.  No headache or vision change, no chest pain or shortness of breath.  Pain is severe, constant, worse with motion or palpation.  Review of Systems  A complete 10 system review of systems was obtained and all systems are negative except as noted in the HPI and PMH.   Patient's Health History    Past Medical History:  Diagnosis Date   Anxiety    Back pain    Bipolar 1 disorder (HCC)    Chronic pain entered 08/15/2015   Treated with Oxycodone   Depression    Depression    Dyspnea    History of acute bronchitis    HTN (hypertension)    OA (osteoarthritis)    Obesity    PE (pulmonary embolism)    oct 2014   Sciatica     Past Surgical History:  Procedure Laterality Date   ADENOIDECTOMY     BIOPSY STOMACH     CARDIAC CATHETERIZATION  2009   normal; in [place]   CESAREAN SECTION  2002   x 2, 1997 and 2002   CHOLECYSTECTOMY  2007   KNEE SURGERY Right 2005   TONSILLECTOMY     TOTAL KNEE ARTHROPLASTY Left 01/27/2020   Procedure: TOTAL KNEE ARTHROPLASTY;  Surgeon: [name], MD;  Location: [facility];  Service: Orthopedics;  Laterality: Left;   TYMPANOSTOMY TUBE PLACEMENT      Family History  Problem Relation Age of Onset   Heart disease Mother    Diabetes Father    Asthma Sister        also had rheumatic fever as a child, died age 21    Social History   Socioeconomic History    Marital status: Married    Spouse name: Not on file   Number of children: Not on file   Years of education: Not on file   Highest education level: Not on file  Occupational History   Occupation: Housewife  Tobacco Use   Smoking status: Never   Smokeless tobacco: Never   Tobacco comments:    second hand smoke  Vaping Use   Vaping Use: Never used  Substance and Sexual Activity   Alcohol use: Yes    Alcohol/week: 0.0 standard drinks    Comment: 2-3 x week has a glass of wine   Drug use: Yes    Frequency: 1.0 times per week    Types: Marijuana   Sexual activity: Yes  Other Topics Concern   Not on file  Social History Narrative   Lives with husband, daughter and stepson.   Social Determinants of Health   Financial Resource Strain: Not on file  Food Insecurity: Not on file  Transportation Needs: Not on file  Physical Activity: Not on file  Stress: Not on file  Social Connections: Not on file  Intimate Partner Violence: Not on file     Physical  Exam   Vitals:   05/01/21 1330 05/01/21 1345  BP: (!) 136/99 135/83  Pulse: (!) 43 (!) 54  Resp: 12 11  Temp:    SpO2: 100% 94%    CONSTITUTIONAL: Chronically-appearing, NAD NEURO:  Alert and oriented x 3, poor effort, decreased strength on right arm right leg EYES:  eyes equal and reactive ENT/NECK:  no LAD, no JVD CARDIO: Regular rate, well-perfused, normal S1 and S2 PULM:  CTAB no wheezing or rhonchi GI/GU:  normal bowel sounds, non-distended, non-tender MSK/SPINE:  No gross deformities, no edema SKIN:  no rash, atraumatic PSYCH:  Appropriate speech and behavior  *Additional and/or pertinent findings included in MDM below  Diagnostic and Interventional Summary    EKG Interpretation  Date/Time:    Ventricular Rate:    PR Interval:    QRS Duration:   QT Interval:    QTC Calculation:   R Axis:     Text Interpretation:          Labs Reviewed  CBC  COMPREHENSIVE METABOLIC PANEL    MR BRAIN WO CONTRAST     (Results Pending)  MR THORACIC SPINE W WO CONTRAST    (Results Pending)  MR CERVICAL SPINE W WO CONTRAST    (Results Pending)    Medications  fentaNYL (SUBLIMAZE) injection 50 mcg (50 mcg Intravenous Given 05/01/21 1341)     Procedures  /  Critical Care Procedures  ED Course and Medical Decision Making  I have reviewed the triage vital signs, the nursing notes, and pertinent available records from the EMR.  Listed above are laboratory and imaging tests that I personally ordered, reviewed, and interpreted and then considered in my medical decision making (see below for details).  MRI to exclude myelopathy, post procedure hematoma, stroke, epidural abscess.  Weakness may be explained by pain, providing pain control.     Awaiting MRI results, signed out to oncoming provider.  Elmer Sow. Pilar Plate, MD [facility] Emergency Medicine [facility] [email or web address]  Final Clinical Impressions(s) / ED Diagnoses     ICD-10-CM   1. Acute right-sided thoracic back pain  M54.6     2. Weakness  R53.1       ED Discharge Orders     None        Discharge Instructions Discussed with and Provided to Patient:   Discharge Instructions   None       Sabas Sous, MD 05/01/21 1517

## 2021-05-01 NOTE — ED Triage Notes (Signed)
Pt is here for pain on "right side of body".  Pt reports pain on right side of head, neck, back and legs.  Pt reports that she had a spinal injection at [facility] in April due to DDD and pain. Pt reports that she experienced "paralysis" on right side for 3 days in April and pt reports that she has continued to have pain on the right side since. Pt is here today as pain has gotten worse but no trauma or additional injuries.  Pt also reports that she has been having some weakness since April and has fallen x2.  Pt has been evaluated for same symptoms x2 since then.

## 2021-05-01 NOTE — ED Provider Notes (Signed)
4:49 PM Care of the patient assumed at signout.  On this repeat exam after MRI results are available she is resting, awakens easily, is in no distress, with no increased work of breathing. I reviewed MRI findings with her.  We discussed her ongoing home pain medication regimen, and it seems as though a recent prescription for gabapentin was not actually executed, and this will be provided on discharge.   Gerhard Munch, MD 05/01/21 [phone]

## 2021-05-01 NOTE — ED Notes (Signed)
Pt is being transported to MRI.

## 2021-05-01 NOTE — Discharge Instructions (Addendum)
As discussed, your evaluation today has been largely reassuring.  But, it is important that you monitor your condition carefully, and do not hesitate to return to the ED if you develop new, or concerning changes in your condition. ? ?Otherwise, please follow-up with your physician for appropriate ongoing care. ? ?

## 2021-05-02 DIAGNOSIS — Z96642 Presence of left artificial hip joint: Secondary | ICD-10-CM | POA: Diagnosis not present

## 2021-05-03 DIAGNOSIS — Z96642 Presence of left artificial hip joint: Secondary | ICD-10-CM | POA: Diagnosis not present

## 2021-05-04 DIAGNOSIS — Z96642 Presence of left artificial hip joint: Secondary | ICD-10-CM | POA: Diagnosis not present

## 2021-05-05 DIAGNOSIS — Z96642 Presence of left artificial hip joint: Secondary | ICD-10-CM | POA: Diagnosis not present

## 2021-05-06 DIAGNOSIS — Z96642 Presence of left artificial hip joint: Secondary | ICD-10-CM | POA: Diagnosis not present

## 2021-05-07 DIAGNOSIS — Z96642 Presence of left artificial hip joint: Secondary | ICD-10-CM | POA: Diagnosis not present

## 2021-05-08 DIAGNOSIS — Z96642 Presence of left artificial hip joint: Secondary | ICD-10-CM | POA: Diagnosis not present

## 2021-05-09 DIAGNOSIS — Z96642 Presence of left artificial hip joint: Secondary | ICD-10-CM | POA: Diagnosis not present

## 2021-05-10 DIAGNOSIS — Z96642 Presence of left artificial hip joint: Secondary | ICD-10-CM | POA: Diagnosis not present

## 2021-05-11 DIAGNOSIS — Z96642 Presence of left artificial hip joint: Secondary | ICD-10-CM | POA: Diagnosis not present

## 2021-05-12 DIAGNOSIS — Z96642 Presence of left artificial hip joint: Secondary | ICD-10-CM | POA: Diagnosis not present

## 2021-05-13 DIAGNOSIS — Z96642 Presence of left artificial hip joint: Secondary | ICD-10-CM | POA: Diagnosis not present

## 2021-05-14 DIAGNOSIS — Z96642 Presence of left artificial hip joint: Secondary | ICD-10-CM | POA: Diagnosis not present

## 2021-05-15 DIAGNOSIS — Z96642 Presence of left artificial hip joint: Secondary | ICD-10-CM | POA: Diagnosis not present

## 2021-05-16 DIAGNOSIS — Z96642 Presence of left artificial hip joint: Secondary | ICD-10-CM | POA: Diagnosis not present

## 2021-05-17 DIAGNOSIS — Z96642 Presence of left artificial hip joint: Secondary | ICD-10-CM | POA: Diagnosis not present

## 2021-05-18 DIAGNOSIS — Z96642 Presence of left artificial hip joint: Secondary | ICD-10-CM | POA: Diagnosis not present

## 2021-05-19 DIAGNOSIS — Z96642 Presence of left artificial hip joint: Secondary | ICD-10-CM | POA: Diagnosis not present

## 2021-05-20 DIAGNOSIS — Z96642 Presence of left artificial hip joint: Secondary | ICD-10-CM | POA: Diagnosis not present

## 2021-05-21 DIAGNOSIS — Z96642 Presence of left artificial hip joint: Secondary | ICD-10-CM | POA: Diagnosis not present

## 2021-05-22 DIAGNOSIS — Z96642 Presence of left artificial hip joint: Secondary | ICD-10-CM | POA: Diagnosis not present

## 2021-05-23 DIAGNOSIS — Z96642 Presence of left artificial hip joint: Secondary | ICD-10-CM | POA: Diagnosis not present

## 2021-05-24 DIAGNOSIS — Z96642 Presence of left artificial hip joint: Secondary | ICD-10-CM | POA: Diagnosis not present

## 2021-05-25 DIAGNOSIS — G894 Chronic pain syndrome: Secondary | ICD-10-CM | POA: Diagnosis not present

## 2021-05-25 DIAGNOSIS — Z96642 Presence of left artificial hip joint: Secondary | ICD-10-CM | POA: Diagnosis not present

## 2021-05-25 DIAGNOSIS — M5430 Sciatica, unspecified side: Secondary | ICD-10-CM | POA: Diagnosis not present

## 2021-05-25 DIAGNOSIS — F419 Anxiety disorder, unspecified: Secondary | ICD-10-CM | POA: Diagnosis not present

## 2021-05-26 DIAGNOSIS — Z96642 Presence of left artificial hip joint: Secondary | ICD-10-CM | POA: Diagnosis not present

## 2021-05-27 DIAGNOSIS — Z96642 Presence of left artificial hip joint: Secondary | ICD-10-CM | POA: Diagnosis not present

## 2021-05-28 DIAGNOSIS — Z96642 Presence of left artificial hip joint: Secondary | ICD-10-CM | POA: Diagnosis not present

## 2021-05-29 DIAGNOSIS — Z96642 Presence of left artificial hip joint: Secondary | ICD-10-CM | POA: Diagnosis not present

## 2021-05-30 DIAGNOSIS — Z96642 Presence of left artificial hip joint: Secondary | ICD-10-CM | POA: Diagnosis not present

## 2021-05-31 DIAGNOSIS — Z96642 Presence of left artificial hip joint: Secondary | ICD-10-CM | POA: Diagnosis not present

## 2021-06-01 DIAGNOSIS — Z96642 Presence of left artificial hip joint: Secondary | ICD-10-CM | POA: Diagnosis not present

## 2021-06-02 DIAGNOSIS — Z96642 Presence of left artificial hip joint: Secondary | ICD-10-CM | POA: Diagnosis not present

## 2021-06-03 DIAGNOSIS — Z96642 Presence of left artificial hip joint: Secondary | ICD-10-CM | POA: Diagnosis not present

## 2021-06-04 DIAGNOSIS — Z96642 Presence of left artificial hip joint: Secondary | ICD-10-CM | POA: Diagnosis not present

## 2021-06-05 DIAGNOSIS — Z96642 Presence of left artificial hip joint: Secondary | ICD-10-CM | POA: Diagnosis not present

## 2021-06-06 DIAGNOSIS — Z96642 Presence of left artificial hip joint: Secondary | ICD-10-CM | POA: Diagnosis not present

## 2021-06-07 DIAGNOSIS — Z96642 Presence of left artificial hip joint: Secondary | ICD-10-CM | POA: Diagnosis not present

## 2021-06-08 DIAGNOSIS — Z96642 Presence of left artificial hip joint: Secondary | ICD-10-CM | POA: Diagnosis not present

## 2021-06-09 DIAGNOSIS — Z96642 Presence of left artificial hip joint: Secondary | ICD-10-CM | POA: Diagnosis not present

## 2021-06-10 DIAGNOSIS — Z96642 Presence of left artificial hip joint: Secondary | ICD-10-CM | POA: Diagnosis not present

## 2021-06-11 DIAGNOSIS — Z96642 Presence of left artificial hip joint: Secondary | ICD-10-CM | POA: Diagnosis not present

## 2021-06-12 DIAGNOSIS — Z96642 Presence of left artificial hip joint: Secondary | ICD-10-CM | POA: Diagnosis not present

## 2021-06-13 DIAGNOSIS — Z96642 Presence of left artificial hip joint: Secondary | ICD-10-CM | POA: Diagnosis not present

## 2021-06-14 DIAGNOSIS — Z96642 Presence of left artificial hip joint: Secondary | ICD-10-CM | POA: Diagnosis not present

## 2021-06-14 DIAGNOSIS — M5412 Radiculopathy, cervical region: Secondary | ICD-10-CM | POA: Diagnosis not present

## 2021-06-15 DIAGNOSIS — Z96642 Presence of left artificial hip joint: Secondary | ICD-10-CM | POA: Diagnosis not present

## 2021-06-16 DIAGNOSIS — Z96642 Presence of left artificial hip joint: Secondary | ICD-10-CM | POA: Diagnosis not present

## 2021-06-17 DIAGNOSIS — Z96642 Presence of left artificial hip joint: Secondary | ICD-10-CM | POA: Diagnosis not present

## 2021-06-18 DIAGNOSIS — Z96642 Presence of left artificial hip joint: Secondary | ICD-10-CM | POA: Diagnosis not present

## 2021-06-19 DIAGNOSIS — Z96642 Presence of left artificial hip joint: Secondary | ICD-10-CM | POA: Diagnosis not present

## 2021-06-20 DIAGNOSIS — Z96642 Presence of left artificial hip joint: Secondary | ICD-10-CM | POA: Diagnosis not present

## 2021-06-21 DIAGNOSIS — Z96642 Presence of left artificial hip joint: Secondary | ICD-10-CM | POA: Diagnosis not present

## 2021-06-22 DIAGNOSIS — Z96642 Presence of left artificial hip joint: Secondary | ICD-10-CM | POA: Diagnosis not present

## 2021-06-23 DIAGNOSIS — Z96642 Presence of left artificial hip joint: Secondary | ICD-10-CM | POA: Diagnosis not present

## 2021-06-23 DIAGNOSIS — Z20822 Contact with and (suspected) exposure to covid-19: Secondary | ICD-10-CM | POA: Diagnosis not present

## 2021-06-24 DIAGNOSIS — Z96642 Presence of left artificial hip joint: Secondary | ICD-10-CM | POA: Diagnosis not present

## 2021-06-25 DIAGNOSIS — Z96642 Presence of left artificial hip joint: Secondary | ICD-10-CM | POA: Diagnosis not present

## 2021-06-26 DIAGNOSIS — Z96642 Presence of left artificial hip joint: Secondary | ICD-10-CM | POA: Diagnosis not present

## 2021-06-27 DIAGNOSIS — G5602 Carpal tunnel syndrome, left upper limb: Secondary | ICD-10-CM | POA: Diagnosis not present

## 2021-06-27 DIAGNOSIS — M4722 Other spondylosis with radiculopathy, cervical region: Secondary | ICD-10-CM | POA: Diagnosis not present

## 2021-06-27 DIAGNOSIS — Z6836 Body mass index (BMI) 36.0-36.9, adult: Secondary | ICD-10-CM | POA: Diagnosis not present

## 2021-06-27 DIAGNOSIS — G5601 Carpal tunnel syndrome, right upper limb: Secondary | ICD-10-CM | POA: Diagnosis not present

## 2021-06-27 DIAGNOSIS — Z96642 Presence of left artificial hip joint: Secondary | ICD-10-CM | POA: Diagnosis not present

## 2021-06-28 DIAGNOSIS — Z96642 Presence of left artificial hip joint: Secondary | ICD-10-CM | POA: Diagnosis not present

## 2021-06-29 DIAGNOSIS — Z96642 Presence of left artificial hip joint: Secondary | ICD-10-CM | POA: Diagnosis not present

## 2021-06-30 DIAGNOSIS — Z96642 Presence of left artificial hip joint: Secondary | ICD-10-CM | POA: Diagnosis not present

## 2021-07-01 DIAGNOSIS — Z96642 Presence of left artificial hip joint: Secondary | ICD-10-CM | POA: Diagnosis not present

## 2021-07-02 DIAGNOSIS — Z96642 Presence of left artificial hip joint: Secondary | ICD-10-CM | POA: Diagnosis not present

## 2021-07-03 DIAGNOSIS — Z96642 Presence of left artificial hip joint: Secondary | ICD-10-CM | POA: Diagnosis not present

## 2021-07-04 DIAGNOSIS — Z96642 Presence of left artificial hip joint: Secondary | ICD-10-CM | POA: Diagnosis not present

## 2021-07-05 DIAGNOSIS — Z96642 Presence of left artificial hip joint: Secondary | ICD-10-CM | POA: Diagnosis not present

## 2021-07-06 DIAGNOSIS — Z96642 Presence of left artificial hip joint: Secondary | ICD-10-CM | POA: Diagnosis not present

## 2021-07-07 DIAGNOSIS — Z96642 Presence of left artificial hip joint: Secondary | ICD-10-CM | POA: Diagnosis not present

## 2021-07-08 DIAGNOSIS — Z96642 Presence of left artificial hip joint: Secondary | ICD-10-CM | POA: Diagnosis not present

## 2021-07-09 DIAGNOSIS — Z96642 Presence of left artificial hip joint: Secondary | ICD-10-CM | POA: Diagnosis not present

## 2021-07-10 DIAGNOSIS — Z96642 Presence of left artificial hip joint: Secondary | ICD-10-CM | POA: Diagnosis not present

## 2021-07-11 DIAGNOSIS — Z96642 Presence of left artificial hip joint: Secondary | ICD-10-CM | POA: Diagnosis not present

## 2021-07-12 ENCOUNTER — Other Ambulatory Visit: Payer: Self-pay

## 2021-07-12 ENCOUNTER — Ambulatory Visit: Payer: Medicaid Other | Attending: Neurosurgery | Admitting: Physical Therapy

## 2021-07-12 DIAGNOSIS — R296 Repeated falls: Secondary | ICD-10-CM | POA: Diagnosis not present

## 2021-07-12 DIAGNOSIS — R252 Cramp and spasm: Secondary | ICD-10-CM | POA: Insufficient documentation

## 2021-07-12 DIAGNOSIS — R2681 Unsteadiness on feet: Secondary | ICD-10-CM | POA: Insufficient documentation

## 2021-07-12 DIAGNOSIS — M6281 Muscle weakness (generalized): Secondary | ICD-10-CM | POA: Diagnosis not present

## 2021-07-12 DIAGNOSIS — M542 Cervicalgia: Secondary | ICD-10-CM | POA: Diagnosis not present

## 2021-07-12 DIAGNOSIS — Z96642 Presence of left artificial hip joint: Secondary | ICD-10-CM | POA: Diagnosis not present

## 2021-07-12 NOTE — Therapy (Signed)
[facility] Outpatient Rehabilitation [facility] [address] [place], [place], [address] Phone: [phone]   Fax:  [phone]  Physical Therapy Evaluation  Patient Details  Name: Katie [name] MRN: [id number] Date of Birth: August 08, 1966 Referring Provider (PT): Coletta Memos MD   Encounter Date: 07/12/2021   PT End of Session - 07/12/21 1204     Visit Number 1    Number of Visits 16    Date for PT Re-Evaluation 09/06/21    Authorization Type healthy Blue  no TX, esitm , vaso or ionto    PT Start Time 1147    PT Stop Time 1230    PT Time Calculation (min) 43 min    Activity Tolerance Patient limited by pain    Behavior During Therapy [facility] for tasks assessed/performed             Past Medical History:  Diagnosis Date   Anxiety    Back pain    Bipolar 1 disorder (HCC)    Chronic pain entered 08/15/2015   Treated with Oxycodone   Depression    Depression    Dyspnea    History of acute bronchitis    HTN (hypertension)    OA (osteoarthritis)    Obesity    PE (pulmonary embolism)    oct 2014   Sciatica     Past Surgical History:  Procedure Laterality Date   ADENOIDECTOMY     BIOPSY STOMACH     CARDIAC CATHETERIZATION  2009   normal; in [place]   CESAREAN SECTION  2002   x 2, 1997 and 2002   CHOLECYSTECTOMY  2007   KNEE SURGERY Right 2005   TONSILLECTOMY     TOTAL KNEE ARTHROPLASTY Left 01/27/2020   Procedure: TOTAL KNEE ARTHROPLASTY;  Surgeon: [name], MD;  Location: [facility];  Service: Orthopedics;  Laterality: Left;   TYMPANOSTOMY TUBE PLACEMENT      There were no vitals filed for this visit.    Subjective Assessment - 07/12/21 1153     Subjective I have been having neck and back pain since Jan  2022 and I had injection  by Dr Maurice Small in my thoracic mid back in Jan and one in April. the next day after my injection  I felt like I was paralyzed and my daughter had to take me back and forth to the bathroom. I also had pain in my  chest.  Dr Maurice Small did a stress test and My heart was normal.   Now I am with Dr Franky Macho for my neck pain. I feel like I have no balance and My hands feel weak on both hands  R > L.  Pt has a home health aide to do laundry.  She tries to cook.  sometimes I also have a shortness of breath.  My exercises is mostly at the pool 4-5 times a week.  Pt feels her balance is off    Pertinent History L TKA 01-26-21  DDD cardiac cath 2009  , Sciatica, RTC syndrome, plantar fasciitis, carpal tunnel.  OA, obesity,  See medical History, ALLERGIC to Percocet, Hydrocodone,Vicodin, Aleve    How long can you sit comfortably? 3-5 min R mid back to low starts hurting.  radiating in my shld    How long can you stand comfortably? 5 minutes    How long can you walk comfortably? my balance is off and I cant stand up too long    Diagnostic tests MRIAt C4-C5, posterior disc  osteophyte complex contacts and mildly  flattens the ventral cord without significant canal stenosis. Mild  left foraminal stenosis at this level. Findings are similar to the  prior.  2. At C5-C6, new small central disc protrusion contacts the ventral  cord without cord deformation or significant canal stenosis.  3. Normal cord signal without enhancement.THORACIC. Since the 2017 prior there is new mild (10-20%) height loss of  the T1 and T3 vertebral bodies, suggestive of mild compression  fractures. There is mild marrow edema along the right T3 superior  endplate and bilateral T3 pedicles. It is unclear if this represents  recent or unhealed fractures or degenerative/stress related change.  Recommend correlation with the presence or absence of point  tenderness and any history of recent trauma.  2. Degenerative disc height loss at T9-T10, similar to prior. No  evidence of significant canal stenosis or foraminal impingement.  3. Normal cord signal without enhancement.    Patient Stated Goals Pt would like to be able to walk without fear of falling.  and not holding  onto walls,  would like to shop for groceries without dependence on cart.  play with son on floor.  do household chores without excruciating pain.  and shop forgroceries without having to ride on motorized cart or hanging onto shopping cart    Currently in Pain? Yes    Pain Score 8     Pain Location Neck    Pain Orientation Left;Right    Pain Descriptors / Indicators Aching;Sharp;Shooting    Pain Type Chronic pain    Pain Radiating Towards starts at my shoulders and it shoots to my neck and then down my back.    Pain Onset More than a month ago    Aggravating Factors  household chores such as sweeping, hard coming back up to stand.    My home health aid does my laundry    Pain Relieving Factors I try to go to the pool 4-5 times a week    Multiple Pain Sites Yes    Pain Score 8    Pain Location Head    Pain Orientation Other (Comment);Right;Left   whole head   Pain Descriptors / Indicators Throbbing;Pressure    Pain Onset More than a month ago    Pain Frequency Constant                [facility] PT Assessment - 07/12/21 0001       Assessment   Medical Diagnosis cervical spondylosis with radiculopathy    Referring Provider (PT) Coletta Memos MD    Onset Date/Surgical Date 11/20/20   cervical and back pain since jan 2022   Hand Dominance Right    Prior Therapy for TKA Left      Precautions   Precautions None      Restrictions   Weight Bearing Restrictions No      Balance Screen   Has the patient fallen in the past 6 months Yes    How many times? 3   walking down hall mid afternoon   Has the patient had a decrease in activity level because of a fear of falling?  Yes    Is the patient reluctant to leave their home because of a fear of falling?  No      Home Environment   Living Environment Private residence    Living Arrangements Spouse/significant other;Children    Type of Home Mobile home    Home Access Stairs to enter  Entrance Stairs-Number of Steps 5    Entrance  Stairs-Rails Can reach both    [employer] - single point;Walker - 2 wheels      Prior Function   Level of Independence Independent      Cognition   Overall Cognitive Status Within Functional Limits for tasks assessed      Observation/Other Assessments   Focus on Therapeutic Outcomes (FOTO)  MCD NA      Functional Tests   Functional tests Sit to Stand;Floor to Stand      Single Leg Stance   Comments R 3 sec L 4 sec      Sit to Stand   Comments 5 x STS 68.9 sec      Floor to Stand   Comments Pt cannot transfers from floor to stand      Posture/Postural Control   Posture/Postural Control Postural limitations    Postural Limitations Rounded Shoulders;Forward head    Posture Comments pt has had +90 lb wt loss in last 3 years intentional wt loss      AROM   Overall AROM  Deficits    Overall AROM Comments shoulders WFL moves slowly    Cervical Flexion 42    Cervical Extension 50    Cervical - Right Side Bend 29   painful   Cervical - Left Side Bend 37    Cervical - Right Rotation 32   tingling down R shld/back to   Cervical - Left Rotation 35    Lumbar Flexion 50% limited reaching just below knee    Lumbar Extension 50 % limited    Lumbar - Right Side Bend 50% finger tip to knee joint line    Lumbar - Left Side Bend 50% finger tip to knee jt kine    Lumbar - Right Rotation 75% limited    Lumbar - Left Rotation 75% limited      Strength   Overall Strength Deficits    Right Shoulder Extension 4/5    Right Shoulder ABduction 4/5    Left Shoulder Extension 4/5    Left Shoulder ABduction 4/5    Right Hand Grip (lbs) 43.6   46,43,42   Left Hand Grip (lbs) 59.3   60.60 58   Right Hip Flexion 3+/5    Right Hip Extension 3-/5    Right Hip ABduction 3-/5    Left Hip Flexion 4-/5    Left Hip Extension 4-/5    Left Hip ABduction 3+/5    Right Knee Flexion 4/5    Right Knee Extension 4/5    Left Knee Flexion 4+/5    Left Knee Extension 5/5    Right Ankle  Dorsiflexion 4-/5    Right Ankle Plantar Flexion 4/5    Left Ankle Dorsiflexion 4+/5    Left Ankle Plantar Flexion 4+/5      Palpation   Palpation comment TTP over R scalens and post cervicals thoracic paraspinals R T-2 to T 6,  R QL and R buttock tenderness      Ambulation/Gait   Ambulation/Gait Yes    Assistive device None    Gait Pattern Antalgic;Lateral trunk lean to left;Decreased trunk rotation;Decreased stance time - right;Decreased stride length    Ambulation Surface Level    Gait velocity 2.07 ft /sec    Gait Comments wide base gait and holding onto walls. cruising walls  Objective measurements completed on examination: See above findings.                 PT Short Term Goals - 07/12/21 1247       PT SHORT TERM GOAL #1   Title Independent with initial HEP    Time 3    Period Weeks    Status New    Target Date 08/02/21      PT SHORT TERM GOAL #2   Title Pt will be able to perform 6 minute walk test.    Baseline unable due to weakness and decreased endurance  Can only tolerate about 2 minutes    Time 3    Period Weeks    Status New    Target Date 08/02/21      PT SHORT TERM GOAL #3   Title Pt will be able to perform sit to stand with proper form without using momentum and compensations    Baseline 5 x STS 68.9 sec with compensations    Time 3    Period Weeks    Status New    Target Date 08/02/21      PT SHORT TERM GOAL #4   Title Pt will be offered opportunity for aquatic therapy to advance current program she is using on her own    Baseline Pt benefits from aquatic water exercises 4-5 x a week currently but apparently not increasing her strength    Time 3    Period Weeks    Status New    Target Date 08/02/21               PT Long Term Goals - 07/12/21 1250       PT LONG TERM GOAL #1   Title Pt will be independent with advanced HEP given    Baseline pt with generalized weakness    Time 7     Period Weeks    Status New    Target Date 09/06/21      PT LONG TERM GOAL #2   Title Pt will be able to negotiate steps without exacerbation of pain greater than 2/10  and LRAD    Baseline one step at a time with 7/10  pain on steps R side mid back to thigh    Time 8    Period Weeks    Status New    Target Date 09/06/21      PT LONG TERM GOAL #3   Title Pt will increase neck AROM 10 degreees bil to increase abilty to turn neck for ambulation and visual scanning    Baseline AROM R rot 39, L rot 35    Time 8    Period Weeks    Status New    Target Date 09/06/21      PT LONG TERM GOAL #4   Title Pt will be able to perform standing to floor transfer in order to play with kids and to ensure safety in home    Baseline Pt is fearful that she would fall on floor and not get back up.  5 X STS is 68.9 sec    Time 8    Period Weeks    Status New    Target Date 09/06/21      PT LONG TERM GOAL #5   Title PT with be able to walk/stand >/= 30-45 min with no AD with </= 2/10 pain for functional endurance and return to shopping/leisure activities    Baseline Must shop  holding onto shopping cart and/or riding in a shopping motorized cart.  one year ago for TKA was able to walk without AD    Time 8    Period Weeks    Status New    Target Date 09/06/21      PT LONG TERM GOAL #6   Title Pt will incorporate regular land based exercise in order to maintain strength for ADL/household tasks    Baseline Pt unable to do any regular land based exercise    Time 8    Period Weeks    Status New    Target Date 09/06/21                    Plan - 07/12/21 1307     Clinical Impression Statement Ms Kushnir returns to clinic ( former L TKA pt) with CC of R sided pain from cervical/shoulder , thoracic and low back side pain.  Pt to ED and being sent to MRI for myelopathy symptoms and MRI reveals possible mild compression fx of T-1 to T-3 and mild C-5/6  disc protrusion.  Mild foraminal stenoiss at  C-4/5.   Pt does state she is concerned about balance and falls and has fallen 3 times in past 6 months while walking down hall in mid day.  Pt endurance is poor and she is has much decreased stamina and strength since last visit.  Ms Deangelo goes to swimming pool 4-5 times a week but was educated on importance of building strength for activities on land.  Pt presents with R Hand  ( dominnant hand ) weaker than left.  Pt is unsteady on feet and expresses fear of falling.  Pt would benefit from skilled PT to address weakness, pain, decreased cervical and lumbar AROM and increase ablity to rise from standing. Pt is a fall risk based on 5 xSTS  and LE weakness and would benefit from fall preparendness training.    Personal Factors and Comorbidities Comorbidity 1;Comorbidity 2    Comorbidities L TKA 01-26-21  DDD cardiac cath 2009  , Sciatica, RTC syndrome, plantar fasciitis, carpal tunnel.  OA, obesity,  See medical History, ALLERGIC to Percocet, Hydrocodone,Vicodin, Aleve    Examination-Activity Limitations Squat;Stand;Stairs;Bend;Carry;Lift;Locomotion Level;Reach Overhead;Transfers    Examination-Participation Restrictions Meal Prep    Stability/Clinical Decision Making Evolving/Moderate complexity    Clinical Decision Making Moderate    Rehab Potential Good    PT Frequency 2x / week    PT Duration 8 weeks    PT Treatment/Interventions ADLs/Self Care Home Management;Aquatic Therapy;Cryotherapy;Moist Heat;Balance training;Therapeutic exercise;Therapeutic activities;Functional mobility training;Stair training;Gait training;Neuromuscular re-education;Patient/family education;Passive range of motion;Manual techniques;Dry needling;Taping;Joint Manipulations;Vestibular    PT Next Visit Plan assess balance/ BERG/DGI  low back and basic neck exercise    PT Home Exercise Plan [email or web address]             Patient will benefit from skilled therapeutic intervention in order to improve the following deficits and  impairments:  Pain, Obesity, Postural dysfunction, Improper body mechanics, Difficulty walking, Decreased mobility, Decreased endurance, Decreased range of motion, Decreased strength, Hypomobility, Increased muscle spasms, Impaired flexibility, Impaired UE functional use, Decreased activity tolerance, Decreased balance  Visit Diagnosis: Cervicalgia  Muscle weakness (generalized)  Unsteadiness on feet  Cramp and spasm  Repeated falls  Access Code: [email or web address]: [email or web address]: 09/06/2022Prepared by: Wayland Denis BeardsleyExercises  Heel Raises with Counter Support - 1 x daily - 7 x weekly - 3 sets - 10 reps  Sit to Stand with Counter Support - 1 x  daily - 7 x weekly - 3 sets - 10 reps   Problem List Patient Active Problem List   Diagnosis Date Noted   Primary osteoarthritis of left knee 12/29/2019   Morbid obesity (HCC)    Bradycardia    Pulmonary embolism on long-term anticoagulation therapy (HCC) 04/06/2016   Pain in the chest    Chronic pain    Chest wall pain    Pulmonary embolism (HCC) 07/31/2013   Vocal cord dysfunction 02/28/2012   Dyspnea 01/17/2012   Chest pain 11/25/2011   Hypokalemia 11/25/2011   GERD (gastroesophageal reflux disease) 03/08/2011   DYSMENORRHEA 11/10/2010   PLANTAR FASCIITIS 09/14/2010   OSTEOARTHRITIS, KNEES, BILATERAL, 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 01/03/2007   Irritable bowel syndrome 01/03/2007   Garen Lah, PT, ATRIC Certified Exercise Expert for the Aging Adult  07/12/21 2:57 PM Phone: [phone] Fax: [phone]   [facility] Out[facility] [address] [place], [place],  [address] Phone: [phone]   Fax:  [phone]  Name: BREELLA VANOSTRAND MRN: [id number] Date of Birth: Nov 14, 1965  Check all possible CPT codes: 97110- Therapeutic Exercise, [phone]- Neuro Re-education, [phone] - Gait Training, [phone] - Manual Therapy, [phone] - Therapeutic Activities, [phone] - Self Care, [phone] - Physical performance training, and [id number] - Aquatic therapy

## 2021-07-12 NOTE — Patient Instructions (Addendum)
Fall Prevention and Home Safety Falls cause injuries and can affect all age groups. It is possible to use preventive measures to significantly decrease the likelihood of falls. There are many simple measures which can make your home safer and prevent falls. OUTDOORS Repair cracks and edges of walkways and driveways. Remove high doorway thresholds. Trim shrubbery on the main path into your home. Have good outside lighting. Clear walkways of tools, rocks, debris, and clutter. Check that handrails are not broken and are securely fastened. Both sides of steps should have handrails. Have leaves, snow, and ice cleared regularly. Use sand or salt on walkways during winter months. In the garage, clean up grease or oil spills. BATHROOM Install night lights. Install grab bars by the toilet and in the tub and shower. Use non-skid mats or decals in the tub or shower. Place a plastic non-slip stool in the shower to sit on, if needed. Keep floors dry and clean up all water on the floor immediately. Remove soap buildup in the tub or shower on a regular basis. Secure bath mats with non-slip, double-sided rug tape. Remove throw rugs and tripping hazards from the floors. BEDROOMS Install night lights. Make sure a bedside light is easy to reach. Do not use oversized bedding. Keep a telephone by your bedside. Have a firm chair with side arms to use for getting dressed. Remove throw rugs and tripping hazards from the floor. KITCHEN Keep handles on pots and pans turned toward the center of the stove. Use back burners when possible. Clean up spills quickly and allow time for drying. Avoid walking on wet floors. Avoid hot utensils and knives. Position shelves so they are not too high or low. Place commonly used objects within easy reach. If necessary, use a sturdy step stool with a grab bar when reaching. Keep electrical cables out of the way. Do not use floor polish or wax that makes floors slippery.  If you must use wax, use non-skid floor wax. Remove throw rugs and tripping hazards from the floor. STAIRWAYS Never leave objects on stairs. Place handrails on both sides of stairways and use them. Fix any loose handrails. Make sure handrails on both sides of the stairways are as long as the stairs. Check carpeting to make sure it is firmly attached along stairs. Make repairs to worn or loose carpet promptly. Avoid placing throw rugs at the top or bottom of stairways, or properly secure the rug with carpet tape to prevent slippage. Get rid of throw rugs, if possible. Have an electrician put in a light switch at the top and bottom of the stairs. OTHER FALL PREVENTION TIPS Wear low-heel or rubber-soled shoes that are supportive and fit well. Wear closed toe shoes. When using a stepladder, make sure it is fully opened and both spreaders are firmly locked. Do not climb a closed stepladder. Add color or contrast paint or tape to grab bars and handrails in your home. Place contrasting color strips on first and last steps. Learn and use mobility aids as needed. Install an electrical emergency response system. Turn on lights to avoid dark areas. Replace light bulbs that burn out immediately. Get light switches that glow. Arrange furniture to create clear pathways. Keep furniture in the same place. Firmly attach carpet with non-skid or double-sided tape. Eliminate uneven floor surfaces. Select a carpet pattern that does not visually hide the edge of steps. Be aware of all pets. OTHER HOME SAFETY TIPS Set the water temperature for 120 F (48.8 C). Keep  emergency numbers on or near the telephone. Keep smoke detectors on every level of the home and near sleeping areas. Document Released: 10/13/2002 Document Revised: 04/23/2012 Document Reviewed: 01/12/2012 [facility] Patient Information 2015 [place], [place]. This information is not intended to replace advice given to you by your health care provider. Make  sure you discuss any questions you have with your health care provider.   Aquatic Therapy at [facility]-  What to Expect!  Where:   [facility] [address] [place], [place] [address] Rehab phone [phone]  NOTE:  You will receive an automated phone message reminding you of your appt and it will say the appointment is at the [facility] clinic.          How to Prepare: Please make sure you drink 8 ounces of water about one hour prior to your pool session A caregiver may attend if needed with the patient to help assist as needed. A caregiver can sit in the pool room on chair. Please arrive IN YOUR SUIT and 15 minutes prior to your appointment - this helps to avoid delays in starting your session. Please make sure to attend to any toileting needs prior to entering the pool [place] rooms for changing are provided.   There is direct access to the pool deck form the locker room.  You can lock your belongings in a locker with lock provided. Once on the pool deck your therapist will ask if you have signed the Patient  Consent and Assignment of Benefits form before beginning treatment Your therapist may take your blood pressure prior to, during and after your session if indicated We usually try and create a home exercise program based on activities we do in the pool.  Please be thinking about who might be able to assist you in the pool should you need to participate in an aquatic home exercise program at the time of discharge if you need assistance.  Some patients do not want to or do not have the ability to participate in an aquatic home program - this is not a barrier in any way to you participating in aquatic therapy as part of your current therapy plan! After Discharge from PT, you can continue using home program at  the [facility], there is a drop-in fee for $5 ($45 a month)or for 60 years  or older $4.00 ($40 a  month for seniors ) or any local [employer] pool.  Memberships for purchase are available for gym/pool at [facility]  IT IS VERY IMPORTANT THAT YOUR LAST VISIT BE IN THE CLINIC AT [facility] STREET AFTER YOUR LAST AQUATIC VISIT.  PLEASE MAKE SURE THAT YOU HAVE A [address]  APPOINTMENT SCHEDULED.   About the pool: Pool is located approximately 500 FT from the entrance of the building.  Please bring a support person if you need assistance traveling this      distance.   Your therapist will assist you in entering the water; there are two ways to           enter: stairs with railings, and a mechanical lift. Your therapist will determine the most appropriate way for you.  Water temperature is usually between 88-90 degrees  There may be up to 2 other swimmers in the pool at the same time  The pool deck is tile, please wear shoes with good traction if you prefer not to be barefoot.    Contact Info:  For appointment scheduling  and cancellations:         Please call the [facility]  PH:[phone]              Aquatic Therapy  Outpatient Rehabilitation @ [facility]       All sessions are 45 minutes                                                     Garen Lah, PT, [facility] Certified Exercise Expert for the Aging Adult  07/12/21 12:40 PM Phone: [phone] Fax: [phone]

## 2021-07-13 DIAGNOSIS — Z96642 Presence of left artificial hip joint: Secondary | ICD-10-CM | POA: Diagnosis not present

## 2021-07-14 DIAGNOSIS — Z96642 Presence of left artificial hip joint: Secondary | ICD-10-CM | POA: Diagnosis not present

## 2021-07-15 DIAGNOSIS — Z96642 Presence of left artificial hip joint: Secondary | ICD-10-CM | POA: Diagnosis not present

## 2021-07-16 DIAGNOSIS — Z96642 Presence of left artificial hip joint: Secondary | ICD-10-CM | POA: Diagnosis not present

## 2021-07-17 DIAGNOSIS — Z96642 Presence of left artificial hip joint: Secondary | ICD-10-CM | POA: Diagnosis not present

## 2021-07-18 DIAGNOSIS — Z96642 Presence of left artificial hip joint: Secondary | ICD-10-CM | POA: Diagnosis not present

## 2021-07-19 DIAGNOSIS — Z96642 Presence of left artificial hip joint: Secondary | ICD-10-CM | POA: Diagnosis not present

## 2021-07-20 DIAGNOSIS — Z96642 Presence of left artificial hip joint: Secondary | ICD-10-CM | POA: Diagnosis not present

## 2021-07-21 DIAGNOSIS — Z96642 Presence of left artificial hip joint: Secondary | ICD-10-CM | POA: Diagnosis not present

## 2021-07-22 DIAGNOSIS — Z96642 Presence of left artificial hip joint: Secondary | ICD-10-CM | POA: Diagnosis not present

## 2021-07-23 DIAGNOSIS — Z96642 Presence of left artificial hip joint: Secondary | ICD-10-CM | POA: Diagnosis not present

## 2021-07-24 DIAGNOSIS — Z96642 Presence of left artificial hip joint: Secondary | ICD-10-CM | POA: Diagnosis not present

## 2021-07-25 ENCOUNTER — Ambulatory Visit: Payer: Medicaid Other

## 2021-07-25 DIAGNOSIS — Z96642 Presence of left artificial hip joint: Secondary | ICD-10-CM | POA: Diagnosis not present

## 2021-07-26 ENCOUNTER — Encounter (HOSPITAL_COMMUNITY): Payer: Self-pay | Admitting: Emergency Medicine

## 2021-07-26 ENCOUNTER — Other Ambulatory Visit: Payer: Self-pay

## 2021-07-26 ENCOUNTER — Emergency Department (HOSPITAL_COMMUNITY): Payer: Medicaid Other

## 2021-07-26 ENCOUNTER — Observation Stay (HOSPITAL_COMMUNITY)
Admission: EM | Admit: 2021-07-26 | Discharge: 2021-07-27 | Disposition: A | Discharge status: Home / Self Care | Payer: Medicaid Other | Attending: Internal Medicine | Admitting: Internal Medicine

## 2021-07-26 DIAGNOSIS — Z79899 Other long term (current) drug therapy: Secondary | ICD-10-CM | POA: Insufficient documentation

## 2021-07-26 DIAGNOSIS — T782XXA Anaphylactic shock, unspecified, initial encounter: Secondary | ICD-10-CM | POA: Diagnosis not present

## 2021-07-26 DIAGNOSIS — Z20822 Contact with and (suspected) exposure to covid-19: Secondary | ICD-10-CM | POA: Diagnosis not present

## 2021-07-26 DIAGNOSIS — T7840XA Allergy, unspecified, initial encounter: Secondary | ICD-10-CM | POA: Diagnosis present

## 2021-07-26 DIAGNOSIS — I10 Essential (primary) hypertension: Secondary | ICD-10-CM | POA: Diagnosis not present

## 2021-07-26 DIAGNOSIS — R0789 Other chest pain: Secondary | ICD-10-CM | POA: Diagnosis not present

## 2021-07-26 DIAGNOSIS — T508X5A Adverse effect of diagnostic agents, initial encounter: Secondary | ICD-10-CM | POA: Diagnosis not present

## 2021-07-26 DIAGNOSIS — Z96652 Presence of left artificial knee joint: Secondary | ICD-10-CM | POA: Insufficient documentation

## 2021-07-26 DIAGNOSIS — R600 Localized edema: Secondary | ICD-10-CM | POA: Diagnosis not present

## 2021-07-26 DIAGNOSIS — Z7982 Long term (current) use of aspirin: Secondary | ICD-10-CM | POA: Insufficient documentation

## 2021-07-26 DIAGNOSIS — M47816 Spondylosis without myelopathy or radiculopathy, lumbar region: Secondary | ICD-10-CM | POA: Diagnosis not present

## 2021-07-26 DIAGNOSIS — Z96642 Presence of left artificial hip joint: Secondary | ICD-10-CM | POA: Diagnosis not present

## 2021-07-26 DIAGNOSIS — Z7901 Long term (current) use of anticoagulants: Secondary | ICD-10-CM | POA: Diagnosis not present

## 2021-07-26 DIAGNOSIS — R079 Chest pain, unspecified: Secondary | ICD-10-CM | POA: Diagnosis not present

## 2021-07-26 DIAGNOSIS — R0602 Shortness of breath: Secondary | ICD-10-CM | POA: Diagnosis not present

## 2021-07-26 DIAGNOSIS — Z9049 Acquired absence of other specified parts of digestive tract: Secondary | ICD-10-CM | POA: Diagnosis not present

## 2021-07-26 LAB — COMPREHENSIVE METABOLIC PANEL
ALT: 9 U/L (ref 0–44)
AST: 13 U/L — ABNORMAL LOW (ref 15–41)
Albumin: 3.7 g/dL (ref 3.5–5.0)
Alkaline Phosphatase: 94 U/L (ref 38–126)
Anion gap: 6 (ref 5–15)
BUN: 13 mg/dL (ref 6–20)
CO2: 30 mmol/L (ref 22–32)
Calcium: 9.3 mg/dL (ref 8.9–10.3)
Chloride: 103 mmol/L (ref 98–111)
Creatinine, Ser: 0.77 mg/dL (ref 0.44–1.00)
GFR, Estimated: >60 mL/min (ref >60)
Glucose, Bld: 92 mg/dL (ref 70–99)
Potassium: 3.5 mmol/L (ref 3.5–5.1)
Sodium: 139 mmol/L (ref 135–145)
Total Bilirubin: 0.6 mg/dL (ref 0.3–1.2)
Total Protein: 7.9 g/dL (ref 6.5–8.1)

## 2021-07-26 LAB — CBC WITH DIFFERENTIAL/PLATELET
Abs Immature Granulocytes: 0.02 10*3/uL (ref 0.00–0.07)
Basophils Absolute: 0.1 10*3/uL (ref 0.0–0.1)
Basophils Relative: 1 %
Eosinophils Absolute: 0.1 10*3/uL (ref 0.0–0.5)
Eosinophils Relative: 1 %
HCT: 41.6 % (ref 36.0–46.0)
Hemoglobin: 13.7 g/dL (ref 12.0–15.0)
Immature Granulocytes: 0 %
Lymphocytes Relative: 50 %
Lymphs Abs: 3.8 10*3/uL (ref 0.7–4.0)
MCH: 32.4 pg (ref 26.0–34.0)
MCHC: 32.9 g/dL (ref 30.0–36.0)
MCV: 98.3 fL (ref 80.0–100.0)
Monocytes Absolute: 0.5 10*3/uL (ref 0.1–1.0)
Monocytes Relative: 6 %
Neutro Abs: 3.2 10*3/uL (ref 1.7–7.7)
Neutrophils Relative %: 42 %
Platelets: 300 10*3/uL (ref 150–400)
RBC: 4.23 MIL/uL (ref 3.87–5.11)
RDW: 12.8 % (ref 11.5–15.5)
WBC: 7.5 10*3/uL (ref 4.0–10.5)
nRBC: 0 % (ref 0.0–0.2)

## 2021-07-26 LAB — TROPONIN I (HIGH SENSITIVITY)
Troponin I (High Sensitivity): 2 ng/L (ref <18)
Troponin I (High Sensitivity): 3 ng/L (ref <18)

## 2021-07-26 LAB — RESP PANEL BY RT-PCR (FLU A&B, COVID) ARPGX2
Influenza A by PCR: NEGATIVE
Influenza B by PCR: NEGATIVE
SARS Coronavirus 2 by RT PCR: NEGATIVE

## 2021-07-26 MED ORDER — MELOXICAM 15 MG PO TABS
15.0000 mg | ORAL_TABLET | Freq: Every day | ORAL | Status: DC
  Administered 2021-07-27: 15 mg via ORAL
  Filled 2021-07-26: qty 1

## 2021-07-26 MED ORDER — ALBUTEROL SULFATE (2.5 MG/3ML) 0.083% IN NEBU
2.5000 mg | INHALATION_SOLUTION | Freq: Once | RESPIRATORY_TRACT | Status: AC
  Administered 2021-07-26: 2.5 mg via RESPIRATORY_TRACT
  Filled 2021-07-26: qty 3

## 2021-07-26 MED ORDER — DIPHENHYDRAMINE HCL 50 MG/ML IJ SOLN
25.0000 mg | Freq: Once | INTRAMUSCULAR | Status: AC
  Administered 2021-07-26: 25 mg via INTRAVENOUS
  Filled 2021-07-26: qty 1

## 2021-07-26 MED ORDER — LACTATED RINGERS IV BOLUS
1000.0000 mL | Freq: Once | INTRAVENOUS | Status: AC
  Administered 2021-07-26: 1000 mL via INTRAVENOUS

## 2021-07-26 MED ORDER — POTASSIUM CHLORIDE CRYS ER 20 MEQ PO TBCR
20.0000 meq | EXTENDED_RELEASE_TABLET | Freq: Two times a day (BID) | ORAL | Status: DC
  Administered 2021-07-26 – 2021-07-27 (×2): 20 meq via ORAL
  Filled 2021-07-26 (×2): qty 1

## 2021-07-26 MED ORDER — EPINEPHRINE 0.3 MG/0.3ML IJ SOAJ
INTRAMUSCULAR | Status: AC
  Administered 2021-07-26: 0.3 mg
  Filled 2021-07-26: qty 0.3

## 2021-07-26 MED ORDER — DIAZEPAM 5 MG PO TABS
10.0000 mg | ORAL_TABLET | Freq: Two times a day (BID) | ORAL | Status: DC
  Administered 2021-07-26 – 2021-07-27 (×2): 10 mg via ORAL
  Filled 2021-07-26 (×2): qty 2

## 2021-07-26 MED ORDER — METHYLPREDNISOLONE SODIUM SUCC 40 MG IJ SOLR
40.0000 mg | Freq: Every day | INTRAMUSCULAR | Status: DC
  Administered 2021-07-26 – 2021-07-27 (×2): 40 mg via INTRAVENOUS
  Filled 2021-07-26 (×2): qty 1

## 2021-07-26 MED ORDER — RIVAROXABAN 20 MG PO TABS
20.0000 mg | ORAL_TABLET | Freq: Every day | ORAL | Status: DC
  Administered 2021-07-27: 20 mg via ORAL
  Filled 2021-07-26: qty 1

## 2021-07-26 MED ORDER — FAMOTIDINE IN NACL 20-0.9 MG/50ML-% IV SOLN
20.0000 mg | Freq: Two times a day (BID) | INTRAVENOUS | Status: AC
Start: 2021-07-26 — End: 2021-07-27
  Administered 2021-07-26 – 2021-07-27 (×2): 20 mg via INTRAVENOUS
  Filled 2021-07-26 (×2): qty 50

## 2021-07-26 MED ORDER — ONDANSETRON HCL 4 MG/2ML IJ SOLN: 4.0000 mg | Freq: Four times a day (QID) | INTRAMUSCULAR | Status: DC | PRN

## 2021-07-26 MED ORDER — GABAPENTIN 300 MG PO CAPS: 600.0000 mg | ORAL_CAPSULE | Freq: Every day | ORAL | Status: DC

## 2021-07-26 MED ORDER — FUROSEMIDE 40 MG PO TABS
40.0000 mg | ORAL_TABLET | Freq: Every day | ORAL | Status: DC
  Administered 2021-07-27: 40 mg via ORAL
  Filled 2021-07-26: qty 1

## 2021-07-26 MED ORDER — IOHEXOL 350 MG/ML SOLN
100.0000 mL | Freq: Once | INTRAVENOUS | Status: AC | PRN
  Administered 2021-07-26: 100 mL via INTRAVENOUS

## 2021-07-26 MED ORDER — GABAPENTIN 300 MG PO CAPS
600.0000 mg | ORAL_CAPSULE | Freq: Every day | ORAL | Status: DC
  Administered 2021-07-26 – 2021-07-27 (×2): 600 mg via ORAL
  Filled 2021-07-26 (×2): qty 2

## 2021-07-26 MED ORDER — FENTANYL CITRATE PF 50 MCG/ML IJ SOSY
12.5000 ug | PREFILLED_SYRINGE | Freq: Once | INTRAMUSCULAR | Status: AC
  Administered 2021-07-26: 12.5 ug via INTRAVENOUS
  Filled 2021-07-26: qty 1

## 2021-07-26 MED ORDER — DIPHENHYDRAMINE HCL 12.5 MG/5ML PO ELIX
25.0000 mg | ORAL_SOLUTION | Freq: Three times a day (TID) | ORAL | Status: DC
Start: 2021-07-26 — End: 2021-07-27
  Administered 2021-07-26 – 2021-07-27 (×2): 25 mg via ORAL
  Filled 2021-07-26 (×2): qty 10

## 2021-07-26 MED ORDER — ONDANSETRON HCL 4 MG/2ML IJ SOLN
4.0000 mg | Freq: Once | INTRAMUSCULAR | Status: AC
  Administered 2021-07-26: 4 mg via INTRAVENOUS
  Filled 2021-07-26: qty 2

## 2021-07-26 MED ORDER — FAMOTIDINE IN NACL 20-0.9 MG/50ML-% IV SOLN
20.0000 mg | Freq: Once | INTRAVENOUS | Status: AC
  Administered 2021-07-26: 20 mg via INTRAVENOUS
  Filled 2021-07-26: qty 50

## 2021-07-26 MED ORDER — METHYLPREDNISOLONE SODIUM SUCC 125 MG IJ SOLR
125.0000 mg | Freq: Once | INTRAMUSCULAR | Status: AC
  Administered 2021-07-26: 125 mg via INTRAVENOUS
  Filled 2021-07-26: qty 2

## 2021-07-26 NOTE — ED Notes (Signed)
3rd verbal order for EPI pen given by Durwin Nora, MD

## 2021-07-26 NOTE — ED Notes (Signed)
O2 Levels 85% room air. Pt placed on 2L [place]. O2 levels 100% with good waveform.

## 2021-07-26 NOTE — ED Triage Notes (Signed)
Pt states that she started having L sided chest pain that radiates to her back on Sunday. Alert and oriented.

## 2021-07-26 NOTE — H&P (Signed)
History and Physical    [name]:[id number] DOB: 1965/12/01 DOA: 07/26/2021  PCP: Rometta Emery, MD  Patient coming from: Home  Chief Complaint: chest pain  HPI: Katie [name] is a 55 y.o. female with medical history significant of HTN, chronic pain, anxiety, HTN. Presenting with chest pain. Reports that it started 2 days ago. It was left side radiating to the neck and arm. It's a constant throbbing and sharp pain. She didn't have any other symptoms. She didn't try anything for her pain except rest. She became concerned when her symptoms did not abate this morning. She denies any other aggravating or alleviating factors.    ED Course: EKG was negative. Trp was negative x 2. She was sent for a CTA chest/ab/pelvis. It was negative for acute lesion. Shortly after testing, she had tightness in her chest, dyspnea, nausea, and throat/lip swelling. She was given epi x 3, solumedrol, benadryl and pepcid. She improved. [facility] was called for admission.   Review of Systems: Review of systems is otherwise negative for all not mentioned in HPI.   PMHx Past Medical History:  Diagnosis Date   Anxiety    Back pain    Bipolar 1 disorder (HCC)    Chronic pain entered 08/15/2015   Treated with Oxycodone   Depression    Depression    Dyspnea    History of acute bronchitis    HTN (hypertension)    OA (osteoarthritis)    Obesity    PE (pulmonary embolism)    oct 2014   Sciatica     PSHx Past Surgical History:  Procedure Laterality Date   ADENOIDECTOMY     BIOPSY STOMACH     CARDIAC CATHETERIZATION  2009   normal; in [place]   CESAREAN SECTION  2002   x 2, 1997 and 2002   CHOLECYSTECTOMY  2007   KNEE SURGERY Right 2005   TONSILLECTOMY     TOTAL KNEE ARTHROPLASTY Left 01/27/2020   Procedure: TOTAL KNEE ARTHROPLASTY;  Surgeon: [name], MD;  Location: [facility];  Service: Orthopedics;  Laterality: Left;   TYMPANOSTOMY TUBE PLACEMENT      SocHx  reports that she has never  smoked. She has never used smokeless tobacco. She reports current alcohol use. She reports current drug use. Frequency: 1.00 time per week. Drug: Marijuana.  Allergies  Allergen Reactions   Contrast Media [Iodinated Diagnostic Agents] Anaphylaxis   Percocet [Oxycodone-Acetaminophen] Hives and Other (See Comments)    From the Tylenol, she can take oxycodone by itself   Toradol [Ketorolac Tromethamine] Other (See Comments)    Not effective   Tylenol [Acetaminophen] Hives and Swelling   Vicodin [Hydrocodone-Acetaminophen] Hives and Swelling    FamHx Family History  Problem Relation Age of Onset   Heart disease Mother    Diabetes Father    Asthma Sister        also had rheumatic fever as a child, died age 44    Prior to Admission medications   Medication Sig Start Date End Date Taking? Authorizing Provider  aspirin 81 MG chewable tablet Chew 81 mg by mouth daily as needed for mild pain.   Yes [provider]  diazepam (VALIUM) 10 MG tablet Take 10 mg by mouth 2 (two) times daily. 06/25/21  Yes [provider]  furosemide (LASIX) 40 MG tablet Take 1 tablet (40 mg total) by mouth daily. 08/02/13  Yes Zannie Cove, MD  gabapentin (NEURONTIN) 300 MG capsule Take 2 capsules (  600 mg total) by mouth 3 (three) times daily. Patient taking differently: Take 600 mg by mouth daily. 05/01/21 07/26/21 Yes Gerhard Munch, MD  meloxicam (MOBIC) 15 MG tablet Take 15 mg by mouth daily. 04/05/21  Yes [provider]  omeprazole (PRILOSEC) 40 MG capsule Take 40 mg by mouth daily.   Yes [provider]  phentermine (ADIPEX-P) 37.5 MG tablet Take 37.5 mg by mouth every morning.    Yes [provider]  potassium chloride SA (K-DUR,KLOR-CON) 20 MEQ tablet Take 20 mEq by mouth 2 (two) times daily. 05/03/16  Yes [provider]  Rivaroxaban (XARELTO) 20 MG TABS tablet Take 1 tablet (20 mg total) by mouth daily with supper. Starting 10/18, take 20mg   daily Patient taking differently: Take 20 mg by mouth daily with supper. 08/23/13  Yes 08/25/13, MD  cyclobenzaprine (FLEXERIL) 10 MG tablet Take 1 tablet (10 mg total) by mouth 2 (two) times daily as needed for muscle spasms. Patient not taking: No sig reported 10/01/20   10/03/20, MD    Physical Exam: Vitals:   07/26/21 1430 07/26/21 1445 07/26/21 1500 07/26/21 1515  BP: 127/79 140/85 135/74 137/88  Pulse: 73 65 68 74  Resp: (!) 38 (!) 24 16 18   Temp:      TempSrc:      SpO2: 98% 91% 100% 99%  Weight:      Height:        General: 55 y.o. female resting in bed in NAD Eyes: PERRL, normal sclera ENMT: Nares patent w/o discharge, orophaynx clear, dentition normal, ears w/o discharge/lesions/ulcers; swelling of lips noted Neck: Supple, trachea midline Cardiovascular: RRR, +S1, S2, no m/g/r, equal pulses throughout Respiratory: CTABL, no w/r/r, normal WOB GI: BS+, NDNT, no masses noted, no organomegaly noted MSK: No e/c/c Skin: No rashes, bruises, ulcerations noted Neuro: A&O x 3, no focal deficits Psyc: Appropriate interaction and affect, calm/cooperative  Labs on Admission: I have personally reviewed following labs and imaging studies  CBC: Recent Labs  Lab 07/26/21 1149  WBC 7.5  NEUTROABS 3.2  HGB 13.7  HCT 41.6  MCV 98.3  PLT 300   Basic Metabolic Panel: Recent Labs  Lab 07/26/21 1149  NA 139  K 3.5  CL 103  CO2 30  GLUCOSE 92  BUN 13  CREATININE 0.77  CALCIUM 9.3   GFR: Estimated Creatinine Clearance: 97.4 mL/min (by C-G formula based on SCr of 0.77 mg/dL). Liver Function Tests: Recent Labs  Lab 07/26/21 1149  AST 13*  ALT 9  ALKPHOS 94  BILITOT 0.6  PROT 7.9  ALBUMIN 3.7   No results for input(s): LIPASE, AMYLASE in the last 168 hours. No results for input(s): AMMONIA in the last 168 hours. Coagulation Profile: No results for input(s): INR, PROTIME in the last 168 hours. Cardiac Enzymes: No results for input(s): CKTOTAL, CKMB,  CKMBINDEX, TROPONINI in the last 168 hours. BNP (last 3 results) No results for input(s): PROBNP in the last 8760 hours. HbA1C: No results for input(s): HGBA1C in the last 72 hours. CBG: No results for input(s): GLUCAP in the last 168 hours. Lipid Profile: No results for input(s): CHOL, HDL, LDLCALC, TRIG, CHOLHDL, LDLDIRECT in the last 72 hours. Thyroid Function Tests: No results for input(s): TSH, T4TOTAL, FREET4, T3FREE, THYROIDAB in the last 72 hours. Anemia Panel: No results for input(s): VITAMINB12, FOLATE, FERRITIN, TIBC, IRON, RETICCTPCT in the last 72 hours. Urine analysis:    Component Value Date/Time   COLORURINE STRAW (A) 01/19/2020 1423  APPEARANCEUR CLEAR 01/19/2020 1423   LABSPEC 1.004 (L) 01/19/2020 1423   PHURINE 7.0 01/19/2020 1423   GLUCOSEU NEGATIVE 01/19/2020 1423   HGBUR NEGATIVE 01/19/2020 1423   BILIRUBINUR NEGATIVE 01/19/2020 1423   KETONESUR NEGATIVE 01/19/2020 1423   PROTEINUR NEGATIVE 01/19/2020 1423   UROBILINOGEN 0.2 12/03/2013 2345   NITRITE NEGATIVE 01/19/2020 1423   LEUKOCYTESUR NEGATIVE 01/19/2020 1423    Radiological Exams on Admission: DG Chest 2 View  Result Date: 07/26/2021 CLINICAL DATA:  Chest pain EXAM: CHEST - 2 VIEW COMPARISON:  Chest radiograph 11/05/2020 FINDINGS: The cardiomediastinal silhouette is stable. There is no focal consolidation or pulmonary edema. There is no pleural effusion or pneumothorax. There is no acute osseous abnormality. IMPRESSION: No radiographic evidence of acute cardiopulmonary process. Electronically Signed   By: Lesia Hausen M.D.   On: 07/26/2021 12:39   CT Angio Chest/Abd/Pel for Dissection W and/or W/WO  Result Date: 07/26/2021 CLINICAL DATA:  Chest pain or back pain, aortic dissection suspected EXAM: CT ANGIOGRAPHY CHEST, ABDOMEN AND PELVIS TECHNIQUE: Non-contrast CT of the chest was initially obtained. Multidetector CT imaging through the chest, abdomen and pelvis was performed using the standard  protocol during bolus administration of intravenous contrast. Multiplanar reconstructed images and MIPs were obtained and reviewed to evaluate the vascular anatomy. CONTRAST:  OMNIPAQUE IOHEXOL 350 MG/ML SOLN COMPARISON:  None. FINDINGS: CTA CHEST FINDINGS Cardiovascular: Thoracic aorta is normal in caliber. No evidence of intramural hematoma or aortic dissection. Heart size is normal. No pericardial effusion. Mediastinum/Nodes: No enlarged lymph nodes. Lungs/Pleura: No consolidation or mass. No pleural effusion or pneumothorax. Musculoskeletal: No acute abnormality. Review of the MIP images confirms the above findings. CTA ABDOMEN AND PELVIS FINDINGS VASCULAR Aorta: Normal caliber.  No evidence of dissection. Celiac: Patent without stenosis. SMA: Patent without stenosis. Renals: Patent without stenosis. IMA: Patent without stenosis. Inflow: Patent. Veins: Poorly evaluated. Review of the MIP images confirms the above findings. NON-VASCULAR Hepatobiliary: No focal liver abnormality is seen. Status post cholecystectomy. No biliary dilatation. Pancreas: Unremarkable. Spleen: Unremarkable. Adrenals/Urinary Tract: Unremarkable. Stomach/Bowel: Stomach within normal limits. Bowel is normal in caliber. Normal appendix. Lymphatic: No enlarged lymph nodes. Reproductive: Uterus and bilateral adnexa are unremarkable. Other: No free fluid.  Abdominal wall is unremarkable. Musculoskeletal: Lumbar spine degenerative changes. Review of the MIP images confirms the above findings. IMPRESSION: No evidence of aortic dissection or other acute abnormality. Electronically Signed   By: Guadlupe Spanish M.D.   On: 07/26/2021 14:27    EKG: Independently reviewed. Sinus, no st elevation  Assessment/Plan Chest pain     - place in obs     - EKG ok, trp neg x 2     - CTA is negative     - rxn to contrast allergy, see below  Anaphylaxis     - believed to contrast dye; mark as new allergy     - got epi x 3, solumedrol, benadryl,  pepcid     - still some lip swelling, but airway is improved, she is feeling better     - continue steroids, H1/H2 blockers ON  Hx of PE on xarelto     - continue xarelto  Chronic pain     - continue home regimen  Anxiety     - continue home regimen  HTN     - continue home regimen  DVT prophylaxis: xarelto Code Status: FULL  Family Communication: None at bedside  Consults called: None   Status is: Observation  The patient remains OBS  appropriate and will d/c before 2 midnights.  Dispo: The patient is from: Home              Anticipated d/c is to: Home              Patient currently is not medically stable to d/c.   Difficult to place patient No  Time spent coordinating admission: 45 minutes  Keval Nam A Kaiyu Mirabal DO [facility]  If 7PM-7AM, please contact night-coverage [email or web address]  07/26/2021, 3:55 PM

## 2021-07-26 NOTE — ED Notes (Signed)
Pt brought over by CT and rapid response team. Pt experienced tightness in chest after having CT scan done. Verbal order for EPI pen given by Durwin Nora, MD. Pt able to speak and verbalize symptoms. Pt vitals WDL, but still c/o chest tightness and pain.

## 2021-07-26 NOTE — ED Notes (Signed)
Pt.c/o chest pain after CT scan, Dr. Lowella Dandy and Rapid Response was called to evaluate patient.

## 2021-07-26 NOTE — ED Provider Notes (Signed)
[facility]-EMERGENCY DEPT Provider Note   CSN: [id number] Arrival date & time: 07/26/21  1112     History Chief Complaint  Patient presents with   Chest Pain    Katie [name] is a 55 y.o. female.   Chest Pain Associated symptoms: back pain, dysphagia, nausea and shortness of breath   Associated symptoms: no abdominal pain, no cough, no diaphoresis, no dizziness, no fever, no headache, no palpitations and no vomiting   Patient initially presented for left-sided chest pain radiating to her back and left arm.  Symptoms were present for the past 3 days.  She has had a history of chronic right-sided back pain but left-sided pain is new.  She endorsed some mild associated shortness of breath.  Per chart review, she had a stress test in May that was stopped early due to fatigue but was negative for ischemia.  A CT aortogram was ordered from triage.  Patient did undergo the study and immediately following this she experienced throat swelling, throat itchiness, shortness of breath, chest tightness, and nausea.  She was bedded in resus room.  And treated for anaphylaxis.  She endorsed continued chest pain that had evolved from her previous chest pain and now is located in the center of her chest.    Past Medical History:  Diagnosis Date   Anxiety    Back pain    Bipolar 1 disorder (HCC)    Chronic pain entered 08/15/2015   Treated with Oxycodone   Depression    Depression    Dyspnea    History of acute bronchitis    HTN (hypertension)    OA (osteoarthritis)    Obesity    PE (pulmonary embolism)    oct 2014   Sciatica     Patient Active Problem List   Diagnosis Date Noted   Allergic reaction caused by a drug 07/26/2021   Primary osteoarthritis of left knee 12/29/2019   Morbid obesity (HCC)    Bradycardia    Pulmonary embolism on long-term anticoagulation therapy (HCC) 04/06/2016   Pain in the chest    Chronic pain    Chest wall pain    Pulmonary embolism  (HCC) 07/31/2013   Vocal cord dysfunction 02/28/2012   Dyspnea 01/17/2012   Chest pain 11/25/2011   Hypokalemia 11/25/2011   GERD (gastroesophageal reflux disease) 03/08/2011   DYSMENORRHEA 11/10/2010   PLANTAR FASCIITIS 09/14/2010   OSTEOARTHRITIS, KNEES, BILATERAL, 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 01/03/2007   Irritable bowel syndrome 01/03/2007    Past Surgical History:  Procedure Laterality Date   ADENOIDECTOMY     BIOPSY STOMACH     CARDIAC CATHETERIZATION  2009   normal; in [place]   CESAREAN SECTION  2002   x 2, 1997 and 2002   CHOLECYSTECTOMY  2007   KNEE SURGERY Right 2005   TONSILLECTOMY     TOTAL KNEE ARTHROPLASTY Left 01/27/2020   Procedure: TOTAL KNEE ARTHROPLASTY;  Surgeon: [name], MD;  Location: [facility];  Service: Orthopedics;  Laterality: Left;   TYMPANOSTOMY TUBE PLACEMENT       OB History   No obstetric history on file.     Family History  Problem Relation Age  of Onset   Heart disease Mother    Diabetes Father    Asthma Sister        also had rheumatic fever as a child, died age 41    Social History   Tobacco Use   Smoking status: Never   Smokeless tobacco: Never   Tobacco comments:    second hand smoke  Vaping Use   Vaping Use: Never used  Substance Use Topics   Alcohol use: Yes    Alcohol/week: 0.0 standard drinks    Comment: 2-3 x week has a glass of wine   Drug use: Yes    Frequency: 1.0 times per week    Types: Marijuana    Home Medications Prior to Admission medications   Medication Sig Start Date End Date Taking? Authorizing Provider  aspirin 81 MG chewable tablet Chew 81 mg by mouth daily as needed for mild pain.    Yes [provider]  diazepam (VALIUM) 10 MG tablet Take 10 mg by mouth 2 (two) times daily. 06/25/21  Yes [provider]  furosemide (LASIX) 40 MG tablet Take 1 tablet (40 mg total) by mouth daily. 08/02/13  Yes Zannie Cove, MD  gabapentin (NEURONTIN) 300 MG capsule Take 2 capsules (600 mg total) by mouth 3 (three) times daily. Patient taking differently: Take 600 mg by mouth daily. 05/01/21 07/26/21 Yes Gerhard Munch, MD  meloxicam (MOBIC) 15 MG tablet Take 15 mg by mouth daily. 04/05/21  Yes [provider]  omeprazole (PRILOSEC) 40 MG capsule Take 40 mg by mouth daily.   Yes [provider]  phentermine (ADIPEX-P) 37.5 MG tablet Take 37.5 mg by mouth every morning.    Yes [provider]  potassium chloride SA (K-DUR,KLOR-CON) 20 MEQ tablet Take 20 mEq by mouth 2 (two) times daily. 05/03/16  Yes [provider]  Rivaroxaban (XARELTO) 20 MG TABS tablet Take 1 tablet (20 mg total) by mouth daily with supper. Starting 10/18, take 20mg  daily Patient taking differently: Take 20 mg by mouth daily with supper. 08/23/13  Yes 08/25/13, MD  cyclobenzaprine (FLEXERIL) 10 MG tablet Take 1 tablet (10 mg total) by mouth 2 (two) times daily as needed for muscle spasms. Patient not taking: No sig reported 10/01/20   10/03/20, MD    Allergies    Contrast media [iodinated diagnostic agents], Percocet [oxycodone-acetaminophen], Toradol [ketorolac tromethamine], Tylenol [acetaminophen], and Vicodin [hydrocodone-acetaminophen]  Review of Systems   Review of Systems  Constitutional:  Negative for appetite change, chills, diaphoresis and fever.  HENT:  Positive for trouble swallowing and voice change. Negative for congestion, ear pain and sore throat.   Eyes:  Negative for pain and visual disturbance.  Respiratory:  Positive for chest tightness and shortness of breath. Negative for cough.   Cardiovascular:  Positive for chest pain. Negative for  palpitations.  Gastrointestinal:  Positive for nausea. Negative for abdominal pain and vomiting.  Genitourinary:  Negative for dysuria, flank pain and hematuria.  Musculoskeletal:  Positive for back pain. Negative for arthralgias, myalgias and neck pain.  Skin:  Negative for color change and rash.  Neurological:  Negative for dizziness, seizures, syncope, light-headedness and headaches.  All other systems reviewed and are negative.  Physical Exam Updated Vital Signs BP 132/87   Pulse 77   Temp 97.7 F (36.5 C) (Oral)   Resp 14   Ht 5\' 5"  (1.651 m)   Wt 102.1 kg   LMP 12/28/2015 (Exact Date)   SpO2 96%   BMI  37.44 kg/m   Physical Exam Vitals and nursing note reviewed.  Constitutional:      General: She is in acute distress.     Appearance: She is well-developed and normal weight. She is ill-appearing. She is not toxic-appearing or diaphoretic.  HENT:     Head: Normocephalic and atraumatic.     Mouth/Throat:     Pharynx: Oropharynx is clear. Uvula midline. No posterior oropharyngeal erythema.  Eyes:     Conjunctiva/sclera: Conjunctivae normal.  Neck:     Vascular: No JVD.  Cardiovascular:     Rate and Rhythm: Normal rate and regular rhythm.     Heart sounds: No murmur heard. Pulmonary:     Effort: Tachypnea present. No accessory muscle usage or respiratory distress.     Breath sounds: Decreased breath sounds present. No wheezing or rales.     Comments: Shallow breaths Abdominal:     Palpations: Abdomen is soft.     Tenderness: There is no abdominal tenderness.  Musculoskeletal:     Cervical back: Neck supple.     Right lower leg: No edema.     Left lower leg: No edema.  Skin:    General: Skin is warm and dry.     Findings: Rash (Perioral urticaria) present.  Neurological:     General: No focal deficit present.     Mental Status: She is alert and oriented to person, place, and time.  Psychiatric:        Behavior: Behavior normal.    ED Results / Procedures /  Treatments   Labs (all labs ordered are listed, but only abnormal results are displayed) Labs Reviewed  COMPREHENSIVE METABOLIC PANEL - Abnormal; Notable for the following components:      Result Value   AST 13 (*)    All other components within normal limits  RESP PANEL BY RT-PCR (FLU A&B, COVID) ARPGX2  CBC WITH DIFFERENTIAL/PLATELET  HIV ANTIBODY (ROUTINE TESTING W REFLEX)  LIPID PANEL  TROPONIN I (HIGH SENSITIVITY)  TROPONIN I (HIGH SENSITIVITY)    EKG None  Radiology DG Chest 2 View  Result Date: 07/26/2021 CLINICAL DATA:  Chest pain EXAM: CHEST - 2 VIEW COMPARISON:  Chest radiograph 11/05/2020 FINDINGS: The cardiomediastinal silhouette is stable. There is no focal consolidation or pulmonary edema. There is no pleural effusion or pneumothorax. There is no acute osseous abnormality. IMPRESSION: No radiographic evidence of acute cardiopulmonary process. Electronically Signed   By: Lesia Hausen M.D.   On: 07/26/2021 12:39   CT Angio Chest/Abd/Pel for Dissection W and/or W/WO  Result Date: 07/26/2021 CLINICAL DATA:  Chest pain or back pain, aortic dissection suspected EXAM: CT ANGIOGRAPHY CHEST, ABDOMEN AND PELVIS TECHNIQUE: Non-contrast CT of the chest was initially obtained. Multidetector CT imaging through the chest, abdomen and pelvis was performed using the standard protocol during bolus administration of intravenous contrast. Multiplanar reconstructed images and MIPs were obtained and reviewed to evaluate the vascular anatomy. CONTRAST:  OMNIPAQUE IOHEXOL 350 MG/ML SOLN COMPARISON:  None. FINDINGS: CTA CHEST FINDINGS Cardiovascular: Thoracic aorta is normal in caliber. No evidence of intramural hematoma or aortic dissection. Heart size is normal. No pericardial effusion. Mediastinum/Nodes: No enlarged lymph nodes. Lungs/Pleura: No consolidation or mass. No pleural effusion or pneumothorax. Musculoskeletal: No acute abnormality. Review of the MIP images confirms the above  findings. CTA ABDOMEN AND PELVIS FINDINGS VASCULAR Aorta: Normal caliber.  No evidence of dissection. Celiac: Patent without stenosis. SMA: Patent without stenosis. Renals: Patent without stenosis. IMA: Patent without stenosis. Inflow: Patent.  Veins: Poorly evaluated. Review of the MIP images confirms the above findings. NON-VASCULAR Hepatobiliary: No focal liver abnormality is seen. Status post cholecystectomy. No biliary dilatation. Pancreas: Unremarkable. Spleen: Unremarkable. Adrenals/Urinary Tract: Unremarkable. Stomach/Bowel: Stomach within normal limits. Bowel is normal in caliber. Normal appendix. Lymphatic: No enlarged lymph nodes. Reproductive: Uterus and bilateral adnexa are unremarkable. Other: No free fluid.  Abdominal wall is unremarkable. Musculoskeletal: Lumbar spine degenerative changes. Review of the MIP images confirms the above findings. IMPRESSION: No evidence of aortic dissection or other acute abnormality. Electronically Signed   By: Guadlupe Spanish M.D.   On: 07/26/2021 14:27    Procedures Procedures   Medications Ordered in ED Medications  meloxicam (MOBIC) tablet 15 mg (has no administration in time range)  furosemide (LASIX) tablet 40 mg (has no administration in time range)  diazepam (VALIUM) tablet 10 mg (has no administration in time range)  rivaroxaban (XARELTO) tablet 20 mg (has no administration in time range)  gabapentin (NEURONTIN) capsule 600 mg (has no administration in time range)  potassium chloride SA (KLOR-CON) CR tablet 20 mEq (has no administration in time range)  ondansetron (ZOFRAN) injection 4 mg (has no administration in time range)  methylPREDNISolone sodium succinate (SOLU-MEDROL) 40 mg/mL injection 40 mg (has no administration in time range)  diphenhydrAMINE (BENADRYL) 12.5 MG/5ML elixir 25 mg (has no administration in time range)  famotidine (PEPCID) IVPB 20 mg premix (has no administration in time range)  iohexol (OMNIPAQUE) 350 MG/ML injection 100  mL (100 mLs Intravenous Contrast Given 07/26/21 1321)  EPINEPHrine (EPI-PEN) 0.3 mg/0.3 mL injection (0.3 mg  Given 07/26/21 1405)  lactated ringers bolus 1,000 mL (0 mLs Intravenous Stopped 07/26/21 1626)  methylPREDNISolone sodium succinate (SOLU-MEDROL) 125 mg/2 mL injection 125 mg (125 mg Intravenous Given 07/26/21 1400)  famotidine (PEPCID) IVPB 20 mg premix (0 mg Intravenous Stopped 07/26/21 1435)  diphenhydrAMINE (BENADRYL) injection 25 mg (25 mg Intravenous Given 07/26/21 1400)  ondansetron (ZOFRAN) injection 4 mg (4 mg Intravenous Given 07/26/21 1401)  EPINEPHrine (EPI-PEN) 0.3 mg/0.3 mL injection (0.3 mg  Given 07/26/21 1355)  albuterol (PROVENTIL) (2.5 MG/3ML) 0.083% nebulizer solution 2.5 mg (2.5 mg Nebulization Given 07/26/21 1422)  EPINEPHrine (EPI-PEN) 0.3 mg/0.3 mL injection (0.3 mg  Given 07/26/21 1448)    ED Course  I have reviewed the triage vital signs and the nursing notes.  Pertinent labs & imaging results that were available during my care of the patient were reviewed by me and considered in my medical decision making (see chart for details).    MDM Rules/Calculators/A&P                         CRITICAL CARE Performed by: Gloris Manchester   Total critical care time: 40 minutes  Critical care time was exclusive of separately billable procedures and treating other patients.  Critical care was necessary to treat or prevent imminent or life-threatening deterioration.  Critical care was time spent personally by me on the following activities: development of treatment plan with patient and/or surrogate as well as nursing, discussions with consultants, evaluation of patient's response to treatment, examination of patient, obtaining history from patient or surrogate, ordering and performing treatments and interventions, ordering and review of laboratory studies, ordering and review of radiographic studies, pulse oximetry and re-evaluation of patient's condition.   Patient is a  55 year old female with history of chronic back pain, bipolar disorder, presenting for 3 days of left-sided chest pain radiating to her back with some associated shortness  of breath.  She was initially seen in ED triage and diagnostic studies were ordered.  She underwent a CT angiogram.  Following the contrasted study, she developed an urticarial rash around her mouth, sensation of throat tightness, dryness, and itchiness, chest tightness, and nausea.  Oropharynx remains patent but did appear to have some mild swelling.  Symptoms are consistent with an anaphylactic reaction.  Patient was treated for anaphylaxis with IM epinephrine, bolus of IV fluids, Pepcid, Solu-Medrol, and Benadryl.  She had persistent symptoms while in the ED and did get 2 additional doses of intramuscular epinephrine.  Following this, she reported some improvement.  She was continuously monitored.  CT aortogram was negative for acute findings.  She did endorse continued chest pains and repeat EKG was ordered.  EKG continues to be negative for any evidence of ischemia.  Given her severe reaction, patient to be admitted for monitoring.  Final Clinical Impression(s) / ED Diagnoses Final diagnoses:  Anaphylaxis, initial encounter    Rx / DC Orders ED Discharge Orders     None        Gloris Manchester, MD 07/26/21 1708

## 2021-07-26 NOTE — ED Provider Notes (Signed)
Emergency Medicine Provider Triage Evaluation Note  Katie [name] , a 55 y.o. female  was evaluated in triage.  Pt complains of chest pain radiating down her back and into her left arm that has been constant and worsening since Sunday.  She reports she has a history of chronic spinal issues and has had similar pain on her right but has never had a go to her left like this.  She reports some mild associated shortness of breath.  She reports some numbness and weakness in the left arm.  Reports she had a reassuring stress test a few months ago..  Review of Systems  Positive: Chest pain, back pain, arm numbness Negative: Abdominal pain, nausea, vomiting, fever, cough  Physical Exam  BP 111/84 (BP Location: Left Arm)   Pulse (!) 58   Temp 97.7 F (36.5 C) (Oral)   Resp 19   LMP 12/28/2015 (Exact Date)   SpO2 93%  Gen:   Awake, no distress   Resp:  Normal effort  MSK:   Moves extremities without difficulty  Other:  Pulses present and equal in bilateral upper extremities, sensation and strength intact in bilateral upper extremities  Medical Decision Making  Medically screening exam initiated at 11:43 AM.  Appropriate orders placed.  [name] was informed that the remainder of the evaluation will be completed by another provider, this initial triage assessment does not replace that evaluation, and the importance of remaining in the ED until their evaluation is complete.     Dartha Lodge, PA-C 07/26/21 1145    Gloris Manchester, MD 07/26/21 [phone]

## 2021-07-27 ENCOUNTER — Ambulatory Visit: Payer: Medicaid Other

## 2021-07-27 ENCOUNTER — Observation Stay (HOSPITAL_BASED_OUTPATIENT_CLINIC_OR_DEPARTMENT_OTHER): Payer: Medicaid Other

## 2021-07-27 DIAGNOSIS — Z96642 Presence of left artificial hip joint: Secondary | ICD-10-CM | POA: Diagnosis not present

## 2021-07-27 DIAGNOSIS — T7840XA Allergy, unspecified, initial encounter: Secondary | ICD-10-CM | POA: Diagnosis not present

## 2021-07-27 DIAGNOSIS — R079 Chest pain, unspecified: Secondary | ICD-10-CM

## 2021-07-27 LAB — LIPID PANEL
Cholesterol: 181 mg/dL (ref 0–200)
HDL: 76 mg/dL (ref >40)
LDL Cholesterol: 101 mg/dL — ABNORMAL HIGH (ref 0–99)
Total CHOL/HDL Ratio: 2.4 RATIO
Triglycerides: 20 mg/dL (ref <150)
VLDL: 4 mg/dL (ref 0–40)

## 2021-07-27 LAB — ECHOCARDIOGRAM COMPLETE
Area-P 1/2: 3.39 cm2
Height: 65 in
S' Lateral: 2.9 cm
Single Plane A4C EF: 58.2 %
Weight: 3600 oz

## 2021-07-27 LAB — HIV ANTIBODY (ROUTINE TESTING W REFLEX): HIV Screen 4th Generation wRfx: NONREACTIVE

## 2021-07-27 MED ORDER — PREDNISONE 20 MG PO TABS: 20.0000 mg | ORAL_TABLET | Freq: Every day | ORAL | 0 refills | Status: AC

## 2021-07-27 MED ORDER — DIPHENHYDRAMINE HCL 25 MG PO TABS: 25.0000 mg | ORAL_TABLET | Freq: Four times a day (QID) | ORAL | 0 refills | Status: DC | PRN

## 2021-07-27 MED ORDER — FAMOTIDINE 20 MG PO TABS: 20.0000 mg | ORAL_TABLET | Freq: Every day | ORAL | 0 refills | Status: DC

## 2021-07-27 NOTE — Discharge Summary (Addendum)
Physician Discharge Summary  Katie [name] WUJ:[id number] DOB: 1965-12-30 DOA: 07/26/2021  PCP: Katie Emery, MD  Admit date: 07/26/2021 Discharge date: 07/27/2021  Admitted From: Home Disposition:  Home  Discharge Condition:Stable CODE STATUS:FULL Diet recommendation: Heart Healthy    Brief/Interim Summary:   HPI( as per Dr Katie [name]): Katie [name] is a 55 y.o. female with medical history significant of HTN, chronic pain, anxiety, HTN. Presenting with chest pain. Reports that it started 2 days ago. It was left side radiating to the neck and arm. It's a constant throbbing and sharp pain. She didn't have any other symptoms. She didn't try anything for her pain except rest. She became concerned when her symptoms did not abate this morning. She denies any other aggravating or alleviating factors.     ED Course: EKG was negative. Trp was negative x 2. She was sent for a CTA chest/ab/pelvis. It was negative for acute lesion. Shortly after testing, she had tightness in her chest, dyspnea, nausea, and throat/lip swelling. She was given epi x 3, solumedrol, benadryl and pepcid. She improved. [facility] was called for  admission.   Hospital course:  Patient's hospital course remained stable.  She was hemodynamically stable during my evaluation today.  Her symptoms of chest tightness, dyspnea, throat/lip swelling have completely resolved. Echocardiogram showed EF of 55 to 60%, no wall motion abnormality.  CT angio did not show any acute intrathoracic abnormality. Her LDL was found to be 101, I will defer this to her PCP for treatment if indicated. Patient says she has chronic chest pain.  Her chest has improved.  She has chronic neck pain that radiates to her left chest.  She follows with neurosurgery, physical therapy for this.  EKG has been reviewed, no clear signs of ischemia.  Troponins were fairly negative.  Most likely her chest pain was musculoskeletal in etiology.  She takes meloxicam at home.  She also  takes PPI for GERD. Patient is medically stable for discharge home today.  I will continue short course of prednisone, Pepcid, Benadryl   Discharge Diagnoses:  Active Problems:   Allergic reaction caused by a drug    Discharge Instructions  Discharge Instructions     Diet - low sodium heart healthy   Complete by: As directed    Discharge instructions   Complete by: As directed    1)Please take prescribed medications as instructed 2)Follow up with your PCP in a week   Increase activity slowly   Complete by: As directed       Allergies as of 07/27/2021       Reactions   Contrast Media [iodinated Diagnostic Agents] Anaphylaxis   Percocet [oxycodone-acetaminophen] Hives, Other (See Comments)   From the Tylenol, she can take oxycodone by itself   Toradol [ketorolac Tromethamine] Other (See Comments)   Not effective   Tylenol [acetaminophen] Hives, Swelling   Vicodin [hydrocodone-acetaminophen] Hives, Swelling        Medication List     STOP taking these medications    cyclobenzaprine 10 MG tablet Commonly known as: FLEXERIL       TAKE these medications    aspirin 81 MG chewable tablet Chew 81 mg by mouth daily as needed for mild pain.   diazepam 10 MG tablet Commonly known as: VALIUM Take 10 mg by mouth 2 (two) times daily.   diphenhydrAMINE 25 MG tablet Commonly known as: BENADRYL Take 1 tablet (25 mg total) by mouth every 6 (six) hours as needed for itching.  famotidine 20 MG tablet Commonly known as: PEPCID Take 1 tablet (20 mg total) by mouth daily for 3 days.   furosemide 40 MG tablet Commonly known as: LASIX Take 1 tablet (40 mg total) by mouth daily.   gabapentin 300 MG capsule Commonly known as: Neurontin Take 2 capsules (600 mg total) by mouth 3 (three) times daily. What changed: when to take this   meloxicam 15 MG tablet Commonly known as: MOBIC Take 15 mg by mouth daily.   omeprazole 40 MG capsule Commonly known as: PRILOSEC Take  40 mg by mouth daily.   phentermine 37.5 MG tablet Commonly known as: ADIPEX-P Take 37.5 mg by mouth every morning.   potassium chloride SA 20 MEQ tablet Commonly known as: KLOR-CON Take 20 mEq by mouth 2 (two) times daily.   predniSONE 20 MG tablet Commonly known as: DELTASONE Take 1 tablet (20 mg total) by mouth daily for 3 days.   rivaroxaban 20 MG Tabs tablet Commonly known as: XARELTO Take 1 tablet (20 mg total) by mouth daily with supper. Starting 10/18, take 20mg  daily What changed: additional instructions        Follow-up Information     , MD. Schedule an appointment as soon as possible for a visit in 1 week(s).   Specialty: Internal Medicine Contact information: [address] [place] [phone]                Allergies  Allergen Reactions   Contrast Media [Iodinated Diagnostic Agents] Anaphylaxis   Percocet [Oxycodone-Acetaminophen] Hives and Other (See Comments)    From the Tylenol, she can take oxycodone by itself   Toradol [Ketorolac Tromethamine] Other (See Comments)    Not effective   Tylenol [Acetaminophen] Hives and Swelling   Vicodin [Hydrocodone-Acetaminophen] Hives and Swelling    Consultations: None   Procedures/Studies: DG Chest 2 View  Result Date: 07/26/2021 CLINICAL DATA:  Chest pain EXAM: CHEST - 2 VIEW COMPARISON:  Chest radiograph 11/05/2020 FINDINGS: The cardiomediastinal silhouette is stable. There is no focal consolidation or pulmonary edema. There is no pleural effusion or pneumothorax. There is no acute osseous abnormality. IMPRESSION: No radiographic evidence of acute cardiopulmonary process. Electronically Signed   By: 11/07/2020 M.D.   On: 07/26/2021 12:39   ECHOCARDIOGRAM COMPLETE  Result Date: 07/27/2021    ECHOCARDIOGRAM REPORT   Patient Name:   Katie [name] Date of Exam: 07/27/2021 Medical Rec #:  07/29/2021    Height:       65.0 in Accession #:    [id number]   Weight:        225.0 lb Date of Birth:  12-02-1965    BSA:          2.080 m Patient Age:    54 years     BP:           119/90 mmHg Patient Gender: F            HR:           50 bpm. Exam Location:  Inpatient Procedure: 2D Echo, Cardiac Doppler and Color Doppler Indications:    Chest pain  History:        Patient has prior history of Echocardiogram examinations, most                 recent 04/06/2016. Signs/Symptoms:Chest Pain and Dyspnea; Risk                 Factors:Hypertension  and Obesity.  Sonographer:    Lavenia Atlas RDCS Referring Phys: [id number] Teddy Spike IMPRESSIONS  1. Left ventricular ejection fraction, by estimation, is 55 to 60%. The left ventricle has normal function. The left ventricle has no regional wall motion abnormalities. There is mild left ventricular hypertrophy. Left ventricular diastolic parameters were normal.  2. Right ventricular systolic function is normal. The right ventricular size is normal. There is normal pulmonary artery systolic pressure. The estimated right ventricular systolic pressure is 32.2 mmHg.  3. The mitral valve is normal in structure. Trivial mitral valve regurgitation. No evidence of mitral stenosis.  4. The aortic valve is tricuspid. Aortic valve regurgitation is not visualized. No aortic stenosis is present.  5. The inferior vena cava is normal in size with greater than 50% respiratory variability, suggesting right atrial pressure of 3 mmHg. FINDINGS  Left Ventricle: Left ventricular ejection fraction, by estimation, is 55 to 60%. The left ventricle has normal function. The left ventricle has no regional wall motion abnormalities. The left ventricular internal cavity size was normal in size. There is  mild left ventricular hypertrophy. Left ventricular diastolic parameters were normal. Right Ventricle: The right ventricular size is normal. No increase in right ventricular wall thickness. Right ventricular systolic function is normal. There is normal pulmonary artery systolic  pressure. The tricuspid regurgitant velocity is 2.70 m/s, and  with an assumed right atrial pressure of 3 mmHg, the estimated right ventricular systolic pressure is 32.2 mmHg. Left Atrium: Left atrial size was normal in size. Right Atrium: Right atrial size was normal in size. Pericardium: There is no evidence of pericardial effusion. Mitral Valve: The mitral valve is normal in structure. Trivial mitral valve regurgitation. No evidence of mitral valve stenosis. Tricuspid Valve: The tricuspid valve is normal in structure. Tricuspid valve regurgitation is not demonstrated. No evidence of tricuspid stenosis. Aortic Valve: The aortic valve is tricuspid. Aortic valve regurgitation is not visualized. No aortic stenosis is present. Pulmonic Valve: The pulmonic valve was normal in structure. Pulmonic valve regurgitation is trivial. No evidence of pulmonic stenosis. Aorta: The aortic root is normal in size and structure. Venous: The inferior vena cava is normal in size with greater than 50% respiratory variability, suggesting right atrial pressure of 3 mmHg. IAS/Shunts: No atrial level shunt detected by color flow Doppler.  LEFT VENTRICLE PLAX 2D LVIDd:         4.90 cm      Diastology LVIDs:         2.90 cm      LV e' medial:    6.85 cm/s LV PW:         1.00 cm      LV E/e' medial:  11.9 LV IVS:        1.10 cm      LV e' lateral:   8.81 cm/s LVOT diam:     2.00 cm      LV E/e' lateral: 9.3 LV SV:         65 LV SV Index:   31 LVOT Area:     3.14 cm  LV Volumes (MOD) LV vol d, MOD A4C: 113.0 ml LV vol s, MOD A4C: 47.2 ml LV SV MOD A4C:     113.0 ml RIGHT VENTRICLE RV Basal diam:  2.00 cm RV S prime:     7.07 cm/s TAPSE (M-mode): 3.2 cm LEFT ATRIUM             Index  RIGHT ATRIUM          Index LA diam:        4.00 cm 1.92 cm/m  RA Area:     7.06 cm LA Vol (A2C):   29.8 ml 14.33 ml/m RA Volume:   11.40 ml 5.48 ml/m LA Vol (A4C):   42.0 ml 20.20 ml/m LA Biplane Vol: 38.8 ml 18.66 ml/m  AORTIC VALVE LVOT Vmax:   88.10  cm/s LVOT Vmean:  48.900 cm/s LVOT VTI:    0.208 m  AORTA Ao Root diam: 3.00 cm MITRAL VALVE               TRICUSPID VALVE MV Area (PHT): 3.39 cm    TR Peak grad:   29.2 mmHg MV Decel Time: 224 msec    TR Vmax:        270.00 cm/s MV E velocity: 81.80 cm/s MV A velocity: 72.40 cm/s  SHUNTS MV E/A ratio:  1.13        Systemic VTI:  0.21 m                            Systemic Diam: 2.00 cm Weston Brass MD Electronically signed by Weston Brass MD Signature Date/Time: 07/27/2021/10:26:55 AM    Final    CT Angio Chest/Abd/Pel for Dissection W and/or W/WO  Result Date: 07/26/2021 CLINICAL DATA:  Chest pain or back pain, aortic dissection suspected EXAM: CT ANGIOGRAPHY CHEST, ABDOMEN AND PELVIS TECHNIQUE: Non-contrast CT of the chest was initially obtained. Multidetector CT imaging through the chest, abdomen and pelvis was performed using the standard protocol during bolus administration of intravenous contrast. Multiplanar reconstructed images and MIPs were obtained and reviewed to evaluate the vascular anatomy. CONTRAST:  OMNIPAQUE IOHEXOL 350 MG/ML SOLN COMPARISON:  None. FINDINGS: CTA CHEST FINDINGS Cardiovascular: Thoracic aorta is normal in caliber. No evidence of intramural hematoma or aortic dissection. Heart size is normal. No pericardial effusion. Mediastinum/Nodes: No enlarged lymph nodes. Lungs/Pleura: No consolidation or mass. No pleural effusion or pneumothorax. Musculoskeletal: No acute abnormality. Review of the MIP images confirms the above findings. CTA ABDOMEN AND PELVIS FINDINGS VASCULAR Aorta: Normal caliber.  No evidence of dissection. Celiac: Patent without stenosis. SMA: Patent without stenosis. Renals: Patent without stenosis. IMA: Patent without stenosis. Inflow: Patent. Veins: Poorly evaluated. Review of the MIP images confirms the above findings. NON-VASCULAR Hepatobiliary: No focal liver abnormality is seen. Status post cholecystectomy. No biliary dilatation. Pancreas:  Unremarkable. Spleen: Unremarkable. Adrenals/Urinary Tract: Unremarkable. Stomach/Bowel: Stomach within normal limits. Bowel is normal in caliber. Normal appendix. Lymphatic: No enlarged lymph nodes. Reproductive: Uterus and bilateral adnexa are unremarkable. Other: No free fluid.  Abdominal wall is unremarkable. Musculoskeletal: Lumbar spine degenerative changes. Review of the MIP images confirms the above findings. IMPRESSION: No evidence of aortic dissection or other acute abnormality. Electronically Signed   By: Guadlupe Spanish M.D.   On: 07/26/2021 14:27      Subjective: Patient seen and examined the bedside this morning.  Hemodynamically stable for discharge.  Discharge Exam: Vitals:   07/27/21 0500 07/27/21 0800  BP: 128/77 119/90  Pulse:  73  Resp:  18  Temp: 97.7 F (36.5 C) 98 F (36.7 C)  SpO2: 97% 100%   Vitals:   07/26/21 1709 07/26/21 2029 07/27/21 0500 07/27/21 0800  BP: 139/85 115/73 128/77 119/90  Pulse: (!) 56 (!) 50  73  Resp: Temp: (!) 97.3 F (36.3 C) 97.7  F (36.5 C) 97.7 F (36.5 C) 98 F (36.7 C)  TempSrc: Oral Oral Oral Oral  SpO2: 99% 100% 97% 100%  Weight:      Height:        General: Pt is alert, awake, not in acute distress,obese Cardiovascular: RRR, S1/S2 +, no rubs, no gallops Respiratory: CTA bilaterally, no wheezing, no rhonchi Abdominal: Soft, NT, ND, bowel sounds + Extremities: no edema, no cyanosis    The results of significant diagnostics from this hospitalization (including imaging, microbiology, ancillary and laboratory) are listed below for reference.     Microbiology: Recent Results (from the past 240 hour(s))  Resp Panel by RT-PCR (Flu A&B, Covid) Nasopharyngeal Swab     Status: None   Collection Time: 07/26/21  4:07 PM   Specimen: Nasopharyngeal Swab; Nasopharyngeal(NP) swabs in vial transport medium  Result Value Ref Range Status   SARS Coronavirus 2 by RT PCR NEGATIVE NEGATIVE Final    Comment:  (NOTE) SARS-CoV-2 target nucleic acids are NOT DETECTED.  The SARS-CoV-2 RNA is generally detectable in upper respiratory specimens during the acute phase of infection. The lowest concentration of SARS-CoV-2 viral copies this assay can detect is 138 copies/mL. A negative result does not preclude SARS-Cov-2 infection and should not be used as the sole basis for treatment or other patient management decisions. A negative result may occur with  improper specimen collection/handling, submission of specimen other than nasopharyngeal swab, presence of viral mutation(s) within the areas targeted by this assay, and inadequate number of viral copies(<138 copies/mL). A negative result must be combined with clinical observations, patient history, and epidemiological information. The expected result is Negative.  Fact Sheet for Patients:  BloggerCourse.com  Fact Sheet for Healthcare Providers:  SeriousBroker.it  This test is no t yet approved or cleared by the [place] FDA and  has been authorized for detection and/or diagnosis of SARS-CoV-2 by FDA under an Emergency Use Authorization (EUA). This EUA will remain  in effect (meaning this test can be used) for the duration of the COVID-19 declaration under Section 564(b)(1) of the Act, 21 U.S.C.section 360bbb-3(b)(1), unless the authorization is terminated  or revoked sooner.       Influenza A by PCR NEGATIVE NEGATIVE Final   Influenza B by PCR NEGATIVE NEGATIVE Final    Comment: (NOTE) The Xpert Xpress SARS-CoV-2/FLU/RSV plus assay is intended as an aid in the diagnosis of influenza from Nasopharyngeal swab specimens and should not be used as a sole basis for treatment. Nasal washings and aspirates are unacceptable for Xpert Xpress SARS-CoV-2/FLU/RSV testing.  Fact Sheet for Patients: BloggerCourse.com  Fact Sheet for Healthcare  Providers: SeriousBroker.it  This test is not yet approved or cleared by the [place] FDA and has been authorized for detection and/or diagnosis of SARS-CoV-2 by FDA under an Emergency Use Authorization (EUA). This EUA will remain in effect (meaning this test can be used) for the duration of the COVID-19 declaration under Section 564(b)(1) of the Act, 21 U.S.C. section 360bbb-3(b)(1), unless the authorization is terminated or revoked.  Performed at [facility], [address] [place], [place] [address]      Labs: BNP (last 3 results) No results for input(s): BNP in the last 8760 hours. Basic Metabolic Panel: Recent Labs  Lab 07/26/21 1149  NA 139  K 3.5  CL 103  CO2 30  GLUCOSE 92  BUN 13  CREATININE 0.77  CALCIUM 9.3   Liver Function Tests: Recent Labs  Lab 07/26/21 1149  AST  13*  ALT 9  ALKPHOS 94  BILITOT 0.6  PROT 7.9  ALBUMIN 3.7   No results for input(s): LIPASE, AMYLASE in the last 168 hours. No results for input(s): AMMONIA in the last 168 hours. CBC: Recent Labs  Lab 07/26/21 1149  WBC 7.5  NEUTROABS 3.2  HGB 13.7  HCT 41.6  MCV 98.3  PLT 300   Cardiac Enzymes: No results for input(s): CKTOTAL, CKMB, CKMBINDEX, TROPONINI in the last 168 hours. BNP: Invalid input(s): POCBNP CBG: No results for input(s): GLUCAP in the last 168 hours. D-Dimer No results for input(s): DDIMER in the last 72 hours. Hgb A1c No results for input(s): HGBA1C in the last 72 hours. Lipid Profile Recent Labs    07/27/21 0416  CHOL 181  HDL 76  LDLCALC 101*  TRIG 20  CHOLHDL 2.4   Thyroid function studies No results for input(s): TSH, T4TOTAL, T3FREE, THYROIDAB in the last 72 hours.  Invalid input(s): FREET3 Anemia work up No results for input(s): VITAMINB12, FOLATE, FERRITIN, TIBC, IRON, RETICCTPCT in the last 72 hours. Urinalysis    Component Value Date/Time   COLORURINE STRAW (A) 01/19/2020 1423    APPEARANCEUR CLEAR 01/19/2020 1423   LABSPEC 1.004 (L) 01/19/2020 1423   PHURINE 7.0 01/19/2020 1423   GLUCOSEU NEGATIVE 01/19/2020 1423   HGBUR NEGATIVE 01/19/2020 1423   BILIRUBINUR NEGATIVE 01/19/2020 1423   KETONESUR NEGATIVE 01/19/2020 1423   PROTEINUR NEGATIVE 01/19/2020 1423   UROBILINOGEN 0.2 12/03/2013 2345   NITRITE NEGATIVE 01/19/2020 1423   LEUKOCYTESUR NEGATIVE 01/19/2020 1423   Sepsis Labs Invalid input(s): PROCALCITONIN,  WBC,  LACTICIDVEN Microbiology Recent Results (from the past 240 hour(s))  Resp Panel by RT-PCR (Flu A&B, Covid) Nasopharyngeal Swab     Status: None   Collection Time: 07/26/21  4:07 PM   Specimen: Nasopharyngeal Swab; Nasopharyngeal(NP) swabs in vial transport medium  Result Value Ref Range Status   SARS Coronavirus 2 by RT PCR NEGATIVE NEGATIVE Final    Comment: (NOTE) SARS-CoV-2 target nucleic acids are NOT DETECTED.  The SARS-CoV-2 RNA is generally detectable in upper respiratory specimens during the acute phase of infection. The lowest concentration of SARS-CoV-2 viral copies this assay can detect is 138 copies/mL. A negative result does not preclude SARS-Cov-2 infection and should not be used as the sole basis for treatment or other patient management decisions. A negative result may occur with  improper specimen collection/handling, submission of specimen other than nasopharyngeal swab, presence of viral mutation(s) within the areas targeted by this assay, and inadequate number of viral copies(<138 copies/mL). A negative result must be combined with clinical observations, patient history, and epidemiological information. The expected result is Negative.  Fact Sheet for Patients:  BloggerCourse.com  Fact Sheet for Healthcare Providers:  SeriousBroker.it  This test is no t yet approved or cleared by the [place] FDA and  has been authorized for detection and/or diagnosis of  SARS-CoV-2 by FDA under an Emergency Use Authorization (EUA). This EUA will remain  in effect (meaning this test can be used) for the duration of the COVID-19 declaration under Section 564(b)(1) of the Act, 21 U.S.C.section 360bbb-3(b)(1), unless the authorization is terminated  or revoked sooner.       Influenza A by PCR NEGATIVE NEGATIVE Final   Influenza B by PCR NEGATIVE NEGATIVE Final    Comment: (NOTE) The Xpert Xpress SARS-CoV-2/FLU/RSV plus assay is intended as an aid in the diagnosis of influenza from Nasopharyngeal swab specimens and should not be used as a sole  basis for treatment. Nasal washings and aspirates are unacceptable for Xpert Xpress SARS-CoV-2/FLU/RSV testing.  Fact Sheet for Patients: BloggerCourse.com  Fact Sheet for Healthcare Providers: SeriousBroker.it  This test is not yet approved or cleared by the [place] FDA and has been authorized for detection and/or diagnosis of SARS-CoV-2 by FDA under an Emergency Use Authorization (EUA). This EUA will remain in effect (meaning this test can be used) for the duration of the COVID-19 declaration under Section 564(b)(1) of the Act, 21 U.S.C. section 360bbb-3(b)(1), unless the authorization is terminated or revoked.  Performed at [facility], [address] [place], [place] [address]     Please note: You were cared for by a hospitalist during your hospital stay. Once you are discharged, your primary care physician will handle any further medical issues. Please note that NO REFILLS for any discharge medications will be authorized once you are discharged, as it is imperative that you return to your primary care physician (or establish a relationship with a primary care physician if you do not have one) for your post hospital discharge needs so that they can reassess your need for medications and monitor your lab values.    Time  coordinating discharge: 40 minutes  SIGNED:   Burnadette Pop, MD  [facility] 07/27/2021, 11:20 AM Pager [phone]  If 7PM-7AM, please contact night-coverage [email or web address] Password [email or web address]

## 2021-07-27 NOTE — TOC Transition Note (Signed)
Transition of Care [facility]) - CM/SW Discharge Note   Patient Details  Name: Katie [name] MRN: [id number] Date of Birth: [birth year]  [facility] CM/SW Contact:  Lanier Clam, RN Phone Number: 07/27/2021, 12:11 PM   Clinical Narrative:  d/c home No needs.           Patient Goals and CMS Choice        Discharge Placement                       Discharge Plan and Services                                     Social Determinants of Health (SDOH) Interventions     Readmission Risk Interventions No flowsheet data found.

## 2021-07-27 NOTE — Progress Notes (Signed)
  Echocardiogram 2D Echocardiogram has been performed.  Pieter Partridge 07/27/2021, 8:37 AM

## 2021-07-28 DIAGNOSIS — Z96642 Presence of left artificial hip joint: Secondary | ICD-10-CM | POA: Diagnosis not present

## 2021-07-29 DIAGNOSIS — Z96642 Presence of left artificial hip joint: Secondary | ICD-10-CM | POA: Diagnosis not present

## 2021-07-30 DIAGNOSIS — Z96642 Presence of left artificial hip joint: Secondary | ICD-10-CM | POA: Diagnosis not present

## 2021-07-31 DIAGNOSIS — Z96642 Presence of left artificial hip joint: Secondary | ICD-10-CM | POA: Diagnosis not present

## 2021-08-01 ENCOUNTER — Ambulatory Visit: Payer: Medicaid Other

## 2021-08-01 DIAGNOSIS — Z96642 Presence of left artificial hip joint: Secondary | ICD-10-CM | POA: Diagnosis not present

## 2021-08-02 DIAGNOSIS — Z96642 Presence of left artificial hip joint: Secondary | ICD-10-CM | POA: Diagnosis not present

## 2021-08-03 DIAGNOSIS — Z96642 Presence of left artificial hip joint: Secondary | ICD-10-CM | POA: Diagnosis not present

## 2021-08-04 DIAGNOSIS — Z96642 Presence of left artificial hip joint: Secondary | ICD-10-CM | POA: Diagnosis not present

## 2021-08-05 DIAGNOSIS — Z96642 Presence of left artificial hip joint: Secondary | ICD-10-CM | POA: Diagnosis not present

## 2021-08-06 DIAGNOSIS — Z96642 Presence of left artificial hip joint: Secondary | ICD-10-CM | POA: Diagnosis not present

## 2021-08-07 DIAGNOSIS — Z96642 Presence of left artificial hip joint: Secondary | ICD-10-CM | POA: Diagnosis not present

## 2021-08-08 ENCOUNTER — Ambulatory Visit: Payer: Medicaid Other

## 2021-08-08 ENCOUNTER — Telehealth: Payer: Self-pay

## 2021-08-08 DIAGNOSIS — Z96642 Presence of left artificial hip joint: Secondary | ICD-10-CM | POA: Diagnosis not present

## 2021-08-08 NOTE — Telephone Encounter (Signed)
Unable to leave voicemail due to mailbox being full regarding missed PT appointment. Will attempt MyChart message.

## 2021-08-09 DIAGNOSIS — Z96642 Presence of left artificial hip joint: Secondary | ICD-10-CM | POA: Diagnosis not present

## 2021-08-10 ENCOUNTER — Ambulatory Visit: Payer: Medicaid Other | Attending: Neurosurgery | Admitting: Physical Therapy

## 2021-08-10 ENCOUNTER — Encounter: Payer: Self-pay | Admitting: Physical Therapy

## 2021-08-10 ENCOUNTER — Other Ambulatory Visit: Payer: Self-pay

## 2021-08-10 DIAGNOSIS — M25562 Pain in left knee: Secondary | ICD-10-CM | POA: Diagnosis not present

## 2021-08-10 DIAGNOSIS — M25662 Stiffness of left knee, not elsewhere classified: Secondary | ICD-10-CM | POA: Insufficient documentation

## 2021-08-10 DIAGNOSIS — R262 Difficulty in walking, not elsewhere classified: Secondary | ICD-10-CM | POA: Diagnosis not present

## 2021-08-10 DIAGNOSIS — M542 Cervicalgia: Secondary | ICD-10-CM | POA: Insufficient documentation

## 2021-08-10 DIAGNOSIS — Z96642 Presence of left artificial hip joint: Secondary | ICD-10-CM | POA: Diagnosis not present

## 2021-08-10 DIAGNOSIS — R252 Cramp and spasm: Secondary | ICD-10-CM | POA: Diagnosis not present

## 2021-08-10 DIAGNOSIS — R2681 Unsteadiness on feet: Secondary | ICD-10-CM | POA: Insufficient documentation

## 2021-08-10 DIAGNOSIS — R296 Repeated falls: Secondary | ICD-10-CM | POA: Diagnosis not present

## 2021-08-10 DIAGNOSIS — M6281 Muscle weakness (generalized): Secondary | ICD-10-CM | POA: Diagnosis not present

## 2021-08-10 NOTE — Therapy (Signed)
[facility] Outpatient Rehabilitation [facility] [address] [place], [place], [address] Phone: [phone]   Fax:  [phone]  Physical Therapy Treatment  Patient Details  Name: ANZLEY DIBBERN MRN: [id number] Date of Birth: 1966/08/29 Referring Provider (PT): Coletta Memos MD   Encounter Date: 08/10/2021   PT End of Session - 08/10/21 1009     Visit Number 2    Number of Visits 16    Date for PT Re-Evaluation 09/06/21    Authorization Type healthy Blue  no TX, esitm , vaso or ionto    PT Start Time 1011    PT Stop Time 1059    PT Time Calculation (min) 48 min    Activity Tolerance Patient tolerated treatment well    Behavior During Therapy [facility] for tasks assessed/performed             Past Medical History:  Diagnosis Date   Anxiety    Back pain    Bipolar 1 disorder (HCC)    Chronic pain entered 08/15/2015   Treated with Oxycodone   Depression    Depression    Dyspnea    History of acute bronchitis    HTN (hypertension)    OA (osteoarthritis)    Obesity    PE (pulmonary embolism)    oct 2014   Sciatica     Past Surgical History:  Procedure Laterality Date   ADENOIDECTOMY     BIOPSY STOMACH     CARDIAC CATHETERIZATION  2009   normal; in [place]   CESAREAN SECTION  2002   x 2, 1997 and 2002   CHOLECYSTECTOMY  2007   KNEE SURGERY Right 2005   TONSILLECTOMY     TOTAL KNEE ARTHROPLASTY Left 01/27/2020   Procedure: TOTAL KNEE ARTHROPLASTY;  Surgeon: [name], MD;  Location: [facility];  Service: Orthopedics;  Laterality: Left;   TYMPANOSTOMY TUBE PLACEMENT      There were no vitals filed for this visit.   Subjective Assessment - 08/10/21 1014     Subjective " I missed my last appointment due to going to the ED because I was having chest pain/ soreness. The neck feels tight with radiating symptom to the shoulder blades.    Diagnostic tests MRIAt C4-C5, posterior disc osteophyte complex contacts and mildly  flattens the ventral  cord without significant canal stenosis. Mild  left foraminal stenosis at this level. Findings are similar to the  prior.  2. At C5-C6, new small central disc protrusion contacts the ventral  cord without cord deformation or significant canal stenosis.  3. Normal cord signal without enhancement.THORACIC. Since the 2017 prior there is new mild (10-20%) height loss of  the T1 and T3 vertebral bodies, suggestive of mild compression  fractures. There is mild marrow edema along the right T3 superior  endplate and bilateral T3 pedicles. It is unclear if this represents  recent or unhealed fractures or degenerative/stress related change.  Recommend correlation with the presence or absence of point  tenderness and any history of recent trauma.  2. Degenerative disc height loss at T9-T10, similar to prior. No  evidence of significant canal stenosis or foraminal impingement.  3. Normal cord signal without enhancement.    Patient Stated Goals Pt would like to be able to walk without fear of falling.  and not holding onto walls,  would like to shop for groceries without dependence on cart.  play with son on floor.  do household chores without excruciating pain.  and  shop forgroceries without having to ride on motorized cart or hanging onto shopping cart    Currently in Pain? Yes    Pain Score 6     Pain Location Neck    Pain Orientation Right;Left    Pain Descriptors / Indicators Aching    Pain Type Chronic pain    Pain Onset More than a month ago    Pain Frequency Intermittent    Aggravating Factors  household chores such as sweeping, hard coming back up to stand. My home health aid does my laundry    Pain Relieving Factors unsure    Pain Score 8    Pain Location Back    Pain Orientation Right;Left    Pain Descriptors / Indicators Aching;Sore;Tightness    Pain Type Chronic pain    Pain Onset More than a month ago    Pain Frequency Intermittent    Aggravating Factors  standing / walking, moving around     Pain Relieving Factors unsure                [facility] PT Assessment - 08/10/21 0001       Assessment   Medical Diagnosis cervical spondylosis with radiculopathy    Referring Provider (PT) Coletta Memos MD    Onset Date/Surgical Date 11/20/20      Standardized Balance Assessment   Standardized Balance Assessment Berg Balance Test      Berg Balance Test   Sit to Stand Able to stand  independently using hands    Standing Unsupported Able to stand safely 2 minutes    Sitting with Back Unsupported but Feet Supported on Floor or Stool Able to sit safely and securely 2 minutes    Stand to Sit Controls descent by using hands    Transfers Able to transfer safely, definite need of hands    Standing Unsupported with Eyes Closed Able to stand 10 seconds with supervision    Standing Unsupported with Feet Together Able to place feet together independently and stand for 1 minute with supervision    From Standing, Reach Forward with Outstretched Arm Can reach forward >12 cm safely (5")    From Standing Position, Pick up Object from Floor Able to pick up shoe, needs supervision    From Standing Position, Turn to Look Behind Over each Shoulder Looks behind one side only/other side shows less weight shift    Turn 360 Degrees Able to turn 360 degrees safely one side only in 4 seconds or less    Standing Unsupported, Alternately Place Feet on Step/Stool Able to stand independently and complete 8 steps >20 seconds    Standing Unsupported, One Foot in Front Needs help to step but can hold 15 seconds    Standing on One Leg Tries to lift leg/unable to hold 3 seconds but remains standing independently    Total Score 40                            [facility] Adult PT Treatment/Exercise:  Therapeutic Exercise: Nu-step L5 x UE/LE Upper trap / levator scapulae stretch 1 x 30 sec Chin tuck 1 x 10 holding 5 seconds Demonstration and cues for proper form Scapular retraction 1 x 10   holding 5 sec Seated thoracic rotation with arms cross 1 x 10   Manual Therapy: Sub-occipital release MTPR along bil upper trap/ levator scapuale  Neuromuscular re-ed: N/A  Therapeutic Activity: N/A  Modalities: N/A  Self  Care: Log rolling getting out of bed to reduce torque on the back.   Consider / progression for next session:         PT Education - 08/10/21 1038     Education Details Reviewed and updated HEP to include HEP, discussed BERG balance assessment/ findings. proper form with log rolling to assist with getting out of bed and reduce stress on back.    Person(s) Educated Patient    Methods Explanation;Verbal cues    Comprehension Verbal cues required;Verbalized understanding              PT Short Term Goals - 08/10/21 1101       PT SHORT TERM GOAL #1   Title Independent with initial HEP    Period Weeks    Status Achieved      PT SHORT TERM GOAL #2   Title Pt will be able to perform 6 minute walk test.    Status Unable to assess      PT SHORT TERM GOAL #3   Title Pt will be able to perform sit to stand with proper form without using momentum and compensations    Status On-going      PT SHORT TERM GOAL #4   Title Pt will be offered opportunity for aquatic therapy to advance current program she is using on her own    Status Achieved               PT Long Term Goals - 08/10/21 1102       PT LONG TERM GOAL #1   Title Pt will be independent with advanced HEP given    Period Weeks    Status On-going      PT LONG TERM GOAL #2   Title Pt will be able to negotiate steps without exacerbation of pain greater than 2/10  and LRAD    Period Weeks    Status On-going      PT LONG TERM GOAL #3   Title Pt will increase neck AROM 10 degreees bil to increase abilty to turn neck for ambulation and visual scanning    Period Weeks    Status Unable to assess      PT LONG TERM GOAL #4   Title Pt will be able to perform standing to floor transfer  in order to play with kids and to ensure safety in home    Period Weeks    Status On-going      PT LONG TERM GOAL #5   Title PT with be able to walk/stand >/= 30-45 min with no AD with </= 2/10 pain for functional endurance and return to shopping/leisure activities    Period Weeks    Status On-going      Additional Long Term Goals   Additional Long Term Goals Yes      PT LONG TERM GOAL #6   Title Pt will incorporate regular land based exercise in order to maintain strength for ADL/household tasks    Period Weeks    Status On-going      PT LONG TERM GOAL #7   Title increase BERG balance  to >/= 50/56 to demo improvement in balance and safety    Period Weeks    Status New    Target Date 09/06/21                   Plan - 08/10/21 1043     Clinical Impression Statement Ms Tayag returns to her follow  up visit 4 weeks after her evaluation due to having to be admitted due to the hospital form a allergic reaction. She continues to report neck and back pain today. assessed her balance which she scored a 40/56 indicating higer fall risk and discussed importance of using a a SPC for safety. She responded well for neck stretching and general strengthening reporting reduced sitffness in the neck and the back following thoracic rotation.    Comorbidities L TKA 01-26-21  DDD cardiac cath 2009  , Sciatica, RTC syndrome, plantar fasciitis, carpal tunnel.  OA, obesity,  See medical History, ALLERGIC to Percocet, Hydrocodone,Vicodin, Aleve    Examination-Activity Limitations Squat;Stand;Stairs;Bend;Carry;Lift;Locomotion Level;Reach Overhead;Transfers    PT Treatment/Interventions ADLs/Self Care Home Management;Aquatic Therapy;Cryotherapy;Moist Heat;Balance training;Therapeutic exercise;Therapeutic activities;Functional mobility training;Stair training;Gait training;Neuromuscular re-education;Patient/family education;Passive range of motion;Manual techniques;Dry needling;Taping;Joint  Manipulations;Vestibular    PT Next Visit Plan review / update HEP, low back and basic neck exercise,    PT Home Exercise Plan [id number] - sit to stand, heel raise, chin tuck, scapular retraction, upper trap/ levator scapulae stretch, log roll    Consulted and Agree with Plan of Care Patient             Patient will benefit from skilled therapeutic intervention in order to improve the following deficits and impairments:  Pain, Obesity, Postural dysfunction, Improper body mechanics, Difficulty walking, Decreased mobility, Decreased endurance, Decreased range of motion, Decreased strength, Hypomobility, Increased muscle spasms, Impaired flexibility, Impaired UE functional use, Decreased activity tolerance, Decreased balance  Visit Diagnosis: Cervicalgia  Muscle weakness (generalized)  Unsteadiness on feet  Cramp and spasm     Problem List Patient Active Problem List   Diagnosis Date Noted   Allergic reaction caused by a drug 07/26/2021   Primary osteoarthritis of left knee 12/29/2019   Morbid obesity (HCC)    Bradycardia    Pulmonary embolism on long-term anticoagulation therapy (HCC) 04/06/2016   Pain in the chest    Chronic pain    Chest wall pain    Pulmonary embolism (HCC) 07/31/2013   Vocal cord dysfunction 02/28/2012   Dyspnea 01/17/2012   Chest pain 11/25/2011   Hypokalemia 11/25/2011   GERD (gastroesophageal reflux disease) 03/08/2011   DYSMENORRHEA 11/10/2010   PLANTAR FASCIITIS 09/14/2010   OSTEOARTHRITIS, KNEES, BILATERAL, 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 01/03/2007   Irritable bowel syndrome 01/03/2007   Lulu Riding PT, DPT, LAT, ATC  08/10/21  11:04  AM     [facility] Rehabilitation [facility] [address] [place], [place], [address] Phone: [phone]   Fax:  [phone]  Name: CECILEY BUIST MRN: [id number] Date of Birth: [birth year]

## 2021-08-11 DIAGNOSIS — Z96642 Presence of left artificial hip joint: Secondary | ICD-10-CM | POA: Diagnosis not present

## 2021-08-12 DIAGNOSIS — Z96642 Presence of left artificial hip joint: Secondary | ICD-10-CM | POA: Diagnosis not present

## 2021-08-13 DIAGNOSIS — Z96642 Presence of left artificial hip joint: Secondary | ICD-10-CM | POA: Diagnosis not present

## 2021-08-14 DIAGNOSIS — Z96642 Presence of left artificial hip joint: Secondary | ICD-10-CM | POA: Diagnosis not present

## 2021-08-15 ENCOUNTER — Encounter: Payer: Self-pay | Admitting: Physical Therapy

## 2021-08-15 ENCOUNTER — Telehealth: Payer: Self-pay | Admitting: Physical Therapy

## 2021-08-15 ENCOUNTER — Ambulatory Visit: Payer: Medicaid Other | Admitting: Physical Therapy

## 2021-08-15 DIAGNOSIS — Z96642 Presence of left artificial hip joint: Secondary | ICD-10-CM | POA: Diagnosis not present

## 2021-08-15 NOTE — Telephone Encounter (Signed)
Attempted calling regarding missed appointment today. No answer and unable to leave VM.  Agron Swiney PT, DPT, LAT, ATC  08/15/21  11:30 AM

## 2021-08-16 DIAGNOSIS — Z96642 Presence of left artificial hip joint: Secondary | ICD-10-CM | POA: Diagnosis not present

## 2021-08-17 ENCOUNTER — Ambulatory Visit: Payer: Medicaid Other

## 2021-08-17 ENCOUNTER — Other Ambulatory Visit: Payer: Self-pay

## 2021-08-17 DIAGNOSIS — M25562 Pain in left knee: Secondary | ICD-10-CM | POA: Diagnosis not present

## 2021-08-17 DIAGNOSIS — M6281 Muscle weakness (generalized): Secondary | ICD-10-CM | POA: Diagnosis not present

## 2021-08-17 DIAGNOSIS — R252 Cramp and spasm: Secondary | ICD-10-CM

## 2021-08-17 DIAGNOSIS — M25662 Stiffness of left knee, not elsewhere classified: Secondary | ICD-10-CM | POA: Diagnosis not present

## 2021-08-17 DIAGNOSIS — Z96642 Presence of left artificial hip joint: Secondary | ICD-10-CM | POA: Diagnosis not present

## 2021-08-17 DIAGNOSIS — R2681 Unsteadiness on feet: Secondary | ICD-10-CM | POA: Diagnosis not present

## 2021-08-17 DIAGNOSIS — R296 Repeated falls: Secondary | ICD-10-CM | POA: Diagnosis not present

## 2021-08-17 DIAGNOSIS — M542 Cervicalgia: Secondary | ICD-10-CM

## 2021-08-17 DIAGNOSIS — R262 Difficulty in walking, not elsewhere classified: Secondary | ICD-10-CM | POA: Diagnosis not present

## 2021-08-17 NOTE — Patient Instructions (Signed)

## 2021-08-17 NOTE — Therapy (Signed)
[facility] Outpatient Rehabilitation [facility] [address] [place], [place], [address] Phone: [phone]   Fax:  [phone]  Physical Therapy Treatment  Patient Details  Name: Katie [name] MRN: [id number] Date of Birth: [birth year] Referring Provider (PT): Coletta Memos MD   Encounter Date: 08/17/2021   PT End of Session - 08/17/21 1113     Visit Number 3    Number of Visits 16    Date for PT Re-Evaluation 09/06/21    Authorization Type healthy Blue  no TX, esitm , vaso or ionto    PT Start Time 1115   patient late   PT Stop Time 1143    PT Time Calculation (min) 28 min    Activity Tolerance Patient tolerated treatment well    Behavior During Therapy [facility] for tasks assessed/performed             Past Medical History:  Diagnosis Date   Anxiety    Back pain    Bipolar 1 disorder (HCC)    Chronic pain entered 08/15/2015   Treated with Oxycodone   Depression    Depression    Dyspnea    History of acute bronchitis    HTN (hypertension)    OA (osteoarthritis)    Obesity    PE (pulmonary embolism)    oct 2014   Sciatica     Past Surgical History:  Procedure Laterality Date   ADENOIDECTOMY     BIOPSY STOMACH     CARDIAC CATHETERIZATION  2009   normal; in [place]   CESAREAN SECTION  2002   x 2, 1997 and 2002   CHOLECYSTECTOMY  2007   KNEE SURGERY Right 2005   TONSILLECTOMY     TOTAL KNEE ARTHROPLASTY Left 01/27/2020   Procedure: TOTAL KNEE ARTHROPLASTY;  Surgeon: [name], MD;  Location: [facility];  Service: Orthopedics;  Laterality: Left;   TYMPANOSTOMY TUBE PLACEMENT      There were no vitals filed for this visit.   Subjective Assessment - 08/17/21 1115     Subjective Patient reports she is feeling "so/so" She is having some neck pressure and the pain is still radiating and still having some low back pain.    Diagnostic tests MRIAt C4-C5, posterior disc osteophyte complex contacts and mildly  flattens the ventral cord  without significant canal stenosis. Mild  left foraminal stenosis at this level. Findings are similar to the  prior.  2. At C5-C6, new small central disc protrusion contacts the ventral  cord without cord deformation or significant canal stenosis.  3. Normal cord signal without enhancement.THORACIC. Since the 2017 prior there is new mild (10-20%) height loss of  the T1 and T3 vertebral bodies, suggestive of mild compression  fractures. There is mild marrow edema along the right T3 superior  endplate and bilateral T3 pedicles. It is unclear if this represents  recent or unhealed fractures or degenerative/stress related change.  Recommend correlation with the presence or absence of point  tenderness and any history of recent trauma.  2. Degenerative disc height loss at T9-T10, similar to prior. No  evidence of significant canal stenosis or foraminal impingement.  3. Normal cord signal without enhancement.    Patient Stated Goals Pt would like to be able to walk without fear of falling.  and not holding onto walls,  would like to shop for groceries without dependence on cart.  play with son on floor.  do household chores without excruciating pain.  and shop forgroceries  without having to ride on motorized cart or hanging onto shopping cart    Currently in Pain? Yes    Pain Score 6     Pain Location Neck    Pain Orientation Posterior    Pain Descriptors / Indicators Radiating    Pain Type Chronic pain    Pain Onset More than a month ago    Pain Frequency Intermittent    Aggravating Factors  movement    Pain Relieving Factors unsure    Multiple Pain Sites Yes    Pain Score 7    Pain Location Back    Pain Orientation Lower    Pain Descriptors / Indicators Radiating    Pain Type Chronic pain    Pain Onset More than a month ago    Pain Frequency Intermittent    Aggravating Factors  standing/walking    Pain Relieving Factors sitting                   [facility] Adult PT Treatment/Exercise:    Therapeutic Exercise:   Supine pelvic tilt 2 x 10  Hip bridge 2 x 10  Hooklying resisted hip abduction 2 x 10 blue band  Sit to stand from raised height 1 x 10   Not performed today:  Nu-step L5 x UE/LE Upper trap / levator scapulae stretch 1 x 30 sec Chin tuck 1 x 10 holding 5 seconds Demonstration and cues for proper form Scapular retraction 1 x 10  holding 5 sec Seated thoracic rotation with arms cross 1 x 10    Manual Therapy:  Not performed today: Sub-occipital release MTPR along bil upper trap/ levator scapuale   Neuromuscular re-ed: N/A   Therapeutic Activity: N/A   Modalities: N/A   Self Care: Where to purchase SPC  Reviewed log roll providing handout with demonstration                         PT Education - 08/17/21 1128     Education Details See self care    Person(s) Educated Patient    Methods Explanation    Comprehension Verbalized understanding              PT Short Term Goals - 08/10/21 1101       PT SHORT TERM GOAL #1   Title Independent with initial HEP    Period Weeks    Status Achieved      PT SHORT TERM GOAL #2   Title Pt will be able to perform 6 minute walk test.    Status Unable to assess      PT SHORT TERM GOAL #3   Title Pt will be able to perform sit to stand with proper form without using momentum and compensations    Status On-going      PT SHORT TERM GOAL #4   Title Pt will be offered opportunity for aquatic therapy to advance current program she is using on her own    Status Achieved               PT Long Term Goals - 08/10/21 1102       PT LONG TERM GOAL #1   Title Pt will be independent with advanced HEP given    Period Weeks    Status On-going      PT LONG TERM GOAL #2   Title Pt will be able to negotiate steps without exacerbation of pain greater than 2/10  and LRAD    Period Weeks    Status On-going      PT LONG TERM GOAL #3   Title Pt will increase neck AROM 10  degreees bil to increase abilty to turn neck for ambulation and visual scanning    Period Weeks    Status Unable to assess      PT LONG TERM GOAL #4   Title Pt will be able to perform standing to floor transfer in order to play with kids and to ensure safety in home    Period Weeks    Status On-going      PT LONG TERM GOAL #5   Title PT with be able to walk/stand >/= 30-45 min with no AD with </= 2/10 pain for functional endurance and return to shopping/leisure activities    Period Weeks    Status On-going      Additional Long Term Goals   Additional Long Term Goals Yes      PT LONG TERM GOAL #6   Title Pt will incorporate regular land based exercise in order to maintain strength for ADL/household tasks    Period Weeks    Status On-going      PT LONG TERM GOAL #7   Title increase BERG balance  to >/= 50/56 to demo improvement in balance and safety    Period Weeks    Status New    Target Date 09/06/21                   Plan - 08/17/21 1120     Clinical Impression Statement Session was limited as patient was late for scheduled visit. She reports she has been unable to find her [facility], so was recommended on places to purchase. Session focused on initiating core and hip strengthening, which she tolerated well without reports of increased pain. She required consistent cues for breathing with ther ex as she has tendency to hold her breath when perform strengthening exercise. She is able to perform sit to stand from raised height with proper form without use of UE's and no reports of pain.    Comorbidities L TKA 01-26-21  DDD cardiac cath 2009  , Sciatica, RTC syndrome, plantar fasciitis, carpal tunnel.  OA, obesity,  See medical History, ALLERGIC to Percocet, Hydrocodone,Vicodin, Aleve    Examination-Activity Limitations Squat;Stand;Stairs;Bend;Carry;Lift;Locomotion Level;Reach Overhead;Transfers    PT Treatment/Interventions ADLs/Self Care Home Management;Aquatic  Therapy;Cryotherapy;Moist Heat;Balance training;Therapeutic exercise;Therapeutic activities;Functional mobility training;Stair training;Gait training;Neuromuscular re-education;Patient/family education;Passive range of motion;Manual techniques;Dry needling;Taping;Joint Manipulations;Vestibular    PT Next Visit Plan review / update HEP, low back and basic neck exercise,    PT Home Exercise Plan [id number] - sit to stand, heel raise, chin tuck, scapular retraction, upper trap/ levator scapulae stretch, log roll    Consulted and Agree with Plan of Care Patient             Patient will benefit from skilled therapeutic intervention in order to improve the following deficits and impairments:  Pain, Obesity, Postural dysfunction, Improper body mechanics, Difficulty walking, Decreased mobility, Decreased endurance, Decreased range of motion, Decreased strength, Hypomobility, Increased muscle spasms, Impaired flexibility, Impaired UE functional use, Decreased activity tolerance, Decreased balance  Visit Diagnosis: Cervicalgia  Muscle weakness (generalized)  Unsteadiness on feet  Cramp and spasm     Problem List Patient Active Problem List   Diagnosis Date Noted   Allergic reaction caused by a drug 07/26/2021   Primary osteoarthritis of left knee 12/29/2019   Morbid obesity (HCC)  Bradycardia    Pulmonary embolism on long-term anticoagulation therapy (HCC) 04/06/2016   Pain in the chest    Chronic pain    Chest wall pain    Pulmonary embolism (HCC) 07/31/2013   Vocal cord dysfunction 02/28/2012   Dyspnea 01/17/2012   Chest pain 11/25/2011   Hypokalemia 11/25/2011   GERD (gastroesophageal reflux disease) 03/08/2011   DYSMENORRHEA 11/10/2010   PLANTAR FASCIITIS 09/14/2010   OSTEOARTHRITIS, KNEES, BILATERAL, SEVERE 06/22/2010   ROTATOR CUFF SYNDROME, RIGHT 04/11/2010   VITAMIN D DEFICIENCY 02/18/2010   Bipolar 1 disorder (HCC) 01/17/2010   Essential hypertension, benign 01/17/2010    INSOMNIA 01/17/2010   OBESITY, NOS 01/03/2007   Depression 01/03/2007   HEARING LOSS NOS OR DEAFNESS 01/03/2007   EDEMA-LEGS,DUE TO VENOUS OBSTRUCT. 01/03/2007   Irritable bowel syndrome 01/03/2007   Letitia Libra, PT, DPT, ATC 08/17/21 11:45 AM   [facility] Outpatient Rehabilitation [facility] [address] [place], [place], [address] Phone: [phone]   Fax:  [phone]  Name: Katie [name] MRN: [id number] Date of Birth: 08-Oct-1966

## 2021-08-18 DIAGNOSIS — Z96642 Presence of left artificial hip joint: Secondary | ICD-10-CM | POA: Diagnosis not present

## 2021-08-19 DIAGNOSIS — Z96642 Presence of left artificial hip joint: Secondary | ICD-10-CM | POA: Diagnosis not present

## 2021-08-20 DIAGNOSIS — Z96642 Presence of left artificial hip joint: Secondary | ICD-10-CM | POA: Diagnosis not present

## 2021-08-21 DIAGNOSIS — Z96642 Presence of left artificial hip joint: Secondary | ICD-10-CM | POA: Diagnosis not present

## 2021-08-22 DIAGNOSIS — Z96642 Presence of left artificial hip joint: Secondary | ICD-10-CM | POA: Diagnosis not present

## 2021-08-23 ENCOUNTER — Other Ambulatory Visit: Payer: Self-pay

## 2021-08-23 ENCOUNTER — Emergency Department (HOSPITAL_COMMUNITY): Payer: Medicaid Other

## 2021-08-23 ENCOUNTER — Encounter (HOSPITAL_COMMUNITY): Payer: Self-pay

## 2021-08-23 ENCOUNTER — Emergency Department (HOSPITAL_COMMUNITY)
Admission: EM | Admit: 2021-08-23 | Discharge: 2021-08-24 | Disposition: A | Discharge status: Home / Self Care | Payer: Medicaid Other | Attending: Emergency Medicine | Admitting: Emergency Medicine

## 2021-08-23 DIAGNOSIS — Z7982 Long term (current) use of aspirin: Secondary | ICD-10-CM | POA: Diagnosis not present

## 2021-08-23 DIAGNOSIS — Z79899 Other long term (current) drug therapy: Secondary | ICD-10-CM | POA: Diagnosis not present

## 2021-08-23 DIAGNOSIS — H73892 Other specified disorders of tympanic membrane, left ear: Secondary | ICD-10-CM | POA: Diagnosis not present

## 2021-08-23 DIAGNOSIS — Z96652 Presence of left artificial knee joint: Secondary | ICD-10-CM | POA: Diagnosis not present

## 2021-08-23 DIAGNOSIS — W19XXXA Unspecified fall, initial encounter: Secondary | ICD-10-CM | POA: Insufficient documentation

## 2021-08-23 DIAGNOSIS — I10 Essential (primary) hypertension: Secondary | ICD-10-CM | POA: Insufficient documentation

## 2021-08-23 DIAGNOSIS — Z20822 Contact with and (suspected) exposure to covid-19: Secondary | ICD-10-CM | POA: Diagnosis not present

## 2021-08-23 DIAGNOSIS — R42 Dizziness and giddiness: Secondary | ICD-10-CM | POA: Insufficient documentation

## 2021-08-23 DIAGNOSIS — Z96642 Presence of left artificial hip joint: Secondary | ICD-10-CM | POA: Diagnosis not present

## 2021-08-23 DIAGNOSIS — H6592 Unspecified nonsuppurative otitis media, left ear: Secondary | ICD-10-CM

## 2021-08-23 NOTE — ED Provider Notes (Signed)
Emergency Medicine Provider Triage Evaluation Note  Katie [name] , a 55 y.o. female  was evaluated in triage.  Pt complains of dizziness and sinus congestion.  Has been on abx for sinus infection, but can't remember which one.  Now having difficulty "balancing for the past couple of days."   States that she fell a few days ago.  Reports some discharge from her ears.  Review of Systems  Positive: Congestion, dizziness Negative: Fever, chills  Physical Exam  BP 138/83 (BP Location: Right Arm)   Pulse 65   Temp 97.7 F (36.5 C) (Oral)   Resp 16   Ht 5\' 5"  (1.651 m)   Wt 104.3 kg   LMP 12/28/2015 (Exact Date)   SpO2 96%   BMI 38.27 kg/m  Gen:   Awake, no distress   Resp:  Normal effort  MSK:   Moves extremities without difficulty  Other:  TMs notable for mucoid effusions  Medical Decision Making  Medically screening exam initiated at 10:56 PM.  Appropriate orders placed.  12/30/2015 was informed that the remainder of the evaluation will be completed by another provider, this initial triage assessment does not replace that evaluation, and the importance of remaining in the ED until their evaluation is complete.    Dizziness, congestion  Likely 2/2 to sinusitis, but concerning that she has fallen due to the dizziness.  CT ordered for initial screening.   [name], PA-C 08/23/21 2259    08/25/21, MD 08/23/21 [phone]

## 2021-08-23 NOTE — ED Triage Notes (Signed)
Pt from home. Pt stated that her ears have been draining. Pt states that her equilibrium is off and is unable to hold a balance. Pt stated that it feels like she's talking underwater. Pt states the drainage from her ear has been a red color. Pt no complaining of nausea, vomiting, or fevers at this time.

## 2021-08-24 DIAGNOSIS — Z96642 Presence of left artificial hip joint: Secondary | ICD-10-CM | POA: Diagnosis not present

## 2021-08-24 LAB — RESP PANEL BY RT-PCR (FLU A&B, COVID) ARPGX2
Influenza A by PCR: NEGATIVE
Influenza B by PCR: NEGATIVE
SARS Coronavirus 2 by RT PCR: NEGATIVE

## 2021-08-24 LAB — CBC WITH DIFFERENTIAL/PLATELET
Abs Immature Granulocytes: 0.07 10*3/uL (ref 0.00–0.07)
Basophils Absolute: 0.1 10*3/uL (ref 0.0–0.1)
Basophils Relative: 1 %
Eosinophils Absolute: 0.1 10*3/uL (ref 0.0–0.5)
Eosinophils Relative: 1 %
HCT: 41 % (ref 36.0–46.0)
Hemoglobin: 13.3 g/dL (ref 12.0–15.0)
Immature Granulocytes: 1 %
Lymphocytes Relative: 39 %
Lymphs Abs: 3.8 10*3/uL (ref 0.7–4.0)
MCH: 31.8 pg (ref 26.0–34.0)
MCHC: 32.4 g/dL (ref 30.0–36.0)
MCV: 98.1 fL (ref 80.0–100.0)
Monocytes Absolute: 0.6 10*3/uL (ref 0.1–1.0)
Monocytes Relative: 6 %
Neutro Abs: 5.3 10*3/uL (ref 1.7–7.7)
Neutrophils Relative %: 52 %
Platelets: 339 10*3/uL (ref 150–400)
RBC: 4.18 MIL/uL (ref 3.87–5.11)
RDW: 12.7 % (ref 11.5–15.5)
WBC: 10 10*3/uL (ref 4.0–10.5)
nRBC: 0 % (ref 0.0–0.2)

## 2021-08-24 MED ORDER — AMOXICILLIN-POT CLAVULANATE 875-125 MG PO TABS: 1.0000 | ORAL_TABLET | Freq: Two times a day (BID) | ORAL | 0 refills | Status: AC

## 2021-08-24 MED ORDER — CIPROFLOXACIN-DEXAMETHASONE 0.3-0.1 % OT SUSP: 4.0000 [drp] | Freq: Two times a day (BID) | OTIC | 0 refills | Status: AC

## 2021-08-24 NOTE — Discharge Instructions (Addendum)
Take Augmentin and Ciprodex as discussed.  Call ENT clinic to schedule an appointment.  They would like to see you towards the end of the 2 weeks when you are almost finished with your antibiotics.  Take Tylenol and Motrin for pain control as needed.  Return to the emergency room if you develop fever, worsening pain in your ears or behind your ears, or recurrent falls.

## 2021-08-24 NOTE — ED Provider Notes (Signed)
[facility] EMERGENCY DEPARTMENT Provider Note   CSN: [id number] Arrival date & time: 08/23/21  2205     History Chief Complaint  Patient presents with   Dizziness   Fall    Katie [name] is a 55 y.o. female.  55 year old female presents today for evaluation of dizziness and left ear drainage of 9-day duration.  Patient reports she was seen by her primary care doctor 9 days ago and was prescribed an antibiotic.  She is unable to recall the name of the antibiotic.  She denies fever, chills, sinus congestion, or other URI symptoms.  She does report having a drowning sensation in her ear and left ear drainage.  Reports her drainage is primarily yellow.  Endorses having tympanostomy tubes as a child.  Denies recurrent ear infections recently.  Reports her balance issues are not new and she is working with physical therapy for this.  Starting antibiotic she has not had much relief.  She called her primary care doctor yesterday and has a follow-up scheduled for upcoming Friday.  The history is provided by the patient. No language interpreter was used.  Fall Pertinent negatives include no headaches.      Past Medical History:  Diagnosis Date   Anxiety    Back pain    Bipolar 1 disorder (HCC)    Chronic pain entered 08/15/2015   Treated with Oxycodone   Depression    Depression    Dyspnea    History of acute bronchitis    HTN (hypertension)    OA (osteoarthritis)    Obesity    PE (pulmonary embolism)    oct 2014   Sciatica     Patient Active Problem List   Diagnosis Date Noted   Allergic reaction caused by a drug 07/26/2021   Primary osteoarthritis of left knee 12/29/2019   Morbid obesity (HCC)    Bradycardia    Pulmonary embolism on long-term anticoagulation therapy (HCC) 04/06/2016   Pain in the chest    Chronic pain    Chest wall pain    Pulmonary embolism (HCC) 07/31/2013   Vocal cord dysfunction 02/28/2012   Dyspnea 01/17/2012   Chest pain  11/25/2011   Hypokalemia 11/25/2011   GERD (gastroesophageal reflux disease) 03/08/2011   DYSMENORRHEA 11/10/2010   PLANTAR FASCIITIS 09/14/2010   OSTEOARTHRITIS, KNEES, BILATERAL, 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 01/03/2007   Irritable bowel syndrome 01/03/2007    Past Surgical History:  Procedure Laterality Date   ADENOIDECTOMY     BIOPSY STOMACH     CARDIAC CATHETERIZATION  2009   normal; in [place]   CESAREAN SECTION  2002   x 2, 1997 and 2002   CHOLECYSTECTOMY  2007   KNEE SURGERY Right 2005   TONSILLECTOMY     TOTAL KNEE ARTHROPLASTY Left 01/27/2020   Procedure: TOTAL KNEE ARTHROPLASTY;  Surgeon: [name], MD;  Location: [facility];  Service: Orthopedics;  Laterality: Left;   TYMPANOSTOMY TUBE PLACEMENT       OB History   No obstetric history on file.     Family History  Problem Relation Age of Onset   Heart disease Mother    Diabetes Father    Asthma  Sister        also had rheumatic fever as a child, died age 37    Social History   Tobacco Use   Smoking status: Never   Smokeless tobacco: Never   Tobacco comments:    second hand smoke  Vaping Use   Vaping Use: Never used  Substance Use Topics   Alcohol use: Yes    Alcohol/week: 0.0 standard drinks    Comment: 2-3 x week has a glass of wine   Drug use: Yes    Frequency: 1.0 times per week    Types: Marijuana    Home Medications Prior to Admission medications   Medication Sig Start Date End Date Taking? Authorizing Provider  amoxicillin-clavulanate (AUGMENTIN) 875-125 MG tablet Take 1 tablet by mouth every 12 (twelve) hours for 14 days. 08/24/21 09/07/21 Yes Thaily Hackworth, PA-C   ciprofloxacin-dexamethasone (CIPRODEX) OTIC suspension Place 4 drops into the left ear 2 (two) times daily for 14 days. 08/24/21 09/07/21 Yes Karie Mainland, Keionna Kinnaird, PA-C  aspirin 81 MG chewable tablet Chew 81 mg by mouth daily as needed for mild pain.    [provider]  diazepam (VALIUM) 10 MG tablet Take 10 mg by mouth 2 (two) times daily. 06/25/21   [provider]  diphenhydrAMINE (BENADRYL) 25 MG tablet Take 1 tablet (25 mg total) by mouth every 6 (six) hours as needed for itching. 07/27/21   Burnadette Pop, MD  famotidine (PEPCID) 20 MG tablet Take 1 tablet (20 mg total) by mouth daily for 3 days. 07/27/21 07/30/21  Burnadette Pop, MD  furosemide (LASIX) 40 MG tablet Take 1 tablet (40 mg total) by mouth daily. 08/02/13   Zannie Cove, MD  gabapentin (NEURONTIN) 300 MG capsule Take 2 capsules (600 mg total) by mouth 3 (three) times daily. Patient taking differently: Take 600 mg by mouth daily. 05/01/21 07/26/21  Gerhard Munch, MD  meloxicam (MOBIC) 15 MG tablet Take 15 mg by mouth daily. 04/05/21   [provider]  omeprazole (PRILOSEC) 40 MG capsule Take 40 mg by mouth daily.    [provider]  phentermine (ADIPEX-P) 37.5 MG tablet Take 37.5 mg by mouth every morning.     [provider]  potassium chloride SA (K-DUR,KLOR-CON) 20 MEQ tablet Take 20 mEq by mouth 2 (two) times daily. 05/03/16   [provider]  Rivaroxaban (XARELTO) 20 MG TABS tablet Take 1 tablet (20 mg total) by mouth daily with supper. Starting 10/18, take 20mg  daily Patient taking differently: Take 20 mg by mouth daily with supper. 08/23/13   08/25/13, MD    Allergies    Contrast media [iodinated diagnostic agents], Percocet [oxycodone-acetaminophen], Toradol [ketorolac tromethamine], Tylenol [acetaminophen], and Vicodin [hydrocodone-acetaminophen]  Review of Systems   Review of Systems  Constitutional:  Negative for activity change, appetite change, chills and fever.   HENT:  Positive for ear discharge and ear pain. Negative for congestion and tinnitus.   Eyes:  Negative for visual disturbance.  Respiratory:  Negative for cough.   Gastrointestinal:  Negative for nausea.  Musculoskeletal:  Negative for neck stiffness.  Neurological:  Negative for weakness, light-headedness and headaches.  All other systems reviewed and are negative.  Physical Exam Updated Vital Signs BP (!) 144/85   Pulse (!) 55   Temp 97.7 F (36.5 C) (Oral)   Resp 16   Ht 5\' 5"  (1.651 m)   Wt 104.3 kg   LMP 12/28/2015 (Exact Date)   SpO2 98%   BMI 38.27 kg/m  Physical Exam Vitals and nursing note reviewed.  Constitutional:      General: She is not in acute distress.    Appearance: Normal appearance. She is not ill-appearing.  HENT:     Head: Normocephalic and atraumatic.     Right Ear: Hearing, tympanic membrane, ear canal and external ear normal. There is no impacted cerumen.     Left Ear: Hearing normal. Drainage present. There is mastoid tenderness.     Nose: Nose normal.     Mouth/Throat:     Mouth: Mucous membranes are moist.     Pharynx: No oropharyngeal exudate or posterior oropharyngeal erythema.  Eyes:     Extraocular Movements: Extraocular movements intact.     Conjunctiva/sclera: Conjunctivae normal.  Cardiovascular:     Rate and Rhythm: Normal rate and regular rhythm.  Pulmonary:     Effort: Pulmonary effort is normal. No respiratory distress.     Breath sounds: Normal breath sounds. No wheezing.  Musculoskeletal:        General: No deformity.  Skin:    Findings: No rash.  Neurological:     Mental Status: She is alert.    ED Results / Procedures / Treatments   Labs (all labs ordered are listed, but only abnormal results are displayed) Labs Reviewed  RESP PANEL BY RT-PCR (FLU A&B, COVID) ARPGX2  CBC WITH DIFFERENTIAL/PLATELET    EKG None  Radiology CT HEAD WO CONTRAST ( )  Result Date: 08/23/2021 CLINICAL DATA:  Dizziness. EXAM: CT  HEAD WITHOUT CONTRAST TECHNIQUE: Contiguous axial images were obtained from the base of the skull through the vertex without intravenous contrast. COMPARISON:  MRI brain 05/01/2021.  CT head 12/03/2020. FINDINGS: Brain: No evidence of acute infarction, hemorrhage, hydrocephalus, extra-axial collection or mass lesion/mass effect. Vascular: No hyperdense vessel or unexpected calcification. Skull: Normal. Negative for fracture or focal lesion. Sinuses/Orbits: There is opacification of mastoid air cells bilaterally, left greater than right. There is some fluid in the left middle ear. The visualized paranasal sinuses are clear. There is prominence of the adenoids. Orbits are within normal limits. Other: None. IMPRESSION: 1.  No acute intracranial abnormality. 2. Bilateral mastoid effusions, left greater than right. Fluid in the left middle ear cavity. 3. Prominence of the adenoids. Electronically Signed   By: Darliss Cheney M.D.   On: 08/23/2021 23:54    Procedures Procedures   Medications Ordered in ED Medications - No data to display  ED Course  I have reviewed the triage vital signs and the nursing notes.  Pertinent labs & imaging results that were available during my care of the patient were reviewed by me and considered in my medical decision making (see chart for details).    MDM Rules/Calculators/A&P                           Patient's respiratory panel was negative.  CBC unremarkable.  Case discussed with Dr. Annalee Genta with ENT and recommends Augmentin and Ciprodex for 2 weeks and follow-up in clinic.  Supportive care discussed with Motrin and Tylenol for pain control.  Patient to follow-up with primary care at the end of the week and ENT towards the end of the course of antibiotics.  Patient voices understanding and is in agreement with plan.  Final Clinical Impression(s) / ED Diagnoses Final diagnoses:  Dizziness  Fluid level behind tympanic membrane of left ear    Rx / DC Orders ED  Discharge Orders  Ordered    amoxicillin-clavulanate (AUGMENTIN) 875-125 MG tablet  Every 12 hours        08/24/21 0946    ciprofloxacin-dexamethasone (CIPRODEX) OTIC suspension  2 times daily        08/24/21 0946             Marita Kansas, PA-C 08/24/21 1027    Terald Sleeper, MD 08/24/21 1746

## 2021-08-25 DIAGNOSIS — Z96642 Presence of left artificial hip joint: Secondary | ICD-10-CM | POA: Diagnosis not present

## 2021-08-26 DIAGNOSIS — H73892 Other specified disorders of tympanic membrane, left ear: Secondary | ICD-10-CM | POA: Diagnosis not present

## 2021-08-26 DIAGNOSIS — R42 Dizziness and giddiness: Secondary | ICD-10-CM | POA: Diagnosis not present

## 2021-08-26 DIAGNOSIS — Z96642 Presence of left artificial hip joint: Secondary | ICD-10-CM | POA: Diagnosis not present

## 2021-08-27 DIAGNOSIS — Z96642 Presence of left artificial hip joint: Secondary | ICD-10-CM | POA: Diagnosis not present

## 2021-08-28 DIAGNOSIS — Z96642 Presence of left artificial hip joint: Secondary | ICD-10-CM | POA: Diagnosis not present

## 2021-08-29 DIAGNOSIS — Z96642 Presence of left artificial hip joint: Secondary | ICD-10-CM | POA: Diagnosis not present

## 2021-08-30 DIAGNOSIS — Z96642 Presence of left artificial hip joint: Secondary | ICD-10-CM | POA: Diagnosis not present

## 2021-08-31 ENCOUNTER — Encounter: Payer: Medicaid Other | Admitting: Physical Therapy

## 2021-08-31 DIAGNOSIS — Z96642 Presence of left artificial hip joint: Secondary | ICD-10-CM | POA: Diagnosis not present

## 2021-09-01 DIAGNOSIS — Z96642 Presence of left artificial hip joint: Secondary | ICD-10-CM | POA: Diagnosis not present

## 2021-09-02 DIAGNOSIS — Z96642 Presence of left artificial hip joint: Secondary | ICD-10-CM | POA: Diagnosis not present

## 2021-09-03 DIAGNOSIS — Z96642 Presence of left artificial hip joint: Secondary | ICD-10-CM | POA: Diagnosis not present

## 2021-09-04 DIAGNOSIS — Z96642 Presence of left artificial hip joint: Secondary | ICD-10-CM | POA: Diagnosis not present

## 2021-09-05 ENCOUNTER — Ambulatory Visit: Payer: Medicaid Other | Admitting: Physical Therapy

## 2021-09-05 ENCOUNTER — Other Ambulatory Visit: Payer: Self-pay

## 2021-09-05 ENCOUNTER — Encounter: Payer: Self-pay | Admitting: Physical Therapy

## 2021-09-05 DIAGNOSIS — R296 Repeated falls: Secondary | ICD-10-CM

## 2021-09-05 DIAGNOSIS — M6281 Muscle weakness (generalized): Secondary | ICD-10-CM

## 2021-09-05 DIAGNOSIS — R2681 Unsteadiness on feet: Secondary | ICD-10-CM | POA: Diagnosis not present

## 2021-09-05 DIAGNOSIS — R262 Difficulty in walking, not elsewhere classified: Secondary | ICD-10-CM | POA: Diagnosis not present

## 2021-09-05 DIAGNOSIS — R252 Cramp and spasm: Secondary | ICD-10-CM

## 2021-09-05 DIAGNOSIS — M25662 Stiffness of left knee, not elsewhere classified: Secondary | ICD-10-CM

## 2021-09-05 DIAGNOSIS — M25562 Pain in left knee: Secondary | ICD-10-CM

## 2021-09-05 DIAGNOSIS — M542 Cervicalgia: Secondary | ICD-10-CM | POA: Diagnosis not present

## 2021-09-05 DIAGNOSIS — Z96642 Presence of left artificial hip joint: Secondary | ICD-10-CM | POA: Diagnosis not present

## 2021-09-05 NOTE — Patient Instructions (Addendum)
Aquatic Therapy at [facility]-  What to Expect!  Schedule for Nov 9th  Where:   [facility] [address] [place], [place] [address] Rehab phone [phone]  NOTE:  You will receive an automated phone message reminding you of your appt and it will say the appointment is at the [address] [facility] clinic.          How to Prepare: Please make sure you drink 8 ounces of water about one hour prior to your pool session A caregiver may attend if needed with the patient to help assist as needed. A caregiver can sit in the pool room on chair. Please arrive IN YOUR SUIT and 15 minutes prior to your appointment - this helps to avoid delays in starting your session. Please make sure to attend to any toileting needs prior to entering the pool [place] rooms for changing are provided.   There is direct access to the pool deck form the locker room.  You can lock your belongings in a locker with lock provided. Once on the pool deck your therapist will ask if you have signed the Patient  Consent and Assignment of Benefits form before beginning treatment Your therapist may take your blood pressure prior to, during and after your session if indicated We usually try and create a home exercise program based on activities we do in the pool.  Please be thinking about who might be able to assist you in the pool should you need to participate in an aquatic home exercise program at the time of discharge if you need assistance.  Some patients do not want to or do not have the ability to participate in an aquatic home program - this is not a barrier in any way to you participating in aquatic therapy as part of your current therapy plan! After Discharge from PT, you can continue using home program at  the [facility], there is a drop-in fee for $5 ($45 a month)or for 60 years  or older $4.00 ($40 a month for seniors ) or any local [employer] pool.   Memberships for purchase are available for gym/pool at [facility]  IT IS VERY IMPORTANT THAT YOUR LAST VISIT BE IN THE CLINIC AT [facility] AFTER YOUR LAST AQUATIC VISIT.  PLEASE MAKE SURE THAT YOU HAVE A [address]  APPOINTMENT SCHEDULED.   About the pool: Pool is located approximately 500 FT from the entrance of the building.  Please bring a support person if you need assistance traveling this      distance.   Your therapist will assist you in entering the water; there are two ways to           enter: stairs with railings, and a mechanical lift. Your therapist will determine the most appropriate way for you.  Water temperature is usually between 88-90 degrees  There may be up to 2 other swimmers in the pool at the same time  The pool deck is tile, please wear shoes with good traction if you prefer not to be barefoot.    Contact Info:  For appointment scheduling and cancellations:         Please call the [facility]  PH:[phone]              Aquatic Therapy  Outpatient Rehabilitation @ [facility]       All sessions are 45 minutes  WALKING  Walking is a great form of exercise to increase your strength, endurance and overall fitness.  A walking program can help you start slowly and gradually build endurance as you go.  Everyone's ability is different, so each person's starting point will be different.  You do not have to follow them exactly.  The are just samples. You should simply find out what's right for you and stick to that program.   In the beginning, you'll start off walking 2-3 times a day for short distances.  As you get stronger, you'll be walking further at just 1-2 times per day. Bare minimum of exercise and steps is 7000 to10000 steps as shown on Health App on I phone  A. You Can Walk For A Certain Length Of Time Each Day    Walk 5 minutes 3 times per day.  Increase 2  minutes every 2 days (3 times per day).  Work up to 25-30 minutes (1-2 times per day).   Example:   Day 1-2 5 minutes 3 times per day   Day 7-8 12 minutes 2-3 times per day   Day 13-14 25 minutes 1-2 times per day  B. You Can Walk For a Certain Distance Each Day     Distance can be substituted for time.    Example:   3 trips to mailbox (at road)   3 trips to corner of block   3 trips around the block  C. Go to local high school and use the track.    Walk for distance ____ around track  Or time ____ minutes  D. Walk _x___ Jog ____ Run ___  Please only do the exercises that your therapist has initialed and dated  Garen Lah, PT, [place] Certified Exercise Expert for the Aging Adult  09/05/21 1:41 PM Phone: [phone] Fax: [phone]

## 2021-09-05 NOTE — Therapy (Signed)
[facility] [address] [place], [place], [address] Phone: [phone]   Fax:  [phone]  Physical Therapy Treatment/ERO  Patient Details  Name: Katie [name] MRN: [id number] Date of Birth: [birth year] Referring Provider (PT): Coletta Memos MD   Encounter Date: 09/05/2021   PT End of Session - 09/05/21 1336     Visit Number 4    Number of Visits 16    Date for PT Re-Evaluation 10/17/21    Authorization Type healthy Blue  no TX, esitm , vaso or ionto    PT Start Time 1330    PT Stop Time 1414    PT Time Calculation (min) 44 min    Activity Tolerance Patient limited by fatigue    Behavior During Therapy [facility] for tasks assessed/performed             Past Medical History:  Diagnosis Date   Anxiety    Back pain    Bipolar 1 disorder (HCC)    Chronic pain entered 08/15/2015   Treated with Oxycodone   Depression    Depression    Dyspnea    History of acute bronchitis    HTN (hypertension)    OA (osteoarthritis)    Obesity    PE (pulmonary embolism)    oct 2014   Sciatica     Past Surgical History:  Procedure Laterality Date   ADENOIDECTOMY     BIOPSY STOMACH     CARDIAC CATHETERIZATION  2009   normal; in [place]   CESAREAN SECTION  2002   x 2, 1997 and 2002   CHOLECYSTECTOMY  2007   KNEE SURGERY Right 2005   TONSILLECTOMY     TOTAL KNEE ARTHROPLASTY Left 01/27/2020   Procedure: TOTAL KNEE ARTHROPLASTY;  Surgeon: [name], MD;  Location: [facility];  Service: Orthopedics;  Laterality: Left;   TYMPANOSTOMY TUBE PLACEMENT      There were no vitals filed for this visit.   Subjective Assessment - 09/05/21 1356     Subjective Pt reports pain about 9/10 now.  she has not been able to do the exercises as well as before.  she would like to try aquatics to unload joints and    Pertinent History L TKA 01-26-21  DDD cardiac cath 2009  , Sciatica, RTC syndrome, plantar fasciitis, carpal tunnel.  OA,  obesity,  See medical History, ALLERGIC to Percocet, Hydrocodone,Vicodin, Aleve    How long can you sit comfortably? Sitting 2 minutes before I have to shifts    How long can you stand comfortably? 5 minutes    How long can you walk comfortably? I must shop with a shopping cart 15 minutes or I ride on a cart.    Diagnostic tests MRIAt C4-C5, posterior disc osteophyte complex contacts and mildly  flattens the ventral cord without significant canal stenosis. Mild  left foraminal stenosis at this level. Findings are similar to the  prior.  2. At C5-C6, new small central disc protrusion contacts the ventral  cord without cord deformation or significant canal stenosis.  3. Normal cord signal without enhancement.THORACIC. Since the 2017 prior there is new mild (10-20%) height loss of  the T1 and T3 vertebral bodies, suggestive of mild compression  fractures. There is mild marrow edema along the right T3 superior  endplate and bilateral T3 pedicles. It is unclear if this represents  recent or unhealed fractures or degenerative/stress related change.  Recommend correlation with the presence or absence of  point  tenderness and any history of recent trauma.  2. Degenerative disc height loss at T9-T10, similar to prior. No  evidence of significant canal stenosis or foraminal impingement.  3. Normal cord signal without enhancement.    Patient Stated Goals Pt would like to be able to walk without fear of falling.  and not holding onto walls,  would like to shop for groceries without dependence on cart.  play with son on floor.  do household chores without excruciating pain.  and shop forgroceries without having to ride on motorized cart or hanging onto shopping cart    Currently in Pain? Yes    Pain Score 9     Pain Location Generalized    Pain Descriptors / Indicators Aching;Spasm    Pain Radiating Towards My shoulders are better but my right back spasms and goes all over my body    Pain Onset More than a month ago     Pain Frequency Intermittent    Aggravating Factors  any movement    Pain Score 9    Pain Location Back    Pain Orientation Lower    Pain Descriptors / Indicators Radiating    Pain Type Chronic pain    Pain Onset More than a month ago    Pain Frequency Intermittent            [facility] Adult PT Treatment/Exercise:   Therapeutic Exercise:     Supine pelvic tilt 2 x 10  Hip bridge 2 x 10  Hooklying resisted hip abduction 2 x 10 blue band     Not performed today:   Upper trap / levator scapulae stretch 1 x 30 sec Chin tuck 1 x 10 holding 5 seconds Demonstration and cues for proper form Scapular retraction 1 x 10  holding 5 sec Seated thoracic rotation with arms cross 1 x 10   5.  Sit to stand from raised height 1 x 10  Manual Therapy:   Not performed today: N/A   Neuromuscular re-ed: N/A   Therapeutic Activity: N/A   Modalities: N/A   Self Care:  N/A    [facility] PT Assessment - 09/05/21 0001       Assessment   Medical Diagnosis cervical spondylosis with radiculopathy    Referring Provider (PT) Coletta Memos MD    Onset Date/Surgical Date 11/20/20      Functional Tests   Functional tests Sit to Stand;Floor to Stand      Single Leg Stance   Comments R 5 sec L 7sec      Sit to Stand   Comments 5 x STS 24.12 sec      Floor to Stand   Comments Pt with heavy use of UE and mod A from PT for transfers from floor to stand      Posture/Postural Control   Posture/Postural Control Postural limitations    Postural Limitations Rounded Shoulders;Forward head    Posture Comments pt has had +90 lb wt loss in last 3 years intentional wt loss      AROM   Overall AROM  Deficits    Overall AROM Comments shoulders WFL    Cervical Flexion 42    Cervical Extension 50    Cervical - Right Side Bend 30    Cervical - Left Side Bend 38    Cervical - Right Rotation 55    Cervical - Left Rotation 45    Lumbar Flexion 25% limited reaching just above toes    Lumbar  Extension 50 % limited    Lumbar - Right Side Bend 50% finger tip to knee joint line    Lumbar - Left Side Bend 50% finger tip to knee jt kine    Lumbar - Right Rotation 75% limited    Lumbar - Left Rotation 75% limited      Strength   Overall Strength Deficits    Right Shoulder Flexion 4+/5    Right Shoulder Extension 4+/5    Right Shoulder ABduction 4/5    Left Shoulder Flexion 4+/5    Left Shoulder Extension 4/5    Left Shoulder ABduction 4/5    Right Hand Grip (lbs) 42    Left Hand Grip (lbs) 53.0   60.60 58   Right Hip Flexion 4-/5    Right Hip Extension 4-/5    Right Hip ABduction 3/5    Left Hip Flexion 4-/5    Left Hip Extension 4-/5    Left Hip ABduction 3+/5    Right Knee Flexion 4/5    Right Knee Extension 4/5    Left Knee Flexion 4+/5    Left Knee Extension 5/5    Right Ankle Dorsiflexion 4-/5    Right Ankle Plantar Flexion 4-/5    Left Ankle Dorsiflexion 4+/5    Left Ankle Plantar Flexion 4/5      Palpation   Palpation comment TTP over R scalens and post cervicals thoracic paraspinals R T-2 to T 6,  R QL and R buttock tenderness                           [facility] Adult PT Treatment/Exercise - 09/05/21 0001       Ambulation/Gait   Ambulation/Gait Yes    Assistive device None    Gait Pattern Antalgic;Lateral trunk lean to left;Decreased trunk rotation;Decreased stance time - right;Decreased stride length    Ambulation Surface Level    Gait velocity --    Stairs Yes    Stairs Assistance 5: Supervision    Stairs Assistance Details (indicate cue type and reason) cuing for correct sequencing    Stair Management Technique One rail Left;Step to pattern    Number of Stairs 12   4 steps up and down 3 x with cane   Pre-Gait Activities 6 min walk test 739 ft    Gait Comments utilizing cane adjusted in R hand                     PT Education - 09/05/21 1720     Education Details See Self care    Methods  Explanation;Demonstration;Tactile cues;Verbal cues;Handout    Comprehension Verbalized understanding;Need further instruction;Returned demonstration              PT Short Term Goals - 09/05/21 1352       PT SHORT TERM GOAL #1   Title Independent with initial HEP since hospitalization    Baseline I try to do my initial exercises but I have not done as well since I was in the hospital    Time 3    Period Weeks    Status On-going    Target Date 09/26/21      PT SHORT TERM GOAL #2   Title Pt will be able to perform 6 minute walk test with 1809 feet    Baseline 726feet with cane    Time 3    Period Weeks    Status On-going  Target Date 09/26/21      PT SHORT TERM GOAL #3   Title Pt will be able to perform sit to stand with proper form without using momentum and compensations    Baseline 5 x STS 68.9 sec with compensations  09-05-21 24.12    Time 3    Period Weeks    Status Achieved    Target Date 09/26/21      PT SHORT TERM GOAL #4   Title Pt will be offered opportunity for aquatic therapy to advance current program she is using on her own    Baseline Pt scheduled for Aquatic therapy 09-05-21 for next 4 weeks    Time 3    Period Weeks    Status On-going    Target Date 09/26/21               PT Long Term Goals - 09/05/21 1354       PT LONG TERM GOAL #1   Title Pt will be independent with advanced HEP given    Baseline pt with generalized weakness continuing since hospitalization    Time 6    Period Weeks    Status On-going    Target Date 10/17/21      PT LONG TERM GOAL #2   Title Pt will be able to negotiate steps without exacerbation of pain greater than 2/10  and LRAD    Baseline Pt able to use cane with step to technique and handrail for safety    Time 6    Period Weeks    Status On-going    Target Date 10/17/21      PT LONG TERM GOAL #3   Title Pt will increase neck AROM 10 degreees bil to increase abilty to turn neck for ambulation and  visual scanning    Baseline AROM R rot 39, L rot 35   R rot to 55, Lrotation to 45    Time 6    Period Weeks    Status Achieved    Target Date 09/06/21      PT LONG TERM GOAL #4   Title Pt will be able to perform standing to floor transfer in order to play with kids and to ensure safety in home    Baseline Pt is fearful that she would fall on floor and not get back up.  5 X STS is 24.12 sec    Time 6    Period Weeks    Status On-going    Target Date 10/17/21      PT LONG TERM GOAL #5   Title PT with be able to walk/stand >/= 30 min with no AD with </= 3/10 pain for functional endurance and return to shopping/leisure activities    Baseline Must shop holding onto shopping cart and/or riding in a shopping motorized cart.  one year ago for TKA was able to walk without AD    Time 6    Period Weeks    Status Revised    Target Date 10/17/21      PT LONG TERM GOAL #6   Title Pt will incorporate regular land based exercise in order to maintain strength for ADL/household tasks    Baseline Pt unable to do any regular land based exercise since hospitalization    Time 6    Period Weeks    Status On-going    Target Date 10/17/21      PT LONG TERM GOAL #7   Title increase BERG balance  to >/= 50/56 to demo improvement in balance and safety    Baseline unable to test    Time 6    Period Weeks    Status On-going    Target Date 10/17/21                   Plan - 09/05/21 1620     Clinical Impression Statement Katie [name] returns to clinic ( former L TKA pt) after ED visit recently  for dizziness and fluid on left ear.  she actually has cane to use and cane adjusted to gait train.   CC of R sided pain from cervical/shoulder , thoracic and low back side pain.  Pt with myelopathy and inconsistent attendance but is at risk of falls.  She has shown improvement in 5 X sts to 24.12 from 68 sec at eval.   Mild foraminal stenoiss at C-4/5.   Pt does state she is concerned about balance and falls  and has fallen 3 times in past year while walking down hall in mid day but she does feel like she is stronger and 5 x STS does show improvement. Katie [name] has weakness and fearful of falling and would benefit from aquatic therapy to maximize strength in unweighted environment.  Pt presents with R Hand  ( dominnant hand ) weaker than left. but weaker than original eval.  Pt is unsteady on feet and expresses fear of falling but does appear more steady with use of cane.  Pt would benefit from extension to maximize function. from skilled PT to address weakness, pain, decreased cervical and lumbar AROM and increase ablity to rise from standing. Pt is a fall risk based on 5 xSTS  and LE weakness and would benefit from fall preparendness training and reinforcement of exercise and self managment of pain    Personal Factors and Comorbidities Comorbidity 1;Comorbidity 2    Comorbidities L TKA 01-26-21  DDD cardiac cath 2009  , Sciatica, RTC syndrome, plantar fasciitis, carpal tunnel.  OA, obesity,  See medical History, ALLERGIC to Percocet, Hydrocodone,Vicodin, Aleve    Examination-Activity Limitations Squat;Stand;Stairs;Bend;Carry;Lift;Locomotion Level;Reach Overhead;Transfers    Examination-Participation Restrictions Meal Prep    Clinical Decision Making Moderate    Rehab Potential Good    PT Frequency 2x / week    PT Duration 6 weeks    PT Treatment/Interventions ADLs/Self Care Home Management;Aquatic Therapy;Cryotherapy;Moist Heat;Balance training;Therapeutic exercise;Therapeutic activities;Functional mobility training;Stair training;Gait training;Neuromuscular re-education;Patient/family education;Passive range of motion;Manual techniques;Dry needling;Taping;Joint Manipulations;Vestibular    PT Next Visit Plan aquatics and reinforcement of exercise for strength  Redo BERG balance    PT Home Exercise Plan [id number] - sit to stand, heel raise, chin tuck, scapular retraction, upper trap/ levator scapulae stretch,  log roll    Consulted and Agree with Plan of Care Patient             Patient will benefit from skilled therapeutic intervention in order to improve the following deficits and impairments:  Pain, Obesity, Postural dysfunction, Improper body mechanics, Difficulty walking, Decreased mobility, Decreased endurance, Decreased range of motion, Decreased strength, Hypomobility, Increased muscle spasms, Impaired flexibility, Impaired UE functional use, Decreased activity tolerance, Decreased balance  Visit Diagnosis: Cervicalgia  Muscle weakness (generalized)  Unsteadiness on feet  Cramp and spasm  Repeated falls  Acute pain of left knee  Stiffness of left knee, not elsewhere classified  Difficulty in walking, not elsewhere classified     Problem List Patient Active Problem List   Diagnosis Date Noted  Allergic reaction caused by a drug 07/26/2021   Primary osteoarthritis of left knee 12/29/2019   Morbid obesity (HCC)    Bradycardia    Pulmonary embolism on long-term anticoagulation therapy (HCC) 04/06/2016   Pain in the chest    Chronic pain    Chest wall pain    Pulmonary embolism (HCC) 07/31/2013   Vocal cord dysfunction 02/28/2012   Dyspnea 01/17/2012   Chest pain 11/25/2011   Hypokalemia 11/25/2011   GERD (gastroesophageal reflux disease) 03/08/2011   DYSMENORRHEA 11/10/2010   PLANTAR FASCIITIS 09/14/2010   OSTEOARTHRITIS, KNEES, BILATERAL, 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 01/03/2007   Irritable bowel syndrome 01/03/2007    Garen Lah, PT, ATRIC Certified Exercise Expert for the Aging Adult  09/05/21 5:30 PM Phone: [phone] Fax: [phone]   [facility] [address] [place], [place], [address] Phone: [phone]   Fax:  [phone]  Name: ONIYAH HERTZBERG MRN: [id number] Date of Birth: 08-29-1966 Check all possible CPT codes: 97110- Therapeutic Exercise, [phone]- Neuro Re-education, [phone] - Gait Training, [phone] - Manual Therapy, [phone] - Therapeutic Activities, [phone] - Self Care, and [phone] - Physical performance training

## 2021-09-06 DIAGNOSIS — Z96642 Presence of left artificial hip joint: Secondary | ICD-10-CM | POA: Diagnosis not present

## 2021-09-07 DIAGNOSIS — Z96642 Presence of left artificial hip joint: Secondary | ICD-10-CM | POA: Diagnosis not present

## 2021-09-08 DIAGNOSIS — Z96642 Presence of left artificial hip joint: Secondary | ICD-10-CM | POA: Diagnosis not present

## 2021-09-09 DIAGNOSIS — Z96642 Presence of left artificial hip joint: Secondary | ICD-10-CM | POA: Diagnosis not present

## 2021-09-10 DIAGNOSIS — Z96642 Presence of left artificial hip joint: Secondary | ICD-10-CM | POA: Diagnosis not present

## 2021-09-11 DIAGNOSIS — Z96642 Presence of left artificial hip joint: Secondary | ICD-10-CM | POA: Diagnosis not present

## 2021-09-12 DIAGNOSIS — Z96642 Presence of left artificial hip joint: Secondary | ICD-10-CM | POA: Diagnosis not present

## 2021-09-13 DIAGNOSIS — Z96642 Presence of left artificial hip joint: Secondary | ICD-10-CM | POA: Diagnosis not present

## 2021-09-14 ENCOUNTER — Other Ambulatory Visit: Payer: Self-pay

## 2021-09-14 ENCOUNTER — Ambulatory Visit: Payer: Medicaid Other | Attending: Neurosurgery | Admitting: Physical Therapy

## 2021-09-14 ENCOUNTER — Encounter: Payer: Self-pay | Admitting: Physical Therapy

## 2021-09-14 DIAGNOSIS — R252 Cramp and spasm: Secondary | ICD-10-CM | POA: Diagnosis present

## 2021-09-14 DIAGNOSIS — M25662 Stiffness of left knee, not elsewhere classified: Secondary | ICD-10-CM | POA: Diagnosis present

## 2021-09-14 DIAGNOSIS — R2681 Unsteadiness on feet: Secondary | ICD-10-CM | POA: Insufficient documentation

## 2021-09-14 DIAGNOSIS — M542 Cervicalgia: Secondary | ICD-10-CM | POA: Insufficient documentation

## 2021-09-14 DIAGNOSIS — R296 Repeated falls: Secondary | ICD-10-CM | POA: Diagnosis present

## 2021-09-14 DIAGNOSIS — R262 Difficulty in walking, not elsewhere classified: Secondary | ICD-10-CM | POA: Insufficient documentation

## 2021-09-14 DIAGNOSIS — M25562 Pain in left knee: Secondary | ICD-10-CM | POA: Insufficient documentation

## 2021-09-14 DIAGNOSIS — M6281 Muscle weakness (generalized): Secondary | ICD-10-CM | POA: Diagnosis present

## 2021-09-14 DIAGNOSIS — Z96642 Presence of left artificial hip joint: Secondary | ICD-10-CM | POA: Diagnosis not present

## 2021-09-14 NOTE — Therapy (Signed)
[facility] [address] [place], [place], [address] Phone: [phone]   Fax:  [phone]  Physical Therapy Treatment  Patient Details  Name: Katie [name] MRN: [id number] Date of Birth: 06/15/1966 Referring Provider (PT): Coletta Memos MD   Encounter Date: 09/14/2021   PT End of Session - 09/14/21 1425     Visit Number 5    Number of Visits 16    Date for PT Re-Evaluation 10/17/21    Authorization Type healthy Blue  no TX, esitm , vaso or ionto    PT Start Time 1420    PT Stop Time 1515    PT Time Calculation (min) 55 min    Activity Tolerance Patient tolerated treatment well    Behavior During Therapy [facility] for tasks assessed/performed             Past Medical History:  Diagnosis Date   Anxiety    Back pain    Bipolar 1 disorder (HCC)    Chronic pain entered 08/15/2015   Treated with Oxycodone   Depression    Depression    Dyspnea    History of acute bronchitis    HTN (hypertension)    OA (osteoarthritis)    Obesity    PE (pulmonary embolism)    oct 2014   Sciatica     Past Surgical History:  Procedure Laterality Date   ADENOIDECTOMY     BIOPSY STOMACH     CARDIAC CATHETERIZATION  2009   normal; in [place]   CESAREAN SECTION  2002   x 2, 1997 and 2002   CHOLECYSTECTOMY  2007   KNEE SURGERY Right 2005   TONSILLECTOMY     TOTAL KNEE ARTHROPLASTY Left 01/27/2020   Procedure: TOTAL KNEE ARTHROPLASTY;  Surgeon: [name], MD;  Location: [facility];  Service: Orthopedics;  Laterality: Left;   TYMPANOSTOMY TUBE PLACEMENT      There were no vitals filed for this visit.   Subjective Assessment - 09/14/21 1426     Subjective Pt reports 4/10 generally.   Pt fell from my toilet seat chair sunday night in bathroom onto floor and son/ husband helped her up from floor.  the screw that held the leg on Toilet chair unscrewed and made unstable.    Pertinent History L TKA 01-26-21  DDD cardiac cath 2009   , Sciatica, RTC syndrome, plantar fasciitis, carpal tunnel.  OA, obesity,  See medical History, ALLERGIC to Percocet, Hydrocodone,Vicodin, Aleve    Diagnostic tests MRIAt C4-C5, posterior disc osteophyte complex contacts and mildly  flattens the ventral cord without significant canal stenosis. Mild  left foraminal stenosis at this level. Findings are similar to the  prior.  2. At C5-C6, new small central disc protrusion contacts the ventral  cord without cord deformation or significant canal stenosis.  3. Normal cord signal without enhancement.THORACIC. Since the 2017 prior there is new mild (10-20%) height loss of  the T1 and T3 vertebral bodies, suggestive of mild compression  fractures. There is mild marrow edema along the right T3 superior  endplate and bilateral T3 pedicles. It is unclear if this represents  recent or unhealed fractures or degenerative/stress related change.  Recommend correlation with the presence or absence of point  tenderness and any history of recent trauma.  2. Degenerative disc height loss at T9-T10, similar to prior. No  evidence of significant canal stenosis or foraminal impingement.  3. Normal cord signal without enhancement.    Patient Stated  Goals Pt would like to be able to walk without fear of falling.  and not holding onto walls,  would like to shop for groceries without dependence on cart.  play with son on floor.  do household chores without excruciating pain.  and shop forgroceries without having to ride on motorized cart or hanging onto shopping cart    Currently in Pain? Yes    Pain Score 4     Pain Location Generalized    Pain Orientation Right   R> L   Pain Descriptors / Indicators Aching    Pain Type Chronic pain    Pain Onset More than a month ago             Aquatic therapy at [facility]- [address] - therapeutic pool temp 95  degrees Pt enters building with cane  Treatment took place in water 3.8 to  4 ft 8 in.feet deep depending upon  activity.  Pt entered and exited the pool via stair and handrails independently.     [name] entered water for aquatic therapy for first time  at [employer] and therapeutic effects of water as she ambulated and acclimated to pool.  Pt acclimated to pool  and was able to ambulate across pool 15 ft x 6, then side steps x 6 and then ambulates backward x 6.    She was able to use aquatic barbells while ambulating to increase abdominal engagement with exercises.  Pt educated on neutral posture and hip hinging in seated position with water at chest level x 10 with stretch to low back and then x 10 with back at pool wall at external cue, VC for neck tucked to prevent hyperextension.   Runners stretch  2 x  30 sec on Left and then moves into hamstring stretch  Then runners stretch on R 2 x 30 sec  and then move into hamstring stretch . TC to insure proper technique. Figure 4 squat stretch with UE support for R and L x 60 sec each   Bil ankle heel raise on step     On edge of pool with bil UE support  Pt performed LE exercise Hip abd/add R/L 20 x each and then using 1 UE support Hip ext/flex with knee straight x 20, pt needing VC and TC for correct execution and sequencing Marching knee/hip 90/90 x 10 and then with added pool noodle for increased resistance x 10 each leg ham curl R/L x 20.  Squats x 20 reps with intermittent UE support x 2 sets. Lunge to target x 10 on R and the x 10 on LE Triangle pose with noodle Cat camel with noodle Use of pool noodle with trunk rotation to L and to R Jumping jacks  Balance work SLS with waist circles by pt to cause perturbations and challenge in water.  Pt working on balance in water by working on Tandem walking on pool noodle to add challenge to balance.    Toe yoga  Supine abdominal hollowing with VC to keep hips up and feet up with barbells in UE submerged as much as possible in 3 minutes  Pt fatigued and rested.  Worked on  prone aquatic barbell submerged for abdominal engagement Combo prone to supine with legs extended for 3 minutes to encourage active abdominal engagement.       Bad Ragaz, Pt able to float with yellow pool noodle under shoulders .  Pt assisted into supine floating position by  lying head on shoulder of PT to get into floating position. . PT at torso and assisting with trunk left to right and vice versa to engage trunk muscles. PT then rotated trunk in order to engage abdomnal (internal and external obliques)  Emphasis on breathing techniques to draw in abdominals for support.  Pt then utilizing posterior chain and engaging Hip extension and knee flexion with water resistance while PT used Aquastretch techniques to decrease muscle tension in low back on Bil gluteals and Bil low back.  Pt with initial incoordination to extend hip but with VC and TC was able to engage by end of session       Pt requires the buoyancy of water for active assisted exercises with buoyancy supported for strengthening and AROM exercises: PT  requires the viscosity of the water for resistance with strengthening exercises as well as balance Hydrostatic pressure also supports joints by unweighting joint load by at least 50 % in 3-4 feet depth water. 80% in chest to neck deep water. Water will allow for  reduced joint loading through buoyancy to help patient improve posture without excess stress and pain.                           PT Education - 09/14/21 1537     Education Details Aquatic principles and therapeutic benefit. intial exercise in pool since decline    Person(s) Educated Patient    Methods Explanation;Demonstration;Tactile cues;Verbal cues    Comprehension Verbalized understanding;Returned demonstration              PT Short Term Goals - 09/05/21 1352       PT SHORT TERM GOAL #1   Title Independent with initial HEP since hospitalization    Baseline I try to do my initial exercises  but I have not done as well since I was in the hospital    Time 3    Period Weeks    Status On-going    Target Date 09/26/21      PT SHORT TERM GOAL #2   Title Pt will be able to perform 6 minute walk test with 1809 feet    Baseline 750feet with cane    Time 3    Period Weeks    Status On-going    Target Date 09/26/21      PT SHORT TERM GOAL #3   Title Pt will be able to perform sit to stand with proper form without using momentum and compensations    Baseline 5 x STS 68.9 sec with compensations  09-05-21 24.12    Time 3    Period Weeks    Status Achieved    Target Date 09/26/21      PT SHORT TERM GOAL #4   Title Pt will be offered opportunity for aquatic therapy to advance current program she is using on her own    Baseline Pt scheduled for Aquatic therapy 09-05-21 for next 4 weeks    Time 3    Period Weeks    Status On-going    Target Date 09/26/21               PT Long Term Goals - 09/05/21 1354       PT LONG TERM GOAL #1   Title Pt will be independent with advanced HEP given    Baseline pt with generalized weakness continuing since hospitalization    Time 6    Period Weeks  Status On-going    Target Date 10/17/21      PT LONG TERM GOAL #2   Title Pt will be able to negotiate steps without exacerbation of pain greater than 2/10  and LRAD    Baseline Pt able to use cane with step to technique and handrail for safety    Time 6    Period Weeks    Status On-going    Target Date 10/17/21      PT LONG TERM GOAL #3   Title Pt will increase neck AROM 10 degreees bil to increase abilty to turn neck for ambulation and visual scanning    Baseline AROM R rot 39, L rot 35   R rot to 55, Lrotation to 45    Time 6    Period Weeks    Status Achieved    Target Date 09/06/21      PT LONG TERM GOAL #4   Title Pt will be able to perform standing to floor transfer in order to play with kids and to ensure safety in home    Baseline Pt is fearful that she would  fall on floor and not get back up.  5 X STS is 24.12 sec    Time 6    Period Weeks    Status On-going    Target Date 10/17/21      PT LONG TERM GOAL #5   Title PT with be able to walk/stand >/= 30 min with no AD with </= 3/10 pain for functional endurance and return to shopping/leisure activities    Baseline Must shop holding onto shopping cart and/or riding in a shopping motorized cart.  one year ago for TKA was able to walk without AD    Time 6    Period Weeks    Status Revised    Target Date 10/17/21      PT LONG TERM GOAL #6   Title Pt will incorporate regular land based exercise in order to maintain strength for ADL/household tasks    Baseline Pt unable to do any regular land based exercise since hospitalization    Time 6    Period Weeks    Status On-going    Target Date 10/17/21      PT LONG TERM GOAL #7   Title increase BERG balance  to >/= 50/56 to demo improvement in balance and safety    Baseline unable to test    Time 6    Period Weeks    Status On-going    Target Date 10/17/21                   Plan - 09/14/21 1426     Clinical Impression Statement Ms Mccubbin enters clinic at [facility] for first time wth 4/10 pain and reports that she fell in bathroom when commode seat became loose because of missing screw.  Pt was able to participate in 55 minutes of aquatics and reduced pain 3/10.  Pt introduced to therapeutic benefits of water. and plans to return to [facility] in coming weeks  Pt will recieve HEP to continue strengthening in area in which she is not fearful of falling.  Pt requires the buoyancy of water for active assisted exercises with buoyancy supported for strengthening and AROM exercises: PT  requires the viscosity of the water for resistance with strengthening exercises as well as balance  Hydrostatic pressure also supports joints by unweighting joint load by at least 50 % in 3-4 feet depth water.  80% in chest to neck deep water.  Water will allow for   reduced joint loading through buoyancy to help patient improve posture without excess stress and pain.    Personal Factors and Comorbidities Comorbidity 1;Comorbidity 2    Comorbidities L TKA 01-26-21  DDD cardiac cath 2009  , Sciatica, RTC syndrome, plantar fasciitis, carpal tunnel.  OA, obesity,  See medical History, ALLERGIC to Percocet, Hydrocodone,Vicodin, Aleve    Examination-Activity Limitations Squat;Stand;Stairs;Bend;Carry;Lift;Locomotion Level;Reach Overhead;Transfers    Examination-Participation Restrictions Meal Prep    PT Frequency 2x / week    PT Duration 6 weeks    PT Treatment/Interventions ADLs/Self Care Home Management;Aquatic Therapy;Cryotherapy;Moist Heat;Balance training;Therapeutic exercise;Therapeutic activities;Functional mobility training;Stair training;Gait training;Neuromuscular re-education;Patient/family education;Passive range of motion;Manual techniques;Dry needling;Taping;Joint Manipulations;Vestibular    PT Next Visit Plan aquatics and reinforcement of exercise for strength  Redo BERG balance    PT Home Exercise Plan [email or web address] - sit to stand, heel raise, chin tuck, scapular retraction, upper trap/ levator scapulae stretch, log roll    Consulted and Agree with Plan of Care Patient             Patient will benefit from skilled therapeutic intervention in order to improve the following deficits and impairments:  Pain, Obesity, Postural dysfunction, Improper body mechanics, Difficulty walking, Decreased mobility, Decreased endurance, Decreased range of motion, Decreased strength, Hypomobility, Increased muscle spasms, Impaired flexibility, Impaired UE functional use, Decreased activity tolerance, Decreased balance  Visit Diagnosis: Cervicalgia  Muscle weakness (generalized)  Unsteadiness on feet  Cramp and spasm  Repeated falls  Acute pain of left knee  Stiffness of left knee, not elsewhere classified  Difficulty in walking, not elsewhere  classified     Problem List Patient Active Problem List   Diagnosis Date Noted   Allergic reaction caused by a drug 07/26/2021   Primary osteoarthritis of left knee 12/29/2019   Morbid obesity (HCC)    Bradycardia    Pulmonary embolism on long-term anticoagulation therapy (HCC) 04/06/2016   Pain in the chest    Chronic pain    Chest wall pain    Pulmonary embolism (HCC) 07/31/2013   Vocal cord dysfunction 02/28/2012   Dyspnea 01/17/2012   Chest pain 11/25/2011   Hypokalemia 11/25/2011   GERD (gastroesophageal reflux disease) 03/08/2011   DYSMENORRHEA 11/10/2010   PLANTAR FASCIITIS 09/14/2010   OSTEOARTHRITIS, KNEES, BILATERAL, 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 01/03/2007   Irritable bowel syndrome 01/03/2007   Garen Lah, PT, ATRIC Certified Exercise Expert for the Aging Adult  09/14/21 3:43 PM Phone: [phone] Fax: [phone]   [facility] [address] [place], [place], [address] Phone: [phone]   Fax:  [phone]  Name: Katie [name] MRN: [id number] Date of Birth: 1966-09-19

## 2021-09-15 DIAGNOSIS — Z96642 Presence of left artificial hip joint: Secondary | ICD-10-CM | POA: Diagnosis not present

## 2021-09-16 DIAGNOSIS — Z96642 Presence of left artificial hip joint: Secondary | ICD-10-CM | POA: Diagnosis not present

## 2021-09-17 DIAGNOSIS — Z96642 Presence of left artificial hip joint: Secondary | ICD-10-CM | POA: Diagnosis not present

## 2021-09-18 DIAGNOSIS — Z96642 Presence of left artificial hip joint: Secondary | ICD-10-CM | POA: Diagnosis not present

## 2021-09-19 DIAGNOSIS — Z96642 Presence of left artificial hip joint: Secondary | ICD-10-CM | POA: Diagnosis not present

## 2021-09-20 ENCOUNTER — Other Ambulatory Visit: Payer: Self-pay

## 2021-09-20 ENCOUNTER — Ambulatory Visit: Payer: Medicaid Other | Admitting: Physical Therapy

## 2021-09-20 DIAGNOSIS — M542 Cervicalgia: Secondary | ICD-10-CM | POA: Diagnosis not present

## 2021-09-20 DIAGNOSIS — R2681 Unsteadiness on feet: Secondary | ICD-10-CM

## 2021-09-20 DIAGNOSIS — R262 Difficulty in walking, not elsewhere classified: Secondary | ICD-10-CM

## 2021-09-20 DIAGNOSIS — M6281 Muscle weakness (generalized): Secondary | ICD-10-CM

## 2021-09-20 DIAGNOSIS — M25562 Pain in left knee: Secondary | ICD-10-CM

## 2021-09-20 DIAGNOSIS — Z96642 Presence of left artificial hip joint: Secondary | ICD-10-CM | POA: Diagnosis not present

## 2021-09-20 DIAGNOSIS — M25662 Stiffness of left knee, not elsewhere classified: Secondary | ICD-10-CM

## 2021-09-20 DIAGNOSIS — R252 Cramp and spasm: Secondary | ICD-10-CM

## 2021-09-20 NOTE — Patient Instructions (Signed)
Access Code: [email or web address] URL: [email or web address] Date: 09/20/2021 Prepared by: Garen Lah Added to HEP today Exercises  Hip Extension with Resistance Loop - 1 x daily - 7 x weekly - 3 sets - 10 reps Hip Abduction with Resistance Loop - 1 x daily - 7 x weekly - 3 sets - 10 reps Wall Squat - 1 x daily - 7 x weekly - 2 sets - 10 reps - 10 sec hold Reverse Lunge - 1 x daily - 7 x weekly - 2 sets - 10 reps  Garen Lah, PT, ATRIC Certified Exercise Expert for the Aging Adult  09/20/21 3:29 PM Phone: [phone] Fax: [phone]            Garen Lah, PT, ATRIC Certified Exercise Expert for the Aging Adult  09/20/21 3:31 PM Phone: [phone] Fax: [phone]

## 2021-09-20 NOTE — Therapy (Signed)
[facility] [address] [place], [place], [address] Phone: [phone]   Fax:  [phone]  Physical Therapy Treatment  Patient Details  Name: Katie [name] MRN: [id number] Date of Birth: July 18, 1966 Referring Provider (PT): Coletta Memos MD   Encounter Date: 09/20/2021   PT End of Session - 09/20/21 1513     Visit Number 6    Number of Visits 16    Date for PT Re-Evaluation 10/17/21    Authorization Type healthy Blue  no TX, esitm , vaso or ionto    PT Start Time 1501    PT Stop Time 1545    PT Time Calculation (min) 44 min    Activity Tolerance Patient tolerated treatment well    Behavior During Therapy [facility] for tasks assessed/performed             Past Medical History:  Diagnosis Date   Anxiety    Back pain    Bipolar 1 disorder (HCC)    Chronic pain entered 08/15/2015   Treated with Oxycodone   Depression    Depression    Dyspnea    History of acute bronchitis    HTN (hypertension)    OA (osteoarthritis)    Obesity    PE (pulmonary embolism)    oct 2014   Sciatica     Past Surgical History:  Procedure Laterality Date   ADENOIDECTOMY     BIOPSY STOMACH     CARDIAC CATHETERIZATION  2009   normal; in [place]   CESAREAN SECTION  2002   x 2, 1997 and 2002   CHOLECYSTECTOMY  2007   KNEE SURGERY Right 2005   TONSILLECTOMY     TOTAL KNEE ARTHROPLASTY Left 01/27/2020   Procedure: TOTAL KNEE ARTHROPLASTY;  Surgeon: [name], MD;  Location: [facility];  Service: Orthopedics;  Laterality: Left;   TYMPANOSTOMY TUBE PLACEMENT      There were no vitals filed for this visit.   Subjective Assessment - 09/20/21 1509     Subjective Pt able to do 6 min and 4 sec of liturgical dance at church this past weekend.  I am feeling tight today and sciatica with the weather and the rain today.    Pertinent History L TKA 01-26-21  DDD cardiac cath 2009  , Sciatica, RTC syndrome, plantar fasciitis, carpal tunnel.   OA, obesity,  See medical History, ALLERGIC to Percocet, Hydrocodone,Vicodin, Aleve    How long can you walk comfortably? I must shop with a shopping cart 15 minutes or I ride on a cart.    Diagnostic tests MRIAt C4-C5, posterior disc osteophyte complex contacts and mildly  flattens the ventral cord without significant canal stenosis. Mild  left foraminal stenosis at this level. Findings are similar to the  prior.  2. At C5-C6, new small central disc protrusion contacts the ventral  cord without cord deformation or significant canal stenosis.  3. Normal cord signal without enhancement.THORACIC. Since the 2017 prior there is new mild (10-20%) height loss of  the T1 and T3 vertebral bodies, suggestive of mild compression  fractures. There is mild marrow edema along the right T3 superior  endplate and bilateral T3 pedicles. It is unclear if this represents  recent or unhealed fractures or degenerative/stress related change.  Recommend correlation with the presence or absence of point  tenderness and any history of recent trauma.  2. Degenerative disc height loss at T9-T10, similar to prior. No  evidence of significant canal stenosis  or foraminal impingement.  3. Normal cord signal without enhancement.    Patient Stated Goals Pt would like to be able to walk without fear of falling.  and not holding onto walls,  would like to shop for groceries without dependence on cart.  play with son on floor.  do household chores without excruciating pain.  and shop forgroceries without having to ride on motorized cart or hanging onto shopping cart    Currently in Pain? Yes    Pain Score 7     Pain Location Generalized    Pain Orientation Right    Pain Descriptors / Indicators Sore    Pain Type Chronic pain    Pain Onset More than a month ago    Pain Score 7    Pain Location Back    Pain Orientation Lower;Right    Pain Type Chronic pain              [facility] Adult PT Treatment/Exercise:   Therapeutic Exercise:      Nu step level 4 for 6 min  RPE 4/5 Sit to stand 3 x 10 with 10 lb KB Wall sit 10 x 10 sec Hip bridge 2 x 10  Hooklying resisted hip abduction 2 x 10 blue band   standing hip extension 3  x 10 with green t band Standing hip abduction 3 x 10 with GTB    Not performed today:    Upper trap / levator scapulae stretch 1 x 30 sec Chin tuck 1 x 10 holding 5 seconds Demonstration and cues for proper form Scapular retraction 1 x 10  holding 5 sec Seated thoracic rotation with arms cross 1 x 10   5.  Sit to stand from raised height 1 x 10   Manual Therapy:   Not performed today: N/A   Neuromuscular re-ed: Anticipitory balance with SLS and use of KB for self perturbations PT providing perturbations with reaactive balance with side leans , back leans and front leans.   Dynamic gait with perturbations to simulate crowded stores   Therapeutic Activity: N/A   Modalities: N/A   Self Care:             N/A  [facility] PT Assessment - 09/20/21 0001       Sit to Stand   Comments 5 x STS 12.02sec      AROM   Cervical - Right Rotation 60    Cervical - Left Rotation 55                                     PT Education - 09/20/21 1531     Education Details added to standing LE strength exercises  worked on Printmaker and reactive balance              PT Short Term Goals - 09/20/21 1533       PT SHORT TERM GOAL #1   Title Independent with initial HEP since hospitalization    Baseline able to do intiial supine exercise  moving on to advanced    Time 3    Period Weeks    Status Achieved      PT SHORT TERM GOAL #2   Title Pt will be able to perform 6 minute walk test with 1809 feet    Baseline  1120 feet with cane    Time 3    Period Weeks  Target Date 09/26/21      PT SHORT TERM GOAL #3   Title Pt will be able to perform sit to stand with proper form without using momentum and compensations    Baseline 5 x STS 68.9 sec with  compensations  09-05-21 24.12.  5 x sts 12.06 sec    Time 3    Period Weeks    Status Achieved    Target Date 09/26/21      PT SHORT TERM GOAL #4   Title Pt will be offered opportunity for aquatic therapy to advance current program she is using on her own    Baseline scheduled for 2-3 additional aquatic sessions before joining the [employer]    Time 3    Period Weeks    Status Achieved    Target Date 09/26/21               PT Long Term Goals - 09/20/21 1535       PT LONG TERM GOAL #1   Title Pt will be independent with advanced HEP given    Baseline started advanced    Time 6    Period Weeks    Status On-going      PT LONG TERM GOAL #2   Title Pt will be able to negotiate steps without exacerbation of pain greater than 2/10  and LRAD    Baseline Pt able to use cane with step over step technique Pt complains of 7/10 pain    Time 6    Period Weeks    Status On-going      PT LONG TERM GOAL #3   Title Pt will increase neck AROM 10 degreees bil to increase abilty to turn neck for ambulation and visual scanning    Baseline AROM R rot 39, L rot 35   R rot to 60, Lrotation to 55    Time 6    Period Weeks    Status Achieved      PT LONG TERM GOAL #4   Title Pt will be able to perform standing to floor transfer in order to play with kids and to ensure safety in home    Baseline working on strength of LE 5 x STS 12.06 sec    Time 6    Period Weeks    Status On-going      PT LONG TERM GOAL #5   Title PT with be able to walk/stand >/= 30 min with no AD with </= 3/10 pain for functional endurance and return to shopping/leisure activities    Baseline 6 MWT  1120  ( Normal 1809 ft )    Time 6    Period Weeks    Status On-going      PT LONG TERM GOAL #6   Title Pt will incorporate regular land based exercise in order to maintain strength for ADL/household tasks    Baseline Pt able to do 6 MWT and now advanced LE standing exercise    Time 6    Period Weeks    Status On-going       PT LONG TERM GOAL #7   Title increase BERG balance  to >/= 50/56 to demo improvement in balance and safety    Baseline unable to test    Time 6    Period Weeks    Status Unable to assess                   Plan - 09/20/21 1518  Clinical Impression Statement Pt able to perform 5 x STS 12.06 sec now showing improved LE strength.  Pt increased 6 mwt to 1120 ft.  Pt is now advancing to standing exercise. and strengthening. Pt benefitted from spending time with neuro reed with anticipitory and reactive balance training.  Pt now able to move from supine exercises to more functional standing exercises .  Walking was encouraged with cane to increase confidence and competence with locomotion.  Will continue with aquatics for strength and land based until pt achieves goals    Personal Factors and Comorbidities Comorbidity 1;Comorbidity 2    Comorbidities L TKA 01-26-21  DDD cardiac cath 2009  , Sciatica, RTC syndrome, plantar fasciitis, carpal tunnel.  OA, obesity,  See medical History, ALLERGIC to Percocet, Hydrocodone,Vicodin, Aleve    Examination-Activity Limitations Squat;Stand;Stairs;Bend;Carry;Lift;Locomotion Level;Reach Overhead;Transfers    Examination-Participation Restrictions Meal Prep    PT Frequency 2x / week    PT Duration 6 weeks    PT Treatment/Interventions ADLs/Self Care Home Management;Aquatic Therapy;Cryotherapy;Moist Heat;Balance training;Therapeutic exercise;Therapeutic activities;Functional mobility training;Stair training;Gait training;Neuromuscular re-education;Patient/family education;Passive range of motion;Manual techniques;Dry needling;Taping;Joint Manipulations;Vestibular    PT Next Visit Plan aquatics and reinforcement of exercise for strength BERG/ DGI    PT Home Exercise Plan [id number] - sit to stand, heel raise, chin tuck, scapular retraction, upper trap/ levator scapulae stretch, log roll    Consulted and Agree with Plan of Care Patient              Patient will benefit from skilled therapeutic intervention in order to improve the following deficits and impairments:  Pain, Obesity, Postural dysfunction, Improper body mechanics, Difficulty walking, Decreased mobility, Decreased endurance, Decreased range of motion, Decreased strength, Hypomobility, Increased muscle spasms, Impaired flexibility, Impaired UE functional use, Decreased activity tolerance, Decreased balance  Visit Diagnosis: Cervicalgia  Unsteadiness on feet  Muscle weakness (generalized)  Cramp and spasm  Acute pain of left knee  Stiffness of left knee, not elsewhere classified  Difficulty in walking, not elsewhere classified     Problem List Patient Active Problem List   Diagnosis Date Noted   Allergic reaction caused by a drug 07/26/2021   Primary osteoarthritis of left knee 12/29/2019   Morbid obesity (HCC)    Bradycardia    Pulmonary embolism on long-term anticoagulation therapy (HCC) 04/06/2016   Pain in the chest    Chronic pain    Chest wall pain    Pulmonary embolism (HCC) 07/31/2013   Vocal cord dysfunction 02/28/2012   Dyspnea 01/17/2012   Chest pain 11/25/2011   Hypokalemia 11/25/2011   GERD (gastroesophageal reflux disease) 03/08/2011   DYSMENORRHEA 11/10/2010   PLANTAR FASCIITIS 09/14/2010   OSTEOARTHRITIS, KNEES, BILATERAL, 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 01/03/2007   Irritable bowel syndrome 01/03/2007    Garen Lah, PT, ATRIC Certified Exercise Expert for the Aging Adult  09/20/21 5:11 PM Phone: [phone] Fax: [phone]   [facility] [address] [place], [place],  [address] Phone: [phone]   Fax:  [phone]  Name: Katie [name] MRN: [id number] Date of Birth: 1966/08/17

## 2021-09-21 ENCOUNTER — Ambulatory Visit: Payer: Medicaid Other | Admitting: Physical Therapy

## 2021-09-21 DIAGNOSIS — M6281 Muscle weakness (generalized): Secondary | ICD-10-CM

## 2021-09-21 DIAGNOSIS — M542 Cervicalgia: Secondary | ICD-10-CM | POA: Diagnosis not present

## 2021-09-21 DIAGNOSIS — R262 Difficulty in walking, not elsewhere classified: Secondary | ICD-10-CM

## 2021-09-21 DIAGNOSIS — M25662 Stiffness of left knee, not elsewhere classified: Secondary | ICD-10-CM

## 2021-09-21 DIAGNOSIS — R252 Cramp and spasm: Secondary | ICD-10-CM

## 2021-09-21 DIAGNOSIS — R2681 Unsteadiness on feet: Secondary | ICD-10-CM

## 2021-09-21 DIAGNOSIS — M25562 Pain in left knee: Secondary | ICD-10-CM

## 2021-09-21 DIAGNOSIS — R296 Repeated falls: Secondary | ICD-10-CM

## 2021-09-21 DIAGNOSIS — Z96642 Presence of left artificial hip joint: Secondary | ICD-10-CM | POA: Diagnosis not present

## 2021-09-21 NOTE — Therapy (Signed)
[place], [place], [address] Phone: [phone]   Fax:  [phone]  Physical Therapy Treatment  Patient Details  Name: Katie [name] MRN: [id number] Date of Birth: Feb 01, 1966 Referring Provider (PT): Ashok Pall MD   Encounter Date: 09/21/2021   PT End of Session - 09/21/21 1353     Visit Number 7    Number of Visits 16    Date for PT Re-Evaluation 10/17/21    Authorization Type healthy Blue  no TX, esitm , vaso or ionto    PT Start Time 5465    PT Stop Time 1440    PT Time Calculation (min) 55 min    Activity Tolerance Patient tolerated treatment well    Behavior During Therapy [facility] for tasks assessed/performed             Past Medical History:  Diagnosis Date   Anxiety    Back pain    Bipolar 1 disorder ([place])    Chronic pain entered 08/15/2015   Treated with Oxycodone   Depression    Depression    Dyspnea    History of acute bronchitis    HTN (hypertension)    OA (osteoarthritis)    Obesity    PE (pulmonary embolism)    oct 2014   Sciatica     Past Surgical History:  Procedure Laterality Date   ADENOIDECTOMY     BIOPSY STOMACH     CARDIAC CATHETERIZATION  2009   normal; in [place]  2002   x 2, 1997 and 2002   CHOLECYSTECTOMY  2007   KNEE SURGERY Right 2005   TONSILLECTOMY     TOTAL KNEE ARTHROPLASTY Left 01/27/2020   Procedure: TOTAL KNEE ARTHROPLASTY;  Surgeon: Renette Butters, MD;  Location: [facility];  Service: Orthopedics;  Laterality: Left;   TYMPANOSTOMY TUBE PLACEMENT      There were no vitals filed for this visit.   Subjective Assessment - 09/21/21 1349     Subjective Pt enters pool with cane and 3/10 pain today.  Feeling better than normal today.  Pt reports being able to sleep in bed for 5 hours or more. Pt trying to walking in her neighborhood about 20 minutes now with friends.    Pertinent History L TKA 01-26-21  DDD cardiac cath 2009   , Sciatica, RTC syndrome, plantar fasciitis, carpal tunnel.  OA, obesity,  See medical History, ALLERGIC to Percocet, Hydrocodone,Vicodin, Aleve    Diagnostic tests MRIAt C4-C5, posterior disc osteophyte complex contacts and mildly  flattens the ventral cord without significant canal stenosis. Mild  left foraminal stenosis at this level. Findings are similar to the  prior.  2. At C5-C6, new small central disc protrusion contacts the ventral  cord without cord deformation or significant canal stenosis.  3. Normal cord signal without enhancement.THORACIC. Since the 2017 prior there is new mild (10-20%) height loss of  the T1 and T3 vertebral bodies, suggestive of mild compression  fractures. There is mild marrow edema along the right T3 superior  endplate and bilateral T3 pedicles. It is unclear if this represents  recent or unhealed fractures or degenerative/stress related change.  Recommend correlation with the presence or absence of point  tenderness and any history of recent trauma.  2. Degenerative disc height loss at T9-T10, similar to prior. No  evidence of significant canal stenosis or foraminal impingement.  3. Normal cord signal without enhancement.    Patient  Stated Goals Pt would like to be able to walk without fear of falling.  and not holding onto walls,  would like to shop for groceries without dependence on cart.  play with son on floor.  do household chores without excruciating pain.  and shop forgroceries without having to ride on motorized cart or hanging onto shopping cart    Currently in Pain? Yes    Pain Score 3     Pain Location Generalized    Pain Orientation Right    Pain Descriptors / Indicators Sore    Pain Type Chronic pain    Pain Onset More than a month ago    Pain Frequency Intermittent    Pain Score 4    Pain Location Back    Pain Orientation Lower;Right    Pain Type Chronic pain    Pain Onset More than a month ago    Pain Frequency Intermittent                 Aquatic therapy at [address] - therapeutic pool temp 94 degrees Pt enters building with cane. Treatment took place in water 3.8 to  4 ft 8 in.feet deep depending upon activity.  Pt entered and exited the pool via stair and handrails independently.     Ms Pittman entered water for aquatic therapy for second time  at [employer].  Pt acclimated to pool  and was able to ambulate across pool 15 ft x 6, then side steps x 6 and then ambulates backward x 6.    She was able to use aquatic barbells while ambulating to increase abdominal engagement with exercises as well as red weights (2.5 #)to add resistance  .  On edge of pool with bil UE support  Pt performed LE exercise Hip hinge against pool wall. Hip abd/add R/L 20 x each and then using 1 UE support Hip ext/flex with knee straight x 20, pt needing VC and TC for correct execution and sequencing Marching knee/hip 90/90 x 10 and then with added  yellow pool noodle for increased resistance x 10 each leg ham curl R/L x 20.  Squats x 20 reps with intermittent UE support x 2 sets. Lunge to target x 10 on R and the x 10 on LE Triangle pose with noodle Cat camel with noodle Use of pool noodle with trunk rotation to L and to R Jumping jacks 1 minute continuous rest and then additional minute  Runners stretch  2 x  30 sec on Left and then moves into hamstring stretch  Then runners stretch on R 2 x 30 sec  and then move into hamstring stretch . TC to insure proper technique. Figure 4 squat stretch with UE support for R and L x 60 sec each   Bil ankle heel raise on step x 12  Working with SLS  with unweighted leg flexion and then extension while maintaining balance and turbulence in water on R and then L   Ai  for deep breathing and pain control using slow controlled Tai chi like movements with shoulders submerged.  Emphasis on shoulder movement and abdominal engagement .   Ai Chi   posture of contemplating to floating then  uplifting to enclosing,  Folding, soothing gathering and ending with freeing motion.  All 8-10 x each - cues given to keep core tight for improved trunk stabilization; Utilization of  hip ER and IR dynamic with Shifting, accepting, accepting with Shirlee [place] and Balancing posture 8-10  reps each with pt maintaining balance with turbulence in water   Supine abdominal hollowing with VC to keep hips up and feet up with barbells in UE submerged as much as possible in 3 minutes  Pt fatigued and rested.  Worked on prone aquatic barbell submerged for abdominal engagement Combo prone to supine with legs extended for 3 minutes to encourage active abdominal engagement.      Ended with water jogging in water for 4 minutes to encourage endurance.       Pt requires the buoyancy of water for active assisted exercises with buoyancy supported for strengthening and AROM exercises: PT  requires the viscosity of the water for resistance with strengthening exercises as well as balance Hydrostatic pressure also supports joints by unweighting joint load by at least 50 % in 3-4 feet depth water. 80% in chest to neck deep water. Water will allow for  reduced joint loading through buoyancy to help patient improve posture without excess stress and pain.                          PT Education - 09/21/21 1538     Education Details gave aquatic HEP with [employer] with laminated HEP reinforcing HEP for strength    Person(s) Educated Patient    Methods Explanation;Demonstration;Tactile cues;Verbal cues;Handout    Comprehension Verbalized understanding;Returned demonstration              PT Short Term Goals - 09/20/21 1533       PT SHORT TERM GOAL #1   Title Independent with initial HEP since hospitalization    Baseline able to do intiial supine exercise  moving on to advanced    Time 3    Period Weeks    Status Achieved      PT SHORT TERM GOAL #2   Title Pt will be able to perform 6 minute walk  test with 1809 feet    Baseline 6MWT  1120 feet with cane    Time 3    Period Weeks    Target Date 09/26/21      PT SHORT TERM GOAL #3   Title Pt will be able to perform sit to stand with proper form without using momentum and compensations    Baseline 5 x STS 68.9 sec with compensations  09-05-21 24.12.  5 x sts 12.06 sec    Time 3    Period Weeks    Status Achieved    Target Date 09/26/21      PT SHORT TERM GOAL #4   Title Pt will be offered opportunity for aquatic therapy to advance current program she is using on her own    Baseline scheduled for 2-3 additional aquatic sessions before joining the [employer]    Time 3    Period Weeks    Status Achieved    Target Date 09/26/21               PT Long Term Goals - 09/20/21 1535       PT LONG TERM GOAL #1   Title Pt will be independent with advanced HEP given    Baseline started advanced    Time 6    Period Weeks    Status On-going      PT LONG TERM GOAL #2   Title Pt will be able to negotiate steps without exacerbation of pain greater than 2/10  and LRAD    Baseline Pt able to use  cane with step over step technique Pt complains of 7/10 pain    Time 6    Period Weeks    Status On-going      PT LONG TERM GOAL #3   Title Pt will increase neck AROM 10 degreees bil to increase abilty to turn neck for ambulation and visual scanning    Baseline AROM R rot 39, L rot 35   R rot to 60, Lrotation to 55    Time 6    Period Weeks    Status Achieved      PT LONG TERM GOAL #4   Title Pt will be able to perform standing to floor transfer in order to play with kids and to ensure safety in home    Baseline working on strength of LE 5 x STS 12.06 sec    Time 6    Period Weeks    Status On-going      PT LONG TERM GOAL #5   Title PT with be able to walk/stand >/= 30 min with no AD with </= 3/10 pain for functional endurance and return to shopping/leisure activities    Baseline 6 MWT  1120  ( Normal 1809 ft )    Time 6    Period  Weeks    Status On-going      PT LONG TERM GOAL #6   Title Pt will incorporate regular land based exercise in order to maintain strength for ADL/household tasks    Baseline Pt able to do 6 MWT and now advanced LE standing exercise    Time 6    Period Weeks    Status On-going      PT LONG TERM GOAL #7   Title increase BERG balance  to >/= 50/56 to demo improvement in balance and safety    Baseline unable to test    Time 6    Period Weeks    Status Unable to assess                   Plan - 09/21/21 1353     Clinical Impression Statement Pt atttended aquatics for second time and pain with 3/10 intially and pt able to participate fully for 55 minutes.  Ms Wilbon reports she is able to sleep in bed 5 hours uninterrupted since adding more exercise/aquatics.  she reports she is trying to walk more and walked 20 minutes with friends in her neighborhood.  Pt with more flexiblity in water and able to perform exercise. she takes time to stretch back as needed as she challenges herself with balance and strength adding weight in water and now on land.Pt requires the buoyancy of water for active assisted exercises with buoyancy supported for strengthening and AROM exercises: PT  requires the viscosity of the water for resistance with strengthening exercises as well as balance  Hydrostatic pressure also supports joints by unweighting joint load by at least 50 % in 3-4 feet depth water. 80% in chest to neck deep water.  Water will allow for  reduced joint loading through buoyancy to help patient improve posture without excess stress and pain.  Will continue to develop HEP for aquatics as she seeks to return [facility] the [facility]    Personal Factors and Comorbidities Comorbidity 1;Comorbidity 2    Comorbidities L TKA 01-26-21  DDD cardiac cath 2009  , Sciatica, RTC syndrome, plantar fasciitis, carpal tunnel.  OA, obesity,  See medical History, ALLERGIC to Percocet, Hydrocodone,Vicodin, Aleve  Examination-Activity Limitations Squat;Stand;Stairs;Bend;Carry;Lift;Locomotion Level;Reach Overhead;Transfers    Examination-Participation Restrictions Meal Prep    PT Frequency 2x / week    PT Duration 6 weeks    PT Treatment/Interventions ADLs/Self Care Home Management;Aquatic Therapy;Cryotherapy;Moist Heat;Balance training;Therapeutic exercise;Therapeutic activities;Functional mobility training;Stair training;Gait training;Neuromuscular re-education;Patient/family education;Passive range of motion;Manual techniques;Dry needling;Taping;Joint Manipulations;Vestibular    PT Next Visit Plan aquatics and reinforcement of exercise for strength BERG/ DGI    PT Home Exercise Plan [id number] - sit to stand, heel raise, chin tuck, scapular retraction, upper trap/ levator scapulae stretch, log roll    Consulted and Agree with Plan of Care Patient             Patient will benefit from skilled therapeutic intervention in order to improve the following deficits and impairments:  Pain, Obesity, Postural dysfunction, Improper body mechanics, Difficulty walking, Decreased mobility, Decreased endurance, Decreased range of motion, Decreased strength, Hypomobility, Increased muscle spasms, Impaired flexibility, Impaired UE functional use, Decreased activity tolerance, Decreased balance  Visit Diagnosis: Cervicalgia  Unsteadiness on feet  Muscle weakness (generalized)  Cramp and spasm  Acute pain of left knee  Stiffness of left knee, not elsewhere classified  Difficulty in walking, not elsewhere classified  Repeated falls     Problem List Patient Active Problem List   Diagnosis Date Noted   Allergic reaction caused by a drug 07/26/2021   Primary osteoarthritis of left knee 12/29/2019   Morbid obesity ([place])    Bradycardia    Pulmonary embolism on long-term anticoagulation therapy ([place]) 04/06/2016   Pain in the chest    Chronic pain    Chest wall pain    Pulmonary embolism ([place]) 07/31/2013    Vocal cord dysfunction 02/28/2012   Dyspnea 01/17/2012   Chest pain 11/25/2011   Hypokalemia 11/25/2011   GERD (gastroesophageal reflux disease) 03/08/2011   DYSMENORRHEA 11/10/2010   PLANTAR FASCIITIS 09/14/2010   OSTEOARTHRITIS, KNEES, BILATERAL, SEVERE 06/22/2010   ROTATOR CUFF SYNDROME, RIGHT 04/11/2010   VITAMIN D DEFICIENCY 02/18/2010   Bipolar 1 disorder ([place]) 01/17/2010   Essential hypertension, benign 01/17/2010   INSOMNIA 01/17/2010   OBESITY, NOS 01/03/2007   Depression 01/03/2007   HEARING LOSS NOS OR DEAFNESS 01/03/2007   EDEMA-LEGS,DUE TO VENOUS OBSTRUCT. 01/03/2007   Irritable bowel syndrome 01/03/2007    Dorothea Ogle, PT 09/21/2021, 3:52 PM  [facility] [address] [place], [place], [address] Phone: [phone]   Fax:  [phone]  Name: Katie [name] MRN: [id number] Date of Birth: 1966/09/09

## 2021-09-21 NOTE — Patient Instructions (Signed)
Gave laminated HEP for [employer] and pt return demo  Garen Lah, PT, [facility] Certified Exercise Expert for the Aging Adult  09/21/21 3:39 PM Phone: [phone] Fax: [phone]

## 2021-09-22 DIAGNOSIS — Z96642 Presence of left artificial hip joint: Secondary | ICD-10-CM | POA: Diagnosis not present

## 2021-09-23 DIAGNOSIS — Z96642 Presence of left artificial hip joint: Secondary | ICD-10-CM | POA: Diagnosis not present

## 2021-09-24 DIAGNOSIS — Z96642 Presence of left artificial hip joint: Secondary | ICD-10-CM | POA: Diagnosis not present

## 2021-09-25 DIAGNOSIS — Z96642 Presence of left artificial hip joint: Secondary | ICD-10-CM | POA: Diagnosis not present

## 2021-09-26 DIAGNOSIS — Z96642 Presence of left artificial hip joint: Secondary | ICD-10-CM | POA: Diagnosis not present

## 2021-09-27 ENCOUNTER — Ambulatory Visit: Payer: Medicaid Other | Admitting: Physical Therapy

## 2021-09-27 ENCOUNTER — Other Ambulatory Visit: Payer: Self-pay

## 2021-09-27 ENCOUNTER — Encounter: Payer: Self-pay | Admitting: Physical Therapy

## 2021-09-27 DIAGNOSIS — R262 Difficulty in walking, not elsewhere classified: Secondary | ICD-10-CM

## 2021-09-27 DIAGNOSIS — Z96642 Presence of left artificial hip joint: Secondary | ICD-10-CM | POA: Diagnosis not present

## 2021-09-27 DIAGNOSIS — M25562 Pain in left knee: Secondary | ICD-10-CM

## 2021-09-27 DIAGNOSIS — R2681 Unsteadiness on feet: Secondary | ICD-10-CM

## 2021-09-27 DIAGNOSIS — M25662 Stiffness of left knee, not elsewhere classified: Secondary | ICD-10-CM

## 2021-09-27 DIAGNOSIS — R252 Cramp and spasm: Secondary | ICD-10-CM

## 2021-09-27 DIAGNOSIS — M6281 Muscle weakness (generalized): Secondary | ICD-10-CM

## 2021-09-27 DIAGNOSIS — R296 Repeated falls: Secondary | ICD-10-CM

## 2021-09-27 DIAGNOSIS — M542 Cervicalgia: Secondary | ICD-10-CM | POA: Diagnosis not present

## 2021-09-27 NOTE — Patient Instructions (Signed)
     Garen Lah, PT, [facility] Certified Exercise Expert for the Aging Adult  09/27/21 3:56 PM Phone: [phone] Fax: [phone]

## 2021-09-27 NOTE — Therapy (Signed)
[facility] [address] [place], [place], [address] Phone: [phone]   Fax:  [phone]  Physical Therapy Treatment  Patient Details  Name: Katie [name] MRN: [id number] Date of Birth: 1966/09/18 Referring Provider (PT): Coletta Memos MD   Encounter Date: 09/27/2021   PT End of Session - 09/27/21 1603     Visit Number 8    Number of Visits 16    Date for PT Re-Evaluation 10/17/21    Authorization Type healthy Blue  no TX, esitm , vaso or ionto    PT Start Time 1500    PT Stop Time 1555    PT Time Calculation (min) 55 min    Activity Tolerance Patient tolerated treatment well    Behavior During Therapy [facility] for tasks assessed/performed             Past Medical History:  Diagnosis Date   Anxiety    Back pain    Bipolar 1 disorder (HCC)    Chronic pain entered 08/15/2015   Treated with Oxycodone   Depression    Depression    Dyspnea    History of acute bronchitis    HTN (hypertension)    OA (osteoarthritis)    Obesity    PE (pulmonary embolism)    oct 2014   Sciatica     Past Surgical History:  Procedure Laterality Date   ADENOIDECTOMY     BIOPSY STOMACH     CARDIAC CATHETERIZATION  2009   normal; in [place]   CESAREAN SECTION  2002   x 2, 1997 and 2002   CHOLECYSTECTOMY  2007   KNEE SURGERY Right 2005   TONSILLECTOMY     TOTAL KNEE ARTHROPLASTY Left 01/27/2020   Procedure: TOTAL KNEE ARTHROPLASTY;  Surgeon: [name], MD;  Location: [facility];  Service: Orthopedics;  Laterality: Left;   TYMPANOSTOMY TUBE PLACEMENT      There were no vitals filed for this visit.   Subjective Assessment - 09/27/21 1606     Subjective Pt enters clinic with cane and walking without antalgic gait    Pertinent History L TKA 01-26-21  DDD cardiac cath 2009  , Sciatica, RTC syndrome, plantar fasciitis, carpal tunnel.  OA, obesity,  See medical History, ALLERGIC to Percocet, Hydrocodone,Vicodin, Aleve    How  long can you sit comfortably? sitting for 15 to 25 min for a show but shifting    How long can you stand comfortably? 15 min    How long can you walk comfortably? I must shop with a shopping cart 15 minutes or I ride on a cart.    Diagnostic tests MRIAt C4-C5, posterior disc osteophyte complex contacts and mildly  flattens the ventral cord without significant canal stenosis. Mild  left foraminal stenosis at this level. Findings are similar to the  prior.  2. At C5-C6, new small central disc protrusion contacts the ventral  cord without cord deformation or significant canal stenosis.  3. Normal cord signal without enhancement.THORACIC. Since the 2017 prior there is new mild (10-20%) height loss of  the T1 and T3 vertebral bodies, suggestive of mild compression  fractures. There is mild marrow edema along the right T3 superior  endplate and bilateral T3 pedicles. It is unclear if this represents  recent or unhealed fractures or degenerative/stress related change.  Recommend correlation with the presence or absence of point  tenderness and any history of recent trauma.  2. Degenerative disc height loss at T9-T10, similar  to prior. No  evidence of significant canal stenosis or foraminal impingement.  3. Normal cord signal without enhancement.    Patient Stated Goals Pt would like to be able to walk without fear of falling.  and not holding onto walls,  would like to shop for groceries without dependence on cart.  play with son on floor.  do household chores without excruciating pain.  and shop forgroceries without having to ride on motorized cart or hanging onto shopping cart    Currently in Pain? Yes    Pain Score 3     Pain Location Generalized    Pain Orientation Right    Pain Descriptors / Indicators Sore    Pain Type Chronic pain               [facility] Adult PT Treatment/Exercise:   Therapeutic Exercise:     Sit to stand 2 x 10 with 25lb KB 2 min rest between Wall sit 10 x 10 sec Hip bridge 2  x 10  with 10 lb DB Lunge with to chairs for UE support,  R side x 5 and L side x 5 2 minu rest and then 3 x on R and 3 x on L Alternating Single Leg Balance -  - minutes 2 -3 minutes  Step Up -3 sets - 10 reps  on R and on L Forward Step Down  on 2 inch step-  1 sets - 10 reps on left and Right and then on 4 inch step R and L  for 10 reps Heel raise 10 x 2 bil  and then 1 x 10 SLS on R and then L Upper trap / levator scapulae stretch 1 x 30 sec Chin tuck 1 x 10 holding 5 seconds Demonstration and cues for proper form Scapular retraction 1 x 10  holding 5 sec Seated thoracic rotation with arms cross 1 x 10      Not performed today:      Manual Therapy:      Neuromuscular re-ed: Anticipitory balance with SLS and use of KB for self perturbations PT providing perturbations with reaactive balance with side leans , back leans and front leans. There ex pad  for bil / SLS with UE use of 4 lb weight doing circles for self perturbation and challenge   Dynamic gait with perturbations to simulate crowded stores   Therapeutic Activity: N/A   Modalities: N/A   Self Care:             N/A  DGI  21/24   uses cane for balance and does well  Access Code: [email or web address]: [email or web address]: 11/22/2022Prepared by: Wayland Denis BeardsleyExercises   Alternating Single Leg Balance - Foot Behind - 1 x daily - 7 x weekly - 1 sets - 5 reps - minutes 2 -3 minutes  Step Up - 1 x daily - 7 x weekly - 3 sets - 10 reps  Forward Step Down - 1 x daily - 7 x weekly - 3 sets - 10 reps   [facility] PT Assessment - 09/27/21 0001       Standardized Balance Assessment   Standardized Balance Assessment Dynamic Gait Index      Dynamic Gait Index   Level Surface Normal    Change in Gait Speed Normal    Gait with Horizontal Head Turns Normal    Gait with Vertical Head Turns Normal    Gait and Pivot Turn Mild Impairment    Step Over Obstacle Mild Impairment  Step Around Obstacles Normal    Steps  Mild Impairment    Total Score 21    DGI comment: uses cane 21/24                                  PT Education - 09/27/21 1602     Education Details added single limb strength and balance to HEP    Person(s) Educated Patient    Methods Explanation;Demonstration;Tactile cues;Verbal cues;Handout    Comprehension Verbalized understanding;Returned demonstration              PT Short Term Goals - 09/20/21 1533       PT SHORT TERM GOAL #1   Title Independent with initial HEP since hospitalization    Baseline able to do intiial supine exercise  moving on to advanced    Time 3    Period Weeks    Status Achieved      PT SHORT TERM GOAL #2   Title Pt will be able to perform 6 minute walk test with 1809 feet    Baseline  1120 feet with cane    Time 3    Period Weeks    Target Date 09/26/21      PT SHORT TERM GOAL #3   Title Pt will be able to perform sit to stand with proper form without using momentum and compensations    Baseline 5 x STS 68.9 sec with compensations  09-05-21 24.12.  5 x sts 12.06 sec    Time 3    Period Weeks    Status Achieved    Target Date 09/26/21      PT SHORT TERM GOAL #4   Title Pt will be offered opportunity for aquatic therapy to advance current program she is using on her own    Baseline scheduled for 2-3 additional aquatic sessions before joining the [employer]    Time 3    Period Weeks    Status Achieved    Target Date 09/26/21               PT Long Term Goals - 09/27/21 1605       PT LONG TERM GOAL #1   Title Pt will be independent with advanced HEP given    Baseline working on advanced and balance    Time 6    Period Weeks    Status On-going      PT LONG TERM GOAL #2   Title Pt will be able to negotiate steps without exacerbation of pain greater than 2/10  and LRAD    Baseline Pt uses cane and 3/10 pain and step over step with LRAD    Time 6    Period Weeks    Status Achieved      PT LONG TERM  GOAL #3   Title Pt will increase neck AROM 10 degreees bil to increase abilty to turn neck for ambulation and visual scanning    Baseline AROM R rot 39, L rot 35   R rot to 60, Lrotation to 55    Time 6    Period Weeks    Status Achieved      PT LONG TERM GOAL #4   Title Pt will be able to perform standing to floor transfer in order to play with kids and to ensure safety in home    Baseline Pt working on lunging and transfers , must use  hands and chair to descend    Time 6    Period Weeks    Status On-going      PT LONG TERM GOAL #5   Title PT with be able to walk/stand >/= 30 min with no AD with </= 3/10 pain for functional endurance and return to shopping/leisure activities    Baseline DGI 21/24 with cane    Time 6    Period Weeks    Status On-going      PT LONG TERM GOAL #6   Title Pt will incorporate regular land based exercise in order to maintain strength for ADL/household tasks    Baseline Pt planning to join [employer]    Time 6    Period Weeks    Status On-going      PT LONG TERM GOAL #7   Title increase BERG balance  to >/= 50/56 to demo improvement in balance and safety    Baseline DGI 21/24  pt having problems with single limb strength but uses cane for adequate score    Time 6    Period Weeks    Status Achieved                   Plan - 09/27/21 1545     Clinical Impression Statement Pt with 21/24 DGI using cane.  Pt working on reactive / anticipitory balance to increase steadiness on feet without AD.  Pt must use AD for confidence and due to R LE weakness.  Pt has made great progress in goals. Goals LTG #2,3 and 7 achieved.  Ms Selway able to recover from reactive balance activities.  Pt with more difficulty with backward falls.  Pt now utilzing 25# for goblet squats . performing lunge activities for LE strength.  Pt will benefit from remainning visits to maximize strength for functional activities.    Personal Factors and Comorbidities Comorbidity 1;Comorbidity  2    Comorbidities L TKA 01-26-21  DDD cardiac cath 2009  , Sciatica, RTC syndrome, plantar fasciitis, carpal tunnel.  OA, obesity,  See medical History, ALLERGIC to Percocet, Hydrocodone,Vicodin, Aleve    Examination-Activity Limitations Squat;Stand;Stairs;Bend;Carry;Lift;Locomotion Level;Reach Overhead;Transfers    PT Frequency 2x / week    PT Duration 6 weeks    PT Treatment/Interventions ADLs/Self Care Home Management;Aquatic Therapy;Cryotherapy;Moist Heat;Balance training;Therapeutic exercise;Therapeutic activities;Functional mobility training;Stair training;Gait training;Neuromuscular re-education;Patient/family education;Passive range of motion;Manual techniques;Dry needling;Taping;Joint Manipulations;Vestibular    PT Next Visit Plan essential strength for gym    PT Home Exercise Plan [email or web address] - sit to stand, heel raise, chin tuck, scapular retraction, upper trap/ levator scapulae stretch, log roll    Consulted and Agree with Plan of Care Patient             Patient will benefit from skilled therapeutic intervention in order to improve the following deficits and impairments:  Pain, Obesity, Postural dysfunction, Improper body mechanics, Difficulty walking, Decreased mobility, Decreased endurance, Decreased range of motion, Decreased strength, Hypomobility, Increased muscle spasms, Impaired flexibility, Impaired UE functional use, Decreased activity tolerance, Decreased balance  Visit Diagnosis: Cervicalgia  Unsteadiness on feet  Muscle weakness (generalized)  Cramp and spasm  Acute pain of left knee  Stiffness of left knee, not elsewhere classified  Difficulty in walking, not elsewhere classified  Repeated falls     Problem List Patient Active Problem List   Diagnosis Date Noted   Allergic reaction caused by a drug 07/26/2021   Primary osteoarthritis of left knee 12/29/2019   Morbid obesity (HCC)  Bradycardia    Pulmonary embolism on long-term anticoagulation  therapy (HCC) 04/06/2016   Pain in the chest    Chronic pain    Chest wall pain    Pulmonary embolism (HCC) 07/31/2013   Vocal cord dysfunction 02/28/2012   Dyspnea 01/17/2012   Chest pain 11/25/2011   Hypokalemia 11/25/2011   GERD (gastroesophageal reflux disease) 03/08/2011   DYSMENORRHEA 11/10/2010   PLANTAR FASCIITIS 09/14/2010   OSTEOARTHRITIS, KNEES, BILATERAL, 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 01/03/2007   Irritable bowel syndrome 01/03/2007   Garen Lah, PT, ATRIC Certified Exercise Expert for the Aging Adult  09/27/21 4:19 PM Phone: [phone] Fax: [phone]   [facility] [address] [place], [place], [address] Phone: [phone]   Fax:  [phone]  Name: DONALD JACQUE MRN: [id number] Date of Birth: August 20, 1966

## 2021-09-28 ENCOUNTER — Encounter: Payer: Self-pay | Admitting: Physical Therapy

## 2021-09-28 ENCOUNTER — Ambulatory Visit: Payer: Medicaid Other | Admitting: Physical Therapy

## 2021-09-28 DIAGNOSIS — M25562 Pain in left knee: Secondary | ICD-10-CM

## 2021-09-28 DIAGNOSIS — M542 Cervicalgia: Secondary | ICD-10-CM | POA: Diagnosis not present

## 2021-09-28 DIAGNOSIS — Z96642 Presence of left artificial hip joint: Secondary | ICD-10-CM | POA: Diagnosis not present

## 2021-09-28 DIAGNOSIS — R296 Repeated falls: Secondary | ICD-10-CM

## 2021-09-28 DIAGNOSIS — M6281 Muscle weakness (generalized): Secondary | ICD-10-CM

## 2021-09-28 DIAGNOSIS — R262 Difficulty in walking, not elsewhere classified: Secondary | ICD-10-CM

## 2021-09-28 DIAGNOSIS — M25662 Stiffness of left knee, not elsewhere classified: Secondary | ICD-10-CM

## 2021-09-28 DIAGNOSIS — R252 Cramp and spasm: Secondary | ICD-10-CM

## 2021-09-28 DIAGNOSIS — R2681 Unsteadiness on feet: Secondary | ICD-10-CM

## 2021-09-28 NOTE — Patient Instructions (Signed)
Access Code: [email or web address] URL: [email or web address] Date: 09/27/2021 Prepared by: Garen Lah  Exercises Standing March at [facility] - 1 x daily - 7 x weekly - 3 sets - 10 reps Lateral Stepping at [employer] - 1 x daily - 7 x weekly - 3 sets - 10 reps Standing Hip Abduction Adduction at Pool Wall - 1 x daily - 7 x weekly - 3 sets - 10 reps Standing Hip Flexion Extension at [employer] - 1 x daily - 7 x weekly - 3 sets - 10 reps Squat - 1 x daily - 7 x weekly - 3 sets - 10 reps Lunge to Target at [employer] - 1 x daily - 7 x weekly - 3 sets - 10 reps Side Stepping - 1 x daily - 7 x weekly - 3 sets - 10 reps Standing Hip Circles at [employer] - 1 x daily - 7 x weekly - 3 sets - 10 reps Full Triangle Pose in [place]allow [place] with Pool Noodle - 1 x daily - 7 x weekly - 3 sets - 10 reps Cat Cow in [place]allow [place] with Pool Noodle - 1 x daily - 7 x weekly - 3 sets - 10 reps Figure 8 Arms with Partial Squat - 1 x daily - 7 x weekly - 3 sets - 10 reps Abdominal Curls Diagonal with Upper Extremity Flotation - 1 x daily - 7 x weekly - 3 sets - 10 reps Heel Toe Raises at Pool Wall - 1 x daily - 7 x weekly - 3 sets - 10 reps   Garen Lah, PT, ATRIC Certified Exercise Expert for the Aging Adult  09/28/21 3:10 PM Phone: [phone] Fax: [phone]

## 2021-09-28 NOTE — Therapy (Signed)
[place], [place], [address] Phone: [phone]   Fax:  [phone]  Physical Therapy Treatment  Patient Details  Name: Katie [name] MRN: [id number] Date of Birth: Jan 05, 1966 Referring Provider (PT): Ashok Pall MD   Encounter Date: 09/28/2021   PT End of Session - 09/28/21 1511     Visit Number 9    Number of Visits 16    Date for PT Re-Evaluation 10/17/21    Authorization Type healthy Blue  no TX, esitm , vaso or ionto    PT Start Time 1400    PT Stop Time 1505    PT Time Calculation (min) 65 min    Activity Tolerance Patient tolerated treatment well    Behavior During Therapy [facility] for tasks assessed/performed             Past Medical History:  Diagnosis Date   Anxiety    Back pain    Bipolar 1 disorder ([place])    Chronic pain entered 08/15/2015   Treated with Oxycodone   Depression    Depression    Dyspnea    History of acute bronchitis    HTN (hypertension)    OA (osteoarthritis)    Obesity    PE (pulmonary embolism)    oct 2014   Sciatica     Past Surgical History:  Procedure Laterality Date   ADENOIDECTOMY     BIOPSY STOMACH     CARDIAC CATHETERIZATION  2009   normal; in [place]  2002   x 2, 1997 and 2002   CHOLECYSTECTOMY  2007   KNEE SURGERY Right 2005   TONSILLECTOMY     TOTAL KNEE ARTHROPLASTY Left 01/27/2020   Procedure: TOTAL KNEE ARTHROPLASTY;  Surgeon: Renette Butters, MD;  Location: [facility];  Service: Orthopedics;  Laterality: Left;   TYMPANOSTOMY TUBE PLACEMENT      There were no vitals filed for this visit.   Subjective Assessment - 09/28/21 1506     Subjective Pt enters pool with cane and walking with minimal antalgic gait. after aquatic session pt reported 2/10 after aquastretch    Pertinent History L TKA 01-26-21  DDD cardiac cath 2009  , Sciatica, RTC syndrome, plantar fasciitis, carpal tunnel.  OA, obesity,  See medical  History, ALLERGIC to Percocet, Hydrocodone,Vicodin, Aleve    Diagnostic tests MRIAt C4-C5, posterior disc osteophyte complex contacts and mildly  flattens the ventral cord without significant canal stenosis. Mild  left foraminal stenosis at this level. Findings are similar to the  prior.  2. At C5-C6, new small central disc protrusion contacts the ventral  cord without cord deformation or significant canal stenosis.  3. Normal cord signal without enhancement.THORACIC. Since the 2017 prior there is new mild (10-20%) height loss of  the T1 and T3 vertebral bodies, suggestive of mild compression  fractures. There is mild marrow edema along the right T3 superior  endplate and bilateral T3 pedicles. It is unclear if this represents  recent or unhealed fractures or degenerative/stress related change.  Recommend correlation with the presence or absence of point  tenderness and any history of recent trauma.  2. Degenerative disc height loss at T9-T10, similar to prior. No  evidence of significant canal stenosis or foraminal impingement.  3. Normal cord signal without enhancement.    Patient Stated Goals Pt would like to be able to walk without fear of falling.  and not holding onto walls,  would like  to shop for groceries without dependence on cart.  play with son on floor.  do household chores without excruciating pain.  and shop forgroceries without having to ride on motorized cart or hanging onto shopping cart    Currently in Pain? Yes    Pain Score 6     Pain Location Generalized    Pain Orientation Right    Pain Descriptors / Indicators Sore    Pain Type Chronic pain    Pain Onset More than a month ago    Pain Score 6    Pain Location Hip    Pain Orientation Right    Pain Descriptors / Indicators Radiating                Aquatic therapy at [address] - therapeutic pool temp 93 degrees Pt enters building with cane. Treatment took place in water 3.8 to  4 ft 8 in.feet deep  depending upon activity.  Pt entered and exited the pool via stair and handrails independently.  Pt with minimal antalgic gait    [name] entered water for aquatic therapy for 3rd time  at [facility].   Pt with sore quadriceps from clinic day before and radiating pain down R buttock and R thigh.  Pt received Aquastretch for hip flexor and LAD of R LE.  Pt also with aquastretch of bil quadriceps by PT and then showed alternative stretch using pool wall for added quad stretch while holding onto pool noodle for balance Pt utilized pool noodle to stretch hamstring and engage PPT as well as hip flexors while facing pool wall with use of pool noodle for independent stretching  . On edge of pool with bil UE support  Pt performed LE exercise Hip hinge against pool wall. Hip abd/add R/L 20 x each and then using 1 UE support Hip ext/flex with knee straight x 20, pt needing VC and TC for correct execution and sequencing Marching knee/hip 90/90 x 10 and then with added  yellow pool noodle for increased resistance x 10 each leg ham curl R/L x 20.  Squats x 20 reps with intermittent UE support x 2 sets. Lunge using pool noodle for balance with 15 x on R and then L Triangle pose with noodle Cat camel with noodle Bil heel raise Figure 4 squat stretch with UE support for R and L x 60 sec each   Bil ankle heel raise on step x 12   Working with SLS  with unweighted leg flexion and then extension while maintaining balance and turbulence in water on R and then [place]  HEP with demo and encouraged  deep breathing and pain control using slow controlled Tai chi like movements with shoulders submerged.  Emphasis on shoulder movement and abdominal engagement .   Ai Chi   posture of contemplating to floating then uplifting to enclosing,  Folding, soothing gathering and ending with freeing motion.  All 8-10 x each - cues given to keep core tight for improved trunk stabilization; Utilization of  hip  ER and IR dynamic with Shifting, accepting, accepting with Shirlee [place] rounding, Balancing  flowing, reflecting  and suspending. posture 8-10 reps each with pt maintaining balance with turbulence in water   Supine abdominal hollowing with VC to keep hips up and feet up with barbells in UE submerged as much as possible in 5 minutes  Pt fatigued and rested.  Worked on prone aquatic barbell submerged for abdominal engagement  Combo prone to supine with legs extended for 3 minutes to encourage active abdominal engagement.  Tuck jumps with barbells submerged for abdominal external and internal oblique   Pt awas able to ambulate across pool  forward , sideways and backwards for 12 minutes continuous stepping.    She was able to use  yellow aquatic barbells while ambulating to increase abdominal engagement with exercises as well as red weights (2.5 #)to add resistance  Ended with water jogging in water for 4 minutes to encourage endurance.       Pt requires the buoyancy of water for active assisted exercises with buoyancy supported for strengthening and AROM exercises: PT  requires the viscosity of the water for resistance with strengthening exercises as well as balance Hydrostatic pressure also supports joints by unweighting joint load by at least 50 % in 3-4 feet depth water. 80% in chest to neck deep water. Water will allow for  reduced joint loading through buoyancy to help patient improve posture without excess stress and pain.                  Access Code: [email or web address] URL: [email or web address] Date: 09/27/2021 Prepared by: Voncille Lo  Exercises Standing March at [facility] - 1 x daily - 7 x weekly - 3 sets - 10 reps Lateral Stepping at [employer] - 1 x daily - 7 x weekly - 3 sets - 10 reps Standing Hip Abduction Adduction at [place] - 1 x daily - 7 x weekly - 3 sets - 10 reps Standing Hip Flexion Extension at [employer] - 1 x daily - 7 x weekly - 3 sets - 10  reps Squat - 1 x daily - 7 x weekly - 3 sets - 10 reps Lunge to Target at [employer] - 1 x daily - 7 x weekly - 3 sets - 10 reps Side Stepping - 1 x daily - 7 x weekly - 3 sets - 10 reps Standing Hip Circles at [employer] - 1 x daily - 7 x weekly - 3 sets - 10 reps Full Triangle Pose in Shallow Water with Pool Noodle - 1 x daily - 7 x weekly - 3 sets - 10 reps Cat Cow in Shallow Water with Pool Noodle - 1 x daily - 7 x weekly - 3 sets - 10 reps Figure 8 Arms with Partial Squat - 1 x daily - 7 x weekly - 3 sets - 10 reps Abdominal Curls Diagonal with Upper Extremity Flotation - 1 x daily - 7 x weekly - 3 sets - 10 reps Heel Toe Raises at [place] - 1 x daily - 7 x weekly - 3 sets - 10 reps         PT Education - 09/28/21 1509     Education Details Pt given written HEP for Ai chi moves and reinforced HEP already given    Person(s) Educated Patient    Methods Explanation;Demonstration;Tactile cues;Verbal cues;Handout    Comprehension Verbalized understanding;Returned demonstration              PT Short Term Goals - 09/20/21 1533       PT SHORT TERM GOAL #1   Title Independent with initial HEP since hospitalization    Baseline able to do intiial supine exercise  moving on to advanced    Time 3    Period Weeks    Status Achieved      PT SHORT TERM GOAL #2   Title Pt  will be able to perform 6 minute walk test with 1809 feet    Baseline 6MWT  1120 feet with cane    Time 3    Period Weeks    Target Date 09/26/21      PT SHORT TERM GOAL #3   Title Pt will be able to perform sit to stand with proper form without using momentum and compensations    Baseline 5 x STS 68.9 sec with compensations  09-05-21 24.12.  5 x sts 12.06 sec    Time 3    Period Weeks    Status Achieved    Target Date 09/26/21      PT SHORT TERM GOAL #4   Title Pt will be offered opportunity for aquatic therapy to advance current program she is using on her own    Baseline scheduled for 2-3 additional  aquatic sessions before joining the [employer]    Time 3    Period Weeks    Status Achieved    Target Date 09/26/21               PT Long Term Goals - 09/27/21 1605       PT LONG TERM GOAL #1   Title Pt will be independent with advanced HEP given    Baseline working on advanced and balance    Time 6    Period Weeks    Status On-going      PT LONG TERM GOAL #2   Title Pt will be able to negotiate steps without exacerbation of pain greater than 2/10  and LRAD    Baseline Pt uses cane and 3/10 pain and step over step with LRAD    Time 6    Period Weeks    Status Achieved      PT LONG TERM GOAL #3   Title Pt will increase neck AROM 10 degreees bil to increase abilty to turn neck for ambulation and visual scanning    Baseline AROM R rot 39, L rot 35   R rot to 60, Lrotation to 55    Time 6    Period Weeks    Status Achieved      PT LONG TERM GOAL #4   Title Pt will be able to perform standing to floor transfer in order to play with kids and to ensure safety in home    Baseline Pt working on lunging and transfers , must use hands and chair to descend    Time 6    Period Weeks    Status On-going      PT LONG TERM GOAL #5   Title PT with be able to walk/stand >/= 30 min with no AD with </= 3/10 pain for functional endurance and return to shopping/leisure activities    Baseline DGI 21/24 with cane    Time 6    Period Weeks    Status On-going      PT LONG TERM GOAL #6   Title Pt will incorporate regular land based exercise in order to maintain strength for ADL/household tasks    Baseline Pt planning to join [employer]    Time 6    Period Weeks    Status On-going      PT LONG TERM GOAL #7   Title increase BERG balance  to >/= 50/56 to demo improvement in balance and safety    Baseline DGI 21/24  pt having problems with single limb strength but uses cane for adequate score  Time 6    Period Weeks    Status Achieved                   Plan - 09/28/21 1511      Clinical Impression Statement Mrs Banner enters pool with 6/10 pain generally and 6/10 in R hip/butttock radiating.  Pt was able to reduce to 2/10 at end of session using LAD , Aquastretch and assisted stretch.  Pt was able to use pain manangement strategy in pool to reduce pain and gain AROM in LE .Pt requires the buoyancy of water for active assisted exercises with buoyancy supported for strengthening and AROM exercises: PT  requires the viscosity of the water for resistance with strengthening exercises as well as balance  Hydrostatic pressure also supports joints by unweighting joint load by at least 50 % in 3-4 feet depth water. 80% in chest to neck deep water.  Water will allow for  reduced joint loading through buoyancy to help patient improve posture without excess stress and pain.    Personal Factors and Comorbidities Comorbidity 1;Comorbidity 2    Comorbidities L TKA 01-26-21  DDD cardiac cath 2009  , Sciatica, RTC syndrome, plantar fasciitis, carpal tunnel.  OA, obesity,  See medical History, ALLERGIC to Percocet, Hydrocodone,Vicodin, Aleve    Examination-Activity Limitations Squat;Stand;Stairs;Bend;Carry;Lift;Locomotion Level;Reach Overhead;Transfers    Examination-Participation Restrictions Meal Prep    PT Frequency 2x / week    PT Duration 6 weeks    PT Treatment/Interventions ADLs/Self Care Home Management;Aquatic Therapy;Cryotherapy;Moist Heat;Balance training;Therapeutic exercise;Therapeutic activities;Functional mobility training;Stair training;Gait training;Neuromuscular re-education;Patient/family education;Passive range of motion;Manual techniques;Dry needling;Taping;Joint Manipulations;Vestibular    PT Next Visit Plan essential strength for gym    PT Home Exercise Plan [id number] - sit to stand, heel raise, chin tuck, scapular retraction, upper trap/ levator scapulae stretch, log roll [id number] for aquatics    Consulted and Agree with Plan of Care Patient             Patient will  benefit from skilled therapeutic intervention in order to improve the following deficits and impairments:  Pain, Obesity, Postural dysfunction, Improper body mechanics, Difficulty walking, Decreased mobility, Decreased endurance, Decreased range of motion, Decreased strength, Hypomobility, Increased muscle spasms, Impaired flexibility, Impaired UE functional use, Decreased activity tolerance, Decreased balance  Visit Diagnosis: No diagnosis found.     Problem List Patient Active Problem List   Diagnosis Date Noted   Allergic reaction caused by a drug 07/26/2021   Primary osteoarthritis of left knee 12/29/2019   Morbid obesity ([place])    Bradycardia    Pulmonary embolism on long-term anticoagulation therapy ([place]) 04/06/2016   Pain in the chest    Chronic pain    Chest wall pain    Pulmonary embolism ([place]) 07/31/2013   Vocal cord dysfunction 02/28/2012   Dyspnea 01/17/2012   Chest pain 11/25/2011   Hypokalemia 11/25/2011   GERD (gastroesophageal reflux disease) 03/08/2011   DYSMENORRHEA 11/10/2010   PLANTAR FASCIITIS 09/14/2010   OSTEOARTHRITIS, KNEES, BILATERAL, SEVERE 06/22/2010   ROTATOR CUFF SYNDROME, RIGHT 04/11/2010   VITAMIN D DEFICIENCY 02/18/2010   Bipolar 1 disorder ([place]) 01/17/2010   Essential hypertension, benign 01/17/2010   INSOMNIA 01/17/2010   OBESITY, NOS 01/03/2007   Depression 01/03/2007   HEARING LOSS NOS OR DEAFNESS 01/03/2007   EDEMA-LEGS,DUE TO VENOUS OBSTRUCT. 01/03/2007   Irritable bowel syndrome 01/03/2007   Voncille Lo, PT, [place] Certified Exercise Expert for the Aging Adult  09/28/21 3:26 PM Phone: [phone] Fax: [facility] [address] [place], [place], [address] Phone: [phone]   Fax:  [phone]  Name: Katie [name] MRN: [id number] Date of Birth: 1966/10/09

## 2021-09-29 DIAGNOSIS — Z96642 Presence of left artificial hip joint: Secondary | ICD-10-CM | POA: Diagnosis not present

## 2021-09-30 DIAGNOSIS — Z96642 Presence of left artificial hip joint: Secondary | ICD-10-CM | POA: Diagnosis not present

## 2021-10-01 DIAGNOSIS — Z96642 Presence of left artificial hip joint: Secondary | ICD-10-CM | POA: Diagnosis not present

## 2021-10-02 DIAGNOSIS — Z96642 Presence of left artificial hip joint: Secondary | ICD-10-CM | POA: Diagnosis not present

## 2021-10-03 DIAGNOSIS — Z96642 Presence of left artificial hip joint: Secondary | ICD-10-CM | POA: Diagnosis not present

## 2021-10-04 ENCOUNTER — Encounter: Payer: Self-pay | Admitting: Physical Therapy

## 2021-10-04 ENCOUNTER — Ambulatory Visit: Payer: Medicaid Other | Admitting: Physical Therapy

## 2021-10-04 ENCOUNTER — Other Ambulatory Visit: Payer: Self-pay

## 2021-10-04 DIAGNOSIS — R2681 Unsteadiness on feet: Secondary | ICD-10-CM

## 2021-10-04 DIAGNOSIS — M6281 Muscle weakness (generalized): Secondary | ICD-10-CM

## 2021-10-04 DIAGNOSIS — R262 Difficulty in walking, not elsewhere classified: Secondary | ICD-10-CM

## 2021-10-04 DIAGNOSIS — R296 Repeated falls: Secondary | ICD-10-CM

## 2021-10-04 DIAGNOSIS — M542 Cervicalgia: Secondary | ICD-10-CM

## 2021-10-04 DIAGNOSIS — M25562 Pain in left knee: Secondary | ICD-10-CM

## 2021-10-04 DIAGNOSIS — R252 Cramp and spasm: Secondary | ICD-10-CM

## 2021-10-04 DIAGNOSIS — Z96642 Presence of left artificial hip joint: Secondary | ICD-10-CM | POA: Diagnosis not present

## 2021-10-04 DIAGNOSIS — M25662 Stiffness of left knee, not elsewhere classified: Secondary | ICD-10-CM

## 2021-10-04 NOTE — Patient Instructions (Signed)
        Garen Lah, PT, [facility] Certified Exercise Expert for the Aging Adult  10/04/21 4:37 PM Phone: [phone] Fax: [phone]

## 2021-10-04 NOTE — Therapy (Signed)
[facility] [address] [place], [place], [address] Phone: [phone]   Fax:  [phone]  Physical Therapy Treatment  Patient Details  Name: Katie [name] MRN: [id number] Date of Birth: Feb 07, 1966 Referring Provider (PT): Coletta Memos MD   Encounter Date: 10/04/2021   PT End of Session - 10/04/21 1644     Visit Number 10    Number of Visits 16    Date for PT Re-Evaluation 10/17/21    Authorization Type healthy Blue  no TX, esitm , vaso or ionto    PT Start Time 1546    PT Stop Time 1632    PT Time Calculation (min) 46 min    Activity Tolerance Patient tolerated treatment well    Behavior During Therapy [facility] for tasks assessed/performed             Past Medical History:  Diagnosis Date   Anxiety    Back pain    Bipolar 1 disorder (HCC)    Chronic pain entered 08/15/2015   Treated with Oxycodone   Depression    Depression    Dyspnea    History of acute bronchitis    HTN (hypertension)    OA (osteoarthritis)    Obesity    PE (pulmonary embolism)    oct 2014   Sciatica     Past Surgical History:  Procedure Laterality Date   ADENOIDECTOMY     BIOPSY STOMACH     CARDIAC CATHETERIZATION  2009   normal; in [place]   CESAREAN SECTION  2002   x 2, 1997 and 2002   CHOLECYSTECTOMY  2007   KNEE SURGERY Right 2005   TONSILLECTOMY     TOTAL KNEE ARTHROPLASTY Left 01/27/2020   Procedure: TOTAL KNEE ARTHROPLASTY;  Surgeon: [name], MD;  Location: [facility];  Service: Orthopedics;  Laterality: Left;   TYMPANOSTOMY TUBE PLACEMENT      There were no vitals filed for this visit.   Subjective Assessment - 10/04/21 1552     Pertinent History L TKA 01-26-21  DDD cardiac cath 2009  , Sciatica, RTC syndrome, plantar fasciitis, carpal tunnel.  OA, obesity,  See medical History, ALLERGIC to Percocet, Hydrocodone,Vicodin, Aleve    How long can you walk comfortably? cant find any shopping carts at Thanksgiving     Diagnostic tests MRIAt C4-C5, posterior disc osteophyte complex contacts and mildly  flattens the ventral cord without significant canal stenosis. Mild  left foraminal stenosis at this level. Findings are similar to the  prior.  2. At C5-C6, new small central disc protrusion contacts the ventral  cord without cord deformation or significant canal stenosis.  3. Normal cord signal without enhancement.THORACIC. Since the 2017 prior there is new mild (10-20%) height loss of  the T1 and T3 vertebral bodies, suggestive of mild compression  fractures. There is mild marrow edema along the right T3 superior  endplate and bilateral T3 pedicles. It is unclear if this represents  recent or unhealed fractures or degenerative/stress related change.  Recommend correlation with the presence or absence of point  tenderness and any history of recent trauma.  2. Degenerative disc height loss at T9-T10, similar to prior. No  evidence of significant canal stenosis or foraminal impingement.  3. Normal cord signal without enhancement.    Currently in Pain? Yes    Pain Score 4     Pain Location Generalized    Pain Orientation Right    Pain Descriptors / Indicators Sore  [facility] Adult PT Treatment/Exercise:   Therapeutic Exercise:                                                                                                                                                                                    Elliptical RPE 5/6 for 5 min max before fatigue Hip hinge with dowel rod and then without dowel as guide 3 x 10 before deadlifting preparation Wall sit 10 x 10 sec Step Up -3 sets - 10 reps  on R and on L Forward Step Down  on 2 inch step-  1 sets - 10 reps on left and Right and then on 4 inch step R and L  for 10 reps Heel raise 10 x 2 bil  and then 1 x 10 SLS on R and then L Standing Hip Hinge with Dowel - 1 x daily - 7 x weekly - 10 reps - 3 sets  Goblet Squat with Kettlebell 2 x 10 reps with 25#  KB rest between 2 minutes.  On 3rd rep not squatting as much and needed VC and TC last set with 6 max due to fatigue Kettlebell Deadlift - Kettle bell ladder 6 reps of 15#KB, 6 reps of 25#KB and then 6 reps of 45# KB Farmer's Carry with Kettlebells - 15 # KB in R hand and 25# KB in L hand walking 197ft x 2        Manual Therapy:       Neuromuscular re-ed:    Therapeutic Activity: N/A   Modalities: N/A   Self Care:             N/A    Not performed today:          added to HEP for essential strength  Access Code: [email or web address]: [email or web address]: 11/29/2022Prepared by: Wayland Denis BeardsleyExercises   Standing Hip Hinge with Dowel - 1 x daily - 7 x weekly - 10 reps - 3 sets  Goblet Squat with Kettlebell - 1 x daily - 7 x weekly - 10 reps - 3 sets  Kettlebell Deadlift - 1 x daily - 7 x weekly - 3 sets - 6-8 reps  Farmer's Carry with Kettlebells - 1 x daily - 7 x weekly - 2 sets - 8 reps              PT Education - 10/04/21 1637     Education Details Pt given HEP with essential strength for the gym  deadlift, goblet squat, farmers carry and hip hinge    Person(s) Educated Patient    Methods Explanation;Demonstration;Tactile cues;Verbal cues;Handout    Comprehension Verbalized understanding;Returned demonstration  PT Short Term Goals - 09/20/21 1533       PT SHORT TERM GOAL #1   Title Independent with initial HEP since hospitalization    Baseline able to do intiial supine exercise  moving on to advanced    Time 3    Period Weeks    Status Achieved      PT SHORT TERM GOAL #2   Title Pt will be able to perform 6 minute walk test with 1809 feet    Baseline  1120 feet with cane    Time 3    Period Weeks    Target Date 09/26/21      PT SHORT TERM GOAL #3   Title Pt will be able to perform sit to stand with proper form without using momentum and compensations    Baseline 5 x STS 68.9 sec with compensations   09-05-21 24.12.  5 x sts 12.06 sec    Time 3    Period Weeks    Status Achieved    Target Date 09/26/21      PT SHORT TERM GOAL #4   Title Pt will be offered opportunity for aquatic therapy to advance current program she is using on her own    Baseline scheduled for 2-3 additional aquatic sessions before joining the [employer]    Time 3    Period Weeks    Status Achieved    Target Date 09/26/21               PT Long Term Goals - 09/27/21 1605       PT LONG TERM GOAL #1   Title Pt will be independent with advanced HEP given    Baseline working on advanced and balance    Time 6    Period Weeks    Status On-going      PT LONG TERM GOAL #2   Title Pt will be able to negotiate steps without exacerbation of pain greater than 2/10  and LRAD    Baseline Pt uses cane and 3/10 pain and step over step with LRAD    Time 6    Period Weeks    Status Achieved      PT LONG TERM GOAL #3   Title Pt will increase neck AROM 10 degreees bil to increase abilty to turn neck for ambulation and visual scanning    Baseline AROM R rot 39, L rot 35   R rot to 60, Lrotation to 55    Time 6    Period Weeks    Status Achieved      PT LONG TERM GOAL #4   Title Pt will be able to perform standing to floor transfer in order to play with kids and to ensure safety in home    Baseline Pt working on lunging and transfers , must use hands and chair to descend    Time 6    Period Weeks    Status On-going      PT LONG TERM GOAL #5   Title PT with be able to walk/stand >/= 30 min with no AD with </= 3/10 pain for functional endurance and return to shopping/leisure activities    Baseline DGI 21/24 with cane    Time 6    Period Weeks    Status On-going      PT LONG TERM GOAL #6   Title Pt will incorporate regular land based exercise in order to maintain strength for ADL/household tasks  Baseline Pt planning to join [employer]    Time 6    Period Weeks    Status On-going      PT LONG TERM GOAL #7   Title  increase BERG balance  to >/= 50/56 to demo improvement in balance and safety    Baseline DGI 21/24  pt having problems with single limb strength but uses cane for adequate score    Time 6    Period Weeks    Status Achieved                   Plan - 10/04/21 1548     Clinical Impression Statement Ms Vazguez enters clinic with 4/10 pain generalized pain.  Pt was interested in learning basic strength for gym and would like to go to the gym after DC with a trainer to continue strengthening.  Pt worked on basics of lunge, goblet squat , deadlift and farmers carry. Pt with R TKA with limited Flexion but able to lunge with slight trunk lean to compensate. Pt able to goblet squat 25 # KB and able to deadlift maximally 45#. Will continue to do aquatics and land exercise in preparation for DC    Personal Factors and Comorbidities Comorbidity 1;Comorbidity 2    Comorbidities L TKA 01-26-21  DDD cardiac cath 2009  , Sciatica, RTC syndrome, plantar fasciitis, carpal tunnel.  OA, obesity,  See medical History, ALLERGIC to Percocet, Hydrocodone,Vicodin, Aleve    Examination-Activity Limitations Squat;Stand;Stairs;Bend;Carry;Lift;Locomotion Level;Reach Overhead;Transfers    Rehab Potential Good    PT Frequency 2x / week    PT Duration 6 weeks    PT Treatment/Interventions ADLs/Self Care Home Management;Aquatic Therapy;Cryotherapy;Moist Heat;Balance training;Therapeutic exercise;Therapeutic activities;Functional mobility training;Stair training;Gait training;Neuromuscular re-education;Patient/family education;Passive range of motion;Manual techniques;Dry needling;Taping;Joint Manipulations;Vestibular    PT Next Visit Plan essential strength for gym    PT Home Exercise Plan [email or web address] - sit to stand, heel raise, chin tuck, scapular retraction, upper trap/ levator scapulae stretch, log roll [id number] for aquatics    Consulted and Agree with Plan of Care Patient             Patient will benefit from  skilled therapeutic intervention in order to improve the following deficits and impairments:  Pain, Obesity, Postural dysfunction, Improper body mechanics, Difficulty walking, Decreased mobility, Decreased endurance, Decreased range of motion, Decreased strength, Hypomobility, Increased muscle spasms, Impaired flexibility, Impaired UE functional use, Decreased activity tolerance, Decreased balance  Visit Diagnosis: Cervicalgia  Unsteadiness on feet  Muscle weakness (generalized)  Cramp and spasm  Acute pain of left knee  Stiffness of left knee, not elsewhere classified  Repeated falls  Difficulty in walking, not elsewhere classified     Problem List Patient Active Problem List   Diagnosis Date Noted   Allergic reaction caused by a drug 07/26/2021   Primary osteoarthritis of left knee 12/29/2019   Morbid obesity (HCC)    Bradycardia    Pulmonary embolism on long-term anticoagulation therapy (HCC) 04/06/2016   Pain in the chest    Chronic pain    Chest wall pain    Pulmonary embolism (HCC) 07/31/2013   Vocal cord dysfunction 02/28/2012   Dyspnea 01/17/2012   Chest pain 11/25/2011   Hypokalemia 11/25/2011   GERD (gastroesophageal reflux disease) 03/08/2011   DYSMENORRHEA 11/10/2010   PLANTAR FASCIITIS 09/14/2010   OSTEOARTHRITIS, KNEES, BILATERAL, SEVERE 06/22/2010   ROTATOR CUFF SYNDROME, RIGHT 04/11/2010   VITAMIN D DEFICIENCY 02/18/2010   Bipolar 1 disorder (HCC) 01/17/2010   Essential hypertension, benign  01/17/2010   INSOMNIA 01/17/2010   OBESITY, NOS 01/03/2007   Depression 01/03/2007   HEARING LOSS NOS OR DEAFNESS 01/03/2007   EDEMA-LEGS,DUE TO VENOUS OBSTRUCT. 01/03/2007   Irritable bowel syndrome 01/03/2007   Garen Lah, PT, ATRIC Certified Exercise Expert for the Aging Adult  10/04/21 4:51 PM Phone: [phone] Fax: [phone]   [facility] [address] [place], [place], [address] Phone:  [phone]   Fax:  [phone]  Name: Katie [name] MRN: [id number] Date of Birth: 1966/07/23

## 2021-10-05 ENCOUNTER — Ambulatory Visit: Payer: Medicaid Other | Admitting: Physical Therapy

## 2021-10-05 DIAGNOSIS — Z96642 Presence of left artificial hip joint: Secondary | ICD-10-CM | POA: Diagnosis not present

## 2021-10-06 DIAGNOSIS — Z96642 Presence of left artificial hip joint: Secondary | ICD-10-CM | POA: Diagnosis not present

## 2021-10-07 DIAGNOSIS — Z96642 Presence of left artificial hip joint: Secondary | ICD-10-CM | POA: Diagnosis not present

## 2021-10-08 DIAGNOSIS — Z96642 Presence of left artificial hip joint: Secondary | ICD-10-CM | POA: Diagnosis not present

## 2021-10-09 DIAGNOSIS — Z96642 Presence of left artificial hip joint: Secondary | ICD-10-CM | POA: Diagnosis not present

## 2021-10-10 DIAGNOSIS — Z96642 Presence of left artificial hip joint: Secondary | ICD-10-CM | POA: Diagnosis not present

## 2021-10-11 ENCOUNTER — Ambulatory Visit: Payer: Medicaid Other | Attending: Neurosurgery | Admitting: Physical Therapy

## 2021-10-11 ENCOUNTER — Other Ambulatory Visit: Payer: Self-pay

## 2021-10-11 ENCOUNTER — Encounter: Payer: Self-pay | Admitting: Physical Therapy

## 2021-10-11 DIAGNOSIS — M542 Cervicalgia: Secondary | ICD-10-CM | POA: Diagnosis present

## 2021-10-11 DIAGNOSIS — R262 Difficulty in walking, not elsewhere classified: Secondary | ICD-10-CM | POA: Diagnosis present

## 2021-10-11 DIAGNOSIS — R2681 Unsteadiness on feet: Secondary | ICD-10-CM | POA: Diagnosis present

## 2021-10-11 DIAGNOSIS — M25662 Stiffness of left knee, not elsewhere classified: Secondary | ICD-10-CM | POA: Insufficient documentation

## 2021-10-11 DIAGNOSIS — R296 Repeated falls: Secondary | ICD-10-CM | POA: Diagnosis present

## 2021-10-11 DIAGNOSIS — M25562 Pain in left knee: Secondary | ICD-10-CM | POA: Diagnosis present

## 2021-10-11 DIAGNOSIS — R252 Cramp and spasm: Secondary | ICD-10-CM | POA: Diagnosis present

## 2021-10-11 DIAGNOSIS — M6281 Muscle weakness (generalized): Secondary | ICD-10-CM | POA: Diagnosis present

## 2021-10-11 DIAGNOSIS — Z96642 Presence of left artificial hip joint: Secondary | ICD-10-CM | POA: Diagnosis not present

## 2021-10-11 NOTE — Therapy (Signed)
[facility] [address] [place], [place], [address] Phone: [phone]   Fax:  [phone]  Physical Therapy Treatment  Patient Details  Name: Katie [name] MRN: [id number] Date of Birth: April 08, 1966 Referring Provider (PT): Coletta Memos MD   Encounter Date: 10/11/2021   PT End of Session - 10/11/21 1513     Visit Number 11    Number of Visits 16    Date for PT Re-Evaluation 10/17/21    Authorization Type healthy Blue  no TX, esitm , vaso or ionto    PT Start Time 1507    PT Stop Time 1547    PT Time Calculation (min) 40 min    Activity Tolerance Patient tolerated treatment well    Behavior During Therapy [facility] for tasks assessed/performed             Past Medical History:  Diagnosis Date   Anxiety    Back pain    Bipolar 1 disorder (HCC)    Chronic pain entered 08/15/2015   Treated with Oxycodone   Depression    Depression    Dyspnea    History of acute bronchitis    HTN (hypertension)    OA (osteoarthritis)    Obesity    PE (pulmonary embolism)    oct 2014   Sciatica     Past Surgical History:  Procedure Laterality Date   ADENOIDECTOMY     BIOPSY STOMACH     CARDIAC CATHETERIZATION  2009   normal; in [place]   CESAREAN SECTION  2002   x 2, 1997 and 2002   CHOLECYSTECTOMY  2007   KNEE SURGERY Right 2005   TONSILLECTOMY     TOTAL KNEE ARTHROPLASTY Left 01/27/2020   Procedure: TOTAL KNEE ARTHROPLASTY;  Surgeon: [name], MD;  Location: [facility];  Service: Orthopedics;  Laterality: Left;   TYMPANOSTOMY TUBE PLACEMENT      There were no vitals filed for this visit.   Subjective Assessment - 10/11/21 1509     Subjective I took my KT tape off on Tuesday and I feel like my Left knee felt like it was tightening up.  I had the flu last week and I rested and I went to the MD and got Tamiflu    Pertinent History L TKA 01-26-21  DDD cardiac cath 2009  , Sciatica, RTC syndrome, plantar fasciitis,  carpal tunnel.  OA, obesity,  See medical History, ALLERGIC to Percocet, Hydrocodone,Vicodin, Aleve    How long can you sit comfortably? sitting for 30 min for a show but need to shift    How long can you stand comfortably? 15 -  20 min and then pt tries to lean on cart to shop    Diagnostic tests MRIAt C4-C5, posterior disc osteophyte complex contacts and mildly  flattens the ventral cord without significant canal stenosis. Mild  left foraminal stenosis at this level. Findings are similar to the  prior.  2. At C5-C6, new small central disc protrusion contacts the ventral  cord without cord deformation or significant canal stenosis.  3. Normal cord signal without enhancement.THORACIC. Since the 2017 prior there is new mild (10-20%) height loss of  the T1 and T3 vertebral bodies, suggestive of mild compression  fractures. There is mild marrow edema along the right T3 superior  endplate and bilateral T3 pedicles. It is unclear if this represents  recent or unhealed fractures or degenerative/stress related change.  Recommend correlation with the presence or absence  of point  tenderness and any history of recent trauma.  2. Degenerative disc height loss at T9-T10, similar to prior. No  evidence of significant canal stenosis or foraminal impingement.  3. Normal cord signal without enhancement.    Patient Stated Goals Pt would like to be able to walk without fear of falling.  and not holding onto walls,  would like to shop for groceries without dependence on cart.  play with son on floor.  do household chores without excruciating pain.  and shop forgroceries without having to ride on motorized cart or hanging onto shopping cart    Currently in Pain? Yes    Pain Score 2     Pain Location Generalized    Pain Score 4    Pain Orientation Right    Pain Descriptors / Indicators Aching                [facility] PT Assessment - 10/11/21 0001       Assessment   Medical Diagnosis cervical spondylosis with  radiculopathy    Referring Provider (PT) Coletta Memos MD    Onset Date/Surgical Date 11/20/20      Functional Tests   Functional tests Sit to Stand;Floor to Stand      Single Leg Stance   Comments R 8 sec L 7sec      Sit to Stand   Comments 5 x sts 13.25      AROM   Cervical Flexion 42    Cervical Extension 50    Cervical - Right Side Bend 30    Cervical - Left Side Bend 38    Cervical - Right Rotation 60    Cervical - Left Rotation 55    Lumbar Flexion WFL    Lumbar Extension 25% limited    Lumbar - Right Side Bend WFL    Lumbar - Left Side Bend WFL    Lumbar - Right Rotation 25% limited    Lumbar - Left Rotation 25% limited      Strength   Right Hip Flexion 4+/5    Right Hip Extension 4/5    Right Hip ABduction 4-/5    Left Hip Flexion 4+/5    Left Hip Extension 4-/5    Left Hip ABduction 4/5    Right Knee Flexion 4+/5    Right Knee Extension 4+/5    Left Knee Flexion 4+/5    Left Knee Extension 4+/5    Right Ankle Dorsiflexion 4+/5    Right Ankle Plantar Flexion 4+/5    Left Ankle Dorsiflexion 4+/5    Left Ankle Plantar Flexion 4+/5             [facility] Adult PT Treatment/Exercise:   Therapeutic Exercise:  Deadlift with 45 # x 6  20 lb x 10 reps OH press Wall sit 10 x 10 sec Step Up -3 sets - 10 reps  on R and on L Forward step up x 10 8 inch step L and then R Heel raise 10 x 2 bil  and then 1 x 10 SLS on R and then L Goblet Squat with Kettlebell 2 x 10 reps with 25# KB     Manual Therapy:   Neuromuscular re-ed:     Therapeutic Activity:    Modalities: N/A   Self Care:   community wellness            Physcial Performance 1307 ft. Pt ability to rise and descend to the floor x 3 independently and be able to  Inch worm to the ground with PT supervising with  hands walking out. Pt required quadriped from prone from floor to rise to standing with 1/2 kneeling.  MMT testing/ functional strength -descend to ground with long plank                        PT Short Term Goals - 09/20/21 1533       PT SHORT TERM GOAL #1   Title Independent with initial HEP since hospitalization    Baseline able to do intiial supine exercise  moving on to advanced    Time 3    Period Weeks    Status Achieved      PT SHORT TERM GOAL #2   Title Pt will be able to perform 6 minute walk test with 1809 feet    Baseline  1120 feet with cane    Time 3    Period Weeks    Target Date 09/26/21      PT SHORT TERM GOAL #3   Title Pt will be able to perform sit to stand with proper form without using momentum and compensations    Baseline 5 x STS 68.9 sec with compensations  09-05-21 24.12.  5 x sts 12.06 sec    Time 3    Period Weeks    Status Achieved    Target Date 09/26/21      PT SHORT TERM GOAL #4   Title Pt will be offered opportunity for aquatic therapy to advance current program she is using on her own    Baseline scheduled for 2-3 additional aquatic sessions before joining the [employer]    Time 3    Period Weeks    Status Achieved    Target Date 09/26/21               PT Long Term Goals - 10/11/21 1524       PT LONG TERM GOAL #1   Title Pt will be independent with advanced HEP given    Baseline reviewed HEP and independent and going to gym for further strengthening    Time 6    Period Weeks    Status Achieved    Target Date 10/17/21      PT LONG TERM GOAL #2   Title Pt will be able to negotiate steps without exacerbation of pain greater than 2/10  and LRAD    Baseline Pt uses cane alternating with no increase in pain  today 2/10    Time 6    Period Weeks    Status Achieved    Target Date 10/17/21      PT LONG TERM GOAL #3  Title Pt will increase neck AROM 10 degreees bil to increase abilty to turn neck for  ambulation and visual scanning    Baseline AROM R rot 39, L rot 35   R rot to 60, Lrotation to 55    Time 6    Period Weeks    Status Achieved    Target Date 09/06/21      PT LONG TERM GOAL #4   Title Pt will be able to perform standing to floor transfer in order to play with kids and to ensure safety in home    Baseline Pt demo using a chair to get down on the floor independently    Time 6    Period Weeks    Status Achieved    Target Date 10/17/21      PT LONG TERM GOAL #5   Title PT with be able to walk/stand >/= 30 min with no AD with </= 3/10 pain for functional endurance and return to shopping/leisure activities    Baseline 1307 ft with no LRAD  improved from 1120 ft. with cane    Time 6    Period Weeks    Status Achieved    Target Date 10/17/21      PT LONG TERM GOAL #6   Title Pt will incorporate regular land based exercise in order to maintain strength for ADL/household tasks    Baseline Pt planning to join [employer] Pt utilzing exercise  and can exercise at gym with son    Time 6    Period Weeks    Status Achieved    Target Date 10/17/21      PT LONG TERM GOAL #7   Title increase BERG balance  to >/= 50/56 to demo improvement in balance and safety    Baseline DGI 21/24    Time 6    Period Weeks    Status Achieved    Target Date 10/17/21                   Plan - 10/11/21 1514     Clinical Impression Statement Pt enters clinic with 2/10 generalized pain and hip pain 4/10.  Pt has made remarkable improvements.  She now has 6 MWT 1307 ft and can rise from standing to ground independently. Pt has completed all goals and will continue strengthening at [facility] that she plans to join with a trainer and her son who does wt lifting.  She plans to continue aquatics at the [facility] with HEP.  Will have one more aquatic session to reinforce HEP forhome use and DC tomorrow.  Pt has increased with MMT and AROM. See flow sheet. Pt should continue to improve and be more active  for health as she becomes more involved inthe community.  He 5 x STS 13.25 sec and she is low risk of fall . She still has balance issues with SLS activity but she uses a cane as needed but does have abilty to not use cane as she did in the 6 MWT    Personal Factors and Comorbidities Comorbidity 1;Comorbidity 2    Comorbidities L TKA 01-26-21  DDD cardiac cath 2009  , Sciatica, RTC syndrome, plantar fasciitis, carpal tunnel.  OA, obesity,  See medical History, ALLERGIC to Percocet, Hydrocodone,Vicodin, Aleve    Examination-Activity Limitations Squat;Stand;Stairs;Bend;Carry;Lift;Locomotion Level;Reach Overhead;Transfers    Examination-Participation Restrictions Meal Prep    PT Frequency 2x / week    PT Duration 6 weeks    PT Treatment/Interventions ADLs/Self  Care Home Management;Aquatic Therapy;Cryotherapy;Moist Heat;Balance training;Therapeutic exercise;Therapeutic activities;Functional mobility training;Stair training;Gait training;Neuromuscular re-education;Patient/family education;Passive range of motion;Manual techniques;Dry needling;Taping;Joint Manipulations;Vestibular    PT Next Visit Plan essential strength for gym    PT Home Exercise Plan [id number] - sit to stand, heel raise, chin tuck, scapular retraction, upper trap/ levator scapulae stretch, log roll [id number] for aquatics    Consulted and Agree with Plan of Care Patient             Patient will benefit from skilled therapeutic intervention in order to improve the following deficits and impairments:  Pain, Obesity, Postural dysfunction, Improper body mechanics, Difficulty walking, Decreased mobility, Decreased endurance, Decreased range of motion, Decreased strength, Hypomobility, Increased muscle spasms, Impaired flexibility, Impaired UE functional use, Decreased activity tolerance, Decreased balance  Visit Diagnosis: Cervicalgia  Unsteadiness on feet  Muscle weakness (generalized)  Cramp and spasm  Acute pain of left  knee  Stiffness of left knee, not elsewhere classified  Repeated falls  Difficulty in walking, not elsewhere classified     Problem List Patient Active Problem List   Diagnosis Date Noted   Allergic reaction caused by a drug 07/26/2021   Primary osteoarthritis of left knee 12/29/2019   Morbid obesity (HCC)    Bradycardia    Pulmonary embolism on long-term anticoagulation therapy (HCC) 04/06/2016   Pain in the chest    Chronic pain    Chest wall pain    Pulmonary embolism (HCC) 07/31/2013   Vocal cord dysfunction 02/28/2012   Dyspnea 01/17/2012   Chest pain 11/25/2011   Hypokalemia 11/25/2011   GERD (gastroesophageal reflux disease) 03/08/2011   DYSMENORRHEA 11/10/2010   PLANTAR FASCIITIS 09/14/2010   OSTEOARTHRITIS, KNEES, BILATERAL, 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 01/03/2007   Irritable bowel syndrome 01/03/2007   Garen Lah, PT, ATRIC Certified Exercise Expert for the Aging Adult  10/11/21 5:08 PM Phone: [phone] Fax: [phone]   [facility] [address] [place], [place], [address] Phone: [phone]   Fax:  [phone]  Name: Katie [name] MRN: [id number] Date of Birth: Mar 24, 1966

## 2021-10-12 ENCOUNTER — Encounter: Payer: Self-pay | Admitting: Physical Therapy

## 2021-10-12 ENCOUNTER — Ambulatory Visit: Payer: Medicaid Other | Admitting: Physical Therapy

## 2021-10-12 DIAGNOSIS — M25662 Stiffness of left knee, not elsewhere classified: Secondary | ICD-10-CM

## 2021-10-12 DIAGNOSIS — R296 Repeated falls: Secondary | ICD-10-CM

## 2021-10-12 DIAGNOSIS — M542 Cervicalgia: Secondary | ICD-10-CM | POA: Diagnosis not present

## 2021-10-12 DIAGNOSIS — R2681 Unsteadiness on feet: Secondary | ICD-10-CM

## 2021-10-12 DIAGNOSIS — R252 Cramp and spasm: Secondary | ICD-10-CM

## 2021-10-12 DIAGNOSIS — R262 Difficulty in walking, not elsewhere classified: Secondary | ICD-10-CM

## 2021-10-12 DIAGNOSIS — Z96642 Presence of left artificial hip joint: Secondary | ICD-10-CM | POA: Diagnosis not present

## 2021-10-12 DIAGNOSIS — M25562 Pain in left knee: Secondary | ICD-10-CM

## 2021-10-12 DIAGNOSIS — M6281 Muscle weakness (generalized): Secondary | ICD-10-CM

## 2021-10-12 NOTE — Therapy (Addendum)
[place], [place], [address] Phone: [phone]   Fax:  [phone]  Physical Therapy Treatment/Discharge Note  Patient Details  Name: BRYNLEI KLAUSNER MRN: [id number] Date of Birth: September 15, 1966 Referring Provider (PT): Ashok Pall MD   Encounter Date: 10/12/2021   PT End of Session - 10/12/21 2051     Visit Number 12    Number of Visits 16    Date for PT Re-Evaluation 10/17/21    Authorization Type healthy Blue  no TX, esitm , vaso or ionto    PT Start Time 3094    PT Stop Time 1459    PT Time Calculation (min) 44 min    Activity Tolerance Patient tolerated treatment well    Behavior During Therapy [facility] for tasks assessed/performed             Past Medical History:  Diagnosis Date   Anxiety    Back pain    Bipolar 1 disorder ([place])    Chronic pain entered 08/15/2015   Treated with Oxycodone   Depression    Depression    Dyspnea    History of acute bronchitis    HTN (hypertension)    OA (osteoarthritis)    Obesity    PE (pulmonary embolism)    oct 2014   Sciatica     Past Surgical History:  Procedure Laterality Date   ADENOIDECTOMY     BIOPSY STOMACH     CARDIAC CATHETERIZATION  2009   normal; in [place]  2002   x 2, 1997 and 2002   CHOLECYSTECTOMY  2007   KNEE SURGERY Right 2005   TONSILLECTOMY     TOTAL KNEE ARTHROPLASTY Left 01/27/2020   Procedure: TOTAL KNEE ARTHROPLASTY;  Surgeon: Renette Butters, MD;  Location: [facility];  Service: Orthopedics;  Laterality: Left;   TYMPANOSTOMY TUBE PLACEMENT      There were no vitals filed for this visit.   Subjective Assessment - 10/12/21 1419     Subjective I slept much better yesterday after clinic.  Today I am a 3/10 pain general and in my hips too    Pertinent History L TKA 01-26-21  DDD cardiac cath 2009  , Sciatica, RTC syndrome, plantar fasciitis, carpal tunnel.  OA, obesity,  See medical History, ALLERGIC  to Percocet, Hydrocodone,Vicodin, Aleve    How long can you sit comfortably? sitting for 30 min for a show but need to shift    How long can you stand comfortably? 15 -  20 min and then pt tries to lean on cart to shop    How long can you walk comfortably? cant find any shopping carts at Thanksgiving    Diagnostic tests MRIAt C4-C5, posterior disc osteophyte complex contacts and mildly  flattens the ventral cord without significant canal stenosis. Mild  left foraminal stenosis at this level. Findings are similar to the  prior.  2. At C5-C6, new small central disc protrusion contacts the ventral  cord without cord deformation or significant canal stenosis.  3. Normal cord signal without enhancement.THORACIC. Since the 2017 prior there is new mild (10-20%) height loss of  the T1 and T3 vertebral bodies, suggestive of mild compression  fractures. There is mild marrow edema along the right T3 superior  endplate and bilateral T3 pedicles. It is unclear if this represents  recent or unhealed fractures or degenerative/stress related change.  Recommend correlation with the presence or absence of point  tenderness and any history of recent trauma.  2. Degenerative disc height loss at T9-T10, similar to prior. No  evidence of significant canal stenosis or foraminal impingement.  3. Normal cord signal without enhancement.    Patient Stated Goals Pt would like to be able to walk without fear of falling.  and not holding onto walls,  would like to shop for groceries without dependence on cart.  play with son on floor.  do household chores without excruciating pain.  and shop forgroceries without having to ride on motorized cart or hanging onto shopping cart    Currently in Pain? Yes    Pain Location Generalized    Pain Orientation Right    Pain Descriptors / Indicators Sore    Pain Type Chronic pain    Pain Onset More than a month ago    Pain Score 3    Pain Location Hip    Pain Orientation Right    Pain  Descriptors / Indicators Aching             Aquatic therapy at [address] - therapeutic pool temp 94 degrees Pt enters building with cane. Treatment took place in water 3.8 to  4 ft 8 in.feet deep depending upon activity.  Pt entered and exited the pool via stair and handrails independently.  Glade Lloyd SPT observed during RX today   Ms Esselman entered water for aquatic therapy for 4thtime  at [employer].   Pt utilized pool noodle to stretch hamstring and engage PPT as well as hip flexors while facing pool wall with use of pool noodle for independent stretching  . On edge of pool with bil UE support  Pt performed LE exercise Hip hinge against pool wall. Hip abd/add R/L 20 x each and then using 1 UE support Hip ext/flex with knee straight x 20, pt needing VC and TC for correct execution and sequencing Figure 4 squat stretch with UE support for R and L x 60 sec each      Working with SLS  with unweighted leg flexion and then extension while maintaining balance and turbulence in water on R and then L   Reviewed  Ai Chi  HEP with demo and encouraged  deep breathing and pain control using slow controlled Tai chi like movements with shoulders submerged.  Emphasis on shoulder movement and abdominal engagement .   Ai Chi   posture of contemplating to floating then uplifting to enclosing,  Folding, soothing gathering and ending with freeing motion.  All 8-10 x each - cues given to keep core tight for improved trunk stabilization; Utilization of  hip ER and IR dynamic with Shifting, accepting, accepting with Shirlee Limerick rounding, Balancing  flowing, reflecting  and suspending. posture 8-10 reps each with pt maintaining balance with turbulence in water. Glade Lloyd SPT also participated   [place], Pt with lumbar belt around hips and nek doodle for neck support.   Pt assisted into supine floating position by lying head on shoulder of PT to get into floating position. . PT at  torso and assisting with trunk left to right and vice versa to engage trunk muscles. PT then rotated trunk in order to engage abdominal (internal and external obliques) Emphasis on breathing techniques to draw in abdominals for support.  Pt then utilizing posterior chain and engaging Hip extension and knee flexion with water resistance while PT used Aqua stretch techniques to decrease muscle tension in low back on right gluteal and Right  low back     Supine abdominal hollowing with VC to keep hips up and feet up with barbells in UE submerged as much as possible in 5 minutes  Pt fatigued and rested.  Worked on prone aquatic barbell submerged for abdominal engagement Combo prone to supine with legs extended for 3 minutes to encourage active abdominal engagement.    Pt requires the buoyancy of water for active assisted exercises with buoyancy supported for strengthening and AROM exercises: PT  requires the viscosity of the water for resistance with strengthening exercises as well as balance Hydrostatic pressure also supports joints by unweighting joint load by at least 50 % in 3-4 feet depth water. 80% in chest to neck deep water. Water will allow for  reduced joint loading through buoyancy to help patient improve posture without excess stress and pain.                           PT Education - 10/12/21 2050     Education Details Pt reviewed question over Aquatic HEP and reinforced Ai Chi for home use before DC    Person(s) Educated Patient    Methods Explanation;Demonstration;Tactile cues;Verbal cues    Comprehension Verbalized understanding;Returned demonstration              PT Short Term Goals - 09/20/21 1533       PT SHORT TERM GOAL #1   Title Independent with initial HEP since hospitalization    Baseline able to do intiial supine exercise  moving on to advanced    Time 3    Period Weeks    Status Achieved      PT SHORT TERM GOAL #2   Title Pt will be able  to perform 6 minute walk test with 1809 feet    Baseline 6MWT  1120 feet with cane    Time 3    Period Weeks    Target Date 09/26/21      PT SHORT TERM GOAL #3   Title Pt will be able to perform sit to stand with proper form without using momentum and compensations    Baseline 5 x STS 68.9 sec with compensations  09-05-21 24.12.  5 x sts 12.06 sec    Time 3    Period Weeks    Status Achieved    Target Date 09/26/21      PT SHORT TERM GOAL #4   Title Pt will be offered opportunity for aquatic therapy to advance current program she is using on her own    Baseline scheduled for 2-3 additional aquatic sessions before joining the [employer]    Time 3    Period Weeks    Status Achieved    Target Date 09/26/21               PT Long Term Goals - 10/11/21 1524       PT LONG TERM GOAL #1   Title Pt will be independent with advanced HEP given    Baseline reviewed HEP and independent and going to gym for further strengthening    Time 6    Period Weeks    Status Achieved    Target Date 10/17/21      PT LONG TERM GOAL #2   Title Pt will be able to negotiate steps without exacerbation of pain greater than 2/10  and LRAD    Baseline Pt uses cane alternating with no increase in pain  today  2/10    Time 6    Period Weeks    Status Achieved    Target Date 10/17/21      PT LONG TERM GOAL #3   Title Pt will increase neck AROM 10 degreees bil to increase abilty to turn neck for ambulation and visual scanning    Baseline AROM R rot 39, L rot 35   R rot to 60, Lrotation to 55    Time 6    Period Weeks    Status Achieved    Target Date 09/06/21      PT LONG TERM GOAL #4   Title Pt will be able to perform standing to floor transfer in order to play with kids and to ensure safety in home    Baseline Pt demo using a chair to get down on the floor independently    Time 6    Period Weeks    Status Achieved    Target Date 10/17/21      PT LONG TERM GOAL #5   Title PT with be able to  walk/stand >/= 30 min with no AD with </= 3/10 pain for functional endurance and return to shopping/leisure activities    Baseline 6MWT 1307 ft with no LRAD  improved from 1120 ft. with cane    Time 6    Period Weeks    Status Achieved    Target Date 10/17/21      PT LONG TERM GOAL #6   Title Pt will incorporate regular land based exercise in order to maintain strength for ADL/household tasks    Baseline Pt planning to join [employer] Pt utilzing exercise  and can exercise at gym with son    Time 6    Period Weeks    Status Achieved    Target Date 10/17/21      PT LONG TERM GOAL #7   Title increase BERG balance  to >/= 50/56 to demo improvement in balance and safety    Baseline DGI 21/24    Time 6    Period Weeks    Status Achieved    Target Date 10/17/21                   Plan - 10/12/21 1421     Clinical Impression Statement Pt enter pool today with 3/10 pain generally and 3/10 in back/hip.  Pt reviewed HEP verbally and asked questions as necessary for reinforcement and demo as needed. Pt today concentrated time on Ai Chi for pain management and deep breathing/ relaxation to carry forward as she joins [employer] for continued use of pool     Pt requires the buoyancy of water for active assisted exercises with buoyancy supported for strengthening and AROM exercises: PT  requires the viscosity of the water for resistance with strengthening exercises as well as balance  Ms Tangeman completed land PT and reviewed HEP yesterday and completed all goals (she was able to complete 5X STS in 13.25 and 6MWT 1307 ft.  Ms Kohler will continue strengthening at local [employer]. Pt completed all LTG rehab goals and is ready for DC    Personal Factors and Comorbidities Comorbidity 1;Comorbidity 2    Comorbidities L TKA 01-26-21  DDD cardiac cath 2009  , Sciatica, RTC syndrome, plantar fasciitis, carpal tunnel.  OA, obesity,  See medical History, ALLERGIC to Percocet, Hydrocodone,Vicodin, Aleve     Examination-Participation Restrictions Meal Prep    PT Frequency 2x / week    PT Duration 6  weeks    PT Treatment/Interventions ADLs/Self Care Home Management;Aquatic Therapy;Cryotherapy;Moist Heat;Balance training;Therapeutic exercise;Therapeutic activities;Functional mobility training;Stair training;Gait training;Neuromuscular re-education;Patient/family education;Passive range of motion;Manual techniques;Dry needling;Taping;Joint Manipulations;Vestibular    PT Next Visit Plan DC    PT Home Exercise Plan [id number] - sit to stand, heel raise, chin tuck, scapular retraction, upper trap/ levator scapulae stretch, log roll [id number] for aquatics    Consulted and Agree with Plan of Care Patient             Patient will benefit from skilled therapeutic intervention in order to improve the following deficits and impairments:  Pain, Obesity, Postural dysfunction, Improper body mechanics, Difficulty walking, Decreased mobility, Decreased endurance, Decreased range of motion, Decreased strength, Hypomobility, Increased muscle spasms, Impaired flexibility, Impaired UE functional use, Decreased activity tolerance, Decreased balance  Visit Diagnosis: Cervicalgia  Unsteadiness on feet  Muscle weakness (generalized)  Cramp and spasm  Acute pain of left knee  Stiffness of left knee, not elsewhere classified  Repeated falls  Difficulty in walking, not elsewhere classified     Problem List Patient Active Problem List   Diagnosis Date Noted   Allergic reaction caused by a drug 07/26/2021   Primary osteoarthritis of left knee 12/29/2019   Morbid obesity ([place])    Bradycardia    Pulmonary embolism on long-term anticoagulation therapy ([place]) 04/06/2016   Pain in the chest    Chronic pain    Chest wall pain    Pulmonary embolism ([place]) 07/31/2013   Vocal cord dysfunction 02/28/2012   Dyspnea 01/17/2012   Chest pain 11/25/2011   Hypokalemia 11/25/2011   GERD (gastroesophageal reflux disease)  03/08/2011   DYSMENORRHEA 11/10/2010   PLANTAR FASCIITIS 09/14/2010   OSTEOARTHRITIS, KNEES, BILATERAL, SEVERE 06/22/2010   ROTATOR CUFF SYNDROME, RIGHT 04/11/2010   VITAMIN D DEFICIENCY 02/18/2010   Bipolar 1 disorder ([place]) 01/17/2010   Essential hypertension, benign 01/17/2010   INSOMNIA 01/17/2010   OBESITY, NOS 01/03/2007   Depression 01/03/2007   HEARING LOSS NOS OR DEAFNESS 01/03/2007   EDEMA-LEGS,DUE TO VENOUS OBSTRUCT. 01/03/2007   Irritable bowel syndrome 01/03/2007   Voncille Lo, PT, [place] Certified Exercise Expert for the Aging Adult  10/12/21 9:08 PM Phone: [phone] Fax: [facility] [address] [place], [place], [address] Phone: [phone]   Fax:  [phone]  Name: NAAVA JANEWAY MRN: [id number] Date of Birth: [birth year]  PHYSICAL THERAPY DISCHARGE SUMMARY  Visits from Start of Care: 12  Current functional level related to goals / functional outcomes: As above and on 10-11-21   Remaining deficits: Pt with 3/10 pain and radiculopathy that she is able to control with pain management and exercise.  She does have balance deficits on SLS < 10 sec, See flowsheet.  She has been able to improved standing and walking with minimal use of cane   Education / Equipment: HEP for land and for [employer] and plan to join [employer] to continue HEP   Patient agrees to discharge. Patient goals were met. Patient is being discharged due to meeting the stated rehab goals.   Pt is pleased with current level and can independently perform HEP  Voncille Lo, PT, [place] Certified Exercise Expert for the Aging Adult  10/12/21 9:08 PM Phone: [phone] Fax: [phone]

## 2021-10-13 DIAGNOSIS — Z96642 Presence of left artificial hip joint: Secondary | ICD-10-CM | POA: Diagnosis not present

## 2021-10-14 DIAGNOSIS — Z96642 Presence of left artificial hip joint: Secondary | ICD-10-CM | POA: Diagnosis not present

## 2021-10-15 DIAGNOSIS — Z96642 Presence of left artificial hip joint: Secondary | ICD-10-CM | POA: Diagnosis not present

## 2021-10-16 DIAGNOSIS — Z96642 Presence of left artificial hip joint: Secondary | ICD-10-CM | POA: Diagnosis not present

## 2021-10-17 DIAGNOSIS — Z96642 Presence of left artificial hip joint: Secondary | ICD-10-CM | POA: Diagnosis not present

## 2021-10-18 DIAGNOSIS — Z96642 Presence of left artificial hip joint: Secondary | ICD-10-CM | POA: Diagnosis not present

## 2021-10-19 DIAGNOSIS — Z96642 Presence of left artificial hip joint: Secondary | ICD-10-CM | POA: Diagnosis not present

## 2021-10-20 DIAGNOSIS — Z96642 Presence of left artificial hip joint: Secondary | ICD-10-CM | POA: Diagnosis not present

## 2021-10-21 DIAGNOSIS — Z96642 Presence of left artificial hip joint: Secondary | ICD-10-CM | POA: Diagnosis not present

## 2021-10-22 DIAGNOSIS — Z96642 Presence of left artificial hip joint: Secondary | ICD-10-CM | POA: Diagnosis not present

## 2021-10-23 DIAGNOSIS — Z96642 Presence of left artificial hip joint: Secondary | ICD-10-CM | POA: Diagnosis not present

## 2021-10-24 DIAGNOSIS — Z96642 Presence of left artificial hip joint: Secondary | ICD-10-CM | POA: Diagnosis not present

## 2021-10-25 DIAGNOSIS — Z96642 Presence of left artificial hip joint: Secondary | ICD-10-CM | POA: Diagnosis not present

## 2021-10-26 DIAGNOSIS — Z96642 Presence of left artificial hip joint: Secondary | ICD-10-CM | POA: Diagnosis not present

## 2021-10-27 DIAGNOSIS — Z96642 Presence of left artificial hip joint: Secondary | ICD-10-CM | POA: Diagnosis not present

## 2021-10-28 DIAGNOSIS — Z96642 Presence of left artificial hip joint: Secondary | ICD-10-CM | POA: Diagnosis not present

## 2021-10-29 DIAGNOSIS — Z96642 Presence of left artificial hip joint: Secondary | ICD-10-CM | POA: Diagnosis not present

## 2021-10-30 DIAGNOSIS — Z96642 Presence of left artificial hip joint: Secondary | ICD-10-CM | POA: Diagnosis not present

## 2021-10-31 DIAGNOSIS — Z96642 Presence of left artificial hip joint: Secondary | ICD-10-CM | POA: Diagnosis not present

## 2021-11-01 DIAGNOSIS — Z96642 Presence of left artificial hip joint: Secondary | ICD-10-CM | POA: Diagnosis not present

## 2021-11-02 DIAGNOSIS — Z96642 Presence of left artificial hip joint: Secondary | ICD-10-CM | POA: Diagnosis not present

## 2021-11-03 DIAGNOSIS — Z96642 Presence of left artificial hip joint: Secondary | ICD-10-CM | POA: Diagnosis not present

## 2021-11-04 DIAGNOSIS — Z96642 Presence of left artificial hip joint: Secondary | ICD-10-CM | POA: Diagnosis not present

## 2021-11-07 DIAGNOSIS — M1712 Unilateral primary osteoarthritis, left knee: Secondary | ICD-10-CM | POA: Diagnosis not present

## 2021-11-08 DIAGNOSIS — M1712 Unilateral primary osteoarthritis, left knee: Secondary | ICD-10-CM | POA: Diagnosis not present

## 2021-11-09 DIAGNOSIS — M1712 Unilateral primary osteoarthritis, left knee: Secondary | ICD-10-CM | POA: Diagnosis not present

## 2021-11-10 DIAGNOSIS — M1712 Unilateral primary osteoarthritis, left knee: Secondary | ICD-10-CM | POA: Diagnosis not present

## 2021-11-11 DIAGNOSIS — M1712 Unilateral primary osteoarthritis, left knee: Secondary | ICD-10-CM | POA: Diagnosis not present

## 2021-11-12 DIAGNOSIS — M1712 Unilateral primary osteoarthritis, left knee: Secondary | ICD-10-CM | POA: Diagnosis not present

## 2021-11-13 DIAGNOSIS — M1712 Unilateral primary osteoarthritis, left knee: Secondary | ICD-10-CM | POA: Diagnosis not present

## 2021-11-14 DIAGNOSIS — M1712 Unilateral primary osteoarthritis, left knee: Secondary | ICD-10-CM | POA: Diagnosis not present

## 2021-11-15 DIAGNOSIS — M1712 Unilateral primary osteoarthritis, left knee: Secondary | ICD-10-CM | POA: Diagnosis not present

## 2021-11-16 ENCOUNTER — Emergency Department (HOSPITAL_COMMUNITY)
Admission: EM | Admit: 2021-11-16 | Discharge: 2021-11-16 | Disposition: A | Discharge status: Home / Self Care | Payer: Medicaid Other | Attending: Emergency Medicine | Admitting: Emergency Medicine

## 2021-11-16 ENCOUNTER — Other Ambulatory Visit: Payer: Self-pay

## 2021-11-16 ENCOUNTER — Emergency Department (HOSPITAL_COMMUNITY): Payer: Medicaid Other

## 2021-11-16 DIAGNOSIS — R0602 Shortness of breath: Secondary | ICD-10-CM | POA: Diagnosis not present

## 2021-11-16 DIAGNOSIS — R110 Nausea: Secondary | ICD-10-CM | POA: Diagnosis not present

## 2021-11-16 DIAGNOSIS — Z7901 Long term (current) use of anticoagulants: Secondary | ICD-10-CM | POA: Insufficient documentation

## 2021-11-16 DIAGNOSIS — Z20822 Contact with and (suspected) exposure to covid-19: Secondary | ICD-10-CM | POA: Diagnosis not present

## 2021-11-16 DIAGNOSIS — M5412 Radiculopathy, cervical region: Secondary | ICD-10-CM | POA: Diagnosis not present

## 2021-11-16 DIAGNOSIS — Z7982 Long term (current) use of aspirin: Secondary | ICD-10-CM | POA: Diagnosis not present

## 2021-11-16 DIAGNOSIS — R079 Chest pain, unspecified: Secondary | ICD-10-CM | POA: Diagnosis not present

## 2021-11-16 DIAGNOSIS — R0789 Other chest pain: Secondary | ICD-10-CM | POA: Diagnosis not present

## 2021-11-16 DIAGNOSIS — R001 Bradycardia, unspecified: Secondary | ICD-10-CM | POA: Diagnosis not present

## 2021-11-16 DIAGNOSIS — M25511 Pain in right shoulder: Secondary | ICD-10-CM | POA: Diagnosis present

## 2021-11-16 DIAGNOSIS — M1712 Unilateral primary osteoarthritis, left knee: Secondary | ICD-10-CM | POA: Diagnosis not present

## 2021-11-16 LAB — CBC WITH DIFFERENTIAL/PLATELET
Abs Immature Granulocytes: 0.01 10*3/uL (ref 0.00–0.07)
Basophils Absolute: 0.1 10*3/uL (ref 0.0–0.1)
Basophils Relative: 1 %
Eosinophils Absolute: 0.1 10*3/uL (ref 0.0–0.5)
Eosinophils Relative: 1 %
HCT: 38.1 % (ref 36.0–46.0)
Hemoglobin: 12.5 g/dL (ref 12.0–15.0)
Immature Granulocytes: 0 %
Lymphocytes Relative: 47 %
Lymphs Abs: 4 10*3/uL (ref 0.7–4.0)
MCH: 32.1 pg (ref 26.0–34.0)
MCHC: 32.8 g/dL (ref 30.0–36.0)
MCV: 97.9 fL (ref 80.0–100.0)
Monocytes Absolute: 0.6 10*3/uL (ref 0.1–1.0)
Monocytes Relative: 7 %
Neutro Abs: 3.7 10*3/uL (ref 1.7–7.7)
Neutrophils Relative %: 44 %
Platelets: 287 10*3/uL (ref 150–400)
RBC: 3.89 MIL/uL (ref 3.87–5.11)
RDW: 13.2 % (ref 11.5–15.5)
WBC: 8.3 10*3/uL (ref 4.0–10.5)
nRBC: 0 % (ref 0.0–0.2)

## 2021-11-16 LAB — COMPREHENSIVE METABOLIC PANEL
ALT: 9 U/L (ref 0–44)
AST: 15 U/L (ref 15–41)
Albumin: 3.4 g/dL — ABNORMAL LOW (ref 3.5–5.0)
Alkaline Phosphatase: 77 U/L (ref 38–126)
Anion gap: 7 (ref 5–15)
BUN: 9 mg/dL (ref 6–20)
CO2: 24 mmol/L (ref 22–32)
Calcium: 8.6 mg/dL — ABNORMAL LOW (ref 8.9–10.3)
Chloride: 104 mmol/L (ref 98–111)
Creatinine, Ser: 0.59 mg/dL (ref 0.44–1.00)
GFR, Estimated: >60 mL/min (ref >60)
Glucose, Bld: 86 mg/dL (ref 70–99)
Potassium: 3.7 mmol/L (ref 3.5–5.1)
Sodium: 135 mmol/L (ref 135–145)
Total Bilirubin: 0.7 mg/dL (ref 0.3–1.2)
Total Protein: 6.8 g/dL (ref 6.5–8.1)

## 2021-11-16 LAB — TROPONIN I (HIGH SENSITIVITY)
Troponin I (High Sensitivity): 5 ng/L (ref <18)
Troponin I (High Sensitivity): 4 ng/L (ref <18)

## 2021-11-16 LAB — RESP PANEL BY RT-PCR (FLU A&B, COVID) ARPGX2
Influenza A by PCR: NEGATIVE
Influenza B by PCR: NEGATIVE
SARS Coronavirus 2 by RT PCR: NEGATIVE

## 2021-11-16 MED ORDER — OXYCODONE HCL 5 MG PO TABS
5.0000 mg | ORAL_TABLET | Freq: Once | ORAL | Status: AC
  Administered 2021-11-16: 5 mg via ORAL
  Filled 2021-11-16: qty 1

## 2021-11-16 MED ORDER — CYCLOBENZAPRINE HCL 10 MG PO TABS: 10.0000 mg | ORAL_TABLET | Freq: Two times a day (BID) | ORAL | 0 refills | Status: DC | PRN

## 2021-11-16 NOTE — ED Provider Notes (Signed)
[facility] EMERGENCY DEPARTMENT Provider Note   CSN: [id number] Arrival date & time: 11/16/21  0309     History  Chief Complaint  Patient presents with   Chest Pain    Katie [name] is a 56 y.o. female.  Patient presents with right neck pain and chest pain intermittent stabbing, worse with movement of her right shoulder and arm.  Mild radiation down the right arm.  Patient mild pressure upper anterior chest.  Patient had minimal shortness of breath.  Patient has a history of blood clot, has missed a few days on the weekend of her anticoagulant however usually is compliant and is not out of medications.  No fevers or chills, minimal cough.  Patient had mild nausea with the pain.  No diaphoresis, no new exertional symptoms.  No history of heart attack or stent.  Patient has stress test approximately 4 months ago she was told it was normal.  Patient normally has lower heart rate and has no symptoms of lightheadedness or syncope.  Patient has history of arthritis, no new injuries.  Patient is received injection for neck pains in the past.      Home Medications Prior to Admission medications   Medication Sig Start Date End Date Taking? Authorizing Provider  cyclobenzaprine (FLEXERIL) 10 MG tablet Take 1 tablet (10 mg total) by mouth 2 (two) times daily as needed for muscle spasms. 11/16/21  Yes Elnora Morrison, MD  diazepam (VALIUM) 10 MG tablet Take 10 mg by mouth 2 (two) times daily. 06/25/21  Yes [provider]  diphenhydrAMINE (BENADRYL) 25 MG tablet Take 1 tablet (25 mg total) by mouth every 6 (six) hours as needed for itching. 07/27/21  Yes Adhikari, Tamsen Meek, MD  furosemide (LASIX) 40 MG tablet Take 1 tablet (40 mg total) by mouth daily. 08/02/13  Yes Domenic Polite, MD  meclizine (ANTIVERT) 25 MG tablet Take 25 mg by mouth every 6 (six) hours as needed for dizziness. 09/23/21  Yes [provider]  omeprazole (PRILOSEC) 40 MG capsule Take 40 mg by mouth  daily.   Yes [provider]  phentermine (ADIPEX-P) 37.5 MG tablet Take 37.5 mg by mouth every morning.    Yes [provider]  potassium chloride SA (K-DUR,KLOR-CON) 20 MEQ tablet Take 20 mEq by mouth 2 (two) times daily. 05/03/16  Yes [provider]  Rivaroxaban (XARELTO) 20 MG TABS tablet Take 1 tablet (20 mg total) by mouth daily with supper. Starting 10/18, take 20mg  daily Patient taking differently: Take 20 mg by mouth daily with supper. 08/23/13  Yes Domenic Polite, MD  aspirin 81 MG chewable tablet Chew 81 mg by mouth daily.    [provider]  famotidine (PEPCID) 20 MG tablet Take 1 tablet (20 mg total) by mouth daily for 3 days. Patient not taking: Reported on 11/16/2021 07/27/21 07/30/21  Shelly Coss, MD  gabapentin (NEURONTIN) 300 MG capsule Take 2 capsules (600 mg total) by mouth 3 (three) times daily. Patient not taking: Reported on 11/16/2021 05/01/21 07/26/21  Carmin Muskrat, MD      Allergies    Contrast media [iodinated contrast media], Percocet [oxycodone-acetaminophen], Toradol [ketorolac tromethamine], Tylenol [acetaminophen], and Vicodin [hydrocodone-acetaminophen]    Review of Systems   Review of Systems  Constitutional:  Negative for chills and fever.  HENT:  Negative for congestion.   Eyes:  Negative for visual disturbance.  Respiratory:  Positive for shortness of breath.   Cardiovascular:  Positive for chest pain.  Gastrointestinal:  Positive  for nausea. Negative for abdominal pain and vomiting.  Genitourinary:  Negative for dysuria and flank pain.  Musculoskeletal:  Positive for neck pain. Negative for back pain and neck stiffness.  Skin:  Negative for rash.  Neurological:  Negative for weakness, light-headedness, numbness and headaches.   Physical Exam Updated Vital Signs BP 134/88    Pulse (!) 47    Temp 98.1 F (36.7 C)    Resp 17    LMP 12/28/2015 (Exact Date)    SpO2 100%  Physical Exam Vitals and nursing note  reviewed.  Constitutional:      General: She is not in acute distress.    Appearance: She is well-developed.  HENT:     Head: Normocephalic and atraumatic.     Mouth/Throat:     Mouth: Mucous membranes are moist.  Eyes:     General:        Right eye: No discharge.        Left eye: No discharge.     Conjunctiva/sclera: Conjunctivae normal.  Neck:     Trachea: No tracheal deviation.  Cardiovascular:     Rate and Rhythm: Normal rate and regular rhythm.     Heart sounds: No murmur heard. Pulmonary:     Effort: Pulmonary effort is normal.     Breath sounds: Normal breath sounds.  Abdominal:     General: There is no distension.     Palpations: Abdomen is soft.     Tenderness: There is no abdominal tenderness. There is no guarding.  Musculoskeletal:     Cervical back: Normal range of motion and neck supple. No rigidity.     Right lower leg: No edema.     Left lower leg: No edema.     Comments: Patient has tight musculature and tenderness right trapezius up into paracervical region on the right side.  Patient has decreased movement and strength with AB duction of the right shoulder due to pain.  Normal strength at the elbow and wrist flexion extension and fingers.  2+ pulses in the arms.  No midline spinal or upper thoracic tenderness.  Skin:    General: Skin is warm.     Capillary Refill: Capillary refill takes less than 2 seconds.     Findings: No rash.  Neurological:     General: No focal deficit present.     Mental Status: She is alert.     Cranial Nerves: No cranial nerve deficit.  Psychiatric:        Mood and Affect: Mood normal.  Is  ED Results / Procedures / Treatments   Labs (all labs ordered are listed, but only abnormal results are displayed) Labs Reviewed  COMPREHENSIVE METABOLIC PANEL - Abnormal; Notable for the following components:      Result Value   Calcium 8.6 (*)    Albumin 3.4 (*)    All other components within normal limits  RESP PANEL BY RT-PCR (FLU  A&B, COVID) ARPGX2  CBC WITH DIFFERENTIAL/PLATELET  TROPONIN I (HIGH SENSITIVITY)  TROPONIN I (HIGH SENSITIVITY)    EKG None  Radiology DG Chest 2 View  Result Date: 11/16/2021 CLINICAL DATA:  Chest pain EXAM: CHEST - 2 VIEW COMPARISON:  07/26/2021 FINDINGS: Normal heart size. Aortic tortuosity. No acute infiltrate or edema. No effusion or pneumothorax. No acute osseous findings. IMPRESSION: No active cardiopulmonary disease. Electronically Signed   By: Jorje Guild M.D.   On: 11/16/2021 04:25    Procedures Procedures    Medications Ordered in ED Medications  oxyCODONE (Oxy IR/ROXICODONE) immediate release tablet 5 mg (5 mg Oral Given 11/16/21 0857)    ED Course/ Medical Decision Making/ A&P                           Medical Decision Making  Patient presents with right chest pain and neck pain differential including ACS, heart attack, pulm embolism, musculoskeletal, radicular/disc herniation/arthritis, pneumonia, other.  Patient has bradycardia, sinus, normal for patient as she has no symptoms requiring admission or further work-up for this at this time however stressed to follow-up with primary doctor and if she develops lightheadedness or syncope she will need to hold/adjust medications. Blood work ordered and reviewed showing normal troponin and repeat negative troponin, EKG similar to previous nonspecific T wave findings, no signs of heart attack at this time.  Patient overall well-appearing, pain meds ordered, patient unable to have NSAIDs due to anticoagulant use.  No concern for significant pulmonary embolism at this time however we discussed might be a small PE but would not change management patient also has an allergy to contrast dye.  Patient myself both comfortable with no CT scan at this time.  Clinical concern for musculoskeletal/radicular given pain with movement and going down the right arm, no focal weakness to require emergent MRI.  Chest x-ray reviewed no acute  abnormalities.  Patient stable for outpatient follow-up pain meds given.        Final Clinical Impression(s) / ED Diagnoses Final diagnoses:  Sinus bradycardia  Right-sided chest pain  Cervical radiculopathy    Rx / DC Orders ED Discharge Orders          Ordered    cyclobenzaprine (FLEXERIL) 10 MG tablet  2 times daily PRN        11/16/21 0909              Elnora Morrison, MD 11/16/21 [phone]

## 2021-11-16 NOTE — ED Triage Notes (Addendum)
EMS report: pt from home c/o right sided chest pressure radiation neck arm and back onset yesterday. Rating 7/10. Pain improvement with laying down. EKG bradycardia HR 40s, pt states normal. BP 118/80. 324 ASA given by EMS

## 2021-11-16 NOTE — ED Notes (Signed)
Got patient into a gown on the monitor patient is resting with call bell in reach got patient warm blanket

## 2021-11-16 NOTE — ED Triage Notes (Signed)
Pt reports right sided CP onset yesterday at 9 pm while at rest. Pain described as sharp and stabbing, pain intermittent. Pain associated with dizziness, SOB and nausea. Followed by cardiology with stress test and cath that were both negative. No significant cardiac history.

## 2021-11-16 NOTE — Discharge Instructions (Signed)
Try muscle relaxant for tight neck muscles/ spasms, remember they can make you sleepy so no driving. Follow up with your local doctors for further evaluation. Return for worsening symptoms.

## 2021-11-16 NOTE — ED Provider Triage Note (Signed)
Emergency Medicine Provider Triage Evaluation Note  Katie [name] , a 56 y.o. female  was evaluated in triage.  Pt complains of chest pain.  Symptoms started just prior to arrival.  States that there sharp/dull/aching along the right side of the chest.  Her pain is intermittent and radiates of her right neck and down her right arm.  Reports associated shortness of breath as well as nausea.  No vomiting.  Reports a history of hypertension.  No history of smoking, diabetes mellitus, or hyperlipidemia.  Physical Exam  BP (!) 155/90 (BP Location: Left Arm)    Pulse (!) 44    Temp 97.6 F (36.4 C) (Oral)    Resp 18    LMP 12/28/2015 (Exact Date)    SpO2 96%  Gen:   Awake, no distress   Resp:  Normal effort  MSK:   Moves extremities without difficulty  Other:    Medical Decision Making  Medically screening exam initiated at 3:32 AM.  Appropriate orders placed.  [name] was informed that the remainder of the evaluation will be completed by another provider, this initial triage assessment does not replace that evaluation, and the importance of remaining in the ED until their evaluation is complete.   Placido Sou, PA-C 11/16/21 [phone]

## 2021-11-17 DIAGNOSIS — M1712 Unilateral primary osteoarthritis, left knee: Secondary | ICD-10-CM | POA: Diagnosis not present

## 2021-11-18 DIAGNOSIS — M1712 Unilateral primary osteoarthritis, left knee: Secondary | ICD-10-CM | POA: Diagnosis not present

## 2021-11-19 DIAGNOSIS — M1712 Unilateral primary osteoarthritis, left knee: Secondary | ICD-10-CM | POA: Diagnosis not present

## 2021-11-20 DIAGNOSIS — M1712 Unilateral primary osteoarthritis, left knee: Secondary | ICD-10-CM | POA: Diagnosis not present

## 2021-11-21 DIAGNOSIS — M1712 Unilateral primary osteoarthritis, left knee: Secondary | ICD-10-CM | POA: Diagnosis not present

## 2021-11-22 DIAGNOSIS — M1712 Unilateral primary osteoarthritis, left knee: Secondary | ICD-10-CM | POA: Diagnosis not present

## 2021-11-23 DIAGNOSIS — M1712 Unilateral primary osteoarthritis, left knee: Secondary | ICD-10-CM | POA: Diagnosis not present

## 2021-11-24 DIAGNOSIS — M1712 Unilateral primary osteoarthritis, left knee: Secondary | ICD-10-CM | POA: Diagnosis not present

## 2021-11-25 DIAGNOSIS — M1712 Unilateral primary osteoarthritis, left knee: Secondary | ICD-10-CM | POA: Diagnosis not present

## 2021-11-26 DIAGNOSIS — M1712 Unilateral primary osteoarthritis, left knee: Secondary | ICD-10-CM | POA: Diagnosis not present

## 2021-11-27 DIAGNOSIS — M1712 Unilateral primary osteoarthritis, left knee: Secondary | ICD-10-CM | POA: Diagnosis not present

## 2021-11-28 DIAGNOSIS — M1712 Unilateral primary osteoarthritis, left knee: Secondary | ICD-10-CM | POA: Diagnosis not present

## 2021-11-29 DIAGNOSIS — M1712 Unilateral primary osteoarthritis, left knee: Secondary | ICD-10-CM | POA: Diagnosis not present

## 2021-11-30 DIAGNOSIS — M1712 Unilateral primary osteoarthritis, left knee: Secondary | ICD-10-CM | POA: Diagnosis not present

## 2021-12-01 DIAGNOSIS — M1712 Unilateral primary osteoarthritis, left knee: Secondary | ICD-10-CM | POA: Diagnosis not present

## 2021-12-02 DIAGNOSIS — M1712 Unilateral primary osteoarthritis, left knee: Secondary | ICD-10-CM | POA: Diagnosis not present

## 2021-12-03 DIAGNOSIS — M1712 Unilateral primary osteoarthritis, left knee: Secondary | ICD-10-CM | POA: Diagnosis not present

## 2021-12-04 DIAGNOSIS — M1712 Unilateral primary osteoarthritis, left knee: Secondary | ICD-10-CM | POA: Diagnosis not present

## 2021-12-05 DIAGNOSIS — M1712 Unilateral primary osteoarthritis, left knee: Secondary | ICD-10-CM | POA: Diagnosis not present

## 2021-12-06 DIAGNOSIS — M1712 Unilateral primary osteoarthritis, left knee: Secondary | ICD-10-CM | POA: Diagnosis not present

## 2021-12-07 DIAGNOSIS — M1712 Unilateral primary osteoarthritis, left knee: Secondary | ICD-10-CM | POA: Diagnosis not present

## 2021-12-08 DIAGNOSIS — M1712 Unilateral primary osteoarthritis, left knee: Secondary | ICD-10-CM | POA: Diagnosis not present

## 2021-12-09 DIAGNOSIS — M1712 Unilateral primary osteoarthritis, left knee: Secondary | ICD-10-CM | POA: Diagnosis not present

## 2021-12-10 DIAGNOSIS — M1712 Unilateral primary osteoarthritis, left knee: Secondary | ICD-10-CM | POA: Diagnosis not present

## 2021-12-11 DIAGNOSIS — M1712 Unilateral primary osteoarthritis, left knee: Secondary | ICD-10-CM | POA: Diagnosis not present

## 2021-12-12 DIAGNOSIS — M1712 Unilateral primary osteoarthritis, left knee: Secondary | ICD-10-CM | POA: Diagnosis not present

## 2021-12-13 DIAGNOSIS — M1712 Unilateral primary osteoarthritis, left knee: Secondary | ICD-10-CM | POA: Diagnosis not present

## 2021-12-14 DIAGNOSIS — M1712 Unilateral primary osteoarthritis, left knee: Secondary | ICD-10-CM | POA: Diagnosis not present

## 2021-12-15 DIAGNOSIS — M1712 Unilateral primary osteoarthritis, left knee: Secondary | ICD-10-CM | POA: Diagnosis not present

## 2021-12-16 DIAGNOSIS — M1712 Unilateral primary osteoarthritis, left knee: Secondary | ICD-10-CM | POA: Diagnosis not present

## 2021-12-17 DIAGNOSIS — M1712 Unilateral primary osteoarthritis, left knee: Secondary | ICD-10-CM | POA: Diagnosis not present

## 2021-12-18 DIAGNOSIS — M1712 Unilateral primary osteoarthritis, left knee: Secondary | ICD-10-CM | POA: Diagnosis not present

## 2021-12-19 DIAGNOSIS — M1712 Unilateral primary osteoarthritis, left knee: Secondary | ICD-10-CM | POA: Diagnosis not present

## 2021-12-20 DIAGNOSIS — M1712 Unilateral primary osteoarthritis, left knee: Secondary | ICD-10-CM | POA: Diagnosis not present

## 2021-12-21 DIAGNOSIS — M1712 Unilateral primary osteoarthritis, left knee: Secondary | ICD-10-CM | POA: Diagnosis not present

## 2021-12-22 DIAGNOSIS — M1712 Unilateral primary osteoarthritis, left knee: Secondary | ICD-10-CM | POA: Diagnosis not present

## 2021-12-23 DIAGNOSIS — M1712 Unilateral primary osteoarthritis, left knee: Secondary | ICD-10-CM | POA: Diagnosis not present

## 2021-12-24 DIAGNOSIS — M1712 Unilateral primary osteoarthritis, left knee: Secondary | ICD-10-CM | POA: Diagnosis not present

## 2021-12-25 DIAGNOSIS — M1712 Unilateral primary osteoarthritis, left knee: Secondary | ICD-10-CM | POA: Diagnosis not present

## 2021-12-26 DIAGNOSIS — M1712 Unilateral primary osteoarthritis, left knee: Secondary | ICD-10-CM | POA: Diagnosis not present

## 2021-12-27 DIAGNOSIS — M1712 Unilateral primary osteoarthritis, left knee: Secondary | ICD-10-CM | POA: Diagnosis not present

## 2021-12-28 DIAGNOSIS — M1712 Unilateral primary osteoarthritis, left knee: Secondary | ICD-10-CM | POA: Diagnosis not present

## 2021-12-29 DIAGNOSIS — R200 Anesthesia of skin: Secondary | ICD-10-CM | POA: Diagnosis not present

## 2021-12-29 DIAGNOSIS — R202 Paresthesia of skin: Secondary | ICD-10-CM | POA: Diagnosis not present

## 2021-12-29 DIAGNOSIS — M1712 Unilateral primary osteoarthritis, left knee: Secondary | ICD-10-CM | POA: Diagnosis not present

## 2021-12-29 DIAGNOSIS — M79601 Pain in right arm: Secondary | ICD-10-CM | POA: Diagnosis not present

## 2021-12-29 DIAGNOSIS — M79602 Pain in left arm: Secondary | ICD-10-CM | POA: Diagnosis not present

## 2021-12-30 DIAGNOSIS — M1712 Unilateral primary osteoarthritis, left knee: Secondary | ICD-10-CM | POA: Diagnosis not present

## 2021-12-31 DIAGNOSIS — M1712 Unilateral primary osteoarthritis, left knee: Secondary | ICD-10-CM | POA: Diagnosis not present

## 2022-01-01 DIAGNOSIS — M1712 Unilateral primary osteoarthritis, left knee: Secondary | ICD-10-CM | POA: Diagnosis not present

## 2022-01-02 DIAGNOSIS — M1712 Unilateral primary osteoarthritis, left knee: Secondary | ICD-10-CM | POA: Diagnosis not present

## 2022-01-02 DIAGNOSIS — R200 Anesthesia of skin: Secondary | ICD-10-CM | POA: Diagnosis not present

## 2022-01-02 DIAGNOSIS — Z6841 Body Mass Index (BMI) 40.0 and over, adult: Secondary | ICD-10-CM | POA: Diagnosis not present

## 2022-01-02 DIAGNOSIS — R202 Paresthesia of skin: Secondary | ICD-10-CM | POA: Diagnosis not present

## 2022-01-03 DIAGNOSIS — M1712 Unilateral primary osteoarthritis, left knee: Secondary | ICD-10-CM | POA: Diagnosis not present

## 2022-01-04 DIAGNOSIS — M1712 Unilateral primary osteoarthritis, left knee: Secondary | ICD-10-CM | POA: Diagnosis not present

## 2022-01-05 DIAGNOSIS — M1712 Unilateral primary osteoarthritis, left knee: Secondary | ICD-10-CM | POA: Diagnosis not present

## 2022-01-06 DIAGNOSIS — M1712 Unilateral primary osteoarthritis, left knee: Secondary | ICD-10-CM | POA: Diagnosis not present

## 2022-01-07 DIAGNOSIS — M1712 Unilateral primary osteoarthritis, left knee: Secondary | ICD-10-CM | POA: Diagnosis not present

## 2022-01-08 DIAGNOSIS — M1712 Unilateral primary osteoarthritis, left knee: Secondary | ICD-10-CM | POA: Diagnosis not present

## 2022-01-09 ENCOUNTER — Other Ambulatory Visit: Payer: Self-pay | Admitting: Neurosurgery

## 2022-01-09 DIAGNOSIS — R200 Anesthesia of skin: Secondary | ICD-10-CM

## 2022-01-09 DIAGNOSIS — M1712 Unilateral primary osteoarthritis, left knee: Secondary | ICD-10-CM | POA: Diagnosis not present

## 2022-01-09 DIAGNOSIS — R202 Paresthesia of skin: Secondary | ICD-10-CM

## 2022-01-15 DIAGNOSIS — M1712 Unilateral primary osteoarthritis, left knee: Secondary | ICD-10-CM | POA: Diagnosis not present

## 2022-01-16 DIAGNOSIS — M1712 Unilateral primary osteoarthritis, left knee: Secondary | ICD-10-CM | POA: Diagnosis not present

## 2022-01-17 DIAGNOSIS — M1712 Unilateral primary osteoarthritis, left knee: Secondary | ICD-10-CM | POA: Diagnosis not present

## 2022-01-18 DIAGNOSIS — M1712 Unilateral primary osteoarthritis, left knee: Secondary | ICD-10-CM | POA: Diagnosis not present

## 2022-01-19 ENCOUNTER — Ambulatory Visit
Admission: RE | Admit: 2022-01-19 | Discharge: 2022-01-19 | Disposition: A | Discharge status: Home / Self Care | Payer: Medicaid Other | Source: Ambulatory Visit | Attending: Neurosurgery | Admitting: Neurosurgery

## 2022-01-19 ENCOUNTER — Other Ambulatory Visit: Payer: Self-pay

## 2022-01-19 DIAGNOSIS — M2578 Osteophyte, vertebrae: Secondary | ICD-10-CM | POA: Diagnosis not present

## 2022-01-19 DIAGNOSIS — M1712 Unilateral primary osteoarthritis, left knee: Secondary | ICD-10-CM | POA: Diagnosis not present

## 2022-01-19 DIAGNOSIS — R200 Anesthesia of skin: Secondary | ICD-10-CM | POA: Diagnosis not present

## 2022-01-19 DIAGNOSIS — M542 Cervicalgia: Secondary | ICD-10-CM | POA: Diagnosis not present

## 2022-01-19 DIAGNOSIS — R202 Paresthesia of skin: Secondary | ICD-10-CM | POA: Diagnosis not present

## 2022-01-20 DIAGNOSIS — M1712 Unilateral primary osteoarthritis, left knee: Secondary | ICD-10-CM | POA: Diagnosis not present

## 2022-01-21 DIAGNOSIS — M1712 Unilateral primary osteoarthritis, left knee: Secondary | ICD-10-CM | POA: Diagnosis not present

## 2022-01-22 DIAGNOSIS — M1712 Unilateral primary osteoarthritis, left knee: Secondary | ICD-10-CM | POA: Diagnosis not present

## 2022-01-23 DIAGNOSIS — R202 Paresthesia of skin: Secondary | ICD-10-CM | POA: Diagnosis not present

## 2022-01-23 DIAGNOSIS — Z6841 Body Mass Index (BMI) 40.0 and over, adult: Secondary | ICD-10-CM | POA: Diagnosis not present

## 2022-01-23 DIAGNOSIS — I10 Essential (primary) hypertension: Secondary | ICD-10-CM | POA: Diagnosis not present

## 2022-01-23 DIAGNOSIS — R200 Anesthesia of skin: Secondary | ICD-10-CM | POA: Diagnosis not present

## 2022-01-29 DIAGNOSIS — M1712 Unilateral primary osteoarthritis, left knee: Secondary | ICD-10-CM | POA: Diagnosis not present

## 2022-01-30 DIAGNOSIS — M1712 Unilateral primary osteoarthritis, left knee: Secondary | ICD-10-CM | POA: Diagnosis not present

## 2022-01-31 DIAGNOSIS — M1712 Unilateral primary osteoarthritis, left knee: Secondary | ICD-10-CM | POA: Diagnosis not present

## 2022-02-01 DIAGNOSIS — M1712 Unilateral primary osteoarthritis, left knee: Secondary | ICD-10-CM | POA: Diagnosis not present

## 2022-02-02 DIAGNOSIS — M1712 Unilateral primary osteoarthritis, left knee: Secondary | ICD-10-CM | POA: Diagnosis not present

## 2022-02-03 DIAGNOSIS — M1712 Unilateral primary osteoarthritis, left knee: Secondary | ICD-10-CM | POA: Diagnosis not present

## 2022-02-05 DIAGNOSIS — M1712 Unilateral primary osteoarthritis, left knee: Secondary | ICD-10-CM | POA: Diagnosis not present

## 2022-02-06 DIAGNOSIS — M1712 Unilateral primary osteoarthritis, left knee: Secondary | ICD-10-CM | POA: Diagnosis not present

## 2022-02-07 DIAGNOSIS — M1712 Unilateral primary osteoarthritis, left knee: Secondary | ICD-10-CM | POA: Diagnosis not present

## 2022-02-08 DIAGNOSIS — M1712 Unilateral primary osteoarthritis, left knee: Secondary | ICD-10-CM | POA: Diagnosis not present

## 2022-02-09 DIAGNOSIS — M1712 Unilateral primary osteoarthritis, left knee: Secondary | ICD-10-CM | POA: Diagnosis not present

## 2022-02-10 DIAGNOSIS — M1712 Unilateral primary osteoarthritis, left knee: Secondary | ICD-10-CM | POA: Diagnosis not present

## 2022-02-11 DIAGNOSIS — M1712 Unilateral primary osteoarthritis, left knee: Secondary | ICD-10-CM | POA: Diagnosis not present

## 2022-02-13 DIAGNOSIS — M1712 Unilateral primary osteoarthritis, left knee: Secondary | ICD-10-CM | POA: Diagnosis not present

## 2022-02-14 DIAGNOSIS — M1712 Unilateral primary osteoarthritis, left knee: Secondary | ICD-10-CM | POA: Diagnosis not present

## 2022-02-15 DIAGNOSIS — M1712 Unilateral primary osteoarthritis, left knee: Secondary | ICD-10-CM | POA: Diagnosis not present

## 2022-02-16 DIAGNOSIS — M1712 Unilateral primary osteoarthritis, left knee: Secondary | ICD-10-CM | POA: Diagnosis not present

## 2022-02-17 DIAGNOSIS — M1712 Unilateral primary osteoarthritis, left knee: Secondary | ICD-10-CM | POA: Diagnosis not present

## 2022-02-17 DIAGNOSIS — M542 Cervicalgia: Secondary | ICD-10-CM | POA: Diagnosis not present

## 2022-02-17 DIAGNOSIS — M722 Plantar fascial fibromatosis: Secondary | ICD-10-CM | POA: Diagnosis not present

## 2022-02-17 DIAGNOSIS — M545 Low back pain, unspecified: Secondary | ICD-10-CM | POA: Diagnosis not present

## 2022-02-20 DIAGNOSIS — M1712 Unilateral primary osteoarthritis, left knee: Secondary | ICD-10-CM | POA: Diagnosis not present

## 2022-02-21 DIAGNOSIS — M1712 Unilateral primary osteoarthritis, left knee: Secondary | ICD-10-CM | POA: Diagnosis not present

## 2022-02-22 DIAGNOSIS — M1712 Unilateral primary osteoarthritis, left knee: Secondary | ICD-10-CM | POA: Diagnosis not present

## 2022-02-23 DIAGNOSIS — M1712 Unilateral primary osteoarthritis, left knee: Secondary | ICD-10-CM | POA: Diagnosis not present

## 2022-02-24 DIAGNOSIS — M1712 Unilateral primary osteoarthritis, left knee: Secondary | ICD-10-CM | POA: Diagnosis not present

## 2022-02-25 DIAGNOSIS — M1712 Unilateral primary osteoarthritis, left knee: Secondary | ICD-10-CM | POA: Diagnosis not present

## 2022-02-26 DIAGNOSIS — M1712 Unilateral primary osteoarthritis, left knee: Secondary | ICD-10-CM | POA: Diagnosis not present

## 2022-02-27 DIAGNOSIS — M1712 Unilateral primary osteoarthritis, left knee: Secondary | ICD-10-CM | POA: Diagnosis not present

## 2022-02-28 DIAGNOSIS — M1712 Unilateral primary osteoarthritis, left knee: Secondary | ICD-10-CM | POA: Diagnosis not present

## 2022-03-01 DIAGNOSIS — M1712 Unilateral primary osteoarthritis, left knee: Secondary | ICD-10-CM | POA: Diagnosis not present

## 2022-03-02 DIAGNOSIS — M1712 Unilateral primary osteoarthritis, left knee: Secondary | ICD-10-CM | POA: Diagnosis not present

## 2022-03-03 DIAGNOSIS — M5416 Radiculopathy, lumbar region: Secondary | ICD-10-CM | POA: Diagnosis not present

## 2022-03-03 DIAGNOSIS — M1712 Unilateral primary osteoarthritis, left knee: Secondary | ICD-10-CM | POA: Diagnosis not present

## 2022-03-04 DIAGNOSIS — M1712 Unilateral primary osteoarthritis, left knee: Secondary | ICD-10-CM | POA: Diagnosis not present

## 2022-03-05 DIAGNOSIS — M1712 Unilateral primary osteoarthritis, left knee: Secondary | ICD-10-CM | POA: Diagnosis not present

## 2022-03-06 DIAGNOSIS — M1712 Unilateral primary osteoarthritis, left knee: Secondary | ICD-10-CM | POA: Diagnosis not present

## 2022-03-07 DIAGNOSIS — M1712 Unilateral primary osteoarthritis, left knee: Secondary | ICD-10-CM | POA: Diagnosis not present

## 2022-03-08 DIAGNOSIS — M1712 Unilateral primary osteoarthritis, left knee: Secondary | ICD-10-CM | POA: Diagnosis not present

## 2022-03-09 DIAGNOSIS — M1712 Unilateral primary osteoarthritis, left knee: Secondary | ICD-10-CM | POA: Diagnosis not present

## 2022-03-10 DIAGNOSIS — M1712 Unilateral primary osteoarthritis, left knee: Secondary | ICD-10-CM | POA: Diagnosis not present

## 2022-03-11 DIAGNOSIS — M1712 Unilateral primary osteoarthritis, left knee: Secondary | ICD-10-CM | POA: Diagnosis not present

## 2022-03-12 DIAGNOSIS — M1712 Unilateral primary osteoarthritis, left knee: Secondary | ICD-10-CM | POA: Diagnosis not present

## 2022-03-13 DIAGNOSIS — M1712 Unilateral primary osteoarthritis, left knee: Secondary | ICD-10-CM | POA: Diagnosis not present

## 2022-03-14 DIAGNOSIS — M1712 Unilateral primary osteoarthritis, left knee: Secondary | ICD-10-CM | POA: Diagnosis not present

## 2022-03-15 DIAGNOSIS — M1712 Unilateral primary osteoarthritis, left knee: Secondary | ICD-10-CM | POA: Diagnosis not present

## 2022-03-16 DIAGNOSIS — M1712 Unilateral primary osteoarthritis, left knee: Secondary | ICD-10-CM | POA: Diagnosis not present

## 2022-03-17 DIAGNOSIS — M1712 Unilateral primary osteoarthritis, left knee: Secondary | ICD-10-CM | POA: Diagnosis not present

## 2022-03-18 DIAGNOSIS — M1712 Unilateral primary osteoarthritis, left knee: Secondary | ICD-10-CM | POA: Diagnosis not present

## 2022-03-19 DIAGNOSIS — M1712 Unilateral primary osteoarthritis, left knee: Secondary | ICD-10-CM | POA: Diagnosis not present

## 2022-03-26 DIAGNOSIS — M1712 Unilateral primary osteoarthritis, left knee: Secondary | ICD-10-CM | POA: Diagnosis not present

## 2022-03-27 DIAGNOSIS — M1712 Unilateral primary osteoarthritis, left knee: Secondary | ICD-10-CM | POA: Diagnosis not present

## 2022-03-28 DIAGNOSIS — M1712 Unilateral primary osteoarthritis, left knee: Secondary | ICD-10-CM | POA: Diagnosis not present

## 2022-03-28 DIAGNOSIS — M5416 Radiculopathy, lumbar region: Secondary | ICD-10-CM | POA: Diagnosis not present

## 2022-03-29 DIAGNOSIS — M1712 Unilateral primary osteoarthritis, left knee: Secondary | ICD-10-CM | POA: Diagnosis not present

## 2022-03-30 DIAGNOSIS — M1712 Unilateral primary osteoarthritis, left knee: Secondary | ICD-10-CM | POA: Diagnosis not present

## 2022-03-31 DIAGNOSIS — M1712 Unilateral primary osteoarthritis, left knee: Secondary | ICD-10-CM | POA: Diagnosis not present

## 2022-04-01 DIAGNOSIS — M1712 Unilateral primary osteoarthritis, left knee: Secondary | ICD-10-CM | POA: Diagnosis not present

## 2022-04-02 DIAGNOSIS — M1712 Unilateral primary osteoarthritis, left knee: Secondary | ICD-10-CM | POA: Diagnosis not present

## 2022-04-03 DIAGNOSIS — M1712 Unilateral primary osteoarthritis, left knee: Secondary | ICD-10-CM | POA: Diagnosis not present

## 2022-04-04 DIAGNOSIS — M1712 Unilateral primary osteoarthritis, left knee: Secondary | ICD-10-CM | POA: Diagnosis not present

## 2022-04-05 DIAGNOSIS — M1712 Unilateral primary osteoarthritis, left knee: Secondary | ICD-10-CM | POA: Diagnosis not present

## 2022-04-06 DIAGNOSIS — M1712 Unilateral primary osteoarthritis, left knee: Secondary | ICD-10-CM | POA: Diagnosis not present

## 2022-04-07 DIAGNOSIS — M1712 Unilateral primary osteoarthritis, left knee: Secondary | ICD-10-CM | POA: Diagnosis not present

## 2022-04-08 DIAGNOSIS — M1712 Unilateral primary osteoarthritis, left knee: Secondary | ICD-10-CM | POA: Diagnosis not present

## 2022-04-09 DIAGNOSIS — M1712 Unilateral primary osteoarthritis, left knee: Secondary | ICD-10-CM | POA: Diagnosis not present

## 2022-04-10 DIAGNOSIS — M1712 Unilateral primary osteoarthritis, left knee: Secondary | ICD-10-CM | POA: Diagnosis not present

## 2022-04-11 DIAGNOSIS — M1712 Unilateral primary osteoarthritis, left knee: Secondary | ICD-10-CM | POA: Diagnosis not present

## 2022-04-12 DIAGNOSIS — M1712 Unilateral primary osteoarthritis, left knee: Secondary | ICD-10-CM | POA: Diagnosis not present

## 2022-04-13 DIAGNOSIS — M1712 Unilateral primary osteoarthritis, left knee: Secondary | ICD-10-CM | POA: Diagnosis not present

## 2022-04-14 DIAGNOSIS — M1712 Unilateral primary osteoarthritis, left knee: Secondary | ICD-10-CM | POA: Diagnosis not present

## 2022-04-15 DIAGNOSIS — M1712 Unilateral primary osteoarthritis, left knee: Secondary | ICD-10-CM | POA: Diagnosis not present

## 2022-04-16 DIAGNOSIS — M1712 Unilateral primary osteoarthritis, left knee: Secondary | ICD-10-CM | POA: Diagnosis not present

## 2022-04-17 DIAGNOSIS — M1712 Unilateral primary osteoarthritis, left knee: Secondary | ICD-10-CM | POA: Diagnosis not present

## 2022-04-18 DIAGNOSIS — M1712 Unilateral primary osteoarthritis, left knee: Secondary | ICD-10-CM | POA: Diagnosis not present

## 2022-04-19 DIAGNOSIS — M1712 Unilateral primary osteoarthritis, left knee: Secondary | ICD-10-CM | POA: Diagnosis not present

## 2022-04-20 DIAGNOSIS — M1712 Unilateral primary osteoarthritis, left knee: Secondary | ICD-10-CM | POA: Diagnosis not present

## 2022-04-21 DIAGNOSIS — M1712 Unilateral primary osteoarthritis, left knee: Secondary | ICD-10-CM | POA: Diagnosis not present

## 2022-04-22 DIAGNOSIS — M1712 Unilateral primary osteoarthritis, left knee: Secondary | ICD-10-CM | POA: Diagnosis not present

## 2022-04-23 DIAGNOSIS — M1712 Unilateral primary osteoarthritis, left knee: Secondary | ICD-10-CM | POA: Diagnosis not present

## 2022-04-24 DIAGNOSIS — M1712 Unilateral primary osteoarthritis, left knee: Secondary | ICD-10-CM | POA: Diagnosis not present

## 2022-04-24 NOTE — Therapy (Signed)
OUTPATIENT PHYSICAL THERAPY THORACOLUMBAR EVALUATION   Patient Name: Katie [name] MRN: [id number] DOB:04/06/1966, 56 y.o., female Today's Date: 04/25/2022   PT End of Session - 04/25/22 1139     Visit Number 1    Number of Visits 16    Date for PT Re-Evaluation 06/20/22    Authorization Type [employer] NO tx, estim unattended , [facility]    PT Start Time [phone]    PT Stop Time 1018    PT Time Calculation (min) 44 min             Past Medical History:  Diagnosis Date   Anxiety    Back pain    Bipolar 1 disorder (HCC)    Chronic pain entered 08/15/2015   Treated with Oxycodone   Depression    Depression    Dyspnea    History of acute bronchitis    HTN (hypertension)    OA (osteoarthritis)    Obesity    PE (pulmonary embolism)    oct 2014   Sciatica    Past Surgical History:  Procedure Laterality Date   ADENOIDECTOMY     BIOPSY STOMACH     CARDIAC CATHETERIZATION  2009   [place]   CESAREAN SECTION  2002   x 2, 1997 and 2002   CHOLECYSTECTOMY  2007   KNEE SURGERY Right 2005   TONSILLECTOMY     TOTAL KNEE ARTHROPLASTY Left 01/27/2020   Procedure: TOTAL KNEE ARTHROPLASTY;  Surgeon: Sheral ApleyMurphy, Timothy D, MD;  Location: [facility];  Service: Orthopedics;  Laterality: Left;   TYMPANOSTOMY TUBE PLACEMENT     Patient Active Problem List   Diagnosis Date Noted   Allergic reaction caused by a drug 07/26/2021   Primary osteoarthritis of left knee 12/29/2019   Morbid obesity (HCC)    Bradycardia    Pulmonary embolism on long-term anticoagulation therapy (HCC) 04/06/2016   Pain in the chest    Chronic pain    Chest wall pain    Pulmonary embolism (HCC) 07/31/2013   Vocal cord dysfunction 02/28/2012   Dyspnea 01/17/2012   Chest pain 11/25/2011   Hypokalemia 11/25/2011   GERD (gastroesophageal reflux disease) 03/08/2011   DYSMENORRHEA 11/10/2010   PLANTAR FASCIITIS 09/14/2010   OSTEOARTHRITIS, KNEES, BILATERAL, SEVERE 06/22/2010   ROTATOR CUFF  SYNDROME, RIGHT 04/11/2010   VITAMIN D DEFICIENCY 02/18/2010   Bipolar 1 disorder (HCC) 01/17/2010   Essential hypertension, benign 01/17/2010   INSOMNIA 01/17/2010   OBESITY, NOS 01/03/2007   Depression 01/03/2007   HEARING LOSS NOS OR DEAFNESS 01/03/2007   EDEMA-LEGS,DUE TO VENOUS OBSTRUCT. 01/03/2007   Irritable bowel syndrome 01/03/2007    PCP:  Rometta EmeryGarba, Mohammad L, MD  REFERRING PROVIDER: Pati GalloKramer, James, MD  REFERRING DIAG: Lumbar radicular pain & spondylosis  Rationale for Evaluation and Treatment Rehabilitation  THERAPY DIAG:  Chronic right-sided low back pain with sciatica, sciatica laterality unspecified  Unsteadiness on feet  Muscle weakness (generalized)  Repeated falls  Cervicalgia  ONSET DATE: March 2023 getting worse  SUBJECTIVE:  SUBJECTIVE STATEMENT: I am a 7/10 in my 80% on R side and about 20% on my left.  I have pain down my Right leg down to R calf.   I also have pain down my R arm to all hand and down to my R mid calf.  I have been losing balance more lately and I sue my cane at all times. I had a pain and Dr Maurice Small did injection around [facility] 2023. Soon after I left PT I had paralysis in my Right side and I got injection did help. I recently gave me one before my last injection for a total of 2 .  I do get relief but only for 3-4 days.  I have had 3 falls and I feel like I lose control of my right side. Even yesterday turning around I lose balance.  I have a home health aide that does cleaning and doing dishes. She helps with the food because I cannot do the chopping. I have pain in my chest due to muscle strain. And Had it checked out. Heel spur starting March and I stopped exercising.  Pt also reports going to ED for Chest pain and result was cervical radiculopathy/  musculoskeletal chest tightness.  X rays mild DDD.  Pt is fearful of falling and cannot get up from the ground or transfer standing to floor at this time PERTINENT HISTORY:  L TKA 01-26-21, DDD, cardiac cath 2009, Sciatica, RTC syndrome, plantar fasciitis, carpal tunnel, OA, obesisty,  Se medical hx  PAIN:  Are you having pain? Yes: NPRS scale: 7/10 Pain location: R low back 80% and Left 20%  also numbness in my R hand and I lose grip, Pt also pain in chest recently checked as muculoskeletal /cervical radiculpathy  Pain description: stabbing sensation, sometime sharp, aching Aggravating factors: sitting for longer than 5-10 min, standing 5-10 minutes, walking 10 min.  I have to shop holding onto grocery cart and ride in chair. Pick 2 items and leave. Relieving factors: try to rest  I cannot grip things and I do drive getting in and out of the car.  Sleep and wake up every 2 weeks to turn  PRECAUTIONS: Other: ALERGIC to percocet, hydrocodone, Vicodin, Aleve  WEIGHT BEARING RESTRICTIONS No  FALLS:  Has patient fallen in last 6 months? Yes. Number of falls 3   I have fallen and I try to walk and my whole body gets weak and I lose control of my R side I fell in the kitchen when I was trying to turn and I get dizzy. LIVING ENVIRONMENT: Lives with: lives with their spouse and lives with their son Lives in: House/apartment Stairs: Yes: External: 5 steps; can reach both Has following equipment at home: Single point cane, Walker - 2 wheeled, shower chair, Shower bench, bed side commode, and Grab bars  OCCUPATION: applying for disability  PLOF: Independent with community mobility with device  PATIENT GOALS Stand for 15 minutes in order to walk and go grocery shopping for more 2 items. Be able to handle back pain better   OBJECTIVE:   DIAGNOSTIC FINDINGS:  No recent Lumbar images IMPRESSION: 01-20-22 Cervical Minor degenerative changes as detailed above without significant stenosis.   COMPARISON:  June 2022   FINDINGS: Alignment: No significant listhesis.   Vertebrae: Cervical vertebral body heights are maintained. No marrow edema. No suspicious osseous lesion.   Cord: No abnormal signal.   Posterior Fossa, vertebral arteries, paraspinal tissues: Unremarkable.  PATIENT SURVEYS:  Modified Oswestry  40/50   80% disability   SCREENING FOR RED FLAGS: Bowel or bladder incontinence: No Cauda equina syndrome: No  COGNITION:  Overall cognitive status: Within functional limits for tasks assessed     SENSATION: Pt complains of numbness into entire hand of R hand   MUSCLE LENGTH: Hamstrings:  bil tight ness approximaltely 20 degrees   POSTURE: rounded shoulders, forward head, and Pt has had weight loss of 90 lbs in last 4 years  PALPATION:  Lumbar paraspinal tightness.  R elevated pelvis. TTP over R gluteals and R QL Grip strength R average 40 lb and L average 59 lb  R hand dominant LUMBAR ROM:   Active  A/PROM  eval  Flexion Fingers to ankles but pain on rising upright  Extension 15  with pain on R   Right lateral flexion Fingertip to 1 inch above knee jt   Left lateral flexion Finger tip to 2 in above knee jt  Right rotation 50% pain to pulling from shld ot back  Left rotation 50%   (Blank rows = not tested)  LOWER EXTREMITY ROM:     Active  Right eval Left eval  Hip flexion 60 80  Hip extension    Hip abduction    Hip adduction    Hip internal rotation    Hip external rotation    Knee flexion 120 118  Knee extension -5 -5  Ankle dorsiflexion    Ankle plantarflexion    Ankle inversion    Ankle eversion     (Blank rows = not tested)  LOWER EXTREMITY MMT:   Pain with R LE movment   MMT Right eval Left eval  Hip flexion 3- 4-  Hip extension 3- 4-  Hip abduction 3- 3+  Hip adduction    Hip internal rotation    Hip external rotation    Knee flexion 4- 4+  Knee extension 4- 4  Ankle dorsiflexion 3+ 4  Ankle plantarflexion 3/25 3/25   Ankle inversion    Ankle eversion     (Blank rows = not tested)  LUMBAR SPECIAL TESTS:  Straight leg raise test: Positive, Slump test: Negative, and FABER test: Positive Right side pain with movement   FUNCTIONAL TESTS:  5 times sit to stand: 41.08 sec 2 minute walk test: 278 ft Berg Balance Scale: 35/56  must use walker at all times for safety  GAIT: Distance walked: 280 feet Assistive device utilized: Single point cane Level of assistance: SBA Comments: 2 MWT 264ft. Normal for age 3 feet Pt with R antalgic gait with lateral trunk lean to left.  Decreased trunk rotation  Decreased stance time on R   2.13 ft /sec gait velocity   BERG BALANCE  Sitting to Standing: Numbers; 0-4: 2  4. Stands without using hands and stabilize independently  3. Stands independently using hands  2. Stands using hands after multiple trials  1. Min A to stand  0. Mod-Max A to stand Standing unsupported: Numbers; 0-4: 4  4. Stands safely for 2 minutes  3. Stands 2 minutes with supervision  2. Stands 30 seconds unsupported  1. Needs several tries to stand unsupported for 30 seconds  0. Unable to stand unsupported for 30 seconds Sitting unsupported: Numbers; 0-4: 4  4. Sits for 2 minutes independently  3. Sits for 2 minutes with supervision  2. Able to sit 30 seconds  1. Able to sit 10 seconds  0. Unable to sit for 10 seconds Standing to Sitting: Numbers; 0-4:  3 4. Sits safely with minimal use of hands 3. Controls descent with hands 2. Uses back of legs against chair to control descent 1. Sits independently, but uncontrolled descent 0. Needs assistance Transfers: Numbers; 0-4: 3  4. Transfers safely with minor use of hands  3. Transfers safely definite use of hands  2. Transfers with verbal cueing/supervision  1. Needs 1 person assist  0. Needs 2 person assist  Standing with eyes closed: Numbers; 0-4: 3  4. Stands safely for 10 seconds  3. Stands 10 seconds with supervision   2.  Able to stand for 3 seconds  1. Unable to keep eyes closed for 3 seconds, but is safe  0. Needs assist to keep from falling Standing with feet together: Numbers; 0-4: 2 4. Stands for 1 minute safely 3. Stands for 1 minute with supervision 2. Unable to hold for 30 seconds  1. Needs help to attain position but can hold for 15 seconds  0. Needs help to attain position and unable to hold for 15 seconds Reaching forward with outstretched arm: Numbers; 0-4: 3  4. Reaches forward 10 inches  3. Reaches forward 5 inches  2. Reaches forward 2 inches  1. Reaches forward with supervision  0. Loses balance/requires assistace Retrieving object from the floor: Numbers; 0-4: 2 4. Able to pick up easily and safely 3. Able to pick up with supervision 2. Unable to pick up, but reaches within 1-2 inches independently 1. Unable to pick up and needs supervision 0. Unable/needs assistance to keep from falling  Turning to look behind: Numbers; 0-4: 2  4. Looks behind from both sides and weight shifts well  3. Looks behind one side only, other side less weight shift  2. Turns sideways only, maintains balance  1. Needs supervision when turning  0. Needs assistance  Turning 360 degrees: Numbers; 0-4: 2  4. Able to turn in </=4 seconds  3. Able to turn on one side in </= 4 seconds   2. Able to turn slowly, but safely  1. Needs supervision or verbal cueing  0. Needs assistance Place alternate foot on stool: Numbers; 0-4: 2 4. Completes 8 steps in 20 seconds 3. Completes 8 steps in >20 seconds 2. 4 steps without assistance/supervision 1. Completes >2 steps with minimal assist 0. Unable, needs assist to keep from falling Standing with one foot in front: Numbers; 0-4: 2  4. Independent tandem for 30 seconds  3. Independent foot ahead for 30 seconds  2. Independent small step for 30 seconds  1. Needs help to step, but can hold for 15 seconds  0. Loses balance while standing/stepping Standing on one foot:  Numbers; 0-4: 1 4. Holds >10 seconds 3. Holds 5-10 seconds 2. Holds >/=3 seconds  1. Holds <3 seconds 0. Unable   Total Score: 35/56  Pt recommended to use walker at all times for now due to BERG test  TODAY'S TREATMENT  04-25-22 Eval only   PATIENT EDUCATION:  Education details: POC explanation of findings  [facility] aquatics handout for location Person educated: Patient Education method: Chief Technology Officer Education comprehension: verbalized understanding   HOME EXERCISE PROGRAM: To be issued 2nd visit  ASSESSMENT:  CLINICAL IMPRESSION: Patient is a 56 y.o. female who was seen today for physical therapy evaluation and treatment for Lumbar spondylosis but also has symptoms of cervicalgia.  Pt is well know to clinic. Pt with CC of R UE pain and numbness tingling into R UE as well as R LE  radiculopathy down to R calf pain.  Pt recently seen in ED for chest pain from musculoskeletal/cervical radiculopathy.  Pt complains of balance issue and reports having fallen when turning in kitchen 3 times in last 6 months.  BERG 35/56, 257ft (norm 546ft). Pt also presents with R hand ( dominant) weakness compared to L.  Pt would benefit from skilled PT to address fall risk/balance and LE weakness.  Pt would benefit from aquatic therapy to continue exercise without risk of fall.   OBJECTIVE IMPAIRMENTS decreased activity tolerance, decreased balance, decreased mobility, difficulty walking, decreased ROM, decreased strength, improper body mechanics, postural dysfunction, obesity, and pain.   ACTIVITY LIMITATIONS carrying, bending, sitting, standing, squatting, sleeping, stairs, transfers, dressing, and locomotion level  PARTICIPATION LIMITATIONS: driving, shopping, and exercising and   PERSONAL FACTORS L TKA 01-26-21, DDD, cardiac cath 2009, Sciatica, RTC syndrome, plantar fasciitis, carpal tunnel, OA, obesity,  See medical hx are also affecting patient's functional outcome.   REHAB  POTENTIAL: Fair Pt currently has aide to care for needs at home domestic chores and some cooking  CLINICAL DECISION MAKING: Evolving/moderate complexity  EVALUATION COMPLEXITY: Moderate   GOALS: Goals reviewed with patient? Yes  SHORT TERM GOALS: Target date: 05/23/2022  Pt independent with initial HEP Baseline:Pt needs to reinforce HEP and consistency of exercise Goal status: INITIAL  2.  Pt will perform 6 minutes walk test.  Baseline: only able to tolerate 2 minute walk test 278 ft (579 normal)  Pt unable to stand for more than 5-10 min due to pain Goal status: INITIAL  3.  Pt will return to aquatic therapy to update HEP for use on her own at local pool Baseline: Pt currently with no routine exercise program due to pain Goal status: INITIAL  4.  Pt will improve SLS to at least 15 sec on R and L to show improved balance Baseline: unable to tolerate more than 3 sec SLS Goal status: INITIAL    LONG TERM GOALS: Target date: 06/20/2022  Pt will be independent with advanced HEP on land in order to maintain strength Baseline: Pt currently not doing any routine exercise Goal status: INITIAL  2.  Return to walking/standing for without stopping using LRAD in order to shop Baseline: Currently not doing any exercise and cannot stand or walk greater than 5-10 minutes and must use riding grocery cart Goal status: INITIAL  3.  Pt will use pain management in order to sleep for 4 or more hour of uninterrupted sleep Baseline: pt awakens every 2 hours due to pain and wakes whenever she turns in bed Goal status: INITIAL  4.  Pt will be able to negotiate steps without exacerbation of pain greater than 3/10 Baseline: 7/10 currently in eval but can rise to 10/10 Goal status: INITIAL  5.  BERG balance with increase at least 10 points to 45/56 to show decreased risk of fall with LRAD Baseline: eval 35/56 using cane but told to utlize walker at all times Goal status: INITIAL  6.   ODI will improve  to at least 30/50 to show improvement in functional mobility Baseline: 40/50 80% disabilty Goal status: INITIAL  7. Pt will demonstrate floor to stand transfer safely for falls preparedness and decrease risk of fall  Baseline: pt unable to transfer floor to stand and has had 3 falls in last 6 months.  Goal status INITIAL  8. Pt will incorporate regular land based exercise in order to maintain strength for ADL/household tasks  Baseline:  Pt unable to do any regular exercise due to pain and weakness.  Goal status INITIAL   PLAN: PT FREQUENCY: 2x/week  PT DURATION: 8 weeks  PLANNED INTERVENTIONS: Therapeutic exercises, Therapeutic activity, Neuromuscular re-education, Balance training, Gait training, Patient/Family education, and Joint mobilization.  PLAN FOR NEXT SESSION: issue low back HEP basic  Check all possible CPT codes: 40981 - PT Re-evaluation, 97110- Therapeutic Exercise, [phone]- Neuro Re-education, [phone] - Gait Training, [phone] - Manual Therapy, [phone] - Therapeutic Activities, [phone] - Self Care, [phone] - Electrical stimulation (Manual), [id number] - Physical performance training, and [id number] - Aquatic therapy     If treatment provided at initial evaluation, no treatment charged due to lack of authorization.      Garen Lah, PT, ATRIC Certified Exercise Expert for the Aging Adult  04/25/22 1:44 PM Phone: [phone] Fax: [phone]

## 2022-04-25 ENCOUNTER — Ambulatory Visit: Payer: Medicaid Other | Attending: Sports Medicine | Admitting: Physical Therapy

## 2022-04-25 DIAGNOSIS — R2681 Unsteadiness on feet: Secondary | ICD-10-CM | POA: Diagnosis not present

## 2022-04-25 DIAGNOSIS — R296 Repeated falls: Secondary | ICD-10-CM

## 2022-04-25 DIAGNOSIS — M6281 Muscle weakness (generalized): Secondary | ICD-10-CM

## 2022-04-25 DIAGNOSIS — G8929 Other chronic pain: Secondary | ICD-10-CM | POA: Diagnosis not present

## 2022-04-25 DIAGNOSIS — M5440 Lumbago with sciatica, unspecified side: Secondary | ICD-10-CM | POA: Insufficient documentation

## 2022-04-25 DIAGNOSIS — M1712 Unilateral primary osteoarthritis, left knee: Secondary | ICD-10-CM | POA: Diagnosis not present

## 2022-04-25 DIAGNOSIS — M542 Cervicalgia: Secondary | ICD-10-CM

## 2022-04-25 NOTE — Patient Instructions (Signed)
Aquatic Therapy at [facility]-  What to Expect!  Where:   [facility] [place], [place] [address] Rehab phone [phone]  NOTE:  You will receive an automated phone message reminding you of your appt and it will say the appointment is at the [facility] clinic.          How to Prepare: Please make sure you drink 8 ounces of water about one hour prior to your pool session A caregiver may attend if needed with the patient to help assist as needed. A caregiver can sit in the pool room on chair. Please arrive IN YOUR SUIT and 15 minutes prior to your appointment - this helps to avoid delays in starting your session. Please make sure to attend to any toileting needs prior to entering the pool Bowie rooms for changing are provided.   There is direct access to the pool deck form the locker room.  You can lock your belongings in a locker with lock provided. Once on the pool deck your therapist will ask if you have signed the Patient  Consent and Assignment of Benefits form before beginning treatment Your therapist may take your blood pressure prior to, during and after your session if indicated We usually try and create a home exercise program based on activities we do in the pool.  Please be thinking about who might be able to assist you in the pool should you need to participate in an aquatic home exercise program at the time of discharge if you need assistance.  Some patients do not want to or do not have the ability to participate in an aquatic home program - this is not a barrier in any way to you participating in aquatic therapy as part of your current therapy plan! After Discharge from PT, you can continue using home program at  the [facility], there is a drop-in fee for $5 ($45 a month)or for 60 years  or older $4.00 ($40 a month for seniors ) or any local [facility] pool.  Memberships for purchase are  available for gym/pool at [facility] LAST AQUATIC VISIT.  PLEASE MAKE SURE THAT YOU HAVE A [address]  APPOINTMENT SCHEDULED.   About the pool: Pool is located approximately 500 FT from the entrance of the building.  Please bring a support person if you need assistance traveling this      distance.   Your therapist will assist you in entering the water; there are two ways to           enter: stairs with railings, and a mechanical lift. Your therapist will determine the most appropriate way for you.  Water temperature is usually between 88-90 degrees  There may be up to 2 other swimmers in the pool at the same time  The pool deck is tile, please wear shoes with good traction if you prefer not to be barefoot.    Contact Info:  For appointment scheduling and cancellations:         Please call the [facility]       All sessions are 45 minutes  Garen Lah, PT, ATRIC Certified Exercise Expert for the Aging Adult  04/25/22 10:20 AM Phone: [phone] Fax: [phone]

## 2022-04-26 DIAGNOSIS — M1712 Unilateral primary osteoarthritis, left knee: Secondary | ICD-10-CM | POA: Diagnosis not present

## 2022-04-27 DIAGNOSIS — M1712 Unilateral primary osteoarthritis, left knee: Secondary | ICD-10-CM | POA: Diagnosis not present

## 2022-04-28 DIAGNOSIS — M1712 Unilateral primary osteoarthritis, left knee: Secondary | ICD-10-CM | POA: Diagnosis not present

## 2022-04-29 DIAGNOSIS — M1712 Unilateral primary osteoarthritis, left knee: Secondary | ICD-10-CM | POA: Diagnosis not present

## 2022-04-30 DIAGNOSIS — M1712 Unilateral primary osteoarthritis, left knee: Secondary | ICD-10-CM | POA: Diagnosis not present

## 2022-05-01 DIAGNOSIS — M1712 Unilateral primary osteoarthritis, left knee: Secondary | ICD-10-CM | POA: Diagnosis not present

## 2022-05-02 ENCOUNTER — Ambulatory Visit: Payer: Medicaid Other

## 2022-05-02 DIAGNOSIS — M1712 Unilateral primary osteoarthritis, left knee: Secondary | ICD-10-CM | POA: Diagnosis not present

## 2022-05-03 ENCOUNTER — Ambulatory Visit: Payer: Medicaid Other

## 2022-05-03 DIAGNOSIS — M5440 Lumbago with sciatica, unspecified side: Secondary | ICD-10-CM | POA: Diagnosis not present

## 2022-05-03 DIAGNOSIS — R2681 Unsteadiness on feet: Secondary | ICD-10-CM | POA: Diagnosis not present

## 2022-05-03 DIAGNOSIS — M6281 Muscle weakness (generalized): Secondary | ICD-10-CM

## 2022-05-03 DIAGNOSIS — G8929 Other chronic pain: Secondary | ICD-10-CM

## 2022-05-03 DIAGNOSIS — R296 Repeated falls: Secondary | ICD-10-CM | POA: Diagnosis not present

## 2022-05-03 DIAGNOSIS — M542 Cervicalgia: Secondary | ICD-10-CM

## 2022-05-03 DIAGNOSIS — M1712 Unilateral primary osteoarthritis, left knee: Secondary | ICD-10-CM | POA: Diagnosis not present

## 2022-05-03 NOTE — Therapy (Signed)
OUTPATIENT PHYSICAL THERAPY TREATMENT NOTE   Patient Name: Katie [name] MRN: [id number] DOB:Dec 19, 1965, 56 y.o., female Today's Date: 05/03/2022  PCP: Rometta Emery, MD REFERRING PROVIDER: Rometta Emery, MD  END OF SESSION:   PT End of Session - 05/03/22 1631     Visit Number 2    Number of Visits 16    Date for PT Re-Evaluation 06/20/22    Authorization Type [employer] NO tx, estim unattended , [facility]    PT Start Time 1631   patient late   PT Stop Time 1700    PT Time Calculation (min) 29 min    Activity Tolerance Patient tolerated treatment well    Behavior During Therapy [facility] for tasks assessed/performed             Past Medical History:  Diagnosis Date   Anxiety    Back pain    Bipolar 1 disorder (HCC)    Chronic pain entered 08/15/2015   Treated with Oxycodone   Depression    Depression    Dyspnea    History of acute bronchitis    HTN (hypertension)    OA (osteoarthritis)    Obesity    PE (pulmonary embolism)    oct 2014   Sciatica    Past Surgical History:  Procedure Laterality Date   ADENOIDECTOMY     BIOPSY STOMACH     CARDIAC CATHETERIZATION  2009   normal; in [place]   CESAREAN SECTION  2002   x 2, 1997 and 2002   CHOLECYSTECTOMY  2007   KNEE SURGERY Right 2005   TONSILLECTOMY     TOTAL KNEE ARTHROPLASTY Left 01/27/2020   Procedure: TOTAL KNEE ARTHROPLASTY;  Surgeon: [name], MD;  Location: [facility];  Service: Orthopedics;  Laterality: Left;   TYMPANOSTOMY TUBE PLACEMENT     Patient Active Problem List   Diagnosis Date Noted   Allergic reaction caused by a drug 07/26/2021   Primary osteoarthritis of left knee 12/29/2019   Morbid obesity (HCC)    Bradycardia    Pulmonary embolism on long-term anticoagulation therapy (HCC) 04/06/2016   Pain in the chest    Chronic pain    Chest wall pain    Pulmonary embolism (HCC) 07/31/2013   Vocal cord dysfunction 02/28/2012   Dyspnea 01/17/2012   Chest pain 11/25/2011    Hypokalemia 11/25/2011   GERD (gastroesophageal reflux disease) 03/08/2011   DYSMENORRHEA 11/10/2010   PLANTAR FASCIITIS 09/14/2010   OSTEOARTHRITIS, KNEES, BILATERAL, 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 01/03/2007   Irritable bowel syndrome 01/03/2007    REFERRING DIAG: Lumbar radicular pain & spondylosis  THERAPY DIAG:  Chronic right-sided low back pain with sciatica, sciatica laterality unspecified  Unsteadiness on feet  Muscle weakness (generalized)  Repeated falls  Cervicalgia  Rationale for Evaluation and Treatment Rehabilitation  PERTINENT HISTORY: L TKA 01-26-21, DDD, cardiac cath 2009, Sciatica, RTC syndrome, plantar fasciitis, carpal tunnel, OA, obesisty,  Se medical hx  PRECAUTIONS: Other: ALERGIC to percocet, hydrocodone, Vicodin, Aleve  SUBJECTIVE: Patient reports she got in the pool today for some exercises for about an hour and a half and it felt pretty good. She reports the back is doing better,  but the left knee is bothering her.   PAIN:  Are you having pain? Yes: NPRS scale: 6/10 Pain location: left knee Pain description: swollen Aggravating factors: movement Relieving factors: rest   OBJECTIVE: (objective measures completed at initial evaluation unless otherwise dated)   OBJECTIVE:    DIAGNOSTIC FINDINGS:  No recent Lumbar images IMPRESSION: 01-20-22 Cervical Minor degenerative changes as detailed above without significant stenosis.  COMPARISON:  June 2022   FINDINGS: Alignment: No significant listhesis.   Vertebrae: Cervical vertebral body heights are maintained. No marrow edema. No suspicious osseous lesion.   Cord: No abnormal signal.    Posterior Fossa, vertebral arteries, paraspinal tissues: Unremarkable.   PATIENT SURVEYS:  Modified Oswestry 40/50   80% disability    SCREENING FOR RED FLAGS: Bowel or bladder incontinence: No Cauda equina syndrome: No   COGNITION:           Overall cognitive status: Within functional limits for tasks assessed                          SENSATION: Pt complains of numbness into entire hand of R hand    MUSCLE LENGTH: Hamstrings:  bil tight ness approximaltely 20 degrees     POSTURE: rounded shoulders, forward head, and Pt has had weight loss of 90 lbs in last 4 years   PALPATION:  Lumbar paraspinal tightness.  R elevated pelvis. TTP over R gluteals and R QL Grip strength R average 40 lb and L average 59 lb  R hand dominant LUMBAR ROM:    Active  A/PROM  eval  Flexion Fingers to ankles but pain on rising upright  Extension 15  with pain on R   Right lateral flexion Fingertip to 1 inch above knee jt    Left lateral flexion Finger tip to 2 in above knee jt  Right rotation 50% pain to pulling from shld ot back  Left rotation 50%   (Blank rows = not tested)   LOWER EXTREMITY ROM:      Active  Right eval Left eval  Hip flexion 60 80  Hip extension      Hip abduction      Hip adduction      Hip internal rotation      Hip external rotation      Knee flexion 120 118  Knee extension -5 -5  Ankle dorsiflexion      Ankle plantarflexion      Ankle inversion      Ankle eversion       (Blank rows = not tested)   LOWER EXTREMITY MMT:   Pain with R LE movment    MMT Right eval Left eval  Hip flexion 3- 4-  Hip extension 3- 4-  Hip abduction 3- 3+  Hip adduction      Hip internal rotation      Hip external rotation      Knee flexion 4- 4+  Knee extension 4- 4  Ankle dorsiflexion 3+ 4  Ankle plantarflexion 3/25 3/25  Ankle inversion      Ankle eversion       (Blank rows = not tested)   LUMBAR SPECIAL TESTS:  Straight leg raise test: Positive, Slump test:  Negative, and FABER test: Positive Right side pain with movement    FUNCTIONAL TESTS:  5 times sit to stand: 41.08 sec 2 minute walk test: 278 ft Berg Balance Scale: 35/56  must use walker at all times for  safety   GAIT: Distance walked: 280 feet Assistive device utilized: Single point cane Level of assistance: SBA Comments: 2 MWT 219ft. Normal for age 6 feet Pt with R antalgic gait with lateral trunk lean to left.  Decreased trunk rotation  Decreased stance time on R    2.13 ft /sec gait velocity    BERG BALANCE   Sitting to Standing: Numbers; 0-4: 2            4. Stands without using hands and stabilize independently            3. Stands independently using hands            2. Stands using hands after multiple trials            1. Min A to stand            0. Mod-Max A to stand Standing unsupported: Numbers; 0-4: 4            4. Stands safely for 2 minutes            3. Stands 2 minutes with supervision            2. Stands 30 seconds unsupported            1. Needs several tries to stand unsupported for 30 seconds            0. Unable to stand unsupported for 30 seconds Sitting unsupported: Numbers; 0-4: 4            4. Sits for 2 minutes independently            3. Sits for 2 minutes with supervision            2. Able to sit 30 seconds            1. Able to sit 10 seconds            0. Unable to sit for 10 seconds Standing to Sitting: Numbers; 0-4: 3 4. Sits safely with minimal use of hands 3. Controls descent with hands 2. Uses back of legs against chair to control descent 1. Sits independently, but uncontrolled descent 0. Needs assistance Transfers: Numbers; 0-4: 3            4. Transfers safely with minor use of hands            3. Transfers safely definite use of hands            2. Transfers with verbal cueing/supervision            1. Needs 1 person assist            0. Needs 2 person assist  Standing with eyes closed: Numbers; 0-4: 3            4.  Stands safely for 10 seconds            3. Stands 10 seconds with supervision             2. Able to stand for 3 seconds            1. Unable to keep eyes closed for 3 seconds, but is safe            0. Needs assist to keep from falling Standing with feet together: Numbers; 0-4: 2 4. Stands for 1 minute safely 3. Stands for 1 minute with supervision 2. Unable to hold for 30 seconds  1. Needs help to attain position but can hold for 15 seconds            0. Needs help to attain position and unable to hold for 15 seconds Reaching forward with outstretched arm: Numbers; 0-4: 3            4. Reaches forward 10 inches            3. Reaches forward 5 inches            2. Reaches forward 2 inches            1. Reaches forward with supervision            0. Loses balance/requires assistace Retrieving object from the floor: Numbers; 0-4: 2 4. Able to pick up easily and safely 3. Able to pick up with supervision 2. Unable to pick up, but reaches within 1-2 inches independently 1. Unable to pick up and needs supervision 0. Unable/needs assistance to keep from falling  Turning to look behind: Numbers; 0-4: 2            4. Looks behind from both sides and weight shifts well            3. Looks behind one side only, other side less weight shift            2. Turns sideways only, maintains balance            1. Needs supervision when turning            0. Needs assistance  Turning 360 degrees: Numbers; 0-4: 2            4. Able to turn in </=4 seconds            3. Able to turn on one side in </= 4 seconds             2. Able to turn slowly, but safely            1. Needs supervision or verbal cueing            0. Needs assistance Place alternate foot on stool: Numbers; 0-4: 2 4. Completes 8 steps in 20 seconds 3. Completes 8 steps in >20 seconds 2. 4 steps without assistance/supervision 1. Completes >2 steps with minimal assist 0. Unable, needs assist to keep from  falling Standing with one foot in front: Numbers; 0-4: 2            4. Independent tandem for 30 seconds            3. Independent foot ahead for 30 seconds            2. Independent small step for 30 seconds            1. Needs help to step, but can hold for 15 seconds            0. Loses balance while standing/stepping Standing on one foot: Numbers; 0-4: 1 4. Holds >10 seconds 3. Holds 5-10 seconds 2. Holds >/=3 seconds  1. Holds <3 seconds 0. Unable    Total Score: 35/56  Pt recommended to use walker at all times for now due to BERG test   TODAY'S TREATMENT  [facility] Adult PT Treatment:  DATE: 05/03/22 Therapeutic Exercise: Supine posterior pelvic tilt 1 x 10 SLR partial range 1 x 10 bilateral  Clamshell 1 x 10 bilateral green band  Sit to stand raised height 1 x 10  LAQ 1 x 10 bilateral  Standing march 1 x 10 Standing hip abduction 1 x 10 bilateral      PATIENT EDUCATION:  Education details: HEP Person educated: Patient Education method: Musician, cues, and Handouts Education comprehension: verbalized understanding,cues, returned demo, further instruction required      HOME EXERCISE PROGRAM:   Access Code: [email or web address] URL: [email or web address] Date: 05/03/2022 Prepared by: Gwendolyn Grant  Exercises - Supine Posterior Pelvic Tilt  - 1 x daily - 7 x weekly - 2 sets - 10 reps - Small Range Straight Leg Raise  - 1 x daily - 7 x weekly - 2 sets - 10 reps - Clamshell with Resistance  - 1 x daily - 7 x weekly - 2 sets - 10 reps - Sit to Stand  - 1 x daily - 7 x weekly - 2 sets - 10 reps - Seated Long Arc Quad  - 1 x daily - 7 x weekly - 2 sets - 10 reps - Standing Marching  - 1 x daily - 7 x weekly - 2 sets - 10 reps - Standing Hip Abduction with Counter Support  - 1 x daily - 7 x weekly - 2 sets - 10 reps ASSESSMENT:   CLINICAL IMPRESSION: Session was limited as patient was late for scheduled appointment.  Today's session focused on implementing/issuing initial HEP for generalized strengthening of the core/LE. She tolerated session well today without an increase in pain, though due to weakness was only able to complete partial range of some of the prescribed strengthening exercises.      OBJECTIVE IMPAIRMENTS decreased activity tolerance, decreased balance, decreased mobility, difficulty walking, decreased ROM, decreased strength, improper body mechanics, postural dysfunction, obesity, and pain.    ACTIVITY LIMITATIONS carrying, bending, sitting, standing, squatting, sleeping, stairs, transfers, dressing, and locomotion level   PARTICIPATION LIMITATIONS: driving, shopping, and exercising and    PERSONAL FACTORS L TKA 01-26-21, DDD, cardiac cath 2009, Sciatica, RTC syndrome, plantar fasciitis, carpal tunnel, OA, obesity,  See medical hx are also affecting patient's functional outcome.    REHAB POTENTIAL: Fair Pt currently has aide to care for needs at home domestic chores and some cooking   CLINICAL DECISION MAKING: Evolving/moderate complexity   EVALUATION COMPLEXITY: Moderate     GOALS: Goals reviewed with patient? Yes   SHORT TERM GOALS: Target date: 05/23/2022   Pt independent with initial HEP Baseline:Pt needs to reinforce HEP and consistency of exercise Goal status: INITIAL   2.  Pt will perform 6 minutes walk test.  Baseline: only able to tolerate 2 minute walk test 278 ft (579 normal)  Pt unable to stand for more than 5-10 min due to pain Goal status: INITIAL   3.  Pt will return to aquatic therapy to update HEP for use on her own at local pool Baseline: Pt currently with no routine exercise program due to pain Goal status: INITIAL   4.  Pt will improve SLS to at least 15 sec on R and L to show improved balance Baseline: unable to tolerate more than 3 sec SLS Goal status: INITIAL       LONG TERM GOALS: Target date: 06/20/2022   Pt will be independent with advanced HEP on  land in order to maintain strength Baseline: Pt currently not doing  any routine exercise Goal status: INITIAL   2.  Return to walking/standing for without stopping using LRAD in order to shop Baseline: Currently not doing any exercise and cannot stand or walk greater than 5-10 minutes and must use riding grocery cart Goal status: INITIAL   3.  Pt will use pain management in order to sleep for 4 or more hour of uninterrupted sleep Baseline: pt awakens every 2 hours due to pain and wakes whenever she turns in bed Goal status: INITIAL   4.  Pt will be able to negotiate steps without exacerbation of pain greater than 3/10 Baseline: 7/10 currently in eval but can rise to 10/10 Goal status: INITIAL   5.  BERG balance with increase at least 10 points to 45/56 to show decreased risk of fall with LRAD Baseline: eval 35/56 using cane but told to utlize walker at all times Goal status: INITIAL   6.  ODI will improve  to at least 30/50 to show improvement in functional mobility Baseline: 40/50 80% disabilty Goal status: INITIAL   7. Pt will demonstrate floor to stand transfer safely for falls preparedness and decrease risk of fall            Baseline: pt unable to transfer floor to stand and has had 3 falls in last 6 months.            Goal status INITIAL   8. Pt will incorporate regular land based exercise in order to maintain strength for ADL/household tasks            Baseline:  Pt unable to do any regular exercise due to pain and weakness.            Goal status INITIAL     PLAN: PT FREQUENCY: 2x/week   PT DURATION: 8 weeks   PLANNED INTERVENTIONS: Therapeutic exercises, Therapeutic activity, Neuromuscular re-education, Balance training, Gait training, Patient/Family education, and Joint mobilization.   PLAN FOR NEXT SESSION: progress strength and balance as tolerated. Review HEP.    Letitia Libra, PT, DPT, ATC 05/03/22 5:00 PM

## 2022-05-04 DIAGNOSIS — M1712 Unilateral primary osteoarthritis, left knee: Secondary | ICD-10-CM | POA: Diagnosis not present

## 2022-05-05 DIAGNOSIS — M1712 Unilateral primary osteoarthritis, left knee: Secondary | ICD-10-CM | POA: Diagnosis not present

## 2022-05-06 DIAGNOSIS — M1712 Unilateral primary osteoarthritis, left knee: Secondary | ICD-10-CM | POA: Diagnosis not present

## 2022-05-07 DIAGNOSIS — M1712 Unilateral primary osteoarthritis, left knee: Secondary | ICD-10-CM | POA: Diagnosis not present

## 2022-05-08 DIAGNOSIS — M1712 Unilateral primary osteoarthritis, left knee: Secondary | ICD-10-CM | POA: Diagnosis not present

## 2022-05-09 DIAGNOSIS — M1712 Unilateral primary osteoarthritis, left knee: Secondary | ICD-10-CM | POA: Diagnosis not present

## 2022-05-10 ENCOUNTER — Ambulatory Visit: Payer: Medicaid Other | Attending: Sports Medicine

## 2022-05-10 DIAGNOSIS — M25662 Stiffness of left knee, not elsewhere classified: Secondary | ICD-10-CM | POA: Diagnosis not present

## 2022-05-10 DIAGNOSIS — R296 Repeated falls: Secondary | ICD-10-CM | POA: Diagnosis not present

## 2022-05-10 DIAGNOSIS — M6281 Muscle weakness (generalized): Secondary | ICD-10-CM | POA: Diagnosis not present

## 2022-05-10 DIAGNOSIS — M542 Cervicalgia: Secondary | ICD-10-CM | POA: Insufficient documentation

## 2022-05-10 DIAGNOSIS — G8929 Other chronic pain: Secondary | ICD-10-CM | POA: Insufficient documentation

## 2022-05-10 DIAGNOSIS — R252 Cramp and spasm: Secondary | ICD-10-CM | POA: Insufficient documentation

## 2022-05-10 DIAGNOSIS — M5440 Lumbago with sciatica, unspecified side: Secondary | ICD-10-CM | POA: Insufficient documentation

## 2022-05-10 DIAGNOSIS — M25562 Pain in left knee: Secondary | ICD-10-CM | POA: Insufficient documentation

## 2022-05-10 DIAGNOSIS — R262 Difficulty in walking, not elsewhere classified: Secondary | ICD-10-CM | POA: Insufficient documentation

## 2022-05-10 DIAGNOSIS — R2681 Unsteadiness on feet: Secondary | ICD-10-CM | POA: Diagnosis not present

## 2022-05-10 DIAGNOSIS — M1712 Unilateral primary osteoarthritis, left knee: Secondary | ICD-10-CM | POA: Diagnosis not present

## 2022-05-10 NOTE — Therapy (Signed)
OUTPATIENT PHYSICAL THERAPY TREATMENT NOTE   Patient Name: Katie [name] MRN: [id number] DOB:[birth year], 56 y.o., female Today's Date: 05/10/2022  PCP: Rometta Emery, MD REFERRING PROVIDER: Rometta Emery, MD  END OF SESSION:   PT End of Session - 05/10/22 1615     Visit Number 3    Number of Visits 16    Date for PT Re-Evaluation 06/20/22    Authorization Type [employer] NO tx, estim unattended , [facility]    PT Start Time 1615    PT Stop Time 1655    PT Time Calculation (min) 40 min    Activity Tolerance Patient tolerated treatment well    Behavior During Therapy [facility] for tasks assessed/performed              Past Medical History:  Diagnosis Date   Anxiety    Back pain    Bipolar 1 disorder (HCC)    Chronic pain entered 08/15/2015   Treated with Oxycodone   Depression    Depression    Dyspnea    History of acute bronchitis    HTN (hypertension)    OA (osteoarthritis)    Obesity    PE (pulmonary embolism)    oct 2014   Sciatica    Past Surgical History:  Procedure Laterality Date   ADENOIDECTOMY     BIOPSY STOMACH     CARDIAC CATHETERIZATION  2009   normal; in [place]   CESAREAN SECTION  2002   x 2, 1997 and 2002   CHOLECYSTECTOMY  2007   KNEE SURGERY Right 2005   TONSILLECTOMY     TOTAL KNEE ARTHROPLASTY Left 01/27/2020   Procedure: TOTAL KNEE ARTHROPLASTY;  Surgeon: [name], MD;  Location: [facility];  Service: Orthopedics;  Laterality: Left;   TYMPANOSTOMY TUBE PLACEMENT     Patient Active Problem List   Diagnosis Date Noted   Allergic reaction caused by a drug 07/26/2021   Primary osteoarthritis of left knee 12/29/2019   Morbid obesity (HCC)    Bradycardia    Pulmonary embolism on long-term anticoagulation therapy (HCC) 04/06/2016   Pain in the chest    Chronic pain    Chest wall pain    Pulmonary embolism (HCC) 07/31/2013   Vocal cord dysfunction 02/28/2012   Dyspnea 01/17/2012   Chest pain 11/25/2011    Hypokalemia 11/25/2011   GERD (gastroesophageal reflux disease) 03/08/2011   DYSMENORRHEA 11/10/2010   PLANTAR FASCIITIS 09/14/2010   OSTEOARTHRITIS, KNEES, BILATERAL, 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 01/03/2007   Irritable bowel syndrome 01/03/2007    REFERRING DIAG: Lumbar radicular pain & spondylosis  THERAPY DIAG:  Chronic right-sided low back pain with sciatica, sciatica laterality unspecified  Unsteadiness on feet  Muscle weakness (generalized)  Repeated falls  Cervicalgia  Rationale for Evaluation and Treatment Rehabilitation  PERTINENT HISTORY: L TKA 01-26-21, DDD, cardiac cath 2009, Sciatica, RTC syndrome, plantar fasciitis, carpal tunnel, OA, obesisty,  Se medical hx  PRECAUTIONS: Other: ALERGIC to percocet, hydrocodone, Vicodin, Aleve  SUBJECTIVE: Patient reports she is still having a lot of pain in her L knee. She reports HEP compliance. No new falls.  PAIN:  Are you having pain? Yes: NPRS scale: 6/10  Pain location: left knee Pain description: swollen Aggravating factors: movement Relieving factors: rest   OBJECTIVE: (objective measures completed at initial evaluation unless otherwise dated)   OBJECTIVE:    DIAGNOSTIC FINDINGS:  No recent Lumbar images IMPRESSION: 01-20-22 Cervical Minor degenerative changes as detailed above without significant stenosis.  COMPARISON:  June 2022   FINDINGS: Alignment: No significant listhesis.   Vertebrae: Cervical vertebral body heights are maintained. No marrow edema. No suspicious osseous lesion.   Cord: No abnormal signal.   Posterior Fossa, vertebral arteries, paraspinal tissues: Unremarkable.   PATIENT SURVEYS:   Modified Oswestry 40/50   80% disability    SCREENING FOR RED FLAGS: Bowel or bladder incontinence: No Cauda equina syndrome: No   COGNITION:           Overall cognitive status: Within functional limits for tasks assessed                          SENSATION: Pt complains of numbness into entire hand of R hand    MUSCLE LENGTH: Hamstrings:  bil tight ness approximaltely 20 degrees     POSTURE: rounded shoulders, forward head, and Pt has had weight loss of 90 lbs in last 4 years   PALPATION:  Lumbar paraspinal tightness.  R elevated pelvis. TTP over R gluteals and R QL Grip strength R average 40 lb and L average 59 lb  R hand dominant LUMBAR ROM:    Active  A/PROM  eval  Flexion Fingers to ankles but pain on rising upright  Extension 15  with pain on R   Right lateral flexion Fingertip to 1 inch above knee jt    Left lateral flexion Finger tip to 2 in above knee jt  Right rotation 50% pain to pulling from shld ot back  Left rotation 50%   (Blank rows = not tested)   LOWER EXTREMITY ROM:      Active  Right eval Left eval  Hip flexion 60 80  Hip extension      Hip abduction      Hip adduction      Hip internal rotation      Hip external rotation      Knee flexion 120 118  Knee extension -5 -5  Ankle dorsiflexion      Ankle plantarflexion      Ankle inversion      Ankle eversion       (Blank rows = not tested)   LOWER EXTREMITY MMT:   Pain with R LE movment    MMT Right eval Left eval  Hip flexion 3- 4-  Hip extension 3- 4-  Hip abduction 3- 3+  Hip adduction      Hip internal rotation      Hip external rotation      Knee flexion 4- 4+  Knee extension 4- 4  Ankle dorsiflexion 3+ 4  Ankle plantarflexion 3/25 3/25  Ankle inversion      Ankle eversion       (Blank rows = not tested)   LUMBAR SPECIAL TESTS:  Straight leg raise test: Positive, Slump test: Negative, and FABER test: Positive Right side pain with movement    FUNCTIONAL TESTS:  5 times  sit to stand: 41.08 sec 2 minute walk test: 278 ft Berg Balance Scale: 35/56  must use walker at all times for safety   GAIT: Distance walked: 280 feet Assistive device utilized: Single point cane Level of assistance: SBA Comments:  2 MWT 248ft. Normal for age 74 feet Pt with R antalgic gait with lateral trunk lean to left.  Decreased trunk rotation  Decreased stance time on R    2.13 ft /sec gait velocity    BERG BALANCE   Sitting to Standing: Numbers; 0-4: 2            4. Stands without using hands and stabilize independently            3. Stands independently using hands            2. Stands using hands after multiple trials            1. Min A to stand            0. Mod-Max A to stand Standing unsupported: Numbers; 0-4: 4            4. Stands safely for 2 minutes            3. Stands 2 minutes with supervision            2. Stands 30 seconds unsupported            1. Needs several tries to stand unsupported for 30 seconds            0. Unable to stand unsupported for 30 seconds Sitting unsupported: Numbers; 0-4: 4            4. Sits for 2 minutes independently            3. Sits for 2 minutes with supervision            2. Able to sit 30 seconds            1. Able to sit 10 seconds            0. Unable to sit for 10 seconds Standing to Sitting: Numbers; 0-4: 3 4. Sits safely with minimal use of hands 3. Controls descent with hands 2. Uses back of legs against chair to control descent 1. Sits independently, but uncontrolled descent 0. Needs assistance Transfers: Numbers; 0-4: 3            4. Transfers safely with minor use of hands            3. Transfers safely definite use of hands            2. Transfers with verbal cueing/supervision            1. Needs 1 person assist            0. Needs 2 person assist  Standing with eyes closed: Numbers; 0-4: 3            4. Stands safely for 10 seconds            3. Stands 10 seconds with supervision             2. Able to  stand for 3 seconds            1. Unable to keep eyes closed for 3 seconds, but is safe            0. Needs assist to keep from falling Standing with feet together: Numbers; 0-4: 2 4. Stands for 1 minute safely 3. Stands for 1 minute with supervision 2. Unable to hold for 30 seconds            1. Needs help to attain position but can hold for  15 seconds            0. Needs help to attain position and unable to hold for 15 seconds Reaching forward with outstretched arm: Numbers; 0-4: 3            4. Reaches forward 10 inches            3. Reaches forward 5 inches            2. Reaches forward 2 inches            1. Reaches forward with supervision            0. Loses balance/requires assistace Retrieving object from the floor: Numbers; 0-4: 2 4. Able to pick up easily and safely 3. Able to pick up with supervision 2. Unable to pick up, but reaches within 1-2 inches independently 1. Unable to pick up and needs supervision 0. Unable/needs assistance to keep from falling  Turning to look behind: Numbers; 0-4: 2            4. Looks behind from both sides and weight shifts well            3. Looks behind one side only, other side less weight shift            2. Turns sideways only, maintains balance            1. Needs supervision when turning            0. Needs assistance  Turning 360 degrees: Numbers; 0-4: 2            4. Able to turn in </=4 seconds            3. Able to turn on one side in </= 4 seconds             2. Able to turn slowly, but safely            1. Needs supervision or verbal cueing            0. Needs assistance Place alternate foot on stool: Numbers; 0-4: 2 4. Completes 8 steps in 20 seconds 3. Completes 8 steps in >20 seconds 2. 4 steps without assistance/supervision 1. Completes >2 steps with minimal assist 0. Unable, needs assist to keep from falling Standing with one foot in front: Numbers; 0-4: 2            4. Independent tandem for 30 seconds             3. Independent foot ahead for 30 seconds            2. Independent small step for 30 seconds            1. Needs help to step, but can hold for 15 seconds            0. Loses balance while standing/stepping Standing on one foot: Numbers; 0-4: 1 4. Holds >10 seconds 3. Holds 5-10 seconds 2. Holds >/=3 seconds  1. Holds <3 seconds 0. Unable    Total Score: 35/56  Pt recommended to use walker at all times for now due to BERG test   TODAY'S TREATMENT  [facility]  [facility]: [facility]10/[facility] Therapeutic Exercise: Nustep level 5 x 5 mins Supine posterior pelvic tilt 5" hold 2 x 10 Bridges x10 (hamstring cramp) SLR partial range 2 x 10 bilateral  Sidelying clamshell 1 x 10 bilateral green band  Sit to stand arms crossed 1 x 10  LAQ 2 x 10 bilateral  Standing march 2 x 10 Standing hip abduction 2 x 10 bilateral  Neuromuscular re-ed: Romberg stance 2x30" 1 set with head turns Semi-tandem stance x30" BIL   [facility] Adult PT Treatment:                                                DATE: 05/03/22 Therapeutic Exercise: Supine posterior pelvic tilt 1 x 10 SLR partial range 1 x 10 bilateral  Clamshell 1 x 10 bilateral green band  Sit to stand raised height 1 x 10  LAQ 1 x 10 bilateral  Standing march 1 x 10 Standing hip abduction 1 x 10 bilateral      PATIENT EDUCATION:  Education details: HEP Person educated: Patient Education method: Event organiser, cues, and Handouts Education comprehension: verbalized understanding,cues, returned demo, further instruction required      HOME EXERCISE PROGRAM:   Access Code: [email or web address] URL: [email or web address] Date: 05/03/2022 Prepared by: Letitia Libra  Exercises - Supine Posterior Pelvic Tilt  - 1 x daily - 7 x weekly - 2 sets - 10 reps - Small Range Straight Leg Raise  - 1 x daily - 7 x weekly - 2 sets - 10 reps - Clamshell with Resistance  - 1 x daily - 7 x weekly - 2 sets -  10 reps - Sit to Stand  - 1 x daily - 7 x weekly - 2 sets - 10 reps - Seated Long Arc Quad  - 1 x daily - 7 x weekly - 2 sets - 10 reps - Standing Marching  - 1 x daily - 7 x weekly - 2 sets - 10 reps - Standing Hip Abduction with Counter Support  - 1 x daily - 7 x weekly - 2 sets - 10 reps ASSESSMENT:   CLINICAL IMPRESSION: Patient presents to PT with continued knee pain and reports tightness in her lower back. Session today focused on core, proximal hip and LE strengthening. Introduced balance training this session with one attempt at romberg stance with eyes closed with immediate LOB requiring therapist assist to recover. She was able to tolerate more repetitions of exercises this session. Patient continues to benefit from skilled PT services and should be progressed as able to improve functional independence.     OBJECTIVE IMPAIRMENTS decreased activity tolerance, decreased balance, decreased mobility, difficulty walking, decreased ROM, decreased strength, improper body mechanics, postural dysfunction, obesity, and pain.    ACTIVITY LIMITATIONS carrying, bending, sitting, standing, squatting, sleeping, stairs, transfers, dressing, and locomotion level   PARTICIPATION LIMITATIONS: driving, shopping, and exercising and    PERSONAL FACTORS L TKA 01-26-21, DDD, cardiac cath 2009, Sciatica, RTC syndrome, plantar fasciitis, carpal tunnel, OA, obesity,  See medical hx are also affecting patient's functional outcome.    REHAB POTENTIAL: Fair Pt currently has aide to care for needs at home domestic chores and some cooking   CLINICAL DECISION MAKING: Evolving/moderate complexity   EVALUATION COMPLEXITY: Moderate     GOALS: Goals reviewed with patient? Yes   SHORT TERM GOALS: Target date: 05/23/2022   Pt  independent with initial HEP Baseline:Pt needs to reinforce HEP and consistency of exercise Goal status: INITIAL   2.  Pt will perform 6 minutes walk test.  Baseline: only able to tolerate  2 minute walk test 278 ft (579 normal)  Pt unable to stand for more than 5-10 min due to pain Goal status: INITIAL   3.  Pt will return to aquatic therapy to update HEP for use on her own at local pool Baseline: Pt currently with no routine exercise program due to pain Goal status: INITIAL   4.  Pt will improve SLS to at least 15 sec on R and L to show improved balance Baseline: unable to tolerate more than 3 sec SLS Goal status: INITIAL       LONG TERM GOALS: Target date: 06/20/2022   Pt will be independent with advanced HEP on land in order to maintain strength Baseline: Pt currently not doing any routine exercise Goal status: INITIAL   2.  Return to walking/standing for without stopping using LRAD in order to shop Baseline: Currently not doing any exercise and cannot stand or walk greater than 5-10 minutes and must use riding grocery cart Goal status: INITIAL   3.  Pt will use pain management in order to sleep for 4 or more hour of uninterrupted sleep Baseline: pt awakens every 2 hours due to pain and wakes whenever she turns in bed Goal status: INITIAL   4.  Pt will be able to negotiate steps without exacerbation of pain greater than 3/10 Baseline: 7/10 currently in eval but can rise to 10/10 Goal status: INITIAL   5.  BERG balance with increase at least 10 points to 45/56 to show decreased risk of fall with LRAD Baseline: eval 35/56 using cane but told to utlize walker at all times Goal status: INITIAL   6.  ODI will improve  to at least 30/50 to show improvement in functional mobility Baseline: 40/50 80% disabilty Goal status: INITIAL   7. Pt will demonstrate floor to stand transfer safely for falls preparedness and decrease risk of fall            Baseline: pt unable to transfer floor to stand and has had 3 falls in last 6 months.            Goal status INITIAL   8. Pt will incorporate regular land based exercise in order to maintain strength for  ADL/household tasks            Baseline:  Pt unable to do any regular exercise due to pain and weakness.            Goal status INITIAL     PLAN: PT FREQUENCY: 2x/week   PT DURATION: 8 weeks   PLANNED INTERVENTIONS: Therapeutic exercises, Therapeutic activity, Neuromuscular re-education, Balance training, Gait training, Patient/Family education, and Joint mobilization.   PLAN FOR NEXT SESSION: progress strength and balance as tolerated. Review HEP.     Harland German, [place] 05/10/22 4:56 PM

## 2022-05-11 DIAGNOSIS — M1712 Unilateral primary osteoarthritis, left knee: Secondary | ICD-10-CM | POA: Diagnosis not present

## 2022-05-12 ENCOUNTER — Ambulatory Visit: Payer: Medicaid Other | Admitting: Physical Therapy

## 2022-05-12 ENCOUNTER — Ambulatory Visit: Payer: Medicaid Other

## 2022-05-12 DIAGNOSIS — M1712 Unilateral primary osteoarthritis, left knee: Secondary | ICD-10-CM | POA: Diagnosis not present

## 2022-05-13 DIAGNOSIS — M1712 Unilateral primary osteoarthritis, left knee: Secondary | ICD-10-CM | POA: Diagnosis not present

## 2022-05-14 DIAGNOSIS — M1712 Unilateral primary osteoarthritis, left knee: Secondary | ICD-10-CM | POA: Diagnosis not present

## 2022-05-15 DIAGNOSIS — M1712 Unilateral primary osteoarthritis, left knee: Secondary | ICD-10-CM | POA: Diagnosis not present

## 2022-05-16 ENCOUNTER — Ambulatory Visit: Payer: Medicaid Other | Admitting: Physical Therapy

## 2022-05-16 DIAGNOSIS — M6281 Muscle weakness (generalized): Secondary | ICD-10-CM

## 2022-05-16 DIAGNOSIS — M25562 Pain in left knee: Secondary | ICD-10-CM | POA: Diagnosis not present

## 2022-05-16 DIAGNOSIS — R262 Difficulty in walking, not elsewhere classified: Secondary | ICD-10-CM | POA: Diagnosis not present

## 2022-05-16 DIAGNOSIS — G8929 Other chronic pain: Secondary | ICD-10-CM | POA: Diagnosis not present

## 2022-05-16 DIAGNOSIS — M1712 Unilateral primary osteoarthritis, left knee: Secondary | ICD-10-CM | POA: Diagnosis not present

## 2022-05-16 DIAGNOSIS — R252 Cramp and spasm: Secondary | ICD-10-CM | POA: Diagnosis not present

## 2022-05-16 DIAGNOSIS — R296 Repeated falls: Secondary | ICD-10-CM

## 2022-05-16 DIAGNOSIS — M25662 Stiffness of left knee, not elsewhere classified: Secondary | ICD-10-CM | POA: Diagnosis not present

## 2022-05-16 DIAGNOSIS — R2681 Unsteadiness on feet: Secondary | ICD-10-CM

## 2022-05-16 DIAGNOSIS — M542 Cervicalgia: Secondary | ICD-10-CM | POA: Diagnosis not present

## 2022-05-16 DIAGNOSIS — M5440 Lumbago with sciatica, unspecified side: Secondary | ICD-10-CM | POA: Diagnosis not present

## 2022-05-16 NOTE — Therapy (Signed)
OUTPATIENT PHYSICAL THERAPY TREATMENT NOTE   Patient Name: Katie [name] MRN: [id number] DOB:[birth year], 56 y.o., female Today's Date: 05/16/2022  PCP: Rometta Emery, MD REFERRING PROVIDER: Rometta Emery, MD  END OF SESSION:   PT End of Session - 05/16/22 1154     Visit Number 4    Number of Visits 16    Date for PT Re-Evaluation 06/20/22    Authorization Type [employer] NO tx, estim unattended , [facility]    PT Start Time 1147    PT Stop Time 1230    PT Time Calculation (min) 43 min    Activity Tolerance Patient tolerated treatment well    Behavior During Therapy [facility] for tasks assessed/performed               Past Medical History:  Diagnosis Date   Anxiety    Back pain    Bipolar 1 disorder (HCC)    Chronic pain entered 08/15/2015   Treated with Oxycodone   Depression    Depression    Dyspnea    History of acute bronchitis    HTN (hypertension)    OA (osteoarthritis)    Obesity    PE (pulmonary embolism)    oct 2014   Sciatica    Past Surgical History:  Procedure Laterality Date   ADENOIDECTOMY     BIOPSY STOMACH     CARDIAC CATHETERIZATION  2009   normal; in [place]   CESAREAN SECTION  2002   x 2, 1997 and 2002   CHOLECYSTECTOMY  2007   KNEE SURGERY Right 2005   TONSILLECTOMY     TOTAL KNEE ARTHROPLASTY Left 01/27/2020   Procedure: TOTAL KNEE ARTHROPLASTY;  Surgeon: [name], MD;  Location: [facility];  Service: Orthopedics;  Laterality: Left;   TYMPANOSTOMY TUBE PLACEMENT     Patient Active Problem List   Diagnosis Date Noted   Allergic reaction caused by a drug 07/26/2021   Primary osteoarthritis of left knee 12/29/2019   Morbid obesity (HCC)    Bradycardia    Pulmonary embolism on long-term anticoagulation therapy (HCC) 04/06/2016   Pain in the chest    Chronic pain    Chest wall pain    Pulmonary embolism (HCC) 07/31/2013   Vocal cord dysfunction 02/28/2012   Dyspnea 01/17/2012   Chest pain 11/25/2011    Hypokalemia 11/25/2011   GERD (gastroesophageal reflux disease) 03/08/2011   DYSMENORRHEA 11/10/2010   PLANTAR FASCIITIS 09/14/2010   OSTEOARTHRITIS, KNEES, BILATERAL, 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 01/03/2007   Irritable bowel syndrome 01/03/2007    REFERRING DIAG: Lumbar radicular pain & spondylosis  THERAPY DIAG:  Chronic right-sided low back pain with sciatica, sciatica laterality unspecified  Unsteadiness on feet  Muscle weakness (generalized)  Repeated falls  Rationale for Evaluation and Treatment Rehabilitation  PERTINENT HISTORY: L TKA 01-26-21, DDD, cardiac cath 2009, Sciatica, RTC syndrome, plantar fasciitis, carpal tunnel, OA, obesisty,  Se medical hx  PRECAUTIONS: Other: ALERGIC to percocet, hydrocodone, Vicodin, Aleve  SUBJECTIVE: I had to take a quick trip to [place] because I have a dtr pregnant for emergency because she is homeless and needed her to come home with me here in [place].  I got back into town about 4:00 am today.  PAIN:  Are you having pain? Yes: NPRS scale: 8/10 back, Left knee 8/10 and R 5/10  but left foot has heel spur and it hurts Pain location: left knee Pain description: swollen Aggravating factors: movement Relieving factors: rest   OBJECTIVE: (objective measures completed at initial evaluation unless otherwise dated)   OBJECTIVE:    DIAGNOSTIC FINDINGS:  No recent Lumbar images IMPRESSION: 01-20-22 Cervical Minor degenerative changes as detailed above without significant stenosis.  COMPARISON:  June 2022   FINDINGS: Alignment: No significant listhesis.   Vertebrae: Cervical vertebral body heights are maintained. No marrow edema. No  suspicious osseous lesion.   Cord: No abnormal signal.   Posterior Fossa, vertebral arteries, paraspinal tissues: Unremarkable.   PATIENT SURVEYS:  Modified Oswestry 40/50   80% disability    SCREENING FOR RED FLAGS: Bowel or bladder incontinence: No Cauda equina syndrome: No   COGNITION:           Overall cognitive status: Within functional limits for tasks assessed                          SENSATION: Pt complains of numbness into entire hand of R hand    MUSCLE LENGTH: Hamstrings:  bil tight ness approximaltely 20 degrees     POSTURE: rounded shoulders, forward head, and Pt has had weight loss of 90 lbs in last 4 years   PALPATION:  Lumbar paraspinal tightness.  R elevated pelvis. TTP over R gluteals and R QL Grip strength R average 40 lb and L average 59 lb  R hand dominant LUMBAR ROM:    Active  A/PROM  eval  Flexion Fingers to ankles but pain on rising upright  Extension 15  with pain on R   Right lateral flexion Fingertip to 1 inch above knee jt    Left lateral flexion Finger tip to 2 in above knee jt  Right rotation 50% pain to pulling from shld ot back  Left rotation 50%   (Blank rows = not tested)   LOWER EXTREMITY ROM:      Active  Right eval Left eval  Hip flexion 60 80  Hip extension      Hip abduction      Hip adduction      Hip internal rotation      Hip external rotation      Knee flexion 120 118  Knee extension -5 -5  Ankle dorsiflexion      Ankle plantarflexion      Ankle inversion      Ankle eversion       (Blank rows = not tested)   LOWER EXTREMITY MMT:   Pain with R LE movment    MMT Right eval Left eval  Hip flexion 3- 4-  Hip extension 3- 4-  Hip abduction 3- 3+  Hip adduction      Hip internal rotation      Hip external rotation      Knee flexion 4- 4+  Knee extension 4- 4  Ankle dorsiflexion 3+ 4  Ankle plantarflexion 3/25 3/25  Ankle inversion      Ankle eversion       (Blank rows = not tested)   LUMBAR SPECIAL  TESTS:  Straight leg raise test: Positive, Slump test: Negative, and FABER test: Positive Right side pain with movement    FUNCTIONAL TESTS:  5 times sit to stand: 41.08 sec  2 minute walk test: 278 ft Berg Balance Scale: 35/56  must use walker at all times for safety   05-16-22 6MWT  927 feet ( normal 1635-1980 ft)   GAIT: Distance walked: 280 feet Assistive device utilized: Single point cane Level of assistance: SBA Comments: 2 MWT 268ft. Normal for age 14 feet Pt with R antalgic gait with lateral trunk lean to left.  Decreased trunk rotation  Decreased stance time on R    2.13 ft /sec gait velocity    BERG BALANCE   Sitting to Standing: Numbers; 0-4: 2            4. Stands without using hands and stabilize independently            3. Stands independently using hands            2. Stands using hands after multiple trials            1. Min A to stand            0. Mod-Max A to stand Standing unsupported: Numbers; 0-4: 4            4. Stands safely for 2 minutes            3. Stands 2 minutes with supervision            2. Stands 30 seconds unsupported            1. Needs several tries to stand unsupported for 30 seconds            0. Unable to stand unsupported for 30 seconds Sitting unsupported: Numbers; 0-4: 4            4. Sits for 2 minutes independently            3. Sits for 2 minutes with supervision            2. Able to sit 30 seconds            1. Able to sit 10 seconds            0. Unable to sit for 10 seconds Standing to Sitting: Numbers; 0-4: 3 4. Sits safely with minimal use of hands 3. Controls descent with hands 2. Uses back of legs against chair to control descent 1. Sits independently, but uncontrolled descent 0. Needs assistance Transfers: Numbers; 0-4: 3            4. Transfers safely with minor use of hands            3. Transfers safely definite use of hands            2. Transfers with verbal cueing/supervision            1. Needs 1 person  assist            0. Needs 2 person assist  Standing with eyes closed: Numbers; 0-4: 3            4. Stands safely for 10 seconds            3. Stands 10 seconds with supervision             2. Able to stand for 3 seconds            1. Unable to keep eyes closed for 3 seconds, but is safe            0. Needs assist to keep from falling  Standing with feet together: Numbers; 0-4: 2 4. Stands for 1 minute safely 3. Stands for 1 minute with supervision 2. Unable to hold for 30 seconds            1. Needs help to attain position but can hold for 15 seconds            0. Needs help to attain position and unable to hold for 15 seconds Reaching forward with outstretched arm: Numbers; 0-4: 3            4. Reaches forward 10 inches            3. Reaches forward 5 inches            2. Reaches forward 2 inches            1. Reaches forward with supervision            0. Loses balance/requires assistace Retrieving object from the floor: Numbers; 0-4: 2 4. Able to pick up easily and safely 3. Able to pick up with supervision 2. Unable to pick up, but reaches within 1-2 inches independently 1. Unable to pick up and needs supervision 0. Unable/needs assistance to keep from falling  Turning to look behind: Numbers; 0-4: 2            4. Looks behind from both sides and weight shifts well            3. Looks behind one side only, other side less weight shift            2. Turns sideways only, maintains balance            1. Needs supervision when turning            0. Needs assistance  Turning 360 degrees: Numbers; 0-4: 2            4. Able to turn in </=4 seconds            3. Able to turn on one side in </= 4 seconds             2. Able to turn slowly, but safely            1. Needs supervision or verbal cueing            0. Needs assistance Place alternate foot on stool: Numbers; 0-4: 2 4. Completes 8 steps in 20 seconds 3. Completes 8 steps in >20 seconds 2. 4 steps without  assistance/supervision 1. Completes >2 steps with minimal assist 0. Unable, needs assist to keep from falling Standing with one foot in front: Numbers; 0-4: 2            4. Independent tandem for 30 seconds            3. Independent foot ahead for 30 seconds            2. Independent small step for 30 seconds            1. Needs help to step, but can hold for 15 seconds            0. Loses balance while standing/stepping Standing on one foot: Numbers; 0-4: 1 4. Holds >10 seconds 3. Holds 5-10 seconds 2. Holds >/=3 seconds  1. Holds <3 seconds 0. Unable    Total Score: 35/56  Pt recommended to use walker at all times for now due to BERG test   TODAY'S TREATMENT  [facility] Adult PT Treatment:                                                DATE: 05-16-22  05-16-22 6MWT  927 feet ( normal 1635-1980 ft) Therapeutic Exercise: Nustep level 5 x 5 mins Sink squats 3 x 10 with UE support then  did 6 MWT Childs pose forward and side to side Supine posterior pelvic tilt 5" hold 2 x 10 VC to bend knees Bridges x10  SLR partial range 2 x 10 bilateral  Sidelying clamshell 1 x 10 bilateral green band  LAQ 2 x 10 bilateral  with GTB for resistance Standing march 2 x 10 Standing hip abduction 2 x 10 bilateral   Neuromuscular re-ed: Romberg stance 2x30" 1 set with head turns SLS on R and L practicing for 2 minutes no more than 10 sec at longest Semi-tandem stance x30" BIL   [facility] Adult PT Treatment:                                                DATE: 05/10/2022 Therapeutic Exercise: Nustep level 5 x 5 mins Supine posterior pelvic tilt 5" hold 2 x 10 Bridges x10 (hamstring cramp) SLR partial range 2 x 10 bilateral  Sidelying clamshell 1 x 10 bilateral green band  Sit to stand arms crossed 1 x 10  LAQ 2 x 10 bilateral  Standing march 2 x 10 Standing hip abduction 2 x 10 bilateral  Neuromuscular re-ed: Romberg stance 2x30" 1 set with head turns Semi-tandem stance x30" BIL   [facility] Adult PT  Treatment:                                                DATE: 05/03/22 Therapeutic Exercise: Supine posterior pelvic tilt 1 x 10 SLR partial range 1 x 10 bilateral  Clamshell 1 x 10 bilateral green band  Sit to stand raised height 1 x 10  LAQ 1 x 10 bilateral  Standing march 1 x 10 Standing hip abduction 1 x 10 bilateral      PATIENT EDUCATION:  Education details: HEP Person educated: Patient Education method: Musician, cues, and Handouts Education comprehension: verbalized understanding,cues, returned demo, further instruction required      HOME EXERCISE PROGRAM:   Access Code: [email or web address] URL: [email or web address] Date: 05/03/2022 Prepared by: Gwendolyn Grant  Exercises - Supine Posterior Pelvic Tilt  - 1 x daily - 7 x weekly - 2 sets - 10 reps - Small Range Straight Leg Raise  - 1 x daily - 7 x weekly - 2 sets - 10 reps - Clamshell with Resistance  - 1 x daily - 7 x weekly - 2 sets - 10 reps - Sit to Stand  - 1 x daily - 7 x weekly - 2 sets - 10 reps - Seated Long Arc Quad  - 1 x daily - 7 x weekly - 2 sets - 10 reps - Standing Marching  - 1 x daily - 7 x weekly - 2 sets - 10 reps - Standing Hip Abduction with Counter Support  - 1  x daily - 7 x weekly - 2 sets - 10 reps  ASSESSMENT:   CLINICAL IMPRESSION: Patient presents  to clinic utilizing [facility] that needed adjustment and  CC of back pain and  L foot pain ( bone spur post injection) especially after traveling to [facility] to retrieve pregnant homeless daughter and her back is in spasm. She returned at 4:00 AM this morning. She has not had time to  exercise but knows she needs to prioritize her own health as she will soon be caring for dtr and new grandbaby due date July 19. Session today focused  endurance with 6 MWT and on core, proximal hip and LE strengthening with balance.    Patient continues to benefit from skilled PT services and should be progressed as able to improve functional independence.      OBJECTIVE IMPAIRMENTS decreased activity tolerance, decreased balance, decreased mobility, difficulty walking, decreased ROM, decreased strength, improper body mechanics, postural dysfunction, obesity, and pain.    ACTIVITY LIMITATIONS carrying, bending, sitting, standing, squatting, sleeping, stairs, transfers, dressing, and locomotion level   PARTICIPATION LIMITATIONS: driving, shopping, and exercising and    PERSONAL FACTORS L TKA 01-26-21, DDD, cardiac cath 2009, Sciatica, RTC syndrome, plantar fasciitis, carpal tunnel, OA, obesity,  See medical hx are also affecting patient's functional outcome.    REHAB POTENTIAL: Fair Pt currently has aide to care for needs at home domestic chores and some cooking   CLINICAL DECISION MAKING: Evolving/moderate complexity   EVALUATION COMPLEXITY: Moderate     GOALS: Goals reviewed with patient? Yes   SHORT TERM GOALS: Target date: 05/23/2022   Pt independent with initial HEP Baseline:Pt needs to reinforce HEP and consistency of exercise Goal status: MET   2.  Pt will perform 6 minutes walk test.  Baseline: only able to tolerate 2 minute walk test 278 ft (579 normal)  Pt unable to stand for more than 5-10 min due to pain  05-16-22 6MWT  927 feet ( normal 1635-1980 ft) Goal status: I ONGOING   3.  Pt will return to aquatic therapy to update HEP for use on her own at local pool Baseline: Pt currently with no routine exercise program due to pain, Pt has not been to aquatics yet 05-16-22 Goal status: ONGOING   4.  Pt will improve SLS to at least 15 sec on R and L to show improved balance Baseline: unable to tolerate more than 3 sec SLS Goal status: INITIAL       LONG TERM GOALS: Target date: 06/20/2022   Pt will be independent with advanced HEP on land in order to maintain strength Baseline: Pt currently not doing any routine exercise Goal status: INITIAL   2.  Return to walking/standing for 2minutes without stopping using LRAD in order to  shop Baseline: Currently not doing any exercise and cannot stand or walk greater than 5-10 minutes and must use riding grocery cart Goal status: INITIAL   3.  Pt will use pain management in order to sleep for 4 or more hour of uninterrupted sleep Baseline: pt awakens every 2 hours due to pain and wakes whenever she turns in bed Goal status: INITIAL   4.  Pt will be able to negotiate steps without exacerbation of pain greater than 3/10 Baseline: 7/10 currently in eval but can rise to 10/10 Goal status: INITIAL   5.  BERG balance with increase at least 10 points to 45/56 to show decreased risk of fall with LRAD Baseline: eval 35/56 using cane  but told to utlize walker at all times Goal status: INITIAL   6.  ODI will improve  to at least 30/50 to show improvement in functional mobility Baseline: 40/50 80% disabilty Goal status: INITIAL   7. Pt will demonstrate floor to stand transfer safely for falls preparedness and decrease risk of fall            Baseline: pt unable to transfer floor to stand and has had 3 falls in last 6 months.            Goal status INITIAL   8. Pt will incorporate regular land based exercise in order to maintain strength for ADL/household tasks            Baseline:  Pt unable to do any regular exercise due to pain and weakness.            Goal status INITIAL     PLAN: PT FREQUENCY: 2x/week   PT DURATION: 8 weeks   PLANNED INTERVENTIONS: Therapeutic exercises, Therapeutic activity, Neuromuscular re-education, Balance training, Gait training, Patient/Family education, and Joint mobilization.   PLAN FOR NEXT SESSION: progress strength and balance as tolerated. Review HEP.   Add resisted knee flex and ext.   Voncille Lo, PT, [place] Certified Exercise Expert for the Aging Adult  05/16/22 12:38 PM Phone: [phone] Fax: [phone]

## 2022-05-17 ENCOUNTER — Ambulatory Visit: Payer: Medicaid Other | Admitting: Physical Therapy

## 2022-05-17 DIAGNOSIS — M1712 Unilateral primary osteoarthritis, left knee: Secondary | ICD-10-CM | POA: Diagnosis not present

## 2022-05-18 DIAGNOSIS — M1712 Unilateral primary osteoarthritis, left knee: Secondary | ICD-10-CM | POA: Diagnosis not present

## 2022-05-19 DIAGNOSIS — M1712 Unilateral primary osteoarthritis, left knee: Secondary | ICD-10-CM | POA: Diagnosis not present

## 2022-05-20 DIAGNOSIS — M1712 Unilateral primary osteoarthritis, left knee: Secondary | ICD-10-CM | POA: Diagnosis not present

## 2022-05-21 DIAGNOSIS — M1712 Unilateral primary osteoarthritis, left knee: Secondary | ICD-10-CM | POA: Diagnosis not present

## 2022-05-22 DIAGNOSIS — M1712 Unilateral primary osteoarthritis, left knee: Secondary | ICD-10-CM | POA: Diagnosis not present

## 2022-05-22 NOTE — Therapy (Signed)
OUTPATIENT PHYSICAL THERAPY TREATMENT NOTE   Patient Name: Katie [name] MRN: [id number] DOB:05/02/1966, 56 y.o., female Today's Date: 05/23/2022  PCP: Elwyn Reach, MD REFERRING PROVIDER: Elwyn Reach, MD  END OF SESSION:   PT End of Session - 05/23/22 1344     Visit Number 5    Number of Visits 16    Date for PT Re-Evaluation 06/20/22    Authorization Type [employer] NO tx, estim unattended , [facility]    PT Start Time 1338    PT Stop Time 1418    PT Time Calculation (min) 40 min                Past Medical History:  Diagnosis Date   Anxiety    Back pain    Bipolar 1 disorder ([place])    Chronic pain entered 08/15/2015   Treated with Oxycodone   Depression    Depression    Dyspnea    History of acute bronchitis    HTN (hypertension)    OA (osteoarthritis)    Obesity    PE (pulmonary embolism)    oct 2014   Sciatica    Past Surgical History:  Procedure Laterality Date   ADENOIDECTOMY     BIOPSY STOMACH     CARDIAC CATHETERIZATION  2009   normal; in [place]  2002   x 2, 1997 and 2002   CHOLECYSTECTOMY  2007   KNEE SURGERY Right 2005   TONSILLECTOMY     TOTAL KNEE ARTHROPLASTY Left 01/27/2020   Procedure: TOTAL KNEE ARTHROPLASTY;  Surgeon: Renette Butters, MD;  Location: [facility];  Service: Orthopedics;  Laterality: Left;   TYMPANOSTOMY TUBE PLACEMENT     Patient Active Problem List   Diagnosis Date Noted   Allergic reaction caused by a drug 07/26/2021   Primary osteoarthritis of left knee 12/29/2019   Morbid obesity ([place])    Bradycardia    Pulmonary embolism on long-term anticoagulation therapy ([place]) 04/06/2016   Pain in the chest    Chronic pain    Chest wall pain    Pulmonary embolism ([place]) 07/31/2013   Vocal cord dysfunction 02/28/2012   Dyspnea 01/17/2012   Chest pain 11/25/2011   Hypokalemia 11/25/2011   GERD (gastroesophageal reflux disease) 03/08/2011   DYSMENORRHEA 11/10/2010   PLANTAR  FASCIITIS 09/14/2010   OSTEOARTHRITIS, KNEES, BILATERAL, SEVERE 06/22/2010   ROTATOR CUFF SYNDROME, RIGHT 04/11/2010   VITAMIN D DEFICIENCY 02/18/2010   Bipolar 1 disorder ([place]) 01/17/2010   Essential hypertension, benign 01/17/2010   INSOMNIA 01/17/2010   OBESITY, NOS 01/03/2007   Depression 01/03/2007   HEARING LOSS NOS OR DEAFNESS 01/03/2007   EDEMA-LEGS,DUE TO VENOUS OBSTRUCT. 01/03/2007   Irritable bowel syndrome 01/03/2007    REFERRING DIAG: Lumbar radicular pain & spondylosis  THERAPY DIAG:  Chronic right-sided low back pain with sciatica, sciatica laterality unspecified  Unsteadiness on feet  Muscle weakness (generalized)  Repeated falls  Cervicalgia  Cramp and spasm  Acute pain of left knee  Stiffness of left knee, not elsewhere classified  Difficulty in walking, not elsewhere classified  Rationale for Evaluation and Treatment Rehabilitation  PERTINENT HISTORY: L TKA 01-26-21, DDD, cardiac cath 2009, Sciatica, RTC syndrome, plantar fasciitis, carpal tunnel, OA, obesisty,  Se medical hx  PRECAUTIONS: Other: ALERGIC to percocet, hydrocodone, Vicodin, Aleve  SUBJECTIVE: My daughter had her baby girl on Saturday.  I have been busy. She came home yesterday and both are doing well.  My  anxiety is up right now.  I have been approved for disability.   PAIN:  Are you having pain? Yes: NPRS scale: 4/10 back, Left knee 6/10 and R 5/10  but left foot has heel spur and it hurts Pain location: left knee Pain description: swollen Aggravating factors: movement Relieving factors: rest   OBJECTIVE: (objective measures completed at initial evaluation unless otherwise dated)   OBJECTIVE:    DIAGNOSTIC FINDINGS:  No recent Lumbar images IMPRESSION: 01-20-22 Cervical Minor degenerative changes as detailed above without significant stenosis.  COMPARISON:  June 2022   FINDINGS: Alignment: No significant listhesis.   Vertebrae: Cervical vertebral body heights are  maintained. No marrow edema. No suspicious osseous lesion.   Cord: No abnormal signal.   Posterior Fossa, vertebral arteries, paraspinal tissues: Unremarkable.   PATIENT SURVEYS:  Modified Oswestry 40/50   80% disability    SCREENING FOR RED FLAGS: Bowel or bladder incontinence: No Cauda equina syndrome: No   COGNITION:           Overall cognitive status: Within functional limits for tasks assessed                          SENSATION: Pt complains of numbness into entire hand of R hand    MUSCLE LENGTH: Hamstrings:  bil tight ness approximaltely 20 degrees     POSTURE: rounded shoulders, forward head, and Pt has had weight loss of 90 lbs in last 4 years   PALPATION:  Lumbar paraspinal tightness.  R elevated pelvis. TTP over R gluteals and R QL Grip strength R average 40 lb and L average 59 lb  R hand dominant LUMBAR ROM:    Active  A/PROM  eval  Flexion Fingers to ankles but pain on rising upright  Extension 15  with pain on R   Right lateral flexion Fingertip to 1 inch above knee jt    Left lateral flexion Finger tip to 2 in above knee jt  Right rotation 50% pain to pulling from shld ot back  Left rotation 50%   (Blank rows = not tested)   LOWER EXTREMITY ROM:      Active  Right eval Left eval  Hip flexion 60 80  Hip extension      Hip abduction      Hip adduction      Hip internal rotation      Hip external rotation      Knee flexion 120 118  Knee extension -5 -5  Ankle dorsiflexion      Ankle plantarflexion      Ankle inversion      Ankle eversion       (Blank rows = not tested)   LOWER EXTREMITY MMT:   Pain with R LE movment    MMT Right eval Left eval  Hip flexion 3- 4-  Hip extension 3- 4-  Hip abduction 3- 3+  Hip adduction      Hip internal rotation      Hip external rotation      Knee flexion 4- 4+  Knee extension 4- 4  Ankle dorsiflexion 3+ 4  Ankle plantarflexion 3/25 3/25  Ankle inversion      Ankle eversion       (Blank rows =  not tested)   LUMBAR SPECIAL TESTS:  Straight leg raise test: Positive, Slump test: Negative, and FABER test: Positive Right side pain with movement    FUNCTIONAL TESTS:  5 times sit  to stand: 41.08 sec 05-23-22 5 x STS  20.06 sec 2 minute walk test: 278 ft Berg Balance Scale: 35/56  must use walker at all times for safety   05-16-22 6MWT  927 feet ( normal 1635-1980 ft)   GAIT: Distance walked: 280 feet Assistive device utilized: Single point cane Level of assistance: SBA Comments: 2 MWT 235f. Normal for age 2255feet Pt with R antalgic gait with lateral trunk lean to left.  Decreased trunk rotation  Decreased stance time on R    2.13 ft /sec gait velocity    BERG BALANCE   Sitting to Standing: Numbers; 0-4: 2            4. Stands without using hands and stabilize independently            3. Stands independently using hands            2. Stands using hands after multiple trials            1. Min A to stand            0. Mod-Max A to stand Standing unsupported: Numbers; 0-4: 4            4. Stands safely for 2 minutes            3. Stands 2 minutes with supervision            2. Stands 30 seconds unsupported            1. Needs several tries to stand unsupported for 30 seconds            0. Unable to stand unsupported for 30 seconds Sitting unsupported: Numbers; 0-4: 4            4. Sits for 2 minutes independently            3. Sits for 2 minutes with supervision            2. Able to sit 30 seconds            1. Able to sit 10 seconds            0. Unable to sit for 10 seconds Standing to Sitting: Numbers; 0-4: 3 4. Sits safely with minimal use of hands 3. Controls descent with hands 2. Uses back of legs against chair to control descent 1. Sits independently, but uncontrolled descent 0. Needs assistance Transfers: Numbers; 0-4: 3            4. Transfers safely with minor use of hands            3. Transfers safely definite use of hands            2. Transfers with  verbal cueing/supervision            1. Needs 1 person assist            0. Needs 2 person assist  Standing with eyes closed: Numbers; 0-4: 3            4. Stands safely for 10 seconds            3. Stands 10 seconds with supervision             2. Able to stand for 3 seconds            1. Unable to keep eyes closed for 3 seconds, but is safe  0. Needs assist to keep from falling Standing with feet together: Numbers; 0-4: 2 4. Stands for 1 minute safely 3. Stands for 1 minute with supervision 2. Unable to hold for 30 seconds            1. Needs help to attain position but can hold for 15 seconds            0. Needs help to attain position and unable to hold for 15 seconds Reaching forward with outstretched arm: Numbers; 0-4: 3            4. Reaches forward 10 inches            3. Reaches forward 5 inches            2. Reaches forward 2 inches            1. Reaches forward with supervision            0. Loses balance/requires assistace Retrieving object from the floor: Numbers; 0-4: 2 4. Able to pick up easily and safely 3. Able to pick up with supervision 2. Unable to pick up, but reaches within 1-2 inches independently 1. Unable to pick up and needs supervision 0. Unable/needs assistance to keep from falling  Turning to look behind: Numbers; 0-4: 2            4. Looks behind from both sides and weight shifts well            3. Looks behind one side only, other side less weight shift            2. Turns sideways only, maintains balance            1. Needs supervision when turning            0. Needs assistance  Turning 360 degrees: Numbers; 0-4: 2            4. Able to turn in </=4 seconds            3. Able to turn on one side in </= 4 seconds             2. Able to turn slowly, but safely            1. Needs supervision or verbal cueing            0. Needs assistance Place alternate foot on stool: Numbers; 0-4: 2 4. Completes 8 steps in 20 seconds 3. Completes 8  steps in >20 seconds 2. 4 steps without assistance/supervision 1. Completes >2 steps with minimal assist 0. Unable, needs assist to keep from falling Standing with one foot in front: Numbers; 0-4: 2            4. Independent tandem for 30 seconds            3. Independent foot ahead for 30 seconds            2. Independent small step for 30 seconds            1. Needs help to step, but can hold for 15 seconds            0. Loses balance while standing/stepping Standing on one foot: Numbers; 0-4: 1 4. Holds >10 seconds 3. Holds 5-10 seconds 2. Holds >/=3 seconds  1. Holds <3 seconds 0. Unable    Total Score: 35/56  Pt recommended to use walker at all times for now due to  BERG test   TODAY'S TREATMENT  [facility] Adult PT Treatment:                                                DATE: 05-23-22 05-23-22 5 x STS  20.06 sec Therapeutic Exercise: Recumbent Bike 5 min level 4 RPE 5/6 March with RTB resistance 2 x 10 on R and L SLS with hip extension with KB touch on R and L 2 x 10 with UE support Squats from chair with added there ex cushion  3 x 5 resp with 25 lb KB Ham curls with RTB 1 x 10 LAQ 2 x 10 bilateral  with GTB for resistance Standing hip extension 2 x 10 R and L each with RTB Standing hip abduction 2 x 10  R and L each with RTB  Neuromuscular re-ed: Single limb KB touch with L UE on counter. 2 x 10 Single limb KB touch with  R UE on counter. 2 x 10 Toe touch SLS on R and then left 1 min each with no UE support SLS on there ex pad R and then left 1 min each several attempts Semi-tandem stance on there ex pad x30" BIL    [facility] Adult PT Treatment:                                                DATE: 05-16-22  05-16-22 6MWT  927 feet ( normal 1635-1980 ft) Therapeutic Exercise: Nustep level 5 x 5 mins Sink squats 3 x 10 with UE support then  did 6 MWT Childs pose forward and side to side Supine posterior pelvic tilt 5" hold 2 x 10 VC to bend knees Bridges x10  SLR partial  range 2 x 10 bilateral  Sidelying clamshell 1 x 10 bilateral green band  LAQ 2 x 10 bilateral  with GTB for resistance Standing march 2 x 10 Standing hip abduction 2 x 10 bilateral   Neuromuscular re-ed: Romberg stance 2x30" 1 set with head turns SLS on R and L practicing for 2 minutes no more than 10 sec at longest Semi-tandem stance x30" BIL   [facility] Adult PT Treatment:                                                DATE: 05/10/2022 Therapeutic Exercise: Nustep level 5 x 5 mins Supine posterior pelvic tilt 5" hold 2 x 10 Bridges x10 (hamstring cramp) SLR partial range 2 x 10 bilateral  Sidelying clamshell 1 x 10 bilateral green band  Sit to stand arms crossed 1 x 10  LAQ 2 x 10 bilateral  Standing march 2 x 10 Standing hip abduction 2 x 10 bilateral  Neuromuscular re-ed: Romberg stance 2x30" 1 set with head turns Semi-tandem stance x30" BIL   [facility] Adult PT Treatment:                                                DATE:  05/03/22 Therapeutic Exercise: Supine posterior pelvic tilt 1 x 10 SLR partial range 1 x 10 bilateral  Clamshell 1 x 10 bilateral green band  Sit to stand raised height 1 x 10  LAQ 1 x 10 bilateral  Standing march 1 x 10 Standing hip abduction 1 x 10 bilateral      PATIENT EDUCATION:  Education details: HEP Person educated: Patient Education method: Musician, cues, and Handouts Education comprehension: verbalized understanding,cues, returned demo, further instruction required      HOME EXERCISE PROGRAM:   Access Code: [email or web address] URL: [email or web address] Date: 05/03/2022 Prepared by: Gwendolyn Grant  Exercises - Supine Posterior Pelvic Tilt  - 1 x daily - 7 x weekly - 2 sets - 10 reps - Small Range Straight Leg Raise  - 1 x daily - 7 x weekly - 2 sets - 10 reps - Clamshell with Resistance  - 1 x daily - 7 x weekly - 2 sets - 10 reps - Sit to Stand  - 1 x daily - 7 x weekly - 2 sets - 10 reps - Seated Long Arc Quad  - 1 x daily  - 7 x weekly - 2 sets - 10 reps - Standing Marching  - 1 x daily - 7 x weekly - 2 sets - 10 reps - Standing Hip Abduction with Counter Support  - 1 x daily - 7 x weekly - 2 sets - 10 reps  ASSESSMENT:   CLINICAL IMPRESSION:  Pt mistakenly went to aquatics last week.  Pt  will continue with aquatics on 05-31-22 to update aquatic HEP.  Pt today concentrated on endurance with standing exercises and balance neuro reeducation 05-23-22 5 x STS  20.06 sec.  Pt much improved with 5 x STS showing increased LE strength. Pt continues to have balance issues with SL balance. Will continue progressing as able on land and aquatic venues.  Pt states she has been approved for disability. She remains having high level of pain although able to move better. Ms Bonfiglio using a cane at all times and states she avoids stairs and she does not carry her new born grandbaby for safety reasons.  Patient presents  to clinic utilizing [facility] that needed adjustment and  CC of back pain and  L foot pain ( bone spur post injection) especially after traveling to [facility] to retrieve pregnant homeless daughter and her back is in spasm. She returned at 4:00 AM this morning. She has not had time to  exercise but knows she needs to prioritize her own health as she will soon be caring for dtr and new grandbaby due date July 19. Session today focused  endurance with 6 MWT and on core, proximal hip and LE strengthening with balance.    Patient continues to benefit from skilled PT services and should be progressed as able to improve functional independence.     OBJECTIVE IMPAIRMENTS decreased activity tolerance, decreased balance, decreased mobility, difficulty walking, decreased ROM, decreased strength, improper body mechanics, postural dysfunction, obesity, and pain.    ACTIVITY LIMITATIONS carrying, bending, sitting, standing, squatting, sleeping, stairs, transfers, dressing, and locomotion level   PARTICIPATION LIMITATIONS: driving, shopping, and  exercising and    PERSONAL FACTORS L TKA 01-26-21, DDD, cardiac cath 2009, Sciatica, RTC syndrome, plantar fasciitis, carpal tunnel, OA, obesity,  See medical hx are also affecting patient's functional outcome.    REHAB POTENTIAL: Fair Pt currently has aide to care for needs at home domestic chores and some cooking   CLINICAL DECISION  MAKING: Evolving/moderate complexity   EVALUATION COMPLEXITY: Moderate     GOALS: Goals reviewed with patient? Yes   SHORT TERM GOALS: Target date: 05/23/2022   Pt independent with initial HEP Baseline:Pt needs to reinforce HEP and consistency of exercise Goal status: MET   2.  Pt will perform 6 minutes walk test.  Baseline: only able to tolerate 2 minute walk test 278 ft (579 normal)  Pt unable to stand for more than 5-10 min due to pain  05-16-22 6MWT  927 feet ( normal 1635-1980 ft) Goal status: I ONGOING   3.  Pt will return to aquatic therapy to update HEP for use on her own at local pool Baseline: Pt currently with no routine exercise program due to pain, Pt has not been to aquatics yet 05-16-22. 05-23-22  Goal status: ONGOING   4.  Pt will improve SLS to at least 15 sec on R and L to show improved balance Baseline: unable to tolerate more than 3 sec SLS Goal status: ONGOING       LONG TERM GOALS: Target date: 06/20/2022   Pt will be independent with advanced HEP on land in order to maintain strength Baseline: Pt currently not doing any routine exercise Goal status: INITIAL   2.  Return to walking/standing for 72mnutes without stopping using LRAD in order to shop Baseline: Currently not doing any exercise and cannot stand or walk greater than 5-10 minutes and must use riding grocery cart  Goal status: INITIAL   3.  Pt will use pain management in order to sleep for 4 or more hour of uninterrupted sleep Baseline: pt awakens every 2 hours due to pain and wakes whenever she turns in bed 05-23-22 Pt reports 3-4 hours of sleep a night Goal  status: INITIAL   4.  Pt will be able to negotiate steps without exacerbation of pain greater than 3/10 Baseline: 7/10 currently in eval but can rise to 10/10 Goal status: INITIAL   5.  BERG balance with increase at least 10 points to 45/56 to show decreased risk of fall with LRAD Baseline: eval 35/56 using cane but told to utlize walker at all times Goal status: INITIAL   6.  ODI will improve  to at least 30/50 to show improvement in functional mobility Baseline: 40/50 80% disabilty Goal status: INITIAL   7. Pt will demonstrate floor to stand transfer safely for falls preparedness and decrease risk of fall            Baseline: pt unable to transfer floor to stand and has had 3 falls in last 6 months.            Goal status INITIAL   8. Pt will incorporate regular land based exercise in order to maintain strength for ADL/household tasks            Baseline:  Pt unable to do any regular exercise due to pain and weakness.            Goal status INITIAL     PLAN: PT FREQUENCY: 2x/week   PT DURATION: 8 weeks   PLANNED INTERVENTIONS: Therapeutic exercises, Therapeutic activity, Neuromuscular re-education, Balance training, Gait training, Patient/Family education, and Joint mobilization.   PLAN FOR NEXT SESSION: progress strength and balance as tolerated. Review HEP.   Add resisted knee flex and ext.   LVoncille Lo PT, ASinking SpringCertified Exercise Expert for the Aging Adult  05/23/22 2:27 PM Phone: [phone]: [phone]

## 2022-05-23 ENCOUNTER — Encounter: Payer: Self-pay | Admitting: Physical Therapy

## 2022-05-23 ENCOUNTER — Ambulatory Visit: Payer: Medicaid Other | Admitting: Physical Therapy

## 2022-05-23 DIAGNOSIS — M25662 Stiffness of left knee, not elsewhere classified: Secondary | ICD-10-CM | POA: Diagnosis not present

## 2022-05-23 DIAGNOSIS — R296 Repeated falls: Secondary | ICD-10-CM | POA: Diagnosis not present

## 2022-05-23 DIAGNOSIS — G8929 Other chronic pain: Secondary | ICD-10-CM | POA: Diagnosis not present

## 2022-05-23 DIAGNOSIS — M6281 Muscle weakness (generalized): Secondary | ICD-10-CM

## 2022-05-23 DIAGNOSIS — R262 Difficulty in walking, not elsewhere classified: Secondary | ICD-10-CM

## 2022-05-23 DIAGNOSIS — M25562 Pain in left knee: Secondary | ICD-10-CM

## 2022-05-23 DIAGNOSIS — R252 Cramp and spasm: Secondary | ICD-10-CM

## 2022-05-23 DIAGNOSIS — M542 Cervicalgia: Secondary | ICD-10-CM

## 2022-05-23 DIAGNOSIS — M1712 Unilateral primary osteoarthritis, left knee: Secondary | ICD-10-CM | POA: Diagnosis not present

## 2022-05-23 DIAGNOSIS — M5440 Lumbago with sciatica, unspecified side: Secondary | ICD-10-CM | POA: Diagnosis not present

## 2022-05-23 DIAGNOSIS — R2681 Unsteadiness on feet: Secondary | ICD-10-CM | POA: Diagnosis not present

## 2022-05-23 NOTE — Therapy (Signed)
OUTPATIENT PHYSICAL THERAPY TREATMENT NOTE   Patient Name: Katie [name] MRN: [id number] DOB:Apr 02, 1966, 56 y.o., female Today's Date: 05/25/2022  PCP: Elwyn Reach, MD REFERRING PROVIDER: Elwyn Reach, MD  END OF SESSION:   PT End of Session - 05/25/22 1152     Visit Number 6    Number of Visits 16    Date for PT Re-Evaluation 06/20/22    Authorization Type [employer] NO tx, estim unattended , [facility]AS[facility]    PT Start Time 1151    PT Stop Time 1232    PT Time Calculation (min) 41 min    Activity Tolerance Patient tolerated treatment well    Behavior During Therapy [facility] for tasks assessed/performed                 Past Medical History:  Diagnosis Date   Anxiety    Back pain    Bipolar 1 disorder ([place])    Chronic pain entered 08/15/2015   Treated with Oxycodone   Depression    Depression    Dyspnea    History of acute bronchitis    HTN (hypertension)    OA (osteoarthritis)    Obesity    PE (pulmonary embolism)    oct 2014   Sciatica    Past Surgical History:  Procedure Laterality Date   ADENOIDECTOMY     BIOPSY STOMACH     CARDIAC CATHETERIZATION  2009   normal; in [place]  2002   x 2, 1997 and 2002   CHOLECYSTECTOMY  2007   KNEE SURGERY Right 2005   TONSILLECTOMY     TOTAL KNEE ARTHROPLASTY Left 01/27/2020   Procedure: TOTAL KNEE ARTHROPLASTY;  Surgeon: Renette Butters, MD;  Location: [facility];  Service: Orthopedics;  Laterality: Left;   TYMPANOSTOMY TUBE PLACEMENT     Patient Active Problem List   Diagnosis Date Noted   Allergic reaction caused by a drug 07/26/2021   Primary osteoarthritis of left knee 12/29/2019   Morbid obesity ([place])    Bradycardia    Pulmonary embolism on long-term anticoagulation therapy ([place]) 04/06/2016   Pain in the chest    Chronic pain    Chest wall pain    Pulmonary embolism ([place]) 07/31/2013   Vocal cord dysfunction 02/28/2012   Dyspnea 01/17/2012   Chest pain 11/25/2011    Hypokalemia 11/25/2011   GERD (gastroesophageal reflux disease) 03/08/2011   DYSMENORRHEA 11/10/2010   PLANTAR FASCIITIS 09/14/2010   OSTEOARTHRITIS, KNEES, BILATERAL, SEVERE 06/22/2010   ROTATOR CUFF SYNDROME, RIGHT 04/11/2010   VITAMIN D DEFICIENCY 02/18/2010   Bipolar 1 disorder ([place]) 01/17/2010   Essential hypertension, benign 01/17/2010   INSOMNIA 01/17/2010   OBESITY, NOS 01/03/2007   Depression 01/03/2007   HEARING LOSS NOS OR DEAFNESS 01/03/2007   EDEMA-LEGS,DUE TO VENOUS OBSTRUCT. 01/03/2007   Irritable bowel syndrome 01/03/2007    REFERRING DIAG: Lumbar radicular pain & spondylosis  THERAPY DIAG:  Chronic right-sided low back pain with sciatica, sciatica laterality unspecified  Unsteadiness on feet  Muscle weakness (generalized)  Repeated falls  Cervicalgia  Cramp and spasm  Acute pain of left knee  Stiffness of left knee, not elsewhere classified  Difficulty in walking, not elsewhere classified  Rationale for Evaluation and Treatment Rehabilitation  PERTINENT HISTORY: L TKA 01-26-21, DDD, cardiac cath 2009, Sciatica, RTC syndrome, plantar fasciitis, carpal tunnel, OA, obesisty,  Se medical hx  PRECAUTIONS: Other: ALERGIC to percocet, hydrocodone, Vicodin, Aleve  SUBJECTIVE: I am doing a  lot for the baby and I overdid the last two days   So I have 8/10 pain in back today . My heel spur is getting better though  PAIN:  Are you having pain? Yes: NPRS scale: 8/10 back, Left knee 7/10 and R 4/10  but left foot has heel spur and it hurts Pain location: left knee Pain description: swollen Aggravating factors: movement Relieving factors: rest   OBJECTIVE: (objective measures completed at initial evaluation unless otherwise dated)   OBJECTIVE:    DIAGNOSTIC FINDINGS:  No recent Lumbar images IMPRESSION: 01-20-22 Cervical Minor degenerative changes as detailed above without significant stenosis.  COMPARISON:  June 2022   FINDINGS: Alignment: No  significant listhesis.   Vertebrae: Cervical vertebral body heights are maintained. No marrow edema. No suspicious osseous lesion.   Cord: No abnormal signal.   Posterior Fossa, vertebral arteries, paraspinal tissues: Unremarkable.   PATIENT SURVEYS:  Modified Oswestry 40/50   80% disability    SCREENING FOR RED FLAGS: Bowel or bladder incontinence: No Cauda equina syndrome: No   COGNITION:           Overall cognitive status: Within functional limits for tasks assessed                          SENSATION: Pt complains of numbness into entire hand of R hand    MUSCLE LENGTH: Hamstrings:  bil tight ness approximaltely 20 degrees     POSTURE: rounded shoulders, forward head, and Pt has had weight loss of 90 lbs in last 4 years   PALPATION:  Lumbar paraspinal tightness.  R elevated pelvis. TTP over R gluteals and R QL Grip strength R average 40 lb and L average 59 lb  R hand dominant LUMBAR ROM:    Active  A/PROM  eval  Flexion Fingers to ankles but pain on rising upright  Extension 15  with pain on R   Right lateral flexion Fingertip to 1 inch above knee jt    Left lateral flexion Finger tip to 2 in above knee jt  Right rotation 50% pain to pulling from shld ot back  Left rotation 50%   (Blank rows = not tested)   LOWER EXTREMITY ROM:      Active  Right eval Left eval  Hip flexion 60 80  Hip extension      Hip abduction      Hip adduction      Hip internal rotation      Hip external rotation      Knee flexion 120 118  Knee extension -5 -5  Ankle dorsiflexion      Ankle plantarflexion      Ankle inversion      Ankle eversion       (Blank rows = not tested)   LOWER EXTREMITY MMT:   Pain with R LE movment    MMT Right eval Left eval  Hip flexion 3- 4-  Hip extension 3- 4-  Hip abduction 3- 3+  Hip adduction      Hip internal rotation      Hip external rotation      Knee flexion 4- 4+  Knee extension 4- 4  Ankle dorsiflexion 3+ 4  Ankle  plantarflexion 3/25 3/25  Ankle inversion      Ankle eversion       (Blank rows = not tested)   LUMBAR SPECIAL TESTS:  Straight leg raise test: Positive, Slump test: Negative, and FABER  test: Positive Right side pain with movement    FUNCTIONAL TESTS:  5 times sit to stand: 41.08 sec 05-23-22 5 x STS  20.06 sec 2 minute walk test: 278 ft Berg Balance Scale: 35/56  must use walker at all times for safety   05-16-22 6MWT  927 feet ( normal 1635-1980 ft)   GAIT: Distance walked: 280 feet Assistive device utilized: Single point cane Level of assistance: SBA Comments: 2 MWT 267ft. Normal for age 75 feet Pt with R antalgic gait with lateral trunk lean to left.  Decreased trunk rotation  Decreased stance time on R    2.13 ft /sec gait velocity    BERG BALANCE   Sitting to Standing: Numbers; 0-4: 2            4. Stands without using hands and stabilize independently            3. Stands independently using hands            2. Stands using hands after multiple trials            1. Min A to stand            0. Mod-Max A to stand Standing unsupported: Numbers; 0-4: 4            4. Stands safely for 2 minutes            3. Stands 2 minutes with supervision            2. Stands 30 seconds unsupported            1. Needs several tries to stand unsupported for 30 seconds            0. Unable to stand unsupported for 30 seconds Sitting unsupported: Numbers; 0-4: 4            4. Sits for 2 minutes independently            3. Sits for 2 minutes with supervision            2. Able to sit 30 seconds            1. Able to sit 10 seconds            0. Unable to sit for 10 seconds Standing to Sitting: Numbers; 0-4: 3 4. Sits safely with minimal use of hands 3. Controls descent with hands 2. Uses back of legs against chair to control descent 1. Sits independently, but uncontrolled descent 0. Needs assistance Transfers: Numbers; 0-4: 3            4. Transfers safely with minor use of  hands            3. Transfers safely definite use of hands            2. Transfers with verbal cueing/supervision            1. Needs 1 person assist            0. Needs 2 person assist  Standing with eyes closed: Numbers; 0-4: 3            4. Stands safely for 10 seconds            3. Stands 10 seconds with supervision             2. Able to stand for 3 seconds            1. Unable to  keep eyes closed for 3 seconds, but is safe            0. Needs assist to keep from falling Standing with feet together: Numbers; 0-4: 2 4. Stands for 1 minute safely 3. Stands for 1 minute with supervision 2. Unable to hold for 30 seconds            1. Needs help to attain position but can hold for 15 seconds            0. Needs help to attain position and unable to hold for 15 seconds Reaching forward with outstretched arm: Numbers; 0-4: 3            4. Reaches forward 10 inches            3. Reaches forward 5 inches            2. Reaches forward 2 inches            1. Reaches forward with supervision            0. Loses balance/requires assistace Retrieving object from the floor: Numbers; 0-4: 2 4. Able to pick up easily and safely 3. Able to pick up with supervision 2. Unable to pick up, but reaches within 1-2 inches independently 1. Unable to pick up and needs supervision 0. Unable/needs assistance to keep from falling  Turning to look behind: Numbers; 0-4: 2            4. Looks behind from both sides and weight shifts well            3. Looks behind one side only, other side less weight shift            2. Turns sideways only, maintains balance            1. Needs supervision when turning            0. Needs assistance  Turning 360 degrees: Numbers; 0-4: 2            4. Able to turn in </=4 seconds            3. Able to turn on one side in </= 4 seconds             2. Able to turn slowly, but safely            1. Needs supervision or verbal cueing            0. Needs assistance Place  alternate foot on stool: Numbers; 0-4: 2 4. Completes 8 steps in 20 seconds 3. Completes 8 steps in >20 seconds 2. 4 steps without assistance/supervision 1. Completes >2 steps with minimal assist 0. Unable, needs assist to keep from falling Standing with one foot in front: Numbers; 0-4: 2            4. Independent tandem for 30 seconds            3. Independent foot ahead for 30 seconds            2. Independent small step for 30 seconds            1. Needs help to step, but can hold for 15 seconds            0. Loses balance while standing/stepping Standing on one foot: Numbers; 0-4: 1 4. Holds >10 seconds 3. Holds 5-10 seconds 2. Holds >/=3 seconds  1. Holds <3 seconds 0.  Unable    Total Score: 35/56  Pt recommended to use walker at all times for now due to BERG test   TODAY'S TREATMENT  [facility] Adult PT Treatment:                                                DATE: 05-25-22 Therapeutic Exercise: All exercise encouraging PPT to relieve pain and pressure  Sitting  Posterior Pelvic Tilt  2 x 10 5 sec hold to decrease back tension Clamshell with Resistance  RTB 2 x 10 VC for PPT with exercise to relieve back tension Sit to Stand  2 x 10 Standing Marching RTB 3 x 10 bil Seated Knee Extension with RTB 3 x 10 bil  Standing Hamstring Curl with  RTB 3 x 10 bil Standing Hip Abduction with RTB 3 x 10 bil Standing Hip Extension with RTB 3 x 10 bil Marching with Resistance  RTB 3 x 10       [facility] Adult PT Treatment:                                                DATE: 05-23-22 05-23-22 5 x STS  20.06 sec Therapeutic Exercise: Recumbent Bike 5 min level 4 RPE 5/6 March with RTB resistance 2 x 10 on R and L SLS with hip extension with KB touch on R and L 2 x 10 with UE support Squats from chair with added there ex cushion  3 x 5 resp with 25 lb KB Ham curls with RTB 1 x 10 LAQ 2 x 10 bilateral  with GTB for resistance Standing hip extension 2 x 10 R and L each with RTB Standing hip  abduction 2 x 10  R and L each with RTB  Neuromuscular re-ed: Single limb KB touch with L UE on counter. 2 x 10 Single limb KB touch with  R UE on counter. 2 x 10 Toe touch SLS on R and then left 1 min each with no UE support SLS on there ex pad R and then left 1 min each several attempts Semi-tandem stance on there ex pad x30" BIL    [facility] Adult PT Treatment:                                                DATE: 05-16-22  05-16-22 6MWT  927 feet ( normal 1635-1980 ft) Therapeutic Exercise: Nustep level 5 x 5 mins Sink squats 3 x 10 with UE support then  did 6 MWT Childs pose forward and side to side Supine posterior pelvic tilt 5" hold 2 x 10 VC to bend knees Bridges x10  SLR partial range 2 x 10 bilateral  Sidelying clamshell 1 x 10 bilateral green band  LAQ 2 x 10 bilateral  with GTB for resistance Standing march 2 x 10 Standing hip abduction 2 x 10 bilateral   Neuromuscular re-ed: Romberg stance 2x30" 1 set with head turns SLS on R and L practicing for 2 minutes no more than 10 sec at longest Semi-tandem stance x30" BIL   [facility] Adult PT  Treatment:                                                DATE: 05/10/2022 Therapeutic Exercise: Nustep level 5 x 5 mins Supine posterior pelvic tilt 5" hold 2 x 10 Bridges x10 (hamstring cramp) SLR partial range 2 x 10 bilateral  Sidelying clamshell 1 x 10 bilateral green band  Sit to stand arms crossed 1 x 10  LAQ 2 x 10 bilateral  Standing march 2 x 10 Standing hip abduction 2 x 10 bilateral  Neuromuscular re-ed: Romberg stance 2x30" 1 set with head turns Semi-tandem stance x30" BIL   [facility] Adult PT Treatment:                                                DATE: 05/03/22 Therapeutic Exercise: Supine posterior pelvic tilt 1 x 10 SLR partial range 1 x 10 bilateral  Clamshell 1 x 10 bilateral green band  Sit to stand raised height 1 x 10  LAQ 1 x 10 bilateral  Standing march 1 x 10 Standing hip abduction 1 x 10 bilateral       PATIENT EDUCATION:  Education details: HEP Person educated: Patient Education method: Musician, cues, and Handouts Education comprehension: verbalized understanding,cues, returned demo, further instruction required      HOME EXERCISE PROGRAM: Access Code: [email or web address] URL: [email or web address] Date: 05/25/2022 Prepared by: Voncille Lo  Exercises - Supine Posterior Pelvic Tilt  - 1 x daily - 7 x weekly - 2 sets - 10 reps - Clamshell with Resistance  - 1 x daily - 7 x weekly - 2 sets - 10 reps - Sit to Stand  - 1 x daily - 7 x weekly - 2 sets - 10 reps - Standing Marching  - 1 x daily - 7 x weekly - 2 sets - 10 reps - Seated Knee Extension with Resistance  - 1 x daily - 7 x weekly - 3 sets - 10 reps - Standing Hamstring Curl with Resistance  - 1 x daily - 7 x weekly - 3 sets - 10 reps - Hip Abduction with Resistance Loop  - 1 x daily - 7 x weekly - 3 sets - 10 reps - Standing Hip Extension with Resistance at Ankles and Counter Support  - 1 x daily - 7 x weekly - 3 sets - 10 reps - Marching with Resistance  - 1 x daily - 7 x weekly - 3 sets - 10 reps    ASSESSMENT:   CLINICAL IMPRESSION:  Ms Romberg returns today for exercise and strengthening using resistance bands and updating HEP for standing exercises to improve endurance and work on SLS functional movement with UE support at this time.  Pt today concentrated on endurance with standing exercises and balance neuro reeducation  Pt working on LTG now and was unable to achieve balance SLS  greater than 5 sec each R and L.   Pt continues to have balance issues with SL balance and need for full time use of cane. Pt will begin aquatics on 05-31-22.    EVAL- Patient presents  to clinic utilizing [facility] that needed adjustment and  CC of back pain and  L foot pain ( bone  spur post injection) especially after traveling to [facility] to retrieve pregnant homeless daughter and her back is in spasm. She returned at 4:00 AM this  morning. She has not had time to  exercise but knows she needs to prioritize her own health as she will soon be caring for dtr and new grandbaby due date July 19. Session today focused  endurance with 6 MWT and on core, proximal hip and LE strengthening with balance.    Patient continues to benefit from skilled PT services and should be progressed as able to improve functional independence.     OBJECTIVE IMPAIRMENTS decreased activity tolerance, decreased balance, decreased mobility, difficulty walking, decreased ROM, decreased strength, improper body mechanics, postural dysfunction, obesity, and pain.    ACTIVITY LIMITATIONS carrying, bending, sitting, standing, squatting, sleeping, stairs, transfers, dressing, and locomotion level   PARTICIPATION LIMITATIONS: driving, shopping, and exercising and    PERSONAL FACTORS L TKA 01-26-21, DDD, cardiac cath 2009, Sciatica, RTC syndrome, plantar fasciitis, carpal tunnel, OA, obesity,  See medical hx are also affecting patient's functional outcome.    REHAB POTENTIAL: Fair Pt currently has aide to care for needs at home domestic chores and some cooking   CLINICAL DECISION MAKING: Evolving/moderate complexity   EVALUATION COMPLEXITY: Moderate     GOALS: Goals reviewed with patient? Yes   SHORT TERM GOALS: Target date: 05/23/2022   Pt independent with initial HEP Baseline:Pt needs to reinforce HEP and consistency of exercise Goal status: MET   2.  Pt will perform 6 minutes walk test.  Baseline: only able to tolerate 2 minute walk test 278 ft (579 normal)  Pt unable to stand for more than 5-10 min due to pain  05-16-22 6MWT  927 feet ( normal 1635-1980 ft) Goal status: I NOT MET by goal   3.  Pt will return to aquatic therapy to update HEP for use on her own at local pool Baseline: Pt currently with no routine exercise program due to pain, Pt has not been to aquatics yet 05-16-22.  Beginning aquatic therapy 05-31-22 Goal status: MET   4.  Pt will  improve SLS to at least 15 sec on R and L to show improved balance Baseline: unable to tolerate more than 3 sec SLS 05-25-22 less than 5 sec each Goal status: NOT MET not met by  goal       LONG TERM GOALS: Target date: 06/20/2022   Pt will be independent with advanced HEP on land in order to maintain strength Baseline: Pt currently not doing any routine exercise  05-25-22 incorporating some exercise every day Goal status: ONGOING   2.  Return to walking/standing for 25 minutes without stopping using LRAD in order to shop Baseline: Currently not doing any exercise and cannot stand or walk greater than 5-10 minutes and must use riding grocery cart 05-25-22  walking around and caring for baby grand daughter just born Goal status: ONGOING   3.  Pt will use pain management in order to sleep for 4 or more hour of uninterrupted sleep Baseline: pt awakens every 2 hours due to pain and wakes whenever she turns in bed 05-23-22 Pt reports 3-4 hours of sleep a night Goal status: ONGOING   4.  Pt will be able to negotiate steps without exacerbation of pain greater than 3/10 Baseline: 7/10 currently in eval but can rise to 10/10 Goal status: ONGOING   5.  BERG balance with increase at least 10 points to 45/56 to show decreased risk of  fall with LRAD Baseline: eval 35/56 using cane but told to utlize walker at all times Goal status: ONGOING   6.  ODI will improve  to at least 30/50 to show improvement in functional mobility Baseline: 40/50 80% disabilty Goal status: ONGOING   7. Pt will demonstrate floor to stand transfer safely for falls preparedness and decrease risk of fall            Baseline: pt unable to transfer floor to stand and has had 3 falls in last 6 months.            Goal status ONGOING   8. Pt will incorporate regular land based exercise in order to maintain strength for ADL/household tasks            Baseline:  Pt unable to do any regular exercise due to pain and weakness.             Goal status ONGOING     PLAN: PT FREQUENCY: 2x/week   PT DURATION: 8 weeks   PLANNED INTERVENTIONS: Therapeutic exercises, Therapeutic activity, Neuromuscular re-education, Balance training, Gait training, Patient/Family education, and Joint mobilization.   PLAN FOR NEXT SESSION: progress strength and balance as tolerated. Review HEP.   Add resisted knee flex and ext.  ADD lunges and prepare for floor transfer etc  Voncille Lo, PT, [facility] Certified Exercise Expert for the Aging Adult  05/25/22 12:39 PM Phone: [phone] Fax: [phone]

## 2022-05-24 DIAGNOSIS — M1712 Unilateral primary osteoarthritis, left knee: Secondary | ICD-10-CM | POA: Diagnosis not present

## 2022-05-25 ENCOUNTER — Ambulatory Visit: Payer: Medicaid Other | Admitting: Physical Therapy

## 2022-05-25 ENCOUNTER — Encounter: Payer: Self-pay | Admitting: Physical Therapy

## 2022-05-25 DIAGNOSIS — M6281 Muscle weakness (generalized): Secondary | ICD-10-CM | POA: Diagnosis not present

## 2022-05-25 DIAGNOSIS — G8929 Other chronic pain: Secondary | ICD-10-CM | POA: Diagnosis not present

## 2022-05-25 DIAGNOSIS — R262 Difficulty in walking, not elsewhere classified: Secondary | ICD-10-CM

## 2022-05-25 DIAGNOSIS — R2681 Unsteadiness on feet: Secondary | ICD-10-CM | POA: Diagnosis not present

## 2022-05-25 DIAGNOSIS — M5440 Lumbago with sciatica, unspecified side: Secondary | ICD-10-CM | POA: Diagnosis not present

## 2022-05-25 DIAGNOSIS — M25662 Stiffness of left knee, not elsewhere classified: Secondary | ICD-10-CM

## 2022-05-25 DIAGNOSIS — M25562 Pain in left knee: Secondary | ICD-10-CM | POA: Diagnosis not present

## 2022-05-25 DIAGNOSIS — M542 Cervicalgia: Secondary | ICD-10-CM

## 2022-05-25 DIAGNOSIS — M1712 Unilateral primary osteoarthritis, left knee: Secondary | ICD-10-CM | POA: Diagnosis not present

## 2022-05-25 DIAGNOSIS — R296 Repeated falls: Secondary | ICD-10-CM | POA: Diagnosis not present

## 2022-05-25 DIAGNOSIS — R252 Cramp and spasm: Secondary | ICD-10-CM

## 2022-05-26 DIAGNOSIS — M1712 Unilateral primary osteoarthritis, left knee: Secondary | ICD-10-CM | POA: Diagnosis not present

## 2022-05-27 DIAGNOSIS — M1712 Unilateral primary osteoarthritis, left knee: Secondary | ICD-10-CM | POA: Diagnosis not present

## 2022-05-28 DIAGNOSIS — M1712 Unilateral primary osteoarthritis, left knee: Secondary | ICD-10-CM | POA: Diagnosis not present

## 2022-05-29 ENCOUNTER — Ambulatory Visit: Payer: Medicaid Other | Admitting: Physical Therapy

## 2022-05-29 ENCOUNTER — Encounter: Payer: Self-pay | Admitting: Physical Therapy

## 2022-05-29 DIAGNOSIS — G8929 Other chronic pain: Secondary | ICD-10-CM | POA: Diagnosis not present

## 2022-05-29 DIAGNOSIS — R2681 Unsteadiness on feet: Secondary | ICD-10-CM | POA: Diagnosis not present

## 2022-05-29 DIAGNOSIS — M542 Cervicalgia: Secondary | ICD-10-CM | POA: Diagnosis not present

## 2022-05-29 DIAGNOSIS — R296 Repeated falls: Secondary | ICD-10-CM | POA: Diagnosis not present

## 2022-05-29 DIAGNOSIS — R252 Cramp and spasm: Secondary | ICD-10-CM | POA: Diagnosis not present

## 2022-05-29 DIAGNOSIS — M5440 Lumbago with sciatica, unspecified side: Secondary | ICD-10-CM | POA: Diagnosis not present

## 2022-05-29 DIAGNOSIS — M6281 Muscle weakness (generalized): Secondary | ICD-10-CM | POA: Diagnosis not present

## 2022-05-29 DIAGNOSIS — M25562 Pain in left knee: Secondary | ICD-10-CM | POA: Diagnosis not present

## 2022-05-29 DIAGNOSIS — M25662 Stiffness of left knee, not elsewhere classified: Secondary | ICD-10-CM | POA: Diagnosis not present

## 2022-05-29 DIAGNOSIS — R262 Difficulty in walking, not elsewhere classified: Secondary | ICD-10-CM | POA: Diagnosis not present

## 2022-05-29 DIAGNOSIS — M1712 Unilateral primary osteoarthritis, left knee: Secondary | ICD-10-CM | POA: Diagnosis not present

## 2022-05-29 NOTE — Therapy (Signed)
OUTPATIENT PHYSICAL THERAPY TREATMENT NOTE   Patient Name: Katie [name] MRN: [id number] DOB:1966-11-02, 56 y.o., female Today's Date: 05/29/2022  PCP: Elwyn Reach, MD REFERRING PROVIDER: Elwyn Reach, MD  END OF SESSION:   PT End of Session - 05/29/22 1319     Visit Number 7    Number of Visits 16    Date for PT Re-Evaluation 06/20/22    Authorization Type [employer] NO tx, estim unattended , [facility] [place]    Authorization Time Period 05/25/22-06/23/22    Authorization - Visit Number 2    Authorization - Number of Visits 4    PT Start Time 1315    PT Stop Time 4098    PT Time Calculation (min) 42 min                 Past Medical History:  Diagnosis Date   Anxiety    Back pain    Bipolar 1 disorder ([place])    Chronic pain entered 08/15/2015   Treated with Oxycodone   Depression    Depression    Dyspnea    History of acute bronchitis    HTN (hypertension)    OA (osteoarthritis)    Obesity    PE (pulmonary embolism)    oct 2014   Sciatica    Past Surgical History:  Procedure Laterality Date   ADENOIDECTOMY     BIOPSY STOMACH     CARDIAC CATHETERIZATION  2009   normal; in [place]  2002   x 2, 1997 and 2002   CHOLECYSTECTOMY  2007   KNEE SURGERY Right 2005   TONSILLECTOMY     TOTAL KNEE ARTHROPLASTY Left 01/27/2020   Procedure: TOTAL KNEE ARTHROPLASTY;  Surgeon: Renette Butters, MD;  Location: [facility];  Service: Orthopedics;  Laterality: Left;   TYMPANOSTOMY TUBE PLACEMENT     Patient Active Problem List   Diagnosis Date Noted   Allergic reaction caused by a drug 07/26/2021   Primary osteoarthritis of left knee 12/29/2019   Morbid obesity ([place])    Bradycardia    Pulmonary embolism on long-term anticoagulation therapy ([place]) 04/06/2016   Pain in the chest    Chronic pain    Chest wall pain    Pulmonary embolism ([place]) 07/31/2013   Vocal cord dysfunction 02/28/2012   Dyspnea 01/17/2012   Chest pain 11/25/2011    Hypokalemia 11/25/2011   GERD (gastroesophageal reflux disease) 03/08/2011   DYSMENORRHEA 11/10/2010   PLANTAR FASCIITIS 09/14/2010   OSTEOARTHRITIS, KNEES, BILATERAL, SEVERE 06/22/2010   ROTATOR CUFF SYNDROME, RIGHT 04/11/2010   VITAMIN D DEFICIENCY 02/18/2010   Bipolar 1 disorder ([place]) 01/17/2010   Essential hypertension, benign 01/17/2010   INSOMNIA 01/17/2010   OBESITY, NOS 01/03/2007   Depression 01/03/2007   HEARING LOSS NOS OR DEAFNESS 01/03/2007   EDEMA-LEGS,DUE TO VENOUS OBSTRUCT. 01/03/2007   Irritable bowel syndrome 01/03/2007    REFERRING DIAG: Lumbar radicular pain & spondylosis  THERAPY DIAG:  Chronic right-sided low back pain with sciatica, sciatica laterality unspecified  Unsteadiness on feet  Muscle weakness (generalized)  Rationale for Evaluation and Treatment Rehabilitation  PERTINENT HISTORY: L TKA 01-26-21, DDD, cardiac cath 2009, Sciatica, RTC syndrome, plantar fasciitis, carpal tunnel, OA, obesisty,  Se medical hx  PRECAUTIONS: Other: ALERGIC to percocet, hydrocodone, Vicodin, Aleve  SUBJECTIVE: Back is a little better. Balance is a little better.   PAIN:  Are you having pain? Yes: NPRS scale: 5/10 back, Left knee 6/10 and R 4/10  Pain location: back and kees Pain description: swollen Aggravating factors: movement Relieving factors: rest   OBJECTIVE: (objective measures completed at initial evaluation unless otherwise dated)   OBJECTIVE:    DIAGNOSTIC FINDINGS:  No recent Lumbar images IMPRESSION: 01-20-22 Cervical Minor degenerative changes as detailed above without significant stenosis.  COMPARISON:  June 2022   FINDINGS: Alignment: No significant listhesis.   Vertebrae: Cervical vertebral body heights are maintained. No marrow edema. No suspicious osseous lesion.   Cord: No abnormal signal.   Posterior Fossa, vertebral arteries, paraspinal tissues: Unremarkable.   PATIENT SURVEYS:  Modified Oswestry 40/50   80% disability     SCREENING FOR RED FLAGS: Bowel or bladder incontinence: No Cauda equina syndrome: No   COGNITION:           Overall cognitive status: Within functional limits for tasks assessed                          SENSATION: Pt complains of numbness into entire hand of R hand    MUSCLE LENGTH: Hamstrings:  bil tight ness approximaltely 20 degrees     POSTURE: rounded shoulders, forward head, and Pt has had weight loss of 90 lbs in last 4 years   PALPATION:  Lumbar paraspinal tightness.  R elevated pelvis. TTP over R gluteals and R QL Grip strength R average 40 lb and L average 59 lb  R hand dominant LUMBAR ROM:    Active  A/PROM  eval  Flexion Fingers to ankles but pain on rising upright  Extension 15  with pain on R   Right lateral flexion Fingertip to 1 inch above knee jt    Left lateral flexion Finger tip to 2 in above knee jt  Right rotation 50% pain to pulling from shld ot back  Left rotation 50%   (Blank rows = not tested)   LOWER EXTREMITY ROM:      Active  Right eval Left eval  Hip flexion 60 80  Hip extension      Hip abduction      Hip adduction      Hip internal rotation      Hip external rotation      Knee flexion 120 118  Knee extension -5 -5  Ankle dorsiflexion      Ankle plantarflexion      Ankle inversion      Ankle eversion       (Blank rows = not tested)   LOWER EXTREMITY MMT:   Pain with R LE movment    MMT Right eval Left eval  Hip flexion 3- 4-  Hip extension 3- 4-  Hip abduction 3- 3+  Hip adduction      Hip internal rotation      Hip external rotation      Knee flexion 4- 4+  Knee extension 4- 4  Ankle dorsiflexion 3+ 4  Ankle plantarflexion 3/25 3/25  Ankle inversion      Ankle eversion       (Blank rows = not tested)   LUMBAR SPECIAL TESTS:  Straight leg raise test: Positive, Slump test: Negative, and FABER test: Positive Right side pain with movement    FUNCTIONAL TESTS:  5 times sit to stand: 41.08 sec 05-23-22 5 x STS   20.06 sec 2 minute walk test: 278 ft Berg Balance Scale: 35/56  must use walker at all times for safety   05-16-22 6MWT  927 feet ( normal 1635-1980  ft)   GAIT: Distance walked: 280 feet Assistive device utilized: Single point cane Level of assistance: SBA Comments: 2 MWT 256ft. Normal for age 12 feet Pt with R antalgic gait with lateral trunk lean to left.  Decreased trunk rotation  Decreased stance time on R    2.13 ft /sec gait velocity    BERG BALANCE   Sitting to Standing: Numbers; 0-4: 2            4. Stands without using hands and stabilize independently            3. Stands independently using hands            2. Stands using hands after multiple trials            1. Min A to stand            0. Mod-Max A to stand Standing unsupported: Numbers; 0-4: 4            4. Stands safely for 2 minutes            3. Stands 2 minutes with supervision            2. Stands 30 seconds unsupported            1. Needs several tries to stand unsupported for 30 seconds            0. Unable to stand unsupported for 30 seconds Sitting unsupported: Numbers; 0-4: 4            4. Sits for 2 minutes independently            3. Sits for 2 minutes with supervision            2. Able to sit 30 seconds            1. Able to sit 10 seconds            0. Unable to sit for 10 seconds Standing to Sitting: Numbers; 0-4: 3 4. Sits safely with minimal use of hands 3. Controls descent with hands 2. Uses back of legs against chair to control descent 1. Sits independently, but uncontrolled descent 0. Needs assistance Transfers: Numbers; 0-4: 3            4. Transfers safely with minor use of hands            3. Transfers safely definite use of hands            2. Transfers with verbal cueing/supervision            1. Needs 1 person assist            0. Needs 2 person assist  Standing with eyes closed: Numbers; 0-4: 3            4. Stands safely for 10 seconds            3. Stands 10 seconds with  supervision             2. Able to stand for 3 seconds            1. Unable to keep eyes closed for 3 seconds, but is safe            0. Needs assist to keep from falling Standing with feet together: Numbers; 0-4: 2 4. Stands for 1 minute safely 3. Stands for 1 minute with supervision 2. Unable to hold for 30 seconds  1. Needs help to attain position but can hold for 15 seconds            0. Needs help to attain position and unable to hold for 15 seconds Reaching forward with outstretched arm: Numbers; 0-4: 3            4. Reaches forward 10 inches            3. Reaches forward 5 inches            2. Reaches forward 2 inches            1. Reaches forward with supervision            0. Loses balance/requires assistace Retrieving object from the floor: Numbers; 0-4: 2 4. Able to pick up easily and safely 3. Able to pick up with supervision 2. Unable to pick up, but reaches within 1-2 inches independently 1. Unable to pick up and needs supervision 0. Unable/needs assistance to keep from falling  Turning to look behind: Numbers; 0-4: 2            4. Looks behind from both sides and weight shifts well            3. Looks behind one side only, other side less weight shift            2. Turns sideways only, maintains balance            1. Needs supervision when turning            0. Needs assistance  Turning 360 degrees: Numbers; 0-4: 2            4. Able to turn in </=4 seconds            3. Able to turn on one side in </= 4 seconds             2. Able to turn slowly, but safely            1. Needs supervision or verbal cueing            0. Needs assistance Place alternate foot on stool: Numbers; 0-4: 2 4. Completes 8 steps in 20 seconds 3. Completes 8 steps in >20 seconds 2. 4 steps without assistance/supervision 1. Completes >2 steps with minimal assist 0. Unable, needs assist to keep from falling Standing with one foot in front: Numbers; 0-4: 2            4.  Independent tandem for 30 seconds            3. Independent foot ahead for 30 seconds            2. Independent small step for 30 seconds            1. Needs help to step, but can hold for 15 seconds            0. Loses balance while standing/stepping Standing on one foot: Numbers; 0-4: 1 4. Holds >10 seconds 3. Holds 5-10 seconds 2. Holds >/=3 seconds  1. Holds <3 seconds 0. Unable    Total Score: 35/56  Pt recommended to use walker at all times for now due to BERG test   TODAY'S TREATMENT  [facility] Adult PT Treatment:  DATE: 05/29/22 Therapeutic Exercise: Nustep L5 UE/ LE x 6 min Knee extension OMEGA 10 # bilateral 10 x 2  Knee Flexion OMEGA 25# 10 x 2  SLS 8 sec Right, 12 sec Left  15 sec tandem stance Forward lunge with 1 UE on freemotion bar x 5 each Lunge to airex pad using bilateral chair support x 5 each  STS 25# Squat to chair tap x 10- elevated with airex Bridge x 10 PPT x 10 Supine March with ab brace  Table top (up/down one Leg at a time) x 5    [facility] Adult PT Treatment:                                                DATE: 05-25-22 Therapeutic Exercise: All exercise encouraging PPT to relieve pain and pressure  Sitting  Posterior Pelvic Tilt  2 x 10 5 sec hold to decrease back tension Clamshell with Resistance  RTB 2 x 10 VC for PPT with exercise to relieve back tension Sit to Stand  2 x 10 Standing Marching RTB 3 x 10 bil Seated Knee Extension with RTB 3 x 10 bil  Standing Hamstring Curl with  RTB 3 x 10 bil Standing Hip Abduction with RTB 3 x 10 bil Standing Hip Extension with RTB 3 x 10 bil Marching with Resistance  RTB 3 x 10       [facility] Adult PT Treatment:                                                DATE: 05-23-22 05-23-22 5 x STS  20.06 sec Therapeutic Exercise: Recumbent Bike 5 min level 4 RPE 5/6 March with RTB resistance 2 x 10 on R and L SLS with hip extension with KB touch on R and L 2 x 10 with UE  support Squats from chair with added there ex cushion  3 x 5 resp with 25 lb KB Ham curls with RTB 1 x 10 LAQ 2 x 10 bilateral  with GTB for resistance Standing hip extension 2 x 10 R and L each with RTB Standing hip abduction 2 x 10  R and L each with RTB  Neuromuscular re-ed: Single limb KB touch with L UE on counter. 2 x 10 Single limb KB touch with  R UE on counter. 2 x 10 Toe touch SLS on R and then left 1 min each with no UE support SLS on there ex pad R and then left 1 min each several attempts Semi-tandem stance on there ex pad x30" BIL    [facility] Adult PT Treatment:                                                DATE: 05-16-22  05-16-22 6MWT  927 feet ( normal 1635-1980 ft) Therapeutic Exercise: Nustep level 5 x 5 mins Sink squats 3 x 10 with UE support then  did 6 MWT Childs pose forward and side to side Supine posterior pelvic tilt 5" hold 2 x 10 VC to bend knees Bridges x10  SLR partial range  2 x 10 bilateral  Sidelying clamshell 1 x 10 bilateral green band  LAQ 2 x 10 bilateral  with GTB for resistance Standing march 2 x 10 Standing hip abduction 2 x 10 bilateral   Neuromuscular re-ed: Romberg stance 2x30" 1 set with head turns SLS on R and L practicing for 2 minutes no more than 10 sec at longest Semi-tandem stance x30" BIL   [facility] Adult PT Treatment:                                                DATE: 05/10/2022 Therapeutic Exercise: Nustep level 5 x 5 mins Supine posterior pelvic tilt 5" hold 2 x 10 Bridges x10 (hamstring cramp) SLR partial range 2 x 10 bilateral  Sidelying clamshell 1 x 10 bilateral green band  Sit to stand arms crossed 1 x 10  LAQ 2 x 10 bilateral  Standing march 2 x 10 Standing hip abduction 2 x 10 bilateral  Neuromuscular re-ed: Romberg stance 2x30" 1 set with head turns Semi-tandem stance x30" BIL   [facility] Adult PT Treatment:                                                DATE: 05/03/22 Therapeutic Exercise: Supine posterior pelvic  tilt 1 x 10 SLR partial range 1 x 10 bilateral  Clamshell 1 x 10 bilateral green band  Sit to stand raised height 1 x 10  LAQ 1 x 10 bilateral  Standing march 1 x 10 Standing hip abduction 1 x 10 bilateral      PATIENT EDUCATION:  Education details: HEP Person educated: Patient Education method: Musician, cues, and Handouts Education comprehension: verbalized understanding,cues, returned demo, further instruction required      HOME EXERCISE PROGRAM: Access Code: [email or web address] URL: [email or web address] Date: 05/25/2022 Prepared by: Voncille Lo  Exercises - Supine Posterior Pelvic Tilt  - 1 x daily - 7 x weekly - 2 sets - 10 reps - Clamshell with Resistance  - 1 x daily - 7 x weekly - 2 sets - 10 reps - Sit to Stand  - 1 x daily - 7 x weekly - 2 sets - 10 reps - Standing Marching  - 1 x daily - 7 x weekly - 2 sets - 10 reps - Seated Knee Extension with Resistance  - 1 x daily - 7 x weekly - 3 sets - 10 reps - Standing Hamstring Curl with Resistance  - 1 x daily - 7 x weekly - 3 sets - 10 reps - Hip Abduction with Resistance Loop  - 1 x daily - 7 x weekly - 3 sets - 10 reps - Standing Hip Extension with Resistance at Ankles and Counter Support  - 1 x daily - 7 x weekly - 3 sets - 10 reps - Marching with Resistance  - 1 x daily - 7 x weekly - 3 sets - 10 reps    ASSESSMENT:   CLINICAL IMPRESSION:  Pt demonstrates improved SLS to 8-12 sec Right and left. She is compliant with HEP. Advanced knee strengthening with gym machines today and began lunges. She did well without increased pain.  Advanced mat based core strength today with good tolerance.    EVAL- Patient  presents  to clinic utilizing [facility] that needed adjustment and  CC of back pain and  L foot pain ( bone spur post injection) especially after traveling to [facility] to retrieve pregnant homeless daughter and her back is in spasm. She returned at 4:00 AM this morning. She has not had time to  exercise but  knows she needs to prioritize her own health as she will soon be caring for dtr and new grandbaby due date July 19. Session today focused  endurance with 6 MWT and on core, proximal hip and LE strengthening with balance.    Patient continues to benefit from skilled PT services and should be progressed as able to improve functional independence.     OBJECTIVE IMPAIRMENTS decreased activity tolerance, decreased balance, decreased mobility, difficulty walking, decreased ROM, decreased strength, improper body mechanics, postural dysfunction, obesity, and pain.    ACTIVITY LIMITATIONS carrying, bending, sitting, standing, squatting, sleeping, stairs, transfers, dressing, and locomotion level   PARTICIPATION LIMITATIONS: driving, shopping, and exercising and    PERSONAL FACTORS L TKA 01-26-21, DDD, cardiac cath 2009, Sciatica, RTC syndrome, plantar fasciitis, carpal tunnel, OA, obesity,  See medical hx are also affecting patient's functional outcome.    REHAB POTENTIAL: Fair Pt currently has aide to care for needs at home domestic chores and some cooking   CLINICAL DECISION MAKING: Evolving/moderate complexity   EVALUATION COMPLEXITY: Moderate     GOALS: Goals reviewed with patient? Yes   SHORT TERM GOALS: Target date: 05/23/2022   Pt independent with initial HEP Baseline:Pt needs to reinforce HEP and consistency of exercise Goal status: MET   2.  Pt will perform 6 minutes walk test.  Baseline: only able to tolerate 2 minute walk test 278 ft (579 normal)  Pt unable to stand for more than 5-10 min due to pain  05-16-22 6MWT  927 feet ( normal 1635-1980 ft) Goal status: I NOT MET by goal   3.  Pt will return to aquatic therapy to update HEP for use on her own at local pool Baseline: Pt currently with no routine exercise program due to pain, Pt has not been to aquatics yet 05-16-22.  Beginning aquatic therapy 05-31-22 Goal status: MET   4.  Pt will improve SLS to at least 15 sec on R and L to  show improved balance Baseline: unable to tolerate more than 3 sec SLS 05-25-22 less than 5 sec each Status 05/29/22: 8-12 sec  Goal status: NOT MET by goal date        LONG TERM GOALS: Target date: 06/20/2022   Pt will be independent with advanced HEP on land in order to maintain strength Baseline: Pt currently not doing any routine exercise  05-25-22 incorporating some exercise every day Goal status: ONGOING   2.  Return to walking/standing for 25 minutes without stopping using LRAD in order to shop Baseline: Currently not doing any exercise and cannot stand or walk greater than 5-10 minutes and must use riding grocery cart 05-25-22  walking around and caring for baby grand daughter just born Goal status: ONGOING   3.  Pt will use pain management in order to sleep for 4 or more hour of uninterrupted sleep Baseline: pt awakens every 2 hours due to pain and wakes whenever she turns in bed 05-23-22 Pt reports 3-4 hours of sleep a night Goal status: ONGOING   4.  Pt will be able to negotiate steps without exacerbation of pain greater than 3/10 Baseline: 7/10 currently in eval but  can rise to 10/10 Goal status: ONGOING   5.  BERG balance with increase at least 10 points to 45/56 to show decreased risk of fall with LRAD Baseline: eval 35/56 using cane but told to utlize walker at all times Goal status: ONGOING   6.  ODI will improve  to at least 30/50 to show improvement in functional mobility Baseline: 40/50 80% disabilty Goal status: ONGOING   7. Pt will demonstrate floor to stand transfer safely for falls preparedness and decrease risk of fall            Baseline: pt unable to transfer floor to stand and has had 3 falls in last 6 months.            Goal status ONGOING   8. Pt will incorporate regular land based exercise in order to maintain strength for ADL/household tasks            Baseline:  Pt unable to do any regular exercise due to pain and weakness.            Goal status  ONGOING     PLAN: PT FREQUENCY: 2x/week   PT DURATION: 8 weeks   PLANNED INTERVENTIONS: Therapeutic exercises, Therapeutic activity, Neuromuscular re-education, Balance training, Gait training, Patient/Family education, and Joint mobilization.   PLAN FOR NEXT SESSION: progress strength and balance as tolerated. Review HEP.   Add resisted knee flex and ext.  ADD lunges and prepare for floor transfer etc  Hessie Diener, PTA 05/29/22 1:57 PM Phone: [phone] Fax: [phone]

## 2022-05-30 DIAGNOSIS — M1712 Unilateral primary osteoarthritis, left knee: Secondary | ICD-10-CM | POA: Diagnosis not present

## 2022-05-30 NOTE — Therapy (Signed)
OUTPATIENT PHYSICAL THERAPY TREATMENT NOTE   Patient Name: Katie [name] MRN: [id number] DOB:1965-12-24, 56 y.o., female Today's Date: 05/31/2022  PCP: Elwyn Reach, MD REFERRING PROVIDER: Elwyn Reach, MD  END OF SESSION:   PT End of Session - 05/31/22 1544     Visit Number 8    Number of Visits 16    Date for PT Re-Evaluation 06/20/22    Authorization Type [employer] NO tx, estim unattended , [facility] [place]    Authorization Time Period 05/25/22-06/23/22    PT Start Time 1547    PT Stop Time 1635    PT Time Calculation (min) 48 min    Activity Tolerance Patient tolerated treatment well    Behavior During Therapy [facility] for tasks assessed/performed                  Past Medical History:  Diagnosis Date   Anxiety    Back pain    Bipolar 1 disorder ([place])    Chronic pain entered 08/15/2015   Treated with Oxycodone   Depression    Depression    Dyspnea    History of acute bronchitis    HTN (hypertension)    OA (osteoarthritis)    Obesity    PE (pulmonary embolism)    oct 2014   Sciatica    Past Surgical History:  Procedure Laterality Date   ADENOIDECTOMY     BIOPSY STOMACH     CARDIAC CATHETERIZATION  2009   normal; in [place]  2002   x 2, 1997 and 2002   CHOLECYSTECTOMY  2007   KNEE SURGERY Right 2005   TONSILLECTOMY     TOTAL KNEE ARTHROPLASTY Left 01/27/2020   Procedure: TOTAL KNEE ARTHROPLASTY;  Surgeon: Renette Butters, MD;  Location: [facility];  Service: Orthopedics;  Laterality: Left;   TYMPANOSTOMY TUBE PLACEMENT     Patient Active Problem List   Diagnosis Date Noted   Allergic reaction caused by a drug 07/26/2021   Primary osteoarthritis of left knee 12/29/2019   Morbid obesity ([place])    Bradycardia    Pulmonary embolism on long-term anticoagulation therapy ([place]) 04/06/2016   Pain in the chest    Chronic pain    Chest wall pain    Pulmonary embolism ([place]) 07/31/2013   Vocal cord dysfunction 02/28/2012    Dyspnea 01/17/2012   Chest pain 11/25/2011   Hypokalemia 11/25/2011   GERD (gastroesophageal reflux disease) 03/08/2011   DYSMENORRHEA 11/10/2010   PLANTAR FASCIITIS 09/14/2010   OSTEOARTHRITIS, KNEES, BILATERAL, SEVERE 06/22/2010   ROTATOR CUFF SYNDROME, RIGHT 04/11/2010   VITAMIN D DEFICIENCY 02/18/2010   Bipolar 1 disorder ([place]) 01/17/2010   Essential hypertension, benign 01/17/2010   INSOMNIA 01/17/2010   OBESITY, NOS 01/03/2007   Depression 01/03/2007   HEARING LOSS NOS OR DEAFNESS 01/03/2007   EDEMA-LEGS,DUE TO VENOUS OBSTRUCT. 01/03/2007   Irritable bowel syndrome 01/03/2007    REFERRING DIAG: Lumbar radicular pain & spondylosis  THERAPY DIAG:  Chronic right-sided low back pain with sciatica, sciatica laterality unspecified  Stiffness of left knee, not elsewhere classified  Difficulty in walking, not elsewhere classified  Muscle weakness (generalized)  Unsteadiness on feet  Repeated falls  Cervicalgia  Cramp and spasm  Acute pain of left knee  Rationale for Evaluation and Treatment Rehabilitation  PERTINENT HISTORY: L TKA 01-26-21, DDD, cardiac cath 2009, Sciatica, RTC syndrome, plantar fasciitis, carpal tunnel, OA, obesisty,  Se medical hx  PRECAUTIONS: Other: ALERGIC to percocet, hydrocodone,  Vicodin, Aleve  SUBJECTIVE: My Eldridge Abrahams is doing well  I am overall 4/10  I am generally doing better   PAIN:  Are you having pain? Yes: NPRS scale: 5/10 back, Left knee 6/10 and R 4/10   Pain location: back and kees Pain description: swollen Aggravating factors: movement Relieving factors: rest   OBJECTIVE: (objective measures completed at initial evaluation unless otherwise dated)   OBJECTIVE:    DIAGNOSTIC FINDINGS:  No recent Lumbar images IMPRESSION: 01-20-22 Cervical Minor degenerative changes as detailed above without significant stenosis.  COMPARISON:  June 2022   FINDINGS: Alignment: No significant listhesis.   Vertebrae: Cervical vertebral  body heights are maintained. No marrow edema. No suspicious osseous lesion.   Cord: No abnormal signal.   Posterior Fossa, vertebral arteries, paraspinal tissues: Unremarkable.   PATIENT SURVEYS:  Modified Oswestry 40/50   80% disability    SCREENING FOR RED FLAGS: Bowel or bladder incontinence: No Cauda equina syndrome: No   COGNITION:           Overall cognitive status: Within functional limits for tasks assessed                          SENSATION: Pt complains of numbness into entire hand of R hand    MUSCLE LENGTH: Hamstrings:  bil tight ness approximaltely 20 degrees     POSTURE: rounded shoulders, forward head, and Pt has had weight loss of 90 lbs in last 4 years   PALPATION:  Lumbar paraspinal tightness.  R elevated pelvis. TTP over R gluteals and R QL Grip strength R average 40 lb and L average 59 lb  R hand dominant LUMBAR ROM:    Active  A/PROM  eval  Flexion Fingers to ankles but pain on rising upright  Extension 15  with pain on R   Right lateral flexion Fingertip to 1 inch above knee jt    Left lateral flexion Finger tip to 2 in above knee jt  Right rotation 50% pain to pulling from shld ot back  Left rotation 50%   (Blank rows = not tested)   LOWER EXTREMITY ROM:      Active  Right eval Left eval  Hip flexion 60 80  Hip extension      Hip abduction      Hip adduction      Hip internal rotation      Hip external rotation      Knee flexion 120 118  Knee extension -5 -5  Ankle dorsiflexion      Ankle plantarflexion      Ankle inversion      Ankle eversion       (Blank rows = not tested)   LOWER EXTREMITY MMT:   Pain with R LE movment    MMT Right eval Left eval  Hip flexion 3- 4-  Hip extension 3- 4-  Hip abduction 3- 3+  Hip adduction      Hip internal rotation      Hip external rotation      Knee flexion 4- 4+  Knee extension 4- 4  Ankle dorsiflexion 3+ 4  Ankle plantarflexion 3/25 3/25  Ankle inversion      Ankle eversion        (Blank rows = not tested)   LUMBAR SPECIAL TESTS:  Straight leg raise test: Positive, Slump test: Negative, and FABER test: Positive Right side pain with movement    FUNCTIONAL TESTS:  5 times  sit to stand: 41.08 sec 05-23-22 5 x STS  20.06 sec 2 minute walk test: 278 ft Berg Balance Scale: 35/56  must use walker at all times for safety   05-16-22 6MWT  927 feet ( normal 1635-1980 ft)   GAIT: Distance walked: 280 feet Assistive device utilized: Single point cane Level of assistance: SBA Comments: 2 MWT 259f. Normal for age 5429feet Pt with R antalgic gait with lateral trunk lean to left.  Decreased trunk rotation  Decreased stance time on R    2.13 ft /sec gait velocity    BERG BALANCE   Sitting to Standing: Numbers; 0-4: 2            4. Stands without using hands and stabilize independently            3. Stands independently using hands            2. Stands using hands after multiple trials            1. Min A to stand            0. Mod-Max A to stand Standing unsupported: Numbers; 0-4: 4            4. Stands safely for 2 minutes            3. Stands 2 minutes with supervision            2. Stands 30 seconds unsupported            1. Needs several tries to stand unsupported for 30 seconds            0. Unable to stand unsupported for 30 seconds Sitting unsupported: Numbers; 0-4: 4            4. Sits for 2 minutes independently            3. Sits for 2 minutes with supervision            2. Able to sit 30 seconds            1. Able to sit 10 seconds            0. Unable to sit for 10 seconds Standing to Sitting: Numbers; 0-4: 3 4. Sits safely with minimal use of hands 3. Controls descent with hands 2. Uses back of legs against chair to control descent 1. Sits independently, but uncontrolled descent 0. Needs assistance Transfers: Numbers; 0-4: 3            4. Transfers safely with minor use of hands            3. Transfers safely definite use of hands            2.  Transfers with verbal cueing/supervision            1. Needs 1 person assist            0. Needs 2 person assist  Standing with eyes closed: Numbers; 0-4: 3            4. Stands safely for 10 seconds            3. Stands 10 seconds with supervision             2. Able to stand for 3 seconds            1. Unable to keep eyes closed for 3 seconds, but is safe  0. Needs assist to keep from falling Standing with feet together: Numbers; 0-4: 2 4. Stands for 1 minute safely 3. Stands for 1 minute with supervision 2. Unable to hold for 30 seconds            1. Needs help to attain position but can hold for 15 seconds            0. Needs help to attain position and unable to hold for 15 seconds Reaching forward with outstretched arm: Numbers; 0-4: 3            4. Reaches forward 10 inches            3. Reaches forward 5 inches            2. Reaches forward 2 inches            1. Reaches forward with supervision            0. Loses balance/requires assistace Retrieving object from the floor: Numbers; 0-4: 2 4. Able to pick up easily and safely 3. Able to pick up with supervision 2. Unable to pick up, but reaches within 1-2 inches independently 1. Unable to pick up and needs supervision 0. Unable/needs assistance to keep from falling  Turning to look behind: Numbers; 0-4: 2            4. Looks behind from both sides and weight shifts well            3. Looks behind one side only, other side less weight shift            2. Turns sideways only, maintains balance            1. Needs supervision when turning            0. Needs assistance  Turning 360 degrees: Numbers; 0-4: 2            4. Able to turn in </=4 seconds            3. Able to turn on one side in </= 4 seconds             2. Able to turn slowly, but safely            1. Needs supervision or verbal cueing            0. Needs assistance Place alternate foot on stool: Numbers; 0-4: 2 4. Completes 8 steps in 20  seconds 3. Completes 8 steps in >20 seconds 2. 4 steps without assistance/supervision 1. Completes >2 steps with minimal assist 0. Unable, needs assist to keep from falling Standing with one foot in front: Numbers; 0-4: 2            4. Independent tandem for 30 seconds            3. Independent foot ahead for 30 seconds            2. Independent small step for 30 seconds            1. Needs help to step, but can hold for 15 seconds            0. Loses balance while standing/stepping Standing on one foot: Numbers; 0-4: 1 4. Holds >10 seconds 3. Holds 5-10 seconds 2. Holds >/=3 seconds  1. Holds <3 seconds 0. Unable    Total Score: 35/56  Pt recommended to use walker at all times for now due to  BERG test   TODAY'S TREATMENT  [facility] Adult PT Treatment:                                                DATE: 05-31-22 Aquatic therapy at [address] - therapeutic pool temp 92 degrees Pt enters building with cane. Treatment took place in water 3.8 to  4 ft 8 in.feet deep depending upon activity.  Pt utilizes SPC at all times for gait. Pt entered and exited the pool via stair and handrails independently.    Walking forward/sidestepping using aqua jogger barbell for balance.  Reviewed  Ai Chi  HEP  from last admission utilizing laminated sheet with demo and encouraged  deep breathing and pain control using slow controlled Tai chi like movements with shoulders submerged. Emphasis on shoulder movement  and LE and abdominal engagement.    Ai Chi  postures of contemplating to floating then uplifting to enclosing,  Folding, soothing gathering and ending with freeing motion.  Pt also working on balance with All 8-10 x each - cues given to keep core tight for improved trunk stabilization; Utilization of  hip ER and IR dynamic with Shifting, accepting, accepting with Shirlee [place] rounding, Balancing  flowing, reflecting  and suspending. posture 8-10 reps each with pt maintaining balance with  turbulence in water. Ending time with karioke stepping for 4 lengths of pool   Hip abd/add BIL Hip ext/ knee flex with knee straight  Hip IR/ER with hip flex 90 degrees PPT against pool wall  With rainbow DB, UE shld abduction. Horizontal abd/add, bicep/triceps x 15 each Triangle pose Gentle lumbar motion with pool noodle Figure 4 squat stretch x30" BIL Runners stretch x30" BIL Hamstring stretch x30" BIL Squats 2 x 20 reps STS on 3rd step x 10 with UE support on handrails Using Red ball Adduction squeeze x 15 Walking up and down submerged steps with Handrails up 4 and down 4 for 3 rounds SLS  with unweighted leg flexion and then extension while maintaining balance and turbulence in water on R and then L  Pt requires the buoyancy of water for active assisted exercises with buoyancy supported for strengthening and AROM exercises: PT  requires the viscosity of the water for resistance with strengthening exercises as well as balance. Hydrostatic pressure also supports joints by unweighting joint load by at least 50 % in 3-4 feet depth water. 80% in chest to neck deep water. Water will allow for  reduced joint loading through buoyancy to help patient improve posture without excess stress and pain.Aquatic Exercise  [facility] Adult PT Treatment:                                                DATE: 05/29/22 Therapeutic Exercise: Nustep L5 UE/ LE x 6 min Knee extension OMEGA 10 # bilateral 10 x 2  Knee Flexion OMEGA 25# 10 x 2  SLS 8 sec Right, 12 sec Left  15 sec tandem stance Forward lunge with 1 UE on freemotion bar x 5 each Lunge to airex pad using bilateral chair support x 5 each  STS 25# Squat to chair tap x 10- elevated with airex Bridge x 10 PPT x 10 Supine March with ab brace  Table top (up/down one Leg at a time) x 5    [facility] Adult PT Treatment:                                                DATE: 05-25-22 Therapeutic Exercise: All exercise encouraging PPT to relieve pain and  pressure  Sitting  Posterior Pelvic Tilt  2 x 10 5 sec hold to decrease back tension Clamshell with Resistance  RTB 2 x 10 VC for PPT with exercise to relieve back tension Sit to Stand  2 x 10 Standing Marching RTB 3 x 10 bil Seated Knee Extension with RTB 3 x 10 bil  Standing Hamstring Curl with  RTB 3 x 10 bil Standing Hip Abduction with RTB 3 x 10 bil Standing Hip Extension with RTB 3 x 10 bil Marching with Resistance  RTB 3 x 10       [facility] Adult PT Treatment:                                                DATE: 05-23-22 05-23-22 5 x STS  20.06 sec Therapeutic Exercise: Recumbent Bike 5 min level 4 RPE 5/6 March with RTB resistance 2 x 10 on R and L SLS with hip extension with KB touch on R and L 2 x 10 with UE support Squats from chair with added there ex cushion  3 x 5 resp with 25 lb KB Ham curls with RTB 1 x 10 LAQ 2 x 10 bilateral  with GTB for resistance Standing hip extension 2 x 10 R and L each with RTB Standing hip abduction 2 x 10  R and L each with RTB  Neuromuscular re-ed: Single limb KB touch with L UE on counter. 2 x 10 Single limb KB touch with  R UE on counter. 2 x 10 Toe touch SLS on R and then left 1 min each with no UE support SLS on there ex pad R and then left 1 min each several attempts Semi-tandem stance on there ex pad x30" BIL    [facility] Adult PT Treatment:                                                DATE: 05-16-22  05-16-22 6MWT  927 feet ( normal 1635-1980 ft) Therapeutic Exercise: Nustep level 5 x 5 mins Sink squats 3 x 10 with UE support then  did 6 MWT Childs pose forward and side to side Supine posterior pelvic tilt 5" hold 2 x 10 VC to bend knees Bridges x10  SLR partial range 2 x 10 bilateral  Sidelying clamshell 1 x 10 bilateral green band  LAQ 2 x 10 bilateral  with GTB for resistance Standing march 2 x 10 Standing hip abduction 2 x 10 bilateral   Neuromuscular re-ed: Romberg stance 2x30" 1 set with head turns SLS on R and L  practicing for 2 minutes no more than 10 sec at longest Semi-tandem stance x30" BIL   [facility] Adult PT Treatment:  DATE: 05/10/2022 Therapeutic Exercise: Nustep level 5 x 5 mins Supine posterior pelvic tilt 5" hold 2 x 10 Bridges x10 (hamstring cramp) SLR partial range 2 x 10 bilateral  Sidelying clamshell 1 x 10 bilateral green band  Sit to stand arms crossed 1 x 10  LAQ 2 x 10 bilateral  Standing march 2 x 10 Standing hip abduction 2 x 10 bilateral  Neuromuscular re-ed: Romberg stance 2x30" 1 set with head turns Semi-tandem stance x30" BIL   [facility] Adult PT Treatment:                                                DATE: 05/03/22 Therapeutic Exercise: Supine posterior pelvic tilt 1 x 10 SLR partial range 1 x 10 bilateral  Clamshell 1 x 10 bilateral green band  Sit to stand raised height 1 x 10  LAQ 1 x 10 bilateral  Standing march 1 x 10 Standing hip abduction 1 x 10 bilateral      PATIENT EDUCATION:  Education details: HEP Person educated: Patient Education method: Musician, cues, and Handouts Education comprehension: verbalized understanding,cues, returned demo, further instruction required      HOME EXERCISE PROGRAM: Access Code: [email or web address] URL: [email or web address] Date: 05/25/2022 Prepared by: Voncille Lo  Exercises - Supine Posterior Pelvic Tilt  - 1 x daily - 7 x weekly - 2 sets - 10 reps - Clamshell with Resistance  - 1 x daily - 7 x weekly - 2 sets - 10 reps - Sit to Stand  - 1 x daily - 7 x weekly - 2 sets - 10 reps - Standing Marching  - 1 x daily - 7 x weekly - 2 sets - 10 reps - Seated Knee Extension with Resistance  - 1 x daily - 7 x weekly - 3 sets - 10 reps - Standing Hamstring Curl with Resistance  - 1 x daily - 7 x weekly - 3 sets - 10 reps - Hip Abduction with Resistance Loop  - 1 x daily - 7 x weekly - 3 sets - 10 reps - Standing Hip Extension with Resistance at Ankles and Counter  Support  - 1 x daily - 7 x weekly - 3 sets - 10 reps - Marching with Resistance  - 1 x daily - 7 x weekly - 3 sets - 10 reps    ASSESSMENT:   CLINICAL IMPRESSION:  Pt enters aquatic pool for first visit this admission/PT.  Pt has experience in the water but also is looking to join local [employer] post [facility] to continue aquatic water exercise.  Pt is able to work on strength and balance in the water.  Re educated pt on [place] sequence for deep breathing and full body AROM and movement.  Pt will benefit from using aquatic equipment to increase challenge and strengthen.  Pt generally 4/10 at beginning of RX.  And ends with 3/10  Pt able to participate in 45 min of continuous exercise   EVAL- Patient presents  to clinic utilizing [facility] that needed adjustment and  CC of back pain and  L foot pain ( bone spur post injection) especially after traveling to [facility] to retrieve pregnant homeless daughter and her back is in spasm. She returned at 4:00 AM this morning. She has not had time to  exercise but knows she needs to prioritize her own health  as she will soon be caring for dtr and new grandbaby due date July 19. Session today focused  endurance with 6 MWT and on core, proximal hip and LE strengthening with balance.    Patient continues to benefit from skilled PT services and should be progressed as able to improve functional independence.     OBJECTIVE IMPAIRMENTS decreased activity tolerance, decreased balance, decreased mobility, difficulty walking, decreased ROM, decreased strength, improper body mechanics, postural dysfunction, obesity, and pain.    ACTIVITY LIMITATIONS carrying, bending, sitting, standing, squatting, sleeping, stairs, transfers, dressing, and locomotion level   PARTICIPATION LIMITATIONS: driving, shopping, and exercising and    PERSONAL FACTORS L TKA 01-26-21, DDD, cardiac cath 2009, Sciatica, RTC syndrome, plantar fasciitis, carpal tunnel, OA, obesity,  See medical hx are also affecting  patient's functional outcome.    REHAB POTENTIAL: Fair Pt currently has aide to care for needs at home domestic chores and some cooking   CLINICAL DECISION MAKING: Evolving/moderate complexity   EVALUATION COMPLEXITY: Moderate     GOALS: Goals reviewed with patient? Yes   SHORT TERM GOALS: Target date: 05/23/2022   Pt independent with initial HEP Baseline:Pt needs to reinforce HEP and consistency of exercise Goal status: MET   2.  Pt will perform 6 minutes walk test.  Baseline: only able to tolerate 2 minute walk test 278 ft (579 normal)  Pt unable to stand for more than 5-10 min due to pain  05-16-22 6MWT  927 feet ( normal 1635-1980 ft) Goal status: I NOT MET by goal   3.  Pt will return to aquatic therapy to update HEP for use on her own at local pool Baseline: Pt currently with no routine exercise program due to pain, Pt has not been to aquatics yet 05-16-22.  Beginning aquatic therapy 05-31-22 Goal status: MET   4.  Pt will improve SLS to at least 15 sec on R and L to show improved balance Baseline: unable to tolerate more than 3 sec SLS 05-25-22 less than 5 sec each Status 05/29/22: 8-12 sec  Goal status: NOT MET by goal date        LONG TERM GOALS: Target date: 06/20/2022   Pt will be independent with advanced HEP on land in order to maintain strength Baseline: Pt currently not doing any routine exercise  05-25-22 incorporating some exercise every day Goal status: ONGOING   2.  Return to walking/standing for 25 minutes without stopping using LRAD in order to shop Baseline: Currently not doing any exercise and cannot stand or walk greater than 5-10 minutes and must use riding grocery cart 05-25-22  walking around and caring for baby grand daughter just born Goal status: ONGOING   3.  Pt will use pain management in order to sleep for 4 or more hour of uninterrupted sleep Baseline: pt awakens every 2 hours due to pain and wakes whenever she turns in bed 05-23-22 Pt reports 3-4  hours of sleep a night Goal status: ONGOING   4.  Pt will be able to negotiate steps without exacerbation of pain greater than 3/10 Baseline: 7/10 currently in eval but can rise to 10/10 Goal status: ONGOING   5.  BERG balance with increase at least 10 points to 45/56 to show decreased risk of fall with LRAD Baseline: eval 35/56 using cane but told to utlize walker at all times Goal status: ONGOING   6.  ODI will improve  to at least 30/50 to show improvement in functional mobility Baseline: 40/50  80% disabilty Goal status: ONGOING   7. Pt will demonstrate floor to stand transfer safely for falls preparedness and decrease risk of fall            Baseline: pt unable to transfer floor to stand and has had 3 falls in last 6 months.            Goal status ONGOING   8. Pt will incorporate regular land based exercise in order to maintain strength for ADL/household tasks            Baseline:  Pt unable to do any regular exercise due to pain and weakness.            Goal status ONGOING     PLAN: PT FREQUENCY: 2x/week   PT DURATION: 8 weeks   PLANNED INTERVENTIONS: Therapeutic exercises, Therapeutic activity, Neuromuscular re-education, Balance training, Gait training, Patient/Family education, and Joint mobilization.   PLAN FOR NEXT SESSION: progress strength and balance as tolerated. Review HEP.   Add resisted knee flex and ext.  ADD lunges and prepare for floor transfer etc check BERG  by Goal 9/10 visit  Voncille Lo, PT, [facility] Certified Exercise Expert for the Aging Adult  05/31/22 4:46 PM Phone: [phone] Fax: [phone]

## 2022-05-31 ENCOUNTER — Encounter: Payer: Self-pay | Admitting: Physical Therapy

## 2022-05-31 ENCOUNTER — Ambulatory Visit: Payer: Medicaid Other | Admitting: Physical Therapy

## 2022-05-31 DIAGNOSIS — R262 Difficulty in walking, not elsewhere classified: Secondary | ICD-10-CM

## 2022-05-31 DIAGNOSIS — R296 Repeated falls: Secondary | ICD-10-CM | POA: Diagnosis not present

## 2022-05-31 DIAGNOSIS — R252 Cramp and spasm: Secondary | ICD-10-CM | POA: Diagnosis not present

## 2022-05-31 DIAGNOSIS — M6281 Muscle weakness (generalized): Secondary | ICD-10-CM

## 2022-05-31 DIAGNOSIS — R2681 Unsteadiness on feet: Secondary | ICD-10-CM

## 2022-05-31 DIAGNOSIS — M25562 Pain in left knee: Secondary | ICD-10-CM

## 2022-05-31 DIAGNOSIS — G8929 Other chronic pain: Secondary | ICD-10-CM | POA: Diagnosis not present

## 2022-05-31 DIAGNOSIS — M542 Cervicalgia: Secondary | ICD-10-CM

## 2022-05-31 DIAGNOSIS — M25662 Stiffness of left knee, not elsewhere classified: Secondary | ICD-10-CM

## 2022-05-31 DIAGNOSIS — M1712 Unilateral primary osteoarthritis, left knee: Secondary | ICD-10-CM | POA: Diagnosis not present

## 2022-05-31 DIAGNOSIS — M5440 Lumbago with sciatica, unspecified side: Secondary | ICD-10-CM | POA: Diagnosis not present

## 2022-06-01 DIAGNOSIS — M1712 Unilateral primary osteoarthritis, left knee: Secondary | ICD-10-CM | POA: Diagnosis not present

## 2022-06-02 DIAGNOSIS — M1712 Unilateral primary osteoarthritis, left knee: Secondary | ICD-10-CM | POA: Diagnosis not present

## 2022-06-03 DIAGNOSIS — M1712 Unilateral primary osteoarthritis, left knee: Secondary | ICD-10-CM | POA: Diagnosis not present

## 2022-06-04 DIAGNOSIS — M1712 Unilateral primary osteoarthritis, left knee: Secondary | ICD-10-CM | POA: Diagnosis not present

## 2022-06-05 ENCOUNTER — Ambulatory Visit: Payer: Medicaid Other | Admitting: Physical Therapy

## 2022-06-05 DIAGNOSIS — M1712 Unilateral primary osteoarthritis, left knee: Secondary | ICD-10-CM | POA: Diagnosis not present

## 2022-06-05 NOTE — Therapy (Addendum)
OUTPATIENT PHYSICAL THERAPY TREATMENT NOTE  PHYSICAL THERAPY DISCHARGE SUMMARY  Visits from Start of Care: 9  Current functional level related to goals / functional outcomes: Unknown,  Pt was at ED for chronic sciatica exacerbation.  Pt was recommended to return to PCP for follow-up   Remaining deficits: Chronic R Sciatica   Education / Equipment: HEP   Patient agrees to discharge. Patient goals were not met. Patient is being discharged due to a change in medical status.  Went to ED and was referred back to PCP  has not been seen since going to ED on 06-19-22  Patient Name: Katie [name] MRN: [id number] DOB:[birth year], 56 y.o., female Today's Date: 06/07/2022  PCP: Elwyn Reach, MD REFERRING PROVIDER: Elwyn Reach, MD  END OF SESSION:   PT End of Session - 06/07/22 1421     Visit Number 9    Number of Visits 16    Date for PT Re-Evaluation 06/20/22    Authorization Type [employer] NO tx, estim unattended , [facility] [place]    Authorization Time Period 05/25/22-06/23/22    Authorization - Visit Number 3    Authorization - Number of Visits 4    Activity Tolerance Patient tolerated treatment well    Behavior During Therapy [facility] for tasks assessed/performed                   Past Medical History:  Diagnosis Date   Anxiety    Back pain    Bipolar 1 disorder ([place])    Chronic pain entered 08/15/2015   Treated with Oxycodone   Depression    Depression    Dyspnea    History of acute bronchitis    HTN (hypertension)    OA (osteoarthritis)    Obesity    PE (pulmonary embolism)    oct 2014   Sciatica    Past Surgical History:  Procedure Laterality Date   ADENOIDECTOMY     BIOPSY STOMACH     CARDIAC CATHETERIZATION  2009   normal; in [place]  2002   x 2, 1997 and 2002   CHOLECYSTECTOMY  2007   KNEE SURGERY Right 2005   TONSILLECTOMY     TOTAL KNEE ARTHROPLASTY Left 01/27/2020   Procedure: TOTAL KNEE ARTHROPLASTY;  Surgeon:  Renette Butters, MD;  Location: [facility];  Service: Orthopedics;  Laterality: Left;   TYMPANOSTOMY TUBE PLACEMENT     Patient Active Problem List   Diagnosis Date Noted   Allergic reaction caused by a drug 07/26/2021   Primary osteoarthritis of left knee 12/29/2019   Morbid obesity ([place])    Bradycardia    Pulmonary embolism on long-term anticoagulation therapy ([place]) 04/06/2016   Pain in the chest    Chronic pain    Chest wall pain    Pulmonary embolism ([place]) 07/31/2013   Vocal cord dysfunction 02/28/2012   Dyspnea 01/17/2012   Chest pain 11/25/2011   Hypokalemia 11/25/2011   GERD (gastroesophageal reflux disease) 03/08/2011   DYSMENORRHEA 11/10/2010   PLANTAR FASCIITIS 09/14/2010   OSTEOARTHRITIS, KNEES, BILATERAL, SEVERE 06/22/2010   ROTATOR CUFF SYNDROME, RIGHT 04/11/2010   VITAMIN D DEFICIENCY 02/18/2010   Bipolar 1 disorder ([place]) 01/17/2010   Essential hypertension, benign 01/17/2010   INSOMNIA 01/17/2010   OBESITY, NOS 01/03/2007   Depression 01/03/2007   HEARING LOSS NOS OR DEAFNESS 01/03/2007   EDEMA-LEGS,DUE TO VENOUS OBSTRUCT. 01/03/2007   Irritable bowel syndrome 01/03/2007    REFERRING DIAG: Lumbar radicular  pain & spondylosis  THERAPY DIAG:  Chronic right-sided low back pain with sciatica, sciatica laterality unspecified  Stiffness of left knee, not elsewhere classified  Difficulty in walking, not elsewhere classified  Muscle weakness (generalized)  Unsteadiness on feet  Repeated falls  Rationale for Evaluation and Treatment Rehabilitation  PERTINENT HISTORY: L TKA 01-26-21, DDD, cardiac cath 2009, Sciatica, RTC syndrome, plantar fasciitis, carpal tunnel, OA, obesisty,  Se medical hx  PRECAUTIONS: Other: ALERGIC to percocet, hydrocodone, Vicodin, Aleve  SUBJECTIVE:  06-07-22  4 overall but left knee is 6/10.  I was sick at my stomach on Monday but I am feeling better now. PAIN:  Are you having pain? Yes: NPRS scale: 5/10 back, Left knee 6/10 and R  4/10   Pain location: back and kees Pain description: swollen Aggravating factors: movement Relieving factors: rest   OBJECTIVE: (objective measures completed at initial evaluation unless otherwise dated)   OBJECTIVE:    DIAGNOSTIC FINDINGS:  No recent Lumbar images IMPRESSION: 01-20-22 Cervical Minor degenerative changes as detailed above without significant stenosis.  COMPARISON:  June 2022   FINDINGS: Alignment: No significant listhesis.   Vertebrae: Cervical vertebral body heights are maintained. No marrow edema. No suspicious osseous lesion.   Cord: No abnormal signal.   Posterior Fossa, vertebral arteries, paraspinal tissues: Unremarkable.   PATIENT SURVEYS:  Modified Oswestry 40/50   80% disability    SCREENING FOR RED FLAGS: Bowel or bladder incontinence: No Cauda equina syndrome: No   COGNITION:           Overall cognitive status: Within functional limits for tasks assessed                          SENSATION: Pt complains of numbness into entire hand of R hand    MUSCLE LENGTH: Hamstrings:  bil tight ness approximaltely 20 degrees     POSTURE: rounded shoulders, forward head, and Pt has had weight loss of 90 lbs in last 4 years   PALPATION:  Lumbar paraspinal tightness.  R elevated pelvis. TTP over R gluteals and R QL Grip strength R average 40 lb and L average 59 lb  R hand dominant LUMBAR ROM:    Active  A/PROM  eval  Flexion Fingers to ankles but pain on rising upright  Extension 15  with pain on R   Right lateral flexion Fingertip to 1 inch above knee jt    Left lateral flexion Finger tip to 2 in above knee jt  Right rotation 50% pain to pulling from shld ot back  Left rotation 50%   (Blank rows = not tested)   LOWER EXTREMITY ROM:      Active  Right eval Left eval  Hip flexion 60 80  Hip extension      Hip abduction      Hip adduction      Hip internal rotation      Hip external rotation      Knee flexion 120 118  Knee extension  -5 -5  Ankle dorsiflexion      Ankle plantarflexion      Ankle inversion      Ankle eversion       (Blank rows = not tested)   LOWER EXTREMITY MMT:   Pain with R LE movment    MMT Right eval Left eval  Hip flexion 3- 4-  Hip extension 3- 4-  Hip abduction 3- 3+  Hip adduction  Hip internal rotation      Hip external rotation      Knee flexion 4- 4+  Knee extension 4- 4  Ankle dorsiflexion 3+ 4  Ankle plantarflexion 3/25 3/25  Ankle inversion      Ankle eversion       (Blank rows = not tested)   LUMBAR SPECIAL TESTS:  Straight leg raise test: Positive, Slump test: Negative, and FABER test: Positive Right side pain with movement    FUNCTIONAL TESTS:  5 times sit to stand: 41.08 sec 05-23-22 5 x STS  20.06 sec 2 minute walk test: 278 ft Berg Balance Scale: 35/56  must use walker at all times for safety   05-16-22 6MWT  927 feet ( normal 1635-1980 ft)   GAIT: Distance walked: 280 feet Assistive device utilized: Single point cane Level of assistance: SBA Comments: 2 MWT 253ft. Normal for age 14 feet Pt with R antalgic gait with lateral trunk lean to left.  Decreased trunk rotation  Decreased stance time on R    2.13 ft /sec gait velocity    BERG BALANCE   Sitting to Standing: Numbers; 0-4: 2            4. Stands without using hands and stabilize independently            3. Stands independently using hands            2. Stands using hands after multiple trials            1. Min A to stand            0. Mod-Max A to stand Standing unsupported: Numbers; 0-4: 4            4. Stands safely for 2 minutes            3. Stands 2 minutes with supervision            2. Stands 30 seconds unsupported            1. Needs several tries to stand unsupported for 30 seconds            0. Unable to stand unsupported for 30 seconds Sitting unsupported: Numbers; 0-4: 4            4. Sits for 2 minutes independently            3. Sits for 2 minutes with supervision             2. Able to sit 30 seconds            1. Able to sit 10 seconds            0. Unable to sit for 10 seconds Standing to Sitting: Numbers; 0-4: 3 4. Sits safely with minimal use of hands 3. Controls descent with hands 2. Uses back of legs against chair to control descent 1. Sits independently, but uncontrolled descent 0. Needs assistance Transfers: Numbers; 0-4: 3            4. Transfers safely with minor use of hands            3. Transfers safely definite use of hands            2. Transfers with verbal cueing/supervision            1. Needs 1 person assist            0. Needs 2 person assist  Standing with  eyes closed: Numbers; 0-4: 3            4. Stands safely for 10 seconds            3. Stands 10 seconds with supervision             2. Able to stand for 3 seconds            1. Unable to keep eyes closed for 3 seconds, but is safe            0. Needs assist to keep from falling Standing with feet together: Numbers; 0-4: 2 4. Stands for 1 minute safely 3. Stands for 1 minute with supervision 2. Unable to hold for 30 seconds            1. Needs help to attain position but can hold for 15 seconds            0. Needs help to attain position and unable to hold for 15 seconds Reaching forward with outstretched arm: Numbers; 0-4: 3            4. Reaches forward 10 inches            3. Reaches forward 5 inches            2. Reaches forward 2 inches            1. Reaches forward with supervision            0. Loses balance/requires assistace Retrieving object from the floor: Numbers; 0-4: 2 4. Able to pick up easily and safely 3. Able to pick up with supervision 2. Unable to pick up, but reaches within 1-2 inches independently 1. Unable to pick up and needs supervision 0. Unable/needs assistance to keep from falling  Turning to look behind: Numbers; 0-4: 2            4. Looks behind from both sides and weight shifts well            3. Looks behind one side only, other side less  weight shift            2. Turns sideways only, maintains balance            1. Needs supervision when turning            0. Needs assistance  Turning 360 degrees: Numbers; 0-4: 2            4. Able to turn in </=4 seconds            3. Able to turn on one side in </= 4 seconds             2. Able to turn slowly, but safely            1. Needs supervision or verbal cueing            0. Needs assistance Place alternate foot on stool: Numbers; 0-4: 2 4. Completes 8 steps in 20 seconds 3. Completes 8 steps in >20 seconds 2. 4 steps without assistance/supervision 1. Completes >2 steps with minimal assist 0. Unable, needs assist to keep from falling Standing with one foot in front: Numbers; 0-4: 2            4. Independent tandem for 30 seconds            3. Independent foot ahead for 30 seconds  2. Independent small step for 30 seconds            1. Needs help to step, but can hold for 15 seconds            0. Loses balance while standing/stepping Standing on one foot: Numbers; 0-4: 1 4. Holds >10 seconds 3. Holds 5-10 seconds 2. Holds >/=3 seconds  1. Holds <3 seconds 0. Unable    Total Score: 35/56  Pt recommended to use walker at all times for now due to BERG test   TODAY'S TREATMENT   [facility] Adult PT Treatment:                                                DATE: 06-07-22 Aquatic therapy at [address] - therapeutic pool temp 92 degrees Pt enters building with cane. Treatment took place in water 3.8 to  4 ft 8 in.feet deep depending upon activity.  Pt utilizes SPC at all times for gait. Pt entered and exited the pool via stair and handrails independently.     Yellow aquatic DB with DB submerged for shld flex /ext  x 20 Yellow aquatic DB with DB submerged for horizontal abd/add x 20 Yellow aquatic DB with DB submerged for shld abd/add x 20 Yellow aquatic DB with triceps press 2 x 10 Gentle lumbar rotation with Yellow DB floating  LE exercises using  Yellow DB for UE support and to work on balance in water with noncompliant surface Hip abd/add BIL Hip ext/ knee flex with knee straight  Hip IR/ER with hip flex 90 degrees PPT against pool wall  Step ups with aquatic cuffs without UE support 10 x on R and L each Figure 4 squat stretch x30" BIL Runners stretch x30" BIL Hamstring stretch x30" BIL Squats 2 x 20 reps Treading water for 2 min without break Supine abdominal hollowing with VC to keep hips up and feet up with  yellow barbells in UE submerged Side to side LE extension  side to side to engage abdominal external and internal obliques with jump tucks In deep water using yellow aquatic DB  bicycle, then jumping jacks and then skier until fatigue, and rocking horse  Sitting on submerged bench.  LAQ Bicycle  Flutter kick Sitting march  Pt requires the buoyancy of water for active assisted exercises with buoyancy supported for strengthening and AROM exercises: PT  requires the viscosity of the water for resistance with strengthening exercises as well as balance. Hydrostatic pressure also supports joints by unweighting joint load by at least 50 % in 3-4 feet depth water. 80% in chest to neck deep water. Water will allow for  reduced joint loading through buoyancy to help patient improve posture without excess stress and pain.Aquatic Exercise  [facility] Adult PT Treatment:                                                DATE: 05-31-22 Aquatic therapy at [address] - therapeutic pool temp 92 degrees Pt enters building with cane. Treatment took place in water 3.8 to  4 ft 8 in.feet deep depending upon activity.  Pt utilizes SPC at all times for gait. Pt entered and exited the  pool via stair and handrails independently.    Walking forward/sidestepping using aqua jogger barbell for balance.  Reviewed  Ai Chi  HEP  from last admission utilizing laminated sheet with demo and encouraged  deep breathing and pain control using slow  controlled Tai chi like movements with shoulders submerged. Emphasis on shoulder movement  and LE and abdominal engagement.    Ai Chi  postures of contemplating to floating then uplifting to enclosing,  Folding, soothing gathering and ending with freeing motion.  Pt also working on balance with All 8-10 x each - cues given to keep core tight for improved trunk stabilization; Utilization of  hip ER and IR dynamic with Shifting, accepting, accepting with Shirlee L[place]ick rounding, Balancing  flowing, reflecting  and suspending. posture 8-10 reps each with pt maintaining balance with turbulence in water. Ending time with karioke stepping for 4 lengths of pool   Hip abd/add BIL Hip ext/ knee flex with knee straight  Hip IR/ER with hip flex 90 degrees PPT against pool wall  With rainbow DB, UE shld abduction. Horizontal abd/add, bicep/triceps x 15 each Triangle pose Gentle lumbar motion with pool noodle Figure 4 squat stretch x30" BIL Runners stretch x30" BIL Hamstring stretch x30" BIL Squats 2 x 20 reps STS on 3rd step x 10 with UE support on handrails Using Red ball Adduction squeeze x 15 Walking up and down submerged steps with Handrails up 4 and down 4 for 3 rounds SLS  with unweighted leg flexion and then extension while maintaining balance and turbulence in water on R and then L  Pt requires the buoyancy of water for active assisted exercises with buoyancy supported for strengthening and AROM exercises: PT  requires the viscosity of the water for resistance with strengthening exercises as well as balance. Hydrostatic pressure also supports joints by unweighting joint load by at least 50 % in 3-4 feet depth water. 80% in chest to neck deep water. Water will allow for  reduced joint loading through buoyancy to help patient improve posture without excess stress and pain.Aquatic Exercise  [facility] Adult PT Treatment:                                                DATE: 05/29/22 Therapeutic Exercise: Nustep L5  UE/ LE x 6 min Knee extension OMEGA 10 # bilateral 10 x 2  Knee Flexion OMEGA 25# 10 x 2  SLS 8 sec Right, 12 sec Left  15 sec tandem stance Forward lunge with 1 UE on freemotion bar x 5 each Lunge to airex pad using bilateral chair support x 5 each  STS 25# Squat to chair tap x 10- elevated with airex Bridge x 10 PPT x 10 Supine March with ab brace  Table top (up/down one Leg at a time) x 5    [facility] Adult PT Treatment:                                                DATE: 05-25-22 Therapeutic Exercise: All exercise encouraging PPT to relieve pain and pressure  Sitting  Posterior Pelvic Tilt  2 x 10 5 sec hold to decrease back tension Clamshell with Resistance  RTB 2 x 10 VC for PPT with  exercise to relieve back tension Sit to Stand  2 x 10 Standing Marching RTB 3 x 10 bil Seated Knee Extension with RTB 3 x 10 bil  Standing Hamstring Curl with  RTB 3 x 10 bil Standing Hip Abduction with RTB 3 x 10 bil Standing Hip Extension with RTB 3 x 10 bil Marching with Resistance  RTB 3 x 10       [facility] Adult PT Treatment:                                                DATE: 05-23-22 05-23-22 5 x STS  20.06 sec Therapeutic Exercise: Recumbent Bike 5 min level 4 RPE 5/6 March with RTB resistance 2 x 10 on R and L SLS with hip extension with KB touch on R and L 2 x 10 with UE support Squats from chair with added there ex cushion  3 x 5 resp with 25 lb KB Ham curls with RTB 1 x 10 LAQ 2 x 10 bilateral  with GTB for resistance Standing hip extension 2 x 10 R and L each with RTB Standing hip abduction 2 x 10  R and L each with RTB  Neuromuscular re-ed: Single limb KB touch with L UE on counter. 2 x 10 Single limb KB touch with  R UE on counter. 2 x 10 Toe touch SLS on R and then left 1 min each with no UE support SLS on there ex pad R and then left 1 min each several attempts Semi-tandem stance on there ex pad x30" BIL    [facility] Adult PT Treatment:                                                 DATE: 05-16-22  05-16-22 6MWT  927 feet ( normal 1635-1980 ft) Therapeutic Exercise: Nustep level 5 x 5 mins Sink squats 3 x 10 with UE support then  did 6 MWT Childs pose forward and side to side Supine posterior pelvic tilt 5" hold 2 x 10 VC to bend knees Bridges x10  SLR partial range 2 x 10 bilateral  Sidelying clamshell 1 x 10 bilateral green band  LAQ 2 x 10 bilateral  with GTB for resistance Standing march 2 x 10 Standing hip abduction 2 x 10 bilateral   Neuromuscular re-ed: Romberg stance 2x30" 1 set with head turns SLS on R and L practicing for 2 minutes no more than 10 sec at longest Semi-tandem stance x30" BIL   [facility] Adult PT Treatment:                                                DATE: 05/10/2022 Therapeutic Exercise: Nustep level 5 x 5 mins Supine posterior pelvic tilt 5" hold 2 x 10 Bridges x10 (hamstring cramp) SLR partial range 2 x 10 bilateral  Sidelying clamshell 1 x 10 bilateral green band  Sit to stand arms crossed 1 x 10  LAQ 2 x 10 bilateral  Standing march 2 x 10 Standing hip abduction 2 x 10 bilateral  Neuromuscular re-ed: Romberg stance  2x30" 1 set with head turns Semi-tandem stance x30" BIL   [facility] Adult PT Treatment:                                                DATE: 05/03/22 Therapeutic Exercise: Supine posterior pelvic tilt 1 x 10 SLR partial range 1 x 10 bilateral  Clamshell 1 x 10 bilateral green band  Sit to stand raised height 1 x 10  LAQ 1 x 10 bilateral  Standing march 1 x 10 Standing hip abduction 1 x 10 bilateral      PATIENT EDUCATION:  Education details: HEP Person educated: Patient Education method: Musician, cues, and Handouts Education comprehension: verbalized understanding,cues, returned demo, further instruction required      HOME EXERCISE PROGRAM: Access Code: [email or web address] URL: [email or web address] Date: 05/25/2022 Prepared by: Voncille Lo  Exercises - Supine Posterior Pelvic Tilt   - 1 x daily - 7 x weekly - 2 sets - 10 reps - Clamshell with Resistance  - 1 x daily - 7 x weekly - 2 sets - 10 reps - Sit to Stand  - 1 x daily - 7 x weekly - 2 sets - 10 reps - Standing Marching  - 1 x daily - 7 x weekly - 2 sets - 10 reps - Seated Knee Extension with Resistance  - 1 x daily - 7 x weekly - 3 sets - 10 reps - Standing Hamstring Curl with Resistance  - 1 x daily - 7 x weekly - 3 sets - 10 reps - Hip Abduction with Resistance Loop  - 1 x daily - 7 x weekly - 3 sets - 10 reps - Standing Hip Extension with Resistance at Ankles and Counter Support  - 1 x daily - 7 x weekly - 3 sets - 10 reps - Marching with Resistance  - 1 x daily - 7 x weekly - 3 sets - 10 reps    ASSESSMENT:   CLINICAL IMPRESSION:  Pt enters aquatic pool  with 4/10 generally and 6/10 on L knee.  Pt is able to work on strength and balance in the water. Emphasis today on strengthening and added aquatic cuffs for extra resistance.  Pt will benefit from using aquatic equipment to increase challenge and strengthen.   And ends with 3/10  Pt able to participate in 40 min of continuous exercise   EVAL- Patient presents  to clinic utilizing [facility] that needed adjustment and  CC of back pain and  L foot pain ( bone spur post injection) especially after traveling to [facility] to retrieve pregnant homeless daughter and her back is in spasm. She returned at 4:00 AM this morning. She has not had time to  exercise but knows she needs to prioritize her own health as she will soon be caring for dtr and new grandbaby due date July 19. Session today focused  endurance with 6 MWT and on core, proximal hip and LE strengthening with balance.    Patient continues to benefit from skilled PT services and should be progressed as able to improve functional independence.     OBJECTIVE IMPAIRMENTS decreased activity tolerance, decreased balance, decreased mobility, difficulty walking, decreased ROM, decreased strength, improper body mechanics, postural  dysfunction, obesity, and pain.    ACTIVITY LIMITATIONS carrying, bending, sitting, standing, squatting, sleeping, stairs, transfers, dressing, and locomotion level   PARTICIPATION LIMITATIONS:  driving, shopping, and exercising and    PERSONAL FACTORS L TKA 01-26-21, DDD, cardiac cath 2009, Sciatica, RTC syndrome, plantar fasciitis, carpal tunnel, OA, obesity,  See medical hx are also affecting patient's functional outcome.    REHAB POTENTIAL: Fair Pt currently has aide to care for needs at home domestic chores and some cooking   CLINICAL DECISION MAKING: Evolving/moderate complexity   EVALUATION COMPLEXITY: Moderate     GOALS: Goals reviewed with patient? Yes   SHORT TERM GOALS: Target date: 05/23/2022   Pt independent with initial HEP Baseline:Pt needs to reinforce HEP and consistency of exercise Goal status: MET   2.  Pt will perform 6 minutes walk test.  Baseline: only able to tolerate 2 minute walk test 278 ft (579 normal)  Pt unable to stand for more than 5-10 min due to pain  05-16-22 6MWT  927 feet ( normal 1635-1980 ft) Goal status: I NOT MET by goal   3.  Pt will return to aquatic therapy to update HEP for use on her own at local pool Baseline: Pt currently with no routine exercise program due to pain, Pt has not been to aquatics yet 05-16-22.  Beginning aquatic therapy 05-31-22 Goal status: MET   4.  Pt will improve SLS to at least 15 sec on R and L to show improved balance Baseline: unable to tolerate more than 3 sec SLS 05-25-22 less than 5 sec each Status 05/29/22: 8-12 sec  Goal status: NOT MET by goal date        LONG TERM GOALS: Target date: 06/20/2022   Pt will be independent with advanced HEP on land in order to maintain strength Baseline: Pt currently not doing any routine exercise  05-25-22 incorporating some exercise every day Goal status: Unmet   2.  Return to walking/standing for 25 minutes without stopping using LRAD in order to shop Baseline: Currently  not doing any exercise and cannot stand or walk greater than 5-10 minutes and must use riding grocery cart 05-25-22  walking around and caring for baby grand daughter just born Goal status: unmet   3.  Pt will use pain management in order to sleep for 4 or more hour of uninterrupted sleep Baseline: pt awakens every 2 hours due to pain and wakes whenever she turns in bed 05-23-22 Pt reports 3-4 hours of sleep a night Goal status: unmet   4.  Pt will be able to negotiate steps without exacerbation of pain greater than 3/10 Baseline: 7/10 currently in eval but can rise to 10/10 Goal status: unmet   5.  BERG balance with increase at least 10 points to 45/56 to show decreased risk of fall with LRAD Baseline: eval 35/56 using cane but told to utlize walker at all times Goal status: unmet   6.  ODI will improve  to at least 30/50 to show improvement in functional mobility Baseline: 40/50 80% disabilty Goal statuunmet   7. Pt will demonstrate floor to stand transfer safely for falls preparedness and decrease risk of fall            Baseline: pt unable to transfer floor to stand and has had 3 falls in last 6 months.            Goal status unmet   8. Pt will incorporate regular land based exercise in order to maintain strength for ADL/household tasks            Baseline:  Pt unable to do any regular exercise  due to pain and weakness.            Goal status unmet     PLAN: PT FREQUENCY: 2x/week   PT DURATION: 8 weeks   PLANNED INTERVENTIONS: Therapeutic exercises, Therapeutic activity, Neuromuscular re-education, Balance training, Gait training, Patient/Family education, and Joint mobilization.   PLAN FOR NEXT SESSION: progress strength and balance as tolerated. Review HEP.   Add resisted knee flex and ext.  ADD lunges and prepare for floor transfer etc check BERG  by Goal 9/10 visit  Voncille Lo, PT, [facility] Certified Exercise Expert for the Aging Adult  06/07/22 3:04 PM Phone:  [phone] Fax: [phone]  Voncille Lo, [place], [place] Certified Exercise Expert for the Aging Adult  08/02/22 2:57 PM Phone: [phone] Fax: [phone]

## 2022-06-06 DIAGNOSIS — M1712 Unilateral primary osteoarthritis, left knee: Secondary | ICD-10-CM | POA: Diagnosis not present

## 2022-06-07 ENCOUNTER — Ambulatory Visit: Payer: Medicaid Other | Attending: Sports Medicine | Admitting: Physical Therapy

## 2022-06-07 ENCOUNTER — Encounter: Payer: Self-pay | Admitting: Physical Therapy

## 2022-06-07 DIAGNOSIS — M25662 Stiffness of left knee, not elsewhere classified: Secondary | ICD-10-CM | POA: Insufficient documentation

## 2022-06-07 DIAGNOSIS — M6281 Muscle weakness (generalized): Secondary | ICD-10-CM | POA: Insufficient documentation

## 2022-06-07 DIAGNOSIS — G8929 Other chronic pain: Secondary | ICD-10-CM | POA: Insufficient documentation

## 2022-06-07 DIAGNOSIS — R262 Difficulty in walking, not elsewhere classified: Secondary | ICD-10-CM | POA: Insufficient documentation

## 2022-06-07 DIAGNOSIS — R296 Repeated falls: Secondary | ICD-10-CM | POA: Insufficient documentation

## 2022-06-07 DIAGNOSIS — M1712 Unilateral primary osteoarthritis, left knee: Secondary | ICD-10-CM | POA: Diagnosis not present

## 2022-06-07 DIAGNOSIS — R2681 Unsteadiness on feet: Secondary | ICD-10-CM | POA: Insufficient documentation

## 2022-06-07 DIAGNOSIS — M5440 Lumbago with sciatica, unspecified side: Secondary | ICD-10-CM | POA: Insufficient documentation

## 2022-06-08 DIAGNOSIS — M1712 Unilateral primary osteoarthritis, left knee: Secondary | ICD-10-CM | POA: Diagnosis not present

## 2022-06-09 DIAGNOSIS — M1712 Unilateral primary osteoarthritis, left knee: Secondary | ICD-10-CM | POA: Diagnosis not present

## 2022-06-10 DIAGNOSIS — M1712 Unilateral primary osteoarthritis, left knee: Secondary | ICD-10-CM | POA: Diagnosis not present

## 2022-06-11 DIAGNOSIS — M1712 Unilateral primary osteoarthritis, left knee: Secondary | ICD-10-CM | POA: Diagnosis not present

## 2022-06-12 DIAGNOSIS — M1712 Unilateral primary osteoarthritis, left knee: Secondary | ICD-10-CM | POA: Diagnosis not present

## 2022-06-13 ENCOUNTER — Ambulatory Visit: Payer: Medicaid Other | Admitting: Physical Therapy

## 2022-06-13 DIAGNOSIS — M1712 Unilateral primary osteoarthritis, left knee: Secondary | ICD-10-CM | POA: Diagnosis not present

## 2022-06-14 ENCOUNTER — Ambulatory Visit: Payer: Medicaid Other | Admitting: Physical Therapy

## 2022-06-14 DIAGNOSIS — M1712 Unilateral primary osteoarthritis, left knee: Secondary | ICD-10-CM | POA: Diagnosis not present

## 2022-06-15 DIAGNOSIS — M1712 Unilateral primary osteoarthritis, left knee: Secondary | ICD-10-CM | POA: Diagnosis not present

## 2022-06-16 DIAGNOSIS — M1712 Unilateral primary osteoarthritis, left knee: Secondary | ICD-10-CM | POA: Diagnosis not present

## 2022-06-17 DIAGNOSIS — M1712 Unilateral primary osteoarthritis, left knee: Secondary | ICD-10-CM | POA: Diagnosis not present

## 2022-06-18 DIAGNOSIS — M1712 Unilateral primary osteoarthritis, left knee: Secondary | ICD-10-CM | POA: Diagnosis not present

## 2022-06-19 ENCOUNTER — Other Ambulatory Visit: Payer: Self-pay

## 2022-06-19 ENCOUNTER — Encounter (HOSPITAL_COMMUNITY): Payer: Self-pay | Admitting: *Deleted

## 2022-06-19 ENCOUNTER — Emergency Department (HOSPITAL_COMMUNITY): Payer: Medicaid Other

## 2022-06-19 ENCOUNTER — Emergency Department (HOSPITAL_COMMUNITY)
Admission: EM | Admit: 2022-06-19 | Discharge: 2022-06-20 | Disposition: A | Discharge status: Home / Self Care | Payer: Medicaid Other | Attending: Emergency Medicine | Admitting: Emergency Medicine

## 2022-06-19 DIAGNOSIS — R0789 Other chest pain: Secondary | ICD-10-CM | POA: Diagnosis not present

## 2022-06-19 DIAGNOSIS — R079 Chest pain, unspecified: Secondary | ICD-10-CM | POA: Insufficient documentation

## 2022-06-19 DIAGNOSIS — E876 Hypokalemia: Secondary | ICD-10-CM | POA: Insufficient documentation

## 2022-06-19 DIAGNOSIS — M546 Pain in thoracic spine: Secondary | ICD-10-CM | POA: Insufficient documentation

## 2022-06-19 DIAGNOSIS — Z7901 Long term (current) use of anticoagulants: Secondary | ICD-10-CM | POA: Diagnosis not present

## 2022-06-19 DIAGNOSIS — M5431 Sciatica, right side: Secondary | ICD-10-CM

## 2022-06-19 DIAGNOSIS — M1712 Unilateral primary osteoarthritis, left knee: Secondary | ICD-10-CM | POA: Diagnosis not present

## 2022-06-19 DIAGNOSIS — Z7982 Long term (current) use of aspirin: Secondary | ICD-10-CM | POA: Diagnosis not present

## 2022-06-19 DIAGNOSIS — M5441 Lumbago with sciatica, right side: Secondary | ICD-10-CM | POA: Insufficient documentation

## 2022-06-19 LAB — BASIC METABOLIC PANEL
Anion gap: 8 (ref 5–15)
BUN: 7 mg/dL (ref 6–20)
CO2: 26 mmol/L (ref 22–32)
Calcium: 8.6 mg/dL — ABNORMAL LOW (ref 8.9–10.3)
Chloride: 105 mmol/L (ref 98–111)
Creatinine, Ser: 0.77 mg/dL (ref 0.44–1.00)
GFR, Estimated: >60 mL/min (ref >60)
Glucose, Bld: 117 mg/dL — ABNORMAL HIGH (ref 70–99)
Potassium: 3.2 mmol/L — ABNORMAL LOW (ref 3.5–5.1)
Sodium: 139 mmol/L (ref 135–145)

## 2022-06-19 LAB — CBC WITH DIFFERENTIAL/PLATELET
Abs Immature Granulocytes: 0.02 10*3/uL (ref 0.00–0.07)
Basophils Absolute: 0.1 10*3/uL (ref 0.0–0.1)
Basophils Relative: 1 %
Eosinophils Absolute: 0.1 10*3/uL (ref 0.0–0.5)
Eosinophils Relative: 1 %
HCT: 40.8 % (ref 36.0–46.0)
Hemoglobin: 13.8 g/dL (ref 12.0–15.0)
Immature Granulocytes: 0 %
Lymphocytes Relative: 43 %
Lymphs Abs: 3.9 10*3/uL (ref 0.7–4.0)
MCH: 32.3 pg (ref 26.0–34.0)
MCHC: 33.8 g/dL (ref 30.0–36.0)
MCV: 95.6 fL (ref 80.0–100.0)
Monocytes Absolute: 0.6 10*3/uL (ref 0.1–1.0)
Monocytes Relative: 7 %
Neutro Abs: 4.4 10*3/uL (ref 1.7–7.7)
Neutrophils Relative %: 48 %
Platelets: 343 10*3/uL (ref 150–400)
RBC: 4.27 MIL/uL (ref 3.87–5.11)
RDW: 12.8 % (ref 11.5–15.5)
WBC: 9 10*3/uL (ref 4.0–10.5)
nRBC: 0 % (ref 0.0–0.2)

## 2022-06-19 LAB — TROPONIN I (HIGH SENSITIVITY): Troponin I (High Sensitivity): 3 ng/L (ref <18)

## 2022-06-19 MED ORDER — OXYCODONE HCL 5 MG PO TABS
5.0000 mg | ORAL_TABLET | Freq: Once | ORAL | Status: AC
  Administered 2022-06-19: 5 mg via ORAL
  Filled 2022-06-19: qty 1

## 2022-06-19 NOTE — ED Triage Notes (Signed)
Pt stating right sided sciatica for one week that has been radiating down her leg, says it moves up throughout her right chest, arm and neck. "Sharp". Some shortness of breath. Denies recent fall or injury. Hx of sciatica and arthritis.

## 2022-06-19 NOTE — ED Provider Triage Note (Signed)
Emergency Medicine Provider Triage Evaluation Note  Katie [name] , a 56 y.o. female  was evaluated in triage.  Pt complains of back pain and chest pain.  Patient reports that she has been dealing with pain to right lumbar back for the last week.  Patient reports that she has a history of sciatica and this pain has felt similar.  Patient states that pain does radiate into her right lower leg.  Patient states that today pain began radiating to her chest and right upper extremity.  Patient describes her chest pain as "sharp."  Patient does endorse some shortness of breath.  Denies any associated nausea, vomiting, palpitations, leg swelling or tenderness.  Review of Systems  Positive: Chest pain, back pain, shortness of breath Negative: Saddle anesthesia, numbness, weakness, bowel/bladder dysfunction, fever/chills  Physical Exam  BP 115/77 (BP Location: Right Arm)   Pulse 83   Temp 98.3 F (36.8 C) (Oral)   Resp 18   LMP 12/28/2015 (Exact Date)   SpO2 97%  Gen:   Awake, no distress   Resp:  Normal effort  MSK:   Moves extremities without difficulty  Other:  Diffuse tenderness to lumbar back.  No midline tenderness or deformity to cervical, thoracic, lumbar spine.  +2 radial pulse bilaterally.  Lungs clear to auscultation bilaterally.  Medical Decision Making  Medically screening exam initiated at 8:25 PM.  Appropriate orders placed.  Katie [name] was informed that the remainder of the evaluation will be completed by another provider, this initial triage assessment does not replace that evaluation, and the importance of remaining in the ED until their evaluation is complete.     Katie [name], [place] 06/19/22 2027

## 2022-06-20 ENCOUNTER — Ambulatory Visit: Payer: Medicaid Other | Admitting: Physical Therapy

## 2022-06-20 ENCOUNTER — Telehealth: Payer: Self-pay | Admitting: Physical Therapy

## 2022-06-20 DIAGNOSIS — M1712 Unilateral primary osteoarthritis, left knee: Secondary | ICD-10-CM | POA: Diagnosis not present

## 2022-06-20 LAB — TROPONIN I (HIGH SENSITIVITY): Troponin I (High Sensitivity): 3 ng/L (ref <18)

## 2022-06-20 MED ORDER — LIDOCAINE 5 % EX PTCH: 1.0000 | MEDICATED_PATCH | CUTANEOUS | 0 refills | Status: DC

## 2022-06-20 MED ORDER — OXYCODONE HCL 5 MG PO TABS: 5.0000 mg | ORAL_TABLET | Freq: Four times a day (QID) | ORAL | 0 refills | Status: DC | PRN

## 2022-06-20 MED ORDER — POTASSIUM CHLORIDE CRYS ER 20 MEQ PO TBCR
40.0000 meq | EXTENDED_RELEASE_TABLET | Freq: Once | ORAL | Status: AC
Start: 2022-06-20 — End: 2022-06-20
  Administered 2022-06-20: 40 meq via ORAL
  Filled 2022-06-20: qty 2

## 2022-06-20 MED ORDER — METHOCARBAMOL 500 MG PO TABS: 1000.0000 mg | ORAL_TABLET | Freq: Three times a day (TID) | ORAL | 0 refills | Status: DC | PRN

## 2022-06-20 MED ORDER — OXYCODONE HCL 5 MG PO TABS
5.0000 mg | ORAL_TABLET | Freq: Once | ORAL | Status: AC
  Administered 2022-06-20: 5 mg via ORAL
  Filled 2022-06-20: qty 1

## 2022-06-20 NOTE — ED Notes (Signed)
Pt moved to hall 1.   Pt here with sciatic type pain that is on the right side - has history of the same for 20 years and has been worse for 1 weeks.  Also complains of it radiating to knee and toes on same side.  Pain 8/10.  EDP at bedside.

## 2022-06-20 NOTE — Telephone Encounter (Signed)
Spoke with Pt.  Pt in ED with CP and sciatica since 06-19-22 and remained in ED.  Pt is cancelling appt on schedule and will return to MD for further evaluation. Pt will be DC for now and return to MD for further evaluation.  Garen Lah, PT, [facility] Certified Exercise Expert for the Aging Adult  06/20/22 8:42 AM Phone: [phone] Fax: [phone]

## 2022-06-20 NOTE — ED Provider Notes (Signed)
[facility] EMERGENCY DEPARTMENT Provider Note   CSN: [id number] Arrival date & time: 06/19/22  2003     History  Chief Complaint  Patient presents with   Back Pain    Katie [name] is a 56 y.o. female.  HPI 56 year old female presents with back pain and chest pain.  She has recurrent sciatica that she been having for about 20 years.  This is right-sided and has been worsening for about a week.  Goes down to her knee and recently has been going down to her toes.  Since yesterday around 6 PM she developed chest pain.  Felt like the pain went from her sciatica up into her chest and now into her right arm.  It is right-sided chest pain.  She had this before and has been seen in the ED.  Its been continuous since 6 PM.  No shortness of breath.  No bowel/bladder incontinence.  Her right leg is so painful it feels weak and like is going to give out on her but no actual weakness.  Her right arm has been weak and painful for years.  No new numbness.  She has not take anything for the pain because she is unable to take NSAIDs due to her Xarelto use and states she is allergic to Tylenol.  She was given oxycodone in the waiting room which seem to transiently help.  No abdominal pain.  Home Medications Prior to Admission medications   Medication Sig Start Date End Date Taking? Authorizing Provider  lidocaine (LIDODERM) 5 % Place 1 patch onto the skin daily. Remove & Discard patch within 12 hours or as directed by MD 06/20/22  Yes Pricilla Loveless, MD  methocarbamol (ROBAXIN) 500 MG tablet Take 2 tablets (1,000 mg total) by mouth every 8 (eight) hours as needed for muscle spasms. 06/20/22  Yes Pricilla Loveless, MD  oxyCODONE (ROXICODONE) 5 MG immediate release tablet Take 1 tablet (5 mg total) by mouth every 6 (six) hours as needed for severe pain. 06/20/22  Yes Pricilla Loveless, MD  aspirin 81 MG chewable tablet Chew 81 mg by mouth daily.    [provider]  diazepam (VALIUM) 10 MG  tablet Take 10 mg by mouth 2 (two) times daily. 06/25/21   [provider]  diphenhydrAMINE (BENADRYL) 25 MG tablet Take 1 tablet (25 mg total) by mouth every 6 (six) hours as needed for itching. Patient not taking: Reported on 04/25/2022 07/27/21   Burnadette Pop, MD  famotidine (PEPCID) 20 MG tablet Take 1 tablet (20 mg total) by mouth daily for 3 days. Patient not taking: Reported on 11/16/2021 07/27/21 07/30/21  Burnadette Pop, MD  furosemide (LASIX) 40 MG tablet Take 1 tablet (40 mg total) by mouth daily. 08/02/13   Zannie Cove, MD  gabapentin (NEURONTIN) 300 MG capsule Take 2 capsules (600 mg total) by mouth 3 (three) times daily. Patient not taking: Reported on 11/16/2021 05/01/21 07/26/21  Gerhard Munch, MD  meclizine (ANTIVERT) 25 MG tablet Take 25 mg by mouth every 6 (six) hours as needed for dizziness. 09/23/21   [provider]  omeprazole (PRILOSEC) 40 MG capsule Take 40 mg by mouth daily.    [provider]  phentermine (ADIPEX-P) 37.5 MG tablet Take 37.5 mg by mouth every morning.     [provider]  potassium chloride SA (K-DUR,KLOR-CON) 20 MEQ tablet Take 20 mEq by mouth 2 (two) times daily. 05/03/16   [provider]  Rivaroxaban (XARELTO) 20 MG TABS tablet  Take 1 tablet (20 mg total) by mouth daily with supper. Starting 10/18, take 20mg  daily Patient taking differently: Take 20 mg by mouth daily with supper. 08/23/13   08/25/13, MD      Allergies    Contrast media [iodinated contrast media], Percocet [oxycodone-acetaminophen], Toradol [ketorolac tromethamine], Tylenol [acetaminophen], and Vicodin [hydrocodone-acetaminophen]    Review of Systems   Review of Systems  Respiratory:  Negative for cough and shortness of breath.   Cardiovascular:  Positive for chest pain.  Gastrointestinal:  Negative for abdominal pain.  Musculoskeletal:  Positive for back pain.  Neurological:  Negative for numbness.    Physical Exam Updated  Vital Signs BP 136/85 (BP Location: Left Arm)   Pulse (!) 57   Temp (!) 97.5 F (36.4 C) (Oral)   Resp 20   LMP 12/28/2015 (Exact Date)   SpO2 96%  Physical Exam Vitals and nursing note reviewed.  Constitutional:      Appearance: She is well-developed.  HENT:     Head: Normocephalic and atraumatic.  Cardiovascular:     Rate and Rhythm: Normal rate and regular rhythm.     Heart sounds: Normal heart sounds.  Pulmonary:     Effort: Pulmonary effort is normal.     Breath sounds: Normal breath sounds.  Abdominal:     General: There is no distension.     Palpations: Abdomen is soft.     Tenderness: There is no abdominal tenderness.  Musculoskeletal:     Thoracic back: Tenderness present.     Lumbar back: Tenderness present.  Skin:    General: Skin is warm and dry.  Neurological:     Mental Status: She is alert.     Comments: Normal strength in left upper and left lower extremities.  The right lower extremity is limited due to pain but she is able to get up and walk with me.  The right upper extremity has giveaway weakness and this is a chronic issue for her.  Grossly normal sensation throughout.     ED Results / Procedures / Treatments   Labs (all labs ordered are listed, but only abnormal results are displayed) Labs Reviewed  BASIC METABOLIC PANEL - Abnormal; Notable for the following components:      Result Value   Potassium 3.2 (*)    Glucose, Bld 117 (*)    Calcium 8.6 (*)    All other components within normal limits  CBC WITH DIFFERENTIAL/PLATELET  TROPONIN I (HIGH SENSITIVITY)  TROPONIN I (HIGH SENSITIVITY)    EKG EKG Interpretation  Date/Time:  Monday June 19 2022 20:13:16 EDT Ventricular Rate:  70 PR Interval:  164 QRS Duration: 92 QT Interval:  408 QTC Calculation: 440 R Axis:   -42 Text Interpretation: Normal sinus rhythm Left axis deviation  similar to Jan 2023 Confirmed by Feb 2023 [phone]) on 06/20/2022 10:04:07 AM  Radiology DG Chest 2  View  Result Date: 06/19/2022 CLINICAL DATA:  Chest pain. EXAM: CHEST - 2 VIEW COMPARISON:  04/16/2022. FINDINGS: The heart size and mediastinal contours are within normal limits. Both lungs are clear. No acute osseous abnormality. IMPRESSION: No active cardiopulmonary disease. Electronically Signed   By: 06/16/2022 M.D.   On: 06/19/2022 20:55    Procedures Procedures    Medications Ordered in ED Medications  oxyCODONE (Oxy IR/ROXICODONE) immediate release tablet 5 mg (has no administration in time range)  potassium chloride SA (KLOR-CON M) CR tablet 40 mEq (has no administration in time range)  oxyCODONE (Oxy  IR/ROXICODONE) immediate release tablet 5 mg (5 mg Oral Given 06/19/22 2055)    ED Course/ Medical Decision Making/ A&P                           Medical Decision Making Amount and/or Complexity of Data Reviewed Labs:     Details: Mild hypokalemia but otherwise labs unremarkable including 2 negative troponins. Radiology: independent interpretation performed.    Details: No pneumothorax or pneumonia ECG/medicine tests: independent interpretation performed.    Details: No acute ST/T changes  Risk Prescription drug management.   Patient seems to be having an exacerbation of her chronic sciatica.  It is atypical that is going up into her chest and then causing her right arm to hurt although the right arm seems to be more of a chronic problem.  The chest pain is pretty atypical and I doubt ACS.  EKG is okay.  Troponins are okay.  Mild hypokalemia that I think is not related but we will replete.  At this point I think is mostly a pain control issue.  She is on Xarelto and so I doubt PE.  I think dissection is unlikely.  I also doubt spinal cord emergency based on presentation.  I do not think emergent MRI imaging is needed.  I do not think she needs emergency surgery.  At this point will attempt pain control and we will give a short course of oxycodone, muscle relaxer and Lidoderm.   She was given return precautions and instructed to follow-up with PCP and her spine specialist.        Final Clinical Impression(s) / ED Diagnoses Final diagnoses:  Right sided sciatica  Nonspecific chest pain    Rx / DC Orders ED Discharge Orders          Ordered    oxyCODONE (ROXICODONE) 5 MG immediate release tablet  Every 6 hours PRN        06/20/22 1105    methocarbamol (ROBAXIN) 500 MG tablet  Every 8 hours PRN        06/20/22 1105    lidocaine (LIDODERM) 5 %  Every 24 hours        06/20/22 1105              Sherwood Gambler, MD 06/20/22 1111

## 2022-06-20 NOTE — Discharge Instructions (Addendum)
If you develop worsening, recurrent, or continued back pain, numbness or weakness in the legs, incontinence of your bowels or bladders, numbness of your buttocks, fever, abdominal pain, or any other new/concerning symptoms then return to the ER for evaluation.  

## 2022-06-21 ENCOUNTER — Ambulatory Visit: Payer: Medicaid Other | Admitting: Physical Therapy

## 2022-06-21 DIAGNOSIS — M1712 Unilateral primary osteoarthritis, left knee: Secondary | ICD-10-CM | POA: Diagnosis not present

## 2022-06-22 DIAGNOSIS — M1712 Unilateral primary osteoarthritis, left knee: Secondary | ICD-10-CM | POA: Diagnosis not present

## 2022-06-23 DIAGNOSIS — M1712 Unilateral primary osteoarthritis, left knee: Secondary | ICD-10-CM | POA: Diagnosis not present

## 2022-06-24 DIAGNOSIS — M1712 Unilateral primary osteoarthritis, left knee: Secondary | ICD-10-CM | POA: Diagnosis not present

## 2022-06-25 DIAGNOSIS — M1712 Unilateral primary osteoarthritis, left knee: Secondary | ICD-10-CM | POA: Diagnosis not present

## 2022-06-26 ENCOUNTER — Ambulatory Visit: Payer: Medicaid Other | Admitting: Physical Therapy

## 2022-06-26 DIAGNOSIS — M1712 Unilateral primary osteoarthritis, left knee: Secondary | ICD-10-CM | POA: Diagnosis not present

## 2022-06-27 DIAGNOSIS — M1712 Unilateral primary osteoarthritis, left knee: Secondary | ICD-10-CM | POA: Diagnosis not present

## 2022-06-28 DIAGNOSIS — M1712 Unilateral primary osteoarthritis, left knee: Secondary | ICD-10-CM | POA: Diagnosis not present

## 2022-06-29 DIAGNOSIS — M1712 Unilateral primary osteoarthritis, left knee: Secondary | ICD-10-CM | POA: Diagnosis not present

## 2022-06-30 DIAGNOSIS — M1712 Unilateral primary osteoarthritis, left knee: Secondary | ICD-10-CM | POA: Diagnosis not present

## 2022-07-01 DIAGNOSIS — M1712 Unilateral primary osteoarthritis, left knee: Secondary | ICD-10-CM | POA: Diagnosis not present

## 2022-07-02 DIAGNOSIS — M1712 Unilateral primary osteoarthritis, left knee: Secondary | ICD-10-CM | POA: Diagnosis not present

## 2022-07-03 DIAGNOSIS — M1712 Unilateral primary osteoarthritis, left knee: Secondary | ICD-10-CM | POA: Diagnosis not present

## 2022-07-04 DIAGNOSIS — M1712 Unilateral primary osteoarthritis, left knee: Secondary | ICD-10-CM | POA: Diagnosis not present

## 2022-07-05 DIAGNOSIS — M1712 Unilateral primary osteoarthritis, left knee: Secondary | ICD-10-CM | POA: Diagnosis not present

## 2022-07-06 DIAGNOSIS — M1712 Unilateral primary osteoarthritis, left knee: Secondary | ICD-10-CM | POA: Diagnosis not present

## 2022-07-07 DIAGNOSIS — M1712 Unilateral primary osteoarthritis, left knee: Secondary | ICD-10-CM | POA: Diagnosis not present

## 2022-07-08 DIAGNOSIS — M1712 Unilateral primary osteoarthritis, left knee: Secondary | ICD-10-CM | POA: Diagnosis not present

## 2022-07-09 DIAGNOSIS — M1712 Unilateral primary osteoarthritis, left knee: Secondary | ICD-10-CM | POA: Diagnosis not present

## 2022-07-10 DIAGNOSIS — M1712 Unilateral primary osteoarthritis, left knee: Secondary | ICD-10-CM | POA: Diagnosis not present

## 2022-07-11 DIAGNOSIS — M1712 Unilateral primary osteoarthritis, left knee: Secondary | ICD-10-CM | POA: Diagnosis not present

## 2022-07-12 DIAGNOSIS — M1712 Unilateral primary osteoarthritis, left knee: Secondary | ICD-10-CM | POA: Diagnosis not present

## 2022-07-13 DIAGNOSIS — M1712 Unilateral primary osteoarthritis, left knee: Secondary | ICD-10-CM | POA: Diagnosis not present

## 2022-07-14 DIAGNOSIS — M1712 Unilateral primary osteoarthritis, left knee: Secondary | ICD-10-CM | POA: Diagnosis not present

## 2022-07-15 DIAGNOSIS — M1712 Unilateral primary osteoarthritis, left knee: Secondary | ICD-10-CM | POA: Diagnosis not present

## 2022-07-16 DIAGNOSIS — M1712 Unilateral primary osteoarthritis, left knee: Secondary | ICD-10-CM | POA: Diagnosis not present

## 2022-07-17 DIAGNOSIS — M1712 Unilateral primary osteoarthritis, left knee: Secondary | ICD-10-CM | POA: Diagnosis not present

## 2022-07-18 DIAGNOSIS — M1712 Unilateral primary osteoarthritis, left knee: Secondary | ICD-10-CM | POA: Diagnosis not present

## 2022-07-19 DIAGNOSIS — M1712 Unilateral primary osteoarthritis, left knee: Secondary | ICD-10-CM | POA: Diagnosis not present

## 2022-07-20 DIAGNOSIS — M1712 Unilateral primary osteoarthritis, left knee: Secondary | ICD-10-CM | POA: Diagnosis not present

## 2022-07-21 DIAGNOSIS — M1712 Unilateral primary osteoarthritis, left knee: Secondary | ICD-10-CM | POA: Diagnosis not present

## 2022-07-22 DIAGNOSIS — M1712 Unilateral primary osteoarthritis, left knee: Secondary | ICD-10-CM | POA: Diagnosis not present

## 2022-07-23 DIAGNOSIS — M1712 Unilateral primary osteoarthritis, left knee: Secondary | ICD-10-CM | POA: Diagnosis not present

## 2022-07-24 DIAGNOSIS — M1712 Unilateral primary osteoarthritis, left knee: Secondary | ICD-10-CM | POA: Diagnosis not present

## 2022-07-25 DIAGNOSIS — M1712 Unilateral primary osteoarthritis, left knee: Secondary | ICD-10-CM | POA: Diagnosis not present

## 2022-07-26 DIAGNOSIS — M1712 Unilateral primary osteoarthritis, left knee: Secondary | ICD-10-CM | POA: Diagnosis not present

## 2022-07-27 DIAGNOSIS — M1712 Unilateral primary osteoarthritis, left knee: Secondary | ICD-10-CM | POA: Diagnosis not present

## 2022-07-28 DIAGNOSIS — M1712 Unilateral primary osteoarthritis, left knee: Secondary | ICD-10-CM | POA: Diagnosis not present

## 2022-07-29 DIAGNOSIS — M1712 Unilateral primary osteoarthritis, left knee: Secondary | ICD-10-CM | POA: Diagnosis not present

## 2022-07-30 DIAGNOSIS — M1712 Unilateral primary osteoarthritis, left knee: Secondary | ICD-10-CM | POA: Diagnosis not present

## 2022-07-31 DIAGNOSIS — M1712 Unilateral primary osteoarthritis, left knee: Secondary | ICD-10-CM | POA: Diagnosis not present

## 2022-08-01 DIAGNOSIS — M1712 Unilateral primary osteoarthritis, left knee: Secondary | ICD-10-CM | POA: Diagnosis not present

## 2022-08-02 DIAGNOSIS — M1712 Unilateral primary osteoarthritis, left knee: Secondary | ICD-10-CM | POA: Diagnosis not present

## 2022-08-03 DIAGNOSIS — M1712 Unilateral primary osteoarthritis, left knee: Secondary | ICD-10-CM | POA: Diagnosis not present

## 2022-08-04 DIAGNOSIS — M1712 Unilateral primary osteoarthritis, left knee: Secondary | ICD-10-CM | POA: Diagnosis not present

## 2022-08-05 DIAGNOSIS — M1712 Unilateral primary osteoarthritis, left knee: Secondary | ICD-10-CM | POA: Diagnosis not present

## 2022-08-06 DIAGNOSIS — M1712 Unilateral primary osteoarthritis, left knee: Secondary | ICD-10-CM | POA: Diagnosis not present

## 2022-08-07 DIAGNOSIS — M1712 Unilateral primary osteoarthritis, left knee: Secondary | ICD-10-CM | POA: Diagnosis not present

## 2022-08-08 DIAGNOSIS — M1712 Unilateral primary osteoarthritis, left knee: Secondary | ICD-10-CM | POA: Diagnosis not present

## 2022-08-09 DIAGNOSIS — M1712 Unilateral primary osteoarthritis, left knee: Secondary | ICD-10-CM | POA: Diagnosis not present

## 2022-08-10 DIAGNOSIS — M1712 Unilateral primary osteoarthritis, left knee: Secondary | ICD-10-CM | POA: Diagnosis not present

## 2022-08-11 DIAGNOSIS — M1712 Unilateral primary osteoarthritis, left knee: Secondary | ICD-10-CM | POA: Diagnosis not present

## 2022-08-12 DIAGNOSIS — M1712 Unilateral primary osteoarthritis, left knee: Secondary | ICD-10-CM | POA: Diagnosis not present

## 2022-08-13 DIAGNOSIS — M1712 Unilateral primary osteoarthritis, left knee: Secondary | ICD-10-CM | POA: Diagnosis not present

## 2022-08-14 DIAGNOSIS — M1712 Unilateral primary osteoarthritis, left knee: Secondary | ICD-10-CM | POA: Diagnosis not present

## 2022-08-15 DIAGNOSIS — M1712 Unilateral primary osteoarthritis, left knee: Secondary | ICD-10-CM | POA: Diagnosis not present

## 2022-08-16 DIAGNOSIS — M1712 Unilateral primary osteoarthritis, left knee: Secondary | ICD-10-CM | POA: Diagnosis not present

## 2022-08-17 DIAGNOSIS — M1712 Unilateral primary osteoarthritis, left knee: Secondary | ICD-10-CM | POA: Diagnosis not present

## 2022-08-18 DIAGNOSIS — M1712 Unilateral primary osteoarthritis, left knee: Secondary | ICD-10-CM | POA: Diagnosis not present

## 2022-08-19 DIAGNOSIS — M1712 Unilateral primary osteoarthritis, left knee: Secondary | ICD-10-CM | POA: Diagnosis not present

## 2022-08-20 DIAGNOSIS — M1712 Unilateral primary osteoarthritis, left knee: Secondary | ICD-10-CM | POA: Diagnosis not present

## 2022-08-21 DIAGNOSIS — M1712 Unilateral primary osteoarthritis, left knee: Secondary | ICD-10-CM | POA: Diagnosis not present

## 2022-08-22 DIAGNOSIS — M1712 Unilateral primary osteoarthritis, left knee: Secondary | ICD-10-CM | POA: Diagnosis not present

## 2022-08-23 DIAGNOSIS — M1712 Unilateral primary osteoarthritis, left knee: Secondary | ICD-10-CM | POA: Diagnosis not present

## 2022-08-24 DIAGNOSIS — M1712 Unilateral primary osteoarthritis, left knee: Secondary | ICD-10-CM | POA: Diagnosis not present

## 2022-08-25 DIAGNOSIS — M1712 Unilateral primary osteoarthritis, left knee: Secondary | ICD-10-CM | POA: Diagnosis not present

## 2022-08-26 DIAGNOSIS — M1712 Unilateral primary osteoarthritis, left knee: Secondary | ICD-10-CM | POA: Diagnosis not present

## 2022-08-27 ENCOUNTER — Emergency Department (HOSPITAL_COMMUNITY)
Admission: EM | Admit: 2022-08-27 | Discharge: 2022-08-28 | Disposition: A | Discharge status: Home / Self Care | Payer: Medicaid Other | Attending: Emergency Medicine | Admitting: Emergency Medicine

## 2022-08-27 ENCOUNTER — Emergency Department (HOSPITAL_COMMUNITY): Payer: Medicaid Other

## 2022-08-27 ENCOUNTER — Encounter (HOSPITAL_COMMUNITY): Payer: Self-pay | Admitting: *Deleted

## 2022-08-27 ENCOUNTER — Other Ambulatory Visit: Payer: Self-pay

## 2022-08-27 DIAGNOSIS — S0093XA Contusion of unspecified part of head, initial encounter: Secondary | ICD-10-CM | POA: Diagnosis not present

## 2022-08-27 DIAGNOSIS — S7001XA Contusion of right hip, initial encounter: Secondary | ICD-10-CM | POA: Diagnosis not present

## 2022-08-27 DIAGNOSIS — W01198A Fall on same level from slipping, tripping and stumbling with subsequent striking against other object, initial encounter: Secondary | ICD-10-CM | POA: Diagnosis not present

## 2022-08-27 DIAGNOSIS — S1093XA Contusion of unspecified part of neck, initial encounter: Secondary | ICD-10-CM | POA: Insufficient documentation

## 2022-08-27 DIAGNOSIS — T07XXXA Unspecified multiple injuries, initial encounter: Secondary | ICD-10-CM

## 2022-08-27 DIAGNOSIS — Y9301 Activity, walking, marching and hiking: Secondary | ICD-10-CM | POA: Insufficient documentation

## 2022-08-27 DIAGNOSIS — M47812 Spondylosis without myelopathy or radiculopathy, cervical region: Secondary | ICD-10-CM | POA: Diagnosis not present

## 2022-08-27 DIAGNOSIS — W19XXXA Unspecified fall, initial encounter: Secondary | ICD-10-CM

## 2022-08-27 DIAGNOSIS — I10 Essential (primary) hypertension: Secondary | ICD-10-CM | POA: Diagnosis not present

## 2022-08-27 DIAGNOSIS — Z7982 Long term (current) use of aspirin: Secondary | ICD-10-CM | POA: Diagnosis not present

## 2022-08-27 DIAGNOSIS — Z7901 Long term (current) use of anticoagulants: Secondary | ICD-10-CM | POA: Insufficient documentation

## 2022-08-27 DIAGNOSIS — S8002XA Contusion of left knee, initial encounter: Secondary | ICD-10-CM | POA: Diagnosis not present

## 2022-08-27 DIAGNOSIS — Z79899 Other long term (current) drug therapy: Secondary | ICD-10-CM | POA: Diagnosis not present

## 2022-08-27 DIAGNOSIS — S0990XA Unspecified injury of head, initial encounter: Secondary | ICD-10-CM | POA: Diagnosis not present

## 2022-08-27 DIAGNOSIS — R519 Headache, unspecified: Secondary | ICD-10-CM | POA: Insufficient documentation

## 2022-08-27 DIAGNOSIS — M542 Cervicalgia: Secondary | ICD-10-CM | POA: Diagnosis not present

## 2022-08-27 DIAGNOSIS — M25551 Pain in right hip: Secondary | ICD-10-CM | POA: Diagnosis not present

## 2022-08-27 DIAGNOSIS — M1712 Unilateral primary osteoarthritis, left knee: Secondary | ICD-10-CM | POA: Diagnosis not present

## 2022-08-27 DIAGNOSIS — S199XXA Unspecified injury of neck, initial encounter: Secondary | ICD-10-CM | POA: Diagnosis not present

## 2022-08-27 DIAGNOSIS — S79911A Unspecified injury of right hip, initial encounter: Secondary | ICD-10-CM | POA: Diagnosis present

## 2022-08-27 NOTE — ED Notes (Signed)
Patient transported to CT 

## 2022-08-27 NOTE — ED Triage Notes (Addendum)
Pt states she was cooking around 2145 on Saturday night, her left knee locked up on her, she fell onto her right side on to the ground.. She c/o pain in the right side from her hip up through her ribs.She c/o pain in her neck.  She is not for sure if she hit her head (she is calling her daughter to see if she saw her hit her head), but denies LOC. She is on xarelto. She denies n/v or headache. Breath sounds clear.

## 2022-08-27 NOTE — ED Notes (Signed)
ED Provider at bedside. 

## 2022-08-27 NOTE — ED Provider Triage Note (Signed)
  Emergency Medicine Provider Triage Evaluation Note  MRN:  [id number]  Arrival date & time: 08/27/22    Medically screening exam initiated at 11:16 PM.   CC:   Fall   HPI:  Katie [name] is a 56 y.o. year-old female presents to the ED with chief complaint of fall from standing 24 hours ago.  Takes Xarelto for PE.  Uncertain if she hit her head.  Complains of right hip pain/flank pain.  History provided by patient. ROS:  -As included in HPI PE:   Vitals:   08/27/22 2252  BP: 124/80  Pulse: (!) 55  Resp: 17  Temp: 98.1 F (36.7 C)  SpO2: 100%    Non-toxic appearing No respiratory distress  MDM:  Based on signs and symptoms, trauma from fall is highest on my differential. I've ordered imaging in triage to expedite lab/diagnostic workup.  Patient was informed that the remainder of the evaluation will be completed by another provider, this initial triage assessment does not replace that evaluation, and the importance of remaining in the ED until their evaluation is complete.    Montine Circle, PA-C 08/27/22 2317

## 2022-08-28 ENCOUNTER — Emergency Department (HOSPITAL_COMMUNITY): Payer: Medicaid Other

## 2022-08-28 DIAGNOSIS — S0990XA Unspecified injury of head, initial encounter: Secondary | ICD-10-CM | POA: Diagnosis not present

## 2022-08-28 DIAGNOSIS — M1712 Unilateral primary osteoarthritis, left knee: Secondary | ICD-10-CM | POA: Diagnosis not present

## 2022-08-28 DIAGNOSIS — S199XXA Unspecified injury of neck, initial encounter: Secondary | ICD-10-CM | POA: Diagnosis not present

## 2022-08-28 DIAGNOSIS — M47812 Spondylosis without myelopathy or radiculopathy, cervical region: Secondary | ICD-10-CM | POA: Diagnosis not present

## 2022-08-28 LAB — BASIC METABOLIC PANEL
Anion gap: 8 (ref 5–15)
BUN: 11 mg/dL (ref 6–20)
CO2: 23 mmol/L (ref 22–32)
Calcium: 8.8 mg/dL — ABNORMAL LOW (ref 8.9–10.3)
Chloride: 108 mmol/L (ref 98–111)
Creatinine, Ser: 0.7 mg/dL (ref 0.44–1.00)
GFR, Estimated: >60 mL/min (ref >60)
Glucose, Bld: 104 mg/dL — ABNORMAL HIGH (ref 70–99)
Potassium: 3.8 mmol/L (ref 3.5–5.1)
Sodium: 139 mmol/L (ref 135–145)

## 2022-08-28 LAB — CBC WITH DIFFERENTIAL/PLATELET
Abs Immature Granulocytes: 0.04 10*3/uL (ref 0.00–0.07)
Basophils Absolute: 0.1 10*3/uL (ref 0.0–0.1)
Basophils Relative: 1 %
Eosinophils Absolute: 0.1 10*3/uL (ref 0.0–0.5)
Eosinophils Relative: 2 %
HCT: 39.9 % (ref 36.0–46.0)
Hemoglobin: 13.3 g/dL (ref 12.0–15.0)
Immature Granulocytes: 1 %
Lymphocytes Relative: 45 %
Lymphs Abs: 3.7 10*3/uL (ref 0.7–4.0)
MCH: 32.2 pg (ref 26.0–34.0)
MCHC: 33.3 g/dL (ref 30.0–36.0)
MCV: 96.6 fL (ref 80.0–100.0)
Monocytes Absolute: 0.5 10*3/uL (ref 0.1–1.0)
Monocytes Relative: 6 %
Neutro Abs: 3.9 10*3/uL (ref 1.7–7.7)
Neutrophils Relative %: 45 %
Platelets: 297 10*3/uL (ref 150–400)
RBC: 4.13 MIL/uL (ref 3.87–5.11)
RDW: 13.2 % (ref 11.5–15.5)
WBC: 8.3 10*3/uL (ref 4.0–10.5)
nRBC: 0 % (ref 0.0–0.2)

## 2022-08-28 MED ORDER — TRAMADOL HCL 50 MG PO TABS
50.0000 mg | ORAL_TABLET | Freq: Once | ORAL | Status: AC
  Administered 2022-08-28: 50 mg via ORAL
  Filled 2022-08-28: qty 1

## 2022-08-28 MED ORDER — METHOCARBAMOL 500 MG PO TABS: 1000.0000 mg | ORAL_TABLET | Freq: Three times a day (TID) | ORAL | 0 refills | Status: DC | PRN

## 2022-08-28 MED ORDER — METHOCARBAMOL 500 MG PO TABS
1000.0000 mg | ORAL_TABLET | Freq: Once | ORAL | Status: AC
Start: 2022-08-28 — End: 2022-08-28
  Administered 2022-08-28: 1000 mg via ORAL
  Filled 2022-08-28: qty 2

## 2022-08-28 MED ORDER — LIDOCAINE 5 % EX PTCH: 1.0000 | MEDICATED_PATCH | CUTANEOUS | 0 refills | Status: DC

## 2022-08-28 NOTE — ED Provider Notes (Signed)
[facility] EMERGENCY DEPARTMENT Provider Note  CSN: [id number] Arrival date & time: 08/27/22 2244  Chief Complaint(s) Fall  HPI Katie [name] is a 56 y.o. female with a past medical history listed below including prior PEs on Xarelto who presents to the emergency department after mechanical fall that occurred 2 days ago.  She reports that her left knee gave out on her while she was walking.  This caused her to fall onto her right side.  Patient is unsure whether she hit her head.  She is complaining of right-sided hip pain.  Mild headache.  Mild neck pain.  No other physical complaints.  The history is provided by the patient.    Past Medical History Past Medical History:  Diagnosis Date   Anxiety    Back pain    Bipolar 1 disorder (HCC)    Chronic pain entered 08/15/2015   Treated with Oxycodone   Depression    Depression    Dyspnea    History of acute bronchitis    HTN (hypertension)    OA (osteoarthritis)    Obesity    PE (pulmonary embolism)    oct 2014   Sciatica    Patient Active Problem List   Diagnosis Date Noted   Allergic reaction caused by a drug 07/26/2021   Primary osteoarthritis of left knee 12/29/2019   Morbid obesity (HCC)    Bradycardia    Pulmonary embolism on long-term anticoagulation therapy (HCC) 04/06/2016   Pain in the chest    Chronic pain    Chest wall pain    Pulmonary embolism (HCC) 07/31/2013   Vocal cord dysfunction 02/28/2012   Dyspnea 01/17/2012   Chest pain 11/25/2011   Hypokalemia 11/25/2011   GERD (gastroesophageal reflux disease) 03/08/2011   DYSMENORRHEA 11/10/2010   PLANTAR FASCIITIS 09/14/2010   OSTEOARTHRITIS, KNEES, BILATERAL, 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 01/03/2007   Irritable bowel syndrome 01/03/2007   Home Medication(s) Prior to Admission medications   Medication Sig Start Date End Date Taking? Authorizing Provider  aspirin 81 MG chewable tablet Chew 81 mg by mouth daily.    [provider]  diazepam (VALIUM) 10 MG tablet Take 10 mg by mouth 2 (two) times daily. 06/25/21   [provider]  diphenhydrAMINE (BENADRYL) 25 MG tablet Take 1 tablet (25 mg total) by mouth every 6 (six) hours as needed for itching. Patient not taking: Reported on 04/25/2022 07/27/21   Burnadette Pop, MD  famotidine (PEPCID) 20 MG tablet Take 1 tablet (20 mg total) by mouth daily for 3 days. Patient not taking: Reported on 11/16/2021 07/27/21 07/30/21  Burnadette Pop, MD  furosemide (LASIX) 40 MG tablet Take 1 tablet (40 mg total) by mouth daily. 08/02/13   Zannie Cove, MD  gabapentin (NEURONTIN) 300 MG capsule Take 2 capsules (600 mg total) by mouth 3 (three) times daily. Patient not taking: Reported on 11/16/2021 05/01/21 07/26/21  Gerhard Munch, MD  lidocaine (LIDODERM) 5 % Place 1 patch onto the skin daily. Remove & Discard patch within 12 hours or as directed by MD 08/28/22   Nira Conn, MD  meclizine (ANTIVERT) 25 MG tablet Take 25 mg by mouth every 6 (six) hours as  needed for dizziness. 09/23/21   [provider]  methocarbamol (ROBAXIN) 500 MG tablet Take 2 tablets (1,000 mg total) by mouth every 8 (eight) hours as needed for muscle spasms. 08/28/22   Nira Connardama, Dolores Ewing Eduardo, MD  omeprazole (PRILOSEC) 40 MG capsule Take 40 mg by mouth daily.    [provider]  oxyCODONE (ROXICODONE) 5 MG immediate release tablet Take 1 tablet (5 mg total) by mouth every 6 (six) hours as needed for severe pain. 06/20/22   Pricilla LovelessGoldston, Scott, MD  phentermine (ADIPEX-P) 37.5 MG tablet Take 37.5 mg by mouth every morning.     [provider]  potassium chloride SA (K-DUR,KLOR-CON)  20 MEQ tablet Take 20 mEq by mouth 2 (two) times daily. 05/03/16   [provider]  Rivaroxaban (XARELTO) 20 MG TABS tablet Take 1 tablet (20 mg total) by mouth daily with supper. Starting 10/18, take 20mg  daily Patient taking differently: Take 20 mg by mouth daily with supper. 08/23/13   Zannie CoveJoseph, Preetha, MD                                                                                                                                    Allergies Contrast media [iodinated contrast media], Percocet [oxycodone-acetaminophen], Toradol [ketorolac tromethamine], Tylenol [acetaminophen], and Vicodin [hydrocodone-acetaminophen]  Review of Systems Review of Systems As noted in HPI  Physical Exam Vital Signs  I have reviewed the triage vital signs BP 125/76 (BP Location: Left Arm)   Pulse 62   Temp 98.5 F (36.9 C) (Oral)   Resp 16   LMP 12/28/2015 (Exact Date)   SpO2 100%   Physical Exam Constitutional:      General: She is not in acute distress.    Appearance: She is well-developed. She is not diaphoretic.  HENT:     Head: Normocephalic and atraumatic.     Right Ear: External ear normal.     Left Ear: External ear normal.     Nose: Nose normal.  Eyes:     General: No scleral icterus.       Right eye: No discharge.        Left eye: No discharge.     Conjunctiva/sclera: Conjunctivae normal.     Pupils: Pupils are equal, round, and reactive to light.  Cardiovascular:     Rate and Rhythm: Normal rate and regular rhythm.     Pulses:          Radial pulses are 2+ on the right side and 2+ on the left side.       Dorsalis pedis pulses are 2+ on the right side and 2+ on the left side.     Heart sounds: Normal heart sounds. No murmur heard.    No friction rub. No gallop.  Pulmonary:     Effort: Pulmonary effort is normal. No respiratory distress.     Breath sounds: Normal breath sounds. No stridor. No wheezing.  Abdominal:     General: There is no distension.     Palpations:  Abdomen is soft.     Tenderness: There is no abdominal tenderness.  Musculoskeletal:     Cervical back: Normal range of motion and neck supple. No bony tenderness.     Thoracic back: No bony tenderness.     Lumbar back: No bony tenderness.     Right hip: Tenderness present.     Comments: Clavicles stable. Chest stable to AP/Lat compression. Pelvis stable to Lat compression. No obvious extremity deformity. No chest or abdominal wall contusion.  Skin:    General: Skin is warm and dry.     Findings: No erythema or rash.  Neurological:     Mental Status: She is alert and oriented to person, place, and time.     Comments: Moving all extremities     ED Results and Treatments Labs (all labs ordered are listed, but only abnormal results are displayed) Labs Reviewed  BASIC METABOLIC PANEL - Abnormal; Notable for the following components:      Result Value   Glucose, Bld 104 (*)    Calcium 8.8 (*)    All other components within normal limits  CBC WITH DIFFERENTIAL/PLATELET                                                                                                                         EKG  EKG Interpretation  Date/Time:    Ventricular Rate:    PR Interval:    QRS Duration:   QT Interval:    QTC Calculation:   R Axis:     Text Interpretation:         Radiology CT HEAD WO CONTRAST ( )  Result Date: 08/28/2022 CLINICAL DATA:  Trauma. EXAM: CT HEAD WITHOUT CONTRAST CT CERVICAL SPINE WITHOUT CONTRAST TECHNIQUE: Multidetector CT imaging of the head and cervical spine was performed following the standard protocol without intravenous contrast. Multiplanar CT image reconstructions of the cervical spine were also generated. RADIATION DOSE REDUCTION: This exam was performed according to the departmental dose-optimization program which includes automated exposure control, adjustment of the mA and/or kV according to patient size and/or use of iterative reconstruction technique.  COMPARISON:  Head CT dated 08/23/2021. FINDINGS: CT HEAD FINDINGS Brain: The ventricles and sulci are appropriate size for the patient's age. The gray-white matter discrimination is preserved. There is no acute intracranial hemorrhage. No mass effect or midline shift. No extra-axial fluid collection. Vascular: No hyperdense vessel or unexpected calcification. Skull: Normal. Negative for fracture or focal lesion. Sinuses/Orbits: No acute finding. Other: None CT CERVICAL SPINE FINDINGS Alignment: No acute subluxation. Skull base and vertebrae: No acute fracture. Soft tissues and spinal canal: No prevertebral fluid or swelling. No visible canal hematoma. Disc levels: No acute findings. Mild degenerative changes at C4-C5. Upper chest: Negative. Other: None IMPRESSION: 1. No acute intracranial pathology. 2. No acute/traumatic cervical spine pathology. Electronically Signed   By: Ceasar Mons.D.  On: 08/28/2022 00:53   CT Cervical Spine Wo Contrast  Result Date: 08/28/2022 CLINICAL DATA:  Trauma. EXAM: CT HEAD WITHOUT CONTRAST CT CERVICAL SPINE WITHOUT CONTRAST TECHNIQUE: Multidetector CT imaging of the head and cervical spine was performed following the standard protocol without intravenous contrast. Multiplanar CT image reconstructions of the cervical spine were also generated. RADIATION DOSE REDUCTION: This exam was performed according to the departmental dose-optimization program which includes automated exposure control, adjustment of the mA and/or kV according to patient size and/or use of iterative reconstruction technique. COMPARISON:  Head CT dated 08/23/2021. FINDINGS: CT HEAD FINDINGS Brain: The ventricles and sulci are appropriate size for the patient's age. The gray-white matter discrimination is preserved. There is no acute intracranial hemorrhage. No mass effect or midline shift. No extra-axial fluid collection. Vascular: No hyperdense vessel or unexpected calcification. Skull: Normal. Negative  for fracture or focal lesion. Sinuses/Orbits: No acute finding. Other: None CT CERVICAL SPINE FINDINGS Alignment: No acute subluxation. Skull base and vertebrae: No acute fracture. Soft tissues and spinal canal: No prevertebral fluid or swelling. No visible canal hematoma. Disc levels: No acute findings. Mild degenerative changes at C4-C5. Upper chest: Negative. Other: None IMPRESSION: 1. No acute intracranial pathology. 2. No acute/traumatic cervical spine pathology. Electronically Signed   By: Anner Crete M.D.   On: 08/28/2022 00:53   DG Hip Unilat W or Wo Pelvis 2-3 Views Right  Result Date: 08/28/2022 CLINICAL DATA:  Fall, right hip pain EXAM: DG HIP (WITH OR WITHOUT PELVIS) 2-3V RIGHT COMPARISON:  None Available. FINDINGS: There is no evidence of hip fracture or dislocation. There is no evidence of arthropathy or other focal bone abnormality. IMPRESSION: Negative. Electronically Signed   By: Fidela Salisbury M.D.   On: 08/28/2022 00:07    Medications Ordered in ED Medications  methocarbamol (ROBAXIN) tablet 1,000 mg (1,000 mg Oral Given 08/28/22 0324)  traMADol (ULTRAM) tablet 50 mg (50 mg Oral Given 08/28/22 0324)                                                                                                                                     Procedures Procedures  (including critical care time)  Medical Decision Making / ED Course   Medical Decision Making Amount and/or Complexity of Data Reviewed Labs: ordered. Decision-making details documented in ED Course. Radiology: ordered and independent interpretation performed. Decision-making details documented in ED Course.  Risk Prescription drug management.    Patient presented after mechanical fall. Anticoagulated. We will obtain imaging to assess for any acute injuries including ICH and cervical injuries.  We will also get a plain film of the right hip. We will get labs in case patient requires admission and to assess  hemoglobin.  CT head and cervical spine negative for acute injuries. Plain film of the right hip negative for any acute injuries. CBC without leukocytosis or anemia Metabolic panel without significant electrolyte derangement or renal insufficiency  Likely soft tissue injuries No need for admission Recommended supportive management      Final Clinical Impression(s) / ED Diagnoses Final diagnoses:  Fall, initial encounter  Multiple contusions   The patient appears reasonably screened and/or stabilized for discharge and I doubt any other medical condition or other [facility] requiring further screening, evaluation, or treatment in the ED at this time. I have discussed the findings, Dx and Tx plan with the patient/family who expressed understanding and agree(s) with the plan. Discharge instructions discussed at length. The patient/family was given strict return precautions who verbalized understanding of the instructions. No further questions at time of discharge.  Disposition: Discharge  Condition: Good  ED Discharge Orders          Ordered    methocarbamol (ROBAXIN) 500 MG tablet  Every 8 hours PRN        08/28/22 0320    lidocaine (LIDODERM) 5 %  Every 24 hours        08/28/22 0320            Follow Up: Rometta Emery, MD [address] [place] [address] [phone]  Call  to schedule an appointment for close follow up           This chart was dictated using voice recognition software.  Despite best efforts to proofread,  errors can occur which can change the documentation meaning.    Nira Conn, MD 08/28/22 [phone]

## 2022-08-28 NOTE — Discharge Instructions (Signed)
You may use over-the-counter topical muscle creams such as SalonPas, [employer], Bengay, etc. Please stretch, apply ice or heat (whichever helps), and have massage therapy for additional assistance.

## 2022-08-29 DIAGNOSIS — M1712 Unilateral primary osteoarthritis, left knee: Secondary | ICD-10-CM | POA: Diagnosis not present

## 2022-08-30 DIAGNOSIS — M1712 Unilateral primary osteoarthritis, left knee: Secondary | ICD-10-CM | POA: Diagnosis not present

## 2022-08-31 DIAGNOSIS — M1712 Unilateral primary osteoarthritis, left knee: Secondary | ICD-10-CM | POA: Diagnosis not present

## 2022-09-01 DIAGNOSIS — M1712 Unilateral primary osteoarthritis, left knee: Secondary | ICD-10-CM | POA: Diagnosis not present

## 2022-09-02 DIAGNOSIS — M1712 Unilateral primary osteoarthritis, left knee: Secondary | ICD-10-CM | POA: Diagnosis not present

## 2022-09-03 DIAGNOSIS — M1712 Unilateral primary osteoarthritis, left knee: Secondary | ICD-10-CM | POA: Diagnosis not present

## 2022-09-04 DIAGNOSIS — M1712 Unilateral primary osteoarthritis, left knee: Secondary | ICD-10-CM | POA: Diagnosis not present

## 2022-09-05 DIAGNOSIS — M1712 Unilateral primary osteoarthritis, left knee: Secondary | ICD-10-CM | POA: Diagnosis not present

## 2022-09-06 DIAGNOSIS — M1712 Unilateral primary osteoarthritis, left knee: Secondary | ICD-10-CM | POA: Diagnosis not present

## 2022-09-07 DIAGNOSIS — M1712 Unilateral primary osteoarthritis, left knee: Secondary | ICD-10-CM | POA: Diagnosis not present

## 2022-09-08 DIAGNOSIS — M1712 Unilateral primary osteoarthritis, left knee: Secondary | ICD-10-CM | POA: Diagnosis not present

## 2022-09-09 DIAGNOSIS — M1712 Unilateral primary osteoarthritis, left knee: Secondary | ICD-10-CM | POA: Diagnosis not present

## 2022-09-10 DIAGNOSIS — M1712 Unilateral primary osteoarthritis, left knee: Secondary | ICD-10-CM | POA: Diagnosis not present

## 2022-09-11 DIAGNOSIS — M1712 Unilateral primary osteoarthritis, left knee: Secondary | ICD-10-CM | POA: Diagnosis not present

## 2022-09-12 DIAGNOSIS — M1712 Unilateral primary osteoarthritis, left knee: Secondary | ICD-10-CM | POA: Diagnosis not present

## 2022-09-13 DIAGNOSIS — M1712 Unilateral primary osteoarthritis, left knee: Secondary | ICD-10-CM | POA: Diagnosis not present

## 2022-09-14 DIAGNOSIS — M1712 Unilateral primary osteoarthritis, left knee: Secondary | ICD-10-CM | POA: Diagnosis not present

## 2022-09-15 DIAGNOSIS — M1712 Unilateral primary osteoarthritis, left knee: Secondary | ICD-10-CM | POA: Diagnosis not present

## 2022-09-16 DIAGNOSIS — M1712 Unilateral primary osteoarthritis, left knee: Secondary | ICD-10-CM | POA: Diagnosis not present

## 2022-09-17 ENCOUNTER — Emergency Department (HOSPITAL_COMMUNITY): Payer: Medicaid Other

## 2022-09-17 ENCOUNTER — Emergency Department (HOSPITAL_COMMUNITY)
Admission: EM | Admit: 2022-09-17 | Discharge: 2022-09-18 | Disposition: A | Discharge status: Home / Self Care | Payer: Medicaid Other | Attending: Emergency Medicine | Admitting: Emergency Medicine

## 2022-09-17 DIAGNOSIS — Z7982 Long term (current) use of aspirin: Secondary | ICD-10-CM | POA: Diagnosis not present

## 2022-09-17 DIAGNOSIS — M25551 Pain in right hip: Secondary | ICD-10-CM | POA: Insufficient documentation

## 2022-09-17 DIAGNOSIS — Z7901 Long term (current) use of anticoagulants: Secondary | ICD-10-CM | POA: Insufficient documentation

## 2022-09-17 DIAGNOSIS — M1712 Unilateral primary osteoarthritis, left knee: Secondary | ICD-10-CM | POA: Diagnosis not present

## 2022-09-17 DIAGNOSIS — I10 Essential (primary) hypertension: Secondary | ICD-10-CM | POA: Insufficient documentation

## 2022-09-17 MED ORDER — FENTANYL CITRATE PF 50 MCG/ML IJ SOSY
50.0000 ug | PREFILLED_SYRINGE | Freq: Once | INTRAMUSCULAR | Status: AC
  Administered 2022-09-17: 50 ug via INTRAMUSCULAR
  Filled 2022-09-17: qty 1

## 2022-09-17 NOTE — ED Provider Notes (Signed)
[facility] EMERGENCY DEPARTMENT Provider Note   CSN: [id number] Arrival date & time: 09/17/22  1854     History  Chief Complaint  Patient presents with   Hip Pain    Katie [name] is a 56 y.o. female with medical history of anxiety, back pain, bipolar 1 disorder, chronic pain, hypertension, obesity, sciatica.  Patient presents to ED for evaluation of right hip pain.  Patient reports that yesterday she was "rushing to the bathroom, unzipped her pants and heard a pop on the right side".  Patient denies falling onto the right hip, patient denies falling at all.  Patient reports that she has had increasing right hip pain since this time.  Patient states that her mobility has been limited as a result.  The patient denies any trauma to the hip.  Patient reports he has a history of sciatica, is having some right-sided low back pain.  Patient denies any red flag symptoms of low back pain, fevers, nausea, vomiting.   Hip Pain       Home Medications Prior to Admission medications   Medication Sig Start Date End Date Taking? Authorizing Provider  aspirin 81 MG chewable tablet Chew 81 mg by mouth daily.    [provider]  diazepam (VALIUM) 10 MG tablet Take 10 mg by mouth 2 (two) times daily. 06/25/21   [provider]  diphenhydrAMINE (BENADRYL) 25 MG tablet Take 1 tablet (25 mg total) by mouth every 6 (six) hours as needed for itching. Patient not taking: Reported on 04/25/2022 07/27/21   Burnadette Pop, MD  famotidine (PEPCID) 20 MG tablet Take 1 tablet (20 mg total) by mouth daily for 3 days. Patient not taking: Reported on 11/16/2021 07/27/21 07/30/21  Burnadette Pop, MD  furosemide (LASIX) 40 MG tablet Take 1 tablet (40 mg total) by mouth daily. 08/02/13   Zannie Cove, MD  gabapentin (NEURONTIN) 300 MG capsule Take 2 capsules (600 mg total) by mouth 3 (three) times daily. Patient not taking: Reported on 11/16/2021 05/01/21 07/26/21  Gerhard Munch, MD   lidocaine (LIDODERM) 5 % Place 1 patch onto the skin daily. Remove & Discard patch within 12 hours or as directed by MD 08/28/22   Nira Conn, MD  meclizine (ANTIVERT) 25 MG tablet Take 25 mg by mouth every 6 (six) hours as needed for dizziness. 09/23/21   [provider]  methocarbamol (ROBAXIN) 500 MG tablet Take 2 tablets (1,000 mg total) by mouth every 8 (eight) hours as needed for muscle spasms. 08/28/22   Nira Conn, MD  omeprazole (PRILOSEC) 40 MG capsule Take 40 mg by mouth daily.    [provider]  oxyCODONE (ROXICODONE) 5 MG immediate release tablet Take 1 tablet (5 mg total) by mouth every 6 (six) hours as needed for severe pain. 06/20/22   Pricilla Loveless, MD  phentermine (ADIPEX-P) 37.5 MG tablet Take 37.5 mg by mouth every morning.     [provider]  potassium chloride SA (K-DUR,KLOR-CON) 20 MEQ tablet Take 20 mEq by mouth 2 (two) times daily. 05/03/16   [provider]  Rivaroxaban (XARELTO) 20 MG TABS tablet Take 1 tablet (20 mg total) by mouth daily with supper. Starting 10/18, take 20mg  daily Patient taking differently: Take 20 mg by mouth daily with supper. 08/23/13   08/25/13, MD      Allergies    Contrast media [iodinated contrast media], Percocet [oxycodone-acetaminophen], Toradol [ketorolac tromethamine], Tylenol [acetaminophen], and Vicodin [hydrocodone-acetaminophen]    Review  of Systems   Review of Systems  Constitutional:  Negative for fever.  Gastrointestinal:  Negative for nausea and vomiting.  Musculoskeletal:  Positive for arthralgias.  All other systems reviewed and are negative.   Physical Exam Updated Vital Signs BP (!) 140/80 (BP Location: Left Arm)   Pulse (!) 54   Temp 98.3 F (36.8 C) (Oral)   Resp 12   Ht 5\' 5"  (1.651 m)   Wt 117.9 kg   LMP 12/28/2015 (Exact Date)   SpO2 99%   BMI 43.27 kg/m  Physical Exam Vitals and nursing note reviewed.  Constitutional:      General:  She is not in acute distress.    Appearance: Normal appearance. She is not ill-appearing, toxic-appearing or diaphoretic.  HENT:     Head: Normocephalic and atraumatic.     Nose: Nose normal. No congestion.     Mouth/Throat:     Mouth: Mucous membranes are moist.     Pharynx: Oropharynx is clear.  Eyes:     Extraocular Movements: Extraocular movements intact.     Conjunctiva/sclera: Conjunctivae normal.     Pupils: Pupils are equal, round, and reactive to light.  Cardiovascular:     Rate and Rhythm: Normal rate and regular rhythm.  Pulmonary:     Effort: Pulmonary effort is normal.     Breath sounds: Normal breath sounds. No wheezing.  Abdominal:     General: Abdomen is flat. Bowel sounds are normal.     Palpations: Abdomen is soft.     Tenderness: There is no abdominal tenderness.  Musculoskeletal:     Cervical back: Normal range of motion and neck supple. No tenderness.     Right hip: Tenderness present. No deformity or lacerations. Decreased range of motion. Normal strength.     Comments: Patient left hip with no overlying skin changes, no obvious deformity.  The patient is able to lift her left leg into the air without issue.  Patient has 5 out of 5 strength right lower extremity.  No shortening or rotation.  Patient does have decreased range of motion secondary to pain.  Skin:    General: Skin is warm and dry.     Capillary Refill: Capillary refill takes less than 2 seconds.  Neurological:     Mental Status: She is alert and oriented to person, place, and time.     ED Results / Procedures / Treatments   Labs (all labs ordered are listed, but only abnormal results are displayed) Labs Reviewed - No data to display  EKG None  Radiology CT Hip Right Wo Contrast  Result Date: 09/17/2022 CLINICAL DATA:  Hip pain, stress fracture suspected. Negative x-rays. EXAM: CT OF THE RIGHT HIP WITHOUT CONTRAST TECHNIQUE: Multidetector CT imaging of the right hip was performed  according to the standard protocol. Multiplanar CT image reconstructions were also generated. RADIATION DOSE REDUCTION: This exam was performed according to the departmental dose-optimization program which includes automated exposure control, adjustment of the mA and/or kV according to patient size and/or use of iterative reconstruction technique. COMPARISON:  None Available. FINDINGS: Bones/Joint/Cartilage No acute fracture or dislocation. Mild superolateral hip joint space narrowing. No appreciable joint effusion. Ligaments Suboptimally assessed by CT. Muscles and Tendons Muscles are normal in bulk. No intramuscular hematoma or fluid collection. Tendons are intact. Soft tissues Small subcutaneous soft tissue hematoma adjacent to the right gluteus muscles measuring approximately 2.8 x 1.8 cm with surrounding edema. IMPRESSION: 1. No acute fracture or dislocation. 2. Small subcutaneous soft  tissue hematoma adjacent to the right gluteus muscles with surrounding edema. Electronically Signed   By: Larose Hires D.O.   On: 09/17/2022 22:19   DG Hip Unilat W or Wo Pelvis 2-3 Views Right  Result Date: 09/17/2022 CLINICAL DATA:  Right hip pain following fall. EXAM: DG HIP (WITH OR WITHOUT PELVIS) 2-3V RIGHT COMPARISON:  08/27/2022. FINDINGS: Examination is suboptimal due to technique and patient's body habitus. There is no obvious hip fracture or dislocation. There is no evidence of arthropathy or other focal bone abnormality. IMPRESSION: Suboptimal imaging.  No obvious acute fracture or dislocation. Electronically Signed   By: Thornell Sartorius M.D.   On: 09/17/2022 20:47    Procedures Procedures   Medications Ordered in ED Medications  fentaNYL (SUBLIMAZE) injection 50 mcg (50 mcg Intramuscular Given 09/17/22 2157)    ED Course/ Medical Decision Making/ A&P                           Medical Decision Making Amount and/or Complexity of Data Reviewed Radiology: ordered.  Risk Prescription drug  management.   56 year old female presents to ED for evaluation of right hip pain.  Please see HPI for further details.  On examination the patient right hip has no overlying skin change, deformity.  The patient has 5 out of 5 strength in her right lower extremity.  The patient has a 2+ DP pulse.  The patient has no shortening or rotation on examination.  Patient plain film imaging of right hip does not show any acute fracture however this is a suboptimal image due to patient body habitus and technique.  Because of this, and increased pain, we have decided to proceed with CT imaging of right hip.  Patient provided 50 mcg of fentanyl for pain control.  Patient CT scan of right hip shows hematoma, soft tissue swelling however no acute fracture.  Patient will be discharged home at this time and advised to follow-up with her PCP for further management.  Patient be advised to treat her pain at home conservatively with Tylenol, ice.  Return precautions provided, patient voiced understanding.  Patient had all her questions answered to her satisfaction.  The patient stable at this time for discharge home.  Final Clinical Impression(s) / ED Diagnoses Final diagnoses:  Right hip pain    Rx / DC Orders ED Discharge Orders     None         Al Decant, PA-C 09/17/22 2252    Vanetta Mulders, MD 09/20/22 [phone]

## 2022-09-17 NOTE — ED Notes (Signed)
Pt to CT

## 2022-09-17 NOTE — ED Triage Notes (Signed)
Pt arrives via [facility] from home for hip pain. States that she was "rushing to the bathroom, unzipped her pants and heard a pop on the R side". Pt denies fall. States that she was able to make it to bed afterwards but has been limited with mobility since d/t pain. PMS intact.

## 2022-09-17 NOTE — Discharge Instructions (Addendum)
Return to the ED with any new or worsening signs or symptoms Please follow-up with your primary care doctor for further management Please treat your symptoms conservatively at home with ice packs, analgesics

## 2022-09-18 DIAGNOSIS — M1712 Unilateral primary osteoarthritis, left knee: Secondary | ICD-10-CM | POA: Diagnosis not present

## 2022-09-18 NOTE — ED Notes (Signed)
Went to d/c pt. Unable to locate pt. NT states pt had discharge papers when she walked out.

## 2022-09-19 DIAGNOSIS — M1712 Unilateral primary osteoarthritis, left knee: Secondary | ICD-10-CM | POA: Diagnosis not present

## 2022-09-20 DIAGNOSIS — M1712 Unilateral primary osteoarthritis, left knee: Secondary | ICD-10-CM | POA: Diagnosis not present

## 2022-09-21 DIAGNOSIS — M1712 Unilateral primary osteoarthritis, left knee: Secondary | ICD-10-CM | POA: Diagnosis not present

## 2022-09-22 DIAGNOSIS — M1712 Unilateral primary osteoarthritis, left knee: Secondary | ICD-10-CM | POA: Diagnosis not present

## 2022-09-23 DIAGNOSIS — M1712 Unilateral primary osteoarthritis, left knee: Secondary | ICD-10-CM | POA: Diagnosis not present

## 2022-09-24 DIAGNOSIS — M1712 Unilateral primary osteoarthritis, left knee: Secondary | ICD-10-CM | POA: Diagnosis not present

## 2022-09-25 DIAGNOSIS — M1712 Unilateral primary osteoarthritis, left knee: Secondary | ICD-10-CM | POA: Diagnosis not present

## 2022-09-26 DIAGNOSIS — M1712 Unilateral primary osteoarthritis, left knee: Secondary | ICD-10-CM | POA: Diagnosis not present

## 2022-09-27 DIAGNOSIS — M1712 Unilateral primary osteoarthritis, left knee: Secondary | ICD-10-CM | POA: Diagnosis not present

## 2022-09-29 DIAGNOSIS — M1712 Unilateral primary osteoarthritis, left knee: Secondary | ICD-10-CM | POA: Diagnosis not present

## 2022-09-30 DIAGNOSIS — M1712 Unilateral primary osteoarthritis, left knee: Secondary | ICD-10-CM | POA: Diagnosis not present

## 2022-10-01 DIAGNOSIS — M1712 Unilateral primary osteoarthritis, left knee: Secondary | ICD-10-CM | POA: Diagnosis not present

## 2022-10-02 DIAGNOSIS — M1712 Unilateral primary osteoarthritis, left knee: Secondary | ICD-10-CM | POA: Diagnosis not present

## 2022-10-03 DIAGNOSIS — M1712 Unilateral primary osteoarthritis, left knee: Secondary | ICD-10-CM | POA: Diagnosis not present

## 2022-10-04 DIAGNOSIS — M1712 Unilateral primary osteoarthritis, left knee: Secondary | ICD-10-CM | POA: Diagnosis not present

## 2022-10-05 DIAGNOSIS — M1712 Unilateral primary osteoarthritis, left knee: Secondary | ICD-10-CM | POA: Diagnosis not present

## 2022-10-06 DIAGNOSIS — M1712 Unilateral primary osteoarthritis, left knee: Secondary | ICD-10-CM | POA: Diagnosis not present

## 2022-10-07 DIAGNOSIS — M1712 Unilateral primary osteoarthritis, left knee: Secondary | ICD-10-CM | POA: Diagnosis not present

## 2022-10-08 DIAGNOSIS — M1712 Unilateral primary osteoarthritis, left knee: Secondary | ICD-10-CM | POA: Diagnosis not present

## 2022-10-09 DIAGNOSIS — M1712 Unilateral primary osteoarthritis, left knee: Secondary | ICD-10-CM | POA: Diagnosis not present

## 2022-10-10 DIAGNOSIS — M1712 Unilateral primary osteoarthritis, left knee: Secondary | ICD-10-CM | POA: Diagnosis not present

## 2022-10-11 DIAGNOSIS — M1712 Unilateral primary osteoarthritis, left knee: Secondary | ICD-10-CM | POA: Diagnosis not present

## 2022-10-12 DIAGNOSIS — M1712 Unilateral primary osteoarthritis, left knee: Secondary | ICD-10-CM | POA: Diagnosis not present

## 2022-10-13 DIAGNOSIS — M1712 Unilateral primary osteoarthritis, left knee: Secondary | ICD-10-CM | POA: Diagnosis not present

## 2022-10-14 DIAGNOSIS — M1712 Unilateral primary osteoarthritis, left knee: Secondary | ICD-10-CM | POA: Diagnosis not present

## 2022-10-15 DIAGNOSIS — M1712 Unilateral primary osteoarthritis, left knee: Secondary | ICD-10-CM | POA: Diagnosis not present

## 2022-10-16 DIAGNOSIS — M1712 Unilateral primary osteoarthritis, left knee: Secondary | ICD-10-CM | POA: Diagnosis not present

## 2022-10-17 DIAGNOSIS — M1712 Unilateral primary osteoarthritis, left knee: Secondary | ICD-10-CM | POA: Diagnosis not present

## 2022-10-18 DIAGNOSIS — M1712 Unilateral primary osteoarthritis, left knee: Secondary | ICD-10-CM | POA: Diagnosis not present

## 2022-10-19 DIAGNOSIS — M1712 Unilateral primary osteoarthritis, left knee: Secondary | ICD-10-CM | POA: Diagnosis not present

## 2022-10-20 DIAGNOSIS — M1712 Unilateral primary osteoarthritis, left knee: Secondary | ICD-10-CM | POA: Diagnosis not present

## 2022-10-21 DIAGNOSIS — M1712 Unilateral primary osteoarthritis, left knee: Secondary | ICD-10-CM | POA: Diagnosis not present

## 2022-10-22 DIAGNOSIS — M1712 Unilateral primary osteoarthritis, left knee: Secondary | ICD-10-CM | POA: Diagnosis not present

## 2022-10-23 DIAGNOSIS — M1712 Unilateral primary osteoarthritis, left knee: Secondary | ICD-10-CM | POA: Diagnosis not present

## 2022-10-24 DIAGNOSIS — M1712 Unilateral primary osteoarthritis, left knee: Secondary | ICD-10-CM | POA: Diagnosis not present

## 2022-10-25 DIAGNOSIS — M1712 Unilateral primary osteoarthritis, left knee: Secondary | ICD-10-CM | POA: Diagnosis not present

## 2022-10-26 DIAGNOSIS — M1712 Unilateral primary osteoarthritis, left knee: Secondary | ICD-10-CM | POA: Diagnosis not present

## 2022-10-27 DIAGNOSIS — M1712 Unilateral primary osteoarthritis, left knee: Secondary | ICD-10-CM | POA: Diagnosis not present

## 2022-10-28 DIAGNOSIS — M1712 Unilateral primary osteoarthritis, left knee: Secondary | ICD-10-CM | POA: Diagnosis not present

## 2022-10-29 DIAGNOSIS — M1712 Unilateral primary osteoarthritis, left knee: Secondary | ICD-10-CM | POA: Diagnosis not present

## 2022-10-31 DIAGNOSIS — M1712 Unilateral primary osteoarthritis, left knee: Secondary | ICD-10-CM | POA: Diagnosis not present

## 2022-11-01 DIAGNOSIS — M1712 Unilateral primary osteoarthritis, left knee: Secondary | ICD-10-CM | POA: Diagnosis not present

## 2022-11-02 DIAGNOSIS — M1712 Unilateral primary osteoarthritis, left knee: Secondary | ICD-10-CM | POA: Diagnosis not present

## 2022-11-03 DIAGNOSIS — M1712 Unilateral primary osteoarthritis, left knee: Secondary | ICD-10-CM | POA: Diagnosis not present

## 2022-11-04 DIAGNOSIS — M1712 Unilateral primary osteoarthritis, left knee: Secondary | ICD-10-CM | POA: Diagnosis not present

## 2022-11-05 DIAGNOSIS — M1712 Unilateral primary osteoarthritis, left knee: Secondary | ICD-10-CM | POA: Diagnosis not present

## 2022-11-06 DIAGNOSIS — M1712 Unilateral primary osteoarthritis, left knee: Secondary | ICD-10-CM | POA: Diagnosis not present

## 2022-11-07 DIAGNOSIS — M1712 Unilateral primary osteoarthritis, left knee: Secondary | ICD-10-CM | POA: Diagnosis not present

## 2022-11-08 DIAGNOSIS — M1712 Unilateral primary osteoarthritis, left knee: Secondary | ICD-10-CM | POA: Diagnosis not present

## 2022-11-09 DIAGNOSIS — M1712 Unilateral primary osteoarthritis, left knee: Secondary | ICD-10-CM | POA: Diagnosis not present

## 2022-11-10 DIAGNOSIS — M1712 Unilateral primary osteoarthritis, left knee: Secondary | ICD-10-CM | POA: Diagnosis not present

## 2022-11-11 DIAGNOSIS — M1712 Unilateral primary osteoarthritis, left knee: Secondary | ICD-10-CM | POA: Diagnosis not present

## 2022-11-12 DIAGNOSIS — M1712 Unilateral primary osteoarthritis, left knee: Secondary | ICD-10-CM | POA: Diagnosis not present

## 2022-11-13 DIAGNOSIS — M1712 Unilateral primary osteoarthritis, left knee: Secondary | ICD-10-CM | POA: Diagnosis not present

## 2022-11-14 DIAGNOSIS — M1712 Unilateral primary osteoarthritis, left knee: Secondary | ICD-10-CM | POA: Diagnosis not present

## 2022-11-15 DIAGNOSIS — M1712 Unilateral primary osteoarthritis, left knee: Secondary | ICD-10-CM | POA: Diagnosis not present

## 2022-11-16 DIAGNOSIS — M1712 Unilateral primary osteoarthritis, left knee: Secondary | ICD-10-CM | POA: Diagnosis not present

## 2022-11-17 DIAGNOSIS — M1712 Unilateral primary osteoarthritis, left knee: Secondary | ICD-10-CM | POA: Diagnosis not present

## 2022-11-18 DIAGNOSIS — M1712 Unilateral primary osteoarthritis, left knee: Secondary | ICD-10-CM | POA: Diagnosis not present

## 2022-11-19 DIAGNOSIS — M1712 Unilateral primary osteoarthritis, left knee: Secondary | ICD-10-CM | POA: Diagnosis not present

## 2022-11-20 DIAGNOSIS — M1712 Unilateral primary osteoarthritis, left knee: Secondary | ICD-10-CM | POA: Diagnosis not present

## 2022-11-21 DIAGNOSIS — M1712 Unilateral primary osteoarthritis, left knee: Secondary | ICD-10-CM | POA: Diagnosis not present

## 2022-11-22 DIAGNOSIS — M1712 Unilateral primary osteoarthritis, left knee: Secondary | ICD-10-CM | POA: Diagnosis not present

## 2022-11-23 DIAGNOSIS — M1712 Unilateral primary osteoarthritis, left knee: Secondary | ICD-10-CM | POA: Diagnosis not present

## 2022-11-24 DIAGNOSIS — M1712 Unilateral primary osteoarthritis, left knee: Secondary | ICD-10-CM | POA: Diagnosis not present

## 2022-11-25 DIAGNOSIS — M1712 Unilateral primary osteoarthritis, left knee: Secondary | ICD-10-CM | POA: Diagnosis not present

## 2022-11-26 DIAGNOSIS — M1712 Unilateral primary osteoarthritis, left knee: Secondary | ICD-10-CM | POA: Diagnosis not present

## 2022-11-27 DIAGNOSIS — M1712 Unilateral primary osteoarthritis, left knee: Secondary | ICD-10-CM | POA: Diagnosis not present

## 2022-11-28 DIAGNOSIS — M1712 Unilateral primary osteoarthritis, left knee: Secondary | ICD-10-CM | POA: Diagnosis not present

## 2022-11-29 DIAGNOSIS — M1712 Unilateral primary osteoarthritis, left knee: Secondary | ICD-10-CM | POA: Diagnosis not present

## 2022-11-30 DIAGNOSIS — M1712 Unilateral primary osteoarthritis, left knee: Secondary | ICD-10-CM | POA: Diagnosis not present

## 2022-12-01 DIAGNOSIS — M1712 Unilateral primary osteoarthritis, left knee: Secondary | ICD-10-CM | POA: Diagnosis not present

## 2022-12-02 DIAGNOSIS — M1712 Unilateral primary osteoarthritis, left knee: Secondary | ICD-10-CM | POA: Diagnosis not present

## 2022-12-03 DIAGNOSIS — M1712 Unilateral primary osteoarthritis, left knee: Secondary | ICD-10-CM | POA: Diagnosis not present

## 2022-12-04 DIAGNOSIS — M1712 Unilateral primary osteoarthritis, left knee: Secondary | ICD-10-CM | POA: Diagnosis not present

## 2022-12-05 DIAGNOSIS — M1712 Unilateral primary osteoarthritis, left knee: Secondary | ICD-10-CM | POA: Diagnosis not present

## 2022-12-06 DIAGNOSIS — M1712 Unilateral primary osteoarthritis, left knee: Secondary | ICD-10-CM | POA: Diagnosis not present

## 2022-12-07 DIAGNOSIS — M1712 Unilateral primary osteoarthritis, left knee: Secondary | ICD-10-CM | POA: Diagnosis not present

## 2022-12-08 DIAGNOSIS — M1712 Unilateral primary osteoarthritis, left knee: Secondary | ICD-10-CM | POA: Diagnosis not present

## 2022-12-09 DIAGNOSIS — M1712 Unilateral primary osteoarthritis, left knee: Secondary | ICD-10-CM | POA: Diagnosis not present

## 2022-12-10 DIAGNOSIS — M1712 Unilateral primary osteoarthritis, left knee: Secondary | ICD-10-CM | POA: Diagnosis not present

## 2022-12-11 DIAGNOSIS — M1712 Unilateral primary osteoarthritis, left knee: Secondary | ICD-10-CM | POA: Diagnosis not present

## 2022-12-12 DIAGNOSIS — M1712 Unilateral primary osteoarthritis, left knee: Secondary | ICD-10-CM | POA: Diagnosis not present

## 2022-12-13 DIAGNOSIS — M1712 Unilateral primary osteoarthritis, left knee: Secondary | ICD-10-CM | POA: Diagnosis not present

## 2022-12-14 DIAGNOSIS — M1712 Unilateral primary osteoarthritis, left knee: Secondary | ICD-10-CM | POA: Diagnosis not present

## 2022-12-15 DIAGNOSIS — M1712 Unilateral primary osteoarthritis, left knee: Secondary | ICD-10-CM | POA: Diagnosis not present

## 2022-12-16 DIAGNOSIS — M1712 Unilateral primary osteoarthritis, left knee: Secondary | ICD-10-CM | POA: Diagnosis not present

## 2022-12-17 DIAGNOSIS — M1712 Unilateral primary osteoarthritis, left knee: Secondary | ICD-10-CM | POA: Diagnosis not present

## 2022-12-18 DIAGNOSIS — M1712 Unilateral primary osteoarthritis, left knee: Secondary | ICD-10-CM | POA: Diagnosis not present

## 2022-12-19 DIAGNOSIS — M1712 Unilateral primary osteoarthritis, left knee: Secondary | ICD-10-CM | POA: Diagnosis not present

## 2022-12-20 DIAGNOSIS — M1712 Unilateral primary osteoarthritis, left knee: Secondary | ICD-10-CM | POA: Diagnosis not present

## 2022-12-21 DIAGNOSIS — M1712 Unilateral primary osteoarthritis, left knee: Secondary | ICD-10-CM | POA: Diagnosis not present

## 2022-12-22 DIAGNOSIS — M1712 Unilateral primary osteoarthritis, left knee: Secondary | ICD-10-CM | POA: Diagnosis not present

## 2022-12-23 DIAGNOSIS — M1712 Unilateral primary osteoarthritis, left knee: Secondary | ICD-10-CM | POA: Diagnosis not present

## 2022-12-24 DIAGNOSIS — M1712 Unilateral primary osteoarthritis, left knee: Secondary | ICD-10-CM | POA: Diagnosis not present

## 2022-12-25 DIAGNOSIS — M1712 Unilateral primary osteoarthritis, left knee: Secondary | ICD-10-CM | POA: Diagnosis not present

## 2022-12-26 DIAGNOSIS — M1712 Unilateral primary osteoarthritis, left knee: Secondary | ICD-10-CM | POA: Diagnosis not present

## 2022-12-27 DIAGNOSIS — M1712 Unilateral primary osteoarthritis, left knee: Secondary | ICD-10-CM | POA: Diagnosis not present

## 2022-12-28 DIAGNOSIS — M1712 Unilateral primary osteoarthritis, left knee: Secondary | ICD-10-CM | POA: Diagnosis not present

## 2022-12-29 DIAGNOSIS — M1712 Unilateral primary osteoarthritis, left knee: Secondary | ICD-10-CM | POA: Diagnosis not present

## 2022-12-30 DIAGNOSIS — M1712 Unilateral primary osteoarthritis, left knee: Secondary | ICD-10-CM | POA: Diagnosis not present

## 2022-12-31 DIAGNOSIS — M1712 Unilateral primary osteoarthritis, left knee: Secondary | ICD-10-CM | POA: Diagnosis not present

## 2023-01-01 DIAGNOSIS — M1712 Unilateral primary osteoarthritis, left knee: Secondary | ICD-10-CM | POA: Diagnosis not present

## 2023-01-02 DIAGNOSIS — M1712 Unilateral primary osteoarthritis, left knee: Secondary | ICD-10-CM | POA: Diagnosis not present

## 2023-01-02 DIAGNOSIS — M25551 Pain in right hip: Secondary | ICD-10-CM | POA: Diagnosis not present

## 2023-01-03 DIAGNOSIS — M1712 Unilateral primary osteoarthritis, left knee: Secondary | ICD-10-CM | POA: Diagnosis not present

## 2023-01-04 DIAGNOSIS — M1712 Unilateral primary osteoarthritis, left knee: Secondary | ICD-10-CM | POA: Diagnosis not present

## 2023-01-05 DIAGNOSIS — M1712 Unilateral primary osteoarthritis, left knee: Secondary | ICD-10-CM | POA: Diagnosis not present

## 2023-01-06 DIAGNOSIS — M1712 Unilateral primary osteoarthritis, left knee: Secondary | ICD-10-CM | POA: Diagnosis not present

## 2023-01-07 DIAGNOSIS — M1712 Unilateral primary osteoarthritis, left knee: Secondary | ICD-10-CM | POA: Diagnosis not present

## 2023-01-08 DIAGNOSIS — M1712 Unilateral primary osteoarthritis, left knee: Secondary | ICD-10-CM | POA: Diagnosis not present

## 2023-01-08 DIAGNOSIS — M7061 Trochanteric bursitis, right hip: Secondary | ICD-10-CM | POA: Diagnosis not present

## 2023-01-09 DIAGNOSIS — M1712 Unilateral primary osteoarthritis, left knee: Secondary | ICD-10-CM | POA: Diagnosis not present

## 2023-01-10 DIAGNOSIS — M1712 Unilateral primary osteoarthritis, left knee: Secondary | ICD-10-CM | POA: Diagnosis not present

## 2023-01-11 DIAGNOSIS — M1712 Unilateral primary osteoarthritis, left knee: Secondary | ICD-10-CM | POA: Diagnosis not present

## 2023-01-12 DIAGNOSIS — M1712 Unilateral primary osteoarthritis, left knee: Secondary | ICD-10-CM | POA: Diagnosis not present

## 2023-01-13 DIAGNOSIS — M1712 Unilateral primary osteoarthritis, left knee: Secondary | ICD-10-CM | POA: Diagnosis not present

## 2023-01-14 DIAGNOSIS — M1712 Unilateral primary osteoarthritis, left knee: Secondary | ICD-10-CM | POA: Diagnosis not present

## 2023-01-15 DIAGNOSIS — M1712 Unilateral primary osteoarthritis, left knee: Secondary | ICD-10-CM | POA: Diagnosis not present

## 2023-01-16 DIAGNOSIS — M1712 Unilateral primary osteoarthritis, left knee: Secondary | ICD-10-CM | POA: Diagnosis not present

## 2023-01-17 DIAGNOSIS — M1712 Unilateral primary osteoarthritis, left knee: Secondary | ICD-10-CM | POA: Diagnosis not present

## 2023-01-18 DIAGNOSIS — M1712 Unilateral primary osteoarthritis, left knee: Secondary | ICD-10-CM | POA: Diagnosis not present

## 2023-01-19 DIAGNOSIS — M1712 Unilateral primary osteoarthritis, left knee: Secondary | ICD-10-CM | POA: Diagnosis not present

## 2023-01-20 DIAGNOSIS — M1712 Unilateral primary osteoarthritis, left knee: Secondary | ICD-10-CM | POA: Diagnosis not present

## 2023-01-21 DIAGNOSIS — M1712 Unilateral primary osteoarthritis, left knee: Secondary | ICD-10-CM | POA: Diagnosis not present

## 2023-01-22 DIAGNOSIS — M1712 Unilateral primary osteoarthritis, left knee: Secondary | ICD-10-CM | POA: Diagnosis not present

## 2023-01-23 DIAGNOSIS — M1712 Unilateral primary osteoarthritis, left knee: Secondary | ICD-10-CM | POA: Diagnosis not present

## 2023-01-24 DIAGNOSIS — M1712 Unilateral primary osteoarthritis, left knee: Secondary | ICD-10-CM | POA: Diagnosis not present

## 2023-01-25 DIAGNOSIS — M1712 Unilateral primary osteoarthritis, left knee: Secondary | ICD-10-CM | POA: Diagnosis not present

## 2023-01-26 DIAGNOSIS — M1712 Unilateral primary osteoarthritis, left knee: Secondary | ICD-10-CM | POA: Diagnosis not present

## 2023-01-27 DIAGNOSIS — M1712 Unilateral primary osteoarthritis, left knee: Secondary | ICD-10-CM | POA: Diagnosis not present

## 2023-01-28 DIAGNOSIS — M1712 Unilateral primary osteoarthritis, left knee: Secondary | ICD-10-CM | POA: Diagnosis not present

## 2023-01-29 DIAGNOSIS — M1712 Unilateral primary osteoarthritis, left knee: Secondary | ICD-10-CM | POA: Diagnosis not present

## 2023-01-30 DIAGNOSIS — M1712 Unilateral primary osteoarthritis, left knee: Secondary | ICD-10-CM | POA: Diagnosis not present

## 2023-01-31 DIAGNOSIS — M1712 Unilateral primary osteoarthritis, left knee: Secondary | ICD-10-CM | POA: Diagnosis not present

## 2023-02-01 DIAGNOSIS — M1712 Unilateral primary osteoarthritis, left knee: Secondary | ICD-10-CM | POA: Diagnosis not present

## 2023-02-02 DIAGNOSIS — M1712 Unilateral primary osteoarthritis, left knee: Secondary | ICD-10-CM | POA: Diagnosis not present

## 2023-02-03 DIAGNOSIS — M1712 Unilateral primary osteoarthritis, left knee: Secondary | ICD-10-CM | POA: Diagnosis not present

## 2023-02-04 DIAGNOSIS — M1712 Unilateral primary osteoarthritis, left knee: Secondary | ICD-10-CM | POA: Diagnosis not present

## 2023-02-05 DIAGNOSIS — M1712 Unilateral primary osteoarthritis, left knee: Secondary | ICD-10-CM | POA: Diagnosis not present

## 2023-02-06 DIAGNOSIS — M1712 Unilateral primary osteoarthritis, left knee: Secondary | ICD-10-CM | POA: Diagnosis not present

## 2023-02-07 DIAGNOSIS — M1712 Unilateral primary osteoarthritis, left knee: Secondary | ICD-10-CM | POA: Diagnosis not present

## 2023-02-08 DIAGNOSIS — M1712 Unilateral primary osteoarthritis, left knee: Secondary | ICD-10-CM | POA: Diagnosis not present

## 2023-02-09 DIAGNOSIS — M1712 Unilateral primary osteoarthritis, left knee: Secondary | ICD-10-CM | POA: Diagnosis not present

## 2023-02-10 DIAGNOSIS — M1712 Unilateral primary osteoarthritis, left knee: Secondary | ICD-10-CM | POA: Diagnosis not present

## 2023-02-11 DIAGNOSIS — M1712 Unilateral primary osteoarthritis, left knee: Secondary | ICD-10-CM | POA: Diagnosis not present

## 2023-02-12 DIAGNOSIS — M1712 Unilateral primary osteoarthritis, left knee: Secondary | ICD-10-CM | POA: Diagnosis not present

## 2023-02-13 DIAGNOSIS — M1712 Unilateral primary osteoarthritis, left knee: Secondary | ICD-10-CM | POA: Diagnosis not present

## 2023-02-14 DIAGNOSIS — M1712 Unilateral primary osteoarthritis, left knee: Secondary | ICD-10-CM | POA: Diagnosis not present

## 2023-02-15 DIAGNOSIS — M1712 Unilateral primary osteoarthritis, left knee: Secondary | ICD-10-CM | POA: Diagnosis not present

## 2023-02-16 DIAGNOSIS — M1712 Unilateral primary osteoarthritis, left knee: Secondary | ICD-10-CM | POA: Diagnosis not present

## 2023-02-17 DIAGNOSIS — M1712 Unilateral primary osteoarthritis, left knee: Secondary | ICD-10-CM | POA: Diagnosis not present

## 2023-02-18 DIAGNOSIS — M1712 Unilateral primary osteoarthritis, left knee: Secondary | ICD-10-CM | POA: Diagnosis not present

## 2023-02-19 DIAGNOSIS — M1712 Unilateral primary osteoarthritis, left knee: Secondary | ICD-10-CM | POA: Diagnosis not present

## 2023-02-20 DIAGNOSIS — M1712 Unilateral primary osteoarthritis, left knee: Secondary | ICD-10-CM | POA: Diagnosis not present

## 2023-02-21 DIAGNOSIS — M1712 Unilateral primary osteoarthritis, left knee: Secondary | ICD-10-CM | POA: Diagnosis not present

## 2023-02-22 DIAGNOSIS — M1712 Unilateral primary osteoarthritis, left knee: Secondary | ICD-10-CM | POA: Diagnosis not present

## 2023-02-23 DIAGNOSIS — M1712 Unilateral primary osteoarthritis, left knee: Secondary | ICD-10-CM | POA: Diagnosis not present

## 2023-02-24 DIAGNOSIS — M1712 Unilateral primary osteoarthritis, left knee: Secondary | ICD-10-CM | POA: Diagnosis not present

## 2023-02-25 DIAGNOSIS — M1712 Unilateral primary osteoarthritis, left knee: Secondary | ICD-10-CM | POA: Diagnosis not present

## 2023-02-26 DIAGNOSIS — M1712 Unilateral primary osteoarthritis, left knee: Secondary | ICD-10-CM | POA: Diagnosis not present

## 2023-02-27 DIAGNOSIS — M1712 Unilateral primary osteoarthritis, left knee: Secondary | ICD-10-CM | POA: Diagnosis not present

## 2023-02-28 DIAGNOSIS — M1712 Unilateral primary osteoarthritis, left knee: Secondary | ICD-10-CM | POA: Diagnosis not present

## 2023-03-01 DIAGNOSIS — M1712 Unilateral primary osteoarthritis, left knee: Secondary | ICD-10-CM | POA: Diagnosis not present

## 2023-03-02 DIAGNOSIS — M1712 Unilateral primary osteoarthritis, left knee: Secondary | ICD-10-CM | POA: Diagnosis not present

## 2023-03-03 DIAGNOSIS — M1712 Unilateral primary osteoarthritis, left knee: Secondary | ICD-10-CM | POA: Diagnosis not present

## 2023-03-04 DIAGNOSIS — M1712 Unilateral primary osteoarthritis, left knee: Secondary | ICD-10-CM | POA: Diagnosis not present

## 2023-03-05 ENCOUNTER — Ambulatory Visit (HOSPITAL_COMMUNITY)
Admission: EM | Admit: 2023-03-05 | Discharge: 2023-03-05 | Disposition: A | Discharge status: Home / Self Care | Payer: Medicaid Other | Attending: Emergency Medicine | Admitting: Emergency Medicine

## 2023-03-05 ENCOUNTER — Encounter (HOSPITAL_COMMUNITY): Payer: Self-pay

## 2023-03-05 DIAGNOSIS — H6993 Unspecified Eustachian tube disorder, bilateral: Secondary | ICD-10-CM

## 2023-03-05 DIAGNOSIS — M1712 Unilateral primary osteoarthritis, left knee: Secondary | ICD-10-CM | POA: Diagnosis not present

## 2023-03-05 DIAGNOSIS — H538 Other visual disturbances: Secondary | ICD-10-CM

## 2023-03-05 MED ORDER — CETIRIZINE HCL 10 MG PO TABS: 10.0000 mg | ORAL_TABLET | Freq: Every day | ORAL | 2 refills | Status: AC

## 2023-03-05 MED ORDER — FLUTICASONE PROPIONATE 50 MCG/ACT NA SUSP: 2.0000 | Freq: Every day | NASAL | 2 refills | Status: AC

## 2023-03-05 NOTE — ED Triage Notes (Addendum)
Patient c/o a sore throat x 3-4 days and reports that her left eye is sensitive to light and feels like she has A glare " over her left eye x 2 days.  Patient added that she also has left ear pain and began 3-4 daysa go.

## 2023-03-05 NOTE — Discharge Instructions (Addendum)
Rinse the eye with saline drops as needed Please follow up with your eye doctor this week to further evaluate your symptoms. If you have sudden pain or loss of vision, please go to the emergency department.   I recommend to use once daily Zyrtec with once daily nasal spray.  This should help with ear pressure/pain and any nasal congestion or allergy symptoms you may have. Follow up with your primary care provider.

## 2023-03-05 NOTE — ED Provider Notes (Signed)
[facility]    CSN: [id number] Arrival date & time: 03/05/23  1029      History   Chief Complaint Chief Complaint  Patient presents with   Sore Throat   Eye Problem    HPI Katie [name] is a 57 y.o. female.  Here with 3-day history of scratchy throat, 7/10 left ear pain.  Also reports a "glare" over the left eye, feels blurry. Denies any pain or vision loss. Does wear contacts. No foreign body or gritty sensation. She tried to rinse with saline solution.  She has no history of eye problems. No drainage or discharge.  No fevers   Past Medical History:  Diagnosis Date   Anxiety    Back pain    Bipolar 1 disorder (HCC)    Chronic pain entered 08/15/2015   Treated with Oxycodone   Depression    Depression    Dyspnea    History of acute bronchitis    HTN (hypertension)    OA (osteoarthritis)    Obesity    PE (pulmonary embolism)    oct 2014   Sciatica     Patient Active Problem List   Diagnosis Date Noted   Allergic reaction caused by a drug 07/26/2021   Primary osteoarthritis of left knee 12/29/2019   Morbid obesity (HCC)    Bradycardia    Pulmonary embolism on long-term anticoagulation therapy (HCC) 04/06/2016   Pain in the chest    Chronic pain    Chest wall pain    Pulmonary embolism (HCC) 07/31/2013   Vocal cord dysfunction 02/28/2012   Dyspnea 01/17/2012   Chest pain 11/25/2011   Hypokalemia 11/25/2011   GERD (gastroesophageal reflux disease) 03/08/2011   DYSMENORRHEA 11/10/2010   PLANTAR FASCIITIS 09/14/2010   OSTEOARTHRITIS, KNEES, BILATERAL, 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 01/03/2007   Irritable bowel syndrome 01/03/2007    Past Surgical  History:  Procedure Laterality Date   ADENOIDECTOMY     BIOPSY STOMACH     CARDIAC CATHETERIZATION  2009   normal; in [place]   CESAREAN SECTION  2002   x 2, 1997 and 2002   CHOLECYSTECTOMY  2007   KNEE SURGERY Right 2005   TONSILLECTOMY     TOTAL KNEE ARTHROPLASTY Left 01/27/2020   Procedure: TOTAL KNEE ARTHROPLASTY;  Surgeon: [name], MD;  Location: [facility];  Service: Orthopedics;  Laterality: Left;   TYMPANOSTOMY TUBE PLACEMENT      OB History   No obstetric history on file.      Home Medications    Prior to Admission medications   Medication Sig Start Date End Date Taking? Authorizing Provider  cetirizine (ZYRTEC ALLERGY) 10 MG tablet Take 1 tablet (10 mg total) by mouth daily. 03/05/23  Yes Aldina Porta, Lurena Joiner, PA-C  fluticasone (FLONASE) 50 MCG/ACT nasal spray Place 2 sprays into both nostrils daily. 03/05/23  Yes Makayli Bracken, Lurena Joiner, PA-C  aspirin 81 MG chewable tablet Chew 81 mg by mouth daily.    [provider]  diazepam (VALIUM) 10 MG tablet Take 10 mg by mouth 2 (two) times daily. 06/25/21   [provider]  furosemide (LASIX) 40 MG tablet Take 1 tablet (40 mg total) by mouth daily.  08/02/13   Zannie Cove, MD  meclizine (ANTIVERT) 25 MG tablet Take 25 mg by mouth every 6 (six) hours as needed for dizziness. 09/23/21   [provider]  methocarbamol (ROBAXIN) 500 MG tablet Take 2 tablets (1,000 mg total) by mouth every 8 (eight) hours as needed for muscle spasms. 08/28/22   Nira Conn, MD  omeprazole (PRILOSEC) 40 MG capsule Take 40 mg by mouth daily.    [provider]  phentermine (ADIPEX-P) 37.5 MG tablet Take 37.5 mg by mouth every morning.     [provider]  potassium chloride SA (K-DUR,KLOR-CON) 20 MEQ tablet Take 20 mEq by mouth 2 (two) times daily. 05/03/16   [provider]    Family History Family History  Problem Relation Age of Onset   Heart disease Mother    Diabetes Father     Asthma Sister        also had rheumatic fever as a child, died age 59    Social History Social History   Tobacco Use   Smoking status: Never   Smokeless tobacco: Never   Tobacco comments:    second hand smoke  Vaping Use   Vaping Use: Never used  Substance Use Topics   Alcohol use: Yes    Alcohol/week: 0.0 standard drinks of alcohol    Comment: 2-3 x week has a glass of wine   Drug use: Yes    Frequency: 1.0 times per week    Types: Marijuana     Allergies   Contrast media [iodinated contrast media], Percocet [oxycodone-acetaminophen], Toradol [ketorolac tromethamine], Tylenol [acetaminophen], and Vicodin [hydrocodone-acetaminophen]   Review of Systems Review of Systems As per HPI  Physical Exam Triage Vital Signs ED Triage Vitals  Enc Vitals Group     BP 03/05/23 1226 (!) 133/90     Pulse Rate 03/05/23 1226 62     Resp 03/05/23 1226 18     Temp 03/05/23 1226 98.5 F (36.9 C)     Temp Source 03/05/23 1226 Oral     SpO2 03/05/23 1226 96 %     Weight --      Height --      Head Circumference --      Peak Flow --      Pain Score 03/05/23 1227 4     Pain Loc --      Pain Edu? --      Excl. in GC? --    No data found.  Updated Vital Signs BP (!) 133/90 (BP Location: Left Arm)   Pulse 62   Temp 98.5 F (36.9 C) (Oral)   Resp 18   LMP 12/28/2015 (Exact Date)   SpO2 96%   Physical Exam Vitals and nursing note reviewed.  Constitutional:      General: She is not in acute distress.    Appearance: She is not ill-appearing.  HENT:     Right Ear: Tympanic membrane and ear canal normal.     Left Ear: Tympanic membrane and ear canal normal.     Ears:     Comments: Normal TM and canals bilaterally     Nose: No congestion or rhinorrhea.     Mouth/Throat:     Mouth: Mucous membranes are moist.     Pharynx: Oropharynx is clear. No posterior oropharyngeal erythema.  Eyes:     General: Lids are normal. Lids are everted, no foreign bodies appreciated. Vision  grossly intact. Gaze aligned appropriately. No scleral icterus.  Right eye: No discharge.        Left eye: No foreign body or discharge.     Extraocular Movements: Extraocular movements intact.     Pupils: Pupils are equal, round, and reactive to light.     Left eye: No corneal abrasion or fluorescein uptake.     Comments: Small cloudy smudge over the left conjunctiva, partly over the pupil. The pupil itself its not cloudy, no sign of cataract. No fluorescein uptake on exam. No foreign body.   Cardiovascular:     Rate and Rhythm: Normal rate and regular rhythm.  Pulmonary:     Effort: Pulmonary effort is normal.     Breath sounds: Normal breath sounds.  Musculoskeletal:     Cervical back: Normal range of motion.  Lymphadenopathy:     Cervical: No cervical adenopathy.  Neurological:     Mental Status: She is alert and oriented to person, place, and time.      UC Treatments / Results  Labs (all labs ordered are listed, but only abnormal results are displayed) Labs Reviewed - No data to display  EKG  Radiology No results found.  Procedures Procedures (including critical care time)  Medications Ordered in UC Medications - No data to display  Initial Impression / Assessment and Plan / UC Course  I have reviewed the triage vital signs and the nursing notes.  Pertinent labs & imaging results that were available during my care of the patient were reviewed by me and considered in my medical decision making (see chart for details).  Ear pain No sign of infection. Recommend once daily zyrtec and nasal spray to reduce any pressure in ET tubes. Can return or follow with primary if symptoms persist.  Blurry vision of left eye No uptake on fluorescein exam, no ulcer or abrasion. Reassuring she has no pain. There is an area that looks like a cloudy smudge on the conjunctiva. Recommend continue saline rinse and close follow with her eye specialist. Strict ED precautions were  discussed, patient verbalizes understanding   Final Clinical Impressions(s) / UC Diagnoses   Final diagnoses:  Blurry vision, left eye  Eustachian tube dysfunction, bilateral     Discharge Instructions      Rinse the eye with saline drops as needed Please follow up with your eye doctor this week to further evaluate your symptoms. If you have sudden pain or loss of vision, please go to the emergency department.   I recommend to use once daily Zyrtec with once daily nasal spray.  This should help with ear pressure/pain and any nasal congestion or allergy symptoms you may have. Follow up with your primary care provider.     ED Prescriptions     Medication Sig Dispense Auth. Provider   fluticasone (FLONASE) 50 MCG/ACT nasal spray Place 2 sprays into both nostrils daily. 9.9 mL Caira Poche, PA-C   cetirizine (ZYRTEC ALLERGY) 10 MG tablet Take 1 tablet (10 mg total) by mouth daily. 30 tablet Collin Hendley, Lurena Joiner, PA-C      PDMP not reviewed this encounter.   Curley Fayette, Ray Church 03/05/23 2022

## 2023-03-06 DIAGNOSIS — M1712 Unilateral primary osteoarthritis, left knee: Secondary | ICD-10-CM | POA: Diagnosis not present

## 2023-03-07 DIAGNOSIS — M1712 Unilateral primary osteoarthritis, left knee: Secondary | ICD-10-CM | POA: Diagnosis not present

## 2023-03-08 DIAGNOSIS — M1712 Unilateral primary osteoarthritis, left knee: Secondary | ICD-10-CM | POA: Diagnosis not present

## 2023-03-09 DIAGNOSIS — M1712 Unilateral primary osteoarthritis, left knee: Secondary | ICD-10-CM | POA: Diagnosis not present

## 2023-03-10 DIAGNOSIS — M1712 Unilateral primary osteoarthritis, left knee: Secondary | ICD-10-CM | POA: Diagnosis not present

## 2023-03-11 DIAGNOSIS — M1712 Unilateral primary osteoarthritis, left knee: Secondary | ICD-10-CM | POA: Diagnosis not present

## 2023-03-12 DIAGNOSIS — M1712 Unilateral primary osteoarthritis, left knee: Secondary | ICD-10-CM | POA: Diagnosis not present

## 2023-03-13 DIAGNOSIS — M1712 Unilateral primary osteoarthritis, left knee: Secondary | ICD-10-CM | POA: Diagnosis not present

## 2023-03-14 DIAGNOSIS — M1712 Unilateral primary osteoarthritis, left knee: Secondary | ICD-10-CM | POA: Diagnosis not present

## 2023-03-15 DIAGNOSIS — M1712 Unilateral primary osteoarthritis, left knee: Secondary | ICD-10-CM | POA: Diagnosis not present

## 2023-03-16 DIAGNOSIS — M1712 Unilateral primary osteoarthritis, left knee: Secondary | ICD-10-CM | POA: Diagnosis not present

## 2023-03-17 DIAGNOSIS — M1712 Unilateral primary osteoarthritis, left knee: Secondary | ICD-10-CM | POA: Diagnosis not present

## 2023-03-18 DIAGNOSIS — M1712 Unilateral primary osteoarthritis, left knee: Secondary | ICD-10-CM | POA: Diagnosis not present

## 2023-03-25 DIAGNOSIS — M1712 Unilateral primary osteoarthritis, left knee: Secondary | ICD-10-CM | POA: Diagnosis not present

## 2023-03-26 DIAGNOSIS — M1712 Unilateral primary osteoarthritis, left knee: Secondary | ICD-10-CM | POA: Diagnosis not present

## 2023-03-27 DIAGNOSIS — M1712 Unilateral primary osteoarthritis, left knee: Secondary | ICD-10-CM | POA: Diagnosis not present

## 2023-03-28 DIAGNOSIS — M1712 Unilateral primary osteoarthritis, left knee: Secondary | ICD-10-CM | POA: Diagnosis not present

## 2023-03-29 DIAGNOSIS — M1712 Unilateral primary osteoarthritis, left knee: Secondary | ICD-10-CM | POA: Diagnosis not present

## 2023-03-30 DIAGNOSIS — M1712 Unilateral primary osteoarthritis, left knee: Secondary | ICD-10-CM | POA: Diagnosis not present

## 2023-03-31 DIAGNOSIS — M1712 Unilateral primary osteoarthritis, left knee: Secondary | ICD-10-CM | POA: Diagnosis not present

## 2023-04-01 DIAGNOSIS — M1712 Unilateral primary osteoarthritis, left knee: Secondary | ICD-10-CM | POA: Diagnosis not present

## 2023-04-02 DIAGNOSIS — M1712 Unilateral primary osteoarthritis, left knee: Secondary | ICD-10-CM | POA: Diagnosis not present

## 2023-04-03 DIAGNOSIS — M1712 Unilateral primary osteoarthritis, left knee: Secondary | ICD-10-CM | POA: Diagnosis not present

## 2023-04-04 DIAGNOSIS — M1712 Unilateral primary osteoarthritis, left knee: Secondary | ICD-10-CM | POA: Diagnosis not present

## 2023-04-05 DIAGNOSIS — M1712 Unilateral primary osteoarthritis, left knee: Secondary | ICD-10-CM | POA: Diagnosis not present

## 2023-04-06 DIAGNOSIS — M1712 Unilateral primary osteoarthritis, left knee: Secondary | ICD-10-CM | POA: Diagnosis not present

## 2023-04-07 DIAGNOSIS — M1712 Unilateral primary osteoarthritis, left knee: Secondary | ICD-10-CM | POA: Diagnosis not present

## 2023-04-08 DIAGNOSIS — M1712 Unilateral primary osteoarthritis, left knee: Secondary | ICD-10-CM | POA: Diagnosis not present

## 2023-04-09 DIAGNOSIS — M1712 Unilateral primary osteoarthritis, left knee: Secondary | ICD-10-CM | POA: Diagnosis not present

## 2023-04-10 DIAGNOSIS — M1712 Unilateral primary osteoarthritis, left knee: Secondary | ICD-10-CM | POA: Diagnosis not present

## 2023-04-11 DIAGNOSIS — M1712 Unilateral primary osteoarthritis, left knee: Secondary | ICD-10-CM | POA: Diagnosis not present

## 2023-04-12 DIAGNOSIS — M1712 Unilateral primary osteoarthritis, left knee: Secondary | ICD-10-CM | POA: Diagnosis not present

## 2023-04-13 DIAGNOSIS — M1712 Unilateral primary osteoarthritis, left knee: Secondary | ICD-10-CM | POA: Diagnosis not present

## 2023-04-14 DIAGNOSIS — M1712 Unilateral primary osteoarthritis, left knee: Secondary | ICD-10-CM | POA: Diagnosis not present

## 2023-04-15 DIAGNOSIS — M1712 Unilateral primary osteoarthritis, left knee: Secondary | ICD-10-CM | POA: Diagnosis not present

## 2023-04-16 DIAGNOSIS — M1712 Unilateral primary osteoarthritis, left knee: Secondary | ICD-10-CM | POA: Diagnosis not present

## 2023-04-17 DIAGNOSIS — M1712 Unilateral primary osteoarthritis, left knee: Secondary | ICD-10-CM | POA: Diagnosis not present

## 2023-04-18 DIAGNOSIS — M1712 Unilateral primary osteoarthritis, left knee: Secondary | ICD-10-CM | POA: Diagnosis not present

## 2023-04-19 DIAGNOSIS — M1712 Unilateral primary osteoarthritis, left knee: Secondary | ICD-10-CM | POA: Diagnosis not present

## 2023-04-20 DIAGNOSIS — M1712 Unilateral primary osteoarthritis, left knee: Secondary | ICD-10-CM | POA: Diagnosis not present

## 2023-04-21 DIAGNOSIS — M1712 Unilateral primary osteoarthritis, left knee: Secondary | ICD-10-CM | POA: Diagnosis not present

## 2023-04-22 DIAGNOSIS — M1712 Unilateral primary osteoarthritis, left knee: Secondary | ICD-10-CM | POA: Diagnosis not present

## 2023-04-23 DIAGNOSIS — M1712 Unilateral primary osteoarthritis, left knee: Secondary | ICD-10-CM | POA: Diagnosis not present

## 2023-04-24 DIAGNOSIS — M1712 Unilateral primary osteoarthritis, left knee: Secondary | ICD-10-CM | POA: Diagnosis not present

## 2023-04-25 DIAGNOSIS — M1712 Unilateral primary osteoarthritis, left knee: Secondary | ICD-10-CM | POA: Diagnosis not present

## 2023-04-26 DIAGNOSIS — M1712 Unilateral primary osteoarthritis, left knee: Secondary | ICD-10-CM | POA: Diagnosis not present

## 2023-04-27 DIAGNOSIS — M1712 Unilateral primary osteoarthritis, left knee: Secondary | ICD-10-CM | POA: Diagnosis not present

## 2023-04-28 DIAGNOSIS — M1712 Unilateral primary osteoarthritis, left knee: Secondary | ICD-10-CM | POA: Diagnosis not present

## 2023-04-29 DIAGNOSIS — M1712 Unilateral primary osteoarthritis, left knee: Secondary | ICD-10-CM | POA: Diagnosis not present

## 2023-04-30 DIAGNOSIS — M1712 Unilateral primary osteoarthritis, left knee: Secondary | ICD-10-CM | POA: Diagnosis not present

## 2023-05-01 DIAGNOSIS — M1712 Unilateral primary osteoarthritis, left knee: Secondary | ICD-10-CM | POA: Diagnosis not present

## 2023-05-02 DIAGNOSIS — M1712 Unilateral primary osteoarthritis, left knee: Secondary | ICD-10-CM | POA: Diagnosis not present

## 2023-05-03 DIAGNOSIS — M1712 Unilateral primary osteoarthritis, left knee: Secondary | ICD-10-CM | POA: Diagnosis not present

## 2023-05-04 DIAGNOSIS — M1712 Unilateral primary osteoarthritis, left knee: Secondary | ICD-10-CM | POA: Diagnosis not present

## 2023-05-05 ENCOUNTER — Emergency Department (HOSPITAL_COMMUNITY)
Admission: EM | Admit: 2023-05-05 | Discharge: 2023-05-05 | Disposition: A | Discharge status: Home / Self Care | Payer: Commercial Managed Care - HMO | Attending: Emergency Medicine | Admitting: Emergency Medicine

## 2023-05-05 ENCOUNTER — Other Ambulatory Visit: Payer: Self-pay

## 2023-05-05 DIAGNOSIS — M5442 Lumbago with sciatica, left side: Secondary | ICD-10-CM | POA: Diagnosis not present

## 2023-05-05 DIAGNOSIS — Z7982 Long term (current) use of aspirin: Secondary | ICD-10-CM | POA: Insufficient documentation

## 2023-05-05 DIAGNOSIS — M1712 Unilateral primary osteoarthritis, left knee: Secondary | ICD-10-CM | POA: Diagnosis not present

## 2023-05-05 DIAGNOSIS — M549 Dorsalgia, unspecified: Secondary | ICD-10-CM | POA: Diagnosis present

## 2023-05-05 DIAGNOSIS — R059 Cough, unspecified: Secondary | ICD-10-CM | POA: Insufficient documentation

## 2023-05-05 DIAGNOSIS — Z7901 Long term (current) use of anticoagulants: Secondary | ICD-10-CM | POA: Diagnosis not present

## 2023-05-05 DIAGNOSIS — G8929 Other chronic pain: Secondary | ICD-10-CM | POA: Diagnosis not present

## 2023-05-05 DIAGNOSIS — Z86711 Personal history of pulmonary embolism: Secondary | ICD-10-CM | POA: Insufficient documentation

## 2023-05-05 DIAGNOSIS — M5441 Lumbago with sciatica, right side: Secondary | ICD-10-CM | POA: Insufficient documentation

## 2023-05-05 MED ORDER — HYDROMORPHONE HCL 1 MG/ML IJ SOLN
1.0000 mg | Freq: Once | INTRAMUSCULAR | Status: AC
  Administered 2023-05-05: 1 mg via INTRAMUSCULAR
  Filled 2023-05-05: qty 1

## 2023-05-05 MED ORDER — OXYCODONE HCL 5 MG PO TABS: 5.0000 mg | ORAL_TABLET | Freq: Four times a day (QID) | ORAL | 0 refills | Status: DC | PRN

## 2023-05-05 MED ORDER — BENZONATATE 100 MG PO CAPS
100.0000 mg | ORAL_CAPSULE | Freq: Once | ORAL | Status: AC
  Administered 2023-05-05: 100 mg via ORAL
  Filled 2023-05-05: qty 1

## 2023-05-05 MED ORDER — METHOCARBAMOL 500 MG PO TABS: 500.0000 mg | ORAL_TABLET | Freq: Three times a day (TID) | ORAL | 0 refills | Status: DC | PRN

## 2023-05-05 MED ORDER — METHOCARBAMOL 500 MG PO TABS
500.0000 mg | ORAL_TABLET | Freq: Once | ORAL | Status: AC
  Administered 2023-05-05: 500 mg via ORAL
  Filled 2023-05-05: qty 1

## 2023-05-05 NOTE — ED Provider Notes (Signed)
[place] EMERGENCY DEPARTMENT AT [facility] Provider Note   CSN: [id number] Arrival date & time: 05/05/23  0349     History  Chief Complaint  Patient presents with   Back Pain    Katie [name] is a 57 y.o. female.  The history is provided by the patient.  Back Pain Katie [name] is a 57 y.o. female who presents to the Emergency Department complaining of back pain.  She presents to the emergency department for evaluation of flareup of her chronic back pain.  She states that she has a history of sciatica that causes pain throughout her entire spine that radiates down the bilateral legs.  4 days ago she had a flareup of her sciatica.  This afternoon she was cleaning her son's room and this pain significantly worsened to the point that is difficult to move.  No associated chest pain, abdominal pain, dysuria, fevers, nausea, vomiting.  She did get a cough related to the cleaning solution she was using to clean her son's room.  No numbness, weakness.  No loss of bowel or bladder.  She has a history of back pain, PE on Xarelto.  No reported falls or injuries.     Home Medications Prior to Admission medications   Medication Sig Start Date End Date Taking? Authorizing Provider  methocarbamol (ROBAXIN) 500 MG tablet Take 1 tablet (500 mg total) by mouth every 8 (eight) hours as needed for muscle spasms. 05/05/23  Yes Tilden Fossa, MD  aspirin 81 MG chewable tablet Chew 81 mg by mouth daily.    [provider]  cetirizine (ZYRTEC ALLERGY) 10 MG tablet Take 1 tablet (10 mg total) by mouth daily. 03/05/23   Rising, Lurena Joiner, PA-C  diazepam (VALIUM) 10 MG tablet Take 10 mg by mouth 2 (two) times daily. 06/25/21   [provider]  fluticasone (FLONASE) 50 MCG/ACT nasal spray Place 2 sprays into both nostrils daily. 03/05/23   Rising, Lurena Joiner, PA-C  furosemide (LASIX) 40 MG tablet Take 1 tablet (40 mg total) by mouth daily. 08/02/13   Zannie Cove, MD  meclizine  (ANTIVERT) 25 MG tablet Take 25 mg by mouth every 6 (six) hours as needed for dizziness. 09/23/21   [provider]  omeprazole (PRILOSEC) 40 MG capsule Take 40 mg by mouth daily.    [provider]  phentermine (ADIPEX-P) 37.5 MG tablet Take 37.5 mg by mouth every morning.     [provider]  potassium chloride SA (K-DUR,KLOR-CON) 20 MEQ tablet Take 20 mEq by mouth 2 (two) times daily. 05/03/16   [provider]      Allergies    Contrast media [iodinated contrast media], Percocet [oxycodone-acetaminophen], Toradol [ketorolac tromethamine], Tylenol [acetaminophen], and Vicodin [hydrocodone-acetaminophen]    Review of Systems   Review of Systems  Musculoskeletal:  Positive for back pain.  All other systems reviewed and are negative.   Physical Exam Updated Vital Signs BP 134/86 (BP Location: Right Arm)   Pulse 61   Temp 97.6 F (36.4 C) (Oral)   Resp 15   Ht 5\' 5"  (1.651 m)   Wt 111.1 kg   LMP 12/28/2015 (Exact Date)   SpO2 96%   BMI 40.77 kg/m  Physical Exam Vitals and nursing note reviewed.  Constitutional:      Appearance: She is well-developed.  HENT:     Head: Normocephalic and atraumatic.  Cardiovascular:     Rate and Rhythm: Normal rate and regular rhythm.  Pulmonary:  Effort: Pulmonary effort is normal. No respiratory distress.  Abdominal:     Palpations: Abdomen is soft.     Tenderness: There is no abdominal tenderness. There is no guarding or rebound.  Musculoskeletal:        General: No tenderness.     Comments: 2+ DP pulses bilaterally.    She does have pain on elevating bilateral lower extremities, right greater than left.  There is tenderness to palpation throughout the entire thoracic and lumbar spine.  No CVA tenderness.  Skin:    General: Skin is warm and dry.  Neurological:     Mental Status: She is alert and oriented to person, place, and time.     Comments: 5 out of 5 strength in bilateral lower extremities  with sensation to light touch intact in bilateral lower extremities.  Psychiatric:        Behavior: Behavior normal.     ED Results / Procedures / Treatments   Labs (all labs ordered are listed, but only abnormal results are displayed) Labs Reviewed - No data to display  EKG None  Radiology No results found.  Procedures Procedures    Medications Ordered in ED Medications  HYDROmorphone (DILAUDID) injection 1 mg (1 mg Intramuscular Given 05/05/23 0548)  methocarbamol (ROBAXIN) tablet 500 mg (500 mg Oral Given 05/05/23 0547)  benzonatate (TESSALON) capsule 100 mg (100 mg Oral Given 05/05/23 0547)    ED Course/ Medical Decision Making/ A&P                             Medical Decision Making Risk Prescription drug management.   Patient with history of PE on anticoagulation, chronic back pain here for evaluation of exacerbation of her chronic back pain.  She is neurologically intact on evaluation.  Current clinical picture is not consistent with acute cord compression.  Pain was treated with medications in the emergency department with improvement in symptoms.  Plan to discharge home with supportive measures at home for back pain.  Discussed outpatient follow-up and return precautions.        Final Clinical Impression(s) / ED Diagnoses Final diagnoses:  Chronic midline low back pain with bilateral sciatica    Rx / DC Orders ED Discharge Orders          Ordered    methocarbamol (ROBAXIN) 500 MG tablet  Every 8 hours PRN        05/05/23 1610              Tilden Fossa, MD 05/05/23 0700

## 2023-05-05 NOTE — ED Triage Notes (Signed)
C/o sciatic pain  onset 4 days ago states she has a history of same,  states she was cleaning her sons carpet and thinks she inhaled some of the cleaner so when she coughs it increases her back pain. No resp issues.

## 2023-05-06 DIAGNOSIS — M1712 Unilateral primary osteoarthritis, left knee: Secondary | ICD-10-CM | POA: Diagnosis not present

## 2023-05-26 ENCOUNTER — Emergency Department (HOSPITAL_COMMUNITY): Payer: Commercial Managed Care - HMO

## 2023-05-26 ENCOUNTER — Encounter (HOSPITAL_COMMUNITY): Payer: Self-pay

## 2023-05-26 ENCOUNTER — Inpatient Hospital Stay (HOSPITAL_COMMUNITY)
Admission: EM | Admit: 2023-05-26 | Discharge: 2023-06-02 | DRG: 493 | Disposition: A | Discharge status: Rehabilitation Facility | Payer: Commercial Managed Care - HMO | Attending: Surgery | Admitting: Surgery

## 2023-05-26 ENCOUNTER — Other Ambulatory Visit: Payer: Self-pay

## 2023-05-26 DIAGNOSIS — S82302D Unspecified fracture of lower end of left tibia, subsequent encounter for closed fracture with routine healing: Secondary | ICD-10-CM | POA: Diagnosis not present

## 2023-05-26 DIAGNOSIS — Z96652 Presence of left artificial knee joint: Secondary | ICD-10-CM | POA: Diagnosis present

## 2023-05-26 DIAGNOSIS — Y92410 Unspecified street and highway as the place of occurrence of the external cause: Secondary | ICD-10-CM

## 2023-05-26 DIAGNOSIS — F419 Anxiety disorder, unspecified: Secondary | ICD-10-CM | POA: Diagnosis present

## 2023-05-26 DIAGNOSIS — Z885 Allergy status to narcotic agent status: Secondary | ICD-10-CM | POA: Diagnosis not present

## 2023-05-26 DIAGNOSIS — L97929 Non-pressure chronic ulcer of unspecified part of left lower leg with unspecified severity: Secondary | ICD-10-CM | POA: Diagnosis present

## 2023-05-26 DIAGNOSIS — S82252A Displaced comminuted fracture of shaft of left tibia, initial encounter for closed fracture: Secondary | ICD-10-CM | POA: Diagnosis not present

## 2023-05-26 DIAGNOSIS — S82851A Displaced trimalleolar fracture of right lower leg, initial encounter for closed fracture: Principal | ICD-10-CM

## 2023-05-26 DIAGNOSIS — M47816 Spondylosis without myelopathy or radiculopathy, lumbar region: Secondary | ICD-10-CM | POA: Diagnosis not present

## 2023-05-26 DIAGNOSIS — I959 Hypotension, unspecified: Secondary | ICD-10-CM | POA: Diagnosis not present

## 2023-05-26 DIAGNOSIS — S82872A Displaced pilon fracture of left tibia, initial encounter for closed fracture: Secondary | ICD-10-CM | POA: Diagnosis not present

## 2023-05-26 DIAGNOSIS — R079 Chest pain, unspecified: Secondary | ICD-10-CM | POA: Diagnosis not present

## 2023-05-26 DIAGNOSIS — E871 Hypo-osmolality and hyponatremia: Secondary | ICD-10-CM | POA: Diagnosis not present

## 2023-05-26 DIAGNOSIS — I96 Gangrene, not elsewhere classified: Secondary | ICD-10-CM | POA: Diagnosis present

## 2023-05-26 DIAGNOSIS — F319 Bipolar disorder, unspecified: Secondary | ICD-10-CM | POA: Diagnosis present

## 2023-05-26 DIAGNOSIS — Z8249 Family history of ischemic heart disease and other diseases of the circulatory system: Secondary | ICD-10-CM

## 2023-05-26 DIAGNOSIS — S92015A Nondisplaced fracture of body of left calcaneus, initial encounter for closed fracture: Secondary | ICD-10-CM | POA: Diagnosis not present

## 2023-05-26 DIAGNOSIS — K5903 Drug induced constipation: Secondary | ICD-10-CM | POA: Diagnosis not present

## 2023-05-26 DIAGNOSIS — Z4789 Encounter for other orthopedic aftercare: Secondary | ICD-10-CM | POA: Diagnosis not present

## 2023-05-26 DIAGNOSIS — S8251XA Displaced fracture of medial malleolus of right tibia, initial encounter for closed fracture: Secondary | ICD-10-CM | POA: Diagnosis not present

## 2023-05-26 DIAGNOSIS — S82899S Other fracture of unspecified lower leg, sequela: Secondary | ICD-10-CM | POA: Diagnosis not present

## 2023-05-26 DIAGNOSIS — S22080D Wedge compression fracture of T11-T12 vertebra, subsequent encounter for fracture with routine healing: Secondary | ICD-10-CM | POA: Diagnosis not present

## 2023-05-26 DIAGNOSIS — Z041 Encounter for examination and observation following transport accident: Secondary | ICD-10-CM | POA: Diagnosis not present

## 2023-05-26 DIAGNOSIS — E876 Hypokalemia: Secondary | ICD-10-CM | POA: Diagnosis present

## 2023-05-26 DIAGNOSIS — Z471 Aftercare following joint replacement surgery: Secondary | ICD-10-CM | POA: Diagnosis not present

## 2023-05-26 DIAGNOSIS — S82302A Unspecified fracture of lower end of left tibia, initial encounter for closed fracture: Secondary | ICD-10-CM | POA: Diagnosis not present

## 2023-05-26 DIAGNOSIS — G8918 Other acute postprocedural pain: Secondary | ICD-10-CM | POA: Diagnosis not present

## 2023-05-26 DIAGNOSIS — K5900 Constipation, unspecified: Secondary | ICD-10-CM | POA: Diagnosis not present

## 2023-05-26 DIAGNOSIS — Z7982 Long term (current) use of aspirin: Secondary | ICD-10-CM | POA: Diagnosis not present

## 2023-05-26 DIAGNOSIS — M4854XD Collapsed vertebra, not elsewhere classified, thoracic region, subsequent encounter for fracture with routine healing: Secondary | ICD-10-CM | POA: Diagnosis present

## 2023-05-26 DIAGNOSIS — S82832A Other fracture of upper and lower end of left fibula, initial encounter for closed fracture: Secondary | ICD-10-CM | POA: Diagnosis present

## 2023-05-26 DIAGNOSIS — S82841A Displaced bimalleolar fracture of right lower leg, initial encounter for closed fracture: Secondary | ICD-10-CM | POA: Diagnosis not present

## 2023-05-26 DIAGNOSIS — M4854XA Collapsed vertebra, not elsewhere classified, thoracic region, initial encounter for fracture: Secondary | ICD-10-CM | POA: Diagnosis not present

## 2023-05-26 DIAGNOSIS — S82401D Unspecified fracture of shaft of right fibula, subsequent encounter for closed fracture with routine healing: Secondary | ICD-10-CM | POA: Diagnosis not present

## 2023-05-26 DIAGNOSIS — Z9049 Acquired absence of other specified parts of digestive tract: Secondary | ICD-10-CM | POA: Diagnosis not present

## 2023-05-26 DIAGNOSIS — Z91041 Radiographic dye allergy status: Secondary | ICD-10-CM

## 2023-05-26 DIAGNOSIS — Z6841 Body Mass Index (BMI) 40.0 and over, adult: Secondary | ICD-10-CM

## 2023-05-26 DIAGNOSIS — Z86718 Personal history of other venous thrombosis and embolism: Secondary | ICD-10-CM | POA: Diagnosis not present

## 2023-05-26 DIAGNOSIS — E8889 Other specified metabolic disorders: Secondary | ICD-10-CM | POA: Diagnosis present

## 2023-05-26 DIAGNOSIS — Z23 Encounter for immunization: Secondary | ICD-10-CM

## 2023-05-26 DIAGNOSIS — S9304XA Dislocation of right ankle joint, initial encounter: Secondary | ICD-10-CM | POA: Diagnosis present

## 2023-05-26 DIAGNOSIS — K219 Gastro-esophageal reflux disease without esophagitis: Secondary | ICD-10-CM | POA: Diagnosis present

## 2023-05-26 DIAGNOSIS — S82872D Displaced pilon fracture of left tibia, subsequent encounter for closed fracture with routine healing: Secondary | ICD-10-CM | POA: Diagnosis present

## 2023-05-26 DIAGNOSIS — I10 Essential (primary) hypertension: Secondary | ICD-10-CM | POA: Diagnosis not present

## 2023-05-26 DIAGNOSIS — S82891E Other fracture of right lower leg, subsequent encounter for open fracture type I or II with routine healing: Secondary | ICD-10-CM | POA: Diagnosis not present

## 2023-05-26 DIAGNOSIS — M7731 Calcaneal spur, right foot: Secondary | ICD-10-CM | POA: Diagnosis not present

## 2023-05-26 DIAGNOSIS — S92142A Displaced dome fracture of left talus, initial encounter for closed fracture: Secondary | ICD-10-CM | POA: Diagnosis not present

## 2023-05-26 DIAGNOSIS — S82402A Unspecified fracture of shaft of left fibula, initial encounter for closed fracture: Secondary | ICD-10-CM | POA: Diagnosis not present

## 2023-05-26 DIAGNOSIS — Z86711 Personal history of pulmonary embolism: Secondary | ICD-10-CM

## 2023-05-26 DIAGNOSIS — L97429 Non-pressure chronic ulcer of left heel and midfoot with unspecified severity: Secondary | ICD-10-CM | POA: Diagnosis present

## 2023-05-26 DIAGNOSIS — R609 Edema, unspecified: Secondary | ICD-10-CM | POA: Diagnosis not present

## 2023-05-26 DIAGNOSIS — Z79899 Other long term (current) drug therapy: Secondary | ICD-10-CM | POA: Diagnosis not present

## 2023-05-26 DIAGNOSIS — Z888 Allergy status to other drugs, medicaments and biological substances status: Secondary | ICD-10-CM | POA: Diagnosis not present

## 2023-05-26 DIAGNOSIS — S22080A Wedge compression fracture of T11-T12 vertebra, initial encounter for closed fracture: Secondary | ICD-10-CM

## 2023-05-26 DIAGNOSIS — S82851D Displaced trimalleolar fracture of right lower leg, subsequent encounter for closed fracture with routine healing: Secondary | ICD-10-CM | POA: Diagnosis not present

## 2023-05-26 DIAGNOSIS — S82841D Displaced bimalleolar fracture of right lower leg, subsequent encounter for closed fracture with routine healing: Secondary | ICD-10-CM | POA: Diagnosis not present

## 2023-05-26 DIAGNOSIS — S82892A Other fracture of left lower leg, initial encounter for closed fracture: Secondary | ICD-10-CM | POA: Diagnosis not present

## 2023-05-26 DIAGNOSIS — S82401A Unspecified fracture of shaft of right fibula, initial encounter for closed fracture: Secondary | ICD-10-CM | POA: Diagnosis not present

## 2023-05-26 DIAGNOSIS — S82891A Other fracture of right lower leg, initial encounter for closed fracture: Secondary | ICD-10-CM | POA: Diagnosis not present

## 2023-05-26 DIAGNOSIS — S8251XD Displaced fracture of medial malleolus of right tibia, subsequent encounter for closed fracture with routine healing: Secondary | ICD-10-CM | POA: Diagnosis not present

## 2023-05-26 DIAGNOSIS — T07XXXA Unspecified multiple injuries, initial encounter: Secondary | ICD-10-CM | POA: Diagnosis not present

## 2023-05-26 DIAGNOSIS — S82421A Displaced transverse fracture of shaft of right fibula, initial encounter for closed fracture: Secondary | ICD-10-CM | POA: Diagnosis not present

## 2023-05-26 DIAGNOSIS — R918 Other nonspecific abnormal finding of lung field: Secondary | ICD-10-CM | POA: Diagnosis not present

## 2023-05-26 DIAGNOSIS — S82892D Other fracture of left lower leg, subsequent encounter for closed fracture with routine healing: Secondary | ICD-10-CM | POA: Diagnosis not present

## 2023-05-26 DIAGNOSIS — R0789 Other chest pain: Secondary | ICD-10-CM | POA: Diagnosis not present

## 2023-05-26 DIAGNOSIS — G8929 Other chronic pain: Secondary | ICD-10-CM | POA: Diagnosis present

## 2023-05-26 DIAGNOSIS — F54 Psychological and behavioral factors associated with disorders or diseases classified elsewhere: Secondary | ICD-10-CM | POA: Diagnosis not present

## 2023-05-26 DIAGNOSIS — S82899D Other fracture of unspecified lower leg, subsequent encounter for closed fracture with routine healing: Secondary | ICD-10-CM | POA: Diagnosis not present

## 2023-05-26 DIAGNOSIS — S82452D Displaced comminuted fracture of shaft of left fibula, subsequent encounter for closed fracture with routine healing: Secondary | ICD-10-CM | POA: Diagnosis not present

## 2023-05-26 DIAGNOSIS — Z886 Allergy status to analgesic agent status: Secondary | ICD-10-CM

## 2023-05-26 DIAGNOSIS — R52 Pain, unspecified: Secondary | ICD-10-CM | POA: Diagnosis not present

## 2023-05-26 DIAGNOSIS — S3991XA Unspecified injury of abdomen, initial encounter: Secondary | ICD-10-CM | POA: Diagnosis not present

## 2023-05-26 DIAGNOSIS — S299XXA Unspecified injury of thorax, initial encounter: Secondary | ICD-10-CM | POA: Diagnosis not present

## 2023-05-26 DIAGNOSIS — D62 Acute posthemorrhagic anemia: Secondary | ICD-10-CM | POA: Diagnosis not present

## 2023-05-26 DIAGNOSIS — S199XXA Unspecified injury of neck, initial encounter: Secondary | ICD-10-CM | POA: Diagnosis not present

## 2023-05-26 DIAGNOSIS — S3993XA Unspecified injury of pelvis, initial encounter: Secondary | ICD-10-CM | POA: Diagnosis not present

## 2023-05-26 DIAGNOSIS — S22088A Other fracture of T11-T12 vertebra, initial encounter for closed fracture: Secondary | ICD-10-CM | POA: Diagnosis not present

## 2023-05-26 DIAGNOSIS — S82301A Unspecified fracture of lower end of right tibia, initial encounter for closed fracture: Secondary | ICD-10-CM | POA: Diagnosis not present

## 2023-05-26 DIAGNOSIS — Z7722 Contact with and (suspected) exposure to environmental tobacco smoke (acute) (chronic): Secondary | ICD-10-CM | POA: Diagnosis present

## 2023-05-26 DIAGNOSIS — F411 Generalized anxiety disorder: Secondary | ICD-10-CM | POA: Diagnosis not present

## 2023-05-26 DIAGNOSIS — S82852D Displaced trimalleolar fracture of left lower leg, subsequent encounter for closed fracture with routine healing: Secondary | ICD-10-CM | POA: Diagnosis not present

## 2023-05-26 DIAGNOSIS — S0990XA Unspecified injury of head, initial encounter: Secondary | ICD-10-CM | POA: Diagnosis not present

## 2023-05-26 DIAGNOSIS — S72352D Displaced comminuted fracture of shaft of left femur, subsequent encounter for closed fracture with routine healing: Secondary | ICD-10-CM | POA: Diagnosis not present

## 2023-05-26 DIAGNOSIS — F418 Other specified anxiety disorders: Secondary | ICD-10-CM | POA: Diagnosis not present

## 2023-05-26 DIAGNOSIS — D72829 Elevated white blood cell count, unspecified: Secondary | ICD-10-CM | POA: Diagnosis not present

## 2023-05-26 DIAGNOSIS — M7732 Calcaneal spur, left foot: Secondary | ICD-10-CM | POA: Diagnosis not present

## 2023-05-26 DIAGNOSIS — M79605 Pain in left leg: Secondary | ICD-10-CM | POA: Diagnosis present

## 2023-05-26 DIAGNOSIS — S82891D Other fracture of right lower leg, subsequent encounter for closed fracture with routine healing: Secondary | ICD-10-CM | POA: Diagnosis not present

## 2023-05-26 DIAGNOSIS — K5901 Slow transit constipation: Secondary | ICD-10-CM | POA: Diagnosis not present

## 2023-05-26 DIAGNOSIS — S92101A Unspecified fracture of right talus, initial encounter for closed fracture: Secondary | ICD-10-CM | POA: Diagnosis not present

## 2023-05-26 DIAGNOSIS — S82492A Other fracture of shaft of left fibula, initial encounter for closed fracture: Secondary | ICD-10-CM | POA: Diagnosis not present

## 2023-05-26 DIAGNOSIS — S82291A Other fracture of shaft of right tibia, initial encounter for closed fracture: Secondary | ICD-10-CM | POA: Diagnosis not present

## 2023-05-26 DIAGNOSIS — S82452A Displaced comminuted fracture of shaft of left fibula, initial encounter for closed fracture: Secondary | ICD-10-CM | POA: Diagnosis not present

## 2023-05-26 DIAGNOSIS — M545 Low back pain, unspecified: Secondary | ICD-10-CM | POA: Diagnosis present

## 2023-05-26 LAB — COMPREHENSIVE METABOLIC PANEL
ALT: 7 U/L (ref 0–44)
AST: 21 U/L (ref 15–41)
Albumin: 2.9 g/dL — ABNORMAL LOW (ref 3.5–5.0)
Alkaline Phosphatase: 76 U/L (ref 38–126)
Anion gap: 10 (ref 5–15)
BUN: 5 mg/dL — ABNORMAL LOW (ref 6–20)
CO2: 23 mmol/L (ref 22–32)
Calcium: 8.1 mg/dL — ABNORMAL LOW (ref 8.9–10.3)
Chloride: 105 mmol/L (ref 98–111)
Creatinine, Ser: 0.61 mg/dL (ref 0.44–1.00)
GFR, Estimated: >60 mL/min (ref >60)
Glucose, Bld: 90 mg/dL (ref 70–99)
Potassium: 3.3 mmol/L — ABNORMAL LOW (ref 3.5–5.1)
Sodium: 138 mmol/L (ref 135–145)
Total Bilirubin: 0.9 mg/dL (ref 0.3–1.2)
Total Protein: 6.3 g/dL — ABNORMAL LOW (ref 6.5–8.1)

## 2023-05-26 LAB — CBC
HCT: 36.6 % (ref 36.0–46.0)
Hemoglobin: 12.2 g/dL (ref 12.0–15.0)
MCH: 32.9 pg (ref 26.0–34.0)
MCHC: 33.3 g/dL (ref 30.0–36.0)
MCV: 98.7 fL (ref 80.0–100.0)
Platelets: 265 10*3/uL (ref 150–400)
RBC: 3.71 MIL/uL — ABNORMAL LOW (ref 3.87–5.11)
RDW: 12.7 % (ref 11.5–15.5)
WBC: 8.9 10*3/uL (ref 4.0–10.5)
nRBC: 0 % (ref 0.0–0.2)

## 2023-05-26 LAB — I-STAT CHEM 8, ED
BUN: 3 mg/dL — ABNORMAL LOW (ref 6–20)
Calcium, Ion: 0.99 mmol/L — ABNORMAL LOW (ref 1.15–1.40)
Chloride: 106 mmol/L (ref 98–111)
Creatinine, Ser: 0.6 mg/dL (ref 0.44–1.00)
Glucose, Bld: 90 mg/dL (ref 70–99)
HCT: 35 % — ABNORMAL LOW (ref 36.0–46.0)
Hemoglobin: 11.9 g/dL — ABNORMAL LOW (ref 12.0–15.0)
Potassium: 3.3 mmol/L — ABNORMAL LOW (ref 3.5–5.1)
Sodium: 141 mmol/L (ref 135–145)
TCO2: 25 mmol/L (ref 22–32)

## 2023-05-26 LAB — HCG, SERUM, QUALITATIVE: Preg, Serum: NEGATIVE

## 2023-05-26 MED ORDER — PROPOFOL 10 MG/ML IV BOLUS
100.0000 mg | Freq: Once | INTRAVENOUS | Status: AC
  Administered 2023-05-26: 50 mg via INTRAVENOUS
  Filled 2023-05-26: qty 20

## 2023-05-26 MED ORDER — HYDROMORPHONE HCL 1 MG/ML IJ SOLN
1.0000 mg | Freq: Once | INTRAMUSCULAR | Status: AC
  Administered 2023-05-26: 1 mg via INTRAVENOUS
  Filled 2023-05-26: qty 1

## 2023-05-26 MED ORDER — PROPOFOL 10 MG/ML IV BOLUS
100.0000 mg | Freq: Once | INTRAVENOUS | Status: AC
  Administered 2023-05-26: 25 mg via INTRAVENOUS
  Filled 2023-05-26: qty 20

## 2023-05-26 MED ORDER — IBUPROFEN 600 MG PO TABS: 600.0000 mg | ORAL_TABLET | Freq: Four times a day (QID) | ORAL | Status: DC | PRN

## 2023-05-26 MED ORDER — ONDANSETRON HCL 4 MG/2ML IJ SOLN: 4.0000 mg | Freq: Four times a day (QID) | INTRAMUSCULAR | Status: DC | PRN

## 2023-05-26 MED ORDER — TETANUS-DIPHTH-ACELL PERTUSSIS 5-2.5-18.5 LF-MCG/0.5 IM SUSY
PREFILLED_SYRINGE | INTRAMUSCULAR | Status: AC
  Filled 2023-05-26: qty 0.5

## 2023-05-26 MED ORDER — DIAZEPAM 5 MG PO TABS: 10.0000 mg | ORAL_TABLET | Freq: Two times a day (BID) | ORAL | Status: DC | PRN

## 2023-05-26 MED ORDER — ONDANSETRON 4 MG PO TBDP: 4.0000 mg | ORAL_TABLET | Freq: Four times a day (QID) | ORAL | Status: DC | PRN

## 2023-05-26 MED ORDER — HYDROMORPHONE HCL 1 MG/ML IJ SOLN
0.5000 mg | INTRAMUSCULAR | Status: DC | PRN
  Administered 2023-05-27 – 2023-05-28 (×5): 1 mg via INTRAVENOUS
  Filled 2023-05-26 (×5): qty 1

## 2023-05-26 MED ORDER — ONDANSETRON HCL 4 MG/2ML IJ SOLN
4.0000 mg | Freq: Once | INTRAMUSCULAR | Status: AC
  Administered 2023-05-26: 4 mg via INTRAVENOUS
  Filled 2023-05-26: qty 2

## 2023-05-26 MED ORDER — POLYETHYLENE GLYCOL 3350 17 G PO PACK: 17.0000 g | PACK | Freq: Every day | ORAL | Status: DC | PRN

## 2023-05-26 MED ORDER — MELATONIN 3 MG PO TABS: 3.0000 mg | ORAL_TABLET | Freq: Every evening | ORAL | Status: DC | PRN

## 2023-05-26 MED ORDER — HYDROMORPHONE HCL 1 MG/ML IJ SOLN
0.5000 mg | Freq: Once | INTRAMUSCULAR | Status: AC
  Administered 2023-05-26: 0.5 mg via INTRAVENOUS
  Filled 2023-05-26: qty 1

## 2023-05-26 MED ORDER — LORATADINE 10 MG PO TABS
10.0000 mg | ORAL_TABLET | Freq: Every day | ORAL | Status: DC
  Administered 2023-05-29 – 2023-06-02 (×5): 10 mg via ORAL
  Filled 2023-05-26 (×5): qty 1

## 2023-05-26 MED ORDER — HYDRALAZINE HCL 20 MG/ML IJ SOLN: 10.0000 mg | INTRAMUSCULAR | Status: DC | PRN

## 2023-05-26 MED ORDER — OXYCODONE HCL 5 MG PO TABS: 5.0000 mg | ORAL_TABLET | Freq: Four times a day (QID) | ORAL | Status: DC | PRN

## 2023-05-26 MED ORDER — FUROSEMIDE 40 MG PO TABS
40.0000 mg | ORAL_TABLET | Freq: Every day | ORAL | Status: DC
  Administered 2023-05-29 – 2023-06-02 (×5): 40 mg via ORAL
  Filled 2023-05-26 (×5): qty 1

## 2023-05-26 MED ORDER — FENTANYL CITRATE PF 50 MCG/ML IJ SOSY
100.0000 ug | PREFILLED_SYRINGE | Freq: Once | INTRAMUSCULAR | Status: AC
  Administered 2023-05-26: 100 ug via INTRAVENOUS
  Filled 2023-05-26: qty 2

## 2023-05-26 MED ORDER — ASPIRIN 81 MG PO CHEW
81.0000 mg | CHEWABLE_TABLET | Freq: Every day | ORAL | Status: DC
  Administered 2023-05-29 – 2023-06-02 (×5): 81 mg via ORAL
  Filled 2023-05-26 (×5): qty 1

## 2023-05-26 MED ORDER — DOCUSATE SODIUM 100 MG PO CAPS
100.0000 mg | ORAL_CAPSULE | Freq: Two times a day (BID) | ORAL | Status: DC
  Administered 2023-05-26 – 2023-05-31 (×8): 100 mg via ORAL
  Filled 2023-05-26 (×8): qty 1

## 2023-05-26 MED ORDER — PROPOFOL 10 MG/ML IV BOLUS
INTRAVENOUS | Status: DC | PRN
  Administered 2023-05-26: 50 mg via INTRAVENOUS
  Administered 2023-05-26: 25 mg via INTRAVENOUS
  Administered 2023-05-26: 50 mg via INTRAVENOUS

## 2023-05-26 MED ORDER — FLUTICASONE PROPIONATE 50 MCG/ACT NA SUSP
2.0000 | Freq: Every day | NASAL | Status: DC
  Administered 2023-05-28 – 2023-06-02 (×5): 2 via NASAL
  Filled 2023-05-26 (×3): qty 16

## 2023-05-26 MED ORDER — METHOCARBAMOL 500 MG PO TABS
500.0000 mg | ORAL_TABLET | Freq: Three times a day (TID) | ORAL | Status: DC | PRN
  Administered 2023-05-26 – 2023-05-27 (×2): 500 mg via ORAL
  Filled 2023-05-26 (×2): qty 1

## 2023-05-26 MED ORDER — DIPHENHYDRAMINE HCL 25 MG PO CAPS: 25.0000 mg | ORAL_CAPSULE | Freq: Four times a day (QID) | ORAL | Status: DC | PRN

## 2023-05-26 MED ORDER — FENTANYL CITRATE PF 50 MCG/ML IJ SOSY
50.0000 ug | PREFILLED_SYRINGE | Freq: Once | INTRAMUSCULAR | Status: AC
  Administered 2023-05-26: 50 ug via INTRAVENOUS
  Filled 2023-05-26: qty 1

## 2023-05-26 MED ORDER — TETANUS-DIPHTH-ACELL PERTUSSIS 5-2.5-18.5 LF-MCG/0.5 IM SUSY
0.5000 mL | PREFILLED_SYRINGE | Freq: Once | INTRAMUSCULAR | Status: AC
  Administered 2023-05-26: 0.5 mL via INTRAMUSCULAR

## 2023-05-26 MED ORDER — POTASSIUM CHLORIDE CRYS ER 20 MEQ PO TBCR
20.0000 meq | EXTENDED_RELEASE_TABLET | Freq: Two times a day (BID) | ORAL | Status: DC
  Administered 2023-05-26 – 2023-06-02 (×11): 20 meq via ORAL
  Filled 2023-05-26 (×11): qty 1

## 2023-05-26 MED ORDER — MECLIZINE HCL 25 MG PO TABS: 25.0000 mg | ORAL_TABLET | Freq: Four times a day (QID) | ORAL | Status: DC | PRN

## 2023-05-26 MED ORDER — ENOXAPARIN SODIUM 30 MG/0.3ML IJ SOSY
30.0000 mg | PREFILLED_SYRINGE | Freq: Two times a day (BID) | INTRAMUSCULAR | Status: DC
  Administered 2023-05-27: 30 mg via SUBCUTANEOUS
  Filled 2023-05-26: qty 0.3

## 2023-05-26 MED ORDER — LACTATED RINGERS IV SOLN: INTRAVENOUS | Status: DC

## 2023-05-26 MED ORDER — METOPROLOL TARTRATE 5 MG/5ML IV SOLN: 5.0000 mg | Freq: Four times a day (QID) | INTRAVENOUS | Status: DC | PRN
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
## 2023-05-26 NOTE — Progress Notes (Signed)
I was notified of patient's left tibia fracture and right ankle fracture/dislocation by Dr. Jarold Motto. Per Dr. Jarold Motto, patient has good pulses and perfusion distally bilaterally. Patient is pending a reduction of right ankle joint currently. I have reviewed images, and discussed patient with Dr. Dion Saucier, as this patient is an established patient to his group. He has asked that the admitting team place the patient NPO after midnight for anticipated surgery tomorrow on at least the right ankle. The left tibia fracture may require subsequent surgery by a trauma specialist. Additional recs will be forthcoming by Dr. Dion Saucier.

## 2023-05-26 NOTE — Progress Notes (Signed)
Orthopedic Tech Progress Note Patient Details:  DAVY FAUGHT [birth year] [id number]  Well-padded posterior short leg splints with stirrups applied to BLE following conscious sedation and reduction of bilateral ankles by Dr. Eloise Harman. Distal pulses identified and marked via doppler on BLE. Sensation and motion of digits remain intact following splinting. Pt also has an abrasion to the lateral aspect of her R ankle that was covered with xeroform and gauze.  Ortho Devices Type of Ortho Device: Stirrup splint, Post (short leg) splint Ortho Device/Splint Location: BLE Ortho Device/Splint Interventions: Ordered, Application, Adjustment   Post Interventions Patient Tolerated: Well Instructions Provided: Care of device  Doralyn Kirkes Carmine Savoy 05/26/2023, 6:59 PM
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
## 2023-05-26 NOTE — ED Notes (Signed)
RT present at 1825 for conscious sedation. Pt placed on ETCO2 monitoring. Suction and ambu bag present at pt bedside. RT released from procedure by Dr. Jarold Motto at [phone]. RT will continue to be available as needed.

## 2023-05-26 NOTE — ED Notes (Signed)
BACK FROM ct PULSE SITES MARKED RIGHT IS FAINTER THEN THE LEFT

## 2023-05-26 NOTE — Sedation Documentation (Signed)
XRAY AT BEDSIDE
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
## 2023-05-26 NOTE — Sedation Documentation (Signed)
RIGHT ANKLE COMPLETE

## 2023-05-26 NOTE — ED Notes (Signed)
X-ray at bedside
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
## 2023-05-26 NOTE — Sedation Documentation (Signed)
LEFT ANKLE COMPLETE

## 2023-05-26 NOTE — ED Notes (Signed)
Placed on 2L saturation drop into high 80's

## 2023-05-26 NOTE — ED Notes (Signed)
Patient transported to CT 

## 2023-05-27 ENCOUNTER — Inpatient Hospital Stay (HOSPITAL_COMMUNITY): Payer: Commercial Managed Care - HMO

## 2023-05-27 ENCOUNTER — Inpatient Hospital Stay (HOSPITAL_COMMUNITY): Payer: Commercial Managed Care - HMO | Admitting: Anesthesiology

## 2023-05-27 ENCOUNTER — Encounter (HOSPITAL_COMMUNITY): Admission: EM | Disposition: A | Discharge status: Rehabilitation Facility | Payer: Self-pay | Source: Home / Self Care

## 2023-05-27 ENCOUNTER — Encounter (HOSPITAL_COMMUNITY): Payer: Self-pay

## 2023-05-27 ENCOUNTER — Other Ambulatory Visit: Payer: Self-pay

## 2023-05-27 DIAGNOSIS — S82872A Displaced pilon fracture of left tibia, initial encounter for closed fracture: Secondary | ICD-10-CM | POA: Diagnosis not present

## 2023-05-27 DIAGNOSIS — Z6841 Body Mass Index (BMI) 40.0 and over, adult: Secondary | ICD-10-CM

## 2023-05-27 DIAGNOSIS — S82841A Displaced bimalleolar fracture of right lower leg, initial encounter for closed fracture: Secondary | ICD-10-CM

## 2023-05-27 DIAGNOSIS — I10 Essential (primary) hypertension: Secondary | ICD-10-CM

## 2023-05-27 HISTORY — PX: EXTERNAL FIXATION LEG: SHX1549

## 2023-05-27 LAB — HIV ANTIBODY (ROUTINE TESTING W REFLEX): HIV Screen 4th Generation wRfx: NONREACTIVE

## 2023-05-27 LAB — CBC
HCT: 41.3 % (ref 36.0–46.0)
Hemoglobin: 13.2 g/dL (ref 12.0–15.0)
MCH: 31.5 pg (ref 26.0–34.0)
MCHC: 32 g/dL (ref 30.0–36.0)
MCV: 98.6 fL (ref 80.0–100.0)
Platelets: 293 10*3/uL (ref 150–400)
RBC: 4.19 MIL/uL (ref 3.87–5.11)
RDW: 12.7 % (ref 11.5–15.5)
WBC: 9.4 10*3/uL (ref 4.0–10.5)
nRBC: 0 % (ref 0.0–0.2)

## 2023-05-27 LAB — BASIC METABOLIC PANEL
Anion gap: 14 (ref 5–15)
BUN: <5 mg/dL — ABNORMAL LOW (ref 6–20)
CO2: 25 mmol/L (ref 22–32)
Calcium: 8.8 mg/dL — ABNORMAL LOW (ref 8.9–10.3)
Chloride: 101 mmol/L (ref 98–111)
Creatinine, Ser: 0.68 mg/dL (ref 0.44–1.00)
GFR, Estimated: >60 mL/min (ref >60)
Glucose, Bld: 92 mg/dL (ref 70–99)
Potassium: 3.4 mmol/L — ABNORMAL LOW (ref 3.5–5.1)
Sodium: 140 mmol/L (ref 135–145)

## 2023-05-27 LAB — SURGICAL PCR SCREEN
MRSA, PCR: NEGATIVE
Staphylococcus aureus: NEGATIVE

## 2023-05-27 SURGERY — EXTERNAL FIXATION, LOWER EXTREMITY
Anesthesia: General | Site: Ankle | Laterality: Bilateral

## 2023-05-27 MED ORDER — OXYCODONE HCL 5 MG/5ML PO SOLN: 5.0000 mg | Freq: Once | ORAL | Status: DC | PRN

## 2023-05-27 MED ORDER — PROPOFOL 10 MG/ML IV BOLUS
INTRAVENOUS | Status: AC
  Filled 2023-05-27: qty 20

## 2023-05-27 MED ORDER — CHLORHEXIDINE GLUCONATE 0.12 % MT SOLN: 15.0000 mL | Freq: Once | OROMUCOSAL | Status: AC

## 2023-05-27 MED ORDER — ROCURONIUM BROMIDE 10 MG/ML (PF) SYRINGE
PREFILLED_SYRINGE | INTRAVENOUS | Status: DC | PRN
  Administered 2023-05-27: 20 mg via INTRAVENOUS
  Administered 2023-05-27: 60 mg via INTRAVENOUS

## 2023-05-27 MED ORDER — 0.9 % SODIUM CHLORIDE (POUR BTL) OPTIME
TOPICAL | Status: DC | PRN
  Administered 2023-05-27: 1000 mL

## 2023-05-27 MED ORDER — CELECOXIB 200 MG PO CAPS
ORAL_CAPSULE | ORAL | Status: AC
  Administered 2023-05-27: 200 mg via ORAL
  Filled 2023-05-27: qty 1

## 2023-05-27 MED ORDER — CHLORHEXIDINE GLUCONATE 4 % EX SOLN
60.0000 mL | Freq: Once | CUTANEOUS | Status: AC
  Administered 2023-05-27: 4 via TOPICAL
  Filled 2023-05-27: qty 60

## 2023-05-27 MED ORDER — ACETAMINOPHEN 500 MG PO TABS: 1000.0000 mg | ORAL_TABLET | Freq: Once | ORAL | Status: DC

## 2023-05-27 MED ORDER — MAGNESIUM CITRATE PO SOLN: 1.0000 | Freq: Once | ORAL | Status: DC | PRN

## 2023-05-27 MED ORDER — POVIDONE-IODINE 10 % EX SWAB
2.0000 | Freq: Once | CUTANEOUS | Status: AC
  Administered 2023-05-27: 2 via TOPICAL

## 2023-05-27 MED ORDER — MIDAZOLAM HCL 2 MG/2ML IJ SOLN
INTRAMUSCULAR | Status: DC | PRN
  Administered 2023-05-27: 2 mg via INTRAVENOUS

## 2023-05-27 MED ORDER — HYDROMORPHONE HCL 1 MG/ML IJ SOLN
INTRAMUSCULAR | Status: AC
  Filled 2023-05-27: qty 0.5

## 2023-05-27 MED ORDER — AMISULPRIDE (ANTIEMETIC) 5 MG/2ML IV SOLN
10.0000 mg | Freq: Once | INTRAVENOUS | Status: AC | PRN
  Administered 2023-05-27: 10 mg via INTRAVENOUS

## 2023-05-27 MED ORDER — CELECOXIB 200 MG PO CAPS: 200.0000 mg | ORAL_CAPSULE | Freq: Once | ORAL | Status: AC

## 2023-05-27 MED ORDER — MIDAZOLAM HCL 2 MG/2ML IJ SOLN
INTRAMUSCULAR | Status: AC
  Filled 2023-05-27: qty 2

## 2023-05-27 MED ORDER — BISACODYL 10 MG RE SUPP: 10.0000 mg | Freq: Every day | RECTAL | Status: DC | PRN

## 2023-05-27 MED ORDER — ORAL CARE MOUTH RINSE: 15.0000 mL | Freq: Once | OROMUCOSAL | Status: AC

## 2023-05-27 MED ORDER — FENTANYL CITRATE (PF) 100 MCG/2ML IJ SOLN
25.0000 ug | INTRAMUSCULAR | Status: DC | PRN
  Administered 2023-05-27: 50 ug via INTRAVENOUS

## 2023-05-27 MED ORDER — METHOCARBAMOL 500 MG PO TABS
ORAL_TABLET | ORAL | Status: AC
  Filled 2023-05-27: qty 1

## 2023-05-27 MED ORDER — DIPHENHYDRAMINE HCL 50 MG/ML IJ SOLN
INTRAMUSCULAR | Status: DC | PRN
  Administered 2023-05-27: 12.5 mg via INTRAVENOUS

## 2023-05-27 MED ORDER — AMISULPRIDE (ANTIEMETIC) 5 MG/2ML IV SOLN
INTRAVENOUS | Status: AC
  Filled 2023-05-27: qty 4

## 2023-05-27 MED ORDER — HYDROMORPHONE HCL 1 MG/ML IJ SOLN
INTRAMUSCULAR | Status: DC | PRN
  Administered 2023-05-27: .5 mg via INTRAVENOUS

## 2023-05-27 MED ORDER — LACTATED RINGERS IV SOLN: INTRAVENOUS | Status: DC

## 2023-05-27 MED ORDER — ONDANSETRON HCL 4 MG/2ML IJ SOLN
INTRAMUSCULAR | Status: DC | PRN
  Administered 2023-05-27: 4 mg via INTRAVENOUS

## 2023-05-27 MED ORDER — FENTANYL CITRATE (PF) 250 MCG/5ML IJ SOLN
INTRAMUSCULAR | Status: AC
  Filled 2023-05-27: qty 5

## 2023-05-27 MED ORDER — PHENYLEPHRINE HCL-NACL 20-0.9 MG/250ML-% IV SOLN
INTRAVENOUS | Status: AC
  Filled 2023-05-27: qty 250

## 2023-05-27 MED ORDER — POLYETHYLENE GLYCOL 3350 17 G PO PACK
17.0000 g | PACK | Freq: Every day | ORAL | Status: DC | PRN
  Administered 2023-05-30: 17 g via ORAL
  Filled 2023-05-27: qty 1

## 2023-05-27 MED ORDER — FENTANYL CITRATE (PF) 250 MCG/5ML IJ SOLN
INTRAMUSCULAR | Status: DC | PRN
  Administered 2023-05-27: 100 ug via INTRAVENOUS
  Administered 2023-05-27: 150 ug via INTRAVENOUS

## 2023-05-27 MED ORDER — FENTANYL CITRATE (PF) 100 MCG/2ML IJ SOLN
INTRAMUSCULAR | Status: AC
  Filled 2023-05-27: qty 2

## 2023-05-27 MED ORDER — CEFAZOLIN SODIUM-DEXTROSE 2-4 GM/100ML-% IV SOLN
INTRAVENOUS | Status: AC
  Filled 2023-05-27: qty 100

## 2023-05-27 MED ORDER — HYDROCORTISONE SOD SUC (PF) 250 MG IJ SOLR
INTRAMUSCULAR | Status: AC
  Filled 2023-05-27: qty 250

## 2023-05-27 MED ORDER — METOCLOPRAMIDE HCL 5 MG/ML IJ SOLN: 5.0000 mg | Freq: Three times a day (TID) | INTRAMUSCULAR | Status: DC | PRN

## 2023-05-27 MED ORDER — PROPOFOL 10 MG/ML IV BOLUS
INTRAVENOUS | Status: DC | PRN
  Administered 2023-05-27: 50 mg via INTRAVENOUS
  Administered 2023-05-27: 150 mg via INTRAVENOUS

## 2023-05-27 MED ORDER — METOCLOPRAMIDE HCL 5 MG PO TABS: 5.0000 mg | ORAL_TABLET | Freq: Three times a day (TID) | ORAL | Status: DC | PRN

## 2023-05-27 MED ORDER — CEFAZOLIN SODIUM-DEXTROSE 2-4 GM/100ML-% IV SOLN
2.0000 g | INTRAVENOUS | Status: AC
  Administered 2023-05-27: 2 g via INTRAVENOUS

## 2023-05-27 MED ORDER — LIDOCAINE 2% (20 MG/ML) 5 ML SYRINGE
INTRAMUSCULAR | Status: DC | PRN
  Administered 2023-05-27 (×3): 60 mg via INTRAVENOUS

## 2023-05-27 MED ORDER — DEXMEDETOMIDINE HCL IN NACL 80 MCG/20ML IV SOLN
INTRAVENOUS | Status: DC | PRN
  Administered 2023-05-27: 8 ug via INTRAVENOUS
  Administered 2023-05-27: 12 ug via INTRAVENOUS

## 2023-05-27 MED ORDER — DEXAMETHASONE SODIUM PHOSPHATE 10 MG/ML IJ SOLN
INTRAMUSCULAR | Status: DC | PRN
  Administered 2023-05-27: 10 mg via INTRAVENOUS

## 2023-05-27 MED ORDER — CEFAZOLIN SODIUM-DEXTROSE 2-4 GM/100ML-% IV SOLN
2.0000 g | Freq: Four times a day (QID) | INTRAVENOUS | Status: AC
  Administered 2023-05-27 – 2023-05-28 (×3): 2 g via INTRAVENOUS
  Filled 2023-05-27 (×3): qty 100

## 2023-05-27 MED ORDER — CHLORHEXIDINE GLUCONATE 0.12 % MT SOLN
OROMUCOSAL | Status: AC
  Administered 2023-05-27: 15 mL via OROMUCOSAL
  Filled 2023-05-27: qty 15

## 2023-05-27 MED ORDER — SUGAMMADEX SODIUM 200 MG/2ML IV SOLN
INTRAVENOUS | Status: DC | PRN
  Administered 2023-05-27: 200 mg via INTRAVENOUS

## 2023-05-27 MED ORDER — PHENYLEPHRINE HCL-NACL 20-0.9 MG/250ML-% IV SOLN
INTRAVENOUS | Status: DC | PRN
  Administered 2023-05-27: 10 ug/min via INTRAVENOUS

## 2023-05-27 MED ORDER — OXYCODONE HCL 5 MG PO TABS: 5.0000 mg | ORAL_TABLET | Freq: Once | ORAL | Status: DC | PRN

## 2023-05-27 SURGICAL SUPPLY — 56 items
BAR EXFX 300X11 NS LF (EXFIX) ×2
BAR EXFX 350X11 NS LF (EXFIX) ×2
BAR GLASS FIBER EXFX 11X300 (EXFIX) IMPLANT
BAR GLASS FIBER EXFX 11X350 (EXFIX) IMPLANT
BNDG CMPR 5X4 CHSV STRCH STRL (GAUZE/BANDAGES/DRESSINGS) ×2
BNDG CMPR 5X4 KNIT ELC UNQ LF (GAUZE/BANDAGES/DRESSINGS) ×2
BNDG CMPR 5X6 CHSV STRCH STRL (GAUZE/BANDAGES/DRESSINGS)
BNDG COHESIVE 4X5 TAN STRL LF (GAUZE/BANDAGES/DRESSINGS) IMPLANT
BNDG COHESIVE 6X5 TAN ST LF (GAUZE/BANDAGES/DRESSINGS) ×2 IMPLANT
BNDG ELASTIC 4INX 5YD STR LF (GAUZE/BANDAGES/DRESSINGS) IMPLANT
BNDG ELASTIC 4X5.8 VLCR STR LF (GAUZE/BANDAGES/DRESSINGS) ×2 IMPLANT
BNDG ELASTIC 6X5.8 VLCR STR LF (GAUZE/BANDAGES/DRESSINGS) ×2 IMPLANT
BNDG GAUZE DERMACEA FLUFF 4 (GAUZE/BANDAGES/DRESSINGS) ×4 IMPLANT
BNDG GZE DERMACEA 4 6PLY (GAUZE/BANDAGES/DRESSINGS) ×2
CAP PROTECTIVE TRANSFX 4.5X5MM (EXFIX) IMPLANT
CLAMP BLUE BAR TO PIN (EXFIX) IMPLANT
COVER SURGICAL LIGHT HANDLE (MISCELLANEOUS) ×2 IMPLANT
CUFF TOURN SGL QUICK 34 (TOURNIQUET CUFF)
CUFF TRNQT CYL 34X4.125X (TOURNIQUET CUFF) IMPLANT
DRAPE BILATERAL LIMB T (DRAPES) IMPLANT
DRAPE C-ARM 42X72 X-RAY (DRAPES) IMPLANT
DRAPE C-ARMOR (DRAPES) IMPLANT
DRAPE U-SHAPE 47X51 STRL (DRAPES) ×2 IMPLANT
DRSG ADAPTIC 3X8 NADH LF (GAUZE/BANDAGES/DRESSINGS) ×2 IMPLANT
EVACUATOR 1/8 PVC DRAIN (DRAIN) IMPLANT
GAUZE SPONGE 4X4 12PLY STRL (GAUZE/BANDAGES/DRESSINGS) ×2 IMPLANT
GAUZE XEROFORM 5X9 LF (GAUZE/BANDAGES/DRESSINGS) IMPLANT
GLOVE BIO SURGEON STRL SZ7 (GLOVE) ×6 IMPLANT
GLOVE BIOGEL PI IND STRL 7.0 (GLOVE) ×4 IMPLANT
GLOVE ORTHO TXT STRL SZ7.5 (GLOVE) ×2 IMPLANT
GOWN STRL REUS W/ TWL LRG LVL3 (GOWN DISPOSABLE) ×4 IMPLANT
GOWN STRL REUS W/TWL LRG LVL3 (GOWN DISPOSABLE) ×2
HALF PIN 5.0X160 (EXFIX) IMPLANT
HANDPIECE INTERPULSE COAX TIP (DISPOSABLE)
KIT BASIN OR (CUSTOM PROCEDURE TRAY) ×2 IMPLANT
KIT TURNOVER KIT B (KITS) ×2 IMPLANT
NDL 22X1.5 STRL (OR ONLY) (MISCELLANEOUS) IMPLANT
NEEDLE 22X1.5 STRL (OR ONLY) (MISCELLANEOUS) IMPLANT
NS IRRIG 1000ML POUR BTL (IV SOLUTION) ×2 IMPLANT
PACK ORTHO EXTREMITY (CUSTOM PROCEDURE TRAY) ×2 IMPLANT
PAD ARMBOARD 7.5X6 YLW CONV (MISCELLANEOUS) ×4 IMPLANT
PADDING CAST COTTON 6X4 STRL (CAST SUPPLIES) ×6 IMPLANT
PIN CLAMP 2BAR 75MM BLUE (EXFIX) IMPLANT
PIN TRANSFIXING 5.0 (EXFIX) IMPLANT
SET HNDPC FAN SPRY TIP SCT (DISPOSABLE) IMPLANT
SPONGE T-LAP 18X18 ~~LOC~~+RFID (SPONGE) IMPLANT
STAPLER VISISTAT 35W (STAPLE) IMPLANT
STOCKINETTE IMPERVIOUS LG (DRAPES) ×2 IMPLANT
SUT ETHILON 3 0 PS 1 (SUTURE) IMPLANT
TOWEL GREEN STERILE (TOWEL DISPOSABLE) ×4 IMPLANT
TOWEL GREEN STERILE FF (TOWEL DISPOSABLE) ×4 IMPLANT
TUBE CONNECTING 12X1/4 (SUCTIONS) ×2 IMPLANT
TUBING TUR DISP (UROLOGICAL SUPPLIES) ×2 IMPLANT
UNDERPAD 30X36 HEAVY ABSORB (UNDERPADS AND DIAPERS) ×2 IMPLANT
WATER STERILE IRR 1000ML POUR (IV SOLUTION) ×4 IMPLANT
YANKAUER SUCT BULB TIP NO VENT (SUCTIONS) ×2 IMPLANT

## 2023-05-27 NOTE — Progress Notes (Signed)
OT Cancellation Note  Patient Details Name: Katie [name] MRN: [id number] DOB: 1966-05-17   Cancelled Treatment:    Reason Eval/Treat Not Completed: Patient at procedure or test/ unavailable (Pt is off floor for surgery. OT will follow back for evaluation tomorrow.)  05/27/2023  AB, OTR/L  Acute Rehabilitation Services  Office: [phone]  Katie [name] 05/27/2023, 2:53 PM

## 2023-05-27 NOTE — Anesthesia Postprocedure Evaluation (Signed)
Anesthesia Post Note  Patient: Katie [name]  Procedure(s) Performed: EXTERNAL FIXATION ANKLE (Bilateral: Ankle)     Patient location during evaluation: PACU Anesthesia Type: General Level of consciousness: awake and alert Pain management: pain level controlled Vital Signs Assessment: post-procedure vital signs reviewed and stable Respiratory status: spontaneous breathing, nonlabored ventilation and respiratory function stable Cardiovascular status: stable and blood pressure returned to baseline Anesthetic complications: no   No notable events documented.  Last Vitals:  Vitals:   05/27/23 1630 05/27/23 1655  BP: (!) 149/51 (!) 158/95  Pulse: (!) 57 (!) 56  Resp: 11 16  Temp: 36.7 C 36.4 C  SpO2: 94% 98%    Last Pain:  Vitals:   05/27/23 1655  TempSrc: Oral  PainSc:                  [name]
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
## 2023-05-27 NOTE — Anesthesia Procedure Notes (Signed)
Procedure Name: Intubation Date/Time: 05/27/2023 1:58 PM  Performed by: Wilder Glade, CRNAPre-anesthesia Checklist: Patient identified, Emergency Drugs available, Suction available, Patient being monitored and Timeout performed Patient Re-evaluated:Patient Re-evaluated prior to induction Oxygen Delivery Method: Circle system utilized Preoxygenation: Pre-oxygenation with 100% oxygen Induction Type: IV induction Ventilation: Mask ventilation without difficulty Laryngoscope Size: Miller and 2 Grade View: Grade I Tube type: Oral Tube size: 7.0 mm Number of attempts: 1 Airway Equipment and Method: Stylet Placement Confirmation: ETT inserted through vocal cords under direct vision, positive ETCO2 and breath sounds checked- equal and bilateral Secured at: 21 cm Tube secured with: Tape Dental Injury: Teeth and Oropharynx as per pre-operative assessment  Comments: Brief atraumatic vc open ETT easily passed with ky lubricant from disposable pack sterile on cuff and end of tube, eyes taped shut before DVL, +BBS=

## 2023-05-27 NOTE — Progress Notes (Signed)
Hibiclens bath completed. CHG Bath also done earlier

## 2023-05-27 NOTE — Anesthesia Preprocedure Evaluation (Addendum)
Anesthesia Evaluation  Patient identified by MRN, date of birth, ID band Patient awake    Reviewed: Allergy & Precautions, NPO status , Patient's Chart, lab work & pertinent test results  History of Anesthesia Complications Negative for: history of anesthetic complications  Airway Mallampati: II  TM Distance: >3 FB Neck ROM: Full    Dental  (+) Dental Advisory Given, Teeth Intact   Pulmonary Current Smoker and Patient abstained from smoking., PE   Pulmonary exam normal        Cardiovascular hypertension, Pt. on medications Normal cardiovascular exam     Neuro/Psych  PSYCHIATRIC DISORDERS Anxiety Depression Bipolar Disorder   negative neurological ROS     GI/Hepatic ,GERD  Controlled and Medicated,,(+)     substance abuse  marijuana use  Endo/Other    Morbid obesity  Renal/GU negative Renal ROS     Musculoskeletal  (+) Arthritis , Osteoarthritis,    Abdominal  (+) + obese  Peds  Hematology  On xarelto, last dose ~5 days ago    Anesthesia Other Findings Chronic pain   Reproductive/Obstetrics                             Anesthesia Physical Anesthesia Plan  ASA: 3  Anesthesia Plan: General   Post-op Pain Management: Celebrex PO (pre-op)* and Precedex   Induction: Intravenous  PONV Risk Score and Plan: 3 and Treatment may vary due to age or medical condition, Ondansetron and Midazolam  Airway Management Planned: Oral ETT  Additional Equipment: None  Intra-op Plan:   Post-operative Plan: Extubation in OR  Informed Consent: I have reviewed the patients History and Physical, chart, labs and discussed the procedure including the risks, benefits and alternatives for the proposed anesthesia with the patient or authorized representative who has indicated his/her understanding and acceptance.     Dental advisory given  Plan Discussed with: CRNA and Anesthesiologist  Anesthesia  Plan Comments:         Anesthesia Quick Evaluation

## 2023-05-27 NOTE — Progress Notes (Signed)
PT Cancellation Note  Patient Details Name: Katie [name] MRN: [id number] DOB: April 18, 1966   Cancelled Treatment:    Reason Eval/Treat Not Completed: (P) Patient at procedure or test/unavailable Pt is off floor for surgery. PT will follow back for evaluation tomorrow.  Katie [name] PT, DPT [facility] Please use secure chat or  Call Office [phone]    Katie [name] [facility] 05/27/2023, 1:43 PM

## 2023-05-27 NOTE — Transfer of Care (Signed)
Immediate Anesthesia Transfer of Care Note  Patient: Katie [name]  Procedure(s) Performed: EXTERNAL FIXATION ANKLE (Bilateral: Ankle)  Patient Location: PACU  Anesthesia Type:General  Level of Consciousness: awake, alert , and oriented  Airway & Oxygen Therapy: Patient Spontanous Breathing and Patient connected to nasal cannula oxygen  Post-op Assessment: Report given to RN and Post -op Vital signs reviewed and stable  Post vital signs: Reviewed and stable  Last Vitals:  Vitals Value Taken Time  BP 152/81 05/27/23 1534  Temp    Pulse 76 05/27/23 1537  Resp 18 05/27/23 1537  SpO2 94 % 05/27/23 1537  Vitals shown include unfiled device data.  Last Pain:  Vitals:   05/27/23 1314  TempSrc: Oral  PainSc:       Patients Stated Pain Goal: 2 (05/27/23 1028)  Complications: No notable events documented.

## 2023-05-27 NOTE — TOC CAGE-AID Note (Signed)
Transition of Care [facility]) - CAGE-AID Screening   Patient Details  Name: Katie [name] MRN: [id number] Date of Birth: Jan 23, 1966  Transition of Care [facility]) CM/SW Contact:    Katha Hamming, RN Phone Number: 05/27/2023, 7:38 PM   Clinical Narrative:  Social alcohol/marijuana use per chart review, no resources indicated  CAGE-AID Screening:    Have You Ever Felt You Ought to Cut Down on Your Drinking or Drug Use?: No Have People Annoyed You By [employer] Your Drinking Or Drug Use?: No Have You Felt Bad Or Guilty About Your Drinking Or Drug Use?: No Have You Ever Had a Drink or Used Drugs First Thing In The Morning to Steady Your Nerves or to Get Rid of a Hangover?: No CAGE-AID Score: 0  Substance Abuse Education Offered: No
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
## 2023-05-28 ENCOUNTER — Inpatient Hospital Stay (HOSPITAL_COMMUNITY): Payer: Commercial Managed Care - HMO

## 2023-05-28 ENCOUNTER — Encounter (HOSPITAL_COMMUNITY): Payer: Self-pay | Admitting: Orthopedic Surgery

## 2023-05-28 LAB — CBC
HCT: 35.4 % — ABNORMAL LOW (ref 36.0–46.0)
Hemoglobin: 11.5 g/dL — ABNORMAL LOW (ref 12.0–15.0)
MCH: 32 pg (ref 26.0–34.0)
MCHC: 32.5 g/dL (ref 30.0–36.0)
MCV: 98.6 fL (ref 80.0–100.0)
Platelets: 256 10*3/uL (ref 150–400)
RBC: 3.59 MIL/uL — ABNORMAL LOW (ref 3.87–5.11)
RDW: 12.3 % (ref 11.5–15.5)
WBC: 10.9 10*3/uL — ABNORMAL HIGH (ref 4.0–10.5)
nRBC: 0 % (ref 0.0–0.2)

## 2023-05-28 LAB — BASIC METABOLIC PANEL
Anion gap: 6 (ref 5–15)
BUN: <5 mg/dL — ABNORMAL LOW (ref 6–20)
CO2: 28 mmol/L (ref 22–32)
Calcium: 8.3 mg/dL — ABNORMAL LOW (ref 8.9–10.3)
Chloride: 102 mmol/L (ref 98–111)
Creatinine, Ser: 0.55 mg/dL (ref 0.44–1.00)
GFR, Estimated: >60 mL/min (ref >60)
Glucose, Bld: 97 mg/dL (ref 70–99)
Potassium: 3.7 mmol/L (ref 3.5–5.1)
Sodium: 136 mmol/L (ref 135–145)

## 2023-05-28 MED ORDER — KETOROLAC TROMETHAMINE 15 MG/ML IJ SOLN: 30.0000 mg | Freq: Four times a day (QID) | INTRAMUSCULAR | Status: DC

## 2023-05-28 MED ORDER — SODIUM CHLORIDE 0.9 % IV SOLN: INTRAVENOUS | Status: DC

## 2023-05-28 MED ORDER — METHOCARBAMOL 750 MG PO TABS
750.0000 mg | ORAL_TABLET | Freq: Four times a day (QID) | ORAL | Status: DC
  Administered 2023-05-28 – 2023-06-02 (×17): 750 mg via ORAL
  Filled 2023-05-28 (×17): qty 1

## 2023-05-28 MED ORDER — OXYCODONE HCL 5 MG PO TABS
5.0000 mg | ORAL_TABLET | ORAL | Status: DC | PRN
  Administered 2023-05-28 – 2023-06-02 (×9): 10 mg via ORAL
  Filled 2023-05-28 (×10): qty 2

## 2023-05-28 MED ORDER — ENOXAPARIN SODIUM 30 MG/0.3ML IJ SOSY
30.0000 mg | PREFILLED_SYRINGE | Freq: Two times a day (BID) | INTRAMUSCULAR | Status: DC
  Administered 2023-05-29 – 2023-05-30 (×2): 30 mg via SUBCUTANEOUS
  Filled 2023-05-28 (×2): qty 0.3

## 2023-05-28 MED ORDER — HYDROMORPHONE HCL 1 MG/ML IJ SOLN
0.5000 mg | INTRAMUSCULAR | Status: DC | PRN
  Administered 2023-05-29 – 2023-06-01 (×10): 1 mg via INTRAVENOUS
  Filled 2023-05-28 (×10): qty 1

## 2023-05-28 MED ORDER — TRAMADOL HCL 50 MG PO TABS: 100.0000 mg | ORAL_TABLET | Freq: Four times a day (QID) | ORAL | Status: DC | PRN

## 2023-05-28 MED ORDER — ACETAMINOPHEN 500 MG PO TABS: 1000.0000 mg | ORAL_TABLET | Freq: Four times a day (QID) | ORAL | Status: DC
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
## 2023-05-28 NOTE — Progress Notes (Signed)
Inpatient Rehab Admissions Coordinator:   Discussed in interdisciplinary rounds. I will f/u after surgery Wednesday.   Estill Dooms, PT, DPT Admissions Coordinator [phone] 05/28/23  3:44 PM
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
## 2023-05-28 NOTE — Progress Notes (Signed)
Inpatient Rehab Admissions Coordinator:  ? ?Per therapy recommendations,  patient was screened for CIR candidacy by Laura Staley, MS, CCC-SLP. At this time, Pt. Appears to be a a potential candidate for CIR. I will place   order for rehab consult per protocol for full assessment. Please contact me any with questions. ? ?Laura Staley, MS, CCC-SLP ?Rehab Admissions Coordinator  ?[phone] (celll) ?[phone] (office) ? ?
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
## 2023-05-28 NOTE — Progress Notes (Signed)
Ortho Trauma Note  We have been consulted by Dr. Dion Saucier for definitive fixation. I have tentatively posted the patient for fixation today, however pending soft tissue swelling and OR availability we may have to postpone until later in the week. Keep patient NPO for now.  Roby Lofts, MD [facility] [phone] (office) [email or web address]
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
## 2023-05-29 ENCOUNTER — Inpatient Hospital Stay (HOSPITAL_COMMUNITY): Payer: Commercial Managed Care - HMO

## 2023-05-29 DIAGNOSIS — R609 Edema, unspecified: Secondary | ICD-10-CM | POA: Diagnosis not present

## 2023-05-29 LAB — CBC
HCT: 33.5 % — ABNORMAL LOW (ref 36.0–46.0)
Hemoglobin: 11.2 g/dL — ABNORMAL LOW (ref 12.0–15.0)
MCH: 31.8 pg (ref 26.0–34.0)
MCHC: 33.4 g/dL (ref 30.0–36.0)
MCV: 95.2 fL (ref 80.0–100.0)
Platelets: 247 10*3/uL (ref 150–400)
RBC: 3.52 MIL/uL — ABNORMAL LOW (ref 3.87–5.11)
RDW: 12.6 % (ref 11.5–15.5)
WBC: 8.4 10*3/uL (ref 4.0–10.5)
nRBC: 0 % (ref 0.0–0.2)

## 2023-05-29 LAB — BASIC METABOLIC PANEL
Anion gap: 9 (ref 5–15)
BUN: 6 mg/dL (ref 6–20)
CO2: 26 mmol/L (ref 22–32)
Calcium: 8.1 mg/dL — ABNORMAL LOW (ref 8.9–10.3)
Chloride: 102 mmol/L (ref 98–111)
Creatinine, Ser: 0.63 mg/dL (ref 0.44–1.00)
GFR, Estimated: >60 mL/min (ref >60)
Glucose, Bld: 95 mg/dL (ref 70–99)
Potassium: 3.6 mmol/L (ref 3.5–5.1)
Sodium: 137 mmol/L (ref 135–145)

## 2023-05-29 NOTE — Progress Notes (Signed)
Ortho tech  was informed about TLSO brace order 07/22. Tech brought TLSO brace to patient this morning.Will continue to monitor

## 2023-05-29 NOTE — Progress Notes (Signed)
Orthopedic Tech Progress Note Patient Details:  Katie [name] 03-Mar-1966 [id number]  Ortho Devices Type of Ortho Device: Thoracolumbar corset (TLSO) Ortho Device/Splint Location: BLE Ortho Device/Splint Interventions: Ordered, Application, Adjustment  I fit brace to patient in bed. Post Interventions Patient Tolerated: Well Instructions Provided: Care of device, Adjustment of device  Trinna Post 05/29/2023, 6:24 AM
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
## 2023-05-29 NOTE — Plan of Care (Signed)
  Problem: Safety: Goal: Ability to remain free from injury will improve Outcome: Progressing   Problem: Skin Integrity: Goal: Risk for impaired skin integrity will decrease Outcome: Not Progressing   Problem: Education: Goal: Knowledge of medication regimen will be met for pain relief regimen by discharge Outcome: Progressing Goal: Understanding of ways to prevent infection will improve by discharge Outcome: Progressing   Problem: Education: Goal: Understanding of ways to prevent infection will improve by discharge Outcome: Progressing   Problem: Coping: Goal: Ability to verbalize feelings will improve by discharge Outcome: Progressing

## 2023-05-29 NOTE — Progress Notes (Signed)
Bilateral lower extremity venous duplex has been completed. Preliminary results can be found in CV Proc through chart review.   05/29/23 1:32 PM Olen Cordial RVT

## 2023-05-29 NOTE — Progress Notes (Addendum)
Subjective: 2 Days Post-Op s/p Procedure(s): EXTERNAL FIXATION ANKLE   Patient is alert, oriented. Pain so far well controlled. Denies chest pain, SOB, Calf pain. No nausea/vomiting. No other complaints.    Objective:  PE: VITALS:   Vitals:   05/28/23 1707 05/28/23 2145 05/29/23 0400 05/29/23 0834  BP: (!) 92/59 118/74 108/78 122/72  Pulse: (!) 55 60 71 62  Resp: 18   16  Temp: 97.8 F (36.6 C) 98.2 F (36.8 C) 98.3 F (36.8 C) 97.6 F (36.4 C)  TempSrc: Oral Oral Oral Oral  SpO2: 100% 100% 99% 99%  Weight:      Height:       Sitting up in bed, in no acute distress MSK: Ex-fix's intact. Able to flex and extend all toes on bilateral feet. Endorses sensation to all aspects of bilateral feet. + DP pulses. Compartments compressible. Left ankle more edematous than right.   LABS  Results for orders placed or performed during the hospital encounter of 05/26/23 (from the past 24 hour(s))  CBC     Status: Abnormal   Collection Time: 05/29/23  3:08 AM  Result Value Ref Range   WBC 8.4 4.0 - 10.5 K/uL   RBC 3.52 (L) 3.87 - 5.11 MIL/uL   Hemoglobin 11.2 (L) 12.0 - 15.0 g/dL   HCT 14.7 (L) 82.9 - 56.2 %   MCV 95.2 80.0 - 100.0 fL   MCH 31.8 26.0 - 34.0 pg   MCHC 33.4 30.0 - 36.0 g/dL   RDW 13.0 86.5 - 78.4 %   Platelets 247 150 - 400 K/uL   nRBC 0.0 0.0 - 0.2 %    CT ANKLE LEFT WO CONTRAST  Result Date: 05/28/2023 CLINICAL DATA:  Ankle trauma, fracture, xray done (Age >= 5y) EXAM: CT OF THE LEFT ANKLE WITHOUT CONTRAST TECHNIQUE: Multidetector CT imaging of the left ankle was performed according to the standard protocol. Multiplanar CT image reconstructions were also generated. RADIATION DOSE REDUCTION: This exam was performed according to the departmental dose-optimization program which includes automated exposure control, adjustment of the mA and/or kV according to patient size and/or use of iterative reconstruction technique. COMPARISON:  X-ray 05/27/2023, 05/26/2023; CT  05/26/2023 FINDINGS: Bones/Joint/Cartilage External fixation device traversing fractures of the distal left tibia and fibula. Markedly comminuted fracture of the distal tibia with improved alignment status post reduction. Approximately 6 mm of articular-surface depression at the central aspect of the tibial plafond. Markedly comminuted fracture of the distal fibula, also demonstrating approved alignment status post reduction. Posterior subluxation of the talus relative to the distal tibia. Tiny cortical fracture of the talar dome near the lateral talar shoulder (series 7, image 50). Nondisplaced fracture of the lateral process of the talus (series 7, image 46). Large tibiotalar joint hemarthrosis with numerous small intra-articular fracture fragments. Nondisplaced fracture through the anterior body of the calcaneus (series 8, image 46). Subtalar and midfoot alignment are maintained. Nondisplaced intra-articular fracture of the second metatarsal base, partially imaged at the edge of the field of view (series 3, image 107). Ligaments Suboptimally assessed by CT. Muscles and Tendons The tibialis posterior and flexor digitorum longus tendons closely approximate the distal tibial fracture without obvious signs of tendon entrapment. Soft tissues Diffuse soft tissue swelling and ill-defined hematoma about the ankle and foot. There are areas of probable skin blistering at the lateral aspect of the lower leg. IMPRESSION: LEFT ANKLE: 1. Markedly comminuted fractures of the distal left tibia and fibula with improved alignment status post reduction  and external fixation. 2. Posterior subluxation of the talus relative to the distal tibia. 3. Tiny cortical fracture of the talar dome near the lateral talar shoulder. 4. Nondisplaced fracture of the lateral process of the talus. 5. Nondisplaced fracture through the anterior body of the calcaneus. 6. Nondisplaced intra-articular fracture of the second metatarsal base, partially  imaged at the edge of the field of view. 7. Diffuse soft tissue swelling and ill-defined hematoma about the ankle and foot. There are areas of probable skin blistering at the lateral aspect of the lower leg. Electronically Signed   By: Duanne Guess D.O.   On: 05/28/2023 10:46   DG Ankle Right Port  Result Date: 05/27/2023 CLINICAL DATA:  Status post external fixator placement EXAM: PORTABLE RIGHT ANKLE - 2 VIEW COMPARISON:  None Available. FINDINGS: Distal tibial and fibular fractures are noted. External fixator is noted in place. Fracture fragments are in near anatomic alignment. IMPRESSION: Status post fixator placement Electronically Signed   By: Alcide Clever M.D.   On: 05/27/2023 18:37   DG Ankle Left Port  Result Date: 05/27/2023 CLINICAL DATA:  Status post fixator placement EXAM: PORTABLE LEFT ANKLE - 2 VIEW COMPARISON:  Films from earlier in the same day. FINDINGS: External fixator is now placed. Distal tibial and fibular fractures are again noted. Some impaction of the talar dome into the distal tibial fracture is noted. IMPRESSION: Stable appearing distal tibial and fibular fractures. External fixator in place. Electronically Signed   By: Alcide Clever M.D.   On: 05/27/2023 18:37   DG Ankle 2 Views Right  Result Date: 05/27/2023 CLINICAL DATA:  External fixator placement EXAM: RIGHT ANKLE - 2 VIEW COMPARISON:  Films from the previous day. FLUOROSCOPY TIME:  Radiation Exposure Index (as provided by the fluoroscopic device): 2.82 mGy If the device does not provide the exposure index: Fluoroscopy Time:  86 seconds Number of Acquired Images:  5 FINDINGS: External fixator is placed about the right ankle with screws extending into the mid tibia as well as into the calcaneus. Fracture fragments are in near anatomic alignment. IMPRESSION: External fixator placement about the right ankle. Electronically Signed   By: Alcide Clever M.D.   On: 05/27/2023 18:36   DG Ankle 2 Views Left  Result Date:  05/27/2023 CLINICAL DATA:  External fixator placement EXAM: LEFT ANKLE - 2 VIEW COMPARISON:  Films from the previous day FLUOROSCOPY TIME:  Radiation Exposure Index (as provided by the fluoroscopic device): 2.82 mGy If the device does not provide the exposure index: Fluoroscopy Time:  86 seconds Number of Acquired Images:  6 FINDINGS: External fixator is placed into the midshaft of the tibia as well as into the calcaneus. Previously seen distal tibial and fibular fractures are again noted and stable. Some impaction of the talar dome into the distal tibial fracture is seen. IMPRESSION: External fixator placement about the left ankle. Electronically Signed   By: Alcide Clever M.D.   On: 05/27/2023 18:35   DG C-Arm 1-60 Min-No Report  Result Date: 05/27/2023 Fluoroscopy was utilized by the requesting physician.  No radiographic interpretation.   DG C-Arm 1-60 Min-No Report  Result Date: 05/27/2023 Fluoroscopy was utilized by the requesting physician.  No radiographic interpretation.   DG C-Arm 1-60 Min-No Report  Result Date: 05/27/2023 Fluoroscopy was utilized by the requesting physician.  No radiographic interpretation.   DG C-Arm 1-60 Min-No Report  Result Date: 05/27/2023 Fluoroscopy was utilized by the requesting physician.  No radiographic interpretation.    Assessment/Plan:  Right ankle fracture dislocation  Left comminuted pilon fracture  2 Days Post-Op s/p Procedure(s): EXTERNAL FIXATION ANKLE  Weightbearing: NWB BLE Insicional and dressing care: Reinforce dressings as needed Pain control: continue current regimen  Please see Dr. Luvenia Starch note from yesterday, hopeful that she can return to OR tomorrow with Dr. Jena Gauss for definitive fixation of one or both ankles, this will be dependent on swelling and OR availability.  Please keep NPO after midnight tonight.   Contact information:   Janine Ores, Cordelia Poche [id number]  After hours and holidays please check [email or web address] for group call  information for [facility]  Armida Sans 05/29/2023, 10:16 AM

## 2023-05-30 ENCOUNTER — Inpatient Hospital Stay (HOSPITAL_COMMUNITY): Payer: Commercial Managed Care - HMO

## 2023-05-30 LAB — BASIC METABOLIC PANEL
Anion gap: 7 (ref 5–15)
BUN: <5 mg/dL — ABNORMAL LOW (ref 6–20)
CO2: 30 mmol/L (ref 22–32)
Calcium: 7.9 mg/dL — ABNORMAL LOW (ref 8.9–10.3)
Chloride: 99 mmol/L (ref 98–111)
Creatinine, Ser: 0.66 mg/dL (ref 0.44–1.00)
GFR, Estimated: >60 mL/min (ref >60)
Glucose, Bld: 104 mg/dL — ABNORMAL HIGH (ref 70–99)
Potassium: 3.8 mmol/L (ref 3.5–5.1)
Sodium: 136 mmol/L (ref 135–145)

## 2023-05-30 LAB — TROPONIN I (HIGH SENSITIVITY): Troponin I (High Sensitivity): 2 ng/L (ref <18)
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
## 2023-05-30 NOTE — Progress Notes (Signed)
Ortho Trauma Note  Unfortunately due to the OR availability and overnight urgent/emergent cases that came and I will be unable to provide definitive care for this [facility] today.  We will tentatively plan for surgery tomorrow with my partner Dr. Carola Frost depending on OR availability.  I have relayed this to her.  Continue with current treatment of nonweightbearing bilateral lower extremities.  Roby Lofts, MD [facility] [phone] (office) [email or web address]

## 2023-05-30 NOTE — Progress Notes (Signed)
Inpatient Rehab Admissions Coordinator:   Continue to follow.  Surgery pushed back due to scheduling.    Estill Dooms, PT, DPT Admissions Coordinator [phone] 05/30/23  11:13 AM
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
## 2023-05-31 ENCOUNTER — Inpatient Hospital Stay (HOSPITAL_COMMUNITY): Payer: Commercial Managed Care - HMO

## 2023-05-31 ENCOUNTER — Inpatient Hospital Stay (HOSPITAL_COMMUNITY): Payer: Commercial Managed Care - HMO | Admitting: Certified Registered"

## 2023-05-31 ENCOUNTER — Encounter (HOSPITAL_COMMUNITY): Payer: Self-pay

## 2023-05-31 ENCOUNTER — Encounter (HOSPITAL_COMMUNITY): Admission: EM | Disposition: A | Discharge status: Rehabilitation Facility | Payer: Self-pay | Source: Home / Self Care

## 2023-05-31 ENCOUNTER — Other Ambulatory Visit: Payer: Self-pay

## 2023-05-31 DIAGNOSIS — S82892A Other fracture of left lower leg, initial encounter for closed fracture: Secondary | ICD-10-CM | POA: Diagnosis not present

## 2023-05-31 DIAGNOSIS — S82891A Other fracture of right lower leg, initial encounter for closed fracture: Secondary | ICD-10-CM

## 2023-05-31 HISTORY — PX: ORIF ANKLE FRACTURE: SHX5408

## 2023-05-31 HISTORY — PX: OPEN REDUCTION INTERNAL FIXATION (ORIF) TIBIA/FIBULA FRACTURE: SHX5992

## 2023-05-31 LAB — CBC
HCT: 37 % (ref 36.0–46.0)
Hemoglobin: 12.2 g/dL (ref 12.0–15.0)
MCH: 32.4 pg (ref 26.0–34.0)
MCHC: 33 g/dL (ref 30.0–36.0)
MCV: 98.4 fL (ref 80.0–100.0)
Platelets: 298 10*3/uL (ref 150–400)
RBC: 3.76 MIL/uL — ABNORMAL LOW (ref 3.87–5.11)
RDW: 12.4 % (ref 11.5–15.5)
WBC: 12.5 10*3/uL — ABNORMAL HIGH (ref 4.0–10.5)
nRBC: 0 % (ref 0.0–0.2)

## 2023-05-31 LAB — CREATININE, SERUM
Creatinine, Ser: 0.57 mg/dL (ref 0.44–1.00)
GFR, Estimated: >60 mL/min (ref >60)

## 2023-05-31 SURGERY — OPEN REDUCTION INTERNAL FIXATION (ORIF) ANKLE FRACTURE
Anesthesia: Regional | Site: Ankle | Laterality: Right

## 2023-05-31 MED ORDER — FENTANYL CITRATE (PF) 250 MCG/5ML IJ SOLN
INTRAMUSCULAR | Status: AC
  Filled 2023-05-31: qty 5

## 2023-05-31 MED ORDER — CHLORHEXIDINE GLUCONATE 0.12 % MT SOLN
OROMUCOSAL | Status: AC
  Filled 2023-05-31: qty 15

## 2023-05-31 MED ORDER — ROCURONIUM BROMIDE 10 MG/ML (PF) SYRINGE
PREFILLED_SYRINGE | INTRAVENOUS | Status: DC | PRN
  Administered 2023-05-31: 20 mg via INTRAVENOUS
  Administered 2023-05-31: 30 mg via INTRAVENOUS
  Administered 2023-05-31: 60 mg via INTRAVENOUS
  Administered 2023-05-31: 40 mg via INTRAVENOUS
  Administered 2023-05-31: 30 mg via INTRAVENOUS

## 2023-05-31 MED ORDER — BUPIVACAINE HCL (PF) 0.5 % IJ SOLN
INTRAMUSCULAR | Status: DC | PRN
  Administered 2023-05-31 (×2): 20 mL via PERINEURAL

## 2023-05-31 MED ORDER — ONDANSETRON HCL 4 MG/2ML IJ SOLN
INTRAMUSCULAR | Status: AC
  Filled 2023-05-31: qty 2

## 2023-05-31 MED ORDER — METHOCARBAMOL 500 MG PO TABS: 500.0000 mg | ORAL_TABLET | Freq: Four times a day (QID) | ORAL | Status: DC | PRN

## 2023-05-31 MED ORDER — PROPOFOL 10 MG/ML IV BOLUS
INTRAVENOUS | Status: DC | PRN
  Administered 2023-05-31: 200 mg via INTRAVENOUS

## 2023-05-31 MED ORDER — CEFAZOLIN SODIUM-DEXTROSE 2-4 GM/100ML-% IV SOLN
INTRAVENOUS | Status: AC
  Filled 2023-05-31: qty 100

## 2023-05-31 MED ORDER — FENTANYL CITRATE (PF) 100 MCG/2ML IJ SOLN: 25.0000 ug | INTRAMUSCULAR | Status: DC | PRN

## 2023-05-31 MED ORDER — ONDANSETRON HCL 4 MG PO TABS: 4.0000 mg | ORAL_TABLET | Freq: Four times a day (QID) | ORAL | Status: DC | PRN

## 2023-05-31 MED ORDER — ONDANSETRON HCL 4 MG/2ML IJ SOLN: 4.0000 mg | Freq: Four times a day (QID) | INTRAMUSCULAR | Status: DC | PRN

## 2023-05-31 MED ORDER — LIDOCAINE 2% (20 MG/ML) 5 ML SYRINGE
INTRAMUSCULAR | Status: DC | PRN
  Administered 2023-05-31: 60 mg via INTRAVENOUS

## 2023-05-31 MED ORDER — ROCURONIUM BROMIDE 10 MG/ML (PF) SYRINGE
PREFILLED_SYRINGE | INTRAVENOUS | Status: AC
  Filled 2023-05-31: qty 20

## 2023-05-31 MED ORDER — 0.9 % SODIUM CHLORIDE (POUR BTL) OPTIME
TOPICAL | Status: DC | PRN
  Administered 2023-05-31: 1000 mL

## 2023-05-31 MED ORDER — MIDAZOLAM HCL 2 MG/2ML IJ SOLN
INTRAMUSCULAR | Status: AC
  Filled 2023-05-31: qty 2

## 2023-05-31 MED ORDER — DEXMEDETOMIDINE HCL IN NACL 80 MCG/20ML IV SOLN
INTRAVENOUS | Status: DC | PRN
  Administered 2023-05-31: 8 ug via INTRAVENOUS
  Administered 2023-05-31: 12 ug via INTRAVENOUS

## 2023-05-31 MED ORDER — ONDANSETRON HCL 4 MG/2ML IJ SOLN
INTRAMUSCULAR | Status: DC | PRN
  Administered 2023-05-31: 4 mg via INTRAVENOUS

## 2023-05-31 MED ORDER — HYDROMORPHONE HCL 1 MG/ML IJ SOLN
INTRAMUSCULAR | Status: AC
  Filled 2023-05-31: qty 0.5

## 2023-05-31 MED ORDER — METHOCARBAMOL 1000 MG/10ML IJ SOLN: 500.0000 mg | Freq: Four times a day (QID) | INTRAVENOUS | Status: DC | PRN

## 2023-05-31 MED ORDER — ORAL CARE MOUTH RINSE: 15.0000 mL | Freq: Once | OROMUCOSAL | Status: DC

## 2023-05-31 MED ORDER — METOCLOPRAMIDE HCL 5 MG PO TABS: 5.0000 mg | ORAL_TABLET | Freq: Three times a day (TID) | ORAL | Status: DC | PRN

## 2023-05-31 MED ORDER — LACTATED RINGERS IV SOLN: INTRAVENOUS | Status: DC

## 2023-05-31 MED ORDER — HYDROMORPHONE HCL 1 MG/ML IJ SOLN
INTRAMUSCULAR | Status: DC | PRN
  Administered 2023-05-31 (×2): .5 mg via INTRAVENOUS

## 2023-05-31 MED ORDER — ENOXAPARIN SODIUM 40 MG/0.4ML IJ SOSY
40.0000 mg | PREFILLED_SYRINGE | INTRAMUSCULAR | Status: DC
  Administered 2023-06-01 – 2023-06-02 (×2): 40 mg via SUBCUTANEOUS
  Filled 2023-05-31 (×2): qty 0.4

## 2023-05-31 MED ORDER — CEFAZOLIN SODIUM-DEXTROSE 2-3 GM-%(50ML) IV SOLR
INTRAVENOUS | Status: DC | PRN
  Administered 2023-05-31: 2 g via INTRAVENOUS

## 2023-05-31 MED ORDER — DOCUSATE SODIUM 100 MG PO CAPS
100.0000 mg | ORAL_CAPSULE | Freq: Two times a day (BID) | ORAL | Status: DC
  Administered 2023-05-31 – 2023-06-02 (×4): 100 mg via ORAL
  Filled 2023-05-31 (×4): qty 1

## 2023-05-31 MED ORDER — FENTANYL CITRATE (PF) 250 MCG/5ML IJ SOLN
INTRAMUSCULAR | Status: DC | PRN
  Administered 2023-05-31 (×2): 100 ug via INTRAVENOUS
  Administered 2023-05-31: 50 ug via INTRAVENOUS

## 2023-05-31 MED ORDER — DEXAMETHASONE SODIUM PHOSPHATE 10 MG/ML IJ SOLN
INTRAMUSCULAR | Status: AC
  Filled 2023-05-31: qty 1

## 2023-05-31 MED ORDER — PANTOPRAZOLE SODIUM 40 MG PO TBEC
40.0000 mg | DELAYED_RELEASE_TABLET | Freq: Every day | ORAL | Status: DC
  Administered 2023-05-31 – 2023-06-02 (×3): 40 mg via ORAL
  Filled 2023-05-31 (×3): qty 1

## 2023-05-31 MED ORDER — METOCLOPRAMIDE HCL 5 MG/ML IJ SOLN: 5.0000 mg | Freq: Three times a day (TID) | INTRAMUSCULAR | Status: DC | PRN

## 2023-05-31 MED ORDER — TRAMADOL HCL 50 MG PO TABS
50.0000 mg | ORAL_TABLET | Freq: Four times a day (QID) | ORAL | Status: DC
  Administered 2023-05-31 – 2023-06-02 (×7): 50 mg via ORAL
  Filled 2023-05-31 (×7): qty 1

## 2023-05-31 MED ORDER — CEFAZOLIN SODIUM-DEXTROSE 1-4 GM/50ML-% IV SOLN
1.0000 g | Freq: Four times a day (QID) | INTRAVENOUS | Status: AC
  Administered 2023-05-31 – 2023-06-01 (×3): 1 g via INTRAVENOUS
  Filled 2023-05-31 (×4): qty 50

## 2023-05-31 MED ORDER — CHLORHEXIDINE GLUCONATE 0.12 % MT SOLN: 15.0000 mL | Freq: Once | OROMUCOSAL | Status: DC

## 2023-05-31 MED ORDER — MAGNESIUM HYDROXIDE 400 MG/5ML PO SUSP: 30.0000 mL | Freq: Every day | ORAL | Status: DC | PRN

## 2023-05-31 MED ORDER — MIDAZOLAM HCL 2 MG/2ML IJ SOLN
INTRAMUSCULAR | Status: DC | PRN
  Administered 2023-05-31: 2 mg via INTRAVENOUS

## 2023-05-31 MED ORDER — PROPOFOL 10 MG/ML IV BOLUS
INTRAVENOUS | Status: AC
  Filled 2023-05-31: qty 20

## 2023-05-31 MED ORDER — MORPHINE SULFATE (PF) 2 MG/ML IV SOLN: 0.5000 mg | INTRAVENOUS | Status: DC | PRN

## 2023-05-31 MED ORDER — DEXAMETHASONE SODIUM PHOSPHATE 10 MG/ML IJ SOLN
INTRAMUSCULAR | Status: DC | PRN
  Administered 2023-05-31: 5 mg via INTRAVENOUS

## 2023-05-31 MED ORDER — SUGAMMADEX SODIUM 200 MG/2ML IV SOLN
INTRAVENOUS | Status: DC | PRN
  Administered 2023-05-31: 200 mg via INTRAVENOUS

## 2023-05-31 SURGICAL SUPPLY — 88 items
BAG COUNTER SPONGE SURGICOUNT (BAG) ×3 IMPLANT
BAG SPNG CNTER NS LX DISP (BAG) ×2
BANDAGE ESMARK 6X9 LF (GAUZE/BANDAGES/DRESSINGS) ×3 IMPLANT
BAR EXFX 150X11 NS LF (EXFIX) ×4
BAR GLASS FIBER EXFX 11X150 (EXFIX) IMPLANT
BIT DRILL SHORT 2.0 ZI (BIT) IMPLANT
BIT DRILL SHORT 2.5 (BIT) IMPLANT
BIT DRL SHORT 2.5 (BIT) ×2
BNDG CMPR 9X6 STRL LF SNTH (GAUZE/BANDAGES/DRESSINGS) ×2
BNDG CMPR MD 5X2 ELC HKLP STRL (GAUZE/BANDAGES/DRESSINGS) ×4
BNDG CMPR MED 10X6 ELC LF (GAUZE/BANDAGES/DRESSINGS) ×4
BNDG ELASTIC 2 VLCR STRL LF (GAUZE/BANDAGES/DRESSINGS) IMPLANT
BNDG ELASTIC 6X10 VLCR STRL LF (GAUZE/BANDAGES/DRESSINGS) IMPLANT
BNDG ESMARK 6X9 LF (GAUZE/BANDAGES/DRESSINGS) ×2
BNDG GAUZE DERMACEA FLUFF 4 (GAUZE/BANDAGES/DRESSINGS) ×6 IMPLANT
BNDG GZE DERMACEA 4 6PLY (GAUZE/BANDAGES/DRESSINGS) ×4
BRUSH SCRUB EZ PLAIN DRY (MISCELLANEOUS) ×6 IMPLANT
CLAMP BLUE BAR TO BAR (EXFIX) IMPLANT
CLAMP BLUE BAR TO PIN (EXFIX) IMPLANT
COVER MAYO STAND STRL (DRAPES) ×3 IMPLANT
COVER SURGICAL LIGHT HANDLE (MISCELLANEOUS) ×3 IMPLANT
CUFF TOURN SGL QUICK 34 (TOURNIQUET CUFF) ×2
CUFF TRNQT CYL 34X4.125X (TOURNIQUET CUFF) ×3 IMPLANT
DRAPE BILATERAL LIMB T (DRAPES) IMPLANT
DRAPE C-ARM 42X72 X-RAY (DRAPES) ×3 IMPLANT
DRAPE C-ARMOR (DRAPES) ×3 IMPLANT
DRAPE HALF SHEET 40X57 (DRAPES) ×3 IMPLANT
DRAPE U-SHAPE 47X51 STRL (DRAPES) ×3 IMPLANT
DRSG MEPILEX POST OP 4X12 (GAUZE/BANDAGES/DRESSINGS) IMPLANT
ELECT REM PT RETURN 9FT ADLT (ELECTROSURGICAL) ×2
ELECTRODE REM PT RTRN 9FT ADLT (ELECTROSURGICAL) ×3 IMPLANT
FIXATION ZIPTIGHT ANKLE SNDSMS (Ankle) IMPLANT
GAUZE PAD ABD 8X10 STRL (GAUZE/BANDAGES/DRESSINGS) IMPLANT
GAUZE SPONGE 4X4 12PLY STRL (GAUZE/BANDAGES/DRESSINGS) ×6 IMPLANT
GLOVE BIO SURGEON STRL SZ7.5 (GLOVE) ×3 IMPLANT
GLOVE BIO SURGEON STRL SZ8 (GLOVE) ×3 IMPLANT
GLOVE BIOGEL PI IND STRL 7.5 (GLOVE) ×3 IMPLANT
GLOVE BIOGEL PI IND STRL 8 (GLOVE) ×3 IMPLANT
GLOVE SURG ORTHO LTX SZ7.5 (GLOVE) ×6 IMPLANT
GOWN STRL REUS W/ TWL LRG LVL3 (GOWN DISPOSABLE) ×6 IMPLANT
GOWN STRL REUS W/ TWL XL LVL3 (GOWN DISPOSABLE) ×3 IMPLANT
GOWN STRL REUS W/TWL LRG LVL3 (GOWN DISPOSABLE) ×4
GOWN STRL REUS W/TWL XL LVL3 (GOWN DISPOSABLE) ×2
HALF PIN 3MM (EXFIX) IMPLANT
K-WIRE ALPS MXV 1.6X6 ZI (WIRE) ×8
K-WIRE OLIVE PLT TACK 2.0-4 SU (WIRE) ×2
K-WIRE OLIVE TACK 2.0-4.0 SU (WIRE) ×2
KIT BASIN OR (CUSTOM PROCEDURE TRAY) ×3 IMPLANT
KIT TURNOVER KIT B (KITS) ×3 IMPLANT
KWIRE ALPS MXV 1.6X6 ZI (WIRE) IMPLANT
KWIRE OLIVE PLT TACK 2.0-4 SU (WIRE) IMPLANT
MANIFOLD NEPTUNE II (INSTRUMENTS) ×3 IMPLANT
NS IRRIG 1000ML POUR BTL (IV SOLUTION) ×3 IMPLANT
PACK GENERAL/GYN (CUSTOM PROCEDURE TRAY) ×3 IMPLANT
PACK ORTHO EXTREMITY (CUSTOM PROCEDURE TRAY) ×3 IMPLANT
PAD ARMBOARD 7.5X6 YLW CONV (MISCELLANEOUS) ×6 IMPLANT
PAD CAST 4YDX4 CTTN HI CHSV (CAST SUPPLIES) IMPLANT
PADDING CAST COTTON 4X4 STRL (CAST SUPPLIES) ×4
PADDING CAST COTTON 6X4 STRL (CAST SUPPLIES) IMPLANT
PIN 3MM (EXFIX) IMPLANT
PLATE LAT FIB 6H RT (Plate) IMPLANT
PLATE MED MALL HOOK 3H (Plate) IMPLANT
PLATE TIB MED 10H LT (Plate) IMPLANT
SCREW LOCK MDS 2.7X18 (Screw) IMPLANT
SCREW LOCK MDS 3.5X12 (Screw) IMPLANT
SCREW LOCK MDS 3.5X14 (Screw) IMPLANT
SCREW LOCK MDS 3.5X34 (Screw) IMPLANT
SCREW LOCK MDS 3.5X40 (Screw) IMPLANT
SCREW NL MDS 3.5X26 (Screw) IMPLANT
SCREW NL3.5X38 (Screw) IMPLANT
SCREW NLOCK 2.7X18 (Screw) IMPLANT
SCREW NLOCK 3.5X20 (Screw) IMPLANT
SCREW NLOCK 3.5X24 (Screw) IMPLANT
SCREW NLOCK 3.5X30 (Screw) IMPLANT
SCREW NLOCK 3.5X34 (Screw) IMPLANT
SCREW NLOCK ALPS 3.5X14 (Screw) IMPLANT
SPONGE T-LAP 18X18 ~~LOC~~+RFID (SPONGE) ×3 IMPLANT
SUCTION TUBE FRAZIER 10FR DISP (SUCTIONS) ×3 IMPLANT
SUT ETHILON 2 0 FS 18 (SUTURE) ×6 IMPLANT
SUT ETHILON 2 0 PSLX (SUTURE) IMPLANT
SUT VIC AB 2-0 CT1 27 (SUTURE) ×4
SUT VIC AB 2-0 CT1 TAPERPNT 27 (SUTURE) ×6 IMPLANT
TOWEL GREEN STERILE (TOWEL DISPOSABLE) ×6 IMPLANT
TOWEL GREEN STERILE FF (TOWEL DISPOSABLE) ×3 IMPLANT
TUBE CONNECTING 12X1/4 (SUCTIONS) ×3 IMPLANT
UNDERPAD 30X36 HEAVY ABSORB (UNDERPADS AND DIAPERS) ×3 IMPLANT
WATER STERILE IRR 1000ML POUR (IV SOLUTION) ×3 IMPLANT
ZIPTIGHT ANKLE SYNODESMOSS FIX (Ankle) ×2 IMPLANT

## 2023-05-31 NOTE — Anesthesia Procedure Notes (Signed)
Anesthesia Regional Block: Popliteal block   Pre-Anesthetic Checklist: , timeout performed,  Correct Patient, Correct Site, Correct Laterality,  Correct Procedure, Correct Position, site marked,  Risks and benefits discussed,  Surgical consent,  Pre-op evaluation,  At surgeon's request and post-op pain management  Laterality: Right  Prep: chloraprep       Needles:  Injection technique: Single-shot  Needle Type: Echogenic Stimulator Needle          Additional Needles:   Procedures:, nerve stimulator,,,,,     Nerve Stimulator or Paresthesia:  Response: plantar flexion of foot, 0.45 mA  Additional Responses:   Narrative:  Start time: 05/31/2023 7:27 AM End time: 05/31/2023 7:33 AM Injection made incrementally with aspirations every 5 mL.  Performed by: Personally  Anesthesiologist: Achille Rich, MD  Additional Notes: Functioning IV was confirmed and monitors were applied.  A 90mm 21ga Arrow echogenic stimulator needle was used. Sterile prep and drape,hand hygiene and sterile gloves were used.  Negative aspiration and negative test dose prior to incremental administration of local anesthetic. The patient tolerated the procedure well.  Ultrasound guidance: relevent anatomy identified, needle position confirmed, local anesthetic spread visualized around nerve(s), vascular puncture avoided.  Image printed for medical record.

## 2023-05-31 NOTE — Progress Notes (Signed)
OT Cancellation Note  Patient Details Name: ABBEGALE STEHLE MRN: [id number] DOB: 04-23-1966   Cancelled Treatment:    Reason Eval/Treat Not Completed: Patient at procedure or test/ unavailable.  Patient currently in surgery.    Britania Shreeve D Sibyl Mikula 05/31/2023, 8:06 AM 05/31/2023  RP, OTR/L  Acute Rehabilitation Services  Office:  [phone]
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
## 2023-05-31 NOTE — Anesthesia Procedure Notes (Signed)
Procedure Name: Intubation Date/Time: 05/31/2023 8:43 AM  Performed by: Gus Puma, CRNAPre-anesthesia Checklist: Patient identified, Emergency Drugs available, Suction available and Patient being monitored Patient Re-evaluated:Patient Re-evaluated prior to induction Oxygen Delivery Method: Circle System Utilized Preoxygenation: Pre-oxygenation with 100% oxygen Induction Type: IV induction Ventilation: Mask ventilation without difficulty Laryngoscope Size: Mac and 3 Grade View: Grade II Tube type: Oral Tube size: 7.0 mm Number of attempts: 1 Airway Equipment and Method: Stylet and Oral airway Placement Confirmation: ETT inserted through vocal cords under direct vision, positive ETCO2 and breath sounds checked- equal and bilateral Secured at: 22 cm Tube secured with: Tape Dental Injury: Teeth and Oropharynx as per pre-operative assessment

## 2023-05-31 NOTE — Progress Notes (Signed)
No changes overnight  The risks and benefits of surgery were discussed with the patient, including the possibility of infection, nerve injury, vessel injury, wound breakdown, arthritis, symptomatic hardware, DVT/ PE, loss of motion, malunion, nonunion, and need for further surgery among others.  We also specifically discussed the need to stage surgery because of the elevated risk of soft tissue breakdown that could lead to amputation.  These risks were acknowledged and consent provided to proceed.  Myrene Galas, MD [facility], [facility] [phone]

## 2023-05-31 NOTE — Anesthesia Procedure Notes (Signed)
Anesthesia Regional Block: Popliteal block   Pre-Anesthetic Checklist: , timeout performed,  Correct Patient, Correct Site, Correct Laterality,  Correct Procedure, Correct Position, site marked,  Risks and benefits discussed,  Surgical consent,  Pre-op evaluation,  At surgeon's request and post-op pain management  Laterality: Left  Prep: chloraprep       Needles:  Injection technique: Single-shot  Needle Type: Echogenic Stimulator Needle          Additional Needles:   Procedures:, nerve stimulator,,,,,     Nerve Stimulator or Paresthesia:  Response: plantar flexion of foot, 0.45 mA  Additional Responses:   Narrative:  Start time: 05/31/2023 7:20 AM End time: 05/31/2023 7:26 AM Injection made incrementally with aspirations every 5 mL.  Performed by: Personally  Anesthesiologist: Achille Rich, MD  Additional Notes: Functioning IV was confirmed and monitors were applied.  A 90mm 21ga Arrow echogenic stimulator needle was used. Sterile prep and drape,hand hygiene and sterile gloves were used.  Negative aspiration and negative test dose prior to incremental administration of local anesthetic. The patient tolerated the procedure well.  Ultrasound guidance: relevent anatomy identified, needle position confirmed, local anesthetic spread visualized around nerve(s), vascular puncture avoided.  Image printed for medical record.
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
## 2023-05-31 NOTE — Transfer of Care (Signed)
Immediate Anesthesia Transfer of Care Note  Patient: Katie [name]  Procedure(s) Performed: OPEN REDUCTION INTERNAL FIXATION (ORIF) ANKLE FRACTURE (Right: Ankle) OPEN REDUCTION INTERNAL FIXATION (ORIF) PILON FRACTURE (Left)  Patient Location: PACU  Anesthesia Type:General and Regional  Level of Consciousness: awake, drowsy, and patient cooperative  Airway & Oxygen Therapy: Patient Spontanous Breathing  Post-op Assessment: Report given to RN and Post -op Vital signs reviewed and stable  Post vital signs: Reviewed and stable  Last Vitals:  Vitals Value Taken Time  BP 139/87 05/31/23 1337  Temp    Pulse 82 05/31/23 1343  Resp 21 05/31/23 1343  SpO2 95 % 05/31/23 1343  Vitals shown include unfiled device data.  Last Pain:  Vitals:   05/31/23 1335  TempSrc:   PainSc: Asleep      Patients Stated Pain Goal: 0 (05/29/23 0425)  Complications: No notable events documented.
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
## 2023-06-01 NOTE — Progress Notes (Signed)
Inpatient Rehab Admissions Coordinator:   I have a bed for this pt to admit to CIR on Saturday (7/27).  Barnetta Chapel, PA-C in agreement.  Rehab MD (Dr. Natale Lay) to assess pt and confirm admission on Saturday.  Floor RN can call CIR at [phone] for report after 12pm on Saturday.  I have let pt/family and case manager know.    Estill Dooms, PT, DPT Admissions Coordinator [phone] 06/01/23  4:01 PM
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
## 2023-06-01 NOTE — Progress Notes (Signed)
Inpatient Rehab Coordinator Note:  I met with patient and her spouse at bedside to discuss CIR recommendations and goals/expectations of CIR stay.  We reviewed 3 hrs/day of therapy, physician follow up, and average length of stay 2 weeks (dependent upon progress) with goals of modified independent, w/c level.  We discussed pt's current level of function and caregiver support.  Spouse is home but has had a stroke so cannot provide much in the way of assist, pt would need to be modified independent or close to it from a w/c level.  We discussed need for prior auth from [facility] and I will start that request today.  Will follow.   Estill Dooms, PT, DPT Admissions Coordinator [phone] 06/01/23  12:37 PM

## 2023-06-01 NOTE — Progress Notes (Signed)
Plan update:  Return to the OR on the left side for a posterior approach to the left tibia and fibula will be required in 12 to 14 days.  Repair has been completed on the right ankle.   Anticipate patient will need to go home or to facility in interim.  Myrene Galas, MD [facility], [facility] [phone]
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
## 2023-06-01 NOTE — Anesthesia Postprocedure Evaluation (Signed)
Anesthesia Post Note  Patient: Katie [name]  Procedure(s) Performed: OPEN REDUCTION INTERNAL FIXATION (ORIF) ANKLE FRACTURE (Right: Ankle) OPEN REDUCTION INTERNAL FIXATION (ORIF) PILON FRACTURE (Left)     Patient location during evaluation: PACU Anesthesia Type: Regional and General Level of consciousness: awake and alert Pain management: pain level controlled Vital Signs Assessment: post-procedure vital signs reviewed and stable Respiratory status: spontaneous breathing, nonlabored ventilation, respiratory function stable and patient connected to nasal cannula oxygen Cardiovascular status: blood pressure returned to baseline and stable Postop Assessment: no apparent nausea or vomiting Anesthetic complications: no   No notable events documented.  Last Vitals:  Vitals:   06/01/23 0510 06/01/23 0749  BP: (!) 93/54 (!) 94/56  Pulse: 73 73  Resp: 16 16  Temp: 37 C 37.1 C  SpO2: 93% 100%    Last Pain:  Vitals:   06/01/23 0749  TempSrc: Oral  PainSc:                  Jarquavious Fentress S

## 2023-06-01 NOTE — Progress Notes (Signed)
#  [id number] 
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
## 2023-06-02 ENCOUNTER — Inpatient Hospital Stay (HOSPITAL_COMMUNITY)
Admission: RE | Admit: 2023-06-02 | Discharge: 2023-06-15 | DRG: 493 | Disposition: A | Discharge status: Home / Self Care | Payer: Commercial Managed Care - HMO | Source: Intra-hospital | Attending: Physical Medicine and Rehabilitation | Admitting: Physical Medicine and Rehabilitation

## 2023-06-02 ENCOUNTER — Other Ambulatory Visit: Payer: Self-pay

## 2023-06-02 ENCOUNTER — Encounter (HOSPITAL_COMMUNITY): Payer: Self-pay | Admitting: Physical Medicine and Rehabilitation

## 2023-06-02 DIAGNOSIS — S82892D Other fracture of left lower leg, subsequent encounter for closed fracture with routine healing: Secondary | ICD-10-CM | POA: Diagnosis not present

## 2023-06-02 DIAGNOSIS — Z86711 Personal history of pulmonary embolism: Secondary | ICD-10-CM | POA: Diagnosis not present

## 2023-06-02 DIAGNOSIS — D72829 Elevated white blood cell count, unspecified: Secondary | ICD-10-CM | POA: Diagnosis not present

## 2023-06-02 DIAGNOSIS — Z7409 Other reduced mobility: Secondary | ICD-10-CM | POA: Diagnosis not present

## 2023-06-02 DIAGNOSIS — L97804 Non-pressure chronic ulcer of other part of unspecified lower leg with necrosis of bone: Secondary | ICD-10-CM | POA: Diagnosis not present

## 2023-06-02 DIAGNOSIS — F419 Anxiety disorder, unspecified: Secondary | ICD-10-CM | POA: Diagnosis present

## 2023-06-02 DIAGNOSIS — G8929 Other chronic pain: Secondary | ICD-10-CM | POA: Diagnosis present

## 2023-06-02 DIAGNOSIS — S82872D Displaced pilon fracture of left tibia, subsequent encounter for closed fracture with routine healing: Secondary | ICD-10-CM | POA: Diagnosis not present

## 2023-06-02 DIAGNOSIS — M4854XD Collapsed vertebra, not elsewhere classified, thoracic region, subsequent encounter for fracture with routine healing: Secondary | ICD-10-CM | POA: Diagnosis present

## 2023-06-02 DIAGNOSIS — I959 Hypotension, unspecified: Secondary | ICD-10-CM | POA: Diagnosis not present

## 2023-06-02 DIAGNOSIS — S82899A Other fracture of unspecified lower leg, initial encounter for closed fracture: Secondary | ICD-10-CM | POA: Diagnosis present

## 2023-06-02 DIAGNOSIS — T07XXXA Unspecified multiple injuries, initial encounter: Secondary | ICD-10-CM

## 2023-06-02 DIAGNOSIS — L97404 Non-pressure chronic ulcer of unspecified heel and midfoot with necrosis of bone: Secondary | ICD-10-CM | POA: Diagnosis not present

## 2023-06-02 DIAGNOSIS — I10 Essential (primary) hypertension: Secondary | ICD-10-CM | POA: Diagnosis not present

## 2023-06-02 DIAGNOSIS — S22080A Wedge compression fracture of T11-T12 vertebra, initial encounter for closed fracture: Secondary | ICD-10-CM

## 2023-06-02 DIAGNOSIS — Z7901 Long term (current) use of anticoagulants: Secondary | ICD-10-CM

## 2023-06-02 DIAGNOSIS — E8889 Other specified metabolic disorders: Secondary | ICD-10-CM | POA: Diagnosis present

## 2023-06-02 DIAGNOSIS — D75838 Other thrombocytosis: Secondary | ICD-10-CM | POA: Diagnosis present

## 2023-06-02 DIAGNOSIS — S82841D Displaced bimalleolar fracture of right lower leg, subsequent encounter for closed fracture with routine healing: Secondary | ICD-10-CM

## 2023-06-02 DIAGNOSIS — Z96652 Presence of left artificial knee joint: Secondary | ICD-10-CM | POA: Diagnosis present

## 2023-06-02 DIAGNOSIS — S82899S Other fracture of unspecified lower leg, sequela: Secondary | ICD-10-CM | POA: Diagnosis not present

## 2023-06-02 DIAGNOSIS — L97929 Non-pressure chronic ulcer of unspecified part of left lower leg with unspecified severity: Secondary | ICD-10-CM | POA: Diagnosis present

## 2023-06-02 DIAGNOSIS — K5901 Slow transit constipation: Secondary | ICD-10-CM | POA: Diagnosis not present

## 2023-06-02 DIAGNOSIS — K5900 Constipation, unspecified: Secondary | ICD-10-CM | POA: Diagnosis not present

## 2023-06-02 DIAGNOSIS — D62 Acute posthemorrhagic anemia: Secondary | ICD-10-CM | POA: Diagnosis not present

## 2023-06-02 DIAGNOSIS — S22080D Wedge compression fracture of T11-T12 vertebra, subsequent encounter for fracture with routine healing: Secondary | ICD-10-CM

## 2023-06-02 DIAGNOSIS — Z8249 Family history of ischemic heart disease and other diseases of the circulatory system: Secondary | ICD-10-CM | POA: Diagnosis not present

## 2023-06-02 DIAGNOSIS — Y92410 Unspecified street and highway as the place of occurrence of the external cause: Secondary | ICD-10-CM

## 2023-06-02 DIAGNOSIS — I96 Gangrene, not elsewhere classified: Secondary | ICD-10-CM | POA: Diagnosis present

## 2023-06-02 DIAGNOSIS — Z86718 Personal history of other venous thrombosis and embolism: Secondary | ICD-10-CM | POA: Diagnosis not present

## 2023-06-02 DIAGNOSIS — Z885 Allergy status to narcotic agent status: Secondary | ICD-10-CM

## 2023-06-02 DIAGNOSIS — S82891D Other fracture of right lower leg, subsequent encounter for closed fracture with routine healing: Secondary | ICD-10-CM | POA: Diagnosis not present

## 2023-06-02 DIAGNOSIS — R52 Pain, unspecified: Secondary | ICD-10-CM | POA: Diagnosis not present

## 2023-06-02 DIAGNOSIS — E871 Hypo-osmolality and hyponatremia: Secondary | ICD-10-CM | POA: Diagnosis not present

## 2023-06-02 DIAGNOSIS — S82872A Displaced pilon fracture of left tibia, initial encounter for closed fracture: Secondary | ICD-10-CM | POA: Diagnosis not present

## 2023-06-02 DIAGNOSIS — F54 Psychological and behavioral factors associated with disorders or diseases classified elsewhere: Secondary | ICD-10-CM | POA: Diagnosis not present

## 2023-06-02 DIAGNOSIS — F411 Generalized anxiety disorder: Secondary | ICD-10-CM | POA: Diagnosis not present

## 2023-06-02 DIAGNOSIS — M545 Low back pain, unspecified: Secondary | ICD-10-CM | POA: Diagnosis present

## 2023-06-02 DIAGNOSIS — S82452A Displaced comminuted fracture of shaft of left fibula, initial encounter for closed fracture: Secondary | ICD-10-CM | POA: Diagnosis not present

## 2023-06-02 DIAGNOSIS — Z886 Allergy status to analgesic agent status: Secondary | ICD-10-CM

## 2023-06-02 DIAGNOSIS — R0789 Other chest pain: Secondary | ICD-10-CM | POA: Diagnosis present

## 2023-06-02 DIAGNOSIS — L97429 Non-pressure chronic ulcer of left heel and midfoot with unspecified severity: Secondary | ICD-10-CM | POA: Diagnosis present

## 2023-06-02 DIAGNOSIS — Z6841 Body Mass Index (BMI) 40.0 and over, adult: Secondary | ICD-10-CM

## 2023-06-02 DIAGNOSIS — Z79899 Other long term (current) drug therapy: Secondary | ICD-10-CM | POA: Diagnosis not present

## 2023-06-02 DIAGNOSIS — K5903 Drug induced constipation: Secondary | ICD-10-CM | POA: Diagnosis not present

## 2023-06-02 DIAGNOSIS — S9305XA Dislocation of left ankle joint, initial encounter: Secondary | ICD-10-CM | POA: Diagnosis not present

## 2023-06-02 DIAGNOSIS — F418 Other specified anxiety disorders: Secondary | ICD-10-CM | POA: Diagnosis not present

## 2023-06-02 DIAGNOSIS — M79605 Pain in left leg: Secondary | ICD-10-CM | POA: Diagnosis present

## 2023-06-02 DIAGNOSIS — S82851A Displaced trimalleolar fracture of right lower leg, initial encounter for closed fracture: Secondary | ICD-10-CM | POA: Diagnosis not present

## 2023-06-02 DIAGNOSIS — S93431A Sprain of tibiofibular ligament of right ankle, initial encounter: Secondary | ICD-10-CM | POA: Diagnosis not present

## 2023-06-02 DIAGNOSIS — F319 Bipolar disorder, unspecified: Secondary | ICD-10-CM | POA: Diagnosis present

## 2023-06-02 DIAGNOSIS — S82202A Unspecified fracture of shaft of left tibia, initial encounter for closed fracture: Secondary | ICD-10-CM | POA: Diagnosis not present

## 2023-06-02 DIAGNOSIS — Z91041 Radiographic dye allergy status: Secondary | ICD-10-CM

## 2023-06-02 DIAGNOSIS — Z7982 Long term (current) use of aspirin: Secondary | ICD-10-CM

## 2023-06-02 DIAGNOSIS — Z9049 Acquired absence of other specified parts of digestive tract: Secondary | ICD-10-CM | POA: Diagnosis not present

## 2023-06-02 DIAGNOSIS — I2699 Other pulmonary embolism without acute cor pulmonale: Secondary | ICD-10-CM

## 2023-06-02 DIAGNOSIS — S82899D Other fracture of unspecified lower leg, subsequent encounter for closed fracture with routine healing: Secondary | ICD-10-CM | POA: Diagnosis not present

## 2023-06-02 DIAGNOSIS — G8918 Other acute postprocedural pain: Secondary | ICD-10-CM | POA: Diagnosis not present

## 2023-06-02 DIAGNOSIS — E559 Vitamin D deficiency, unspecified: Secondary | ICD-10-CM | POA: Diagnosis present

## 2023-06-02 MED ORDER — FLEET ENEMA 7-19 GM/118ML RE ENEM: 1.0000 | ENEMA | Freq: Once | RECTAL | Status: DC | PRN

## 2023-06-02 MED ORDER — POLYETHYLENE GLYCOL 3350 17 G PO PACK
34.0000 g | PACK | Freq: Once | ORAL | Status: AC
  Administered 2023-06-02: 34 g via ORAL
  Filled 2023-06-02: qty 2

## 2023-06-02 MED ORDER — MELATONIN 5 MG PO TABS
5.0000 mg | ORAL_TABLET | Freq: Every evening | ORAL | Status: DC | PRN
  Administered 2023-06-05 – 2023-06-08 (×2): 5 mg via ORAL
  Filled 2023-06-02 (×2): qty 1

## 2023-06-02 MED ORDER — OXYCODONE HCL 5 MG PO TABS
5.0000 mg | ORAL_TABLET | ORAL | Status: DC | PRN
  Administered 2023-06-02 – 2023-06-08 (×11): 10 mg via ORAL
  Administered 2023-06-08 – 2023-06-09 (×2): 5 mg via ORAL
  Administered 2023-06-09 – 2023-06-12 (×5): 10 mg via ORAL
  Administered 2023-06-13 (×2): 5 mg via ORAL
  Administered 2023-06-14 – 2023-06-15 (×2): 10 mg via ORAL
  Filled 2023-06-02 (×3): qty 2
  Filled 2023-06-02: qty 1
  Filled 2023-06-02 (×4): qty 2
  Filled 2023-06-02: qty 1
  Filled 2023-06-02 (×3): qty 2
  Filled 2023-06-02: qty 1
  Filled 2023-06-02 (×7): qty 2
  Filled 2023-06-02: qty 1
  Filled 2023-06-02: qty 2
  Filled 2023-06-02 (×2): qty 1
  Filled 2023-06-02: qty 2

## 2023-06-02 MED ORDER — ALUM & MAG HYDROXIDE-SIMETH 200-200-20 MG/5ML PO SUSP
30.0000 mL | ORAL | Status: DC | PRN
  Administered 2023-06-13: 30 mL via ORAL
  Filled 2023-06-02: qty 30

## 2023-06-02 MED ORDER — POLYETHYLENE GLYCOL 3350 17 G PO PACK
17.0000 g | PACK | Freq: Two times a day (BID) | ORAL | Status: DC
  Administered 2023-06-03 – 2023-06-15 (×21): 17 g via ORAL
  Filled 2023-06-02 (×23): qty 1

## 2023-06-02 MED ORDER — MECLIZINE HCL 25 MG PO TABS: 25.0000 mg | ORAL_TABLET | Freq: Four times a day (QID) | ORAL | Status: DC | PRN

## 2023-06-02 MED ORDER — POTASSIUM CHLORIDE CRYS ER 20 MEQ PO TBCR
20.0000 meq | EXTENDED_RELEASE_TABLET | Freq: Two times a day (BID) | ORAL | Status: DC
  Administered 2023-06-02 – 2023-06-15 (×25): 20 meq via ORAL
  Filled 2023-06-02 (×25): qty 1

## 2023-06-02 MED ORDER — METHOCARBAMOL 750 MG PO TABS
750.0000 mg | ORAL_TABLET | Freq: Four times a day (QID) | ORAL | Status: DC
  Administered 2023-06-02 – 2023-06-15 (×48): 750 mg via ORAL
  Filled 2023-06-02 (×49): qty 1

## 2023-06-02 MED ORDER — VITAMIN D (ERGOCALCIFEROL) 1.25 MG (50000 UNIT) PO CAPS
50000.0000 [IU] | ORAL_CAPSULE | ORAL | Status: DC
  Administered 2023-06-02: 50000 [IU] via ORAL
  Filled 2023-06-02 (×2): qty 1

## 2023-06-02 MED ORDER — PNEUMOCOCCAL VAC POLYVALENT 25 MCG/0.5ML IJ INJ: 0.5000 mL | INJECTION | INTRAMUSCULAR | Status: DC

## 2023-06-02 MED ORDER — BISACODYL 10 MG RE SUPP: 10.0000 mg | Freq: Every day | RECTAL | Status: DC | PRN

## 2023-06-02 MED ORDER — DIAZEPAM 5 MG PO TABS
10.0000 mg | ORAL_TABLET | Freq: Two times a day (BID) | ORAL | Status: DC | PRN
  Administered 2023-06-04 – 2023-06-07 (×5): 10 mg via ORAL
  Filled 2023-06-02 (×5): qty 2

## 2023-06-02 MED ORDER — ENOXAPARIN SODIUM 30 MG/0.3ML IJ SOSY
30.0000 mg | PREFILLED_SYRINGE | Freq: Two times a day (BID) | INTRAMUSCULAR | Status: DC
  Administered 2023-06-03 – 2023-06-12 (×19): 30 mg via SUBCUTANEOUS
  Filled 2023-06-02 (×20): qty 0.3

## 2023-06-02 MED ORDER — GUAIFENESIN-DM 100-10 MG/5ML PO SYRP: 5.0000 mL | ORAL_SOLUTION | Freq: Four times a day (QID) | ORAL | Status: DC | PRN

## 2023-06-02 MED ORDER — TRAMADOL HCL 50 MG PO TABS
50.0000 mg | ORAL_TABLET | Freq: Four times a day (QID) | ORAL | Status: DC
  Administered 2023-06-02 – 2023-06-15 (×50): 50 mg via ORAL
  Filled 2023-06-02 (×52): qty 1

## 2023-06-02 MED ORDER — SORBITOL 70 % SOLN: 45.0000 mL | Freq: Once | Status: DC

## 2023-06-02 MED ORDER — DIPHENHYDRAMINE HCL 25 MG PO CAPS: 25.0000 mg | ORAL_CAPSULE | Freq: Four times a day (QID) | ORAL | Status: DC | PRN

## 2023-06-02 MED ORDER — FUROSEMIDE 40 MG PO TABS
40.0000 mg | ORAL_TABLET | Freq: Every day | ORAL | Status: DC
  Administered 2023-06-03 – 2023-06-15 (×11): 40 mg via ORAL
  Filled 2023-06-02 (×12): qty 1

## 2023-06-02 MED ORDER — SORBITOL 70 % SOLN: 30.0000 mL | Freq: Every day | Status: DC | PRN

## 2023-06-02 MED ORDER — ASPIRIN 81 MG PO CHEW
81.0000 mg | CHEWABLE_TABLET | Freq: Every day | ORAL | Status: DC
  Administered 2023-06-03 – 2023-06-15 (×12): 81 mg via ORAL
  Filled 2023-06-02 (×12): qty 1

## 2023-06-02 MED ORDER — LORATADINE 10 MG PO TABS
10.0000 mg | ORAL_TABLET | Freq: Every day | ORAL | Status: DC
  Administered 2023-06-03 – 2023-06-15 (×12): 10 mg via ORAL
  Filled 2023-06-02 (×12): qty 1

## 2023-06-02 MED ORDER — FLUTICASONE PROPIONATE 50 MCG/ACT NA SUSP
2.0000 | Freq: Every day | NASAL | Status: DC
  Administered 2023-06-03 – 2023-06-15 (×11): 2 via NASAL

## 2023-06-02 MED ORDER — PANTOPRAZOLE SODIUM 40 MG PO TBEC
40.0000 mg | DELAYED_RELEASE_TABLET | Freq: Every day | ORAL | Status: DC
  Administered 2023-06-03 – 2023-06-15 (×12): 40 mg via ORAL
  Filled 2023-06-02 (×12): qty 1

## 2023-06-02 NOTE — Plan of Care (Signed)
  Problem: Education: Goal: Knowledge of General Education information will improve Description: Including pain rating scale, medication(s)/side effects and non-pharmacologic comfort measures Outcome: Progressing   Problem: Health Behavior/Discharge Planning: Goal: Ability to manage health-related needs will improve Outcome: Progressing   Problem: Clinical Measurements: Goal: Ability to maintain clinical measurements within normal limits will improve Outcome: Progressing Goal: Will remain free from infection Outcome: Progressing Goal: Diagnostic test results will improve Outcome: Progressing Goal: Respiratory complications will improve Outcome: Progressing Goal: Cardiovascular complication will be avoided Outcome: Progressing   Problem: Activity: Goal: Risk for activity intolerance will decrease Outcome: Progressing   Problem: Nutrition: Goal: Adequate nutrition will be maintained Outcome: Progressing   Problem: Coping: Goal: Level of anxiety will decrease Outcome: Progressing   Problem: Elimination: Goal: Will not experience complications related to bowel motility Outcome: Progressing Goal: Will not experience complications related to urinary retention Outcome: Progressing   Problem: Pain Managment: Goal: General experience of comfort will improve Outcome: Progressing   Problem: Safety: Goal: Ability to remain free from injury will improve Outcome: Progressing   Problem: Skin Integrity: Goal: Risk for impaired skin integrity will decrease Outcome: Progressing   Problem: Education: Goal: Knowledge of medication regimen will be met for pain relief regimen by discharge Outcome: Progressing Goal: Understanding of ways to prevent infection will improve by discharge Outcome: Progressing   Problem: Coping: Goal: Ability to verbalize feelings will improve by discharge Outcome: Progressing
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
## 2023-06-02 NOTE — Progress Notes (Signed)
Inpatient Rehabilitation Admission Medication Review by a Pharmacist  A complete drug regimen review was completed for this patient to identify any potential clinically significant medication issues.  High Risk Drug Classes Is patient taking? Indication by Medication  Antipsychotic No   Anticoagulant Yes Enoxaparin - VTE ppx  Antibiotic No   Opioid Yes Oxycodone - acute pain Tramadol - acute pain  Antiplatelet Yes ASA 81 - VTE ppx s/p ortho  Hypoglycemics/insulin No   Vasoactive Medication Yes Lasix - HTN  Chemotherapy No   Other Yes Benadryl - itching Melatonin - sleep Diazepam - anxiety Methocarbamol - chronic pain Pantoprazole - GERD     Type of Medication Issue Identified Description of Issue Recommendation(s)  Drug Interaction(s) (clinically significant)     Duplicate Therapy     Allergy     No Medication Administration End Date     Incorrect Dose     Additional Drug Therapy Needed  Xarelto Pt on PTA for remote history of DVT/PE in 2014. LF for 90ds in March 2024. Address whether will continue to take or not prior to CIR.  Significant med changes from prior encounter (inform family/care partners about these prior to discharge).    Other  PTA meds: Omeprazole Phentermine Potassium Restart PTA meds when and if necessary during CIR admission or at time of discharge, if warranted     Clinically significant medication issues were identified that warrant physician communication and completion of prescribed/recommended actions by midnight of the next day:  No  Time spent performing this drug regimen review (minutes):  30  Nicole Kindred, PharmD PGY1 Pharmacy Resident 06/03/2023 7:14 AM
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
## 2023-06-03 DIAGNOSIS — D62 Acute posthemorrhagic anemia: Secondary | ICD-10-CM | POA: Diagnosis not present

## 2023-06-03 DIAGNOSIS — D72829 Elevated white blood cell count, unspecified: Secondary | ICD-10-CM

## 2023-06-03 DIAGNOSIS — K5903 Drug induced constipation: Secondary | ICD-10-CM | POA: Diagnosis not present

## 2023-06-03 DIAGNOSIS — I10 Essential (primary) hypertension: Secondary | ICD-10-CM | POA: Diagnosis not present

## 2023-06-03 DIAGNOSIS — S82899D Other fracture of unspecified lower leg, subsequent encounter for closed fracture with routine healing: Secondary | ICD-10-CM | POA: Diagnosis not present

## 2023-06-03 LAB — COMPREHENSIVE METABOLIC PANEL WITH GFR
ALT: 15 U/L (ref 0–44)
AST: 14 U/L — ABNORMAL LOW (ref 15–41)
Albumin: 2.3 g/dL — ABNORMAL LOW (ref 3.5–5.0)
Alkaline Phosphatase: 89 U/L (ref 38–126)
Anion gap: 10 (ref 5–15)
BUN: 5 mg/dL — ABNORMAL LOW (ref 6–20)
CO2: 30 mmol/L (ref 22–32)
Calcium: 8.6 mg/dL — ABNORMAL LOW (ref 8.9–10.3)
Chloride: 94 mmol/L — ABNORMAL LOW (ref 98–111)
Creatinine, Ser: 0.6 mg/dL (ref 0.44–1.00)
GFR, Estimated: >60 mL/min (ref >60)
Glucose, Bld: 89 mg/dL (ref 70–99)
Potassium: 3.9 mmol/L (ref 3.5–5.1)
Sodium: 134 mmol/L — ABNORMAL LOW (ref 135–145)
Total Bilirubin: 1 mg/dL (ref 0.3–1.2)
Total Protein: 6.1 g/dL — ABNORMAL LOW (ref 6.5–8.1)

## 2023-06-03 LAB — CBC WITH DIFFERENTIAL/PLATELET
Abs Immature Granulocytes: 0.03 10*3/uL (ref 0.00–0.07)
Basophils Absolute: 0.1 10*3/uL (ref 0.0–0.1)
Basophils Relative: 1 %
Eosinophils Absolute: 0.1 10*3/uL (ref 0.0–0.5)
Eosinophils Relative: 1 %
HCT: 29.1 % — ABNORMAL LOW (ref 36.0–46.0)
Hemoglobin: 9.7 g/dL — ABNORMAL LOW (ref 12.0–15.0)
Immature Granulocytes: 0 %
Lymphocytes Relative: 32 %
Lymphs Abs: 2.9 10*3/uL (ref 0.7–4.0)
MCH: 32.7 pg (ref 26.0–34.0)
MCHC: 33.3 g/dL (ref 30.0–36.0)
MCV: 98 fL (ref 80.0–100.0)
Monocytes Absolute: 1.1 10*3/uL — ABNORMAL HIGH (ref 0.1–1.0)
Monocytes Relative: 12 %
Neutro Abs: 5 10*3/uL (ref 1.7–7.7)
Neutrophils Relative %: 54 %
Platelets: 339 10*3/uL (ref 150–400)
RBC: 2.97 MIL/uL — ABNORMAL LOW (ref 3.87–5.11)
RDW: 12.6 % (ref 11.5–15.5)
WBC: 9.2 10*3/uL (ref 4.0–10.5)
nRBC: 0 % (ref 0.0–0.2)

## 2023-06-03 MED ORDER — SORBITOL 70 % SOLN
60.0000 mL | Freq: Once | Status: AC
  Administered 2023-06-03: 60 mL via ORAL
  Filled 2023-06-03: qty 60
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
## 2023-06-03 NOTE — Plan of Care (Signed)
  Problem: RH Balance Goal: LTG: Patient will maintain dynamic sitting balance (OT) Description: LTG:  Patient will maintain dynamic sitting balance with assistance during activities of daily living (OT) Flowsheets (Taken 06/03/2023 1251) LTG: Pt will maintain dynamic sitting balance during ADLs with: Independent with assistive device   Problem: RH Bathing Goal: LTG Patient will bathe all body parts with assist levels (OT) Description: LTG: Patient will bathe all body parts with assist levels (OT) Flowsheets (Taken 06/03/2023 1251) LTG: Pt will perform bathing with assistance level/cueing: Independent with assistive device    Problem: RH Dressing Goal: LTG Patient will perform lower body dressing w/assist (OT) Description: LTG: Patient will perform lower body dressing with assist, with/without cues in positioning using equipment (OT) Flowsheets (Taken 06/03/2023 1251) LTG: Pt will perform lower body dressing with assistance level of: Minimal Assistance - Patient > 75%   Problem: RH Toileting Goal: LTG Patient will perform toileting task (3/3 steps) with assistance level (OT) Description: LTG: Patient will perform toileting task (3/3 steps) with assistance level (OT)  Flowsheets (Taken 06/03/2023 1251) LTG: Pt will perform toileting task (3/3 steps) with assistance level: Minimal Assistance - Patient > 75%   Problem: RH Toilet Transfers Goal: LTG Patient will perform toilet transfers w/assist (OT) Description: LTG: Patient will perform toilet transfers with assist, with/without cues using equipment (OT) Flowsheets (Taken 06/03/2023 1251) LTG: Pt will perform toilet transfers with assistance level of: Independent with assistive device
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
## 2023-06-03 NOTE — Plan of Care (Signed)
  Problem: RH Balance Goal: LTG Patient will maintain dynamic sitting balance (PT) Description: LTG:  Patient will maintain dynamic sitting balance with assistance during mobility activities (PT) Flowsheets (Taken 06/03/2023 1916) LTG: Pt will maintain dynamic sitting balance during mobility activities with:: Independent with assistive device    Problem: RH Bed Mobility Goal: LTG Patient will perform bed mobility with assist (PT) Description: LTG: Patient will perform bed mobility with assistance, with/without cues (PT). Flowsheets (Taken 06/03/2023 1916) LTG: Pt will perform bed mobility with assistance level of: Independent with assistive device    Problem: RH Bed to Chair Transfers Goal: LTG Patient will perform bed/chair transfers w/assist (PT) Description: LTG: Patient will perform bed to chair transfers with assistance (PT). Flowsheets (Taken 06/03/2023 1916) LTG: Pt will perform Bed to Chair Transfers with assistance level: Independent with assistive device    Problem: RH Car Transfers Goal: LTG Patient will perform car transfers with assist (PT) Description: LTG: Patient will perform car transfers with assistance (PT). Flowsheets (Taken 06/03/2023 1916) LTG: Pt will perform car transfers with assist:: Contact Guard/Touching assist   Problem: RH Wheelchair Mobility Goal: LTG Patient will propel w/c in controlled environment (PT) Description: LTG: Patient will propel wheelchair in controlled environment, # of feet with assist (PT) Flowsheets (Taken 06/03/2023 1916) LTG: Pt will propel w/c in controlled environ  assist needed:: Independent with assistive device LTG: Propel w/c distance in controlled environment: 150 Goal: LTG Patient will propel w/c in home environment (PT) Description: LTG: Patient will propel wheelchair in home environment, # of feet with assistance (PT). Flowsheets (Taken 06/03/2023 1916) LTG: Pt will propel w/c in home environ  assist needed:: Independent with  assistive device LTG: Propel w/c distance in home environment: 50
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
## 2023-06-04 DIAGNOSIS — R52 Pain, unspecified: Secondary | ICD-10-CM | POA: Diagnosis not present

## 2023-06-04 DIAGNOSIS — F411 Generalized anxiety disorder: Secondary | ICD-10-CM | POA: Diagnosis not present

## 2023-06-04 DIAGNOSIS — E871 Hypo-osmolality and hyponatremia: Secondary | ICD-10-CM

## 2023-06-04 DIAGNOSIS — I10 Essential (primary) hypertension: Secondary | ICD-10-CM

## 2023-06-04 DIAGNOSIS — S82899D Other fracture of unspecified lower leg, subsequent encounter for closed fracture with routine healing: Secondary | ICD-10-CM

## 2023-06-04 MED ORDER — GABAPENTIN 100 MG PO CAPS
100.0000 mg | ORAL_CAPSULE | Freq: Three times a day (TID) | ORAL | Status: DC
  Administered 2023-06-04 – 2023-06-07 (×9): 100 mg via ORAL
  Filled 2023-06-04 (×9): qty 1

## 2023-06-04 NOTE — Progress Notes (Addendum)
Met with patient. Therapy in room. Patient complaining of heaviness in chest. History of anxiety. Confirms that yes that's most likely what it is. Notified nurse and that has PRN medication for anxiety, and to let MD/PA, and get VS. Was willing to work with PT.  Oriented to rehab, team conference every Tuesday and will discuss progress, barriers, goals, and equipment needs. Confirmed that expected to go back to acute. Nurse reports that unable to get orders for pin care as has orders to not to remove dressing.  All needs met, call bell in reach.

## 2023-06-04 NOTE — Progress Notes (Signed)
Physical Therapy Session Note  Patient Details  Name: Katie [name] MRN: [id number] Date of Birth: 01-13-1966  Today's Date: 06/04/2023 PT Individual Time: 0903-0959 PT Individual Time Calculation (min): 56 min   Short Term Goals: Week 1:  PT Short Term Goal 1 (Week 1): Pt will initiate car trasnfer PT Short Term Goal 2 (Week 1): Pt will propel WC in narrow spaces to simulate home enviornment with supervision PT Short Term Goal 3 (Week 1): Pt will perform bed<>chair transfers with supervision  Skilled Therapeutic Interventions/Progress Updates:     Pt received semi reclined in bed and agrees to therapy. Reports chest pain, attributing it to anxiety. RN aware. PT provides cues for pursed lip breathing and rest breaks as needed to manage pain. PT threads pants over each lower extremity. Pt performs bilateral rolling with bed features and minA to pull up pants. Pt then returns to seated with bed features and increased time with cues for sequencing and body mechanics. PT provides maxA to don TLOS while seated at EOB and pt provided with cues for scooting to EOB to prepare for transfer. Pt performs slideboard transfer to [facility] with minA and cues for anterior trunk lean to optimize safety and ease of transfer, as pt tends to have posterior lean. Pt self propels WC x100' with bilateral upper extremities to work on endurance and mobility training. Pt completes 2x5 seated triceps extension with body weight resistance, seated in WC, with cues for body mechanics and ensuring that pt remains NWB through BLEs. WC transport back to room. Left seated in WC with all needs within reach.   Therapy Documentation Precautions:  Precautions Precautions: Fall Required Braces or Orthoses: Spinal Brace Spinal Brace: Thoracolumbosacral orthotic Spinal Brace Comments: Apply in sitting. Restrictions Weight Bearing Restrictions: Yes RLE Weight Bearing: Non weight bearing LLE Weight Bearing: Non weight  bearing   Therapy/Group: Individual Therapy  Beau Fanny, PT, DPT 06/04/2023, 5:02 PM

## 2023-06-04 NOTE — Progress Notes (Signed)
Occupational Therapy Session Note  Patient Details  Name: Katie [name] MRN: [id number] Date of Birth: [birth year]  Today's Date: 06/04/2023 OT Individual Time: 1300-1415 OT Individual Time Calculation (min): 75 min    Short Term Goals: Week 1:  OT Short Term Goal 1 (Week 1): STGs=LTGs due to patient's length of stay.   Skilled Therapeutic Interventions/Progress Updates:    Pt received lying supine in bed. Pt had c/o pain rated 8/10 in her chest and LE.  Pt stated she recently had some pain medication and was ok.  Pt very socialable and willing to participate in OT treatment session.  Discussion about pts external fixation and current ability with transfers as well as caregiver support and layout of the home.  MD entered room during rounds and spoke with pt.  Pt able to complete slide board transfer from EOB<> wheelchair with Max A for slide board placement and wheelchair stability, Min A for LE management and min verbal cues for technique.  Pt initiated wheelchair propulsion but had c/o of chest pain. OTS assisted patient for rest of distance to the gym.  Pt able to complete UE strengthening exercises 3x10  including wheelchair push up to help increase independence with transfers and independence with ADL care.  UE strenthening exercises with 3lb weighted dowel completed 3x10 ith focus on chest press.  Pt required extended rest breaks related to muscle soreness and fatigue.  Pt assisted back to room where she was able to perform AP transfer with Min A for scooting and for L LE management as well as stabilizations of equipment for safety. Pt left in room to complete toileting tasks and nurse made aware that she would call when she was ready to return to bed.       Therapy Documentation Precautions:  Precautions Precautions: Fall Required Braces or Orthoses: Spinal Brace Spinal Brace: Thoracolumbosacral orthotic Spinal Brace Comments: Apply in sitting. Restrictions Weight Bearing Restrictions:  Yes RLE Weight Bearing: Non weight bearing LLE Weight Bearing: Non weight bearing      Therapy/Group: Individual Therapy  Liam Graham 06/04/2023, 1:13 PM
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
## 2023-06-04 NOTE — Progress Notes (Signed)
Inpatient Rehabilitation  Patient information reviewed and entered into eRehab system by Kelly Gentry, OTR/L, Rehab Quality Coordinator.   Information including medical coding, functional ability and quality indicators will be reviewed and updated through discharge.   

## 2023-06-04 NOTE — Progress Notes (Signed)
Occupational Therapy Session Note  Patient Details  Name: Katie [name] MRN: [id number] Date of Birth: [birth year]  Today's Date: 06/04/2023 OT Individual Time: 3086-5784 OT Individual Time Calculation (min): 65 min  and Today's Date: 06/04/2023 OT Missed Time: 10 Minutes Missed Time Reason: Patient fatigue   Short Term Goals: Week 1:  OT Short Term Goal 1 (Week 1): STGs=LTGs due to patient's length of stay.  Skilled Therapeutic Interventions/Progress Updates:    1:1 Pt received in sitting up in w/c. Pt sat at the sink, still with LEs elevated and participated in bathing and dressing UB at the sink. Pt did decide to don a bra today which helped position her breast better in the TLSO. Educated her on how to get it to fit her better in sitting. However when discussed with PA- order at this time read to only wear when ambulating - so now determined she doesn't have to wear brace in sitting now. Pt understands. Pt performed transfer anterior to [facility] (at end of bed) from w/c- with Legs resting on bed. Pt able to then wash LB and periarea sitting on BSC.  Pt then continuing to transfer anteriorly from the [facility] onto the bed and then turned 180 degrees to face the correct way in the bed demonstrating good reciprocal scooting. Pt continued to finish washing buttocks and A needed to finish pulling up pants over the hips.   Pt left resting in the bed with HOB elevated and call bell in place. See below  Therapy Documentation Precautions:  Precautions Precautions: Fall Required Braces or Orthoses: Spinal Brace Spinal Brace: Thoracolumbosacral orthotic Spinal Brace Comments: Apply in sitting. Restrictions Weight Bearing Restrictions: Yes RLE Weight Bearing: Non weight bearing LLE Weight Bearing: Non weight bearing General: General OT Amount of Missed Time: 10 Minutes Vital Signs: Therapy Vitals Temp: 97.8 F (36.6 C) Temp Source: Oral Pulse Rate: 71 Resp: (!) 23 BP: 115/68 Patient Position (if  appropriate): Lying Oxygen Therapy SpO2: 96 % O2 Device: Room Air Pain: No pain indicated in session but at the end of session reported her chest feeling tight- RN aware and pt requested heat to her right shoulder. Pt left in bed at end of session to rest. Pt missed last 10 min of session. Pt worked hard in transfer today!!     Therapy/Group: Individual Therapy  Roney Mans [facility] 06/04/2023, 11:40 AM

## 2023-06-05 DIAGNOSIS — R52 Pain, unspecified: Secondary | ICD-10-CM | POA: Diagnosis not present

## 2023-06-05 DIAGNOSIS — S82899D Other fracture of unspecified lower leg, subsequent encounter for closed fracture with routine healing: Secondary | ICD-10-CM | POA: Diagnosis not present

## 2023-06-05 DIAGNOSIS — I10 Essential (primary) hypertension: Secondary | ICD-10-CM | POA: Diagnosis not present

## 2023-06-05 DIAGNOSIS — F411 Generalized anxiety disorder: Secondary | ICD-10-CM | POA: Diagnosis not present

## 2023-06-05 MED ORDER — POLYETHYLENE GLYCOL 3350 17 G PO PACK
34.0000 g | PACK | Freq: Once | ORAL | Status: AC
  Administered 2023-06-05: 34 g via ORAL
  Filled 2023-06-05: qty 2

## 2023-06-05 MED ORDER — LIDOCAINE 5 % EX PTCH
2.0000 | MEDICATED_PATCH | CUTANEOUS | Status: DC
  Administered 2023-06-05 – 2023-06-15 (×10): 2 via TRANSDERMAL
  Filled 2023-06-05 (×10): qty 2

## 2023-06-05 NOTE — Progress Notes (Signed)
Physical Therapy Session Note  Patient Details  Name: Katie [name] MRN: [id number] Date of Birth: [birth year]  Today's Date: 06/05/2023 PT Individual Time: 1447-1530 PT Individual Time Calculation (min): 43 min   Short Term Goals: Week 1:  PT Short Term Goal 1 (Week 1): Pt will initiate car trasnfer PT Short Term Goal 2 (Week 1): Pt will propel WC in narrow spaces to simulate home enviornment with supervision PT Short Term Goal 3 (Week 1): Pt will perform bed<>chair transfers with supervision  Skilled Therapeutic Interventions/Progress Updates: Pt presents long sitting in bed and just eating lunch.  Pt states pain just decreased after last therapy session exercises given.  Pt agreeable to review there ex.  Pt performed shoulder rolls, scapular pinches, c/spine rotation and flex/ext, B horizontal add/abd, intercostal stretch 2 x 20.  Pt educated on performance including speed, long stretches as well as breathing techniques during performance.  Pt educated on splinting chest w/ pillow for coughing to improve cough performance.  Pt remained semi-reclined in bed w/ bed alarm on and all needs in reach.     Therapy Documentation Precautions:  Precautions Precautions: Fall Required Braces or Orthoses: Spinal Brace Spinal Brace: Thoracolumbosacral orthotic Spinal Brace Comments: Apply in sitting. Restrictions Weight Bearing Restrictions: Yes RLE Weight Bearing: Non weight bearing LLE Weight Bearing: Non weight bearing General:   Vital Signs: Therapy Vitals Temp: 97.6 F (36.4 C) Temp Source: Oral Pulse Rate: 65 Resp: 18 BP: 103/72 Patient Position (if appropriate): Sitting Oxygen Therapy SpO2: 97 % O2 Device: Room Air Pain:2/10       Therapy/Group: Individual Therapy  Lucio Edward 06/05/2023, 4:06 PM

## 2023-06-05 NOTE — Progress Notes (Signed)
Physical Therapy Session Note  Patient Details  Name: Katie [name] MRN: [id number] Date of Birth: November 29, 1965  Today's Date: 06/05/2023 PT Individual Time: 0865-7846 PT Individual Time Calculation (min): 69 min   Short Term Goals: Week 1:  PT Short Term Goal 1 (Week 1): Pt will initiate car trasnfer PT Short Term Goal 2 (Week 1): Pt will propel WC in narrow spaces to simulate home enviornment with supervision PT Short Term Goal 3 (Week 1): Pt will perform bed<>chair transfers with supervision  Skilled Therapeutic Interventions/Progress Updates:     Pt received semi reclined in bed. Reports significant pain in Lt pec region. Pt reporting she is not sure if she will be able to participate due to pain. PT provides gentle encouragement to participate and offers exercises for shoulders and back muscles to address chest/pec pain. Pt agrees to therapy. Pt mobilizes to edge of bed with bed features and cues for positioning. PT raises bed and has pt prop lower extremities on chair to elevate and prevent additional pain and swelling in legs. PT provides education on diaphragmatic breathing, including benefits for oxygenation (pt reports she cannot get a deep breath due to pain), as well as calming nervous system and addressing pain. Pt incorporates breathing with BUE abduction to midrange to remain in pain free position, x10. Pt then performs scapular retractions with PT providing verbal and tactile cues for optimal performance. X20 with cues for slow, pursed lip breathing. Pt then performs x20 posterior shoulder rolls. PT provides demonstration of sideleaning to alternating sides on forearms, reaching contralateral UE over head for stretch of intercostal muscles as well as latissimus and mid/lower back muscles. Also performed to work on functional mobility with pt transitioning from Lt<>Rt sideleaning.   PT provides pt with red theraband and demonstrates following exercises, with pt performing x10 each with  verbal and tactile cues for correct performance: Bilateral Scapular Retraction Bilateral Shoulder External Rotation Rt then Lt Triceps Extension Overhead Bilateral Row (x20)  Pt returns to supine with verbal cues for sequencing. Left semi reclined in bed with alarm intact and all needs within reach.   Therapy Documentation Precautions:  Precautions Precautions: Fall Required Braces or Orthoses: Spinal Brace Spinal Brace: Thoracolumbosacral orthotic Spinal Brace Comments: Apply in sitting. Restrictions Weight Bearing Restrictions: Yes RLE Weight Bearing: Non weight bearing LLE Weight Bearing: Non weight bearing    Therapy/Group: Individual Therapy  Beau Fanny, PT, DPT 06/05/2023, 5:19 PM

## 2023-06-05 NOTE — Progress Notes (Signed)
Occupational Therapy Session Note  Patient Details  Name: Katie [name] MRN: [id number] Date of Birth: 05/18/1966  Today's Date: 06/05/2023 OT Individual Time: 1130-1145 OT Individual Time Calculation (min): 15 min    Short Term Goals: Week 1:  OT Short Term Goal 1 (Week 1): STGs=LTGs due to patient's length of stay. Week 2:     Skilled Therapeutic Interventions/Progress Updates:    See below for pain. Pt tearful and wanting to get back into bed. Pt performed slide board transfer with min A with +2 holding legs up and min A for management of LEs into bed. Pt applied ice to right ribs and left sitting up in bed. LEft restining in the bed.   Therapy Documentation Precautions:  Precautions Precautions: Fall Required Braces or Orthoses: Spinal Brace Spinal Brace: Thoracolumbosacral orthotic Spinal Brace Comments: Apply in sitting. Restrictions Weight Bearing Restrictions: Yes RLE Weight Bearing: Non weight bearing LLE Weight Bearing: Non weight bearing General:   Vital Signs: Therapy Vitals Temp: 97.6 F (36.4 C) Temp Source: Oral Pulse Rate: 65 Resp: 18 BP: 103/72 Patient Position (if appropriate): Sitting Oxygen Therapy SpO2: 97 % O2 Device: Room Air Pain:  When arrived pt with chest pain and discomfort and in tears. NT was taking vitals and RN giving meds.   Therapy/Group: Individual Therapy  Roney Mans [facility] 06/05/2023, 3:54 PM
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
## 2023-06-05 NOTE — Progress Notes (Signed)
Patient ID: Katie [name], female   DOB: 1966/03/22, 57 y.o.   MRN: [id number]  SW made efforts to meet with pt, but pt using restroom. SW will follow-up to complete assessment.  *SW later met with pt and pt family in room to provide updates from team conference, and d/c date 8/8. Discussed increase in PCS hours. SW will call [facility] [phone]) to discuss increase in hours. Unable to complete assessment as PT session to begin.   SW spoke with [place]/[facility] informing an increase in hours will need to come from insurance.  SW spoke with Amanda/RN with [facility] reporting the same. SW provided updates on pt current condition.   SW left message for LTS Department with [facility] [employer] requesting return phone call about increase in PCS hours. SW waiting on follow-up.   SW left message for Kori/ [facility] CAP/DA program to submit a referral.   Cecile Sheerer, MSW, LCSWA Office: [phone] Cell: [phone] Fax: [phone]

## 2023-06-05 NOTE — Care Management (Signed)
Inpatient Rehabilitation Center Individual Statement of Services  Patient Name:  Katie [name]  Date:  06/05/2023  Welcome to the Inpatient Rehabilitation Center.  Our goal is to provide you with an individualized program based on your diagnosis and situation, designed to meet your specific needs.  With this comprehensive rehabilitation program, you will be expected to participate in at least 3 hours of rehabilitation therapies Monday-Friday, with modified therapy programming on the weekends.  Your rehabilitation program will include the following services:  Physical Therapy (PT), Occupational Therapy (OT), 24 hour per day rehabilitation nursing, Therapeutic Recreaction (TR), Psychology, Neuropsychology, Care Coordinator, Rehabilitation Medicine, Nutrition Services, Pharmacy Services, and Other  Weekly team conferences will be held on Tuesdays to discuss your progress.  Your Inpatient Rehabilitation Care Coordinator will talk with you frequently to get your input and to update you on team discussions.  Team conferences with you and your family in attendance may also be held.  Expected length of stay: 10-12 days  Overall anticipated outcome: Supervision  Depending on your progress and recovery, your program may change. Your Inpatient Rehabilitation Care Coordinator will coordinate services and will keep you informed of any changes. Your Inpatient Rehabilitation Care Coordinator's name and contact numbers are listed  below.  The following services may also be recommended but are not provided by the Inpatient Rehabilitation Center:  Driving Evaluations Home Health Rehabiltiation Services Outpatient Rehabilitation Services Vocational Rehabilitation   Arrangements will be made to provide these services after discharge if needed.  Arrangements include referral to agencies that provide these services.  Your insurance has been verified to be:  [place]  Your primary doctor is:  Earlie Lou  Pertinent information will be shared with your doctor and your insurance company.  Inpatient Rehabilitation Care Coordinator:  Susie Cassette [phone] or ([phone]  Information discussed with and copy given to patient by: Gretchen Short, 06/05/2023, 8:54 AM

## 2023-06-05 NOTE — Progress Notes (Signed)
Occupational Therapy Session Note  Patient Details  Name: Katie [name] MRN: [id number] Date of Birth: [birth year]  Today's Date: 06/05/2023 OT Individual Time: 7829-5621 OT Individual Time Calculation (min): 45 min    Short Term Goals: Week 1:  OT Short Term Goal 1 (Week 1): STGs=LTGs due to patient's length of stay.  Skilled Therapeutic Interventions/Progress Updates:    Pt received lying in bed with HOB elevated.  Pt has c/o pain rated 9/10 in chest.  Pt stated nurse was made aware and pain patch was applied.  Pt receptive to OT treatment session and wished to wash up at the sink.  Pt able to complete slide board transfer from bed to wheelchair with Mod A for placement and removal of slide board. No physical assist required for actual scooting from bed to wheelchair with slide board.  Pt able to complete UB dressing and bathing with set-up at sink level and required Min A for washing and reaching back.  Nurse entered room to reapply gauze to LLE external fixation. Pt able to complete self-care at sink level with (S). OT discussed barriers to d/c related to home layout and caregiver support at home.  Home measurement sheet was provided to pt.  OT educated pt on the use of reacher for increased independence with ADLs when she returns home and discussed how it could be purchased.  Pt was able to complete self-care of brushing teeth at sink level with set-up assist.  Pt wanted to remain in wheelchair until next therapy session.  Call light within reach and all needs met.      Therapy Documentation Precautions:  Precautions Precautions: Fall Required Braces or Orthoses: Spinal Brace Spinal Brace: Thoracolumbosacral orthotic Spinal Brace Comments: Apply in sitting. Restrictions Weight Bearing Restrictions: Yes RLE Weight Bearing: Non weight bearing LLE Weight Bearing: Non weight bearing     Therapy/Group: Individual Therapy  Liam Graham 06/05/2023, 10:38 AM

## 2023-06-06 ENCOUNTER — Encounter (HOSPITAL_COMMUNITY): Payer: Self-pay | Admitting: Orthopedic Surgery

## 2023-06-06 DIAGNOSIS — S82899D Other fracture of unspecified lower leg, subsequent encounter for closed fracture with routine healing: Secondary | ICD-10-CM | POA: Diagnosis not present

## 2023-06-06 DIAGNOSIS — F411 Generalized anxiety disorder: Secondary | ICD-10-CM | POA: Diagnosis not present

## 2023-06-06 DIAGNOSIS — I10 Essential (primary) hypertension: Secondary | ICD-10-CM | POA: Diagnosis not present

## 2023-06-06 DIAGNOSIS — R52 Pain, unspecified: Secondary | ICD-10-CM | POA: Diagnosis not present
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
## 2023-06-06 NOTE — Progress Notes (Signed)
Patient ID: Katie [name], female   DOB: [birth year], 57 y.o.   MRN: [id number]  SW left message for [facility] Department [facility] [phone]) requesting return phone call about increase in PCS hours. SW waiting on follow-up.    Cecile Sheerer, MSW, LCSWA Office: [phone] Cell: [phone] Fax: [phone]

## 2023-06-06 NOTE — Progress Notes (Signed)
Physical Therapy Session Note  Patient Details  Name: Katie [name] MRN: [id number] Date of Birth: [birth year]  Today's Date: 06/06/2023 PT Individual Time: 0930-1049 PT Individual Time Calculation (min): 79 min   Short Term Goals: Week 1:  PT Short Term Goal 1 (Week 1): Pt will initiate car trasnfer PT Short Term Goal 2 (Week 1): Pt will propel WC in narrow spaces to simulate home enviornment with supervision PT Short Term Goal 3 (Week 1): Pt will perform bed<>chair transfers with supervision  Skilled Therapeutic Interventions/Progress Updates:      Pt seated in WC upon arrival. Pt agreeable to therapy. Pt reports she feels much better than yesterday and has been doing the exercises in her room that she did during yesterday therapy. Pt reports 4/10 chest pain and 3/'10 B LE pain (mostly tightnes from the ace bandages). Nurse present at end of session to   Discussed ramp for home entry, pt reports her husband is speaking with the landlord today regarding ramp installation. Discussed car pt reports they have a Zenaida Niece but husband is getting a Probation officer today.   Pt self propelled WC from room, over thresholds, and into/out of large elevator with supervision/in A, verbal cues provided for backing into elevator, pt required min A to get over threshold to exit elevator. Pt required min A on return from first floor to enter small elevator, for time conservation. Pt self propelled WC throughout gift shop to practice navigating tight spaces in home, with supervision, verbal cues provided for technique. Pt ascended/descended ramp outside with B UE, with CGA progressing to close supervision, verbal cues provided for technique, pt required one rest break (in which she locked the whelechiar brakes) when ascending ramp. Pt picked up objects from floor with reacher, education provided to use reacher when picking up objects from floor at home for maintenance of spinal precautions.   Pt performed 1x10  wheelchair pushups, 1x10 B SLR, and B LAQ, verbal cues provided safety.   Pt performed slide board transfer WC to bed with min A, vebral cues provided for neutral trunk versus posterior trunk lean. Pt performed AP transfer bed to [facility] with CGA, vebral cues provided for safety. Pt continent of bowel and bladder, and donned and doffed pants and performed pericare with set up assist.   Therapy Documentation Precautions:  Precautions Precautions: Fall Required Braces or Orthoses: Spinal Brace Spinal Brace: Thoracolumbosacral orthotic Spinal Brace Comments: Apply in sitting. Restrictions Weight Bearing Restrictions: Yes RLE Weight Bearing: Non weight bearing LLE Weight Bearing: Non weight bearing   Therapy/Group: Individual Therapy  [facility] Ambrose Finland, [place], DPT  06/06/2023, 9:31 AM

## 2023-06-06 NOTE — Progress Notes (Signed)
Physical Therapy Session Note  Patient Details  Name: Katie [name] MRN: [id number] Date of Birth: Jun 15, 1966  Today's Date: 06/06/2023 PT Individual Time: 1415-1530 PT Individual Time Calculation (min): 75 min   Short Term Goals: Week 1:  PT Short Term Goal 1 (Week 1): Pt will initiate car trasnfer PT Short Term Goal 2 (Week 1): Pt will propel WC in narrow spaces to simulate home enviornment with supervision PT Short Term Goal 3 (Week 1): Pt will perform bed<>chair transfers with supervision  Skilled Therapeutic Interventions/Progress Updates:     Pt received semi reclined in bed and agrees to therapy. Reports improvement in pain symptoms in Rt chest. PT provides rest breaks as needed to manage pain. Pt performs supine to sit with bed features and cues fo rpositoining at EOB. Pt performs slideboard transfer to [facility] with minA to stabilize board and WC and promote anterior weight shift and trunk lean. Pt self propels WC x300 to dayroom with bilateral upper extremities to work on endurance training and mobility training. Pt performs 3x10 biceps curls and overhead presses, with 4lb weights on each wrist and 3lb bar. Pt then performs slideboard transfer to mat with CGA/minA and improved safety and sequencing. Pt performs sitting balance activity, tasked with holding 3lb bar with both hands and hitting tossed beach ball back to PT. 3x10 with rest break. Pt then completes 3x10 LAQs, hamstring curls with red theraband, and seated rows with red theraband. Pt performs slideboard transfer back to [facility] with minA and cues for positioning of board for safety and posture for optimal weight shifting. Pt self propels WC x200' with upper extremities. Slideboard transfer to bed with same assistance and cues. Left with alarm intact and all needs within reach.   Therapy Documentation Precautions:  Precautions Precautions: Fall Required Braces or Orthoses: Spinal Brace Spinal Brace: Thoracolumbosacral orthotic Spinal  Brace Comments: Apply in sitting. Restrictions Weight Bearing Restrictions: Yes RLE Weight Bearing: Non weight bearing LLE Weight Bearing: Non weight bearing    Therapy/Group: Individual Therapy  Beau Fanny, PT, DPT 06/06/2023, 5:23 PM

## 2023-06-06 NOTE — Progress Notes (Signed)
Occupational Therapy Session Note  Patient Details  Name: Katie [name] MRN: [id number] Date of Birth: [birth year]  Today's Date: 06/06/2023 OT Individual Time: 4098-1191 OT Individual Time Calculation (min): 60 min    Short Term Goals: Week 1:  OT Short Term Goal 1 (Week 1): STGs=LTGs due to patient's length of stay.    Skilled Therapeutic Interventions/Progress Updates:    Pt received sitting up in bed.  Pt awake and alert and receptive to OT treatment session.  Pt reported that pain is better today than yesterday and reported that she has been doing exercises with red theraband this am.  Pt rated pain 4/10 and did not desire to have any pain meds. Pt wished to complete self care at sink.  Pt able to complete slide board transfer with Mod A for placement and removal of slide board but did not require any assistance with scooting.  Pt able to complete wheelchair propulsion independently to sink ~65ft.  Pt able to complete UB bathing and dressing with (S).  Pt required Min A with washing posterior back.  Self-care of brushing teeth completed with (S) sitting at sink in wheelchair. Pt decided she wanted to donn her bra before leaving room and was able to with (S).  Pt receptive to going to gym.  UE strengthening activity completed using 4.4 weighted ball to increase ROM and strength in UE for increased independence with ADLs.Pt able to catch and toss ball 3x10 and activity graded up appropriately by catching and raising ball and arms above head 3x10.  Pt able to complete UE strengthening activity with 4lb weighted dowel and beach ball.  Pt able to complete 3x10.  Pt observed to continue to complete multiple other UE execises indpendenlty without being cued to do so.  Pt back in room and remains in wheelchair per her request.  Call light within reach and all needs met.   Therapy Documentation Precautions:  Precautions Precautions: Fall Required Braces or Orthoses: Spinal Brace Spinal Brace:  Thoracolumbosacral orthotic Spinal Brace Comments: Apply in sitting. Restrictions Weight Bearing Restrictions: Yes RLE Weight Bearing: Non weight bearing LLE Weight Bearing: Non weight bearing      Therapy/Group: Individual Therapy  Liam Graham 06/06/2023, 7:15 AM
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
## 2023-06-07 DIAGNOSIS — G8918 Other acute postprocedural pain: Secondary | ICD-10-CM

## 2023-06-07 DIAGNOSIS — S82899D Other fracture of unspecified lower leg, subsequent encounter for closed fracture with routine healing: Secondary | ICD-10-CM | POA: Diagnosis not present

## 2023-06-07 DIAGNOSIS — K5901 Slow transit constipation: Secondary | ICD-10-CM | POA: Diagnosis not present

## 2023-06-07 DIAGNOSIS — I10 Essential (primary) hypertension: Secondary | ICD-10-CM | POA: Diagnosis not present

## 2023-06-07 MED ORDER — GABAPENTIN 300 MG PO CAPS
300.0000 mg | ORAL_CAPSULE | Freq: Three times a day (TID) | ORAL | Status: DC
  Administered 2023-06-07 – 2023-06-12 (×13): 300 mg via ORAL
  Filled 2023-06-07 (×13): qty 1

## 2023-06-07 NOTE — Progress Notes (Signed)
Physical Therapy Session Note  Patient Details  Name: Katie [name] MRN: [id number] Date of Birth: 1966/01/12  Today's Date: 06/07/2023 PT Individual Time: 1324-4010 PT Individual Time Calculation (min): 70 min   Short Term Goals: Week 1:  PT Short Term Goal 1 (Week 1): Pt will initiate car trasnfer PT Short Term Goal 2 (Week 1): Pt will propel WC in narrow spaces to simulate home enviornment with supervision PT Short Term Goal 3 (Week 1): Pt will perform bed<>chair transfers with supervision  Skilled Therapeutic Interventions/Progress Updates:     Pt received seated in [facility] and agrees to therapy. Reports sharp pain in Rt chest wall, similar to pain she has been having past several days, seemingly musculoskeletal in nature. Pt describes "stressful morning" and attributes pain to events prior to session with PT. PT provides education on pain and stress management, including use of pursed lip breathing to calm nervous system, and incorporating breath work into slow neck rolls for relaxation as well as gentle soft tissue stretch of anterior chest musculature. RN also informed and arrives to provide pt with pain medicine  PT provides WC transport to gym for pt comfort and energy conservation. Pt performs Wii bowling activity for coordination training, as well as performing large amplitude, dynamic movement in multiple planes with visual feedback on performance.   Pt transfers to mat from [facility] with slide board and verbal cues for safe sequencing and positioning of slide board. Pt transitions to prone with verbal cues for positioning. Pt completes prone hamstring curls with verbal and tactile cues for correct performance. Pt completes 2x15 with RLE,2 x10 with LLE (heavier due to external fixation hardware).   Prone to supine to sitting with cues for positioning and body mechanics. Pt performs slideboard transfer form mat>WC>bed with cues for positioning of board and PT providing minA stabilization of WC.,  Left seated in bed with all needs within reach.   Therapy Documentation Precautions:  Precautions Precautions: Fall Required Braces or Orthoses: Spinal Brace Spinal Brace: Thoracolumbosacral orthotic Spinal Brace Comments: Apply in sitting. Restrictions Weight Bearing Restrictions: Yes RLE Weight Bearing: Non weight bearing LLE Weight Bearing: Non weight bearing  Therapy/Group: Individual Therapy  Beau Fanny, PT, DPT 06/07/2023, 4:46 PM
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
## 2023-06-07 NOTE — Progress Notes (Signed)
Occupational Therapy Session Note  Patient Details  Name: Katie [name] MRN: [id number] Date of Birth: 07/21/1966  Today's Date: 06/07/2023 OT Individual Time: 4098-1191 OT Individual Time Calculation (min): 85 min    Today's Date: 06/07/2023 OT Individual Time: 4782-9562    OT Individual Time Calculation (min): 29 min   Short Term Goals: Week 1:  OT Short Term Goal 1 (Week 1): STGs=LTGs due to patient's length of stay.   Skilled Therapeutic Interventions/Progress Updates:  Session 1 Pt received lying supine with HOB elevated.  No c/o pain this am.  Pt receptive to OT treatment session and wished to complete LB dressing.  Self care completed at bed level for easy transition for LB dressing.  Pt able to complete UB bathing and dressing with Set-up at bed level.  LB bathing completed with set-up.  Pt able to complete LB dressing with Mod A for threading clothing over LLE external fixation. Pt agreeable to practice slide board transfers from bed to w/c and was able to facilitate with (S) and min verbal cuing for placement of slide board.  Pt did not require any assist with scooting from bed to wheelchair or LE management.  Wheelchair propulsion from room to gym ~162ft with (S).  Pt practiced slide board transfers from wheelchair<>EOM with (S) and min verbal cuing for technique and safety.  Education provided on safety for transfers from different surfaces and layout of home.  Pt agreeable to go back to room to practice slide board transfers to and from wheelchair to bariatric bedside commode.  Bedside commode and bed positioned to simulate possible set up of home at time of d/c. Pt able to complete transfer from wheelchair to <>commode with (S) and min verbal cuing and min stabilization of equipment for safety x 4 trials. Verbal and physical demonstration provided about placement and removal of leg rest.  Pt able to return demonstrate management of leg rests with increased time and Min verbal cuing for  technique.  Pt wished to remain seated in her wheelchair until next therapy session.  Call light within reach and chair alarm set and all needs met.       Session 2: Pt received lying supine in bed.  Pt receptive to OT treatment session.  Pt agreeable to attend therapeutic dance activity.  Pt taken to day room via wheelchair.  Pt engaged in therapeutic dance activity with focus on UE strengthening, ROM and social engagement. Examples include functional reaching forward and side to side for carry over for increased independence with ADLs.  Pt left sitting in wheelchair with recreation therapist for remainder of therapeutic dance activity. Therapy Documentation Precautions:  Precautions Precautions: Fall Required Braces or Orthoses: Spinal Brace Spinal Brace: Thoracolumbosacral orthotic Spinal Brace Comments: Apply in sitting. Restrictions Weight Bearing Restrictions: Yes RLE Weight Bearing: Non weight bearing LLE Weight Bearing: Non weight bearing     Therapy/Group: Individual Therapy  Liam Graham 06/07/2023, 7:17 AM

## 2023-06-07 NOTE — Progress Notes (Signed)
   06/07/23 1100  Spiritual Encounters  Type of Visit Initial  Care provided to: Patient  Referral source Nurse (RN/NT/LPN)  Reason for visit Advance directives  OnCall Visit No   Ch responded to request for AD. There was no family present. Ch tried to visit pt several time but he unavailable. Pt will page Ch when he is available. Ch remains available.

## 2023-06-07 NOTE — Progress Notes (Signed)
Patient ID: Katie [name], female   DOB: 09-27-1966, 57 y.o.   MRN: [id number]   SW returned PCS prior auth request to [facility]  [phone]).  Cecile Sheerer, MSW, LCSWA Office: [phone] Cell: [phone] Fax: [phone]
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
## 2023-06-08 DIAGNOSIS — I10 Essential (primary) hypertension: Secondary | ICD-10-CM | POA: Diagnosis not present

## 2023-06-08 DIAGNOSIS — K5901 Slow transit constipation: Secondary | ICD-10-CM | POA: Diagnosis not present

## 2023-06-08 DIAGNOSIS — S82899D Other fracture of unspecified lower leg, subsequent encounter for closed fracture with routine healing: Secondary | ICD-10-CM | POA: Diagnosis not present

## 2023-06-08 DIAGNOSIS — G8918 Other acute postprocedural pain: Secondary | ICD-10-CM | POA: Diagnosis not present

## 2023-06-08 MED ORDER — CEFAZOLIN SODIUM-DEXTROSE 2-4 GM/100ML-% IV SOLN
2.0000 g | INTRAVENOUS | Status: AC
  Filled 2023-06-08: qty 100

## 2023-06-08 NOTE — Progress Notes (Signed)
Informed consent has not been signed as of today. Awaiting MD education on procedure. Unsigned consent placed in chart. Cletis Media, RN

## 2023-06-08 NOTE — Group Note (Signed)
Patient Details Name: Katie [name] MRN: [id number] DOB: [birth year] Today's Date: 06/08/2023  Time Calculation: OT Group Time Calculation OT Group Start Time: 1100 OT Group Stop Time: 1200 OT Group Time Calculation (min): 60 min      Group Description: BUE Therex Group: Pt participated in group session with a focus on BUE strength and cardio-endurance to facilitate improved activity tolerance and strength for higher level BADLs and functional mobility tasks with 4-5 lb weights. Also practiced w/c skills including: removing and replacing arm rests, donning and doffing LE rests, w/c mobility around obstacles, propelling w/c ~180 feet. Also introduced use of a reacher to use at home to pick up items from floor- practiced picking up bean bags with reacher. Also engaged in playing corn hole.       Individual level documentation: Pt first in engaged in seated warm up where pt completed neck and shoulder.  Next, pt completed seated bicep curls, shoulder flexion/extension, and forward punches, elbow extension, rowing  Patient able to complete 20 reps of 4 lb weight.  Ended session with guide deep breathing for 3 mins. Discussed benefits of deep breathing to manage stress and pain. Pt propelled w/c back to room with mod I. Pt left seated with all needs within reach and bed alarm/chair alarm activated.   Pain:  No indications of pain      Roney Mans [facility] 06/08/2023, 2:18 PM

## 2023-06-08 NOTE — Progress Notes (Signed)
Patient ID: Katie [name], female   DOB: 1965/12/16, 57 y.o.   MRN: [id number]  SW met with pt to discuss the need for ramp. She reports in order to work on getting a ramp she needs a letter from a physician before submitting through the online portal. SW discussed plans for a ramp in the event that his process may take longer. She is unsure on next steps. She is aware SW will provide letter once ready by a physician.   SW left message for Haze Rushing (p:[phone]:[phone]) with [employer]  to confirm if PCS form was received and inform on NWB bilateral LE's. SW waiting on follow-up. *confirms PCS form received.   Cecile Sheerer, MSW, LCSWA Office: [phone] Cell: [phone] Fax: [phone]

## 2023-06-08 NOTE — Progress Notes (Signed)
Physical Therapy Session Note  Patient Details  Name: Katie [name] MRN: [id number] Date of Birth: 05/06/1966  Today's Date: 06/08/2023 PT Individual Time: 0803-0928 PT Individual Time Calculation (min): 85 min   Short Term Goals: Week 1:  PT Short Term Goal 1 (Week 1): Pt will initiate car trasnfer PT Short Term Goal 2 (Week 1): Pt will propel WC in narrow spaces to simulate home enviornment with supervision PT Short Term Goal 3 (Week 1): Pt will perform bed<>chair transfers with supervision  Skilled Therapeutic Interventions/Progress Updates:    Pt presents in room in bed, reporting pain in L ankle (unrated) but agreeable to PT. RN presents in room for pain med administration at beginning of session. Session focused on mass practice for slideboard transfers, and therex for BUE/BLE strengthening needed for functional transfers.  Pt completes bed mobility with distant supervision. Pt completes slideboard transfer bed to [facility] with min assist for stabilizing WC, slideboard placement, and LE management. Pt self prpoels ~150' with WC supervision, continues to demonstrate poor contact with L wheel on ground however pt does demonstrate improved control this session compared to previous session, increased time required to complete.  Pt completes mass practice slideboard transfer training x5 WC<>mat, pt able to manage WC parts without assist. Pt completes slideboard transfers consistently with supervision, able to verbalize proper placement of slideboard and body mechanics required for safety, maintains NWB BLE throughout.  Pt completes supine therex for BLE/BUE strengthening needed for functional transfers, bed mobility, and to prepare for ambulation including: -Supine marches 2x5 BLE -SLR 2x5 BLE -SAQ  2x10 BLE -Modified bridges (knees on bolster maintaining SAQ bilaterally) 2x10 -Chest press 3# bar 2x10 -Straight arm lat pull down 3# bar 2x10 *Pt provided with therapeutic rest breaks between  exercises to promote energy conservation and improved tolerance to activity. Pt provided with frequent verbal cues for optimal breathing pattern with exercise with pt demonstrating improvement with repetition.  Pt complets supine to sit distant supervision, completes slideboard transfer with supervision only.  Pt returns to room self propelling modI and remains seated in [facility] with all needs within reach, cal light in place and chair alarm donned and activated at end of session.   Therapy Documentation Precautions:  Precautions Precautions: Fall Required Braces or Orthoses: Spinal Brace Spinal Brace: Thoracolumbosacral orthotic Spinal Brace Comments: Apply in sitting. Restrictions Weight Bearing Restrictions: Yes RLE Weight Bearing: Non weight bearing LLE Weight Bearing: Non weight bearing  Therapy/Group: Individual Therapy  Edwin Cap PT, DPT 06/08/2023, 12:31 PM

## 2023-06-08 NOTE — Discharge Instructions (Addendum)
Inpatient Rehab Discharge Instructions  Katie [name] Discharge date and time:  06/15/23  Activities/Precautions/ Functional Status: Activity: activity as tolerated Diet: regular diet Wound Care: Keep splint clean and dry. DO NOT PUT ANY WEIGHT ON LEGS.    Functional status:  ___ No restrictions     ___ Walk up steps independently _X__ 24/7 supervision/assistance   ___ Walk up steps with assistance ___ Intermittent supervision/assistance  ___ Bathe/dress independently ___ Walk with walker     _X__ Bathe/dress with assistance ___ Walk Independently    ___ Shower independently ___ Walk with assistance    ___ Shower with assistance _X__ No alcohol     ___ Return to work/school ________   Special Instructions: Use back brace when they clear you for walking --follow up with Dr. Yetta Barre for final input.   COMMUNITY REFERRALS UPON DISCHARGE:    DUE TO NON-WEIGHT BEARING STATUS, NO THERAPIES WILL BE ARRANGED. BE SURE TO USE HOME EXERCISE PROGRAM PROVIDED. ONCE WEIGHT BEARING STATUS CHANGES, DISCUSS WITH ORTHOPEDIC OR PCP ABOUT A REFERRAL FOR THERAPIES.   Medical Equipment/Items Ordered:DROP ARM BEDSIDE COMMODE, TRANSFER BOARD, [facility] BED                                                 Agency/Supplier: [employer] [employer] [phone]     My questions have been answered and I understand these instructions. I will adhere to these goals and the provided educational materials after my discharge from the hospital.  Patient/Caregiver Signature _______________________________ Date __________  Clinician Signature _______________________________________ Date __________  Please bring this form and your medication list with you to all your follow-up doctor's appointments.   Information on my medicine - XARELTO (rivaroxaban)  This medication education was reviewed with me or my healthcare representative as part of my discharge preparation.   WHY WAS XARELTO PRESCRIBED FOR  YOU? Xarelto was prescribed to treat blood clots that may have been found in the veins of your legs (deep vein thrombosis) or in your lungs (pulmonary embolism) and to reduce the risk of them occurring again.  What do you need to know about Xarelto? Continue Xarelto 20 mg tablet taken ONCE A DAY with your evening meal.  DO NOT stop taking Xarelto without talking to the health care provider who prescribed the medication.  Refill your prescription for 20 mg tablets before you run out.  After discharge, you should have regular check-up appointments with your healthcare provider that is prescribing your Xarelto.  In the future your dose may need to be changed if your kidney function changes by a significant amount.  What do you do if you miss a dose? If you are taking Xarelto TWICE DAILY and you miss a dose, take it as soon as you remember. You may take two 15 mg tablets (total 30 mg) at the same time then resume your regularly scheduled 15 mg twice daily the next day.  If you are taking Xarelto ONCE DAILY and you miss a dose, take it as soon as you remember on the same day then continue your regularly scheduled once daily regimen the next day. Do not take two doses of Xarelto at the same time.   Important Safety Information Xarelto is a blood thinner medicine that can cause bleeding. You should call your healthcare provider right away if you experience any of the following:  Bleeding from an injury or your nose that does not stop. Unusual colored urine (red or dark brown) or unusual colored stools (red or black). Unusual bruising for unknown reasons. A serious fall or if you hit your head (even if there is no bleeding).  Some medicines may interact with Xarelto and might increase your risk of bleeding while on Xarelto. To help avoid this, consult your healthcare provider or pharmacist prior to using any new prescription or non-prescription medications, including herbals, vitamins,  non-steroidal anti-inflammatory drugs (NSAIDs) and supplements.  This website has more information on Xarelto: VisitDestination.com.br.

## 2023-06-08 NOTE — Progress Notes (Signed)
Physical Therapy Session Note  Patient Details  Name: NITISHA CIVELLO MRN: [id number] Date of Birth: 1966-05-15  Today's Date: 06/08/2023 PT Individual Time: 4098-1191 PT Individual Time Calculation (min): 40 min   Short Term Goals: Week 1:  PT Short Term Goal 1 (Week 1): Pt will initiate car trasnfer PT Short Term Goal 2 (Week 1): Pt will propel WC in narrow spaces to simulate home enviornment with supervision PT Short Term Goal 3 (Week 1): Pt will perform bed<>chair transfers with supervision  Skilled Therapeutic Interventions/Progress Updates:     Pt received semi reclined in bed and agrees to therapy. No complaint of pain. Pt performs slideboard transfer from bed to [facility] with minA stabilization of WC and cues for body mechanics. WC transport to gym for time management. PT provides demonstration of LAQs with 3 count isometric hold at end range extension. Pt completes 4x10 with each leg while PT replaced Lt leg rest on WC. Pt then completes upper extremity strengthening activity with red theraband, completing x20 scapular retractions with full horizontal abduction, bilateral shoulder external rotation, triceps extensions overhead. Cues provided for increased NM feedback and improved performance. WC transport back to room. Pt performs slide board transfer from [facility] to bed with cues for sequencing and positioning. Left semi reclined with alarm intact and all needs within reach.   Therapy Documentation Precautions:  Precautions Precautions: Fall Required Braces or Orthoses: Spinal Brace Spinal Brace: Thoracolumbosacral orthotic Spinal Brace Comments: Apply in sitting. Restrictions Weight Bearing Restrictions: Yes RLE Weight Bearing: Non weight bearing LLE Weight Bearing: Non weight bearing   Therapy/Group: Individual Therapy  Beau Fanny, PT, DPT 06/08/2023, 4:47 PM
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
## 2023-06-08 NOTE — Plan of Care (Signed)
  Problem: Consults Goal: RH GENERAL PATIENT EDUCATION Description: See Patient Education module for education specifics. Outcome: Progressing Goal: Skin Care Protocol Initiated - if Braden Score 18 or less Description: If consults are not indicated, leave blank or document N/A Outcome: Progressing   Problem: RH BOWEL ELIMINATION Goal: RH STG MANAGE BOWEL WITH ASSISTANCE Description: STG Manage Bowel with Mod I Assistance. Outcome: Progressing Goal: RH STG MANAGE BOWEL W/MEDICATION W/ASSISTANCE Description: STG Manage Bowel with Medication with Mod I Assistance. Outcome: Progressing   Problem: RH SKIN INTEGRITY Goal: RH STG MAINTAIN SKIN INTEGRITY WITH ASSISTANCE Description: STG Maintain Skin Integrity With Mod I Assistance. Outcome: Progressing Goal: RH STG ABLE TO PERFORM INCISION/WOUND CARE W/ASSISTANCE Description: STG Able To Perform Incision/Wound Care With Mod I Assistance. Outcome: Progressing   Problem: RH SAFETY Goal: RH STG ADHERE TO SAFETY PRECAUTIONS W/ASSISTANCE/DEVICE Description: STG Adhere to Safety Precautions With Mod I at wheelchair level. Outcome: Progressing   Problem: RH PAIN MANAGEMENT Goal: RH STG PAIN MANAGED AT OR BELOW PT'S PAIN GOAL Description: Less than 4 on a 0-10 p Outcome: Progressing   Problem: RH KNOWLEDGE DEFICIT GENERAL Goal: RH STG INCREASE KNOWLEDGE OF SELF CARE AFTER HOSPITALIZATION Description: Patient/caregiver will be able to manage medications and self care from nursing education and nursing handouts independently Outcome: Progressing

## 2023-06-09 DIAGNOSIS — S82899D Other fracture of unspecified lower leg, subsequent encounter for closed fracture with routine healing: Secondary | ICD-10-CM | POA: Diagnosis not present

## 2023-06-09 LAB — BASIC METABOLIC PANEL
Anion gap: 11 (ref 5–15)
BUN: 5 mg/dL — ABNORMAL LOW (ref 6–20)
CO2: 29 mmol/L (ref 22–32)
Calcium: 8.8 mg/dL — ABNORMAL LOW (ref 8.9–10.3)
Chloride: 93 mmol/L — ABNORMAL LOW (ref 98–111)
Creatinine, Ser: 0.69 mg/dL (ref 0.44–1.00)
GFR, Estimated: >60 mL/min (ref >60)
Glucose, Bld: 105 mg/dL — ABNORMAL HIGH (ref 70–99)
Potassium: 3.8 mmol/L (ref 3.5–5.1)
Sodium: 133 mmol/L — ABNORMAL LOW (ref 135–145)

## 2023-06-09 LAB — TROPONIN I (HIGH SENSITIVITY)
Troponin I (High Sensitivity): 6 ng/L (ref <18)
Troponin I (High Sensitivity): 4 ng/L (ref <18)

## 2023-06-09 LAB — MAGNESIUM: Magnesium: 1.5 mg/dL — ABNORMAL LOW (ref 1.7–2.4)

## 2023-06-09 MED ORDER — DIAZEPAM 5 MG PO TABS
5.0000 mg | ORAL_TABLET | Freq: Two times a day (BID) | ORAL | Status: DC | PRN
  Administered 2023-06-12: 5 mg via ORAL
  Filled 2023-06-09: qty 1

## 2023-06-09 MED ORDER — MAGNESIUM GLUCONATE 500 MG PO TABS
250.0000 mg | ORAL_TABLET | Freq: Every day | ORAL | Status: DC
  Administered 2023-06-09 – 2023-06-15 (×6): 250 mg via ORAL
  Filled 2023-06-09 (×6): qty 1

## 2023-06-09 NOTE — Progress Notes (Signed)
Occupational Therapy Weekly Progress Note  Patient Details  Name: Katie [name] MRN: [id number] Date of Birth: [birth year]  Beginning of progress report period: June 03, 2023 End of progress report period: June 09, 2023  Today's Date: 06/09/2023 OT Individual Time: 1405-1430 OT Individual Time Calculation (min): 25 min    Patient making strong progress towards LTGs this reporting period, despite medical complications including low BP. Pt currently functioning at setup/supervision assist level for UB ADLs, requiring closer to light Mod A for LB ADLs and toileting. Pt progressed from AP transfers at evaluation to slide board transfers with Min A. Caregiver education to be scheduled as appropriate.   Patient continues to demonstrate the following deficits: muscle weakness and decreased cardiorespiratoy endurance and therefore will continue to benefit from skilled OT intervention to enhance overall performance with BADL and Reduce care partner burden.  Patient progressing toward long term goals..  Continue plan of care.  OT Short Term Goals Week 1:  OT Short Term Goal 1 (Week 1): STGs=LTGs due to patient's length of stay. OT Short Term Goal 1 - Progress (Week 1): Progressing toward goal Week 2:  OT Short Term Goal 1 (Week 2): STGs=LTGs due to patient's length of stay.  Skilled Therapeutic Interventions/Progress Updates:  Pt received resting in bed with spouse present in room. Pt shares concerns in regards to upcoming procedure and "funky" BP. BP assessed, readings below: R-arm=84/52  L-arm=92/51   RN/MD made aware as patient was having lab work preformed. MD then placed patient on therapy hold, as OT assisted in communication of symptoms. Pt with pain in BLE at surgical site, RN updated.   Pt remained resting in bed with all immediate needs met at end of session. Pt continues to be appropriate for skilled OT intervention to promote further functional independence.   Therapy  Documentation Precautions:  Precautions Precautions: Fall Required Braces or Orthoses: Spinal Brace Spinal Brace: Thoracolumbosacral orthotic Spinal Brace Comments: Apply in sitting. Restrictions Weight Bearing Restrictions: Yes RLE Weight Bearing: Non weight bearing LLE Weight Bearing: Non weight bearing   Therapy/Group: Individual Therapy  Lou Cal, OTR/L, MSOT  06/09/2023, 5:41 AM

## 2023-06-09 NOTE — Plan of Care (Signed)
  Problem: Consults Goal: RH GENERAL PATIENT EDUCATION Description: See Patient Education module for education specifics. Outcome: Progressing Goal: Skin Care Protocol Initiated - if Braden Score 18 or less Description: If consults are not indicated, leave blank or document N/A Outcome: Progressing   Problem: RH BOWEL ELIMINATION Goal: RH STG MANAGE BOWEL WITH ASSISTANCE Description: STG Manage Bowel with Mod I Assistance. Outcome: Progressing Goal: RH STG MANAGE BOWEL W/MEDICATION W/ASSISTANCE Description: STG Manage Bowel with Medication with Mod I Assistance. Outcome: Progressing   Problem: RH SKIN INTEGRITY Goal: RH STG MAINTAIN SKIN INTEGRITY WITH ASSISTANCE Description: STG Maintain Skin Integrity With Mod I Assistance. Outcome: Progressing Goal: RH STG ABLE TO PERFORM INCISION/WOUND CARE W/ASSISTANCE Description: STG Able To Perform Incision/Wound Care With Mod I Assistance. Outcome: Progressing   Problem: RH SAFETY Goal: RH STG ADHERE TO SAFETY PRECAUTIONS W/ASSISTANCE/DEVICE Description: STG Adhere to Safety Precautions With Mod I at wheelchair level. Outcome: Progressing   Problem: RH PAIN MANAGEMENT Goal: RH STG PAIN MANAGED AT OR BELOW PT'S PAIN GOAL Description: Less than 4 on a 0-10 p Outcome: Progressing   Problem: RH KNOWLEDGE DEFICIT GENERAL Goal: RH STG INCREASE KNOWLEDGE OF SELF CARE AFTER HOSPITALIZATION Description: Patient/caregiver will be able to manage medications and self care from nursing education and nursing handouts independently Outcome: Progressing
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
## 2023-06-09 NOTE — Progress Notes (Signed)
Physical Therapy Session Note  Patient Details  Name: Katie [name] MRN: [id number] Date of Birth: 1966-06-30  Today's Date: 06/09/2023 PT Individual Time: 1012-1105 PT Individual Time Calculation (min): 53 min   Short Term Goals: Week 1:  PT Short Term Goal 1 (Week 1): Pt will initiate car trasnfer PT Short Term Goal 2 (Week 1): Pt will propel WC in narrow spaces to simulate home enviornment with supervision PT Short Term Goal 3 (Week 1): Pt will perform bed<>chair transfers with supervision  Skilled Therapeutic Interventions/Progress Updates:  Patient supine in bed on entrance to room. Patient alert and agreeable to PT session.   Patient with very minimal pain complaint at start of session. Relates increase in pain only occurs with changes in LE positioning as well as when LLE support is given to leg and not with use of external fixator arms. Relates surgeon prefers use of fixator arms to assist with LE mobilization when necessary.  Therapeutic Activity: Bed Mobility/ Transfers: Pt demos good sitting balance from long sitting in bed. Relates difficulty with attaching/ removing elevating leg rests while seated on w/c. Demonstrated to pt while still in bed and therapist seated in w/c with visual demo and verbal instructions provided for improved understanding or parts mgmt. Related to pt that legrests can be managed from bed prior to transfer and legrests swung out to sides prior to AP transfer to w/c. Pt is able to manage bedrails, place legrests on w/c, position w/c in locked position prior to initiating transfer, pivot in bed, initiate bkwd scoot into w/c while PT providing overall supervision for all and CGA for holding w/c in place during transfer for safety. When seated in w/c, she is able to slowly back w/c and move LE toward EOB. Swings R leg rest under RLE and then able to position LLE on L leg rest. Back from bed.   Discussion with pt re: performing PA scoot to return to bed. Pt excited  to attempt. Pt is then able to return w/c to bed and reverse technique for AP in order to safely return to bed with CGA for hold of w/c for increased safety during transfer. Pivots back to lie supine on bed and is very excited re: reduced amount of effort required compared to slide board. Relates that slide board will be important for car transfers and will still need to practice in order to improve. But now pt has option for return to bed. Assists with lift of LEs for appropriate pillow positioning to elevate lower legs and float heels.   Discussion re: types of clothing that pt can wear for improved transfers and mobility here and at home. Once ex fix is removed on Monday, pt should be able to fit more of her pants over LLE.   Patient supine in bed at end of session with brakes locked, bed alarm set, and all needs within reach.   Therapy Documentation Precautions:  Precautions Precautions: Fall Required Braces or Orthoses: Spinal Brace Spinal Brace: Thoracolumbosacral orthotic Spinal Brace Comments: Apply in sitting. Restrictions Weight Bearing Restrictions: Yes RLE Weight Bearing: Non weight bearing LLE Weight Bearing: Non weight bearing  Pain: Pt relates low level pain that does not increase throughout session, assisted pt with improved positioning.   Therapy/Group: Individual Therapy  Loel Dubonnet PT, DPT, CSRS 06/09/2023, 5:43 PM

## 2023-06-10 DIAGNOSIS — S82899D Other fracture of unspecified lower leg, subsequent encounter for closed fracture with routine healing: Secondary | ICD-10-CM | POA: Diagnosis not present

## 2023-06-10 LAB — SURGICAL PCR SCREEN
MRSA, PCR: NEGATIVE
Staphylococcus aureus: NEGATIVE
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
## 2023-06-10 NOTE — Progress Notes (Addendum)
Went to get patient to sign her consent form for surgery, patient refuses stating that no one has spoken to her about this surgery and she was told not to sign it until this happens. Informed charge nurse.

## 2023-06-10 NOTE — Plan of Care (Signed)
  Problem: Consults Goal: RH GENERAL PATIENT EDUCATION Description: See Patient Education module for education specifics. Outcome: Progressing Goal: Skin Care Protocol Initiated - if Braden Score 18 or less Description: If consults are not indicated, leave blank or document N/A Outcome: Progressing   Problem: RH BOWEL ELIMINATION Goal: RH STG MANAGE BOWEL WITH ASSISTANCE Description: STG Manage Bowel with Mod I Assistance. Outcome: Progressing Goal: RH STG MANAGE BOWEL W/MEDICATION W/ASSISTANCE Description: STG Manage Bowel with Medication with Mod I Assistance. Outcome: Progressing   Problem: RH SKIN INTEGRITY Goal: RH STG MAINTAIN SKIN INTEGRITY WITH ASSISTANCE Description: STG Maintain Skin Integrity With Mod I Assistance. Outcome: Progressing Goal: RH STG ABLE TO PERFORM INCISION/WOUND CARE W/ASSISTANCE Description: STG Able To Perform Incision/Wound Care With Mod I Assistance. Outcome: Progressing   Problem: RH SAFETY Goal: RH STG ADHERE TO SAFETY PRECAUTIONS W/ASSISTANCE/DEVICE Description: STG Adhere to Safety Precautions With Mod I at wheelchair level. Outcome: Progressing   Problem: RH PAIN MANAGEMENT Goal: RH STG PAIN MANAGED AT OR BELOW PT'S PAIN GOAL Description: Less than 4 on a 0-10 p Outcome: Progressing   Problem: RH KNOWLEDGE DEFICIT GENERAL Goal: RH STG INCREASE KNOWLEDGE OF SELF CARE AFTER HOSPITALIZATION Description: Patient/caregiver will be able to manage medications and self care from nursing education and nursing handouts independently Outcome: Progressing
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
## 2023-06-11 ENCOUNTER — Inpatient Hospital Stay (HOSPITAL_COMMUNITY): Payer: Commercial Managed Care - HMO

## 2023-06-11 ENCOUNTER — Inpatient Hospital Stay (HOSPITAL_COMMUNITY): Payer: Commercial Managed Care - HMO | Admitting: Anesthesiology

## 2023-06-11 ENCOUNTER — Other Ambulatory Visit: Payer: Self-pay

## 2023-06-11 ENCOUNTER — Inpatient Hospital Stay (HOSPITAL_COMMUNITY): Payer: Self-pay | Admitting: Anesthesiology

## 2023-06-11 ENCOUNTER — Encounter (HOSPITAL_COMMUNITY)
Admission: RE | Disposition: A | Discharge status: Home / Self Care | Payer: Self-pay | Source: Intra-hospital | Attending: Physical Medicine and Rehabilitation

## 2023-06-11 ENCOUNTER — Ambulatory Visit (HOSPITAL_COMMUNITY)
Admission: RE | Admit: 2023-06-11 | Payer: Commercial Managed Care - HMO | Source: Home / Self Care | Admitting: Orthopedic Surgery

## 2023-06-11 ENCOUNTER — Encounter (HOSPITAL_COMMUNITY): Payer: Self-pay | Admitting: Physical Medicine and Rehabilitation

## 2023-06-11 DIAGNOSIS — S82872A Displaced pilon fracture of left tibia, initial encounter for closed fracture: Secondary | ICD-10-CM

## 2023-06-11 DIAGNOSIS — S82899S Other fracture of unspecified lower leg, sequela: Secondary | ICD-10-CM

## 2023-06-11 DIAGNOSIS — F418 Other specified anxiety disorders: Secondary | ICD-10-CM

## 2023-06-11 DIAGNOSIS — S82899D Other fracture of unspecified lower leg, subsequent encounter for closed fracture with routine healing: Secondary | ICD-10-CM | POA: Diagnosis not present

## 2023-06-11 DIAGNOSIS — D72829 Elevated white blood cell count, unspecified: Secondary | ICD-10-CM | POA: Diagnosis not present

## 2023-06-11 DIAGNOSIS — I10 Essential (primary) hypertension: Secondary | ICD-10-CM

## 2023-06-11 DIAGNOSIS — F54 Psychological and behavioral factors associated with disorders or diseases classified elsewhere: Secondary | ICD-10-CM

## 2023-06-11 DIAGNOSIS — D62 Acute posthemorrhagic anemia: Secondary | ICD-10-CM | POA: Diagnosis not present

## 2023-06-11 DIAGNOSIS — Z6841 Body Mass Index (BMI) 40.0 and over, adult: Secondary | ICD-10-CM

## 2023-06-11 HISTORY — PX: OPEN REDUCTION INTERNAL FIXATION (ORIF) TIBIA/FIBULA FRACTURE: SHX5992

## 2023-06-11 HISTORY — PX: EXTERNAL FIXATION REMOVAL: SHX5040

## 2023-06-11 SURGERY — OPEN REDUCTION INTERNAL FIXATION (ORIF) TIBIA/FIBULA FRACTURE
Anesthesia: General | Site: Ankle | Laterality: Left

## 2023-06-11 MED ORDER — BUPIVACAINE HCL (PF) 0.5 % IJ SOLN
INTRAMUSCULAR | Status: DC | PRN
Start: 2023-06-11 — End: 2023-06-11
  Administered 2023-06-11: 10 mL via PERINEURAL

## 2023-06-11 MED ORDER — SUGAMMADEX SODIUM 200 MG/2ML IV SOLN
INTRAVENOUS | Status: DC | PRN
  Administered 2023-06-11: 200 mg via INTRAVENOUS

## 2023-06-11 MED ORDER — VANCOMYCIN HCL 1000 MG IV SOLR
INTRAVENOUS | Status: AC
  Filled 2023-06-11: qty 20

## 2023-06-11 MED ORDER — BUPIVACAINE LIPOSOME 1.3 % IJ SUSP
INTRAMUSCULAR | Status: AC
  Filled 2023-06-11: qty 10

## 2023-06-11 MED ORDER — ROCURONIUM BROMIDE 10 MG/ML (PF) SYRINGE
PREFILLED_SYRINGE | INTRAVENOUS | Status: DC | PRN
  Administered 2023-06-11: 50 mg via INTRAVENOUS
  Administered 2023-06-11: 5 mg via INTRAVENOUS
  Administered 2023-06-11: 10 mg via INTRAVENOUS

## 2023-06-11 MED ORDER — MIDAZOLAM HCL 2 MG/2ML IJ SOLN: 2.0000 mg | Freq: Once | INTRAMUSCULAR | Status: AC

## 2023-06-11 MED ORDER — PROPOFOL 10 MG/ML IV BOLUS
INTRAVENOUS | Status: DC | PRN
Start: 2023-06-11 — End: 2023-06-11
  Administered 2023-06-11: 50 mg via INTRAVENOUS
  Administered 2023-06-11: 150 mg via INTRAVENOUS

## 2023-06-11 MED ORDER — BUPIVACAINE LIPOSOME 1.3 % IJ SUSP
INTRAMUSCULAR | Status: DC | PRN
  Administered 2023-06-11: 10 mL via PERINEURAL

## 2023-06-11 MED ORDER — CHLORHEXIDINE GLUCONATE 0.12 % MT SOLN: 15.0000 mL | Freq: Once | OROMUCOSAL | Status: AC

## 2023-06-11 MED ORDER — ROCURONIUM BROMIDE 10 MG/ML (PF) SYRINGE
PREFILLED_SYRINGE | INTRAVENOUS | Status: AC
  Filled 2023-06-11: qty 10

## 2023-06-11 MED ORDER — LIDOCAINE 2% (20 MG/ML) 5 ML SYRINGE
INTRAMUSCULAR | Status: AC
  Filled 2023-06-11: qty 5

## 2023-06-11 MED ORDER — LACTATED RINGERS IV SOLN: INTRAVENOUS | Status: DC

## 2023-06-11 MED ORDER — CEFAZOLIN SODIUM-DEXTROSE 2-3 GM-%(50ML) IV SOLR
INTRAVENOUS | Status: DC | PRN
  Administered 2023-06-11: 2 g via INTRAVENOUS

## 2023-06-11 MED ORDER — ORAL CARE MOUTH RINSE: 15.0000 mL | Freq: Once | OROMUCOSAL | Status: AC

## 2023-06-11 MED ORDER — ONDANSETRON HCL 4 MG/2ML IJ SOLN
INTRAMUSCULAR | Status: DC | PRN
Start: 2023-06-11 — End: 2023-06-11
  Administered 2023-06-11: 4 mg via INTRAVENOUS

## 2023-06-11 MED ORDER — CHLORHEXIDINE GLUCONATE 0.12 % MT SOLN
OROMUCOSAL | Status: AC
  Administered 2023-06-11: 15 mL via OROMUCOSAL
  Filled 2023-06-11: qty 15

## 2023-06-11 MED ORDER — FENTANYL CITRATE (PF) 250 MCG/5ML IJ SOLN
INTRAMUSCULAR | Status: AC
  Filled 2023-06-11: qty 5

## 2023-06-11 MED ORDER — HYDROMORPHONE HCL 1 MG/ML IJ SOLN
1.0000 mg | INTRAMUSCULAR | Status: AC | PRN
  Administered 2023-06-12 (×2): 2 mg via INTRAVENOUS
  Administered 2023-06-12 (×2): 1 mg via INTRAVENOUS
  Administered 2023-06-12: 2 mg via INTRAVENOUS
  Administered 2023-06-13: 1 mg via INTRAVENOUS
  Filled 2023-06-11: qty 2
  Filled 2023-06-11 (×2): qty 1
  Filled 2023-06-11: qty 2
  Filled 2023-06-11: qty 1
  Filled 2023-06-11: qty 2

## 2023-06-11 MED ORDER — PROPOFOL 10 MG/ML IV BOLUS
INTRAVENOUS | Status: AC
  Filled 2023-06-11: qty 20

## 2023-06-11 MED ORDER — FENTANYL CITRATE (PF) 100 MCG/2ML IJ SOLN
INTRAMUSCULAR | Status: AC
  Administered 2023-06-11: 100 ug via INTRAVENOUS
  Filled 2023-06-11: qty 2

## 2023-06-11 MED ORDER — FENTANYL CITRATE (PF) 100 MCG/2ML IJ SOLN: 100.0000 ug | Freq: Once | INTRAMUSCULAR | Status: AC

## 2023-06-11 MED ORDER — 0.9 % SODIUM CHLORIDE (POUR BTL) OPTIME
TOPICAL | Status: DC | PRN
  Administered 2023-06-11: 1000 mL

## 2023-06-11 MED ORDER — MIDAZOLAM HCL 2 MG/2ML IJ SOLN
INTRAMUSCULAR | Status: AC
  Administered 2023-06-11: 2 mg via INTRAVENOUS
  Filled 2023-06-11: qty 2

## 2023-06-11 MED ORDER — MIDAZOLAM HCL 2 MG/2ML IJ SOLN
INTRAMUSCULAR | Status: AC
  Filled 2023-06-11: qty 2

## 2023-06-11 MED ORDER — ROPIVACAINE HCL 5 MG/ML IJ SOLN
INTRAMUSCULAR | Status: DC | PRN
  Administered 2023-06-11: 15 mL via PERINEURAL

## 2023-06-11 SURGICAL SUPPLY — 75 items
BAG COUNTER SPONGE SURGICOUNT (BAG) ×2 IMPLANT
BAG SPNG CNTER NS LX DISP (BAG) ×1
BIT DRILL 2.5MM SMALL QC EVOS (BIT) IMPLANT
BIT DRILL QC 2.5MM SHRT EVO SM (DRILL) IMPLANT
BIT DRILL SHORT 2.0 ZI (BIT) IMPLANT
BNDG CMPR MED 15X6 ELC VLCR LF (GAUZE/BANDAGES/DRESSINGS) ×1
BNDG CMPR STD VLCR NS LF 5.8X4 (GAUZE/BANDAGES/DRESSINGS) ×1
BNDG ELASTIC 4X5.8 VLCR NS LF (GAUZE/BANDAGES/DRESSINGS) IMPLANT
BNDG ELASTIC 4X5.8 VLCR STR LF (GAUZE/BANDAGES/DRESSINGS) ×2 IMPLANT
BNDG ELASTIC 6X15 VLCR STRL LF (GAUZE/BANDAGES/DRESSINGS) IMPLANT
BNDG ELASTIC 6X5.8 VLCR STR LF (GAUZE/BANDAGES/DRESSINGS) ×2 IMPLANT
BNDG GAUZE DERMACEA FLUFF 4 (GAUZE/BANDAGES/DRESSINGS) ×4 IMPLANT
BNDG GZE DERMACEA 4 6PLY (GAUZE/BANDAGES/DRESSINGS) ×1
BRUSH SCRUB EZ PLAIN DRY (MISCELLANEOUS) ×4 IMPLANT
COVER SURGICAL LIGHT HANDLE (MISCELLANEOUS) ×4 IMPLANT
DRAPE C-ARM 42X72 X-RAY (DRAPES) IMPLANT
DRAPE C-ARMOR (DRAPES) ×2 IMPLANT
DRAPE U-SHAPE 47X51 STRL (DRAPES) ×2 IMPLANT
DRILL 2.5MM SMALL QC EVOS (BIT) ×1
DRILL QC 2.5MM SHORT EVOS SM (DRILL) ×1
DRSG ADAPTIC 3X8 NADH LF (GAUZE/BANDAGES/DRESSINGS) ×2 IMPLANT
DRSG MEPITEL 8X12 (GAUZE/BANDAGES/DRESSINGS) IMPLANT
ELECT REM PT RETURN 9FT ADLT (ELECTROSURGICAL) ×1
ELECTRODE REM PT RTRN 9FT ADLT (ELECTROSURGICAL) ×2 IMPLANT
GAUZE SPONGE 4X4 12PLY STRL (GAUZE/BANDAGES/DRESSINGS) ×2 IMPLANT
GLOVE BIO SURGEON STRL SZ7.5 (GLOVE) ×2 IMPLANT
GLOVE BIO SURGEON STRL SZ8 (GLOVE) ×2 IMPLANT
GLOVE BIOGEL PI IND STRL 7.5 (GLOVE) ×2 IMPLANT
GLOVE BIOGEL PI IND STRL 8 (GLOVE) ×2 IMPLANT
GLOVE SURG ORTHO LTX SZ7.5 (GLOVE) ×4 IMPLANT
GOWN STRL REUS W/ TWL LRG LVL3 (GOWN DISPOSABLE) ×4 IMPLANT
GOWN STRL REUS W/ TWL XL LVL3 (GOWN DISPOSABLE) ×2 IMPLANT
GOWN STRL REUS W/TWL LRG LVL3 (GOWN DISPOSABLE) ×2
GOWN STRL REUS W/TWL XL LVL3 (GOWN DISPOSABLE) ×1
K-WIRE 1.6 (WIRE) ×1
K-WIRE ALPS MXV 1.6X6 ZI (WIRE) ×3
K-WIRE FX150X1.6XTROC PNT (WIRE) ×1
KIT BASIN OR (CUSTOM PROCEDURE TRAY) ×2 IMPLANT
KIT TURNOVER KIT B (KITS) ×2 IMPLANT
KWIRE ALPS MXV 1.6X6 ZI (WIRE) IMPLANT
KWIRE FX150X1.6XTROC PNT (WIRE) IMPLANT
MANIFOLD NEPTUNE II (INSTRUMENTS) ×2 IMPLANT
NS IRRIG 1000ML POUR BTL (IV SOLUTION) ×2 IMPLANT
PACK ORTHO EXTREMITY (CUSTOM PROCEDURE TRAY) ×2 IMPLANT
PAD ARMBOARD 7.5X6 YLW CONV (MISCELLANEOUS) ×4 IMPLANT
PADDING CAST COTTON 6X4 STRL (CAST SUPPLIES) ×6 IMPLANT
PLATE HIGH STRENG 2.7X20 (Plate) IMPLANT
PLATE TIB EVOS 9H L 3.5X131 (Plate) IMPLANT
SCREW CORT 3.5X22 ST EVOS (Screw) IMPLANT
SCREW CORT EVOS ST 3.5X28 (Screw) IMPLANT
SCREW CORTEX 3.5X24MM (Screw) IMPLANT
SCREW CORTEX 3.5X26 (Screw) IMPLANT
SCREW CTX 3.5X34MM EVOS (Screw) IMPLANT
SCREW LOCK MDS 2.7X16 (Screw) IMPLANT
SCREW LOCK MVX 2.7X11 (Screw) IMPLANT
SCREW LOCK MVX 2.7X22 (Screw) IMPLANT
SCREW LOCK ST EVOS 3.5 X 26 (Screw) IMPLANT
SCREW LOCK ST EVOS 3.5X10 (Screw) IMPLANT
SCREW LOCK ST EVOS 3.5X24 (Screw) IMPLANT
SCREW NL 2.7X12 (Screw) IMPLANT
SCREW NLOCK 2.7X12 (Screw) ×1 IMPLANT
SCREW NLOCK MVX 2.7X16 (Screw) IMPLANT
SCREW NLOCK MVX 2.7X24 (Screw) IMPLANT
SCREW SCHNZ BLNT TRCR PT 5X200 (Screw) IMPLANT
SCREW SHANZ 5.0X170 (Screw) IMPLANT
SCRW SCHANZ BLNT TRCR PT 5X200 (Screw) ×1 IMPLANT
SPLINT PLASTER CAST FAST 5X30 (CAST SUPPLIES) IMPLANT
SPONGE T-LAP 18X18 ~~LOC~~+RFID (SPONGE) ×2 IMPLANT
STAPLER VISISTAT 35W (STAPLE) IMPLANT
SUT ETHILON 2 0 FS 18 (SUTURE) IMPLANT
SUT VIC AB 2-0 CT1 (SUTURE) IMPLANT
TOWEL GREEN STERILE (TOWEL DISPOSABLE) ×4 IMPLANT
TOWEL GREEN STERILE FF (TOWEL DISPOSABLE) ×4 IMPLANT
UNDERPAD 30X36 HEAVY ABSORB (UNDERPADS AND DIAPERS) ×2 IMPLANT
WATER STERILE IRR 1000ML POUR (IV SOLUTION) ×4 IMPLANT

## 2023-06-11 NOTE — Addendum Note (Signed)
Addendum  created 06/11/23 1451 by Samara Deist, CRNA   Intraprocedure Meds edited

## 2023-06-11 NOTE — H&P (Signed)
No significant changes since H&P on 7/20, discharge on 7/27 other than further reductions in swelling of both lower extremities.  The risks and benefits of surgery for repair of left pilon were discussed with the patient, including the possibility of infection, nerve injury, vessel injury, wound breakdown, arthritis, symptomatic hardware, DVT/ PE, loss of motion, malunion, nonunion, and need for further surgery among others. These risks were acknowledged and consent provided to proceed.  Myrene Galas, MD [facility], [facility] [phone]

## 2023-06-11 NOTE — Plan of Care (Signed)
  Problem: Consults Goal: RH GENERAL PATIENT EDUCATION Description: See Patient Education module for education specifics. Outcome: Progressing Goal: Skin Care Protocol Initiated - if Braden Score 18 or less Description: If consults are not indicated, leave blank or document N/A Outcome: Progressing   Problem: RH BOWEL ELIMINATION Goal: RH STG MANAGE BOWEL WITH ASSISTANCE Description: STG Manage Bowel with Mod I Assistance. Outcome: Progressing Goal: RH STG MANAGE BOWEL W/MEDICATION W/ASSISTANCE Description: STG Manage Bowel with Medication with Mod I Assistance. Outcome: Progressing   Problem: RH SKIN INTEGRITY Goal: RH STG MAINTAIN SKIN INTEGRITY WITH ASSISTANCE Description: STG Maintain Skin Integrity With Mod I Assistance. Outcome: Progressing Goal: RH STG ABLE TO PERFORM INCISION/WOUND CARE W/ASSISTANCE Description: STG Able To Perform Incision/Wound Care With Mod I Assistance. Outcome: Progressing   Problem: RH SAFETY Goal: RH STG ADHERE TO SAFETY PRECAUTIONS W/ASSISTANCE/DEVICE Description: STG Adhere to Safety Precautions With Mod I at wheelchair level. Outcome: Progressing   Problem: RH PAIN MANAGEMENT Goal: RH STG PAIN MANAGED AT OR BELOW PT'S PAIN GOAL Description: Less than 4 on a 0-10 p Outcome: Progressing   Problem: RH KNOWLEDGE DEFICIT GENERAL Goal: RH STG INCREASE KNOWLEDGE OF SELF CARE AFTER HOSPITALIZATION Description: Patient/caregiver will be able to manage medications and self care from nursing education and nursing handouts independently Outcome: Progressing   Problem: Education: Goal: Knowledge of General Education information will improve Description: Including pain rating scale, medication(s)/side effects and non-pharmacologic comfort measures Outcome: Progressing   Problem: Health Behavior/Discharge Planning: Goal: Ability to manage health-related needs will improve Outcome: Progressing   Problem: Clinical Measurements: Goal: Ability to  maintain clinical measurements within normal limits will improve Outcome: Progressing Goal: Will remain free from infection Outcome: Progressing Goal: Diagnostic test results will improve Outcome: Progressing Goal: Respiratory complications will improve Outcome: Progressing Goal: Cardiovascular complication will be avoided Outcome: Progressing   Problem: Activity: Goal: Risk for activity intolerance will decrease Outcome: Progressing   Problem: Nutrition: Goal: Adequate nutrition will be maintained Outcome: Progressing   Problem: Coping: Goal: Level of anxiety will decrease Outcome: Progressing   Problem: Elimination: Goal: Will not experience complications related to bowel motility Outcome: Progressing Goal: Will not experience complications related to urinary retention Outcome: Progressing   Problem: Pain Managment: Goal: General experience of comfort will improve Outcome: Progressing   Problem: Safety: Goal: Ability to remain free from injury will improve Outcome: Progressing   Problem: Skin Integrity: Goal: Risk for impaired skin integrity will decrease Outcome: Progressing
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
## 2023-06-11 NOTE — Consult Note (Signed)
06/11/2023:  Patient referred for neuropsychology consultation.  Patient was sent for surgery and unable to see.

## 2023-06-11 NOTE — Transfer of Care (Signed)
Immediate Anesthesia Transfer of Care Note  Patient: Katie [name]  Procedure(s) Performed: OPEN REDUCTION INTERNAL FIXATION (ORIF) LEFT PILON FRACTURE (Left: Ankle) REMOVAL EXTERNAL FIXATION LEG (Left: Ankle)  Patient Location: PACU  Anesthesia Type:General  Level of Consciousness: awake  Airway & Oxygen Therapy: Patient Spontanous Breathing  Post-op Assessment: Report given to RN  Post vital signs: Reviewed and stable  Last Vitals:  Vitals Value Taken Time  BP 123/66 06/11/23 1315  Temp 36.5 C 06/11/23 1310  Pulse 73 06/11/23 1317  Resp 14 06/11/23 1318  SpO2 93 % 06/11/23 1317  Vitals shown include unfiled device data.  Last Pain:  Vitals:   06/11/23 1310  TempSrc:   PainSc: 0-No pain      Patients Stated Pain Goal: 2 (06/08/23 2100)  Complications: No notable events documented.

## 2023-06-11 NOTE — Anesthesia Procedure Notes (Signed)
Procedure Name: Intubation Date/Time: 06/11/2023 8:46 AM  Performed by: Samara Deist, CRNAPre-anesthesia Checklist: Patient identified, Emergency Drugs available, Suction available and Patient being monitored Patient Re-evaluated:Patient Re-evaluated prior to induction Oxygen Delivery Method: Circle System Utilized Preoxygenation: Pre-oxygenation with 100% oxygen Induction Type: IV induction Ventilation: Mask ventilation without difficulty Laryngoscope Size: Miller and 2 Grade View: Grade I Tube type: Oral Tube size: 7.0 mm Number of attempts: 1 Airway Equipment and Method: Stylet and Oral airway Placement Confirmation: ETT inserted through vocal cords under direct vision, positive ETCO2 and breath sounds checked- equal and bilateral Secured at: 21 cm Tube secured with: Tape Dental Injury: Teeth and Oropharynx as per pre-operative assessment

## 2023-06-11 NOTE — Anesthesia Procedure Notes (Signed)
Anesthesia Regional Block: Popliteal block   Pre-Anesthetic Checklist: , timeout performed,  Correct Patient, Correct Site, Correct Laterality,  Correct Procedure, Correct Position, site marked,  Risks and benefits discussed,  Surgical consent,  Pre-op evaluation,  At surgeon's request and post-op pain management  Laterality: Left  Prep: chloraprep       Needles:  Injection technique: Single-shot  Needle Type: Echogenic Stimulator Needle     Needle Length: 5cm  Needle Gauge: 22     Additional Needles:   Procedures:, nerve stimulator,,, ultrasound used (permanent image in chart),,     Nerve Stimulator or Paresthesia:  Response: foot, 0.45 mA  Additional Responses:   Narrative:  Start time: 06/11/2023 8:35 AM End time: 06/11/2023 8:38 AM Injection made incrementally with aspirations every 5 mL.  Performed by: Personally  Anesthesiologist: Bethena Midget, MD  Additional Notes: Functioning IV was confirmed and monitors were applied.  A 50mm 22ga Arrow echogenic stimulator needle was used. Sterile prep and drape,hand hygiene and sterile gloves were used. Ultrasound guidance: relevant anatomy identified, needle position confirmed, local anesthetic spread visualized around nerve(s)., vascular puncture avoided.  Image printed for medical record. Negative aspiration and negative test dose prior to incremental administration of local anesthetic. The patient tolerated the procedure well.

## 2023-06-11 NOTE — Anesthesia Procedure Notes (Signed)
Anesthesia Regional Block: Adductor canal block   Pre-Anesthetic Checklist: , timeout performed,  Correct Patient, Correct Site, Correct Laterality,  Correct Procedure, Correct Position, site marked,  Risks and benefits discussed,  Surgical consent,  Pre-op evaluation,  At surgeon's request and post-op pain management  Laterality: Left  Prep: chloraprep       Needles:  Injection technique: Single-shot  Needle Type: Echogenic Stimulator Needle     Needle Length: 5cm  Needle Gauge: 22     Additional Needles:   Procedures:,,,, ultrasound used (permanent image in chart),,    Narrative:  Start time: 06/11/2023 8:25 AM End time: 06/11/2023 8:30 AM Injection made incrementally with aspirations every 5 mL.  Performed by: Personally  Anesthesiologist: Bethena Midget, MD  Additional Notes: Functioning IV was confirmed and monitors were applied.  A 50mm 22ga Arrow echogenic stimulator needle was used. Sterile prep and drape,hand hygiene and sterile gloves were used. Ultrasound guidance: relevant anatomy identified, needle position confirmed, local anesthetic spread visualized around nerve(s)., vascular puncture avoided.  Image printed for medical record. Negative aspiration and negative test dose prior to incremental administration of local anesthetic. The patient tolerated the procedure well.

## 2023-06-11 NOTE — Anesthesia Postprocedure Evaluation (Signed)
Anesthesia Post Note  Patient: Katie [name]  Procedure(s) Performed: OPEN REDUCTION INTERNAL FIXATION (ORIF) LEFT PILON FRACTURE (Left: Ankle) REMOVAL EXTERNAL FIXATION LEG (Left: Ankle)     Patient location during evaluation: PACU Anesthesia Type: General Level of consciousness: awake and alert Pain management: pain level controlled Vital Signs Assessment: post-procedure vital signs reviewed and stable Respiratory status: spontaneous breathing, nonlabored ventilation, respiratory function stable and patient connected to nasal cannula oxygen Cardiovascular status: blood pressure returned to baseline and stable Postop Assessment: no apparent nausea or vomiting Anesthetic complications: no  No notable events documented.  Last Vitals:  Vitals:   06/11/23 1330 06/11/23 1345  BP: 126/65 131/69  Pulse: 71 61  Resp: 14 13  Temp:  36.5 C  SpO2: 97% 96%    Last Pain:  Vitals:   06/11/23 1330  TempSrc:   PainSc: 0-No pain                 ,

## 2023-06-11 NOTE — Plan of Care (Signed)
  Problem: Consults Goal: RH GENERAL PATIENT EDUCATION Description: See Patient Education module for education specifics. Outcome: Progressing Goal: Skin Care Protocol Initiated - if Braden Score 18 or less Description: If consults are not indicated, leave blank or document N/A Outcome: Progressing   Problem: RH BOWEL ELIMINATION Goal: RH STG MANAGE BOWEL WITH ASSISTANCE Description: STG Manage Bowel with Mod I Assistance. Outcome: Progressing Goal: RH STG MANAGE BOWEL W/MEDICATION W/ASSISTANCE Description: STG Manage Bowel with Medication with Mod I Assistance. Outcome: Progressing   Problem: RH SKIN INTEGRITY Goal: RH STG MAINTAIN SKIN INTEGRITY WITH ASSISTANCE Description: STG Maintain Skin Integrity With Mod I Assistance. Outcome: Progressing Goal: RH STG ABLE TO PERFORM INCISION/WOUND CARE W/ASSISTANCE Description: STG Able To Perform Incision/Wound Care With Mod I Assistance. Outcome: Progressing   Problem: RH SAFETY Goal: RH STG ADHERE TO SAFETY PRECAUTIONS W/ASSISTANCE/DEVICE Description: STG Adhere to Safety Precautions With Mod I at wheelchair level. Outcome: Progressing   Problem: RH PAIN MANAGEMENT Goal: RH STG PAIN MANAGED AT OR BELOW PT'S PAIN GOAL Description: Less than 4 on a 0-10 p Outcome: Progressing

## 2023-06-11 NOTE — Progress Notes (Signed)
Patient transport came to pick patient up for procedure. Explained to transport that patient consent was not signed due to patient stating that no one came to explain procedure to patient and did not feel right signing consent. Consent in chart awaiting signature. Cletis Media, RN

## 2023-06-12 DIAGNOSIS — S82892D Other fracture of left lower leg, subsequent encounter for closed fracture with routine healing: Secondary | ICD-10-CM

## 2023-06-12 DIAGNOSIS — S82891D Other fracture of right lower leg, subsequent encounter for closed fracture with routine healing: Secondary | ICD-10-CM

## 2023-06-12 MED ORDER — VITAMIN D 25 MCG (1000 UNIT) PO TABS
2000.0000 [IU] | ORAL_TABLET | Freq: Two times a day (BID) | ORAL | Status: DC
  Administered 2023-06-12 – 2023-06-15 (×7): 2000 [IU] via ORAL
  Filled 2023-06-12 (×7): qty 2

## 2023-06-12 MED ORDER — NALOXONE HCL 0.4 MG/ML IJ SOLN: 0.4000 mg | INTRAMUSCULAR | Status: DC | PRN

## 2023-06-12 MED ORDER — CYCLOBENZAPRINE HCL 5 MG PO TABS
5.0000 mg | ORAL_TABLET | Freq: Three times a day (TID) | ORAL | Status: DC | PRN
  Administered 2023-06-12 – 2023-06-15 (×3): 5 mg via ORAL
  Filled 2023-06-12 (×4): qty 1

## 2023-06-12 MED ORDER — GABAPENTIN 300 MG PO CAPS
600.0000 mg | ORAL_CAPSULE | Freq: Three times a day (TID) | ORAL | Status: DC
  Administered 2023-06-12 (×2): 600 mg via ORAL
  Filled 2023-06-12 (×3): qty 2

## 2023-06-12 NOTE — Progress Notes (Signed)
Occupational Therapy Note  Patient Details  Name: Katie [name] MRN: [id number] Date of Birth: 1966-08-28  Today's Date: 06/12/2023 OT Missed Time: 60 Minutes Missed Time Reason: Pain  Pt greeted supine in bed reporting increased pain in BLEs since surgery, pt in tears declining therapy at this time. Will f/u as time allows for OT session to make up for missed minutes.    Pollyann Glen [facility] 06/12/2023, 8:26 AM
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
## 2023-06-12 NOTE — Progress Notes (Signed)
Physical Therapy Note  Patient Details  Name: Katie [name] MRN: [id number] Date of Birth: [birth year] Today's Date: 06/12/2023    Therapist attempt to see pt for make up minutes at 1355, pt groggy and minimally responsive due to sleeping. Unable to participate with therapy at this time, will f/u as time allow.  Edwin Cap 06/12/2023, 2:00 PM

## 2023-06-12 NOTE — Progress Notes (Signed)
Occupational Therapy Session Note  Patient Details  Name: Katie [name] MRN: [id number] Date of Birth: 1966-10-30  Today's Date: 06/12/2023 OT Missed Time: 45 Minutes Missed Time Reason: Patient fatigue;Patient ill (comment) (significnat pain in B LEs and drowsniness d/t pain)   Short Term Goals: Week 2:  OT Short Term Goal 1 (Week 2): STGs=LTGs due to patient's length of stay.  Skilled Therapeutic Interventions/Progress Updates:     Attempted to see Pt for skilled OT session, however Pt in pain and drowsy from pain medications politely declining therapy session. Provided maximal therapeutic support, however Pt continued to decline session. Pt was left resting in bed with call bell in reach, bed alarm on, and all needs met. Will attempt to make up time as Pt's status and schedule allows.   Therapy Documentation Precautions:  Precautions Precautions: Fall Required Braces or Orthoses: Spinal Brace Spinal Brace: Thoracolumbosacral orthotic Spinal Brace Comments: Apply in sitting. Restrictions Weight Bearing Restrictions: Yes RLE Weight Bearing: Non weight bearing LLE Weight Bearing: Non weight bearing   Therapy/Group: Individual Therapy  Army Fossa 06/12/2023, 8:04 AM
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
## 2023-06-12 NOTE — Progress Notes (Signed)
Checked on patient.  Very tearful and upset, reports that has not sleep at all due to pain.  Has already had pain medicine this morning and it's not working.  Reports that was not in this much pain before.  Will let MD know.  Review medications and has IV Dilaudid at 0625, valium given at 0753.  Will see if RN can give oxycodone.
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
## 2023-06-13 ENCOUNTER — Inpatient Hospital Stay (HOSPITAL_COMMUNITY): Payer: Commercial Managed Care - HMO

## 2023-06-13 MED ORDER — GABAPENTIN 300 MG PO CAPS
300.0000 mg | ORAL_CAPSULE | Freq: Three times a day (TID) | ORAL | Status: DC
  Administered 2023-06-13 – 2023-06-15 (×5): 300 mg via ORAL
  Filled 2023-06-13 (×5): qty 1

## 2023-06-13 MED ORDER — GABAPENTIN 300 MG PO CAPS
300.0000 mg | ORAL_CAPSULE | Freq: Once | ORAL | Status: AC
  Administered 2023-06-13: 300 mg via ORAL
  Filled 2023-06-13: qty 1

## 2023-06-13 MED ORDER — RIVAROXABAN 20 MG PO TABS
20.0000 mg | ORAL_TABLET | Freq: Every day | ORAL | Status: DC
  Administered 2023-06-13 – 2023-06-14 (×2): 20 mg via ORAL
  Filled 2023-06-13 (×3): qty 1

## 2023-06-13 NOTE — Progress Notes (Signed)
Physical Therapy Session Note  Patient Details  Name: Katie [name] MRN: [id number] Date of Birth: Dec 22, 1965  Today's Date: 06/13/2023 PT Individual Time: 1100-1203 PT Individual Time Calculation (min): 63 min   Short Term Goals: Week 1:  PT Short Term Goal 1 (Week 1): Pt will initiate car trasnfer PT Short Term Goal 2 (Week 1): Pt will propel WC in narrow spaces to simulate home enviornment with supervision PT Short Term Goal 3 (Week 1): Pt will perform bed<>chair transfers with supervision  Skilled Therapeutic Interventions/Progress Updates:  Patient seated upright in w/c on entrance to room with a smile on her face. Patient alert and agreeable to PT session.   At start of session, pt with significant improvement in pain from previous day. Relates no back or RLE pain and very minimal pain in LLE d/t pain medication.   Relates worry with diuretic and ability to transfer across bed and to [facility] in time. But is trying to remind staff to keep pt's urinal within arm's reach.   Wheelchair Mobility:  Pt propelled wheelchair from room to main therapy gym with supervision and assist only provided when floor uneven and wheel of wheelchair not in contact with floor. Is able to propel distance and maneuver all corners and turns with distant supervision. Maneuvers brake handles appropriately and leg rests moved under LEs with ease. No cueing required.   Therapeutic Exercise: Pt relates stiffness in L knee d/t time in bed over last few days. Focus of remainder of session spent on active and passive ranging of BLE with focus on strengthening hip and knee musculature for eventual ambulation.   Pt performed the following seated BLE exercises with vc/ tc for proper technique. 3x10 LAQs 1x10 SLR from seated position 2x10 hip abduction with red t-band 2x10 hip adduction against ball with 5sec holds 1x10 toe flex/ ext  Patient seated upright in w/c at end of session with brakes locked, belt alarm set, and  all needs within reach. Related that Pt would assist pt with transfer to [facility] on return for next session after lunch. Pt appreciative.    Therapy Documentation Precautions:  Precautions Precautions: Fall Required Braces or Orthoses: Spinal Brace Spinal Brace: Thoracolumbosacral orthotic Spinal Brace Comments: Apply in sitting. Restrictions Weight Bearing Restrictions: Yes RLE Weight Bearing: Non weight bearing (BLE) LLE Weight Bearing: Non weight bearing General:   Vital Signs: Therapy Vitals Pulse Rate: 71 Oxygen Therapy SpO2: 95 % O2 Device: Room Air O2 Flow Rate (L/min): 2 L/min Patient Activity (if Appropriate): In bed Pulse Oximetry Type: Continuous Pain: Pain Assessment Pain Scale: 0-10 Pain Score: 7  Pain Type: Acute pain;Surgical pain Pain Location: Leg Pain Orientation: Right;Left Pain Descriptors / Indicators: Aching;Throbbing Pain Frequency: Constant Pain Onset: On-going Patients Stated Pain Goal: 2 Pain Intervention(s): Medication (See eMAR)  Therapy/Group: Individual Therapy  Loel Dubonnet PT, DPT, CSRS 06/13/2023, 10:57 AM

## 2023-06-13 NOTE — Progress Notes (Signed)
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   Ulyses Southward, PharmD, BCIDP, AAHIVP, CPP Infectious Disease Pharmacist 06/13/2023 8:42 AM

## 2023-06-13 NOTE — Progress Notes (Incomplete)
Physical Therapy Weekly Progress Note  Patient Details  Name: Katie [name] MRN: [id number] Date of Birth: 10/09/1966  Beginning of progress report period: June 03, 2023 End of progress report period: June 13, 2023  {CHL IP REHAB PT TIME CALCULATION:304800500}  Patient has met {number 1-5:22450} of {number 1-5:20334} short term goals.  ***  Patient continues to demonstrate the following deficits {impairments:3041632} and therefore will continue to benefit from skilled PT intervention to increase functional independence with mobility.  Patient {LTG progression:3041653}.  {plan of GMWN:0272536}  PT Short Term Goals {UYQ:0347425}  Skilled Therapeutic Interventions/Progress Updates:      Therapy Documentation Precautions:  Precautions Precautions: Fall Required Braces or Orthoses: Spinal Brace Spinal Brace: Thoracolumbosacral orthotic Spinal Brace Comments: Apply in sitting. Restrictions Weight Bearing Restrictions: Yes RLE Weight Bearing: Non weight bearing (BLE) LLE Weight Bearing: Non weight bearing General:   Vital Signs: Therapy Vitals Pulse Rate: 71 Oxygen Therapy SpO2: 95 % O2 Device: Room Air O2 Flow Rate (L/min): 2 L/min Patient Activity (if Appropriate): In bed Pulse Oximetry Type: Continuous Pain: Pain Assessment Pain Scale: 0-10 Pain Score: 7  Pain Type: Acute pain;Surgical pain Pain Location: Leg Pain Orientation: Right;Left Pain Descriptors / Indicators: Aching;Throbbing Pain Frequency: Constant Pain Onset: On-going Patients Stated Pain Goal: 2 Pain Intervention(s): Medication (See eMAR) Vision/Perception     Mobility:   Locomotion :    Trunk/Postural Assessment :    Balance:   Exercises:   Other Treatments:     Therapy/Group: Individual Therapy  Loel Dubonnet 06/13/2023, 10:58 AM
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
## 2023-06-13 NOTE — Progress Notes (Signed)
Patient ID: Katie [name], female   DOB: 1966/02/10, 57 y.o.   MRN: [id number]  SW scheduled ambulance transport via [facility] for pick up at 1030am to discharge to home.  Cecile Sheerer, MSW, LCSWA Office: [phone] Cell: [phone] Fax: [phone]

## 2023-06-13 NOTE — Plan of Care (Signed)
  Problem: Consults Goal: RH GENERAL PATIENT EDUCATION Description: See Patient Education module for education specifics. Outcome: Progressing Goal: Skin Care Protocol Initiated - if Braden Score 18 or less Description: If consults are not indicated, leave blank or document N/A Outcome: Progressing   Problem: RH BOWEL ELIMINATION Goal: RH STG MANAGE BOWEL WITH ASSISTANCE Description: STG Manage Bowel with Mod I Assistance. Outcome: Progressing Goal: RH STG MANAGE BOWEL W/MEDICATION W/ASSISTANCE Description: STG Manage Bowel with Medication with Mod I Assistance. Outcome: Progressing   Problem: RH SKIN INTEGRITY Goal: RH STG MAINTAIN SKIN INTEGRITY WITH ASSISTANCE Description: STG Maintain Skin Integrity With Mod I Assistance. Outcome: Progressing

## 2023-06-13 NOTE — Progress Notes (Signed)
Physical Therapy Session Note  Patient Details  Name: Katie [name] MRN: [id number] Date of Birth: 11/05/1966  Today's Date: 06/13/2023 PT Individual Time: 0916-1011 PT Individual Time Calculation (min): 55 min   Short Term Goals: Week 1:  PT Short Term Goal 1 (Week 1): Pt will initiate car trasnfer PT Short Term Goal 2 (Week 1): Pt will propel WC in narrow spaces to simulate home enviornment with supervision PT Short Term Goal 3 (Week 1): Pt will perform bed<>chair transfers with supervision  Skilled Therapeutic Interventions/Progress Updates:    Chart reviewed and pt agreeable to therapy. Pt received semi-reclined in bed with 4/10 c/o pain in L knee. Session focused on functional transfers and WC mobility to promote safe home access. Pt initiated session with rolls and turning in bed for LB dressing. Pt required set up assist and displayed good ability to instruct a caregiver in support needs. Pt noted to need increased time 2/2 large casts and fatigue with dressing. Pt then completed slide transfer to [facility]  using close S + slideboard. Pt again displayed good communication with caregiver for set up support. Pt also demonstrated good ability for WC set up and management. Pt then completed WC propulsion of 4100ft with 3 rest breaks using S. Session education emphasized communication techniques with caregivers and safe positioning of trunk during slide board transfers. At end of session, pt was left seated in WC. Pt safety plan updated to pt not requiring alarm in chair, so pt left with nurse call bell and all needs in reach.     Therapy Documentation Precautions:  Precautions Precautions: Fall Required Braces or Orthoses: Spinal Brace Spinal Brace: Thoracolumbosacral orthotic Spinal Brace Comments: Apply in sitting. Restrictions Weight Bearing Restrictions: Yes RLE Weight Bearing: Non weight bearing (BLE) LLE Weight Bearing: Non weight bearing General:      Therapy/Group: Individual  Therapy  Dionne Milo, PT, DPT 06/13/2023, 11:14 AM

## 2023-06-14 ENCOUNTER — Other Ambulatory Visit (HOSPITAL_COMMUNITY): Payer: Self-pay

## 2023-06-14 ENCOUNTER — Encounter (HOSPITAL_COMMUNITY): Payer: Self-pay | Admitting: Orthopedic Surgery

## 2023-06-14 LAB — CBC WITH DIFFERENTIAL/PLATELET
Abs Immature Granulocytes: 0.05 10*3/uL (ref 0.00–0.07)
Basophils Absolute: 0 10*3/uL (ref 0.0–0.1)
Basophils Relative: 0 %
Eosinophils Absolute: 0.2 10*3/uL (ref 0.0–0.5)
Eosinophils Relative: 2 %
HCT: 29.8 % — ABNORMAL LOW (ref 36.0–46.0)
Hemoglobin: 9.7 g/dL — ABNORMAL LOW (ref 12.0–15.0)
Immature Granulocytes: 1 %
Lymphocytes Relative: 25 %
Lymphs Abs: 2.3 10*3/uL (ref 0.7–4.0)
MCH: 30.8 pg (ref 26.0–34.0)
MCHC: 32.6 g/dL (ref 30.0–36.0)
MCV: 94.6 fL (ref 80.0–100.0)
Monocytes Absolute: 0.9 10*3/uL (ref 0.1–1.0)
Monocytes Relative: 10 %
Neutro Abs: 5.7 10*3/uL (ref 1.7–7.7)
Neutrophils Relative %: 62 %
Platelets: 506 10*3/uL — ABNORMAL HIGH (ref 150–400)
RBC: 3.15 MIL/uL — ABNORMAL LOW (ref 3.87–5.11)
RDW: 12.3 % (ref 11.5–15.5)
WBC: 9.1 10*3/uL (ref 4.0–10.5)
nRBC: 0 % (ref 0.0–0.2)

## 2023-06-14 LAB — BASIC METABOLIC PANEL
Anion gap: 11 (ref 5–15)
BUN: <5 mg/dL — ABNORMAL LOW (ref 6–20)
CO2: 29 mmol/L (ref 22–32)
Calcium: 8.9 mg/dL (ref 8.9–10.3)
Chloride: 93 mmol/L — ABNORMAL LOW (ref 98–111)
Creatinine, Ser: 0.71 mg/dL (ref 0.44–1.00)
GFR, Estimated: >60 mL/min (ref >60)
Glucose, Bld: 99 mg/dL (ref 70–99)
Potassium: 3.6 mmol/L (ref 3.5–5.1)
Sodium: 133 mmol/L — ABNORMAL LOW (ref 135–145)

## 2023-06-14 MED ORDER — MAGNESIUM OXIDE 400 MG PO TABS
250.0000 mg | ORAL_TABLET | Freq: Every day | ORAL | 0 refills | Status: AC
  Filled 2023-06-14: qty 120, 240d supply, fill #0

## 2023-06-14 MED ORDER — VITAMIN D3 25 MCG PO TABS
2000.0000 [IU] | ORAL_TABLET | Freq: Two times a day (BID) | ORAL | 0 refills | Status: AC
  Filled 2023-06-14: qty 60, 15d supply, fill #0

## 2023-06-14 MED ORDER — FUROSEMIDE 40 MG PO TABS
40.0000 mg | ORAL_TABLET | Freq: Every day | ORAL | 0 refills | Status: AC
  Filled 2023-06-14: qty 30, 30d supply, fill #0

## 2023-06-14 MED ORDER — POTASSIUM CHLORIDE CRYS ER 20 MEQ PO TBCR
20.0000 meq | EXTENDED_RELEASE_TABLET | Freq: Two times a day (BID) | ORAL | 0 refills | Status: AC
  Filled 2023-06-14: qty 60, 30d supply, fill #0

## 2023-06-14 MED ORDER — MELATONIN 5 MG PO TABS
5.0000 mg | ORAL_TABLET | Freq: Every evening | ORAL | 0 refills | Status: AC | PRN
  Filled 2023-06-14: qty 30, 30d supply, fill #0

## 2023-06-14 MED ORDER — DIAZEPAM 10 MG PO TABS: 5.0000 mg | ORAL_TABLET | Freq: Two times a day (BID) | ORAL | Status: AC | PRN

## 2023-06-14 MED ORDER — RIVAROXABAN 20 MG PO TABS
20.0000 mg | ORAL_TABLET | Freq: Every day | ORAL | 0 refills | Status: AC
  Filled 2023-06-14: qty 30, 30d supply, fill #0

## 2023-06-14 MED ORDER — NALOXONE HCL 4 MG/0.1ML NA LIQD
1.0000 | NASAL | 0 refills | Status: AC | PRN
  Filled 2023-06-14: qty 2, 5d supply, fill #0

## 2023-06-14 MED ORDER — LIDOCAINE 5 % EX PTCH
2.0000 | MEDICATED_PATCH | CUTANEOUS | 0 refills | Status: AC
  Filled 2023-06-14: qty 60, 30d supply, fill #0

## 2023-06-14 MED ORDER — METHOCARBAMOL 750 MG PO TABS
750.0000 mg | ORAL_TABLET | Freq: Four times a day (QID) | ORAL | 0 refills | Status: AC
  Filled 2023-06-14: qty 120, 30d supply, fill #0

## 2023-06-14 MED ORDER — OXYCODONE HCL 5 MG PO TABS
5.0000 mg | ORAL_TABLET | ORAL | 0 refills | Status: AC | PRN
  Filled 2023-06-14: qty 35, 3d supply, fill #0

## 2023-06-14 MED ORDER — CYCLOBENZAPRINE HCL 5 MG PO TABS
5.0000 mg | ORAL_TABLET | Freq: Three times a day (TID) | ORAL | 0 refills | Status: AC | PRN
  Filled 2023-06-14: qty 45, 15d supply, fill #0

## 2023-06-14 MED ORDER — GABAPENTIN 300 MG PO CAPS
300.0000 mg | ORAL_CAPSULE | Freq: Three times a day (TID) | ORAL | 0 refills | Status: AC
  Filled 2023-06-14: qty 90, 30d supply, fill #0

## 2023-06-14 MED ORDER — POLYETHYLENE GLYCOL 3350 17 GM/SCOOP PO POWD
17.0000 g | Freq: Two times a day (BID) | ORAL | 0 refills | Status: AC
  Filled 2023-06-14: qty 238, 7d supply, fill #0

## 2023-06-14 MED ORDER — TRAMADOL HCL 50 MG PO TABS
50.0000 mg | ORAL_TABLET | Freq: Four times a day (QID) | ORAL | 0 refills | Status: AC
Start: 2023-06-14 — End: ?
  Filled 2023-06-14: qty 28, 7d supply, fill #0

## 2023-06-14 NOTE — Progress Notes (Signed)
Occupational Therapy Discharge Summary  Patient Details  Name: XIARA BRADT MRN: [id number] Date of Birth: 1965-12-04  Date of Discharge from OT service:May 14, 2023  Today's Date: 06/14/2023 OT Individual Time: 0845-1000  OT Individual Time Calculation (min): 75 min    Patient has met 5 of 6 long term goals due to improved activity tolerance, improved balance, ability to compensate for deficits, and improved coordination.  Patient to discharge at overall Modified Independent level.  Patient's care partner is independent to provide the necessary physical assistance at discharge.    Reasons goals not met: Tub/shower transfer goal not applicable to patients needs a this time.  Pt able to complete self care at sink level.  BLE with cast.  Recommendation:  Patient will benefit from ongoing skilled OT services in home health setting to continue to advance functional skills in the area of BADL.  Equipment: DABSC, Wheelchair, and hospital bed  Reasons for discharge: treatment goals met and discharge from hospital  Patient/family agrees with progress made and goals achieved: Yes   OT Discharge Precautions/Restrictions  Precautions Precautions: Fall Required Braces or Orthoses: Spinal Brace Spinal Brace: Thoracolumbosacral orthotic Spinal Brace Comments: does not need to wear TLSO until weight bearing precautions are lifted Restrictions Weight Bearing Restrictions: Yes RLE Weight Bearing: Non weight bearing LLE Weight Bearing: Non weight bearing General   Vital Signs   Pain Pain Assessment Pain Scale: 0-10 Pain Score: 0-No pain ADL ADL Eating: Independent Where Assessed-Eating: Bed level Grooming: Modified independent Where Assessed-Grooming: Sitting at sink Upper Body Bathing: Modified independent Where Assessed-Upper Body Bathing: Sitting at sink Lower Body Bathing: Supervision/safety Where Assessed-Lower Body Bathing: Bed level Upper Body Dressing: Modified independent  (Device) Where Assessed-Upper Body Dressing: Bed level Lower Body Dressing: Supervision/safety Where Assessed-Lower Body Dressing: Bed level Toileting: Supervision/safety Where Assessed-Toileting: Bedside Commode Toilet Transfer: Close supervision Toilet Transfer Method: Other (comment) (Anterior Posterior transfer) Toilet Transfer Equipment: Extra wide drop arm bedside commode Tub/Shower Transfer: Unable to assess Tub/Shower Transfer Method: Unable to assess Film/video editor: Unable to assess Film/video editor Method: Unable to assess Vision Baseline Vision/History: 1 Wears glasses (readers) Patient Visual Report: No change from baseline Vision Assessment?: No apparent visual deficits Perception  Perception: Within Functional Limits Praxis Praxis: WFL Cognition Cognition Overall Cognitive Status: Within Functional Limits for tasks assessed Arousal/Alertness: Awake/alert Orientation Level: Person;Place;Situation Person: Oriented Place: Oriented Situation: Oriented Memory: Appears intact Awareness: Appears intact Problem Solving: Appears intact Safety/Judgment: Appears intact Brief Interview for Mental Status (BIMS) Repetition of Three Words (First Attempt): 3 Temporal Orientation: Year: Correct Temporal Orientation: Month: Accurate within 5 days Temporal Orientation: Day: Correct Recall: "Sock": Yes, after cueing ("something to wear") Recall: "Blue": Yes, no cue required Recall: "Bed": Yes, no cue required BIMS Summary Score: 14 Sensation Sensation Light Touch: Impaired Detail Peripheral sensation comments: Tingling/throbbing on B feet, L LE> R LE, pt reports dorsal aspect of L LE up to shin, and tingling on toes only Hot/Cold: Appears Intact Proprioception: Appears Intact Coordination Gross Motor Movements are Fluid and Coordinated: No Fine Motor Movements are Fluid and Coordinated: Yes Coordination and Movement Description: Limited due to NWB on  BLEs Motor  Motor Motor: Within Functional Limits Mobility  Bed Mobility Bed Mobility: Supine to Sit;Sit to Supine Supine to Sit: Independent with assistive device Sit to Supine: Independent with assistive device  Trunk/Postural Assessment  Cervical Assessment Cervical Assessment: Within Functional Limits Thoracic Assessment Thoracic Assessment: Within Functional Limits Lumbar Assessment Lumbar Assessment: Within Functional Limits Postural Control  Postural Control: Within Functional Limits  Balance Balance Balance Assessed: Yes Static Sitting Balance Static Sitting - Balance Support: Feet supported;Bilateral upper extremity supported Static Sitting - Level of Assistance: 6: Modified independent (Device/Increase time) Dynamic Sitting Balance Dynamic Sitting - Balance Support: Feet supported;During functional activity Dynamic Sitting - Level of Assistance: 6: Modified independent (Device/Increase time) Dynamic Sitting - Balance Activities: Lateral lean/weight shifting;Forward lean/weight shifting Extremity/Trunk Assessment RUE Assessment RUE Assessment: Within Functional Limits LUE Assessment LUE Assessment: Within Functional Limits  OT treatment session: Pt received lying supine in bed with HOB elevated.  Pt was in good spirits with bright affect.  Discussed equipment needed at discharge and pt stated she would need a DABSC, hospital bed and wheelchair.  Message sent to SW.  Pt and OTS discussed ramp for ability to enter and exit her home.  Pt stated ramp would probably come at a later date.  Pt stated that she was confident in returning home and understands that with the ramp not being in place it could pose a safety risk for exiting the home in the event of an emergency. Pt needed to void and was able to complete AP transfer to [facility] with set-up A with placement of bedside commode. Peri-care completed with Mod I while seated on BSC.  Pt able to complete UB/LB dressing while seated  on BSC with Set-up A for retrieving items of clothing. Pt able to complete bed mobility with Mod I scooting from one side of the bed to the other for transfers.  Pt wished to complete self care at the sink and was able to complete slide board transfer with (S) for wheelchair stability during transfer.  Wheelchair propulsion ~71ft to sink with independence.  Pt able to complete  self-care at sink level with Set-Up A. Wheelchair propulsion to gym ~52ft independently.  Pt educated on pressure relieving strategies and was able to return demonstrate 3x10 wheelchair push ups and lateral leans.  Wheelchair propulsion back to room ~23ft with independence.  Pt back in room with call light within reach, chair alarm set and all needs met. Liam Graham 06/14/2023, 12:27 PM

## 2023-06-14 NOTE — Progress Notes (Signed)
Physical Therapy Session Note  Patient Details  Name: Katie [name] MRN: [id number] Date of Birth: Dec 03, 1965  Today's Date: 06/14/2023 PT Individual Time: 1300-1344 PT Individual Time Calculation (min): 44 min   Short Term Goals: Week 2:  PT Short Term Goal 1 (Week 2): STG = LTG d/t ELOS  Skilled Therapeutic Interventions/Progress Updates:    Pt presents in room in bed and agreeable to PT. Pt denies pain at this time. Session focused on transfer training and education on safety at DC.  Pt completes bed mobility modI, completes slideboard transfer modI, therapist stabilizing WC only due to being on slick surface. Pt able to manage all WC parts modI. Pt self propels WC to main gym modI with increased time. Pt educated on transport to/from home since pt will not have ramp at DC. Pt confirms she will have stretcher transport home tomorrow and will use medical transport for appointments until ramp is built. Discussion completed around how pt is going to complete car transfers once she has ramp if still NWB. Verbal description provided then pt transported to ortho gym dependently for energy conservation to trial car transfer. Pt completes AP transfer from [facility], mimicking true door opening width, slideboard placed for safety pt requires CGA for transfer.  Pt self propels back to room modI and completes slideboard transfer back to bed managing all parts independently. Pt remains in bed with all needs within reach, call light in place, and bed alarm activated at end of session.  Therapy Documentation Precautions:  Precautions Precautions: Fall Required Braces or Orthoses: Spinal Brace Spinal Brace: Thoracolumbosacral orthotic Spinal Brace Comments: does not need to wear TLSO until weight bearing precautions are lifted Restrictions Weight Bearing Restrictions: Yes RLE Weight Bearing: Non weight bearing LLE Weight Bearing: Non weight bearing    Therapy/Group: Individual Therapy  Edwin Cap  PT, DPT 06/14/2023, 1:47 PM
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
## 2023-06-14 NOTE — Plan of Care (Cosign Needed)
  Problem: RH Balance Goal: LTG: Patient will maintain dynamic sitting balance (OT) Description: LTG:  Patient will maintain dynamic sitting balance with assistance during activities of daily living (OT) Outcome: Completed/Met   Problem: RH Bathing Goal: LTG Patient will bathe all body parts with assist levels (OT) Description: LTG: Patient will bathe all body parts with assist levels (OT) Outcome: Completed/Met   Problem: RH Dressing Goal: LTG Patient will perform lower body dressing w/assist (OT) Description: LTG: Patient will perform lower body dressing with assist, with/without cues in positioning using equipment (OT) Outcome: Completed/Met   Problem: RH Toileting Goal: LTG Patient will perform toileting task (3/3 steps) with assistance level (OT) Description: LTG: Patient will perform toileting task (3/3 steps) with assistance level (OT)  Outcome: Completed/Met   Problem: RH Toilet Transfers Goal: LTG Patient will perform toilet transfers w/assist (OT) Description: LTG: Patient will perform toilet transfers with assist, with/without cues using equipment (OT) Outcome: Adequate for Discharge   Problem: RH Tub/Shower Transfers Goal: LTG Patient will perform tub/shower transfers w/assist (OT) Description: LTG: Patient will perform tub/shower transfers with assist, with/without cues using equipment (OT) Outcome: Not Applicable

## 2023-06-14 NOTE — Plan of Care (Signed)
  Problem: RH Balance Goal: LTG Patient will maintain dynamic sitting balance (PT) Description: LTG:  Patient will maintain dynamic sitting balance with assistance during mobility activities (PT) Outcome: Completed/Met   Problem: RH Bed Mobility Goal: LTG Patient will perform bed mobility with assist (PT) Description: LTG: Patient will perform bed mobility with assistance, with/without cues (PT). Outcome: Completed/Met   Problem: RH Bed to Chair Transfers Goal: LTG Patient will perform bed/chair transfers w/assist (PT) Description: LTG: Patient will perform bed to chair transfers with assistance (PT). Outcome: Completed/Met   Problem: RH Car Transfers Goal: LTG Patient will perform car transfers with assist (PT) Description: LTG: Patient will perform car transfers with assistance (PT). Outcome: Completed/Met   Problem: RH Wheelchair Mobility Goal: LTG Patient will propel w/c in controlled environment (PT) Description: LTG: Patient will propel wheelchair in controlled environment, # of feet with assist (PT) Outcome: Completed/Met

## 2023-06-14 NOTE — Progress Notes (Signed)
Orthopedic Tech Progress Note Patient Details:  Katie [name] [birth year] [id number]  Two CAM boots were delivered to the pt's room. Pt understands she needs to bring them with her to her first office visit.  Ortho Devices Type of Ortho Device: CAM walker Ortho Device/Splint Location: for BLE, at bedside Ortho Device/Splint Interventions: Ordered      Docia Furl 06/14/2023, 7:08 PM

## 2023-06-14 NOTE — Progress Notes (Addendum)
Patient ID: Katie [name], female   DOB: 05/15/1966, 57 y.o.   MRN: [id number]   SW ordered DME: DABSC, hospital bed, w/c, and transfer board with [employer] via parachute.   *SW met with pt during PT session. She reports bed should be delivered today. SW discussed someone picking up her DME that will come to room. She states she would like to use [facility] pharmacy at discharge.   Cecile Sheerer, MSW, LCSWA Office: [phone] Cell: [phone] Fax: [phone]

## 2023-06-14 NOTE — Progress Notes (Signed)
Physical Therapy Discharge Summary  Patient Details  Name: Katie [name] MRN: [id number] Date of Birth: 06-01-1966  Date of Discharge from PT service:June 14, 2023  Today's Date: 06/14/2023 PT Individual Time: 1000-1058 PT Individual Time Calculation (min): 58 min    Patient has met 6 of 6 long term goals due to improved activity tolerance, improved balance, improved postural control, increased strength, increased range of motion, decreased pain, ability to compensate for deficits, improved attention, improved awareness, and improved coordination.  Patient to discharge at a wheelchair level Modified Independent.   Patient's care partner is independent to provide the necessary physical assistance at discharge.    Recommendation:  Patient will benefit from ongoing skilled PT services in outpatient setting to continue to advance safe functional mobility, address ongoing impairments in strength, ROM, balance, gait, and minimize fall risk.  Equipment: Hospital bed, wheelchair, drop down commode, slide board, recommended ramp for home entry   Reasons for discharge: treatment goals met and discharge from hospital  Patient/family agrees with progress made and goals achieved: Yes  PT Discharge Precautions/Restrictions Precautions Precautions: Fall Required Braces or Orthoses: Spinal Brace Spinal Brace: Thoracolumbosacral orthotic Spinal Brace Comments: does not need to wear TLSO until weight bearing precautions are lifted Restrictions Weight Bearing Restrictions: Yes RLE Weight Bearing: Non weight bearing LLE Weight Bearing: Non weight bearing Pain Interference Pain Interference Pain Effect on Sleep: 1. Rarely or not at all Pain Interference with Therapy Activities: 1. Rarely or not at all Pain Interference with Day-to-Day Activities: 1. Rarely or not at all Vision/Perception  Vision - History Ability to See in Adequate Light: 0 Adequate Perception Perception: Within Functional  Limits Praxis Praxis: WFL  Cognition Overall Cognitive Status: Within Functional Limits for tasks assessed Arousal/Alertness: Awake/alert Orientation Level: Oriented X4 Memory: Appears intact Awareness: Appears intact Problem Solving: Appears intact Safety/Judgment: Appears intact Sensation Sensation Light Touch: Impaired Detail Peripheral sensation comments: Tingling/throbbing on B feet, L LE> R LE, pt reports dorsal aspect of L LE up to shin, and tingling on toes only Hot/Cold: Appears Intact Proprioception: Appears Intact Coordination Gross Motor Movements are Fluid and Coordinated: No Fine Motor Movements are Fluid and Coordinated: Yes Coordination and Movement Description: Limited due to NWB on BLEs Motor  Motor Motor: Within Functional Limits  Mobility Bed Mobility Bed Mobility: Supine to Sit;Sit to Supine Supine to Sit: Independent with assistive device Sit to Supine: Independent with assistive device Transfers Transfers: Chief Technology Officer Transfer: Set up assist Lateral/Scoot Transfers: Independent with assistive device Locomotion  Gait Ambulation: No Gait Gait: No Stairs / Additional Locomotion Stairs: No Pick up small object from the floor (from standing position) activity did not occur: Safety/medical Energy manager: Yes Wheelchair Assistance: Independent with Scientist, research (life sciences): Both upper extremities Wheelchair Parts Management: Independent (pt places legs rests on chair, for maintenance of spinal precautions, and not bending to pick them up off of floor.)  Trunk/Postural Assessment  Cervical Assessment Cervical Assessment: Within Functional Limits Thoracic Assessment Thoracic Assessment: Within Functional Limits Lumbar Assessment Lumbar Assessment: Within Functional Limits Postural Control Postural Control: Within Functional Limits   Balance Balance Balance Assessed: Yes Static Sitting Balance Static Sitting - Balance Support: Feet supported;Bilateral upper extremity supported Static Sitting - Level of Assistance: 6: Modified independent (Device/Increase time) Dynamic Sitting Balance Dynamic Sitting - Balance Support: Feet supported;During functional activity Dynamic Sitting - Level of Assistance: 6: Modified independent (Device/Increase time) Dynamic Sitting - Balance Activities: Lateral lean/weight shifting;Forward lean/weight shifting Extremity Assessment  RUE Assessment RUE Assessment: Within Functional Limits LUE Assessment LUE Assessment: Within Functional Limits RLE Assessment RLE Assessment: Exceptions to [facility] General Strength Comments: grossly 3/5 at hip and knee , ankle strength limited by B cast, LLE Assessment LLE Assessment: Exceptions to [facility] General Strength Comments: grossly 3/5 at hip and knee , ankle strength limited by B cast,   Treatment Session 1  Pt seated in WC upon arrival. Pt agreeable to therapy. Pt denies any pain. Performed all discharge components. Reviewed and performed the following HEP, handout provided. Pt denies any questions/concerns upon discharging home. Pt plans to use non emergency transport to get home.   Access Code: [email or web address] URL: [email or web address] Date: 06/14/2023 Prepared by: Ambrose Finland  Exercises - Supine Active Straight Leg Raise  - 1 x daily - 7 x weekly - 3 sets - 10 reps - Supine Hip Abduction  - 1 x daily - 7 x weekly - 3 sets - 10 reps - Seated Long Arc Quad  - 1 x daily - 7 x weekly - 3 sets - 10 reps - Supine Gluteal Sets  - 1 x daily - 7 x weekly - 3 sets - 10 reps - Seated March  - 1 x daily - 7 x weekly - 3 sets - 10 reps - Wheelchair Push-Up (AKA)  - 1 x daily - 7 x weekly - 3 sets - 10 reps - Seated Hip Abduction with Resistance  - 1 x daily - 7 x weekly - 3 sets - 10 reps  [facility] [place], PT, DTaP Edwin Cap PT, DPT  06/14/2023, 10:15 AM

## 2023-06-14 NOTE — Progress Notes (Signed)
Inpatient Rehabilitation Discharge Medication Review by a Pharmacist  A complete drug regimen review was completed for this patient to identify any potential clinically significant medication issues.  High Risk Drug Classes Is patient taking? Indication by Medication  Antipsychotic No   Anticoagulant Yes Xarelto - hx DVT/PE  Antibiotic No   Opioid Yes Tramadol - acute pain  Antiplatelet Yes ASA 81 - VTE ppx s/p ortho  Hypoglycemics/insulin No   Vasoactive Medication Yes Lasix - HTN  Chemotherapy No   Other Yes Melatonin - sleep Diazepam - anxiety Methocarbamol/flexeril - spasms Omeprazole - GERD Vitamin D - supplementation Gabapentin - neuropathy Lidopain patch - pain Cetirizine - allergy Flonase - allergy Meclizine - dizziness Naloxone - over dose KCL - supplementation     Type of Medication Issue Identified Description of Issue Recommendation(s)  Drug Interaction(s) (clinically significant)     Duplicate Therapy     Allergy     No Medication Administration End Date     Incorrect Dose     Additional Drug Therapy Needed     Significant med changes from prior encounter (inform family/care partners about these prior to discharge).    Other       Clinically significant medication issues were identified that warrant physician communication and completion of prescribed/recommended actions by midnight of the next day:  No  Time spent performing this drug regimen review (minutes):  [id number], PharmD, [place], AAHIVP, CPP Infectious Disease Pharmacist 06/14/2023 9:39 AM

## 2023-06-15 ENCOUNTER — Other Ambulatory Visit (HOSPITAL_COMMUNITY): Payer: Self-pay
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
## 2023-06-15 NOTE — Progress Notes (Signed)
Patient ID: Katie [name], female   DOB: [birth year], 57 y.o.   MRN: [id number]  DME delivered to room. Pt husband to take DME to the home.   Cecile Sheerer, MSW, LCSWA Office: [phone] Cell: [phone] Fax: [phone]
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
## 2023-06-15 NOTE — Progress Notes (Signed)
INPATIENT REHABILITATION DISCHARGE NOTE   Discharge instructions by: Elita Quick PA  Verbalized understanding:yes  Skin care/Wound care healing?cast  bilateral lower extremities  Pain: medicated prior  IV's:none  Tubes/Drains:none  O2:none  Safety instructions:done  Patient belongings:done ; home with husband  Discharged LK:GMWN  Discharged [id number] star  Notes:

## 2023-06-15 NOTE — Progress Notes (Signed)
Patient excited to go home today.  Encouraged IS and flutter valve use as well as much movement as possible and not to lay around.  Encouraged to lay on right side when resting.  Was taking Xarelto PTA.  Husband will be here soon to pick up belongings. All needs met. Call bell in reach.

## 2023-06-19 NOTE — Op Note (Signed)
Katie [name], Katie [name] MEDICAL RECORD NO: [id number] ACCOUNT NO: [id number] DATE OF BIRTH: 1966/03/06 FACILITY: [facility] LOCATION: [facility] PHYSICIAN: Doralee Albino. Carola Frost, MD  Operative Report   DATE OF PROCEDURE: 06/11/2023  PREOPERATIVE DIAGNOSES: 1.  Left pilon fracture, tibia and fibula. 2.  Retained external fixator, left tibial pilon. 3.  Dislocation, left tibiotalar joint. 4.  Status post open reduction and internal fixation of right trimalleolar ankle fracture dislocation.  POSTOPERATIVE DIAGNOSES: 1.  Left pilon fracture, tibia and fibula. 2.  Retained external fixator, left tibial pilon. 3.  Dislocation, left tibiotalar joint. 4.  Status post open reduction and internal fixation of right trimalleolar ankle fracture dislocation.  PROCEDURES: 1.  ORIF of left tibial pilon, tibia and fibula. 2.  Removal of external fixator, left lower extremity. 3.  Debridement of external fixator pin site ulcerations, leg and heel. 4.  Application and removal of femoral distractor intraoperatively. 5.  Open treatment of left ankle dislocation. 6.  Application of manual stress under fluoroscopy, left ankle syndesmosis. 7.  Dressing change under anesthesia, right trimalleolar ankle fracture with reapplication of short leg splint.  SURGEON:  Myrene Galas, MD.  ASSISTANT:  Montez Morita, PA-C.  ANESTHESIA:  General, supplemented with regional block for anesthesia.  COMPLICATIONS:  None.  TOURNIQUET:  None.  SPECIMENS:  None.  DISPOSITION:  To PACU.  CONDITION:  Stable.  BRIEF SUMMARY OF INDICATIONS FOR PROCEDURE:  The patient is a pleasant 57 year old female with bilateral severe ankle injuries.  She is status post ORIF of a right trimalleolar fracture dislocation as well as staged revision of her left ankle external  fixation with ORIF of the tibia only along the medial column to push rotation.  Blistering along the lateral and posterior sides prevented a single stage treatment at her prior  procedure about 10 days ago.  Since that time, she has remained posteriorly  dislocated from the anterior plafond segment despite all efforts by both Dr. Dion Saucier at the initial procedure and myself with the subsequent one to restore length and reduction to the joint itself.  I did discuss with her the possibility of failure of  reduction, arthritis and malunion, nonunion, need for further surgery including fusion, infection and multiple others.  She acknowledged these risks and did wish to proceed.  BRIEF SUMMARY OF PROCEDURE:  The patient was given preoperative antibiotics and a regional block and then taken to the operating room where general anesthesia was induced.  She was positioned prone.  A chlorhexidine wash and Betadine scrub and paint were  performed of the left lower extremity.  After draping, a timeout was held.  The external fixator was then removed and curettage performed of the ulcerated pin sites along the tibia and calcaneus as well as the metatarsal pins. Metatarsal pins were in  good condition.  Generally curettes were used to scrape the skin edges, subcutaneous tissue, the fascia and then also to scrape out the intramedullary bone along the passage tract of the pins themselves.  After this thorough debridement along the bone  tracts, copious irrigation was performed, lap sponges and Ioban were placed and then fresh drapes and gloves for the field and surgical team.  A posterior incision was then made posterior to the fibula.  Dissection was carried carefully down and the interval developed along the peroneal tendons.  I was able to retract those in either direction, so as to address the fibula and tibia.  Using the  strong 2.7 mm plate from the [employer] ankle system,  we were able to secure fixation distally and then applied a screw proximally that a distraction device could be placed against and then engaged.  This was taken essentially to the breaking point to  get as much length  as possible and then secured in an eccentric fashion to garner a little more length.  I did place standard fixation in the distal fragment first and then I exchanged this for a locking fixation, so as to have the most control.  Three  screws were placed proximally and the plate on the left though somewhat indicative of the amount of additional length that was gained.  Next, attention was turned to the posterior tibial pilon, in addition to the dislocation with the talus remained  posterior.  The interval was developed this time retracting the peroneal tendons laterally. With the help of my assistant, a baby Bennett retractors were placed to access the posterior tibia.  Here, we used the [employer] posterior tibial locking  plate and checked on multiple views its position at the plafond.  I secured 2 screws there and then attempted to push the plate posteriorly into a reduced position.  This could not be achieved even with all the manual force that we could apply between  myself and the assistant.  Consequently, we then obtained the femoral distractor and placed a pin in the tibia and an additional pin in the talus itself from a posterior direction.  We then engaged the femoral distractor and began in stepwise fashion to dial the length out.  We  again took this over a series of many minutes to an extended position where the tibiotalar joint at last appeared to be reduced or as close to reduce as could be achieved.  We did confirm the patient was under total skeletal relaxation with anesthesia.   The posterior pilon plate was secured with a standard screws, then locked screws into the articular segment.  In addition to the femoral distractor, I also used a laminar spreader to push against the plate after securing articular reduction to get even  more length and then I placed a standard fixation initially into the far fragment to secure the plafond fracture and then additional standard screw and then  locking screw.  Final images showed appropriate placement of hardware length and trajectory.   Application of manual stress after removal of the femoral distractor external fixation system did not show instability at the syndesmosis.  Wounds were irrigated thoroughly and then closed in standard layered fashion.  Montez Morita, PA-C was present and  assisting me throughout.  An assistant was absolutely necessary for this technically demanding series of procedures.  PROGNOSIS:  The patient will be nonweightbearing bilaterally with bed to chair transfers only.  She will be on formal DVT prophylaxis.  We will plan to see her back in the office in 2 weeks for removal of her splint and conversion into boots bilaterally.   She is at increased risk for arthritis, malunion, nonunion, and need for further surgery in addition to thromboembolic complications. At the conclusion of the procedures for the left, we removed the splint from the right ankle with the expectation of  removing her sutures.  Unfortunately, it was not ready to do so, but we were able to take advantage of this opportunity to debride some skin necrosis from her fracture blisters initially.  After application of the sterile dressing with Adaptic, gauze and  Webril, we then chose to reapply a posterior and stirrup splint  for maximum control of the fracture site on the right as well as the left rather than converting her into a boot at this time and instead, we will do that at her followup appointment.   NIK D: 06/17/2023 4:07:44 pm T: 06/17/2023 10:35:00 pm  JOB: [id number]/ [id number]

## 2023-06-25 ENCOUNTER — Encounter (HOSPITAL_COMMUNITY): Payer: Self-pay | Admitting: Orthopedic Surgery

## 2023-06-27 DIAGNOSIS — S9305XD Dislocation of left ankle joint, subsequent encounter: Secondary | ICD-10-CM | POA: Diagnosis not present

## 2023-06-27 DIAGNOSIS — S82851D Displaced trimalleolar fracture of right lower leg, subsequent encounter for closed fracture with routine healing: Secondary | ICD-10-CM | POA: Diagnosis not present

## 2023-06-27 DIAGNOSIS — S82872D Displaced pilon fracture of left tibia, subsequent encounter for closed fracture with routine healing: Secondary | ICD-10-CM | POA: Diagnosis not present

## 2023-06-27 DIAGNOSIS — S93431D Sprain of tibiofibular ligament of right ankle, subsequent encounter: Secondary | ICD-10-CM | POA: Diagnosis not present

## 2023-07-18 DIAGNOSIS — S82851D Displaced trimalleolar fracture of right lower leg, subsequent encounter for closed fracture with routine healing: Secondary | ICD-10-CM | POA: Diagnosis not present

## 2023-07-18 DIAGNOSIS — S82872D Displaced pilon fracture of left tibia, subsequent encounter for closed fracture with routine healing: Secondary | ICD-10-CM | POA: Diagnosis not present

## 2023-07-18 DIAGNOSIS — S93431D Sprain of tibiofibular ligament of right ankle, subsequent encounter: Secondary | ICD-10-CM | POA: Diagnosis not present

## 2023-07-18 DIAGNOSIS — S9305XD Dislocation of left ankle joint, subsequent encounter: Secondary | ICD-10-CM | POA: Diagnosis not present

## 2023-08-15 DIAGNOSIS — S93431D Sprain of tibiofibular ligament of right ankle, subsequent encounter: Secondary | ICD-10-CM | POA: Diagnosis not present

## 2023-08-15 DIAGNOSIS — S82872D Displaced pilon fracture of left tibia, subsequent encounter for closed fracture with routine healing: Secondary | ICD-10-CM | POA: Diagnosis not present

## 2023-08-15 DIAGNOSIS — S82851D Displaced trimalleolar fracture of right lower leg, subsequent encounter for closed fracture with routine healing: Secondary | ICD-10-CM | POA: Diagnosis not present

## 2023-08-15 DIAGNOSIS — S9305XD Dislocation of left ankle joint, subsequent encounter: Secondary | ICD-10-CM | POA: Diagnosis not present

## 2023-08-16 ENCOUNTER — Ambulatory Visit (HOSPITAL_BASED_OUTPATIENT_CLINIC_OR_DEPARTMENT_OTHER): Payer: Managed Care, Other (non HMO) | Attending: Orthopedic Surgery | Admitting: Physical Therapy
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
## 2023-09-12 DIAGNOSIS — S82872D Displaced pilon fracture of left tibia, subsequent encounter for closed fracture with routine healing: Secondary | ICD-10-CM | POA: Diagnosis not present

## 2023-09-12 DIAGNOSIS — M25572 Pain in left ankle and joints of left foot: Secondary | ICD-10-CM | POA: Diagnosis not present

## 2023-09-12 DIAGNOSIS — M25571 Pain in right ankle and joints of right foot: Secondary | ICD-10-CM | POA: Diagnosis not present

## 2023-09-12 DIAGNOSIS — S93431D Sprain of tibiofibular ligament of right ankle, subsequent encounter: Secondary | ICD-10-CM | POA: Diagnosis not present

## 2023-09-26 ENCOUNTER — Encounter (HOSPITAL_BASED_OUTPATIENT_CLINIC_OR_DEPARTMENT_OTHER): Payer: Self-pay | Admitting: Physical Therapy

## 2023-09-26 ENCOUNTER — Ambulatory Visit (HOSPITAL_BASED_OUTPATIENT_CLINIC_OR_DEPARTMENT_OTHER): Payer: Commercial Managed Care - HMO | Attending: Orthopedic Surgery | Admitting: Physical Therapy

## 2023-09-26 ENCOUNTER — Other Ambulatory Visit: Payer: Self-pay

## 2023-09-26 DIAGNOSIS — M5459 Other low back pain: Secondary | ICD-10-CM

## 2023-09-26 DIAGNOSIS — M6281 Muscle weakness (generalized): Secondary | ICD-10-CM

## 2023-09-26 DIAGNOSIS — R2689 Other abnormalities of gait and mobility: Secondary | ICD-10-CM | POA: Diagnosis present

## 2023-09-26 DIAGNOSIS — M25571 Pain in right ankle and joints of right foot: Secondary | ICD-10-CM

## 2023-09-26 DIAGNOSIS — M25572 Pain in left ankle and joints of left foot: Secondary | ICD-10-CM | POA: Diagnosis present
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
## 2023-10-02 ENCOUNTER — Encounter (HOSPITAL_BASED_OUTPATIENT_CLINIC_OR_DEPARTMENT_OTHER): Payer: Self-pay | Admitting: Physical Therapy

## 2023-10-02 ENCOUNTER — Ambulatory Visit (HOSPITAL_BASED_OUTPATIENT_CLINIC_OR_DEPARTMENT_OTHER): Payer: Commercial Managed Care - HMO | Admitting: Physical Therapy

## 2023-10-02 DIAGNOSIS — M25572 Pain in left ankle and joints of left foot: Secondary | ICD-10-CM

## 2023-10-02 DIAGNOSIS — M5459 Other low back pain: Secondary | ICD-10-CM

## 2023-10-02 DIAGNOSIS — M6281 Muscle weakness (generalized): Secondary | ICD-10-CM

## 2023-10-02 DIAGNOSIS — M25571 Pain in right ankle and joints of right foot: Secondary | ICD-10-CM
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
## 2023-10-09 ENCOUNTER — Ambulatory Visit (HOSPITAL_BASED_OUTPATIENT_CLINIC_OR_DEPARTMENT_OTHER): Payer: Commercial Managed Care - HMO | Admitting: Physical Therapy

## 2023-10-11 ENCOUNTER — Encounter (HOSPITAL_BASED_OUTPATIENT_CLINIC_OR_DEPARTMENT_OTHER): Payer: Self-pay | Admitting: Physical Therapy

## 2023-10-11 ENCOUNTER — Ambulatory Visit (HOSPITAL_BASED_OUTPATIENT_CLINIC_OR_DEPARTMENT_OTHER): Payer: Commercial Managed Care - HMO | Attending: Orthopedic Surgery | Admitting: Physical Therapy

## 2023-10-11 DIAGNOSIS — M5459 Other low back pain: Secondary | ICD-10-CM | POA: Insufficient documentation

## 2023-10-11 DIAGNOSIS — M6281 Muscle weakness (generalized): Secondary | ICD-10-CM | POA: Insufficient documentation

## 2023-10-11 DIAGNOSIS — M25572 Pain in left ankle and joints of left foot: Secondary | ICD-10-CM | POA: Insufficient documentation

## 2023-10-11 DIAGNOSIS — R2689 Other abnormalities of gait and mobility: Secondary | ICD-10-CM | POA: Insufficient documentation

## 2023-10-11 DIAGNOSIS — M25571 Pain in right ankle and joints of right foot: Secondary | ICD-10-CM | POA: Insufficient documentation
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
## 2023-10-15 ENCOUNTER — Ambulatory Visit (HOSPITAL_BASED_OUTPATIENT_CLINIC_OR_DEPARTMENT_OTHER): Payer: Commercial Managed Care - HMO | Admitting: Physical Therapy

## 2023-10-17 ENCOUNTER — Ambulatory Visit (HOSPITAL_BASED_OUTPATIENT_CLINIC_OR_DEPARTMENT_OTHER): Payer: Commercial Managed Care - HMO | Admitting: Physical Therapy

## 2023-10-17 IMAGING — CR DG CHEST 2V
2 series · 2 of 2 positions shown · non-contrast
Comparison: 07/26/2021

CLINICAL DATA: Chest pain

EXAM:
CHEST - 2 VIEW

[chest pa]
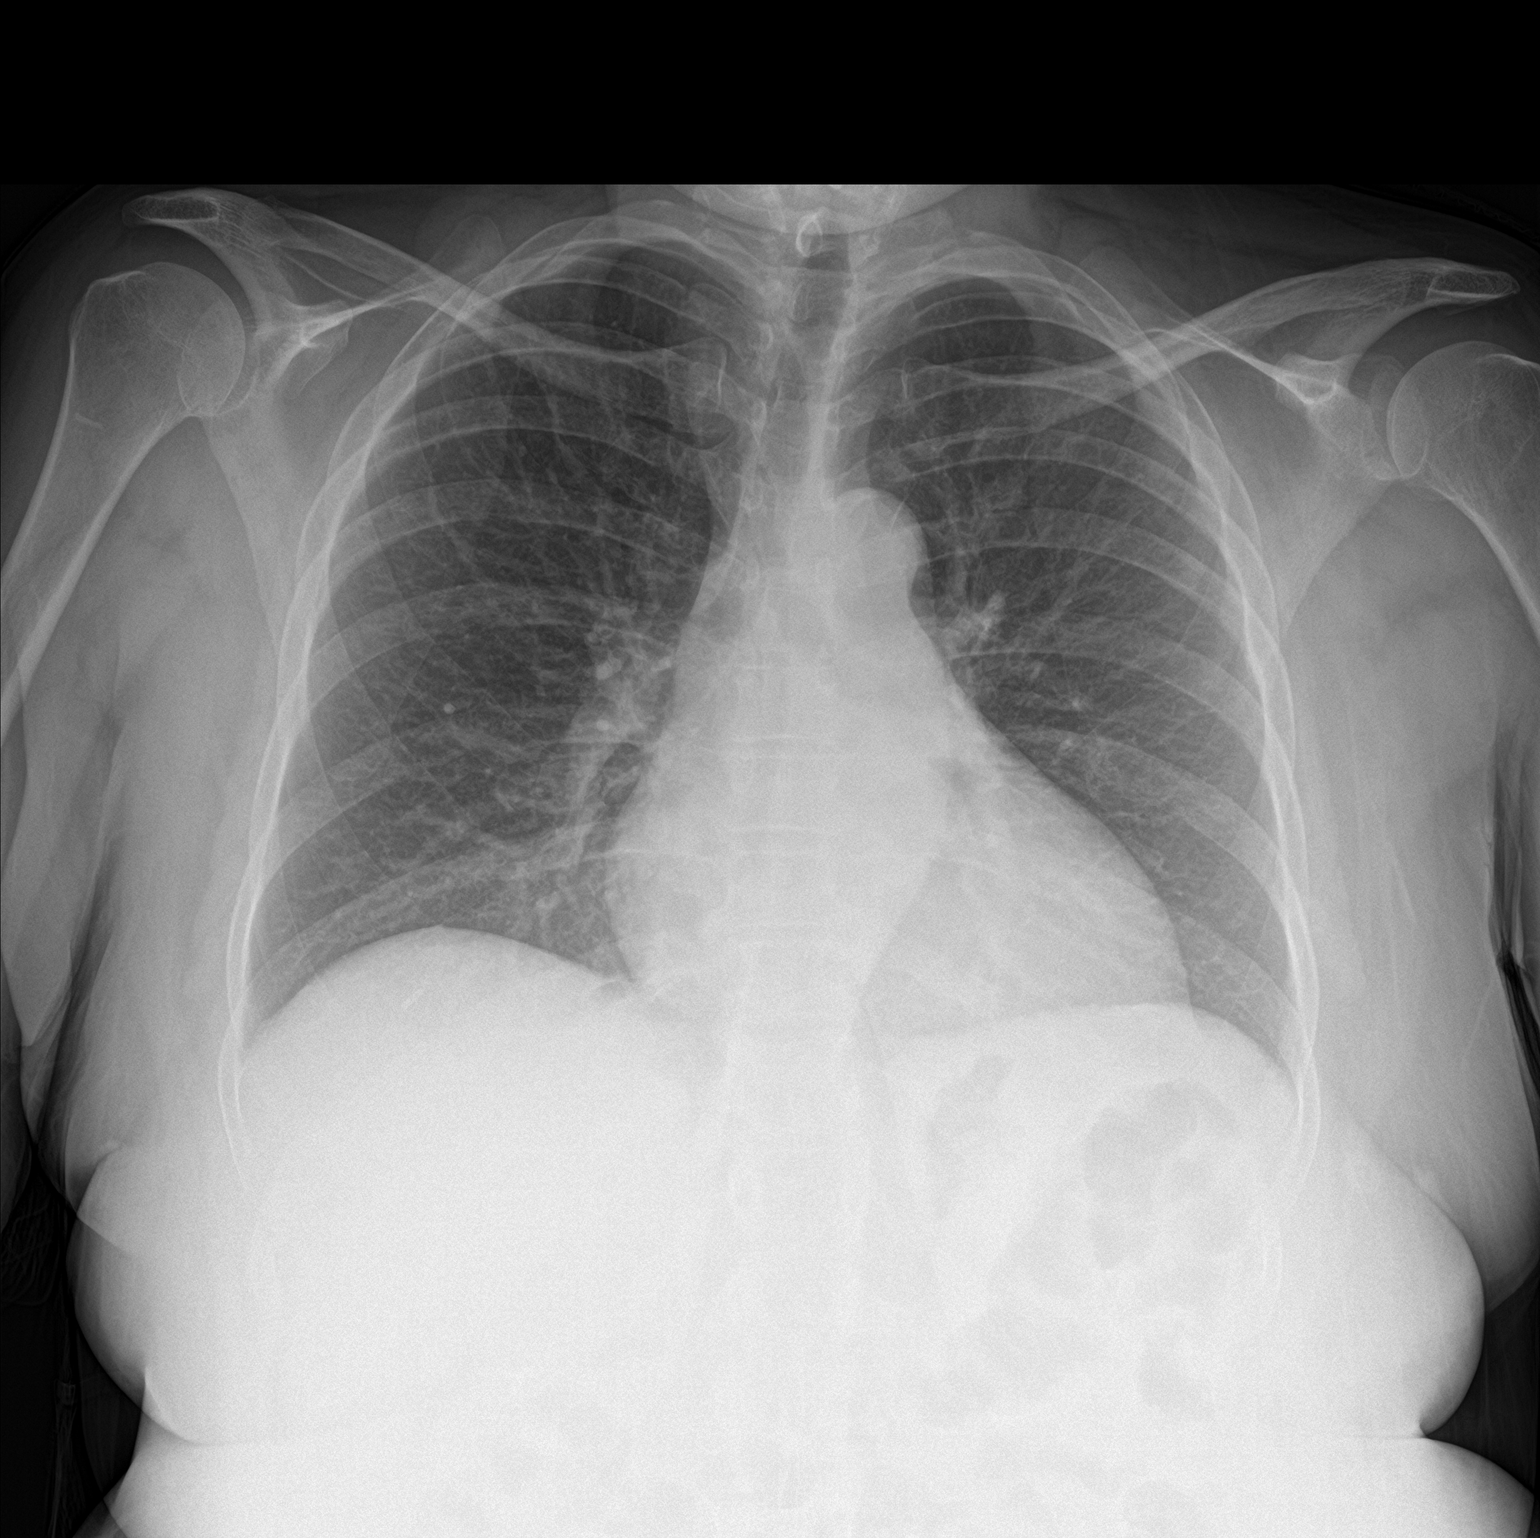

[chest lat]
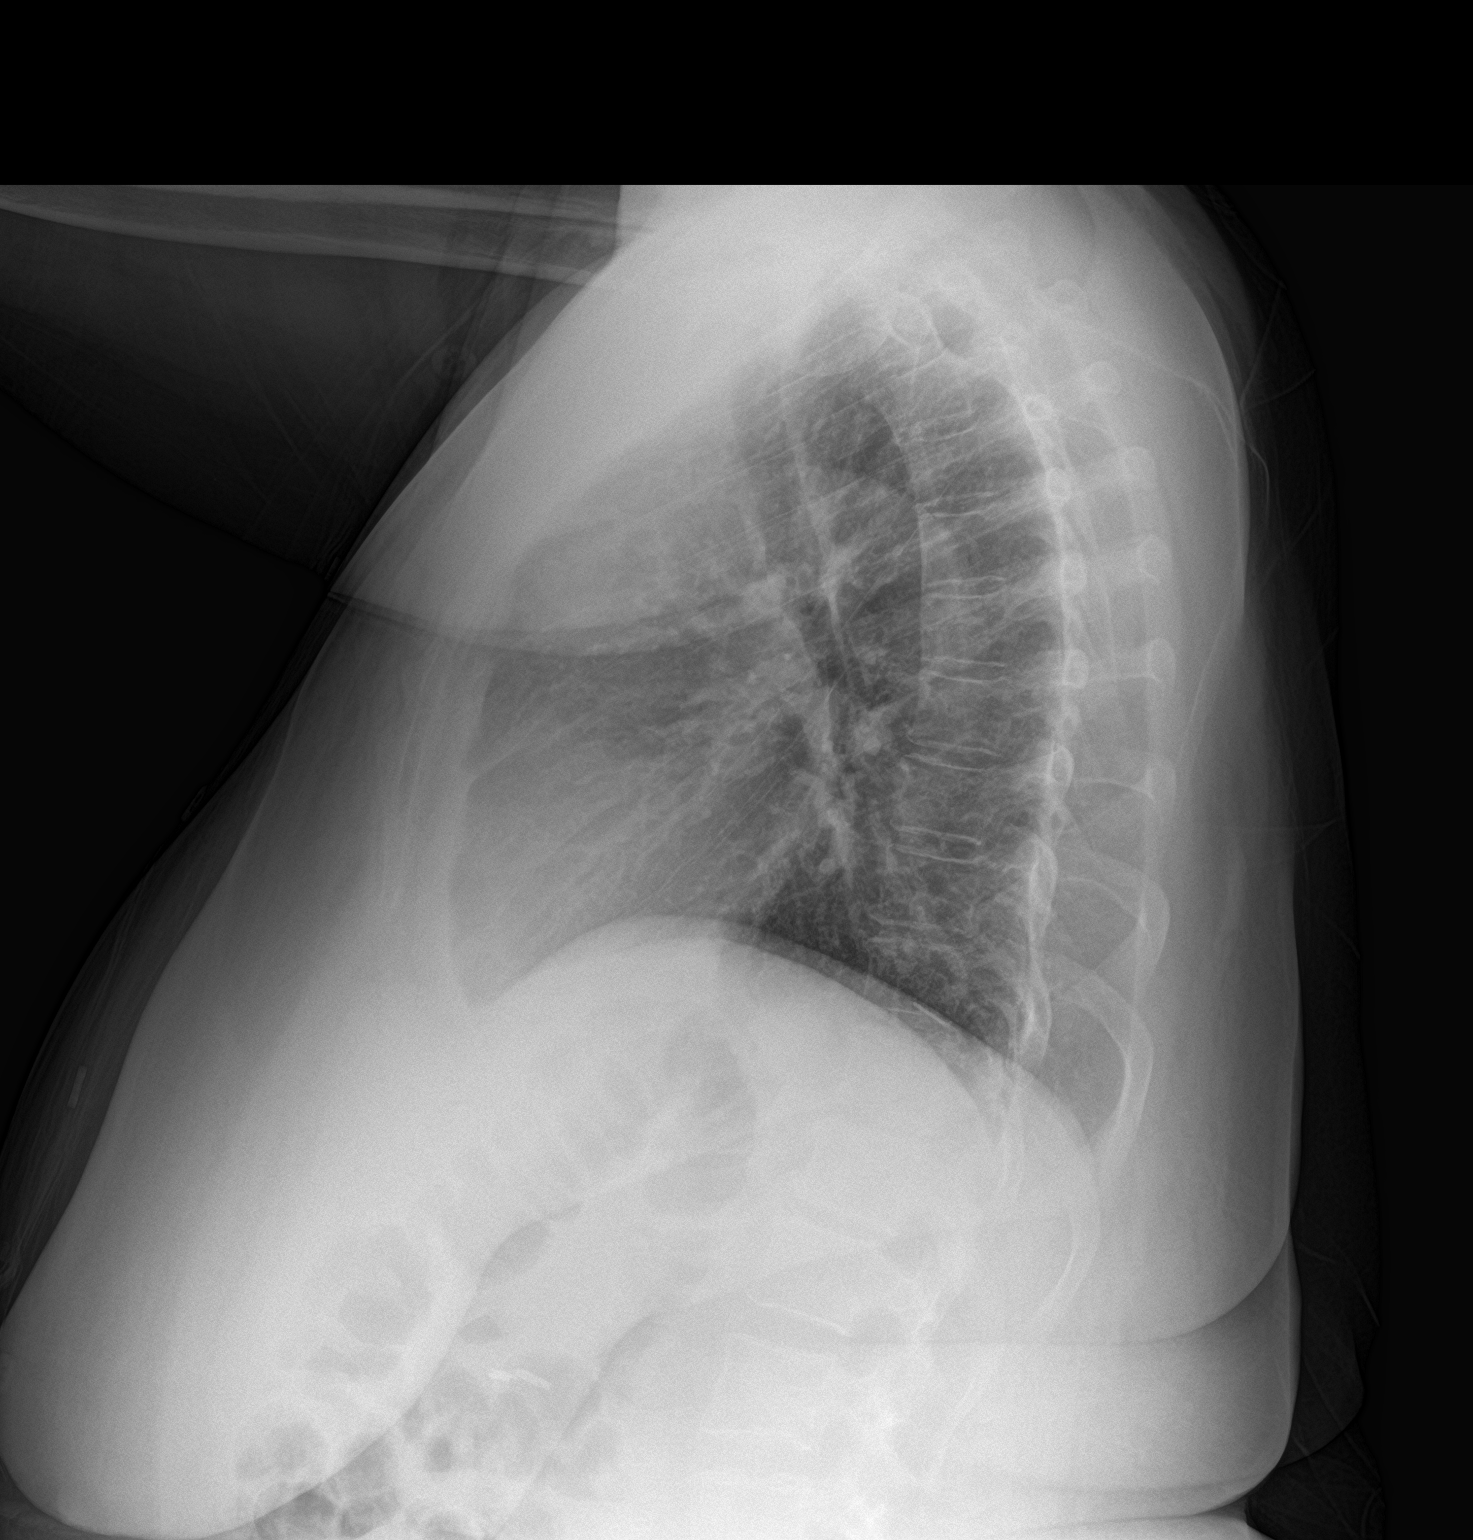

[2 of 2 positions shown; findings below may reference images not displayed]

FINDINGS: Normal heart size. Aortic tortuosity. No acute infiltrate or edema.
No effusion or pneumothorax. No acute osseous findings.
IMPRESSION: No active cardiopulmonary disease.

## 2023-10-19 ENCOUNTER — Ambulatory Visit (HOSPITAL_BASED_OUTPATIENT_CLINIC_OR_DEPARTMENT_OTHER): Payer: Commercial Managed Care - HMO | Admitting: Physical Therapy

## 2023-10-24 ENCOUNTER — Encounter (HOSPITAL_BASED_OUTPATIENT_CLINIC_OR_DEPARTMENT_OTHER): Payer: Self-pay

## 2023-10-24 ENCOUNTER — Ambulatory Visit (HOSPITAL_BASED_OUTPATIENT_CLINIC_OR_DEPARTMENT_OTHER): Payer: Commercial Managed Care - HMO

## 2023-10-24 DIAGNOSIS — M25571 Pain in right ankle and joints of right foot: Secondary | ICD-10-CM

## 2023-10-24 DIAGNOSIS — M5459 Other low back pain: Secondary | ICD-10-CM

## 2023-10-24 DIAGNOSIS — M25572 Pain in left ankle and joints of left foot: Secondary | ICD-10-CM

## 2023-10-24 DIAGNOSIS — R2689 Other abnormalities of gait and mobility: Secondary | ICD-10-CM

## 2023-10-24 DIAGNOSIS — M6281 Muscle weakness (generalized): Secondary | ICD-10-CM
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
## 2023-10-26 ENCOUNTER — Encounter (HOSPITAL_BASED_OUTPATIENT_CLINIC_OR_DEPARTMENT_OTHER): Payer: Self-pay | Admitting: Physical Therapy

## 2023-10-26 ENCOUNTER — Ambulatory Visit (HOSPITAL_BASED_OUTPATIENT_CLINIC_OR_DEPARTMENT_OTHER): Payer: Commercial Managed Care - HMO | Admitting: Physical Therapy

## 2023-10-26 DIAGNOSIS — M25572 Pain in left ankle and joints of left foot: Secondary | ICD-10-CM

## 2023-10-26 DIAGNOSIS — M6281 Muscle weakness (generalized): Secondary | ICD-10-CM

## 2023-10-26 DIAGNOSIS — M25571 Pain in right ankle and joints of right foot: Secondary | ICD-10-CM
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
## 2023-10-30 ENCOUNTER — Other Ambulatory Visit: Payer: Self-pay

## 2023-10-30 ENCOUNTER — Emergency Department (HOSPITAL_COMMUNITY)
Admission: EM | Admit: 2023-10-30 | Discharge: 2023-10-30 | Disposition: A | Discharge status: Home / Self Care | Payer: Commercial Managed Care - HMO | Attending: Emergency Medicine | Admitting: Emergency Medicine

## 2023-10-30 ENCOUNTER — Ambulatory Visit (HOSPITAL_BASED_OUTPATIENT_CLINIC_OR_DEPARTMENT_OTHER): Payer: Commercial Managed Care - HMO | Admitting: Physical Therapy

## 2023-10-30 ENCOUNTER — Encounter (HOSPITAL_COMMUNITY): Payer: Self-pay

## 2023-10-30 ENCOUNTER — Emergency Department (HOSPITAL_COMMUNITY): Payer: Commercial Managed Care - HMO

## 2023-10-30 DIAGNOSIS — R059 Cough, unspecified: Secondary | ICD-10-CM | POA: Diagnosis present

## 2023-10-30 DIAGNOSIS — J069 Acute upper respiratory infection, unspecified: Secondary | ICD-10-CM | POA: Insufficient documentation

## 2023-10-30 DIAGNOSIS — Z7982 Long term (current) use of aspirin: Secondary | ICD-10-CM | POA: Insufficient documentation

## 2023-10-30 DIAGNOSIS — Z79899 Other long term (current) drug therapy: Secondary | ICD-10-CM | POA: Diagnosis not present

## 2023-10-30 DIAGNOSIS — J40 Bronchitis, not specified as acute or chronic: Secondary | ICD-10-CM | POA: Insufficient documentation

## 2023-10-30 DIAGNOSIS — I10 Essential (primary) hypertension: Secondary | ICD-10-CM | POA: Diagnosis not present

## 2023-10-30 DIAGNOSIS — Z1152 Encounter for screening for COVID-19: Secondary | ICD-10-CM | POA: Diagnosis not present

## 2023-10-30 DIAGNOSIS — Z7901 Long term (current) use of anticoagulants: Secondary | ICD-10-CM | POA: Diagnosis not present

## 2023-10-30 LAB — RESP PANEL BY RT-PCR (RSV, FLU A&B, COVID)  RVPGX2
Influenza A by PCR: NEGATIVE
Influenza B by PCR: NEGATIVE
Resp Syncytial Virus by PCR: NEGATIVE
SARS Coronavirus 2 by RT PCR: NEGATIVE

## 2023-10-30 MED ORDER — AZITHROMYCIN 250 MG PO TABS: 250.0000 mg | ORAL_TABLET | Freq: Every day | ORAL | 0 refills | Status: AC

## 2023-10-30 MED ORDER — DEXAMETHASONE 4 MG PO TABS
10.0000 mg | ORAL_TABLET | Freq: Once | ORAL | Status: AC
  Administered 2023-10-30: 10 mg via ORAL
  Filled 2023-10-30: qty 3

## 2023-10-30 NOTE — ED Triage Notes (Signed)
I have had a cough for about 48 hours, and I don't feel good.  Denies fever, but has body aches.

## 2023-10-30 NOTE — Discharge Instructions (Addendum)
Take antibiotics as prescribed.  Please follow-up your COVID flu and RSV testing on your MyChart.

## 2023-10-30 NOTE — ED Provider Notes (Signed)
[place] EMERGENCY DEPARTMENT AT [facility] Provider Note   CSN: [id number] Arrival date & time: 10/30/23  1009     History  Chief Complaint  Patient presents with   Cough    Katie [name] is a 57 y.o. female.  Patient here with cough and congestion for the last 2 days.  Maybe sick contacts.  Denies any fever but has had bodyaches.  History of PE on Xarelto.  History of hypertension.  Denies any smoking history.  No COPD or asthma history.  Denies any chest pain shortness of breath headache neck pain.  The history is provided by the patient.       Home Medications Prior to Admission medications   Medication Sig Start Date End Date Taking? Authorizing Provider  azithromycin (ZITHROMAX) 250 MG tablet Take 1 tablet (250 mg total) by mouth daily. Take first 2 tablets together, then 1 every day until finished. 10/30/23  Yes Aikam Hellickson, DO  aspirin 81 MG chewable tablet Chew 81 mg by mouth daily.    [provider]  cetirizine (ZYRTEC ALLERGY) 10 MG tablet Take 1 tablet (10 mg total) by mouth daily. 03/05/23   Rising, Lurena Joiner, PA-C  vitamin D3 (CHOLECALCIFEROL) 25 MCG tablet Take 2 tablets (2,000 Units total) by mouth 2 (two) times daily. 06/14/23   Love, Evlyn Kanner, PA-C  cyclobenzaprine (FLEXERIL) 5 MG tablet Take 1 tablet (5 mg total) by mouth 3 (three) times daily as needed for muscle spasms. 06/14/23   Love, Evlyn Kanner, PA-C  diazepam (VALIUM) 10 MG tablet Take 0.5 tablets (5 mg total) by mouth every 12 (twelve) hours as needed for anxiety. 06/14/23   Love, Evlyn Kanner, PA-C  fluticasone (FLONASE) 50 MCG/ACT nasal spray Place 2 sprays into both nostrils daily. 03/05/23   Rising, Lurena Joiner, PA-C  furosemide (LASIX) 40 MG tablet Take 1 tablet (40 mg total) by mouth daily. 06/14/23   Love, Evlyn Kanner, PA-C  gabapentin (NEURONTIN) 300 MG capsule Take 1 capsule (300 mg total) by mouth 3 (three) times daily. 06/14/23   Love, Evlyn Kanner, PA-C  lidocaine (LIDODERM) 5 % Place 2 patches  onto the skin daily. Remove & Discard patch within 12 hours or as directed by MD 06/14/23   Love, Evlyn Kanner, PA-C  magnesium oxide (MAG-OX) 400 MG tablet Take 0.5 tablets (200 mg total) by mouth daily. 06/14/23   Love, Evlyn Kanner, PA-C  meclizine (ANTIVERT) 25 MG tablet Take 25 mg by mouth every 6 (six) hours as needed for dizziness. 09/23/21   [provider]  melatonin 5 MG TABS Take 1 tablet (5 mg total) by mouth at bedtime as needed. 06/14/23   Love, Evlyn Kanner, PA-C  Menthol, Topical Analgesic, (ABSORBINE JR BACK PATCH EX) Apply 1 patch topically daily as needed (for back pain).    [provider]  methocarbamol (ROBAXIN) 750 MG tablet Take 1 tablet (750 mg total) by mouth every 6 (six) hours. 06/14/23   Love, Evlyn Kanner, PA-C  naloxone [facility]) nasal spray 4 mg/0.1 mL Place 1 spray into the nose as needed. In case of overdose 06/14/23   Love, Evlyn Kanner, PA-C  omeprazole (PRILOSEC) 40 MG capsule Take 40 mg by mouth daily.    [provider]  oxyCODONE (OXY IR/ROXICODONE) 5 MG immediate release tablet Take 1-2 tablets (5-10 mg total) by mouth every 4 (four) hours as needed (5mg  for paim 5-7 level and 10 mg for pain > level 7). 06/14/23   Love, Evlyn Kanner, PA-C  polyethylene glycol powder (GLYCOLAX/MIRALAX) 17 GM/SCOOP powder Take 1 capful (17 g) by mouth 2 (two) times daily. 06/14/23   Love, Evlyn Kanner, PA-C  potassium chloride SA (KLOR-CON M) 20 MEQ tablet Take 1 tablet (20 mEq total) by mouth 2 (two) times daily. 06/14/23   Love, Evlyn Kanner, PA-C  rivaroxaban (XARELTO) 20 MG TABS tablet Take 1 tablet (20 mg total) by mouth daily with supper. 06/14/23   Love, Evlyn Kanner, PA-C  traMADol (ULTRAM) 50 MG tablet Take 1 tablet (50 mg total) by mouth every 6 (six) hours. 06/14/23   Love, Evlyn Kanner, PA-C      Allergies    Contrast media [iodinated contrast media], Percocet [oxycodone-acetaminophen], Toradol [ketorolac tromethamine], Tylenol [acetaminophen], and Vicodin [hydrocodone-acetaminophen]    Review of  Systems   Review of Systems  Physical Exam Updated Vital Signs BP (!) 122/90   Pulse (!) 59   Temp 98.3 F (36.8 C) (Oral)   Resp 19   LMP 12/28/2015 (Exact Date)   SpO2 97%  Physical Exam Vitals and nursing note reviewed.  Constitutional:      General: She is not in acute distress.    Appearance: She is well-developed. She is not ill-appearing.  HENT:     Head: Normocephalic and atraumatic.     Nose: Congestion present.     Mouth/Throat:     Mouth: Mucous membranes are moist.  Eyes:     Extraocular Movements: Extraocular movements intact.     Conjunctiva/sclera: Conjunctivae normal.     Pupils: Pupils are equal, round, and reactive to light.  Cardiovascular:     Rate and Rhythm: Normal rate and regular rhythm.     Pulses: Normal pulses.     Heart sounds: Normal heart sounds. No murmur heard. Pulmonary:     Effort: Pulmonary effort is normal. No respiratory distress.     Breath sounds: Normal breath sounds.  Abdominal:     Palpations: Abdomen is soft.     Tenderness: There is no abdominal tenderness.  Musculoskeletal:        General: No swelling.     Cervical back: Normal range of motion and neck supple.  Skin:    General: Skin is warm and dry.     Capillary Refill: Capillary refill takes less than 2 seconds.  Neurological:     Mental Status: She is alert.  Psychiatric:        Mood and Affect: Mood normal.     ED Results / Procedures / Treatments   Labs (all labs ordered are listed, but only abnormal results are displayed) Labs Reviewed  RESP PANEL BY RT-PCR (RSV, FLU A&B, COVID)  [id number]    EKG None  Radiology DG Chest Portable 1 View Result Date: 10/30/2023 CLINICAL DATA:  Cough. EXAM: PORTABLE CHEST 1 VIEW COMPARISON:  Chest radiograph dated June 13, 2023. FINDINGS: The heart size and mediastinal contours are within normal limits. No focal consolidation, sizeable pleural effusion, or pneumothorax. The visualized skeletal structures are unremarkable.  IMPRESSION: No acute cardiopulmonary findings. Electronically Signed   By: Hart Robinsons M.D.   On: 10/30/2023 11:38    Procedures Procedures    Medications Ordered in ED Medications  dexamethasone (DECADRON) tablet 10 mg (10 mg Oral Given 10/30/23 1113)    ED Course/ Medical Decision Making/ A&P                                 Medical  Decision Making Amount and/or Complexity of Data Reviewed Radiology: ordered.  Risk Prescription drug management.   [name] is here for cough.  Viral symptoms for the last 2 days.  Unremarkable vitals.  Well-appearing.  Harsh cough on exam.  Clear breath sounds.  No signs of throat infection.  Nasal congestion.  Differential diagnosis likely bronchitis/viral process versus pneumonia.  Will give Decadron get a chest x-ray.  Otherwise well-appearing.  I have no concern for strep throat or other infectious process at this time.  Chest x-ray per my review and interpretation with no evidence of pneumonia or pneumothorax.  Radiology report with the same.  Overall suspect viral process/bronchitis.  Will treat with Z-Pak.  Discharged in good condition.  Understands return precautions.  This chart was dictated using voice recognition software.  Despite best efforts to proofread,  errors can occur which can change the documentation meaning.         Final Clinical Impression(s) / ED Diagnoses Final diagnoses:  Viral URI with cough  Bronchitis    Rx / DC Orders ED Discharge Orders          Ordered    azithromycin (ZITHROMAX) 250 MG tablet  Daily        10/30/23 1149              [place], DO 10/30/23 1149

## 2023-11-06 ENCOUNTER — Ambulatory Visit (HOSPITAL_BASED_OUTPATIENT_CLINIC_OR_DEPARTMENT_OTHER): Payer: Commercial Managed Care - HMO

## 2023-11-06 ENCOUNTER — Encounter (HOSPITAL_BASED_OUTPATIENT_CLINIC_OR_DEPARTMENT_OTHER): Payer: Self-pay

## 2023-11-06 DIAGNOSIS — M5459 Other low back pain: Secondary | ICD-10-CM

## 2023-11-06 DIAGNOSIS — M25571 Pain in right ankle and joints of right foot: Secondary | ICD-10-CM

## 2023-11-06 DIAGNOSIS — R2689 Other abnormalities of gait and mobility: Secondary | ICD-10-CM

## 2023-11-06 DIAGNOSIS — M25572 Pain in left ankle and joints of left foot: Secondary | ICD-10-CM | POA: Diagnosis not present

## 2023-11-06 DIAGNOSIS — M6281 Muscle weakness (generalized): Secondary | ICD-10-CM
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
## 2023-11-09 ENCOUNTER — Encounter (HOSPITAL_BASED_OUTPATIENT_CLINIC_OR_DEPARTMENT_OTHER): Payer: Self-pay | Admitting: Physical Therapy

## 2023-11-09 ENCOUNTER — Ambulatory Visit (HOSPITAL_BASED_OUTPATIENT_CLINIC_OR_DEPARTMENT_OTHER): Payer: Commercial Managed Care - HMO | Attending: Orthopedic Surgery | Admitting: Physical Therapy

## 2023-11-09 DIAGNOSIS — M25572 Pain in left ankle and joints of left foot: Secondary | ICD-10-CM | POA: Insufficient documentation

## 2023-11-09 DIAGNOSIS — M25571 Pain in right ankle and joints of right foot: Secondary | ICD-10-CM | POA: Insufficient documentation

## 2023-11-09 DIAGNOSIS — M5459 Other low back pain: Secondary | ICD-10-CM | POA: Insufficient documentation

## 2023-11-09 DIAGNOSIS — M6281 Muscle weakness (generalized): Secondary | ICD-10-CM | POA: Insufficient documentation

## 2023-11-09 DIAGNOSIS — R2689 Other abnormalities of gait and mobility: Secondary | ICD-10-CM | POA: Insufficient documentation
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
## 2023-11-13 ENCOUNTER — Ambulatory Visit (HOSPITAL_BASED_OUTPATIENT_CLINIC_OR_DEPARTMENT_OTHER): Payer: Commercial Managed Care - HMO | Admitting: Physical Therapy

## 2023-11-13 ENCOUNTER — Encounter (HOSPITAL_BASED_OUTPATIENT_CLINIC_OR_DEPARTMENT_OTHER): Payer: Self-pay | Admitting: Physical Therapy

## 2023-11-13 DIAGNOSIS — M6281 Muscle weakness (generalized): Secondary | ICD-10-CM

## 2023-11-13 DIAGNOSIS — R2689 Other abnormalities of gait and mobility: Secondary | ICD-10-CM | POA: Diagnosis present

## 2023-11-13 DIAGNOSIS — M25572 Pain in left ankle and joints of left foot: Secondary | ICD-10-CM | POA: Diagnosis present

## 2023-11-13 DIAGNOSIS — M25571 Pain in right ankle and joints of right foot: Secondary | ICD-10-CM | POA: Diagnosis present

## 2023-11-13 DIAGNOSIS — M1712 Unilateral primary osteoarthritis, left knee: Secondary | ICD-10-CM | POA: Diagnosis not present

## 2023-11-13 DIAGNOSIS — M5459 Other low back pain: Secondary | ICD-10-CM | POA: Diagnosis present
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
## 2023-11-14 DIAGNOSIS — S22080A Wedge compression fracture of T11-T12 vertebra, initial encounter for closed fracture: Secondary | ICD-10-CM | POA: Diagnosis not present

## 2023-11-14 DIAGNOSIS — M1712 Unilateral primary osteoarthritis, left knee: Secondary | ICD-10-CM | POA: Diagnosis not present

## 2023-11-14 DIAGNOSIS — Z7409 Other reduced mobility: Secondary | ICD-10-CM | POA: Diagnosis not present

## 2023-11-15 ENCOUNTER — Ambulatory Visit (HOSPITAL_BASED_OUTPATIENT_CLINIC_OR_DEPARTMENT_OTHER): Payer: Commercial Managed Care - HMO

## 2023-11-15 ENCOUNTER — Encounter (HOSPITAL_BASED_OUTPATIENT_CLINIC_OR_DEPARTMENT_OTHER): Payer: Self-pay

## 2023-11-15 DIAGNOSIS — M25571 Pain in right ankle and joints of right foot: Secondary | ICD-10-CM

## 2023-11-15 DIAGNOSIS — R2689 Other abnormalities of gait and mobility: Secondary | ICD-10-CM

## 2023-11-15 DIAGNOSIS — M25572 Pain in left ankle and joints of left foot: Secondary | ICD-10-CM | POA: Diagnosis not present

## 2023-11-15 DIAGNOSIS — M6281 Muscle weakness (generalized): Secondary | ICD-10-CM

## 2023-11-15 DIAGNOSIS — M5459 Other low back pain: Secondary | ICD-10-CM

## 2023-11-15 DIAGNOSIS — M1712 Unilateral primary osteoarthritis, left knee: Secondary | ICD-10-CM | POA: Diagnosis not present
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
## 2023-11-16 DIAGNOSIS — M1712 Unilateral primary osteoarthritis, left knee: Secondary | ICD-10-CM | POA: Diagnosis not present

## 2023-11-17 DIAGNOSIS — M1712 Unilateral primary osteoarthritis, left knee: Secondary | ICD-10-CM | POA: Diagnosis not present

## 2023-11-18 DIAGNOSIS — M1712 Unilateral primary osteoarthritis, left knee: Secondary | ICD-10-CM | POA: Diagnosis not present

## 2023-11-19 DIAGNOSIS — M1712 Unilateral primary osteoarthritis, left knee: Secondary | ICD-10-CM | POA: Diagnosis not present

## 2023-11-20 ENCOUNTER — Ambulatory Visit (HOSPITAL_BASED_OUTPATIENT_CLINIC_OR_DEPARTMENT_OTHER): Payer: Commercial Managed Care - HMO | Admitting: Physical Therapy

## 2023-11-20 ENCOUNTER — Encounter (HOSPITAL_BASED_OUTPATIENT_CLINIC_OR_DEPARTMENT_OTHER): Payer: Self-pay | Admitting: Physical Therapy

## 2023-11-20 DIAGNOSIS — M1712 Unilateral primary osteoarthritis, left knee: Secondary | ICD-10-CM | POA: Diagnosis not present

## 2023-11-20 DIAGNOSIS — M6281 Muscle weakness (generalized): Secondary | ICD-10-CM

## 2023-11-20 DIAGNOSIS — M25572 Pain in left ankle and joints of left foot: Secondary | ICD-10-CM | POA: Diagnosis not present

## 2023-11-20 DIAGNOSIS — M25571 Pain in right ankle and joints of right foot: Secondary | ICD-10-CM
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
## 2023-11-21 DIAGNOSIS — M1712 Unilateral primary osteoarthritis, left knee: Secondary | ICD-10-CM | POA: Diagnosis not present

## 2023-11-22 ENCOUNTER — Encounter (HOSPITAL_BASED_OUTPATIENT_CLINIC_OR_DEPARTMENT_OTHER): Payer: Self-pay

## 2023-11-22 ENCOUNTER — Ambulatory Visit (HOSPITAL_BASED_OUTPATIENT_CLINIC_OR_DEPARTMENT_OTHER): Payer: Commercial Managed Care - HMO

## 2023-11-22 DIAGNOSIS — R2689 Other abnormalities of gait and mobility: Secondary | ICD-10-CM

## 2023-11-22 DIAGNOSIS — M1712 Unilateral primary osteoarthritis, left knee: Secondary | ICD-10-CM | POA: Diagnosis not present

## 2023-11-22 DIAGNOSIS — M25572 Pain in left ankle and joints of left foot: Secondary | ICD-10-CM

## 2023-11-22 DIAGNOSIS — M5459 Other low back pain: Secondary | ICD-10-CM

## 2023-11-22 DIAGNOSIS — M25571 Pain in right ankle and joints of right foot: Secondary | ICD-10-CM

## 2023-11-22 DIAGNOSIS — M6281 Muscle weakness (generalized): Secondary | ICD-10-CM
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
## 2023-11-23 DIAGNOSIS — M1712 Unilateral primary osteoarthritis, left knee: Secondary | ICD-10-CM | POA: Diagnosis not present

## 2023-11-24 DIAGNOSIS — M1712 Unilateral primary osteoarthritis, left knee: Secondary | ICD-10-CM | POA: Diagnosis not present

## 2023-11-25 DIAGNOSIS — M1712 Unilateral primary osteoarthritis, left knee: Secondary | ICD-10-CM | POA: Diagnosis not present

## 2023-11-26 DIAGNOSIS — M1712 Unilateral primary osteoarthritis, left knee: Secondary | ICD-10-CM | POA: Diagnosis not present

## 2023-11-27 ENCOUNTER — Ambulatory Visit (HOSPITAL_BASED_OUTPATIENT_CLINIC_OR_DEPARTMENT_OTHER): Payer: Commercial Managed Care - HMO | Admitting: Physical Therapy

## 2023-11-27 DIAGNOSIS — M1712 Unilateral primary osteoarthritis, left knee: Secondary | ICD-10-CM | POA: Diagnosis not present

## 2023-11-28 DIAGNOSIS — M25571 Pain in right ankle and joints of right foot: Secondary | ICD-10-CM | POA: Diagnosis not present

## 2023-11-28 DIAGNOSIS — M25572 Pain in left ankle and joints of left foot: Secondary | ICD-10-CM | POA: Diagnosis not present

## 2023-11-28 DIAGNOSIS — S93431D Sprain of tibiofibular ligament of right ankle, subsequent encounter: Secondary | ICD-10-CM | POA: Diagnosis not present

## 2023-11-28 DIAGNOSIS — S82872D Displaced pilon fracture of left tibia, subsequent encounter for closed fracture with routine healing: Secondary | ICD-10-CM | POA: Diagnosis not present

## 2023-11-28 DIAGNOSIS — M1712 Unilateral primary osteoarthritis, left knee: Secondary | ICD-10-CM | POA: Diagnosis not present

## 2023-11-29 ENCOUNTER — Ambulatory Visit (HOSPITAL_BASED_OUTPATIENT_CLINIC_OR_DEPARTMENT_OTHER): Payer: Commercial Managed Care - HMO | Admitting: Physical Therapy

## 2023-11-29 DIAGNOSIS — M1712 Unilateral primary osteoarthritis, left knee: Secondary | ICD-10-CM | POA: Diagnosis not present
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
## 2023-11-30 DIAGNOSIS — M1712 Unilateral primary osteoarthritis, left knee: Secondary | ICD-10-CM | POA: Diagnosis not present

## 2023-12-01 DIAGNOSIS — M1712 Unilateral primary osteoarthritis, left knee: Secondary | ICD-10-CM | POA: Diagnosis not present

## 2023-12-02 DIAGNOSIS — M1712 Unilateral primary osteoarthritis, left knee: Secondary | ICD-10-CM | POA: Diagnosis not present

## 2023-12-03 DIAGNOSIS — M1712 Unilateral primary osteoarthritis, left knee: Secondary | ICD-10-CM | POA: Diagnosis not present

## 2023-12-04 ENCOUNTER — Ambulatory Visit (HOSPITAL_BASED_OUTPATIENT_CLINIC_OR_DEPARTMENT_OTHER): Payer: Commercial Managed Care - HMO | Admitting: Physical Therapy

## 2023-12-04 ENCOUNTER — Encounter (HOSPITAL_BASED_OUTPATIENT_CLINIC_OR_DEPARTMENT_OTHER): Payer: Self-pay | Admitting: Physical Therapy

## 2023-12-04 DIAGNOSIS — M25571 Pain in right ankle and joints of right foot: Secondary | ICD-10-CM

## 2023-12-04 DIAGNOSIS — M25572 Pain in left ankle and joints of left foot: Secondary | ICD-10-CM | POA: Diagnosis not present

## 2023-12-04 DIAGNOSIS — M6281 Muscle weakness (generalized): Secondary | ICD-10-CM

## 2023-12-04 DIAGNOSIS — M1712 Unilateral primary osteoarthritis, left knee: Secondary | ICD-10-CM | POA: Diagnosis not present
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
## 2023-12-05 ENCOUNTER — Ambulatory Visit (HOSPITAL_BASED_OUTPATIENT_CLINIC_OR_DEPARTMENT_OTHER): Payer: Commercial Managed Care - HMO | Admitting: Physical Therapy

## 2023-12-05 DIAGNOSIS — M1712 Unilateral primary osteoarthritis, left knee: Secondary | ICD-10-CM | POA: Diagnosis not present
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
## 2023-12-06 DIAGNOSIS — M1712 Unilateral primary osteoarthritis, left knee: Secondary | ICD-10-CM | POA: Diagnosis not present

## 2023-12-07 DIAGNOSIS — M1712 Unilateral primary osteoarthritis, left knee: Secondary | ICD-10-CM | POA: Diagnosis not present

## 2023-12-08 DIAGNOSIS — M1712 Unilateral primary osteoarthritis, left knee: Secondary | ICD-10-CM | POA: Diagnosis not present

## 2023-12-09 DIAGNOSIS — M1712 Unilateral primary osteoarthritis, left knee: Secondary | ICD-10-CM | POA: Diagnosis not present

## 2023-12-10 DIAGNOSIS — M1712 Unilateral primary osteoarthritis, left knee: Secondary | ICD-10-CM | POA: Diagnosis not present

## 2023-12-11 DIAGNOSIS — M1712 Unilateral primary osteoarthritis, left knee: Secondary | ICD-10-CM | POA: Diagnosis not present

## 2023-12-12 DIAGNOSIS — M1712 Unilateral primary osteoarthritis, left knee: Secondary | ICD-10-CM | POA: Diagnosis not present

## 2023-12-13 ENCOUNTER — Encounter (HOSPITAL_BASED_OUTPATIENT_CLINIC_OR_DEPARTMENT_OTHER): Payer: Commercial Managed Care - HMO

## 2023-12-13 DIAGNOSIS — M1712 Unilateral primary osteoarthritis, left knee: Secondary | ICD-10-CM | POA: Diagnosis not present

## 2023-12-14 DIAGNOSIS — M1712 Unilateral primary osteoarthritis, left knee: Secondary | ICD-10-CM | POA: Diagnosis not present

## 2023-12-15 DIAGNOSIS — M1712 Unilateral primary osteoarthritis, left knee: Secondary | ICD-10-CM | POA: Diagnosis not present

## 2023-12-16 DIAGNOSIS — M1712 Unilateral primary osteoarthritis, left knee: Secondary | ICD-10-CM | POA: Diagnosis not present

## 2023-12-17 DIAGNOSIS — M1712 Unilateral primary osteoarthritis, left knee: Secondary | ICD-10-CM | POA: Diagnosis not present

## 2023-12-18 ENCOUNTER — Ambulatory Visit (HOSPITAL_BASED_OUTPATIENT_CLINIC_OR_DEPARTMENT_OTHER): Payer: Commercial Managed Care - HMO | Admitting: Physical Therapy

## 2023-12-18 DIAGNOSIS — M1712 Unilateral primary osteoarthritis, left knee: Secondary | ICD-10-CM | POA: Diagnosis not present

## 2023-12-19 DIAGNOSIS — M1712 Unilateral primary osteoarthritis, left knee: Secondary | ICD-10-CM | POA: Diagnosis not present

## 2023-12-20 ENCOUNTER — Encounter (HOSPITAL_BASED_OUTPATIENT_CLINIC_OR_DEPARTMENT_OTHER): Payer: Commercial Managed Care - HMO | Admitting: Physical Therapy

## 2023-12-20 DIAGNOSIS — M1712 Unilateral primary osteoarthritis, left knee: Secondary | ICD-10-CM | POA: Diagnosis not present

## 2023-12-21 ENCOUNTER — Ambulatory Visit (HOSPITAL_BASED_OUTPATIENT_CLINIC_OR_DEPARTMENT_OTHER): Payer: Self-pay | Attending: Orthopedic Surgery | Admitting: Physical Therapy

## 2023-12-21 ENCOUNTER — Encounter (HOSPITAL_BASED_OUTPATIENT_CLINIC_OR_DEPARTMENT_OTHER): Payer: Self-pay | Admitting: Physical Therapy

## 2023-12-21 DIAGNOSIS — M1712 Unilateral primary osteoarthritis, left knee: Secondary | ICD-10-CM | POA: Diagnosis not present

## 2023-12-21 DIAGNOSIS — M5459 Other low back pain: Secondary | ICD-10-CM | POA: Diagnosis not present

## 2023-12-21 DIAGNOSIS — R2689 Other abnormalities of gait and mobility: Secondary | ICD-10-CM | POA: Diagnosis not present

## 2023-12-21 DIAGNOSIS — M6281 Muscle weakness (generalized): Secondary | ICD-10-CM | POA: Insufficient documentation

## 2023-12-21 DIAGNOSIS — M25571 Pain in right ankle and joints of right foot: Secondary | ICD-10-CM | POA: Diagnosis not present

## 2023-12-21 DIAGNOSIS — M25572 Pain in left ankle and joints of left foot: Secondary | ICD-10-CM | POA: Diagnosis not present
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
## 2023-12-22 DIAGNOSIS — M1712 Unilateral primary osteoarthritis, left knee: Secondary | ICD-10-CM | POA: Diagnosis not present

## 2023-12-23 DIAGNOSIS — M1712 Unilateral primary osteoarthritis, left knee: Secondary | ICD-10-CM | POA: Diagnosis not present

## 2023-12-24 DIAGNOSIS — M1712 Unilateral primary osteoarthritis, left knee: Secondary | ICD-10-CM | POA: Diagnosis not present

## 2023-12-25 ENCOUNTER — Ambulatory Visit (HOSPITAL_BASED_OUTPATIENT_CLINIC_OR_DEPARTMENT_OTHER): Payer: Commercial Managed Care - HMO | Admitting: Physical Therapy

## 2023-12-25 DIAGNOSIS — M1712 Unilateral primary osteoarthritis, left knee: Secondary | ICD-10-CM | POA: Diagnosis not present

## 2023-12-25 DIAGNOSIS — M25571 Pain in right ankle and joints of right foot: Secondary | ICD-10-CM

## 2023-12-25 DIAGNOSIS — R2689 Other abnormalities of gait and mobility: Secondary | ICD-10-CM

## 2023-12-25 DIAGNOSIS — M6281 Muscle weakness (generalized): Secondary | ICD-10-CM | POA: Diagnosis not present

## 2023-12-25 DIAGNOSIS — M25572 Pain in left ankle and joints of left foot: Secondary | ICD-10-CM | POA: Diagnosis not present

## 2023-12-25 DIAGNOSIS — M5459 Other low back pain: Secondary | ICD-10-CM | POA: Diagnosis not present
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
## 2023-12-26 ENCOUNTER — Encounter (HOSPITAL_BASED_OUTPATIENT_CLINIC_OR_DEPARTMENT_OTHER): Payer: Self-pay | Admitting: Physical Therapy

## 2023-12-26 DIAGNOSIS — M1712 Unilateral primary osteoarthritis, left knee: Secondary | ICD-10-CM | POA: Diagnosis not present

## 2023-12-27 DIAGNOSIS — M1712 Unilateral primary osteoarthritis, left knee: Secondary | ICD-10-CM | POA: Diagnosis not present

## 2023-12-28 DIAGNOSIS — M1712 Unilateral primary osteoarthritis, left knee: Secondary | ICD-10-CM | POA: Diagnosis not present

## 2023-12-29 DIAGNOSIS — M1712 Unilateral primary osteoarthritis, left knee: Secondary | ICD-10-CM | POA: Diagnosis not present

## 2023-12-30 DIAGNOSIS — M1712 Unilateral primary osteoarthritis, left knee: Secondary | ICD-10-CM | POA: Diagnosis not present

## 2023-12-31 DIAGNOSIS — M1712 Unilateral primary osteoarthritis, left knee: Secondary | ICD-10-CM | POA: Diagnosis not present

## 2024-01-01 DIAGNOSIS — M1712 Unilateral primary osteoarthritis, left knee: Secondary | ICD-10-CM | POA: Diagnosis not present

## 2024-01-02 DIAGNOSIS — M1712 Unilateral primary osteoarthritis, left knee: Secondary | ICD-10-CM | POA: Diagnosis not present

## 2024-01-03 DIAGNOSIS — M1712 Unilateral primary osteoarthritis, left knee: Secondary | ICD-10-CM | POA: Diagnosis not present

## 2024-01-04 DIAGNOSIS — M1712 Unilateral primary osteoarthritis, left knee: Secondary | ICD-10-CM | POA: Diagnosis not present

## 2024-01-05 DIAGNOSIS — M1712 Unilateral primary osteoarthritis, left knee: Secondary | ICD-10-CM | POA: Diagnosis not present

## 2024-01-06 DIAGNOSIS — M1712 Unilateral primary osteoarthritis, left knee: Secondary | ICD-10-CM | POA: Diagnosis not present

## 2024-01-07 DIAGNOSIS — M1712 Unilateral primary osteoarthritis, left knee: Secondary | ICD-10-CM | POA: Diagnosis not present

## 2024-01-08 ENCOUNTER — Ambulatory Visit (HOSPITAL_BASED_OUTPATIENT_CLINIC_OR_DEPARTMENT_OTHER): Payer: Commercial Managed Care - HMO | Admitting: Physical Therapy

## 2024-01-08 DIAGNOSIS — M1712 Unilateral primary osteoarthritis, left knee: Secondary | ICD-10-CM | POA: Diagnosis not present

## 2024-01-08 DIAGNOSIS — E669 Obesity, unspecified: Secondary | ICD-10-CM | POA: Diagnosis not present

## 2024-01-08 DIAGNOSIS — J309 Allergic rhinitis, unspecified: Secondary | ICD-10-CM | POA: Diagnosis not present

## 2024-01-08 DIAGNOSIS — I10 Essential (primary) hypertension: Secondary | ICD-10-CM | POA: Diagnosis not present

## 2024-01-09 DIAGNOSIS — M25571 Pain in right ankle and joints of right foot: Secondary | ICD-10-CM | POA: Diagnosis not present

## 2024-01-09 DIAGNOSIS — M25572 Pain in left ankle and joints of left foot: Secondary | ICD-10-CM | POA: Diagnosis not present

## 2024-01-09 DIAGNOSIS — S82872D Displaced pilon fracture of left tibia, subsequent encounter for closed fracture with routine healing: Secondary | ICD-10-CM | POA: Diagnosis not present

## 2024-01-09 DIAGNOSIS — S93431D Sprain of tibiofibular ligament of right ankle, subsequent encounter: Secondary | ICD-10-CM | POA: Diagnosis not present

## 2024-01-09 DIAGNOSIS — M1712 Unilateral primary osteoarthritis, left knee: Secondary | ICD-10-CM | POA: Diagnosis not present

## 2024-01-10 ENCOUNTER — Encounter (HOSPITAL_BASED_OUTPATIENT_CLINIC_OR_DEPARTMENT_OTHER): Payer: Self-pay | Admitting: Physical Therapy

## 2024-01-10 ENCOUNTER — Encounter (HOSPITAL_BASED_OUTPATIENT_CLINIC_OR_DEPARTMENT_OTHER): Payer: Commercial Managed Care - HMO

## 2024-01-10 ENCOUNTER — Ambulatory Visit (HOSPITAL_BASED_OUTPATIENT_CLINIC_OR_DEPARTMENT_OTHER): Payer: Commercial Managed Care - HMO | Attending: Orthopedic Surgery | Admitting: Physical Therapy

## 2024-01-10 DIAGNOSIS — M25571 Pain in right ankle and joints of right foot: Secondary | ICD-10-CM | POA: Insufficient documentation

## 2024-01-10 DIAGNOSIS — M6281 Muscle weakness (generalized): Secondary | ICD-10-CM | POA: Insufficient documentation

## 2024-01-10 DIAGNOSIS — M25572 Pain in left ankle and joints of left foot: Secondary | ICD-10-CM | POA: Insufficient documentation

## 2024-01-10 DIAGNOSIS — M1712 Unilateral primary osteoarthritis, left knee: Secondary | ICD-10-CM | POA: Diagnosis not present

## 2024-01-10 DIAGNOSIS — R2689 Other abnormalities of gait and mobility: Secondary | ICD-10-CM | POA: Insufficient documentation
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
## 2024-01-11 DIAGNOSIS — M1712 Unilateral primary osteoarthritis, left knee: Secondary | ICD-10-CM | POA: Diagnosis not present

## 2024-01-12 DIAGNOSIS — M1712 Unilateral primary osteoarthritis, left knee: Secondary | ICD-10-CM | POA: Diagnosis not present

## 2024-01-13 DIAGNOSIS — M1712 Unilateral primary osteoarthritis, left knee: Secondary | ICD-10-CM | POA: Diagnosis not present

## 2024-01-14 DIAGNOSIS — M1712 Unilateral primary osteoarthritis, left knee: Secondary | ICD-10-CM | POA: Diagnosis not present

## 2024-01-15 ENCOUNTER — Ambulatory Visit (HOSPITAL_BASED_OUTPATIENT_CLINIC_OR_DEPARTMENT_OTHER): Payer: Commercial Managed Care - HMO | Admitting: Physical Therapy

## 2024-01-15 DIAGNOSIS — M1712 Unilateral primary osteoarthritis, left knee: Secondary | ICD-10-CM | POA: Diagnosis not present

## 2024-01-16 DIAGNOSIS — M1712 Unilateral primary osteoarthritis, left knee: Secondary | ICD-10-CM | POA: Diagnosis not present

## 2024-01-17 ENCOUNTER — Encounter (HOSPITAL_BASED_OUTPATIENT_CLINIC_OR_DEPARTMENT_OTHER): Payer: Commercial Managed Care - HMO

## 2024-01-17 DIAGNOSIS — M1712 Unilateral primary osteoarthritis, left knee: Secondary | ICD-10-CM | POA: Diagnosis not present

## 2024-01-18 DIAGNOSIS — M1712 Unilateral primary osteoarthritis, left knee: Secondary | ICD-10-CM | POA: Diagnosis not present

## 2024-01-19 DIAGNOSIS — M1712 Unilateral primary osteoarthritis, left knee: Secondary | ICD-10-CM | POA: Diagnosis not present

## 2024-01-20 DIAGNOSIS — M1712 Unilateral primary osteoarthritis, left knee: Secondary | ICD-10-CM | POA: Diagnosis not present

## 2024-01-21 DIAGNOSIS — M1712 Unilateral primary osteoarthritis, left knee: Secondary | ICD-10-CM | POA: Diagnosis not present

## 2024-01-22 ENCOUNTER — Ambulatory Visit (HOSPITAL_BASED_OUTPATIENT_CLINIC_OR_DEPARTMENT_OTHER): Payer: Commercial Managed Care - HMO | Admitting: Physical Therapy

## 2024-01-22 DIAGNOSIS — M1712 Unilateral primary osteoarthritis, left knee: Secondary | ICD-10-CM | POA: Diagnosis not present

## 2024-01-23 DIAGNOSIS — M1712 Unilateral primary osteoarthritis, left knee: Secondary | ICD-10-CM | POA: Diagnosis not present

## 2024-01-24 ENCOUNTER — Encounter (HOSPITAL_BASED_OUTPATIENT_CLINIC_OR_DEPARTMENT_OTHER): Payer: Commercial Managed Care - HMO

## 2024-01-24 ENCOUNTER — Ambulatory Visit (HOSPITAL_BASED_OUTPATIENT_CLINIC_OR_DEPARTMENT_OTHER): Payer: Self-pay | Admitting: Physical Therapy

## 2024-01-24 ENCOUNTER — Encounter (HOSPITAL_BASED_OUTPATIENT_CLINIC_OR_DEPARTMENT_OTHER): Payer: Self-pay | Admitting: Physical Therapy

## 2024-01-24 DIAGNOSIS — M25571 Pain in right ankle and joints of right foot: Secondary | ICD-10-CM

## 2024-01-24 DIAGNOSIS — M25572 Pain in left ankle and joints of left foot: Secondary | ICD-10-CM | POA: Diagnosis not present

## 2024-01-24 DIAGNOSIS — M6281 Muscle weakness (generalized): Secondary | ICD-10-CM

## 2024-01-24 DIAGNOSIS — R2689 Other abnormalities of gait and mobility: Secondary | ICD-10-CM | POA: Diagnosis not present

## 2024-01-24 DIAGNOSIS — M1712 Unilateral primary osteoarthritis, left knee: Secondary | ICD-10-CM | POA: Diagnosis not present
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
## 2024-01-25 DIAGNOSIS — M1712 Unilateral primary osteoarthritis, left knee: Secondary | ICD-10-CM | POA: Diagnosis not present

## 2024-01-26 DIAGNOSIS — M1712 Unilateral primary osteoarthritis, left knee: Secondary | ICD-10-CM | POA: Diagnosis not present

## 2024-01-27 DIAGNOSIS — M1712 Unilateral primary osteoarthritis, left knee: Secondary | ICD-10-CM | POA: Diagnosis not present

## 2024-01-28 DIAGNOSIS — M1712 Unilateral primary osteoarthritis, left knee: Secondary | ICD-10-CM | POA: Diagnosis not present

## 2024-01-29 ENCOUNTER — Ambulatory Visit (HOSPITAL_BASED_OUTPATIENT_CLINIC_OR_DEPARTMENT_OTHER): Payer: Commercial Managed Care - HMO | Admitting: Physical Therapy

## 2024-01-29 DIAGNOSIS — M1712 Unilateral primary osteoarthritis, left knee: Secondary | ICD-10-CM | POA: Diagnosis not present

## 2024-01-30 DIAGNOSIS — M1712 Unilateral primary osteoarthritis, left knee: Secondary | ICD-10-CM | POA: Diagnosis not present

## 2024-01-31 ENCOUNTER — Encounter (HOSPITAL_BASED_OUTPATIENT_CLINIC_OR_DEPARTMENT_OTHER): Payer: Commercial Managed Care - HMO

## 2024-01-31 DIAGNOSIS — M1712 Unilateral primary osteoarthritis, left knee: Secondary | ICD-10-CM | POA: Diagnosis not present

## 2024-02-01 DIAGNOSIS — M1712 Unilateral primary osteoarthritis, left knee: Secondary | ICD-10-CM | POA: Diagnosis not present

## 2024-02-02 DIAGNOSIS — M1712 Unilateral primary osteoarthritis, left knee: Secondary | ICD-10-CM | POA: Diagnosis not present

## 2024-02-03 DIAGNOSIS — M1712 Unilateral primary osteoarthritis, left knee: Secondary | ICD-10-CM | POA: Diagnosis not present

## 2024-02-04 DIAGNOSIS — M1712 Unilateral primary osteoarthritis, left knee: Secondary | ICD-10-CM | POA: Diagnosis not present

## 2024-02-05 ENCOUNTER — Ambulatory Visit (HOSPITAL_BASED_OUTPATIENT_CLINIC_OR_DEPARTMENT_OTHER): Payer: Commercial Managed Care - HMO | Admitting: Physical Therapy

## 2024-02-05 DIAGNOSIS — M1712 Unilateral primary osteoarthritis, left knee: Secondary | ICD-10-CM | POA: Diagnosis not present

## 2024-02-06 DIAGNOSIS — M1712 Unilateral primary osteoarthritis, left knee: Secondary | ICD-10-CM | POA: Diagnosis not present

## 2024-02-07 ENCOUNTER — Encounter (HOSPITAL_BASED_OUTPATIENT_CLINIC_OR_DEPARTMENT_OTHER): Payer: Commercial Managed Care - HMO

## 2024-02-07 DIAGNOSIS — M1712 Unilateral primary osteoarthritis, left knee: Secondary | ICD-10-CM | POA: Diagnosis not present

## 2024-02-08 DIAGNOSIS — M1712 Unilateral primary osteoarthritis, left knee: Secondary | ICD-10-CM | POA: Diagnosis not present

## 2024-02-09 DIAGNOSIS — M1712 Unilateral primary osteoarthritis, left knee: Secondary | ICD-10-CM | POA: Diagnosis not present

## 2024-02-10 DIAGNOSIS — M1712 Unilateral primary osteoarthritis, left knee: Secondary | ICD-10-CM | POA: Diagnosis not present

## 2024-02-12 ENCOUNTER — Ambulatory Visit (HOSPITAL_BASED_OUTPATIENT_CLINIC_OR_DEPARTMENT_OTHER): Payer: Commercial Managed Care - HMO | Admitting: Physical Therapy

## 2024-02-14 ENCOUNTER — Encounter (HOSPITAL_BASED_OUTPATIENT_CLINIC_OR_DEPARTMENT_OTHER): Payer: Commercial Managed Care - HMO | Admitting: Physical Therapy

## 2024-02-15 DIAGNOSIS — M1712 Unilateral primary osteoarthritis, left knee: Secondary | ICD-10-CM | POA: Diagnosis not present

## 2024-02-16 DIAGNOSIS — M1712 Unilateral primary osteoarthritis, left knee: Secondary | ICD-10-CM | POA: Diagnosis not present

## 2024-02-17 DIAGNOSIS — M1712 Unilateral primary osteoarthritis, left knee: Secondary | ICD-10-CM | POA: Diagnosis not present

## 2024-02-18 DIAGNOSIS — M1712 Unilateral primary osteoarthritis, left knee: Secondary | ICD-10-CM | POA: Diagnosis not present

## 2024-02-19 DIAGNOSIS — M1712 Unilateral primary osteoarthritis, left knee: Secondary | ICD-10-CM | POA: Diagnosis not present

## 2024-02-20 DIAGNOSIS — M1712 Unilateral primary osteoarthritis, left knee: Secondary | ICD-10-CM | POA: Diagnosis not present

## 2024-02-21 DIAGNOSIS — M1712 Unilateral primary osteoarthritis, left knee: Secondary | ICD-10-CM | POA: Diagnosis not present

## 2024-02-22 DIAGNOSIS — M1712 Unilateral primary osteoarthritis, left knee: Secondary | ICD-10-CM | POA: Diagnosis not present

## 2024-02-23 DIAGNOSIS — M1712 Unilateral primary osteoarthritis, left knee: Secondary | ICD-10-CM | POA: Diagnosis not present

## 2024-02-24 DIAGNOSIS — M1712 Unilateral primary osteoarthritis, left knee: Secondary | ICD-10-CM | POA: Diagnosis not present

## 2024-02-25 DIAGNOSIS — M1712 Unilateral primary osteoarthritis, left knee: Secondary | ICD-10-CM | POA: Diagnosis not present

## 2024-02-26 DIAGNOSIS — M1712 Unilateral primary osteoarthritis, left knee: Secondary | ICD-10-CM | POA: Diagnosis not present

## 2024-02-27 DIAGNOSIS — M1712 Unilateral primary osteoarthritis, left knee: Secondary | ICD-10-CM | POA: Diagnosis not present

## 2024-02-28 DIAGNOSIS — M1712 Unilateral primary osteoarthritis, left knee: Secondary | ICD-10-CM | POA: Diagnosis not present

## 2024-02-29 DIAGNOSIS — M1712 Unilateral primary osteoarthritis, left knee: Secondary | ICD-10-CM | POA: Diagnosis not present

## 2024-03-01 DIAGNOSIS — M1712 Unilateral primary osteoarthritis, left knee: Secondary | ICD-10-CM | POA: Diagnosis not present

## 2024-03-02 DIAGNOSIS — M1712 Unilateral primary osteoarthritis, left knee: Secondary | ICD-10-CM | POA: Diagnosis not present

## 2024-03-03 DIAGNOSIS — M1712 Unilateral primary osteoarthritis, left knee: Secondary | ICD-10-CM | POA: Diagnosis not present

## 2024-03-04 DIAGNOSIS — M1712 Unilateral primary osteoarthritis, left knee: Secondary | ICD-10-CM | POA: Diagnosis not present

## 2024-03-05 DIAGNOSIS — M1712 Unilateral primary osteoarthritis, left knee: Secondary | ICD-10-CM | POA: Diagnosis not present

## 2024-03-06 DIAGNOSIS — M1712 Unilateral primary osteoarthritis, left knee: Secondary | ICD-10-CM | POA: Diagnosis not present

## 2024-03-07 DIAGNOSIS — M1712 Unilateral primary osteoarthritis, left knee: Secondary | ICD-10-CM | POA: Diagnosis not present

## 2024-03-08 DIAGNOSIS — M1712 Unilateral primary osteoarthritis, left knee: Secondary | ICD-10-CM | POA: Diagnosis not present

## 2024-03-09 DIAGNOSIS — M1712 Unilateral primary osteoarthritis, left knee: Secondary | ICD-10-CM | POA: Diagnosis not present

## 2024-03-10 DIAGNOSIS — M1712 Unilateral primary osteoarthritis, left knee: Secondary | ICD-10-CM | POA: Diagnosis not present

## 2024-03-11 DIAGNOSIS — M1712 Unilateral primary osteoarthritis, left knee: Secondary | ICD-10-CM | POA: Diagnosis not present

## 2024-03-12 DIAGNOSIS — M1712 Unilateral primary osteoarthritis, left knee: Secondary | ICD-10-CM | POA: Diagnosis not present

## 2024-03-13 DIAGNOSIS — M1712 Unilateral primary osteoarthritis, left knee: Secondary | ICD-10-CM | POA: Diagnosis not present

## 2024-03-14 DIAGNOSIS — M1712 Unilateral primary osteoarthritis, left knee: Secondary | ICD-10-CM | POA: Diagnosis not present

## 2024-03-15 DIAGNOSIS — M1712 Unilateral primary osteoarthritis, left knee: Secondary | ICD-10-CM | POA: Diagnosis not present

## 2024-03-16 DIAGNOSIS — M1712 Unilateral primary osteoarthritis, left knee: Secondary | ICD-10-CM | POA: Diagnosis not present

## 2024-03-17 DIAGNOSIS — M1712 Unilateral primary osteoarthritis, left knee: Secondary | ICD-10-CM | POA: Diagnosis not present

## 2024-03-18 DIAGNOSIS — M1712 Unilateral primary osteoarthritis, left knee: Secondary | ICD-10-CM | POA: Diagnosis not present

## 2024-03-19 DIAGNOSIS — M1712 Unilateral primary osteoarthritis, left knee: Secondary | ICD-10-CM | POA: Diagnosis not present

## 2024-03-20 DIAGNOSIS — M1712 Unilateral primary osteoarthritis, left knee: Secondary | ICD-10-CM | POA: Diagnosis not present

## 2024-03-21 DIAGNOSIS — M1712 Unilateral primary osteoarthritis, left knee: Secondary | ICD-10-CM | POA: Diagnosis not present

## 2024-03-22 DIAGNOSIS — M1712 Unilateral primary osteoarthritis, left knee: Secondary | ICD-10-CM | POA: Diagnosis not present

## 2024-03-23 DIAGNOSIS — M1712 Unilateral primary osteoarthritis, left knee: Secondary | ICD-10-CM | POA: Diagnosis not present

## 2024-03-24 DIAGNOSIS — M1712 Unilateral primary osteoarthritis, left knee: Secondary | ICD-10-CM | POA: Diagnosis not present

## 2024-03-25 DIAGNOSIS — M1712 Unilateral primary osteoarthritis, left knee: Secondary | ICD-10-CM | POA: Diagnosis not present

## 2024-03-26 DIAGNOSIS — M1712 Unilateral primary osteoarthritis, left knee: Secondary | ICD-10-CM | POA: Diagnosis not present

## 2024-03-27 DIAGNOSIS — M1712 Unilateral primary osteoarthritis, left knee: Secondary | ICD-10-CM | POA: Diagnosis not present

## 2024-03-28 DIAGNOSIS — M1712 Unilateral primary osteoarthritis, left knee: Secondary | ICD-10-CM | POA: Diagnosis not present

## 2024-03-29 DIAGNOSIS — M1712 Unilateral primary osteoarthritis, left knee: Secondary | ICD-10-CM | POA: Diagnosis not present

## 2024-03-30 DIAGNOSIS — M1712 Unilateral primary osteoarthritis, left knee: Secondary | ICD-10-CM | POA: Diagnosis not present

## 2024-03-31 DIAGNOSIS — M1712 Unilateral primary osteoarthritis, left knee: Secondary | ICD-10-CM | POA: Diagnosis not present

## 2024-04-01 DIAGNOSIS — M1712 Unilateral primary osteoarthritis, left knee: Secondary | ICD-10-CM | POA: Diagnosis not present

## 2024-04-02 DIAGNOSIS — M1712 Unilateral primary osteoarthritis, left knee: Secondary | ICD-10-CM | POA: Diagnosis not present

## 2024-04-03 DIAGNOSIS — M1712 Unilateral primary osteoarthritis, left knee: Secondary | ICD-10-CM | POA: Diagnosis not present

## 2024-04-04 DIAGNOSIS — M1712 Unilateral primary osteoarthritis, left knee: Secondary | ICD-10-CM | POA: Diagnosis not present

## 2024-04-05 DIAGNOSIS — M1712 Unilateral primary osteoarthritis, left knee: Secondary | ICD-10-CM | POA: Diagnosis not present

## 2024-04-06 DIAGNOSIS — M1712 Unilateral primary osteoarthritis, left knee: Secondary | ICD-10-CM | POA: Diagnosis not present

## 2024-04-07 DIAGNOSIS — M1712 Unilateral primary osteoarthritis, left knee: Secondary | ICD-10-CM | POA: Diagnosis not present

## 2024-04-08 DIAGNOSIS — M1712 Unilateral primary osteoarthritis, left knee: Secondary | ICD-10-CM | POA: Diagnosis not present

## 2024-04-09 DIAGNOSIS — M1712 Unilateral primary osteoarthritis, left knee: Secondary | ICD-10-CM | POA: Diagnosis not present

## 2024-04-10 DIAGNOSIS — M1712 Unilateral primary osteoarthritis, left knee: Secondary | ICD-10-CM | POA: Diagnosis not present

## 2024-04-11 DIAGNOSIS — M1712 Unilateral primary osteoarthritis, left knee: Secondary | ICD-10-CM | POA: Diagnosis not present

## 2024-04-12 DIAGNOSIS — M1712 Unilateral primary osteoarthritis, left knee: Secondary | ICD-10-CM | POA: Diagnosis not present

## 2024-04-13 DIAGNOSIS — M1712 Unilateral primary osteoarthritis, left knee: Secondary | ICD-10-CM | POA: Diagnosis not present

## 2024-04-14 DIAGNOSIS — M1712 Unilateral primary osteoarthritis, left knee: Secondary | ICD-10-CM | POA: Diagnosis not present

## 2024-04-15 DIAGNOSIS — M1712 Unilateral primary osteoarthritis, left knee: Secondary | ICD-10-CM | POA: Diagnosis not present

## 2024-04-16 DIAGNOSIS — M1712 Unilateral primary osteoarthritis, left knee: Secondary | ICD-10-CM | POA: Diagnosis not present

## 2024-04-17 DIAGNOSIS — M1712 Unilateral primary osteoarthritis, left knee: Secondary | ICD-10-CM | POA: Diagnosis not present

## 2024-04-18 DIAGNOSIS — M1712 Unilateral primary osteoarthritis, left knee: Secondary | ICD-10-CM | POA: Diagnosis not present

## 2024-04-19 DIAGNOSIS — M1712 Unilateral primary osteoarthritis, left knee: Secondary | ICD-10-CM | POA: Diagnosis not present

## 2024-04-20 DIAGNOSIS — M1712 Unilateral primary osteoarthritis, left knee: Secondary | ICD-10-CM | POA: Diagnosis not present

## 2024-04-21 DIAGNOSIS — M1712 Unilateral primary osteoarthritis, left knee: Secondary | ICD-10-CM | POA: Diagnosis not present

## 2024-04-22 DIAGNOSIS — M1712 Unilateral primary osteoarthritis, left knee: Secondary | ICD-10-CM | POA: Diagnosis not present

## 2024-04-23 DIAGNOSIS — M1712 Unilateral primary osteoarthritis, left knee: Secondary | ICD-10-CM | POA: Diagnosis not present

## 2024-04-24 DIAGNOSIS — M1712 Unilateral primary osteoarthritis, left knee: Secondary | ICD-10-CM | POA: Diagnosis not present

## 2024-04-25 DIAGNOSIS — M1712 Unilateral primary osteoarthritis, left knee: Secondary | ICD-10-CM | POA: Diagnosis not present

## 2024-04-26 DIAGNOSIS — M1712 Unilateral primary osteoarthritis, left knee: Secondary | ICD-10-CM | POA: Diagnosis not present

## 2024-04-27 DIAGNOSIS — M1712 Unilateral primary osteoarthritis, left knee: Secondary | ICD-10-CM | POA: Diagnosis not present

## 2024-04-28 DIAGNOSIS — M1712 Unilateral primary osteoarthritis, left knee: Secondary | ICD-10-CM | POA: Diagnosis not present

## 2024-04-29 DIAGNOSIS — M1712 Unilateral primary osteoarthritis, left knee: Secondary | ICD-10-CM | POA: Diagnosis not present

## 2024-04-30 ENCOUNTER — Emergency Department (HOSPITAL_COMMUNITY)

## 2024-04-30 ENCOUNTER — Emergency Department (HOSPITAL_COMMUNITY)
Admission: EM | Admit: 2024-04-30 | Discharge: 2024-04-30 | Disposition: A | Discharge status: Home / Self Care | Attending: Emergency Medicine | Admitting: Emergency Medicine

## 2024-04-30 ENCOUNTER — Encounter (HOSPITAL_COMMUNITY): Payer: Self-pay

## 2024-04-30 DIAGNOSIS — Z9049 Acquired absence of other specified parts of digestive tract: Secondary | ICD-10-CM | POA: Diagnosis not present

## 2024-04-30 DIAGNOSIS — Z7901 Long term (current) use of anticoagulants: Secondary | ICD-10-CM | POA: Insufficient documentation

## 2024-04-30 DIAGNOSIS — Z7982 Long term (current) use of aspirin: Secondary | ICD-10-CM | POA: Insufficient documentation

## 2024-04-30 DIAGNOSIS — R112 Nausea with vomiting, unspecified: Secondary | ICD-10-CM | POA: Diagnosis not present

## 2024-04-30 DIAGNOSIS — Z79899 Other long term (current) drug therapy: Secondary | ICD-10-CM | POA: Diagnosis not present

## 2024-04-30 DIAGNOSIS — R110 Nausea: Secondary | ICD-10-CM | POA: Diagnosis not present

## 2024-04-30 DIAGNOSIS — R079 Chest pain, unspecified: Secondary | ICD-10-CM | POA: Diagnosis not present

## 2024-04-30 DIAGNOSIS — R0789 Other chest pain: Secondary | ICD-10-CM | POA: Diagnosis not present

## 2024-04-30 DIAGNOSIS — I10 Essential (primary) hypertension: Secondary | ICD-10-CM | POA: Insufficient documentation

## 2024-04-30 DIAGNOSIS — R109 Unspecified abdominal pain: Secondary | ICD-10-CM | POA: Diagnosis not present

## 2024-04-30 DIAGNOSIS — K5730 Diverticulosis of large intestine without perforation or abscess without bleeding: Secondary | ICD-10-CM | POA: Diagnosis not present

## 2024-04-30 DIAGNOSIS — R55 Syncope and collapse: Secondary | ICD-10-CM | POA: Diagnosis not present

## 2024-04-30 DIAGNOSIS — R1084 Generalized abdominal pain: Secondary | ICD-10-CM | POA: Insufficient documentation

## 2024-04-30 DIAGNOSIS — R1111 Vomiting without nausea: Secondary | ICD-10-CM | POA: Diagnosis not present

## 2024-04-30 DIAGNOSIS — M1712 Unilateral primary osteoarthritis, left knee: Secondary | ICD-10-CM | POA: Diagnosis not present

## 2024-04-30 LAB — COMPREHENSIVE METABOLIC PANEL WITH GFR
ALT: 9 U/L (ref 0–44)
AST: 16 U/L (ref 15–41)
Albumin: 3.5 g/dL (ref 3.5–5.0)
Alkaline Phosphatase: 73 U/L (ref 38–126)
Anion gap: 14 (ref 5–15)
BUN: 9 mg/dL (ref 6–20)
CO2: 21 mmol/L — ABNORMAL LOW (ref 22–32)
Calcium: 9 mg/dL (ref 8.9–10.3)
Chloride: 104 mmol/L (ref 98–111)
Creatinine, Ser: 0.71 mg/dL (ref 0.44–1.00)
GFR, Estimated: >60 mL/min (ref >60)
Glucose, Bld: 136 mg/dL — ABNORMAL HIGH (ref 70–99)
Potassium: 4 mmol/L (ref 3.5–5.1)
Sodium: 139 mmol/L (ref 135–145)
Total Bilirubin: 1.5 mg/dL — ABNORMAL HIGH (ref 0.0–1.2)
Total Protein: 7.7 g/dL (ref 6.5–8.1)

## 2024-04-30 LAB — URINALYSIS, ROUTINE W REFLEX MICROSCOPIC
Bacteria, UA: NONE SEEN
Bilirubin Urine: NEGATIVE
Glucose, UA: NEGATIVE mg/dL
Hgb urine dipstick: NEGATIVE
Ketones, ur: 80 mg/dL — AB
Nitrite: NEGATIVE
Protein, ur: 100 mg/dL — AB
Specific Gravity, Urine: 1.026 (ref 1.005–1.030)
pH: 8 (ref 5.0–8.0)

## 2024-04-30 LAB — CBC
HCT: 42.9 % (ref 36.0–46.0)
Hemoglobin: 14.5 g/dL (ref 12.0–15.0)
MCH: 31.4 pg (ref 26.0–34.0)
MCHC: 33.8 g/dL (ref 30.0–36.0)
MCV: 92.9 fL (ref 80.0–100.0)
Platelets: 294 10*3/uL (ref 150–400)
RBC: 4.62 MIL/uL (ref 3.87–5.11)
RDW: 13.1 % (ref 11.5–15.5)
WBC: 10.7 10*3/uL — ABNORMAL HIGH (ref 4.0–10.5)
nRBC: 0 % (ref 0.0–0.2)

## 2024-04-30 LAB — TROPONIN I (HIGH SENSITIVITY)
Troponin I (High Sensitivity): 4 ng/L (ref <18)
Troponin I (High Sensitivity): 3 ng/L (ref <18)

## 2024-04-30 LAB — LIPASE, BLOOD: Lipase: 27 U/L (ref 11–51)

## 2024-04-30 MED ORDER — ONDANSETRON HCL 4 MG PO TABS: 4.0000 mg | ORAL_TABLET | Freq: Four times a day (QID) | ORAL | 0 refills | Status: DC

## 2024-04-30 MED ORDER — ONDANSETRON 4 MG PO TBDP
4.0000 mg | ORAL_TABLET | Freq: Once | ORAL | Status: AC
  Administered 2024-04-30: 4 mg via ORAL
  Filled 2024-04-30: qty 1

## 2024-04-30 MED ORDER — MORPHINE SULFATE (PF) 2 MG/ML IV SOLN
2.0000 mg | Freq: Once | INTRAVENOUS | Status: AC
  Administered 2024-04-30: 2 mg via INTRAVENOUS
  Filled 2024-04-30: qty 1

## 2024-04-30 MED ORDER — SODIUM CHLORIDE 0.9 % IV BOLUS
500.0000 mL | Freq: Once | INTRAVENOUS | Status: AC
  Administered 2024-04-30: 500 mL via INTRAVENOUS

## 2024-04-30 MED ORDER — ONDANSETRON HCL 4 MG/2ML IJ SOLN
4.0000 mg | Freq: Once | INTRAMUSCULAR | Status: AC
  Administered 2024-04-30: 4 mg via INTRAVENOUS
  Filled 2024-04-30: qty 2

## 2024-04-30 NOTE — ED Notes (Signed)
 Lab to add on troponin to previously collected

## 2024-04-30 NOTE — Discharge Instructions (Addendum)
 It was a pleasure taking care of you today.  Based on your history, physical exam, labs, and imaging I feel you are safe for discharge.  Today you have been prescribed Zofran  for nausea, please take as prescribed.  Please continue to monitor your symptoms at home, please return to the Emergency Department or seek further medical care if you experience any of the following symptoms including but not limited to fever, chills, shortness of breath, chest pain, severe nausea/vomiting or abdominal pain.  Please follow-up with your primary care and specialist as scheduled.

## 2024-04-30 NOTE — ED Notes (Signed)
Patient up to restroom for urine sample

## 2024-04-30 NOTE — ED Triage Notes (Signed)
 Pt to ED via [facility] from home c/o abdominal pain n/v that started this morning.   #18RAC, 4mg  zofran  given by EMS.  Last VS: 134/77, 65HR, 18RR, 100%RA. CBG 120.

## 2024-04-30 NOTE — ED Provider Notes (Signed)
 [facility] AT [facility] Provider Note   CSN: [id number] Arrival date & time: 04/30/24  1733     Patient presents with: Abdominal Pain and Emesis   Katie [name] is a 58 y.o. female who presents emergency department with a chief complaint of nausea, vomiting, and abdominal pain since this morning.  Patient states that approximately 930 to 10 AM this morning she began to become nauseous and started vomiting.  Patient states she has thrown up approximately 8-9 times today.  Denies blood in vomit.  Patient also states that she does have some mild chest discomfort with a feeling of pressure on her chest.  Denies shortness of breath.  Last fever, chills.  Patient has past medical history significant for bipolar 1 disorder, depression, hypertension, IBS, chronic pain, pulmonary embolism on anticoagulant therapy, etc. Of note patient has had a prior cholecystectomy.  {Add pertinent medical, surgical, social history, OB history to HPI:32947}  Abdominal Pain Associated symptoms: vomiting   Emesis Associated symptoms: abdominal pain        Prior to Admission medications   Medication Sig Start Date End Date Taking? Authorizing Provider  aspirin  81 MG chewable tablet Chew 81 mg by mouth daily.    [provider]  azithromycin  (ZITHROMAX ) 250 MG tablet Take 1 tablet (250 mg total) by mouth daily. Take first 2 tablets together, then 1 every day until finished. 10/30/23   Curatolo, Adam, DO  cetirizine  (ZYRTEC  ALLERGY) 10 MG tablet Take 1 tablet (10 mg total) by mouth daily. 03/05/23   Rising, Asberry, PA-C  vitamin D3 (CHOLECALCIFEROL ) 25 MCG tablet Take 2 tablets (2,000 Units total) by mouth 2 (two) times daily. 06/14/23   Love, Sharlet RAMAN, PA-C  cyclobenzaprine  (FLEXERIL ) 5 MG tablet Take 1 tablet (5 mg total) by mouth 3 (three) times daily as needed for muscle spasms. 06/14/23   Love, Sharlet RAMAN, PA-C  diazepam  (VALIUM ) 10 MG tablet Take 0.5 tablets (5 mg total) by  mouth every 12 (twelve) hours as needed for anxiety. 06/14/23   Love, Sharlet RAMAN, PA-C  fluticasone  (FLONASE ) 50 MCG/ACT nasal spray Place 2 sprays into both nostrils daily. 03/05/23   Rising, Asberry, PA-C  furosemide  (LASIX ) 40 MG tablet Take 1 tablet (40 mg total) by mouth daily. 06/14/23   Love, Sharlet RAMAN, PA-C  gabapentin  (NEURONTIN ) 300 MG capsule Take 1 capsule (300 mg total) by mouth 3 (three) times daily. 06/14/23   Love, Sharlet RAMAN, PA-C  lidocaine  (LIDODERM ) 5 % Place 2 patches onto the skin daily. Remove & Discard patch within 12 hours or as directed by MD 06/14/23   Love, Sharlet RAMAN, PA-C  magnesium  oxide (MAG-OX) 400 MG tablet Take 0.5 tablets (200 mg total) by mouth daily. 06/14/23   Love, Sharlet RAMAN, PA-C  meclizine  (ANTIVERT ) 25 MG tablet Take 25 mg by mouth every 6 (six) hours as needed for dizziness. 09/23/21   [provider]  melatonin 5 MG TABS Take 1 tablet (5 mg total) by mouth at bedtime as needed. 06/14/23   Love, Sharlet RAMAN, PA-C  Menthol , Topical Analgesic, (ABSORBINE JR BACK PATCH EX) Apply 1 patch topically daily as needed (for back pain).    [provider]  methocarbamol  (ROBAXIN ) 750 MG tablet Take 1 tablet (750 mg total) by mouth every 6 (six) hours. 06/14/23   Love, Sharlet RAMAN, PA-C  naloxone  (NARCAN ) nasal spray 4 mg/0.1 mL Place 1 spray into the nose as needed. In case of overdose 06/14/23  Love, Pamela S, PA-C  omeprazole  (PRILOSEC) 40 MG capsule Take 40 mg by mouth daily.    [provider]  oxyCODONE  (OXY IR/ROXICODONE ) 5 MG immediate release tablet Take 1-2 tablets (5-10 mg total) by mouth every 4 (four) hours as needed (5mg  for paim 5-7 level and 10 mg for pain > level 7). 06/14/23   Love, Sharlet RAMAN, PA-C  polyethylene glycol powder (GLYCOLAX /MIRALAX ) 17 GM/SCOOP powder Take 1 capful (17 g) by mouth 2 (two) times daily. 06/14/23   Love, Sharlet RAMAN, PA-C  potassium chloride  SA (KLOR-CON  M) 20 MEQ tablet Take 1 tablet (20 mEq total) by mouth 2 (two) times daily. 06/14/23    Love, Sharlet RAMAN, PA-C  rivaroxaban  (XARELTO ) 20 MG TABS tablet Take 1 tablet (20 mg total) by mouth daily with supper. 06/14/23   Love, Sharlet RAMAN, PA-C  traMADol  (ULTRAM ) 50 MG tablet Take 1 tablet (50 mg total) by mouth every 6 (six) hours. 06/14/23   Love, Sharlet RAMAN, PA-C    Allergies: Contrast media [iodinated contrast media], Percocet [oxycodone -acetaminophen ], Toradol  [ketorolac  tromethamine ], Tylenol  [acetaminophen ], and Vicodin [hydrocodone -acetaminophen ]    Review of Systems  Gastrointestinal:  Positive for abdominal pain and vomiting.    Updated Vital Signs BP (!) 150/82 (BP Location: Left Arm)   Pulse 82   Temp 97.8 F (36.6 C) (Oral)   Resp 17   Ht 5' 5 (1.651 m)   Wt 117.9 kg   LMP 12/28/2015 (Exact Date)   SpO2 100%   BMI 43.27 kg/m   Physical Exam Vitals and nursing note reviewed.  Constitutional:      General: She is awake. She is not in acute distress.    Appearance: She is well-developed. She is not toxic-appearing or diaphoretic.  HENT:     Head: Normocephalic and atraumatic.   Eyes:     General: No scleral icterus.    Extraocular Movements: Extraocular movements intact.    Cardiovascular:     Rate and Rhythm: Normal rate and regular rhythm.     Heart sounds: Normal heart sounds.  Pulmonary:     Effort: Pulmonary effort is normal. No respiratory distress.     Breath sounds: Normal breath sounds. No wheezing, rhonchi or rales.  Chest:     Chest wall: Tenderness (Diffuse chest tenderness with palpation) present.  Abdominal:     General: Abdomen is flat. There is no distension.     Palpations: Abdomen is soft.     Tenderness: There is generalized abdominal tenderness and tenderness in the periumbilical area and suprapubic area. There is no right CVA tenderness, left CVA tenderness, guarding or rebound. Negative signs include Murphy's sign and McBurney's sign.   Skin:    General: Skin is warm and dry.     Capillary Refill: Capillary refill takes less than 2  seconds.   Neurological:     General: No focal deficit present.     Mental Status: She is alert and oriented to person, place, and time.     Motor: No weakness.   Psychiatric:        Mood and Affect: Mood normal.        Behavior: Behavior normal. Behavior is cooperative.     (all labs ordered are listed, but only abnormal results are displayed) Labs Reviewed  COMPREHENSIVE METABOLIC PANEL WITH GFR - Abnormal; Notable for the following components:      Result Value   CO2 21 (*)    Glucose, Bld 136 (*)    Total Bilirubin  1.5 (*)    All other components within normal limits  CBC - Abnormal; Notable for the following components:   WBC 10.7 (*)    All other components within normal limits  LIPASE, BLOOD  URINALYSIS, ROUTINE W REFLEX MICROSCOPIC  TROPONIN I (HIGH SENSITIVITY)  TROPONIN I (HIGH SENSITIVITY)    EKG: None  Radiology: DG Chest 2 View Result Date: 04/30/2024 CLINICAL DATA:  Chest pain. EXAM: CHEST - 2 VIEW COMPARISON:  October 30, 2023. FINDINGS: The heart size and mediastinal contours are within normal limits. Both lungs are clear. The visualized skeletal structures are unremarkable. IMPRESSION: No active cardiopulmonary disease. Electronically Signed   By: Lynwood Landy Raddle M.D.   On: 04/30/2024 20:59   CT ABDOMEN PELVIS WO CONTRAST Result Date: 04/30/2024 CLINICAL DATA:  Acute abdominal pain EXAM: CT ABDOMEN AND PELVIS WITHOUT CONTRAST TECHNIQUE: Multidetector CT imaging of the abdomen and pelvis was performed following the standard protocol without IV contrast. RADIATION DOSE REDUCTION: This exam was performed according to the departmental dose-optimization program which includes automated exposure control, adjustment of the mA and/or kV according to patient size and/or use of iterative reconstruction technique. COMPARISON:  CT chest abdomen and pelvis 05/26/2023 FINDINGS: Lower chest: No acute abnormality. Hepatobiliary: No focal liver abnormality is seen. Status post  cholecystectomy. No biliary dilatation. Pancreas: Unremarkable. No pancreatic ductal dilatation or surrounding inflammatory changes. Spleen: Normal in size without focal abnormality. Adrenals/Urinary Tract: Adrenal glands are unremarkable. Kidneys are normal, without renal calculi, focal lesion, or hydronephrosis. Bladder is unremarkable. Stomach/Bowel: Stomach is within normal limits. Appendix appears normal. No evidence of bowel wall thickening, distention, or inflammatory changes. There is sigmoid colon diverticulosis. Vascular/Lymphatic: No significant vascular findings are present. No enlarged abdominal or pelvic lymph nodes. Reproductive: Uterus and bilateral adnexa are unremarkable. Other: There is trace free fluid in the pelvis. Musculoskeletal: Mild chronic compression deformity of T12 is unchanged. No acute fractures are seen. IMPRESSION: 1. No acute localizing process in the abdomen or pelvis. 2. Sigmoid colon diverticulosis. 3. Trace free fluid in the pelvis. Electronically Signed   By: Greig Pique M.D.   On: 04/30/2024 19:45    {Document cardiac monitor, telemetry assessment procedure when appropriate:32947} Procedures   Medications Ordered in the ED  morphine  (PF) 2 MG/ML injection 2 mg (2 mg Intravenous Given 04/30/24 2102)  sodium chloride  0.9 % bolus 500 mL (500 mLs Intravenous New Bag/Given 04/30/24 2103)      {Click here for ABCD2, HEART and other calculators REFRESH Note before signing:1}                              Medical Decision Making Amount and/or Complexity of Data Reviewed Labs: ordered.  Risk Prescription drug management.   Patient presents to the ED for concern of nausea, vomiting, abdominal pain, this involves an extensive number of treatment options, and is a complaint that carries with it a high risk of complications and morbidity.  The differential diagnosis includes hepatitis, appendicitis, small bowel obstruction, diverticulitis, gastritis, etc.   Co  morbidities that complicate the patient evaluation  History of pulmonary embolism on anticoagulation, anaphylactic allergy to contrast   Lab Tests:  I Ordered, and personally interpreted labs.  The pertinent results include: CBC-remarkable, CMP-unremarkable other than mildly elevated total bili, lipase unremarkable, first troponin 4,   Imaging Studies ordered:  I ordered imaging studies including CT abdomen pelvis without contrast, chest x-ray I independently visualized and interpreted imaging  which showed no acute reason for pain today, trace free pelvic fluid, diverticulosis I agree with the radiologist interpretation   Cardiac Monitoring:  The patient was maintained on a cardiac monitor.  I personally viewed and interpreted the cardiac monitored which showed an underlying rhythm of: Sinus   Medicines ordered and prescription drug management:  I ordered medication including Zofran  for nausea, morphine  given in triage Reevaluation of the patient after these medicines showed that the patient {resolved/improved/worsened:23923::improved} I have reviewed the patients home medicines and have made adjustments as needed   Test Considered:  D-dimer, CTA:   Critical Interventions:  None   Consultations Obtained:  I requested consultation with the ***,  and discussed lab and imaging findings as well as pertinent plan - they recommend: ***   Problem List / ED Course:  58 year old female, nausea and vomiting, abdominal pain, chest pain since this morning Previous history of cholecystectomy No red flag symptoms on exam, no rebound, no guarding, abdomen soft, nondistended, patient vital signs stable, no fever Abdominal pain workup in the emergency department without acute cause for pain, CT abdomen pelvis shows no acute cause, no significant elevation on lab work suggestive of specific intra-abdominal pathology Chest x-ray unremarkable EKG ordered Zofran   ordered   Reevaluation:  After the interventions noted above, I reevaluated the patient and found that they have :{resolved/improved/worsened:23923::improved}   Social Determinants of Health:  None   Dispostion:  After consideration of the diagnostic results and the patients response to treatment, I feel that the patent would benefit from ***.    {Document critical care time when appropriate  Document review of labs and clinical decision tools ie CHADS2VASC2, etc  Document your independent review of radiology images and any outside records  Document your discussion with family members, caretakers and with consultants  Document social determinants of health affecting pt's care  Document your decision making why or why not admission, treatments were needed:32947:::1}   Final diagnoses:  None    ED Discharge Orders     None

## 2024-04-30 NOTE — ED Notes (Signed)
 Patient discharged in stable condition, education materials explained including, follow up, any prescriptions and reasons to return. Patient voiced agreement to education and discharge material.

## 2024-04-30 NOTE — ED Notes (Signed)
 Pt asked for a urine sample, unable to give one at this time

## 2024-04-30 NOTE — ED Provider Triage Note (Signed)
 Emergency Medicine Provider Triage Evaluation Note  Katie [name] , a 58 y.o. female  was evaluated in triage.  Pt complains of abdominal pain all over associated with nausea vomiting.  Probably vomited about 8 times.  Everything started about 10:00 this morning.  The pain does radiate up into her chest as well no diarrhea.  No vomiting blood.  Patient is never had pain like this before.  Patient is allergic to pain medicines other than morphine .  Patient is also allergic to contrast dye.  Past medical history sniffing for hypertension obesity bipolar disorder anxiety pulmonary embolism in 2014 chronic pain and acute bronchitis.  Patient had her gallbladder removed his cardiac catheterization.  Review of Systems  Positive: Abdominal pain nausea vomiting and chest pain Negative: Diarrhea vomiting blood  Physical Exam  BP (!) 161/86   Pulse 65   Temp 97.8 F (36.6 C) (Oral)   Resp (!) 28   Ht 1.651 m (5' 5)   Wt 117.9 kg   LMP 12/28/2015 (Exact Date)   SpO2 99%   BMI 43.27 kg/m  Gen: Awake uncomfortable Resp: Normal effort lungs clear bilaterally Abd: Generalized tenderness no guarding MSK:  Moves extremities without difficulty  Other:    Medical Decision Making  Medically screening exam initiated at 6:18 PM.  Appropriate orders placed.  Katie [name] was informed that the remainder of the evaluation will be completed by another provider, this initial triage assessment does not replace that evaluation, and the importance of remaining in the ED until their evaluation is complete.  Will start some IV fluids.  Will get CT scan abdomen pelvis without contrast since she is allergic to IV dye.  Will get CBC metabolic panel lipase.  Will get EKG chest x-ray and troponins.  Will give IV morphine  patient is already had IV Zofran .  Patient not appropriate for lobby   Magnum Lunde, MD 04/30/24 [phone]

## 2024-05-01 DIAGNOSIS — M1712 Unilateral primary osteoarthritis, left knee: Secondary | ICD-10-CM | POA: Diagnosis not present

## 2024-05-02 DIAGNOSIS — M1712 Unilateral primary osteoarthritis, left knee: Secondary | ICD-10-CM | POA: Diagnosis not present

## 2024-05-03 DIAGNOSIS — M1712 Unilateral primary osteoarthritis, left knee: Secondary | ICD-10-CM | POA: Diagnosis not present

## 2024-05-04 DIAGNOSIS — M1712 Unilateral primary osteoarthritis, left knee: Secondary | ICD-10-CM | POA: Diagnosis not present

## 2024-05-05 DIAGNOSIS — M1712 Unilateral primary osteoarthritis, left knee: Secondary | ICD-10-CM | POA: Diagnosis not present

## 2024-05-06 DIAGNOSIS — M1712 Unilateral primary osteoarthritis, left knee: Secondary | ICD-10-CM | POA: Diagnosis not present

## 2024-05-07 DIAGNOSIS — M1712 Unilateral primary osteoarthritis, left knee: Secondary | ICD-10-CM | POA: Diagnosis not present

## 2024-05-08 DIAGNOSIS — M1712 Unilateral primary osteoarthritis, left knee: Secondary | ICD-10-CM | POA: Diagnosis not present

## 2024-05-09 DIAGNOSIS — M1712 Unilateral primary osteoarthritis, left knee: Secondary | ICD-10-CM | POA: Diagnosis not present

## 2024-05-10 DIAGNOSIS — M1712 Unilateral primary osteoarthritis, left knee: Secondary | ICD-10-CM | POA: Diagnosis not present

## 2024-05-11 DIAGNOSIS — M1712 Unilateral primary osteoarthritis, left knee: Secondary | ICD-10-CM | POA: Diagnosis not present

## 2024-05-12 DIAGNOSIS — M1712 Unilateral primary osteoarthritis, left knee: Secondary | ICD-10-CM | POA: Diagnosis not present

## 2024-05-13 DIAGNOSIS — M1712 Unilateral primary osteoarthritis, left knee: Secondary | ICD-10-CM | POA: Diagnosis not present

## 2024-05-14 DIAGNOSIS — M1712 Unilateral primary osteoarthritis, left knee: Secondary | ICD-10-CM | POA: Diagnosis not present

## 2024-05-15 DIAGNOSIS — M1712 Unilateral primary osteoarthritis, left knee: Secondary | ICD-10-CM | POA: Diagnosis not present

## 2024-05-16 DIAGNOSIS — M1712 Unilateral primary osteoarthritis, left knee: Secondary | ICD-10-CM | POA: Diagnosis not present

## 2024-05-17 DIAGNOSIS — M1712 Unilateral primary osteoarthritis, left knee: Secondary | ICD-10-CM | POA: Diagnosis not present

## 2024-05-18 DIAGNOSIS — M1712 Unilateral primary osteoarthritis, left knee: Secondary | ICD-10-CM | POA: Diagnosis not present

## 2024-05-19 DIAGNOSIS — M1712 Unilateral primary osteoarthritis, left knee: Secondary | ICD-10-CM | POA: Diagnosis not present

## 2024-05-20 DIAGNOSIS — M1712 Unilateral primary osteoarthritis, left knee: Secondary | ICD-10-CM | POA: Diagnosis not present

## 2024-05-21 ENCOUNTER — Emergency Department (HOSPITAL_COMMUNITY)
Admission: EM | Admit: 2024-05-21 | Discharge: 2024-05-22 | Disposition: A | Discharge status: Home / Self Care | Attending: Emergency Medicine | Admitting: Emergency Medicine

## 2024-05-21 ENCOUNTER — Emergency Department (HOSPITAL_COMMUNITY)

## 2024-05-21 ENCOUNTER — Other Ambulatory Visit: Payer: Self-pay

## 2024-05-21 DIAGNOSIS — Z7901 Long term (current) use of anticoagulants: Secondary | ICD-10-CM | POA: Insufficient documentation

## 2024-05-21 DIAGNOSIS — R079 Chest pain, unspecified: Secondary | ICD-10-CM | POA: Insufficient documentation

## 2024-05-21 DIAGNOSIS — D72829 Elevated white blood cell count, unspecified: Secondary | ICD-10-CM | POA: Insufficient documentation

## 2024-05-21 DIAGNOSIS — R112 Nausea with vomiting, unspecified: Secondary | ICD-10-CM | POA: Insufficient documentation

## 2024-05-21 DIAGNOSIS — R600 Localized edema: Secondary | ICD-10-CM | POA: Insufficient documentation

## 2024-05-21 DIAGNOSIS — R109 Unspecified abdominal pain: Secondary | ICD-10-CM | POA: Diagnosis not present

## 2024-05-21 DIAGNOSIS — Z79899 Other long term (current) drug therapy: Secondary | ICD-10-CM | POA: Diagnosis not present

## 2024-05-21 DIAGNOSIS — R42 Dizziness and giddiness: Secondary | ICD-10-CM | POA: Diagnosis not present

## 2024-05-21 DIAGNOSIS — R0602 Shortness of breath: Secondary | ICD-10-CM | POA: Diagnosis not present

## 2024-05-21 DIAGNOSIS — R0789 Other chest pain: Secondary | ICD-10-CM | POA: Diagnosis not present

## 2024-05-21 DIAGNOSIS — Z9049 Acquired absence of other specified parts of digestive tract: Secondary | ICD-10-CM | POA: Diagnosis not present

## 2024-05-21 DIAGNOSIS — M7989 Other specified soft tissue disorders: Secondary | ICD-10-CM | POA: Diagnosis not present

## 2024-05-21 DIAGNOSIS — I10 Essential (primary) hypertension: Secondary | ICD-10-CM | POA: Diagnosis not present

## 2024-05-21 DIAGNOSIS — Z7982 Long term (current) use of aspirin: Secondary | ICD-10-CM | POA: Diagnosis not present

## 2024-05-21 DIAGNOSIS — M1712 Unilateral primary osteoarthritis, left knee: Secondary | ICD-10-CM | POA: Diagnosis not present

## 2024-05-21 LAB — COMPREHENSIVE METABOLIC PANEL WITH GFR
ALT: 7 U/L (ref 0–44)
AST: 14 U/L — ABNORMAL LOW (ref 15–41)
Albumin: 3.3 g/dL — ABNORMAL LOW (ref 3.5–5.0)
Alkaline Phosphatase: 82 U/L (ref 38–126)
Anion gap: 10 (ref 5–15)
BUN: 8 mg/dL (ref 6–20)
CO2: 25 mmol/L (ref 22–32)
Calcium: 8.9 mg/dL (ref 8.9–10.3)
Chloride: 103 mmol/L (ref 98–111)
Creatinine, Ser: 0.75 mg/dL (ref 0.44–1.00)
GFR, Estimated: >60 mL/min (ref >60)
Glucose, Bld: 125 mg/dL — ABNORMAL HIGH (ref 70–99)
Potassium: 3.8 mmol/L (ref 3.5–5.1)
Sodium: 138 mmol/L (ref 135–145)
Total Bilirubin: 0.8 mg/dL (ref 0.0–1.2)
Total Protein: 7.7 g/dL (ref 6.5–8.1)

## 2024-05-21 LAB — CBC
HCT: 49.5 % — ABNORMAL HIGH (ref 36.0–46.0)
Hemoglobin: 16.4 g/dL — ABNORMAL HIGH (ref 12.0–15.0)
MCH: 31.2 pg (ref 26.0–34.0)
MCHC: 33.1 g/dL (ref 30.0–36.0)
MCV: 94.1 fL (ref 80.0–100.0)
Platelets: 342 K/uL (ref 150–400)
RBC: 5.26 MIL/uL — ABNORMAL HIGH (ref 3.87–5.11)
RDW: 13.5 % (ref 11.5–15.5)
WBC: 22 K/uL — ABNORMAL HIGH (ref 4.0–10.5)
nRBC: 0 % (ref 0.0–0.2)

## 2024-05-21 LAB — URINALYSIS, ROUTINE W REFLEX MICROSCOPIC
Bilirubin Urine: NEGATIVE
Glucose, UA: NEGATIVE mg/dL
Hgb urine dipstick: NEGATIVE
Ketones, ur: 5 mg/dL — AB
Leukocytes,Ua: NEGATIVE
Nitrite: NEGATIVE
Protein, ur: 30 mg/dL — AB
Specific Gravity, Urine: 1.03 (ref 1.005–1.030)
pH: 5 (ref 5.0–8.0)

## 2024-05-21 LAB — LIPASE, BLOOD: Lipase: 26 U/L (ref 11–51)

## 2024-05-21 MED ORDER — ONDANSETRON 4 MG PO TBDP
4.0000 mg | ORAL_TABLET | Freq: Once | ORAL | Status: AC
  Administered 2024-05-21: 4 mg via ORAL
  Filled 2024-05-21: qty 1

## 2024-05-21 MED ORDER — OXYCODONE HCL 5 MG PO TABS
5.0000 mg | ORAL_TABLET | ORAL | Status: AC
  Administered 2024-05-21: 5 mg via ORAL
  Filled 2024-05-21: qty 1

## 2024-05-21 NOTE — ED Provider Triage Note (Signed)
 Emergency Medicine Provider Triage Evaluation Note  Katie [name] , a 58 y.o. female  was evaluated in triage.  Pt complains of diffuse abdominal pain, dizziness, nausea, vomiting, diarrhea starting earlier this morning.  Denies any black or bloody stools.  Denies any fevers.  Feels similar to her episode later in June.  Review of Systems  Positive:  Negative:   Physical Exam  BP 138/78   Pulse 76   Temp 98.8 F (37.1 C)   Resp 17   LMP 12/28/2015 (Exact Date)   SpO2 100%  Gen:   Awake, uncomfortable Resp:  Normal effort  MSK:   Moves extremities without difficulty  Other:  Diffuse abdominal tenderness, soft  Medical Decision Making  Medically screening exam initiated at 8:20 PM.  Appropriate orders placed.  Dagoberto [name] was informed that the remainder of the evaluation will be completed by another provider, this initial triage assessment does not replace that evaluation, and the importance of remaining in the ED until their evaluation is complete.  CT ordered   Bernis Ernst, DEVONNA 05/21/24 2021

## 2024-05-21 NOTE — ED Triage Notes (Signed)
 PT arrives via POV. PT c/o abdominal pain, nausea, vomiting, and diarrhea since this morning. Pt arrives AxOx4.

## 2024-05-22 ENCOUNTER — Encounter (HOSPITAL_COMMUNITY): Payer: Self-pay

## 2024-05-22 ENCOUNTER — Emergency Department (HOSPITAL_COMMUNITY)

## 2024-05-22 DIAGNOSIS — M1712 Unilateral primary osteoarthritis, left knee: Secondary | ICD-10-CM | POA: Diagnosis not present

## 2024-05-22 DIAGNOSIS — M7989 Other specified soft tissue disorders: Secondary | ICD-10-CM

## 2024-05-22 LAB — TROPONIN I (HIGH SENSITIVITY): Troponin I (High Sensitivity): 6 ng/L (ref <18)

## 2024-05-22 LAB — D-DIMER, QUANTITATIVE: D-Dimer, Quant: 0.54 ug{FEU}/mL — ABNORMAL HIGH (ref 0.00–0.50)

## 2024-05-22 MED ORDER — LACTATED RINGERS IV BOLUS
1000.0000 mL | Freq: Once | INTRAVENOUS | Status: AC
  Administered 2024-05-22: 1000 mL via INTRAVENOUS

## 2024-05-22 MED ORDER — MECLIZINE HCL 25 MG PO TABS: 25.0000 mg | ORAL_TABLET | Freq: Three times a day (TID) | ORAL | 0 refills | Status: AC | PRN

## 2024-05-22 MED ORDER — ONDANSETRON 4 MG PO TBDP: 4.0000 mg | ORAL_TABLET | Freq: Three times a day (TID) | ORAL | 0 refills | Status: AC | PRN

## 2024-05-22 MED ORDER — MECLIZINE HCL 25 MG PO TABS
25.0000 mg | ORAL_TABLET | Freq: Once | ORAL | Status: AC
  Administered 2024-05-22: 25 mg via ORAL
  Filled 2024-05-22: qty 1

## 2024-05-22 MED ORDER — SODIUM CHLORIDE 0.9 % IV BOLUS
1000.0000 mL | Freq: Once | INTRAVENOUS | Status: AC
  Administered 2024-05-22: 1000 mL via INTRAVENOUS

## 2024-05-22 NOTE — ED Notes (Signed)
 Spoke with provider about pt's BP trending low. 88/59, 99/63, 87/56. Provider to place orders for fluids.

## 2024-05-22 NOTE — ED Notes (Signed)
 This RN assisted pt with ambulation. Pt ambulated about 15 feet, pt states that her legs still feel a little wobbly and lightheaded but she feels fine. Pt states that she usually ambulates with a walker at home.

## 2024-05-22 NOTE — ED Provider Notes (Signed)
 Signout from [employer] at shift change. Briefly, patient presents for N,V,D. Fever last night.  Elevated WBC. Non-contrast CT reassuring.   Also with CP/SOB that started in waiting room. Also leg swelling. DVT study is pending. Age-adjusted d-dimer is negative (0.54<0.55). Wells score 1.5.   Plan: Symptom control. Awaiting DVT study. If positive, will likely need pre-treatment for CTA of the chest.     7:35 AM Reassessment performed. Patient appears stable, comfortable.  She reports that a lot of her symptoms occurred in the setting of vertigo, that was worse when she looked to the left side.  This is calmer now.  She has been able to drink some fluids.   She reports that her leg swelling is chronic.  She has had bilateral ankle surgeries.  She reports that this is largely unchanged.  She reports excellent compliance with her anticoagulant.  She denies chest pain or worsening shortness of breath at baseline.  Labs and imaging personally reviewed and interpreted including: CBC with blood cell count elevated at 22,000, hemoglobin 16.4; CMP glucose 125, normal liver function test; lipase normal; UA unremarkable; troponin normal at 6.   CT imaging of the abdomen pelvis without acute findings.   Most current vital signs reviewed and are as follows: BP 107/71   Pulse 68   Temp 97.9 F (36.6 C) (Oral)   Resp 16   Ht 5' 5 (1.651 m)   Wt 120.2 kg   LMP 12/28/2015 (Exact Date)   SpO2 100%   BMI 44.10 kg/m    Plan: DVT study, likely home if negative.  Will p.o. challenge.  9:45 AM DVT study negative.   Patient ambulated in the hallway.  She states that she feels pretty much back to normal.  Patient has had juice and tolerated some crackers.  She plans to go home and drink fluids, bland diet.  Most recent blood pressures have been running in the 100s over 70s.  Patient discussed with and seen by Dr. Garrick.    Home treatment: Rx zofran , meclizine , bland diet.   Return and follow-up  instructions: Encouraged return to ED with  weakness in their arms or legs, slurred speech, trouble walking or talking, confusion, trouble with their balance, or if they have any other concerns.   Encouraged return with persistent vomiting, worsening abdominal pain, fevers, new or worsening symptoms.  Patient verbalizes understanding and agrees with plan.   Encouraged patient to follow-up with their provider in 2 days if not feeling progressively better. Patient verbalized understanding and agreed with plan.     Results for orders placed or performed during the hospital encounter of 05/21/24  Lipase, blood   Collection Time: 05/21/24  7:11 PM  Result Value Ref Range   Lipase 26 11 - 51 U/L  Comprehensive metabolic panel   Collection Time: 05/21/24  7:11 PM  Result Value Ref Range   Sodium 138 135 - 145 mmol/L   Potassium 3.8 3.5 - 5.1 mmol/L   Chloride 103 98 - 111 mmol/L   CO2 25 22 - 32 mmol/L   Glucose, Bld 125 (H) 70 - 99 mg/dL   BUN 8 6 - 20 mg/dL   Creatinine, Ser 9.24 0.44 - 1.00 mg/dL   Calcium 8.9 8.9 - 89.6 mg/dL   Total Protein 7.7 6.5 - 8.1 g/dL   Albumin  3.3 (L) 3.5 - 5.0 g/dL   AST 14 (L) 15 - 41 U/L   ALT 7 0 - 44 U/L   Alkaline Phosphatase 82  38 - 126 U/L   Total Bilirubin 0.8 0.0 - 1.2 mg/dL   GFR, Estimated >39 >39 mL/min   Anion gap 10 5 - 15  CBC   Collection Time: 05/21/24  7:11 PM  Result Value Ref Range   WBC 22.0 (H) 4.0 - 10.5 K/uL   RBC 5.26 (H) 3.87 - 5.11 MIL/uL   Hemoglobin 16.4 (H) 12.0 - 15.0 g/dL   HCT 50.4 (H) 63.9 - 53.9 %   MCV 94.1 80.0 - 100.0 fL   MCH 31.2 26.0 - 34.0 pg   MCHC 33.1 30.0 - 36.0 g/dL   RDW 86.4 88.4 - 84.4 %   Platelets 342 150 - 400 K/uL   nRBC 0.0 0.0 - 0.2 %  Urinalysis, Routine w reflex microscopic -Urine, Clean Catch   Collection Time: 05/21/24  7:19 PM  Result Value Ref Range   Color, Urine YELLOW YELLOW   APPearance HAZY (A) CLEAR   Specific Gravity, Urine 1.030 1.005 - 1.030   pH 5.0 5.0 - 8.0   Glucose,  UA NEGATIVE NEGATIVE mg/dL   Hgb urine dipstick NEGATIVE NEGATIVE   Bilirubin Urine NEGATIVE NEGATIVE   Ketones, ur 5 (A) NEGATIVE mg/dL   Protein, ur 30 (A) NEGATIVE mg/dL   Nitrite NEGATIVE NEGATIVE   Leukocytes,Ua NEGATIVE NEGATIVE   RBC / HPF 0-5 0 - 5 RBC/hpf   WBC, UA 0-5 0 - 5 WBC/hpf   Bacteria, UA RARE (A) NONE SEEN   Squamous Epithelial / HPF 0-5 0 - 5 /HPF   Mucus PRESENT   D-dimer, quantitative   Collection Time: 05/22/24  5:35 AM  Result Value Ref Range   D-Dimer, Quant 0.54 (H) 0.00 - 0.50 ug/mL-FEU  Troponin I (High Sensitivity)   Collection Time: 05/22/24  5:35 AM  Result Value Ref Range   Troponin I (High Sensitivity) 6 <18 ng/L   VAS US  LOWER EXTREMITY VENOUS (DVT) (7a-7p) Result Date: 05/22/2024  Lower Venous DVT Study Patient Name:  Katie [name]  Date of Exam:   05/22/2024 Medical Rec #: [id number]     Accession #:    [id number] Date of Birth: Oct 11, 1966     Patient Gender: F Patient Age:   58 years Exam Location:  [facility] Procedure:      VAS US  LOWER EXTREMITY VENOUS (DVT) Referring Phys: LONNI LITES --------------------------------------------------------------------------------  Indications: Swelling.  Limitations: Body habitus. Comparison Study: 05/29/2023 Neg for BLE DVT Performing Technologist: Elmarie Lindau, RVT  Examination Guidelines: A complete evaluation includes B-mode imaging, spectral Doppler, color Doppler, and power Doppler as needed of all accessible portions of each vessel. Bilateral testing is considered an integral part of a complete examination. Limited examinations for reoccurring indications may be performed as noted. The reflux portion of the exam is performed with the patient in reverse Trendelenburg.  +-----+---------------+---------+-----------+----------+--------------+ RIGHTCompressibilityPhasicitySpontaneityPropertiesThrombus Aging +-----+---------------+---------+-----------+----------+--------------+ CFV  Full            Yes      Yes                                 +-----+---------------+---------+-----------+----------+--------------+   +---------+---------------+---------+-----------+----------+--------------+ LEFT     CompressibilityPhasicitySpontaneityPropertiesThrombus Aging +---------+---------------+---------+-----------+----------+--------------+ CFV      Full           Yes      Yes                                 +---------+---------------+---------+-----------+----------+--------------+  SFJ      Full                                                        +---------+---------------+---------+-----------+----------+--------------+ FV Prox  Full                                                        +---------+---------------+---------+-----------+----------+--------------+ FV Mid   Full                                                        +---------+---------------+---------+-----------+----------+--------------+ FV DistalFull                                                        +---------+---------------+---------+-----------+----------+--------------+ PFV      Full                                                        +---------+---------------+---------+-----------+----------+--------------+ POP      Full           Yes      Yes                                 +---------+---------------+---------+-----------+----------+--------------+ PTV      Full                                                        +---------+---------------+---------+-----------+----------+--------------+ PERO     Full                                                        +---------+---------------+---------+-----------+----------+--------------+     Summary: RIGHT: - No evidence of common femoral vein obstruction.   LEFT: - There is no evidence of deep vein thrombosis in the lower extremity.  - No cystic structure found in the popliteal fossa.  *See table(s) above for  measurements and observations.    Preliminary    CT ABDOMEN PELVIS WO CONTRAST Result Date: 05/21/2024 CLINICAL DATA:  Acute abdominal pain EXAM: CT ABDOMEN AND PELVIS WITHOUT CONTRAST TECHNIQUE: Multidetector CT imaging of the abdomen and pelvis was performed following the standard protocol without IV contrast. RADIATION DOSE REDUCTION: This exam was performed according to the departmental dose-optimization program which includes automated exposure control,  adjustment of the mA and/or kV according to patient size and/or use of iterative reconstruction technique. COMPARISON:  CT abdomen and pelvis 04/30/2024 FINDINGS: Lower chest: No acute abnormality. Hepatobiliary: No focal liver abnormality is seen. Status post cholecystectomy. No biliary dilatation. Pancreas: Unremarkable. No pancreatic ductal dilatation or surrounding inflammatory changes. Spleen: Normal in size without focal abnormality. Adrenals/Urinary Tract: Adrenal glands are unremarkable. Kidneys are normal, without renal calculi, focal lesion, or hydronephrosis. Bladder is unremarkable. Stomach/Bowel: Stomach is within normal limits. Appendix appears normal. No evidence of bowel wall thickening, distention, or inflammatory changes. Vascular/Lymphatic: No significant vascular findings are present. No enlarged abdominal or pelvic lymph nodes. Reproductive: Rounded lobulated area projecting from the uterine fundus measures 4.7 cm. Adnexa are within normal limits. Other: There is trace free fluid in the pelvis. No abdominal wall hernia. Musculoskeletal: T12 chronic compression deformity is unchanged. IMPRESSION: 1. Trace free fluid in the pelvis, nonspecific. 2. Rounded lobulated area projecting from the uterine fundus measures 4.7 cm, possibly a fibroid. This can be further evaluated with nonemergent pelvic ultrasound. 3. Otherwise no acute localizing process in the abdomen or pelvis. Electronically Signed   By: Greig Pique M.D.   On: 05/21/2024 21:19    DG Chest 2 View Result Date: 04/30/2024 CLINICAL DATA:  Chest pain. EXAM: CHEST - 2 VIEW COMPARISON:  October 30, 2023. FINDINGS: The heart size and mediastinal contours are within normal limits. Both lungs are clear. The visualized skeletal structures are unremarkable. IMPRESSION: No active cardiopulmonary disease. Electronically Signed   By: Lynwood Landy Raddle M.D.   On: 04/30/2024 20:59   CT ABDOMEN PELVIS WO CONTRAST Result Date: 04/30/2024 CLINICAL DATA:  Acute abdominal pain EXAM: CT ABDOMEN AND PELVIS WITHOUT CONTRAST TECHNIQUE: Multidetector CT imaging of the abdomen and pelvis was performed following the standard protocol without IV contrast. RADIATION DOSE REDUCTION: This exam was performed according to the departmental dose-optimization program which includes automated exposure control, adjustment of the mA and/or kV according to patient size and/or use of iterative reconstruction technique. COMPARISON:  CT chest abdomen and pelvis 05/26/2023 FINDINGS: Lower chest: No acute abnormality. Hepatobiliary: No focal liver abnormality is seen. Status post cholecystectomy. No biliary dilatation. Pancreas: Unremarkable. No pancreatic ductal dilatation or surrounding inflammatory changes. Spleen: Normal in size without focal abnormality. Adrenals/Urinary Tract: Adrenal glands are unremarkable. Kidneys are normal, without renal calculi, focal lesion, or hydronephrosis. Bladder is unremarkable. Stomach/Bowel: Stomach is within normal limits. Appendix appears normal. No evidence of bowel wall thickening, distention, or inflammatory changes. There is sigmoid colon diverticulosis. Vascular/Lymphatic: No significant vascular findings are present. No enlarged abdominal or pelvic lymph nodes. Reproductive: Uterus and bilateral adnexa are unremarkable. Other: There is trace free fluid in the pelvis. Musculoskeletal: Mild chronic compression deformity of T12 is unchanged. No acute fractures are seen. IMPRESSION: 1. No  acute localizing process in the abdomen or pelvis. 2. Sigmoid colon diverticulosis. 3. Trace free fluid in the pelvis. Electronically Signed   By: Greig Pique M.D.   On: 04/30/2024 19:45    For this patient's complaint of abdominal pain, the following conditions were considered on the differential diagnosis: gastritis/PUD, enteritis/duodenitis, appendicitis, cholelithiasis/cholecystitis, cholangitis, pancreatitis, ruptured viscus, colitis, diverticulitis, small/large bowel obstruction, proctitis, cystitis, pyelonephritis, ureteral colic, aortic dissection, aortic aneurysm. In women, pelvic inflammatory disease, ovarian cysts, and tubo-ovarian abscess were also considered. Atypical chest etiologies were also considered including ACS, PE, and pneumonia.  Elevated white blood cell count, likely stress reaction, CT is negative for any emergent or surgical causes of symptoms.  May be related to gastroenteritis versus IBS.  Low concern for PE given no tachycardia, DVT, compliant with anticoagulants.  No hypoxia at any time.  Normal troponin.  Dizziness described as vertigo worse with head movement.  This is suggestive of peripheral vertigo.  This has resolved and patient without any other focal neuro deficits.  Patient did have some softer blood pressures, treated with IV fluids.  These have improved.  Patient ambulating without dizziness or lightheadedness.  She will continue hydration at home.  Patient has clinically improved and has not decompensated during 18-hr ED stay.   The patient's vital signs, pertinent lab work and imaging were reviewed and interpreted as discussed in the ED course. Hospitalization was considered for further testing, treatments, or serial exams/observation. However as patient is well-appearing, has a stable exam, and reassuring studies today, I do not feel that they warrant admission at this time. This plan was discussed with the patient who verbalizes agreement and comfort with  this plan and seems reliable and able to return to the Emergency Department with worsening or changing symptoms.          Desiderio Chew, PA-C 05/22/24 1033    Garrick Charleston, MD 05/22/24 1215

## 2024-05-22 NOTE — ED Notes (Signed)
 Pt was able to drink a ginger ale and eat some cracker. She states that she feels fine and was able to keep everything down.

## 2024-05-22 NOTE — Progress Notes (Signed)
 LLE venous exam is completed. Hazel Leveille, RVT

## 2024-05-22 NOTE — ED Provider Notes (Signed)
 [place] [facility] AT [facility] Provider Note   CSN: [id number] Arrival date & time: 05/21/24  1601     Patient presents with: Emesis and Nausea   Katie [name] is a 58 y.o. female with medical history significant for bipolar 1 disorder, depression, hypertension, edema of legs due to venous obstruction, IBS, dysmenorrhea, GERD, chest pain, dyspnea on exertion, PE on Xarelto , chronic pain.  Patient presents to ED for evaluation of multiple complaints.  States around 9 AM yesterday she began to develop nausea, vomiting and diarrhea.  Reports 9-10 episodes of nausea and vomiting without blood.  Reports 5 episodes of diarrhea without blood.  Also endorsing nonfocal abdominal pain.  Patient reports that 2 days ago she developed dizziness worse when lying on the left side.  She reports a history of vertigo, has not taken any meclizine .  She endorses a headache but denies any blurred vision beyond baseline.  She endorses chest pain and shortness of breath that began while waiting in the waiting room.  She reports a history of PE she states she has been compliant on her blood thinner.  She also endorses left leg swelling that occurred in the last 24 hours.  She is unsure of an exertional component to her chest pain.  She states she had fever last night at home with a 101 Fahrenheit.  She denies sick contacts.  She denies any current dizziness.  Reports that her dizziness only occurs when she lays on the left side.  States when she lays flat the dizziness subsides.  She describes the dizziness as the room spinning.    Emesis Associated symptoms: abdominal pain and diarrhea        Prior to Admission medications   Medication Sig Start Date End Date Taking? Authorizing Provider  aspirin  81 MG chewable tablet Chew 81 mg by mouth daily.    [provider]  azithromycin  (ZITHROMAX ) 250 MG tablet Take 1 tablet (250 mg total) by mouth daily. Take first 2 tablets together, then  1 every day until finished. 10/30/23   Curatolo, Adam, DO  cetirizine  (ZYRTEC  ALLERGY) 10 MG tablet Take 1 tablet (10 mg total) by mouth daily. 03/05/23   Rising, Asberry, PA-C  vitamin D3 (CHOLECALCIFEROL ) 25 MCG tablet Take 2 tablets (2,000 Units total) by mouth 2 (two) times daily. 06/14/23   Love, Sharlet RAMAN, PA-C  cyclobenzaprine  (FLEXERIL ) 5 MG tablet Take 1 tablet (5 mg total) by mouth 3 (three) times daily as needed for muscle spasms. 06/14/23   Love, Sharlet RAMAN, PA-C  diazepam  (VALIUM ) 10 MG tablet Take 0.5 tablets (5 mg total) by mouth every 12 (twelve) hours as needed for anxiety. 06/14/23   Love, Sharlet RAMAN, PA-C  fluticasone  (FLONASE ) 50 MCG/ACT nasal spray Place 2 sprays into both nostrils daily. 03/05/23   Rising, Asberry, PA-C  furosemide  (LASIX ) 40 MG tablet Take 1 tablet (40 mg total) by mouth daily. 06/14/23   Love, Sharlet RAMAN, PA-C  gabapentin  (NEURONTIN ) 300 MG capsule Take 1 capsule (300 mg total) by mouth 3 (three) times daily. 06/14/23   Love, Sharlet RAMAN, PA-C  lidocaine  (LIDODERM ) 5 % Place 2 patches onto the skin daily. Remove & Discard patch within 12 hours or as directed by MD 06/14/23   Love, Sharlet RAMAN, PA-C  magnesium  oxide (MAG-OX) 400 MG tablet Take 0.5 tablets (200 mg total) by mouth daily. 06/14/23   Love, Sharlet RAMAN, PA-C  meclizine  (ANTIVERT ) 25 MG tablet Take 25 mg by mouth every  6 (six) hours as needed for dizziness. 09/23/21   [provider]  melatonin 5 MG TABS Take 1 tablet (5 mg total) by mouth at bedtime as needed. 06/14/23   Love, Sharlet RAMAN, PA-C  Menthol , Topical Analgesic, (ABSORBINE JR BACK PATCH EX) Apply 1 patch topically daily as needed (for back pain).    [provider]  methocarbamol  (ROBAXIN ) 750 MG tablet Take 1 tablet (750 mg total) by mouth every 6 (six) hours. 06/14/23   Love, Sharlet RAMAN, PA-C  naloxone  (NARCAN ) nasal spray 4 mg/0.1 mL Place 1 spray into the nose as needed. In case of overdose 06/14/23   Love, Sharlet RAMAN, PA-C  omeprazole  (PRILOSEC) 40 MG capsule  Take 40 mg by mouth daily.    [provider]  ondansetron  (ZOFRAN ) 4 MG tablet Take 1 tablet (4 mg total) by mouth every 6 (six) hours. 04/30/24   Hinnant, Collin F, PA-C  oxyCODONE  (OXY IR/ROXICODONE ) 5 MG immediate release tablet Take 1-2 tablets (5-10 mg total) by mouth every 4 (four) hours as needed (5mg  for paim 5-7 level and 10 mg for pain > level 7). 06/14/23   Love, Sharlet RAMAN, PA-C  polyethylene glycol powder (GLYCOLAX /MIRALAX ) 17 GM/SCOOP powder Take 1 capful (17 g) by mouth 2 (two) times daily. 06/14/23   Love, Sharlet RAMAN, PA-C  potassium chloride  SA (KLOR-CON  M) 20 MEQ tablet Take 1 tablet (20 mEq total) by mouth 2 (two) times daily. 06/14/23   Love, Sharlet RAMAN, PA-C  rivaroxaban  (XARELTO ) 20 MG TABS tablet Take 1 tablet (20 mg total) by mouth daily with supper. 06/14/23   Love, Sharlet RAMAN, PA-C  traMADol  (ULTRAM ) 50 MG tablet Take 1 tablet (50 mg total) by mouth every 6 (six) hours. 06/14/23   Love, Sharlet RAMAN, PA-C    Allergies: Contrast media [iodinated contrast media], Percocet [oxycodone -acetaminophen ], Toradol  [ketorolac  tromethamine ], Tylenol  [acetaminophen ], and Vicodin [hydrocodone -acetaminophen ]    Review of Systems  Respiratory:  Positive for shortness of breath.   Cardiovascular:  Positive for chest pain and leg swelling.  Gastrointestinal:  Positive for abdominal pain, diarrhea, nausea and vomiting.  All other systems reviewed and are negative.   Updated Vital Signs BP 96/61   Pulse 71   Temp 97.9 F (36.6 C) (Oral)   Resp 15   Ht 5' 5 (1.651 m)   Wt 120.2 kg   LMP 12/28/2015 (Exact Date)   SpO2 97%   BMI 44.10 kg/m   Physical Exam Vitals and nursing note reviewed.  Constitutional:      General: She is not in acute distress.    Appearance: She is well-developed.  HENT:     Head: Normocephalic and atraumatic.  Eyes:     Conjunctiva/sclera: Conjunctivae normal.  Cardiovascular:     Rate and Rhythm: Normal rate and regular rhythm.     Heart sounds: No murmur  heard. Pulmonary:     Effort: Pulmonary effort is normal. No respiratory distress.     Breath sounds: Normal breath sounds.  Abdominal:     Palpations: Abdomen is soft.     Tenderness: There is abdominal tenderness.     Comments: Nonfocal abdominal tenderness throughout  Musculoskeletal:        General: No swelling.     Cervical back: Neck supple.     Left lower leg: Edema present.  Skin:    General: Skin is warm and dry.     Capillary Refill: Capillary refill takes less than 2 seconds.  Neurological:  Mental Status: She is alert and oriented to person, place, and time. Mental status is at baseline.  Psychiatric:        Mood and Affect: Mood normal.     (all labs ordered are listed, but only abnormal results are displayed) Labs Reviewed  COMPREHENSIVE METABOLIC PANEL WITH GFR - Abnormal; Notable for the following components:      Result Value   Glucose, Bld 125 (*)    Albumin  3.3 (*)    AST 14 (*)    All other components within normal limits  CBC - Abnormal; Notable for the following components:   WBC 22.0 (*)    RBC 5.26 (*)    Hemoglobin 16.4 (*)    HCT 49.5 (*)    All other components within normal limits  URINALYSIS, ROUTINE W REFLEX MICROSCOPIC - Abnormal; Notable for the following components:   APPearance HAZY (*)    Ketones, ur 5 (*)    Protein, ur 30 (*)    Bacteria, UA RARE (*)    All other components within normal limits  D-DIMER, QUANTITATIVE - Abnormal; Notable for the following components:   D-Dimer, Quant 0.54 (*)    All other components within normal limits  LIPASE, BLOOD  TROPONIN I (HIGH SENSITIVITY)    EKG: EKG Interpretation Date/Time:  Thursday May 22 2024 05:39:58 EDT Ventricular Rate:  56 PR Interval:  153 QRS Duration:  94 QT Interval:  436 QTC Calculation: 421 R Axis:   179  Text Interpretation: Sinus rhythm Right axis deviation Borderline T wave abnormalities When compared with ECG of 04/30/2024, No significant change was found  Confirmed by Raford Lenis ([id number]) on 05/22/2024 6:03:40 AM  Radiology: CT ABDOMEN PELVIS WO CONTRAST Result Date: 05/21/2024 CLINICAL DATA:  Acute abdominal pain EXAM: CT ABDOMEN AND PELVIS WITHOUT CONTRAST TECHNIQUE: Multidetector CT imaging of the abdomen and pelvis was performed following the standard protocol without IV contrast. RADIATION DOSE REDUCTION: This exam was performed according to the departmental dose-optimization program which includes automated exposure control, adjustment of the mA and/or kV according to patient size and/or use of iterative reconstruction technique. COMPARISON:  CT abdomen and pelvis 04/30/2024 FINDINGS: Lower chest: No acute abnormality. Hepatobiliary: No focal liver abnormality is seen. Status post cholecystectomy. No biliary dilatation. Pancreas: Unremarkable. No pancreatic ductal dilatation or surrounding inflammatory changes. Spleen: Normal in size without focal abnormality. Adrenals/Urinary Tract: Adrenal glands are unremarkable. Kidneys are normal, without renal calculi, focal lesion, or hydronephrosis. Bladder is unremarkable. Stomach/Bowel: Stomach is within normal limits. Appendix appears normal. No evidence of bowel wall thickening, distention, or inflammatory changes. Vascular/Lymphatic: No significant vascular findings are present. No enlarged abdominal or pelvic lymph nodes. Reproductive: Rounded lobulated area projecting from the uterine fundus measures 4.7 cm. Adnexa are within normal limits. Other: There is trace free fluid in the pelvis. No abdominal wall hernia. Musculoskeletal: T12 chronic compression deformity is unchanged. IMPRESSION: 1. Trace free fluid in the pelvis, nonspecific. 2. Rounded lobulated area projecting from the uterine fundus measures 4.7 cm, possibly a fibroid. This can be further evaluated with nonemergent pelvic ultrasound. 3. Otherwise no acute localizing process in the abdomen or pelvis. Electronically Signed   By: Greig Pique M.D.    On: 05/21/2024 21:19    Procedures   Medications Ordered in the ED  ondansetron  (ZOFRAN -ODT) disintegrating tablet 4 mg (4 mg Oral Given 05/21/24 2040)  oxyCODONE  (Oxy IR/ROXICODONE ) immediate release tablet 5 mg (5 mg Oral Given 05/21/24 2040)  sodium chloride  0.9 % bolus 1,000  mL (1,000 mLs Intravenous New Bag/Given 05/22/24 0541)  meclizine  (ANTIVERT ) tablet 25 mg (25 mg Oral Given 05/22/24 0528)    Clinical Course as of 05/22/24 0645  Thu May 22, 2024  0517 D-dimer, if positive will need premedication as she has anaphylaxis to contrast dye. [CG]    Clinical Course User Index [CG] Ruthell Lonni FALCON, PA-C   Medical Decision Making Amount and/or Complexity of Data Reviewed Labs: ordered.   58 year old female presents for evaluation.  Please see HPI for further details.  58 year old presents with nausea, vomiting with diarrhea.  Reports the chest pain and shortness of breath began in the waiting room.  Also complaining of left leg swelling.  Has history of PE, does take Xarelto .  She has history of IBS.  She has had no nausea or vomiting at the department.  Will assess patient with CBC, CMP, urinalysis, D-dimer, lipase, troponin x 2, EKG.  Patient had CT scan of her abdomen obtained in triage which shows trace free fluid.  Also shows rounded lobulated area projecting from the uterine fundus measuring 4.7 cm possibly a fibroid.  Patient CBC with leukocytosis to 22, hemoglobin 16.4.  Patient leukocytosis could be secondary to stress reaction as she has had multiple episodes of nausea and vomiting.  She is afebrile and nontachycardic.  Her metabolic panel is unremarkable.  Her urinalysis shows ketones, protein and bacteria but she denies dysuria.  Her troponin is 4, delta pending.  D-dimer is 0.54 however when age-adjusted makes VTE unlikely.  She is also not hypoxic, not tachycardic and she reports compliance on Xarelto  so I feel as if PE is less likely.  Her lipase is 26.  She was  provided with meclizine , Zofran .  She reports her dizziness has subsided.  Will sign patient out pending DVT study of left lower extremity.  Signed out to oncoming provider Geiple PA-C.  Plan of management discussed    Final diagnoses:  Nausea and vomiting, unspecified vomiting type  Chest pain, unspecified type  Shortness of breath    ED Discharge Orders     None          Ruthell Lonni FALCON DEVONNA 05/22/24 0645    Raford Lenis, MD 05/22/24 [phone]

## 2024-05-22 NOTE — Discharge Instructions (Signed)
 Please read and follow all provided instructions.  Your diagnoses today include:  1. Nausea and vomiting, unspecified vomiting type   2. Chest pain, unspecified type   3. Shortness of breath   4. Vertigo     Tests performed today include: Complete blood cell count: Elevated white blood cell count and elevated hemoglobin, likely due to dehydration Complete metabolic panel: No concerning findings Lipase (pancreas function test): Was normal Urinalysis (urine test): No definite signs of infection Blood test for stress in the heart was normal Blood test for screening for blood clots: Was normal based on your age Ultrasound of your leg did not show any signs of blood clot CT of your abdomen pelvis did not show any acute findings to explain your symptoms Chest x-ray was negative Vital signs. See below for your results today.   Medications prescribed:  Zofran  (ondansetron ) - for nausea and vomiting  Meclizine  -medication to use for spinning type dizziness or vertigo  Take any prescribed medications only as directed.  Home care instructions:  Follow any educational materials contained in this packet.  You should rest for the next several days. Keep drinking plenty of fluids and use the medicine for nausea as directed.   Drink clear liquids for the next 24 hours and introduce solid foods slowly after 24 hours using the b.r.a.t. diet (Bananas, Rice, Applesauce, Toast, Yogurt).    Follow-up instructions: Please follow-up with your primary care provider in the next 2 days for further evaluation of your symptoms. If you are not feeling better in 48 hours you may have a condition that is more serious and you need re-evaluation.   Return instructions:  SEEK IMMEDIATE MEDICAL ATTENTION IF: If you have pain that does not go away or becomes severe  A temperature above 101F develops  Repeated vomiting occurs (multiple episodes)  If you have pain that becomes localized to portions of the abdomen.  The right side could possibly be appendicitis. In an adult, the left lower portion of the abdomen could be colitis or diverticulitis.  Blood is being passed in stools or vomit (bright red or black tarry stools)  You develop chest pain, difficulty breathing, dizziness or fainting, or become confused, poorly responsive, or inconsolable (young children) If you have any other emergent concerns regarding your health  Additional Information: Abdominal (belly) pain can be caused by many things. Your caregiver performed an examination and possibly ordered blood/urine tests and imaging (CT scan, x-rays, ultrasound). Many cases can be observed and treated at home after initial evaluation in the emergency department. Even though you are being discharged home, abdominal pain can be unpredictable. Therefore, you need a repeated exam if your pain does not resolve, returns, or worsens. Most patients with abdominal pain don't have to be admitted to the hospital or have surgery, but serious problems like appendicitis and gallbladder attacks can start out as nonspecific pain. Many abdominal conditions cannot be diagnosed in one visit, so follow-up evaluations are very important.  Your vital signs today were: BP 90/74 (BP Location: Left Arm)   Pulse (!) 58   Temp 97.6 F (36.4 C) (Oral)   Resp 17   Ht 5' 5 (1.651 m)   Wt 120.2 kg   LMP 12/28/2015 (Exact Date)   SpO2 100%   BMI 44.10 kg/m  If your blood pressure (bp) was elevated above 135/85 this visit, please have this repeated by your doctor within one month. --------------

## 2024-05-22 NOTE — ED Notes (Signed)
 Pt in no acute distress. States that she feels tired. Denies any chest pain, states that it is tender and it radiates into her back but she feels that it has improved since she has stopped vomiting. Pt states that shob is common for her and she does feel shob now. O2 is 99% on RA.

## 2024-05-23 DIAGNOSIS — M1712 Unilateral primary osteoarthritis, left knee: Secondary | ICD-10-CM | POA: Diagnosis not present

## 2024-05-24 DIAGNOSIS — M1712 Unilateral primary osteoarthritis, left knee: Secondary | ICD-10-CM | POA: Diagnosis not present

## 2024-05-25 DIAGNOSIS — M1712 Unilateral primary osteoarthritis, left knee: Secondary | ICD-10-CM | POA: Diagnosis not present

## 2024-06-24 DIAGNOSIS — M1712 Unilateral primary osteoarthritis, left knee: Secondary | ICD-10-CM | POA: Diagnosis not present

## 2024-06-25 DIAGNOSIS — M1712 Unilateral primary osteoarthritis, left knee: Secondary | ICD-10-CM | POA: Diagnosis not present

## 2024-06-29 DIAGNOSIS — M1712 Unilateral primary osteoarthritis, left knee: Secondary | ICD-10-CM | POA: Diagnosis not present

## 2024-07-01 DIAGNOSIS — M1712 Unilateral primary osteoarthritis, left knee: Secondary | ICD-10-CM | POA: Diagnosis not present

## 2024-07-05 DIAGNOSIS — M1712 Unilateral primary osteoarthritis, left knee: Secondary | ICD-10-CM | POA: Diagnosis not present

## 2024-07-07 DIAGNOSIS — M1712 Unilateral primary osteoarthritis, left knee: Secondary | ICD-10-CM | POA: Diagnosis not present

## 2024-07-08 DIAGNOSIS — M1712 Unilateral primary osteoarthritis, left knee: Secondary | ICD-10-CM | POA: Diagnosis not present

## 2024-07-10 DIAGNOSIS — M1712 Unilateral primary osteoarthritis, left knee: Secondary | ICD-10-CM | POA: Diagnosis not present

## 2024-07-12 DIAGNOSIS — M1712 Unilateral primary osteoarthritis, left knee: Secondary | ICD-10-CM | POA: Diagnosis not present

## 2024-07-14 DIAGNOSIS — M1712 Unilateral primary osteoarthritis, left knee: Secondary | ICD-10-CM | POA: Diagnosis not present

## 2024-07-15 DIAGNOSIS — M1712 Unilateral primary osteoarthritis, left knee: Secondary | ICD-10-CM | POA: Diagnosis not present

## 2024-07-17 DIAGNOSIS — M1712 Unilateral primary osteoarthritis, left knee: Secondary | ICD-10-CM | POA: Diagnosis not present

## 2024-07-19 DIAGNOSIS — M1712 Unilateral primary osteoarthritis, left knee: Secondary | ICD-10-CM | POA: Diagnosis not present

## 2024-07-20 ENCOUNTER — Emergency Department (HOSPITAL_COMMUNITY)
Admission: EM | Admit: 2024-07-20 | Discharge: 2024-07-20 | Disposition: A | Discharge status: Home / Self Care | Attending: Emergency Medicine | Admitting: Emergency Medicine

## 2024-07-20 ENCOUNTER — Emergency Department (HOSPITAL_COMMUNITY)

## 2024-07-20 ENCOUNTER — Other Ambulatory Visit: Payer: Self-pay

## 2024-07-20 ENCOUNTER — Encounter (HOSPITAL_COMMUNITY): Payer: Self-pay

## 2024-07-20 DIAGNOSIS — F319 Bipolar disorder, unspecified: Secondary | ICD-10-CM | POA: Diagnosis present

## 2024-07-20 DIAGNOSIS — R001 Bradycardia, unspecified: Secondary | ICD-10-CM | POA: Diagnosis present

## 2024-07-20 DIAGNOSIS — I10 Essential (primary) hypertension: Secondary | ICD-10-CM | POA: Diagnosis not present

## 2024-07-20 DIAGNOSIS — Z96652 Presence of left artificial knee joint: Secondary | ICD-10-CM | POA: Insufficient documentation

## 2024-07-20 DIAGNOSIS — R0789 Other chest pain: Secondary | ICD-10-CM | POA: Diagnosis not present

## 2024-07-20 DIAGNOSIS — Z7901 Long term (current) use of anticoagulants: Secondary | ICD-10-CM | POA: Insufficient documentation

## 2024-07-20 DIAGNOSIS — R079 Chest pain, unspecified: Secondary | ICD-10-CM

## 2024-07-20 DIAGNOSIS — R072 Precordial pain: Secondary | ICD-10-CM | POA: Diagnosis present

## 2024-07-20 DIAGNOSIS — Z7982 Long term (current) use of aspirin: Secondary | ICD-10-CM | POA: Insufficient documentation

## 2024-07-20 DIAGNOSIS — F32A Depression, unspecified: Secondary | ICD-10-CM | POA: Diagnosis present

## 2024-07-20 DIAGNOSIS — Q254 Congenital malformation of aorta unspecified: Secondary | ICD-10-CM | POA: Diagnosis not present

## 2024-07-20 DIAGNOSIS — G8929 Other chronic pain: Secondary | ICD-10-CM | POA: Diagnosis present

## 2024-07-20 DIAGNOSIS — S22080A Wedge compression fracture of T11-T12 vertebra, initial encounter for closed fracture: Secondary | ICD-10-CM | POA: Diagnosis present

## 2024-07-20 DIAGNOSIS — R140 Abdominal distension (gaseous): Secondary | ICD-10-CM | POA: Diagnosis not present

## 2024-07-20 DIAGNOSIS — K219 Gastro-esophageal reflux disease without esophagitis: Secondary | ICD-10-CM | POA: Diagnosis present

## 2024-07-20 LAB — TROPONIN I (HIGH SENSITIVITY)
Troponin I (High Sensitivity): 4 ng/L (ref <18)
Troponin I (High Sensitivity): 3 ng/L (ref <18)

## 2024-07-20 LAB — CBC
HCT: 42 % (ref 36.0–46.0)
Hemoglobin: 13.5 g/dL (ref 12.0–15.0)
MCH: 31 pg (ref 26.0–34.0)
MCHC: 32.1 g/dL (ref 30.0–36.0)
MCV: 96.3 fL (ref 80.0–100.0)
Platelets: 321 K/uL (ref 150–400)
RBC: 4.36 MIL/uL (ref 3.87–5.11)
RDW: 13.6 % (ref 11.5–15.5)
WBC: 9.3 K/uL (ref 4.0–10.5)
nRBC: 0 % (ref 0.0–0.2)

## 2024-07-20 LAB — BASIC METABOLIC PANEL WITH GFR
Anion gap: 10 (ref 5–15)
BUN: 15 mg/dL (ref 6–20)
CO2: 24 mmol/L (ref 22–32)
Calcium: 8.7 mg/dL — ABNORMAL LOW (ref 8.9–10.3)
Chloride: 100 mmol/L (ref 98–111)
Creatinine, Ser: 0.78 mg/dL (ref 0.44–1.00)
GFR, Estimated: >60 mL/min (ref >60)
Glucose, Bld: 90 mg/dL (ref 70–99)
Potassium: 4 mmol/L (ref 3.5–5.1)
Sodium: 134 mmol/L — ABNORMAL LOW (ref 135–145)

## 2024-07-20 LAB — I-STAT CHEM 8, ED
BUN: 10 mg/dL (ref 6–20)
Calcium, Ion: 1.11 mmol/L — ABNORMAL LOW (ref 1.15–1.40)
Chloride: 104 mmol/L (ref 98–111)
Creatinine, Ser: 0.8 mg/dL (ref 0.44–1.00)
Glucose, Bld: 91 mg/dL (ref 70–99)
HCT: 42 % (ref 36.0–46.0)
Hemoglobin: 14.3 g/dL (ref 12.0–15.0)
Potassium: 4 mmol/L (ref 3.5–5.1)
Sodium: 140 mmol/L (ref 135–145)
TCO2: 26 mmol/L (ref 22–32)

## 2024-07-20 LAB — RESP PANEL BY RT-PCR (RSV, FLU A&B, COVID)  RVPGX2
Influenza A by PCR: NEGATIVE
Influenza B by PCR: NEGATIVE
Resp Syncytial Virus by PCR: NEGATIVE
SARS Coronavirus 2 by RT PCR: NEGATIVE

## 2024-07-20 LAB — BRAIN NATRIURETIC PEPTIDE: B Natriuretic Peptide: 9.6 pg/mL (ref 0.0–100.0)

## 2024-07-20 LAB — D-DIMER, QUANTITATIVE: D-Dimer, Quant: 0.61 ug{FEU}/mL — ABNORMAL HIGH (ref 0.00–0.50)

## 2024-07-20 MED ORDER — LIDOCAINE 5 % EX PTCH
1.0000 | MEDICATED_PATCH | CUTANEOUS | Status: DC
  Administered 2024-07-20: 1 via TRANSDERMAL
  Filled 2024-07-20: qty 1

## 2024-07-20 MED ORDER — MORPHINE SULFATE (PF) 4 MG/ML IV SOLN
4.0000 mg | Freq: Once | INTRAVENOUS | Status: AC
Start: 2024-07-20 — End: 2024-07-20
  Administered 2024-07-20: 4 mg via INTRAVENOUS
  Filled 2024-07-20: qty 1

## 2024-07-20 MED ORDER — MORPHINE SULFATE (PF) 4 MG/ML IV SOLN
4.0000 mg | Freq: Once | INTRAVENOUS | Status: AC
  Administered 2024-07-20: 4 mg via INTRAVENOUS
  Filled 2024-07-20: qty 1

## 2024-07-20 MED ORDER — METHYLPREDNISOLONE SODIUM SUCC 40 MG IJ SOLR: 40.0000 mg | Freq: Once | INTRAMUSCULAR | Status: DC

## 2024-07-20 MED ORDER — GADOBUTROL 1 MMOL/ML IV SOLN
10.0000 mL | Freq: Once | INTRAVENOUS | Status: AC | PRN
  Administered 2024-07-20: 10 mL via INTRAVENOUS

## 2024-07-20 MED ORDER — SODIUM CHLORIDE 0.9 % IV BOLUS
500.0000 mL | Freq: Once | INTRAVENOUS | Status: AC
  Administered 2024-07-20: 500 mL via INTRAVENOUS

## 2024-07-20 MED ORDER — DIPHENHYDRAMINE HCL 25 MG PO CAPS: 50.0000 mg | ORAL_CAPSULE | Freq: Once | ORAL | Status: DC

## 2024-07-20 MED ORDER — DICYCLOMINE HCL 10 MG PO CAPS
10.0000 mg | ORAL_CAPSULE | Freq: Once | ORAL | Status: AC
  Administered 2024-07-20: 10 mg via ORAL
  Filled 2024-07-20: qty 1

## 2024-07-20 MED ORDER — DIPHENHYDRAMINE HCL 50 MG/ML IJ SOLN: 50.0000 mg | Freq: Once | INTRAMUSCULAR | Status: DC

## 2024-07-20 NOTE — ED Notes (Signed)
 XR at bedside

## 2024-07-20 NOTE — ED Provider Notes (Signed)
 [place] [facility] Provider Note   CSN: [id number] Arrival date & time: 07/20/24  9150     Patient presents with: Chest Pain   ADRIEN DIETZMAN is a 58 y.o. female with history of hypertension, pulmonary embolism on Xarelto  who presents to the emergency department today with a 3-day history of intermittent substernal chest heaviness.  Chest pain is somewhat worse when lying flat.  She endorses some shortness of breath which is also worse with lying flat.  Somewhat worse with exertion.  She denies any weight gain, cough, congestion, fever, chills, nausea, vomiting, diarrhea, diaphoresis, abdominal pain.  Patient has not missed any doses of her Xarelto  besides this morning.  She currently rates her chest pain 8/10 in severity.    Chest Pain      Prior to Admission medications   Medication Sig Start Date End Date Taking? Authorizing Provider  aspirin  81 MG chewable tablet Chew 81 mg by mouth daily.    [provider]  azithromycin  (ZITHROMAX ) 250 MG tablet Take 1 tablet (250 mg total) by mouth daily. Take first 2 tablets together, then 1 every day until finished. 10/30/23   Curatolo, Adam, DO  cetirizine  (ZYRTEC  ALLERGY) 10 MG tablet Take 1 tablet (10 mg total) by mouth daily. 03/05/23   Rising, Asberry, PA-C  vitamin D3 (CHOLECALCIFEROL ) 25 MCG tablet Take 2 tablets (2,000 Units total) by mouth 2 (two) times daily. 06/14/23   Love, Sharlet RAMAN, PA-C  cyclobenzaprine  (FLEXERIL ) 5 MG tablet Take 1 tablet (5 mg total) by mouth 3 (three) times daily as needed for muscle spasms. 06/14/23   Love, Sharlet RAMAN, PA-C  diazepam  (VALIUM ) 10 MG tablet Take 0.5 tablets (5 mg total) by mouth every 12 (twelve) hours as needed for anxiety. 06/14/23   Love, Sharlet RAMAN, PA-C  fluticasone  (FLONASE ) 50 MCG/ACT nasal spray Place 2 sprays into both nostrils daily. 03/05/23   Rising, Asberry, PA-C  furosemide  (LASIX ) 40 MG tablet Take 1 tablet (40 mg total) by mouth daily. 06/14/23    Love, Sharlet RAMAN, PA-C  gabapentin  (NEURONTIN ) 300 MG capsule Take 1 capsule (300 mg total) by mouth 3 (three) times daily. 06/14/23   Love, Sharlet RAMAN, PA-C  lidocaine  (LIDODERM ) 5 % Place 2 patches onto the skin daily. Remove & Discard patch within 12 hours or as directed by MD 06/14/23   Love, Sharlet RAMAN, PA-C  magnesium  oxide (MAG-OX) 400 MG tablet Take 0.5 tablets (200 mg total) by mouth daily. 06/14/23   Love, Sharlet RAMAN, PA-C  meclizine  (ANTIVERT ) 25 MG tablet Take 1 tablet (25 mg total) by mouth 3 (three) times daily as needed for dizziness. 05/22/24   Geiple, Joshua, PA-C  melatonin 5 MG TABS Take 1 tablet (5 mg total) by mouth at bedtime as needed. 06/14/23   Love, Sharlet RAMAN, PA-C  Menthol , Topical Analgesic, (ABSORBINE JR BACK PATCH EX) Apply 1 patch topically daily as needed (for back pain).    [provider]  methocarbamol  (ROBAXIN ) 750 MG tablet Take 1 tablet (750 mg total) by mouth every 6 (six) hours. 06/14/23   Love, Sharlet RAMAN, PA-C  naloxone  (NARCAN ) nasal spray 4 mg/0.1 mL Place 1 spray into the nose as needed. In case of overdose 06/14/23   Love, Sharlet RAMAN, PA-C  omeprazole  (PRILOSEC) 40 MG capsule Take 40 mg by mouth daily.    [provider]  ondansetron  (ZOFRAN ) 4 MG tablet Take 1 tablet (4 mg total) by mouth every 6 (six) hours.  04/30/24   Hinnant, Collin F, PA-C  ondansetron  (ZOFRAN -ODT) 4 MG disintegrating tablet Take 1 tablet (4 mg total) by mouth every 8 (eight) hours as needed for nausea or vomiting. 05/22/24   Desiderio Chew, PA-C  oxyCODONE  (OXY IR/ROXICODONE ) 5 MG immediate release tablet Take 1-2 tablets (5-10 mg total) by mouth every 4 (four) hours as needed (5mg  for paim 5-7 level and 10 mg for pain > level 7). 06/14/23   Love, Sharlet RAMAN, PA-C  phentermine  (ADIPEX-P ) 37.5 MG tablet Take 37.5 mg by mouth daily. 04/21/24   [provider]  polyethylene glycol powder (GLYCOLAX /MIRALAX ) 17 GM/SCOOP powder Take 1 capful (17 g) by mouth 2 (two) times daily. 06/14/23   Love,  Sharlet RAMAN, PA-C  potassium chloride  SA (KLOR-CON  M) 20 MEQ tablet Take 1 tablet (20 mEq total) by mouth 2 (two) times daily. 06/14/23   Love, Sharlet RAMAN, PA-C  rivaroxaban  (XARELTO ) 20 MG TABS tablet Take 1 tablet (20 mg total) by mouth daily with supper. 06/14/23   Love, Sharlet RAMAN, PA-C  traMADol  (ULTRAM ) 50 MG tablet Take 1 tablet (50 mg total) by mouth every 6 (six) hours. 06/14/23   Love, Sharlet RAMAN, PA-C    Allergies: Contrast media [iodinated contrast media], Percocet [oxycodone -acetaminophen ], Toradol  [ketorolac  tromethamine ], Tylenol  [acetaminophen ], and Vicodin [hydrocodone -acetaminophen ]    Review of Systems  Cardiovascular:  Positive for chest pain.  All other systems reviewed and are negative.   Updated Vital Signs BP 125/79   Pulse (!) 49   Temp (!) 97.4 F (36.3 C)   Resp 19   Ht 5' 5 (1.651 m)   Wt 122.5 kg   LMP 12/28/2015 (Exact Date)   SpO2 100%   BMI 44.93 kg/m   Physical Exam Vitals and nursing note reviewed.  Constitutional:      General: She is not in acute distress.    Appearance: Normal appearance.  HENT:     Head: Normocephalic and atraumatic.  Eyes:     General:        Right eye: No discharge.        Left eye: No discharge.  Cardiovascular:     Comments: Regular rate and rhythm.  S1/S2 are distinct without any evidence of murmur, rubs, or gallops.  Radial pulses are 2+ bilaterally.  Dorsalis pedis pulses are 2+ bilaterally.  No evidence of pedal edema. Pulmonary:     Comments: Clear to auscultation bilaterally.  Normal effort.  No respiratory distress.  No evidence of wheezes, rales, or rhonchi heard throughout. Abdominal:     General: Abdomen is flat. Bowel sounds are normal. There is no distension.     Tenderness: There is no abdominal tenderness. There is no guarding or rebound.  Musculoskeletal:        General: Normal range of motion.     Cervical back: Neck supple.  Skin:    General: Skin is warm and dry.     Findings: No rash.  Neurological:      General: No focal deficit present.     Mental Status: She is alert.  Psychiatric:        Mood and Affect: Mood normal.        Behavior: Behavior normal.     (all labs ordered are listed, but only abnormal results are displayed) Labs Reviewed  BASIC METABOLIC PANEL WITH GFR - Abnormal; Notable for the following components:      Result Value   Sodium 134 (*)    Calcium 8.7 (*)  All other components within normal limits  I-STAT CHEM 8, ED - Abnormal; Notable for the following components:   Calcium, Ion 1.11 (*)    All other components within normal limits  RESP PANEL BY RT-PCR (RSV, FLU A&B, COVID)  RVPGX2  CBC  BRAIN NATRIURETIC PEPTIDE  TROPONIN I (HIGH SENSITIVITY)  TROPONIN I (HIGH SENSITIVITY)    EKG: EKG Interpretation Date/Time:  Sunday July 20 2024 09:01:46 EDT Ventricular Rate:  55 PR Interval:  177 QRS Duration:  111 QT Interval:  450 QTC Calculation: 431 R Axis:   -47  Text Interpretation: Sinus rhythm LAD, consider left anterior fascicular block Borderline T wave abnormalities No significant change since last tracing Confirmed by Dasie Faden ([id number]) on 07/20/2024 11:02:52 AM  Radiology: [facility] Chest Portable 1 View Result Date: 07/20/2024 CLINICAL DATA:  58 year old female with chest pain for 3 days. Shortness of breath. EXAM: PORTABLE CHEST 1 VIEW COMPARISON:  Chest radiographs 04/30/2024 and earlier. FINDINGS: Portable AP upright view at 0910 hours. Stable lung volumes. Mediastinal contours are stable and within normal limits. Visualized tracheal air column is within normal limits. Allowing for portable technique the lungs are clear. No pneumothorax or pleural effusion. Paucity of bowel gas. No acute osseous abnormality identified. IMPRESSION: Negative portable chest. Electronically Signed   By: VEAR Hurst M.D.   On: 07/20/2024 09:44     Procedures   Medications Ordered in the ED  methylPREDNISolone  sodium succinate  (SOLU-MEDROL ) 40 mg/mL injection 40 mg (0  mg Intravenous Hold 07/20/24 1458)  diphenhydrAMINE  (BENADRYL ) capsule 50 mg (has no administration in time range)    Or  diphenhydrAMINE  (BENADRYL ) injection 50 mg (has no administration in time range)  morphine  (PF) 4 MG/ML injection 4 mg (4 mg Intravenous Given 07/20/24 0912)  dicyclomine  (BENTYL ) capsule 10 mg (10 mg Oral Given 07/20/24 1130)  sodium chloride  0.9 % bolus 500 mL (0 mLs Intravenous Stopped 07/20/24 1446)  morphine  (PF) 4 MG/ML injection 4 mg (4 mg Intravenous Given 07/20/24 1255)  morphine  (PF) 4 MG/ML injection 4 mg (4 mg Intravenous Given 07/20/24 1455)    Clinical Course as of 07/20/24 1530  Sun Jul 20, 2024  0927 CBC Negative.  [CF]  9072 I-stat chem 8, ED (not at [facility], [facility] or [facility])(!) Negative.  [CF]  1021 Brain natriuretic peptide Negative.  [CF]  1021 Troponin I (High Sensitivity) Initial troponin is normal.  [CF]  1021 Basic metabolic panel(!) Mild hyponatremia.  [CF]  1021 DG Chest Portable 1 View No evidence of pneumonia or pulmonary edema. I do agree with the radiologist interpretation.  [CF]  1245 Patient still complaining of pain.  Will plan to give her some fluids and some her pain medication. [CF]  1325 Troponin I (High Sensitivity) Delta troponin are normal.  [CF]  1445 On repeat evaluation, patient states that she is still having some chest pressure rating to the back and she states that it does feel like pressure and tearing at the same time.  Will proceed with a dissection study. [CF]  1507 I spoke with Dr. Michele with cardiology who agrees to consult on the patient. [CF]  1508 I was notified by CT that since the patient had anaphylactic reaction to contrast in the past she needs to be premedicated for 13 hours prior to getting the CT dissection study.  I do not think this was will change overall patient management and believe she can be admitted.  [CF]    Clinical Course User Index [CF] Theotis Cameron HERO,  PA-C    Medical Decision Making LIVI MCGANN  is a 58 y.o. female patient who presents to the emergency department today for further evaluation of chest pain. Exam without evidence of volume overload so doubt heart failure. EKG without signs of active ischemia. Given the timing of pain to ER presentation, single troponin normal delta troponin was normal so doubt NSTEMI. Presentation not consistent with acute PE anticoagulated,pneumothorax (not visualized on chest xr), thoracic aortic dissection, pericarditis, tamponade, pneumonia (no infectious symptoms, clear chest xr), myocarditis (no recent illness, neg trop). HEART score: 3  so plan to admit patient for chest pain rule out.   Given that the patient is still having chest pain despite negative workup I do feel the patient would likely benefit in further evaluations at hospital.  CTA dissection still pending.  Due to shift change, eventually patient's care will be transferred to [facility] ultimate disposition be made.    Amount and/or Complexity of Data Reviewed Labs: ordered. Decision-making details documented in ED Course. Radiology: ordered. Decision-making details documented in ED Course.  Risk Prescription drug management. Decision regarding hospitalization.     Final diagnoses:  Chest pain, unspecified type    ED Discharge Orders     None          Theotis Cameron CHRISTELLA DEVONNA 07/20/24 1530    Dasie Faden, MD 07/22/24 1645

## 2024-07-20 NOTE — ED Provider Notes (Signed)
    Accepted handoff at shift change from [facility]. Please see prior provider note for more detail.   Briefly: Patient is 58 y.o. presents to the emergency department today with a 3-day history of intermittent substernal chest heaviness. Chest pain is somewhat worse when lying flat. She endorses some shortness of breath which is also worse with lying flat. Somewhat worse with exertion. She denies any weight gain, cough, congestion, fever, chills, nausea, vomiting, diarrhea, diaphoresis, abdominal pain. Patient has not missed any doses of her Xarelto  besides this morning. She currently rates her chest pain 8/10 in severity.  Plan:  - plan was to admit patient for high-risk chest pain; however, Dr. Kate with cardiology was able to assess patient in ED room. Pain is reproducible to palpation. Overall, given recent reassuring chest pain workups and physical exam, he is less concerned that pain is coming from emergent cause today. After conversation, cardiologist recommending moving forward with d-dimer test. If this is positive, we would then get MRA chest to further assess for dissection since this test would not expose patient to anaphylaxis triggers and would be able to assess the same process. If these tests are reassuring, they will follow in the outpatient setting.  - d-dimer elevated slightly to 0.61. MRA chest without concern for dissection - no acute concerns. Of note, we did ask Dr. 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  - Patient stating that she was coughing last week which I believe contributed to her costochondritis today.  Educated patient on OTC medications for her pain.  Will refer patient to cardiology for further outpatient workup.  Patient agrees with plan. - Patient afebrile with stable vitals.  Provided with return precautions.  Discharged in good condition.      Hoy Nidia FALCON, [place] 07/20/24 1952    Dasie Faden, MD 07/22/24 [id number]

## 2024-07-20 NOTE — Discharge Instructions (Addendum)
 As discussed, you will need to follow-up with your primary care provider and cardiologist.  Seek emergency care if experiencing any new or worsening symptoms. You make take over-the-counter anti-inflammatory medications for your pain such as tylenol , lidocaine  patches, and applying ice.

## 2024-07-20 NOTE — ED Triage Notes (Signed)
 Pt c.o generalized chest pain x 3 days with cough and congestion. Denies fever

## 2024-07-20 NOTE — ED Notes (Signed)
 Patient transported to MRI

## 2024-07-20 NOTE — ED Triage Notes (Addendum)
 Quick triage note- pt endorses generalized CP for 3 days. Endorses SOB. Denies n/v.

## 2024-07-20 NOTE — Consult Note (Addendum)
 Cardiology Consultation   Patient ID: JAMARA VARY MRN: [id number]; DOB: Nov 03, 1966  Admit date: 07/20/2024 Date of Consult: 07/20/2024  PCP:  Sim Emery CROME, MD   [facility] Providers Cardiologist:  None     Patient Profile: Katie [name] is a 58 y.o. female with a hx of atypical CP (normal cath and multiple stress test), HTN, PE on Xarelto  (2014), bipolar disorder, depression, IBS, right carpal tunnel, GERD who is being seen 07/20/2024 for the evaluation of chest pain at the request of Dr. Dasie.  History of Present Illness: Ms. Julian was last seen by heart care in consult in 2017 for chest pain.  Negative troponins/D-dimer.  EKG NSR without significant changes.  Echo unremarkable with EF 60 to 65%, no RWMA, mildly dilated LA, mildly elevated PASP.  Noted previous cath and several stress test in the past were reportedly negative.  Suspected noncardiac chest pain most likely MSK.  Patient has been lost to follow-up since that time.  Most recent exercise treadmill stress test 04/01/2021 showed low risk study and stress EKG negative for ischemia.  Most recent echo 07/2021 showed EF 55 to 60%, mild LVH, trivial MV regurgitation.  Presented to ED today for CP and SOB. Normal labs include K4, CR 0.78, BNP 9.6, TN 4>3, CBC EKG: Sinus bradycardia, HR 55, LAFB, nonspecific T wave abnormalities (no significant change compared to 05/2024) CXR without any acute findings. CTA chest/abdomen/pelvis for dissection pending. Treated with morphine  IV X3 and NaCl bolus.  On interview, patient reported intermittent chest pain for 3 days that started when she was laying down in bed trying to go to sleep.  CP described as tightness and like someone sitting on chest 8/10, located midsternal radiating to back, both shoulder blades, left side of neck.  CP has progressively worsened with longer duration and now still present constantly. CP improves with laying flat or sitting forward but is worse when  laying on the side or bending over.  No relief with ibuprofen .  Morphine  relieved for 10 to 15 minutes.  Associated with more intense SOB and numbness/tingling in the left hand.  Notes history of SOB with minimal exertion.  Also notes history of LE edema that has been unchanged.  Denies any diaphoresis, nausea/vomiting, orthopnea, PND, syncope.  Reports compliance with Xarelto  and only missed dose this morning.  Notes most recent heavy lifting is picking up her grandchild.  Smokes marijuana 2-3 times per week, drinks EtOH 2-3 times per week, and denies tobacco use.  Patient does have history of acid reflux and notes compliance to PPI.  Past Medical History:  Diagnosis Date   Anxiety    Back pain    Bipolar 1 disorder (HCC)    Chronic pain entered 08/15/2015   Treated with Oxycodone    Depression    Depression    Dyspnea    History of acute bronchitis    HTN (hypertension)    OA (osteoarthritis)    Obesity    PE (pulmonary embolism)    oct 2014   Sciatica     Past Surgical History:  Procedure Laterality Date   ADENOIDECTOMY     BIOPSY STOMACH     CARDIAC CATHETERIZATION  2009   normal; in [place]    CESAREAN SECTION  2002   x 2, 1997 and 2002   CHOLECYSTECTOMY  2007   EXTERNAL FIXATION LEG Bilateral 05/27/2023   Procedure: EXTERNAL FIXATION ANKLE;  Surgeon: Josefina Chew, MD;  Location: [facility] OR;  Service: Orthopedics;  Laterality: Bilateral;   EXTERNAL FIXATION REMOVAL Left 06/11/2023   Procedure: REMOVAL EXTERNAL FIXATION LEG;  Surgeon: Celena Sharper, MD;  Location: [facility] OR;  Service: Orthopedics;  Laterality: Left;   KNEE SURGERY Right 2005   OPEN REDUCTION INTERNAL FIXATION (ORIF) TIBIA/FIBULA FRACTURE Left 05/31/2023   Procedure: OPEN REDUCTION INTERNAL FIXATION (ORIF) PILON FRACTURE;  Surgeon: Celena Sharper, MD;  Location: [facility] OR;  Service: Orthopedics;  Laterality: Left;   OPEN REDUCTION INTERNAL FIXATION (ORIF) TIBIA/FIBULA FRACTURE Left 06/11/2023   Procedure: OPEN REDUCTION  INTERNAL FIXATION (ORIF) LEFT PILON FRACTURE;  Surgeon: Celena Sharper, MD;  Location: [facility] OR;  Service: Orthopedics;  Laterality: Left;   ORIF ANKLE FRACTURE Right 05/31/2023   Procedure: OPEN REDUCTION INTERNAL FIXATION (ORIF) ANKLE FRACTURE;  Surgeon: Celena Sharper, MD;  Location: [facility] OR;  Service: Orthopedics;  Laterality: Right;   TONSILLECTOMY     TOTAL KNEE ARTHROPLASTY Left 01/27/2020   Procedure: TOTAL KNEE ARTHROPLASTY;  Surgeon: Beverley Evalene BIRCH, MD;  Location: [facility] ORS;  Service: Orthopedics;  Laterality: Left;   TYMPANOSTOMY TUBE PLACEMENT       Home Medications:  Prior to Admission medications   Medication Sig Start Date End Date Taking? Authorizing Provider  aspirin  81 MG chewable tablet Chew 81 mg by mouth daily.    [provider]  azithromycin  (ZITHROMAX ) 250 MG tablet Take 1 tablet (250 mg total) by mouth daily. Take first 2 tablets together, then 1 every day until finished. 10/30/23   Curatolo, Adam, DO  cetirizine  (ZYRTEC  ALLERGY) 10 MG tablet Take 1 tablet (10 mg total) by mouth daily. 03/05/23   Rising, Asberry, PA-C  vitamin D3 (CHOLECALCIFEROL ) 25 MCG tablet Take 2 tablets (2,000 Units total) by mouth 2 (two) times daily. 06/14/23   Love, Sharlet RAMAN, PA-C  cyclobenzaprine  (FLEXERIL ) 5 MG tablet Take 1 tablet (5 mg total) by mouth 3 (three) times daily as needed for muscle spasms. 06/14/23   Love, Sharlet RAMAN, PA-C  diazepam  (VALIUM ) 10 MG tablet Take 0.5 tablets (5 mg total) by mouth every 12 (twelve) hours as needed for anxiety. 06/14/23   Love, Sharlet RAMAN, PA-C  fluticasone  (FLONASE ) 50 MCG/ACT nasal spray Place 2 sprays into both nostrils daily. 03/05/23   Rising, Asberry, PA-C  furosemide  (LASIX ) 40 MG tablet Take 1 tablet (40 mg total) by mouth daily. 06/14/23   Love, Sharlet RAMAN, PA-C  gabapentin  (NEURONTIN ) 300 MG capsule Take 1 capsule (300 mg total) by mouth 3 (three) times daily. 06/14/23   Love, Sharlet RAMAN, PA-C  lidocaine  (LIDODERM ) 5 % Place 2 patches onto the skin daily.  Remove & Discard patch within 12 hours or as directed by MD 06/14/23   Love, Sharlet RAMAN, PA-C  magnesium  oxide (MAG-OX) 400 MG tablet Take 0.5 tablets (200 mg total) by mouth daily. 06/14/23   Love, Sharlet RAMAN, PA-C  meclizine  (ANTIVERT ) 25 MG tablet Take 1 tablet (25 mg total) by mouth 3 (three) times daily as needed for dizziness. 05/22/24   Geiple, Joshua, PA-C  melatonin 5 MG TABS Take 1 tablet (5 mg total) by mouth at bedtime as needed. 06/14/23   Love, Sharlet RAMAN, PA-C  Menthol , Topical Analgesic, (ABSORBINE JR BACK PATCH EX) Apply 1 patch topically daily as needed (for back pain).    [provider]  methocarbamol  (ROBAXIN ) 750 MG tablet Take 1 tablet (750 mg total) by mouth every 6 (six) hours. 06/14/23   Love, Sharlet RAMAN, PA-C  naloxone  (NARCAN ) nasal spray 4 mg/0.1 mL Place 1 spray into the  nose as needed. In case of overdose 06/14/23   Love, Sharlet RAMAN, PA-C  omeprazole  (PRILOSEC) 40 MG capsule Take 40 mg by mouth daily.    [provider]  ondansetron  (ZOFRAN ) 4 MG tablet Take 1 tablet (4 mg total) by mouth every 6 (six) hours. 04/30/24   Hinnant, Collin F, PA-C  ondansetron  (ZOFRAN -ODT) 4 MG disintegrating tablet Take 1 tablet (4 mg total) by mouth every 8 (eight) hours as needed for nausea or vomiting. 05/22/24   Desiderio Chew, PA-C  oxyCODONE  (OXY IR/ROXICODONE ) 5 MG immediate release tablet Take 1-2 tablets (5-10 mg total) by mouth every 4 (four) hours as needed (5mg  for paim 5-7 level and 10 mg for pain > level 7). 06/14/23   Love, Sharlet RAMAN, PA-C  phentermine  (ADIPEX-P ) 37.5 MG tablet Take 37.5 mg by mouth daily. 04/21/24   [provider]  polyethylene glycol powder (GLYCOLAX /MIRALAX ) 17 GM/SCOOP powder Take 1 capful (17 g) by mouth 2 (two) times daily. 06/14/23   Love, Sharlet RAMAN, PA-C  potassium chloride  SA (KLOR-CON  M) 20 MEQ tablet Take 1 tablet (20 mEq total) by mouth 2 (two) times daily. 06/14/23   Love, Sharlet RAMAN, PA-C  rivaroxaban  (XARELTO ) 20 MG TABS tablet Take 1 tablet (20 mg  total) by mouth daily with supper. 06/14/23   Love, Sharlet RAMAN, PA-C  traMADol  (ULTRAM ) 50 MG tablet Take 1 tablet (50 mg total) by mouth every 6 (six) hours. 06/14/23   Love, Sharlet RAMAN, PA-C    Scheduled Meds:  diphenhydrAMINE   50 mg Oral Once   Or   diphenhydrAMINE   50 mg Intravenous Once   methylPREDNISolone  (SOLU-MEDROL ) injection  40 mg Intravenous Once    morphine  injection  4 mg Intravenous Once   Continuous Infusions:  PRN Meds:   Allergies:    Allergies  Allergen Reactions   Contrast Media [Iodinated Contrast Media] Anaphylaxis   Percocet [Oxycodone -Acetaminophen ] Hives and Other (See Comments)    From the Tylenol , she can take oxycodone  by itself   Toradol  [Ketorolac  Tromethamine ] Other (See Comments)    Not effective   Tylenol  [Acetaminophen ] Hives and Swelling   Vicodin [Hydrocodone -Acetaminophen ] Hives and Swelling    Social History:   Social History   Socioeconomic History   Marital status: Married    Spouse name: Not on file   Number of children: Not on file   Years of education: Not on file   Highest education level: Not on file  Occupational History   Occupation: Housewife  Tobacco Use   Smoking status: Never   Smokeless tobacco: Never   Tobacco comments:    second hand smoke  Vaping Use   Vaping status: Never Used  Substance and Sexual Activity   Alcohol use: Yes    Alcohol/week: 0.0 standard drinks of alcohol    Comment: 2-3 x week has a glass of wine   Drug use: Yes    Frequency: 1.0 times per week    Types: Marijuana   Sexual activity: Yes  Other Topics Concern   Not on file  Social History Narrative   Lives with husband, daughter and stepson.     Family History:   Family History  Problem Relation Age of Onset   Heart disease Mother    Diabetes Father    Asthma Sister        also had rheumatic fever as a child, died age 35     ROS:  Please see the history of present illness.  All other ROS reviewed  and negative.     Physical  Exam/Data: Vitals:   07/20/24 0930 07/20/24 1100 07/20/24 1242 07/20/24 1245  BP: 136/75 124/78  120/87  Pulse: (!) 55 (!) 52  (!) 54  Resp: 15 19  15   Temp:   (!) 97.4 F (36.3 C)   TempSrc:      SpO2: 98% 99%  100%  Weight:      Height:       No intake or output data in the 24 hours ending 07/20/24 1452    07/20/2024    8:55 AM 05/22/2024    4:32 AM 04/30/2024    5:35 PM  Last 3 Weights  Weight (lbs) 270 lb 265 lb 260 lb  Weight (kg) 122.471 kg 120.203 kg 117.935 kg     Body mass index is 44.93 kg/m.  General:  Well nourished, well developed, in no acute distress HEENT: normal Neck: no JVD Vascular: No carotid bruits; Distal pulses 2+ bilaterally Cardiac:  normal S1, S2; RRR; no murmur; reproducible mid sternal CP Lungs:  clear to auscultation bilaterally, no wheezing, rhonchi or rales  Abd: soft, nontender, no hepatomegaly  Ext: no edema Musculoskeletal:  No deformities, BUE and BLE strength normal and equal Skin: warm and dry  Neuro:  CNs 2-12 intact, no focal abnormalities noted Psych:  Normal affect   EKG:  The EKG was personally reviewed and demonstrates:  Sinus bradycardia, HR 55, LAFB, nonspecific T wave abnormalities (no significant change compared to 05/2024) Telemetry:  Telemetry was personally reviewed and demonstrates:  SB, HR 50's  Relevant CV Studies: Exercise treadmill stress test 04/01/2021: Functional status: Fair. Chest pain: None. Reason for stopping exercise: Fatigue/weakness. Hypertensive response to exercise: No. Exercise time 6 minutes 8 seconds on Bruce protocol, achieved 7.22 METS, and 88% of age-predicted maximum heart rate.  Stress ECG negative for ischemia.   Low risk study.  ECHO 07/2021 IMPRESSIONS   1. Left ventricular ejection fraction, by estimation, is 55 to 60%. The  left ventricle has normal function. The left ventricle has no regional  wall motion abnormalities. There is mild left ventricular hypertrophy.  Left ventricular  diastolic parameters  were normal.   2. Right ventricular systolic function is normal. The right ventricular  size is normal. There is normal pulmonary artery systolic pressure. The  estimated right ventricular systolic pressure is 32.2 mmHg.   3. The mitral valve is normal in structure. Trivial mitral valve  regurgitation. No evidence of mitral stenosis.   4. The aortic valve is tricuspid. Aortic valve regurgitation is not  visualized. No aortic stenosis is present.   5. The inferior vena cava is normal in size with greater than 50%  respiratory variability, suggesting right atrial pressure of 3 mmHg.   Laboratory Data: High Sensitivity Troponin:   Recent Labs  Lab 07/20/24 0857 07/20/24 1057  TROPONINIHS 4 3     Chemistry Recent Labs  Lab 07/20/24 0857 07/20/24 0918  NA 134* 140  K 4.0 4.0  CL 100 104  CO2 24  --   GLUCOSE 90 91  BUN 15 10  CREATININE 0.78 0.80  CALCIUM 8.7*  --   GFRNONAA >60  --   ANIONGAP 10  --     No results for input(s): PROT, ALBUMIN , AST, ALT, ALKPHOS, BILITOT in the last 168 hours. Lipids No results for input(s): CHOL, TRIG, HDL, LABVLDL, LDLCALC, CHOLHDL in the last 168 hours.  Hematology Recent Labs  Lab 07/20/24 0857 07/20/24 0918  WBC 9.3  --  RBC 4.36  --   HGB 13.5 14.3  HCT 42.0 42.0  MCV 96.3  --   MCH 31.0  --   MCHC 32.1  --   RDW 13.6  --   PLT 321  --    Thyroid  No results for input(s): TSH, FREET4 in the last 168 hours.  BNP Recent Labs  Lab 07/20/24 0910  BNP 9.6    DDimer No results for input(s): DDIMER in the last 168 hours.  Radiology/Studies:  DG Chest Portable 1 View Result Date: 07/20/2024 CLINICAL DATA:  58 year old female with chest pain for 3 days. Shortness of breath. EXAM: PORTABLE CHEST 1 VIEW COMPARISON:  Chest radiographs 04/30/2024 and earlier. FINDINGS: Portable AP upright view at 0910 hours. Stable lung volumes. Mediastinal contours are stable and within normal  limits. Visualized tracheal air column is within normal limits. Allowing for portable technique the lungs are clear. No pneumothorax or pleural effusion. Paucity of bowel gas. No acute osseous abnormality identified. IMPRESSION: Negative portable chest. Electronically Signed   By: VEAR Hurst M.D.   On: 07/20/2024 09:44    Assessment and Plan: Atypical chest pain Previous cath and several stress test were negative. Most recent exercise treadmill stress test 03/2021 unremarkable. Most recent echo 07/2021 with EF 55 to 60%, mild LVH, trivial MV regurgitation. Presented to ED with atypical features as described above.  CP still present and constant. BNP 9.6, TN 4>3, EKG: Sinus bradycardia, HR 55, LAFB, nonspecific T wave abnormalities (no significant change compared to 05/2024) Telemetry shows sinus bradycardia, HR 50s Reproducible chest pain on exam.  Suspect most likely MSK related. Concern for cardiac cause is low.  Could plan outpatient stress PET to rule out any evidence of ischemia.  Would hold off on coronary CTA given history of anaphylaxis to iodinated contrast Would not repeat ECHO at this time with normal BNP, no HF concerns, and previous negative ischemic testing.  CTA chest was ordered in ED to rule out dissection given pain radiating to back.  However she has a history of anaphylaxis to iodinated contrast and would require 13-hour premedication protocol.  Given relatively low suspicion for dissection, agree with checking D-dimer.  If positive, would favor MRA chest instead of CTA to evaluate for dissection  H/o PE: on xarelto   Hypertension: BP elevated initially, likely pain related, now normotensive   For questions or updates, please contact [facility] Please consult [email or web address] for contact info under    Signed, Lorette CINDERELLA Kapur, PA-C  07/20/2024 2:52 PM    Patient seen and examined.  Agree with below documentation.  Ms. Beringer is a 58 year old female with a history of  PE on Xarelto , bipolar disorder, GERD who we are consulted by Dr. Dasie for evaluation of chest pain.  She reports has been having chest pain for past 3 days.  Was occurring off-and-on but over the last nearly 24 hours has been constant chest pain.  Describes pressure in center of chest, radiating to back.  Reports worse with deep breathing or if she twists her torso.  Tender to palpation.  Reports pain was initially 9 out of 10 in intensity, currently 8 out of 10.  In ED, initial vital signs notable for BP 157/89, pulse 59, SpO2 100% on room air.  Labs notable for creatinine 0.78, troponin 4 > 3, BNP 10, hemoglobin 13.5, WBC 9.3, platelets 321.  Chest x-ray unremarkable.  EKG shows sinus rhythm, left axis deviation, nonspecific T wave flattening, rate 55.  On exam,  patient is alert and oriented, regular rate and rhythm, no murmurs, lungs CTAB, no LE edema.  For her chest pain, given continuous pain with negative troponins, suspect noncardiac chest pain.  Likely musculoskeletal, as reports worse with deep inspiration and twisting movements, and is tender to palpation.  She reports she has been lifting her grandchildren, suspect likely chest muscle injury.  CTA chest was ordered to rule out dissection given radiation to back but given her allergy to contrast will require 13-hour premedication protocol.  Low suspicion for dissection, can check D-dimer and if negative plan for discharge.  If D-dimer positive, could check MRA chest instead of CTA.  Will schedule outpatient cardiology follow-up  Lonni LITTIE Nanas, MD

## 2024-07-20 NOTE — ED Notes (Signed)
 Per rad tech, should be 3 total doses of solu @ 13 hours prior to scan, 7 hours prior to scan, and 1 hour prior to scan.

## 2024-07-21 DIAGNOSIS — M1712 Unilateral primary osteoarthritis, left knee: Secondary | ICD-10-CM | POA: Diagnosis not present

## 2024-07-22 DIAGNOSIS — M1712 Unilateral primary osteoarthritis, left knee: Secondary | ICD-10-CM | POA: Diagnosis not present

## 2024-07-25 DIAGNOSIS — M1712 Unilateral primary osteoarthritis, left knee: Secondary | ICD-10-CM | POA: Diagnosis not present

## 2024-07-26 DIAGNOSIS — M1712 Unilateral primary osteoarthritis, left knee: Secondary | ICD-10-CM | POA: Diagnosis not present

## 2024-08-03 DIAGNOSIS — M1712 Unilateral primary osteoarthritis, left knee: Secondary | ICD-10-CM | POA: Diagnosis not present

## 2024-08-04 DIAGNOSIS — M1712 Unilateral primary osteoarthritis, left knee: Secondary | ICD-10-CM | POA: Diagnosis not present

## 2024-08-04 NOTE — Progress Notes (Unsigned)
 Cardiology Office Note:  .   Date:  08/04/2024  ID:  [name], DOB 1966-03-02, MRN [id number] PCP: Sim Emery CROME, MD  [facility] Providers Cardiologist:  None {  History of Present Illness: .   Katie [name] is a 58 y.o. female  with PMHx of atypical CP (normal cath and multiple stress test), HTN, PE on Xarelto  (2014), bipolar disorder, depression, IBS, right carpal tunnel, GERD who reports to [facility] office for follow up.   [name] was last seen by heart care in consult in 2017 for chest pain.   Suspected noncardiac chest pain most likely MSK.  Patient has been lost to follow-up since that time.   Recent hospitalization 07/2024 for chest pain. EKG shows sinus rhythm, left axis deviation, nonspecific T wave flattening, rate 55.  Suspected noncardiac cause giving continuous pain with negative troponin's, reproducible pain, and worse with deep inspiration/twisting movement.  Considered CTA chest given pain radiating into her back but avoided given allergy to contrast requiring 13-hour premedication protocol.  As an alternative D-dimer was ordered and slightly elevated to 0.61.  Follow-up MRA of chest without concern for dissection.  Suspected costochondritis and educated on OTC pain medication.  Today, reports ### and denies ###.  Reports compliance with medications.  Dietary habitats:  Activity level:  Social: Denies tobacco use/alcohol/drug use .SABRASABRASmokes marijuana 2-3 times per week, drinks EtOH 2-3 times per week, and denies tobacco use.  ?? Denies any recent hospitalizations or visits to the emergency department.   Atypical chest pain Previous cath and several stress test were negative. Most recent exercise treadmill stress test 03/2021 unremarkable. Most recent echo 07/2021 with EF 55 to 60%, mild LVH, trivial MV regurgitation. Reviewed EKG 07/2024: Sinus bradycardia, HR 55, LAFB, nonspecific T wave abnormalities (no significant change compared to 05/2024)  HTN Reports  well controlled Home BP:  BP this OV well controlled today:  Continue on / Managed by GDMT as above.  Encourage physical activity for 150 minutes per week and heart healthy low sodium diet. Discussed limiting sodium intake to < 2 grams daily.     History of PE Continue on Xarelto .  ROS: 10 point review of system has been reviewed and considered negative except ones been listed in the HPI.   Studies Reviewed: SABRA   Exercise treadmill stress test 04/01/2021: Functional status: Fair. Chest pain: None. Reason for stopping exercise: Fatigue/weakness. Hypertensive response to exercise: No. Exercise time 6 minutes 8 seconds on Bruce protocol, achieved 7.22 METS, and 88% of age-predicted maximum heart rate.  Stress ECG negative for ischemia.   Low risk study.   ECHO 07/2021 IMPRESSIONS   1. Left ventricular ejection fraction, by estimation, is 55 to 60%. The  left ventricle has normal function. The left ventricle has no regional  wall motion abnormalities. There is mild left ventricular hypertrophy.  Left ventricular diastolic parameters  were normal.   2. Right ventricular systolic function is normal. The right ventricular  size is normal. There is normal pulmonary artery systolic pressure. The  estimated right ventricular systolic pressure is 32.2 mmHg.   3. The mitral valve is normal in structure. Trivial mitral valve  regurgitation. No evidence of mitral stenosis.   4. The aortic valve is tricuspid. Aortic valve regurgitation is not  visualized. No aortic stenosis is present.   5. The inferior vena cava is normal in size with greater than 50%  respiratory variability, suggesting right atrial pressure of 3 mmHg.  Risk Assessment/Calculations:   {  Does this patient have ATRIAL FIBRILLATION?:[phone]} No BP recorded.  {Refresh Note OR Click here to enter BP  :1}***       Physical Exam:   VS:  LMP 12/28/2015 (Exact Date)    Wt Readings from Last 3 Encounters:  07/20/24 270 lb (122.5  kg)  05/22/24 265 lb (120.2 kg)  04/30/24 260 lb (117.9 kg)    GEN: Well nourished, well developed in no acute distress while sitting in chair.  NECK: No JVD; No carotid bruits CARDIAC: ***RRR, no murmurs, rubs, gallops RESPIRATORY:  Clear to auscultation without rales, wheezing or rhonchi  ABDOMEN: Soft, non-tender, non-distended EXTREMITIES:  No edema; No deformity   ASSESSMENT AND PLAN: .   ***    {Are you ordering a CV Procedure (e.g. stress test, cath, DCCV, TEE, etc)?   Press F2        :[id number]}  Dispo: ***  Signed, Lorette CINDERELLA Kapur, PA-C

## 2024-08-05 DIAGNOSIS — M1712 Unilateral primary osteoarthritis, left knee: Secondary | ICD-10-CM | POA: Diagnosis not present

## 2024-08-06 ENCOUNTER — Encounter: Payer: Self-pay | Admitting: General Practice

## 2024-08-06 ENCOUNTER — Ambulatory Visit: Attending: General Practice | Admitting: Physician Assistant

## 2024-08-06 VITALS — BP 140/90 | HR 55 | Ht 65.0 in | Wt 283.3 lb

## 2024-08-06 DIAGNOSIS — R0789 Other chest pain: Secondary | ICD-10-CM | POA: Diagnosis present

## 2024-08-06 DIAGNOSIS — I2699 Other pulmonary embolism without acute cor pulmonale: Secondary | ICD-10-CM | POA: Insufficient documentation

## 2024-08-06 DIAGNOSIS — I10 Essential (primary) hypertension: Secondary | ICD-10-CM | POA: Diagnosis not present

## 2024-08-06 DIAGNOSIS — M1712 Unilateral primary osteoarthritis, left knee: Secondary | ICD-10-CM | POA: Diagnosis not present

## 2024-08-06 DIAGNOSIS — R0602 Shortness of breath: Secondary | ICD-10-CM | POA: Insufficient documentation

## 2024-08-06 DIAGNOSIS — Z7901 Long term (current) use of anticoagulants: Secondary | ICD-10-CM | POA: Insufficient documentation

## 2024-08-06 NOTE — Patient Instructions (Addendum)
 Medication Instructions:  Your physician recommends that you continue on your current medications as directed. Please refer to the Current Medication list given to you today.  *If you need a refill on your cardiac medications before your next appointment, please call your pharmacy*  Testing/Procedures: Your physician has requested that you have an echocardiogram. Echocardiography is a painless test that uses sound waves to create images of your heart. It provides your doctor with information about the size and shape of your heart and how well your heart's chambers and valves are working. This procedure takes approximately one hour. There are no restrictions for this procedure. Please do NOT wear cologne, perfume, aftershave, or lotions (deodorant is allowed). Please arrive 15 minutes prior to your appointment time.  Please note: We ask at that you not bring children with you during ultrasound (echo/ vascular) testing. Due to room size and safety concerns, children are not allowed in the ultrasound rooms during exams. Our front office staff cannot provide observation of children in our lobby area while testing is being conducted. An adult accompanying a patient to their appointment will only be allowed in the ultrasound room at the discretion of the ultrasound technician under special circumstances. We apologize for any inconvenience.   Follow-Up: At [facility], you and your health needs are our priority.  As part of our continuing mission to provide you with exceptional heart care, our providers are all part of one team.  This team includes your primary Cardiologist (physician) and Advanced Practice Providers or APPs (Physician Assistants and Nurse Practitioners) who all work together to provide you with the care you need, when you need it.  Your next appointment:   3 month(s)  Provider:   Lonni LITTIE Nanas, MD or One of our Advanced Practice Providers (APPs): Morse Clause,  PA-C  Lamarr Satterfield, NP Miriam Shams, NP  Olivia Pavy, PA-C Josefa Beauvais, NP  Leontine Salen, PA-C Orren Fabry, PA-C  [place], PA-C Ernest Dick, NP  Damien Braver, NP Jon Hails, PA-C  Waddell Donath, PA-C    Dayna Dunn, PA-C  Scott Weaver, PA-C Lum Louis, NP Katlyn West, NP Callie Goodrich, PA-C  Xika Zhao, NP Sheng Haley, PA-C    Kathleen Johnson, PA-C       We recommend signing up for the patient portal called MyChart.  Sign up information is provided on this After Visit Summary.  MyChart is used to connect with patients for Virtual Visits (Telemedicine).  Patients are able to view lab/test results, encounter notes, upcoming appointments, etc.  Non-urgent messages can be sent to your provider as well.   To learn more about what you can do with MyChart, go to [email or web address].   Other Instructions Please log your blood pressures for the next month and bring to your next appointment

## 2024-08-11 DIAGNOSIS — M1712 Unilateral primary osteoarthritis, left knee: Secondary | ICD-10-CM | POA: Diagnosis not present

## 2024-08-13 DIAGNOSIS — M1712 Unilateral primary osteoarthritis, left knee: Secondary | ICD-10-CM | POA: Diagnosis not present

## 2024-08-14 DIAGNOSIS — M1712 Unilateral primary osteoarthritis, left knee: Secondary | ICD-10-CM | POA: Diagnosis not present

## 2024-08-15 DIAGNOSIS — M1712 Unilateral primary osteoarthritis, left knee: Secondary | ICD-10-CM | POA: Diagnosis not present

## 2024-08-17 DIAGNOSIS — M1712 Unilateral primary osteoarthritis, left knee: Secondary | ICD-10-CM | POA: Diagnosis not present

## 2024-08-18 DIAGNOSIS — M1712 Unilateral primary osteoarthritis, left knee: Secondary | ICD-10-CM | POA: Diagnosis not present

## 2024-08-19 DIAGNOSIS — M1712 Unilateral primary osteoarthritis, left knee: Secondary | ICD-10-CM | POA: Diagnosis not present

## 2024-08-20 DIAGNOSIS — M1712 Unilateral primary osteoarthritis, left knee: Secondary | ICD-10-CM | POA: Diagnosis not present

## 2024-08-21 DIAGNOSIS — M1712 Unilateral primary osteoarthritis, left knee: Secondary | ICD-10-CM | POA: Diagnosis not present

## 2024-08-22 DIAGNOSIS — M1712 Unilateral primary osteoarthritis, left knee: Secondary | ICD-10-CM | POA: Diagnosis not present

## 2024-08-25 DIAGNOSIS — M1712 Unilateral primary osteoarthritis, left knee: Secondary | ICD-10-CM | POA: Diagnosis not present

## 2024-08-26 DIAGNOSIS — M1712 Unilateral primary osteoarthritis, left knee: Secondary | ICD-10-CM | POA: Diagnosis not present

## 2024-08-27 DIAGNOSIS — M1712 Unilateral primary osteoarthritis, left knee: Secondary | ICD-10-CM | POA: Diagnosis not present

## 2024-08-28 DIAGNOSIS — M1712 Unilateral primary osteoarthritis, left knee: Secondary | ICD-10-CM | POA: Diagnosis not present

## 2024-08-30 DIAGNOSIS — M1712 Unilateral primary osteoarthritis, left knee: Secondary | ICD-10-CM | POA: Diagnosis not present

## 2024-09-01 DIAGNOSIS — M1712 Unilateral primary osteoarthritis, left knee: Secondary | ICD-10-CM | POA: Diagnosis not present

## 2024-09-02 DIAGNOSIS — M1712 Unilateral primary osteoarthritis, left knee: Secondary | ICD-10-CM | POA: Diagnosis not present

## 2024-09-03 DIAGNOSIS — M1712 Unilateral primary osteoarthritis, left knee: Secondary | ICD-10-CM | POA: Diagnosis not present

## 2024-09-04 DIAGNOSIS — M1712 Unilateral primary osteoarthritis, left knee: Secondary | ICD-10-CM | POA: Diagnosis not present

## 2024-09-05 DIAGNOSIS — M1712 Unilateral primary osteoarthritis, left knee: Secondary | ICD-10-CM | POA: Diagnosis not present

## 2024-09-07 DIAGNOSIS — M1712 Unilateral primary osteoarthritis, left knee: Secondary | ICD-10-CM | POA: Diagnosis not present

## 2024-09-08 DIAGNOSIS — M1712 Unilateral primary osteoarthritis, left knee: Secondary | ICD-10-CM | POA: Diagnosis not present

## 2024-09-10 DIAGNOSIS — M1712 Unilateral primary osteoarthritis, left knee: Secondary | ICD-10-CM | POA: Diagnosis not present

## 2024-09-11 DIAGNOSIS — M1712 Unilateral primary osteoarthritis, left knee: Secondary | ICD-10-CM | POA: Diagnosis not present

## 2024-09-12 DIAGNOSIS — M1712 Unilateral primary osteoarthritis, left knee: Secondary | ICD-10-CM | POA: Diagnosis not present

## 2024-09-13 DIAGNOSIS — M1712 Unilateral primary osteoarthritis, left knee: Secondary | ICD-10-CM | POA: Diagnosis not present

## 2024-09-14 DIAGNOSIS — M1712 Unilateral primary osteoarthritis, left knee: Secondary | ICD-10-CM | POA: Diagnosis not present

## 2024-09-15 DIAGNOSIS — M1712 Unilateral primary osteoarthritis, left knee: Secondary | ICD-10-CM | POA: Diagnosis not present

## 2024-09-16 ENCOUNTER — Ambulatory Visit (HOSPITAL_COMMUNITY): Attending: Cardiovascular Disease

## 2024-09-16 ENCOUNTER — Encounter (HOSPITAL_COMMUNITY): Payer: Self-pay | Admitting: Physician Assistant

## 2024-09-16 DIAGNOSIS — M1712 Unilateral primary osteoarthritis, left knee: Secondary | ICD-10-CM | POA: Diagnosis not present

## 2024-09-17 DIAGNOSIS — M1712 Unilateral primary osteoarthritis, left knee: Secondary | ICD-10-CM | POA: Diagnosis not present

## 2024-09-18 DIAGNOSIS — M1712 Unilateral primary osteoarthritis, left knee: Secondary | ICD-10-CM | POA: Diagnosis not present

## 2024-09-20 DIAGNOSIS — M1712 Unilateral primary osteoarthritis, left knee: Secondary | ICD-10-CM | POA: Diagnosis not present

## 2024-09-22 DIAGNOSIS — M1712 Unilateral primary osteoarthritis, left knee: Secondary | ICD-10-CM | POA: Diagnosis not present

## 2024-09-23 DIAGNOSIS — M1712 Unilateral primary osteoarthritis, left knee: Secondary | ICD-10-CM | POA: Diagnosis not present

## 2024-09-25 DIAGNOSIS — M1712 Unilateral primary osteoarthritis, left knee: Secondary | ICD-10-CM | POA: Diagnosis not present

## 2024-09-28 DIAGNOSIS — M1712 Unilateral primary osteoarthritis, left knee: Secondary | ICD-10-CM | POA: Diagnosis not present

## 2024-11-24 NOTE — Progress Notes (Unsigned)
 " Cardiology Office Note:    Date:  11/24/2024   ID:  Katie [name], DOB Nov 20, 1965, MRN [id number]  PCP:  Sim Emery CROME, MD  Cardiologist:  Lonni CROME Nanas, MD  Electrophysiologist:  None   Referring MD: Sim Emery CROME, MD   No chief complaint on file. ***  History of Present Illness:    Katie [name] is a 59 y.o. female with a hx of  PE on Xarelto , bipolar disorder, GERD who presents for follow-up.  She was seen in the ED 07/2024 with chest pain.  Noted to have continuous chest pain with negative troponins, noncardiac chest pain suspected.  MRI chest showed no evidence of aortic dissection or pulmonary embolism.  Past Medical History:  Diagnosis Date   Anxiety    Back pain    Bipolar 1 disorder (HCC)    Chronic pain entered 08/15/2015   Treated with Oxycodone    Depression    Depression    Dyspnea    History of acute bronchitis    HTN (hypertension)    OA (osteoarthritis)    Obesity    PE (pulmonary embolism)    oct 2014   Sciatica     Past Surgical History:  Procedure Laterality Date   ADENOIDECTOMY     BIOPSY STOMACH     CARDIAC CATHETERIZATION  2009   normal; in [place]    CESAREAN SECTION  2002   x 2, 1997 and 2002   CHOLECYSTECTOMY  2007   EXTERNAL FIXATION LEG Bilateral 05/27/2023   Procedure: EXTERNAL FIXATION ANKLE;  Surgeon: Josefina Chew, MD;  Location: [facility] OR;  Service: Orthopedics;  Laterality: Bilateral;   EXTERNAL FIXATION REMOVAL Left 06/11/2023   Procedure: REMOVAL EXTERNAL FIXATION LEG;  Surgeon: Celena Sharper, MD;  Location: [facility] OR;  Service: Orthopedics;  Laterality: Left;   KNEE SURGERY Right 2005   OPEN REDUCTION INTERNAL FIXATION (ORIF) TIBIA/FIBULA FRACTURE Left 05/31/2023   Procedure: OPEN REDUCTION INTERNAL FIXATION (ORIF) PILON FRACTURE;  Surgeon: Celena Sharper, MD;  Location: [facility] OR;  Service: Orthopedics;  Laterality: Left;   OPEN REDUCTION INTERNAL FIXATION (ORIF) TIBIA/FIBULA FRACTURE Left 06/11/2023   Procedure: OPEN  REDUCTION INTERNAL FIXATION (ORIF) LEFT PILON FRACTURE;  Surgeon: Celena Sharper, MD;  Location: [facility] OR;  Service: Orthopedics;  Laterality: Left;   ORIF ANKLE FRACTURE Right 05/31/2023   Procedure: OPEN REDUCTION INTERNAL FIXATION (ORIF) ANKLE FRACTURE;  Surgeon: Celena Sharper, MD;  Location: [facility] OR;  Service: Orthopedics;  Laterality: Right;   TONSILLECTOMY     TOTAL KNEE ARTHROPLASTY Left 01/27/2020   Procedure: TOTAL KNEE ARTHROPLASTY;  Surgeon: Beverley Evalene JONETTA, MD;  Location: [facility] ORS;  Service: Orthopedics;  Laterality: Left;   TYMPANOSTOMY TUBE PLACEMENT      Current Medications: Active Medications[1]   Allergies:   Contrast media [iodinated contrast media], Percocet [oxycodone -acetaminophen ], Toradol  [ketorolac  tromethamine ], Tylenol  [acetaminophen ], and Vicodin [hydrocodone -acetaminophen ]   Social History   Socioeconomic History   Marital status: Married    Spouse name: Not on file   Number of children: Not on file   Years of education: Not on file   Highest education level: Not on file  Occupational History   Occupation: Housewife  Tobacco Use   Smoking status: Never   Smokeless tobacco: Never   Tobacco comments:    second hand smoke  Vaping Use   Vaping status: Never Used  Substance and Sexual Activity   Alcohol use: Yes    Alcohol/week: 0.0 standard drinks of alcohol    Comment: 2-3  x week has a glass of wine   Drug use: Yes    Frequency: 1.0 times per week    Types: Marijuana   Sexual activity: Yes  Other Topics Concern   Not on file  Social History Narrative   Lives with husband, daughter and stepson.   Social Drivers of Health   Tobacco Use: Low Risk (08/06/2024)   Patient History    Smoking Tobacco Use: Never    Smokeless Tobacco Use: Never    Passive Exposure: Not on file  Financial Resource Strain: Not on file  Food Insecurity: No Food Insecurity (05/26/2023)   Hunger Vital Sign    Worried About Running Out of Food in the Last Year: Never true     Ran Out of Food in the Last Year: Never true  Transportation Needs: No Transportation Needs (05/26/2023)   PRAPARE - Administrator, Civil Service (Medical): No    Lack of Transportation (Non-Medical): No  Physical Activity: Not on file  Stress: Not on file  Social Connections: Not on file  Depression (EYV7-0): Not on file  Alcohol Screen: Not on file  Housing: Patient Declined (05/26/2023)   Housing    Last Housing Risk Score: 0  Utilities: Not At Risk (05/26/2023)   AHC Utilities    Threatened with loss of utilities: No  Health Literacy: Not on file     Family History: The patient's ***family history includes Asthma in her sister; Diabetes in her father; Heart disease in her mother.  ROS:   Please see the history of present illness.    *** All other systems reviewed and are negative.  EKGs/Labs/Other Studies Reviewed:    The following studies were reviewed today: ***  EKG:  EKG is *** ordered today.  The ekg ordered today demonstrates ***  Recent Labs: 05/21/2024: ALT 7 07/20/2024: B Natriuretic Peptide 9.6; BUN 10; Creatinine, Ser 0.80; Hemoglobin 14.3; Platelets 321; Potassium 4.0; Sodium 140  Recent Lipid Panel    Component Value Date/Time   CHOL 181 07/27/2021 0416   TRIG 20 07/27/2021 0416   HDL 76 07/27/2021 0416   CHOLHDL 2.4 07/27/2021 0416   VLDL 4 07/27/2021 0416   LDLCALC 101 (H) 07/27/2021 0416    Physical Exam:    VS:  LMP 12/28/2015     Wt Readings from Last 3 Encounters:  08/06/24 283 lb 4.8 oz (128.5 kg)  07/20/24 270 lb (122.5 kg)  05/22/24 265 lb (120.2 kg)     GEN: *** Well nourished, well developed in no acute distress HEENT: Normal NECK: No JVD; No carotid bruits LYMPHATICS: No lymphadenopathy CARDIAC: ***RRR, no murmurs, rubs, gallops RESPIRATORY:  Clear to auscultation without rales, wheezing or rhonchi  ABDOMEN: Soft, non-tender, non-distended MUSCULOSKELETAL:  No edema; No deformity  SKIN: Warm and dry NEUROLOGIC:   Alert and oriented x 3 PSYCHIATRIC:  Normal affect   ASSESSMENT:    No diagnosis found. PLAN:    Chest pain: Presented with chest pain to ED 07/2024.  Given continuous chest pain with negative troponins, noncardiac chest pain suspected.  MRI chest showed no evidence of aortic dissection or pulmonary embolism.  History of PE: On Xarelto   Lower extremity edema: On Lasix  40 mg daily***  RTC in***   Medication Adjustments/Labs and Tests Ordered: Current medicines are reviewed at length with the patient today.  Concerns regarding medicines are outlined above.  No orders of the defined types were placed in this encounter.  No orders of the defined types  were placed in this encounter.   There are no Patient Instructions on file for this visit.   Signed, Lonni LITTIE Nanas, MD  11/24/2024 10:28 PM    [facility]    [1]  No outpatient medications have been marked as taking for the 11/27/24 encounter (Appointment) with Nanas Lonni LITTIE, MD.   "

## 2024-11-26 ENCOUNTER — Emergency Department (HOSPITAL_COMMUNITY)
Admission: EM | Admit: 2024-11-26 | Discharge: 2024-11-27 | Disposition: A | Discharge status: Home / Self Care | Attending: Emergency Medicine | Admitting: Emergency Medicine

## 2024-11-26 ENCOUNTER — Emergency Department (HOSPITAL_COMMUNITY)

## 2024-11-26 ENCOUNTER — Other Ambulatory Visit: Payer: Self-pay

## 2024-11-26 ENCOUNTER — Encounter (HOSPITAL_COMMUNITY): Payer: Self-pay

## 2024-11-26 DIAGNOSIS — R109 Unspecified abdominal pain: Secondary | ICD-10-CM | POA: Diagnosis present

## 2024-11-26 DIAGNOSIS — Z86711 Personal history of pulmonary embolism: Secondary | ICD-10-CM | POA: Diagnosis not present

## 2024-11-26 DIAGNOSIS — Z7901 Long term (current) use of anticoagulants: Secondary | ICD-10-CM | POA: Diagnosis not present

## 2024-11-26 DIAGNOSIS — I10 Essential (primary) hypertension: Secondary | ICD-10-CM | POA: Diagnosis not present

## 2024-11-26 DIAGNOSIS — Z7982 Long term (current) use of aspirin: Secondary | ICD-10-CM | POA: Insufficient documentation

## 2024-11-26 DIAGNOSIS — R112 Nausea with vomiting, unspecified: Secondary | ICD-10-CM | POA: Diagnosis not present

## 2024-11-26 DIAGNOSIS — R197 Diarrhea, unspecified: Secondary | ICD-10-CM | POA: Insufficient documentation

## 2024-11-26 LAB — CBC
HCT: 44.6 % (ref 36.0–46.0)
Hemoglobin: 15.1 g/dL — ABNORMAL HIGH (ref 12.0–15.0)
MCH: 32.1 pg (ref 26.0–34.0)
MCHC: 33.9 g/dL (ref 30.0–36.0)
MCV: 94.9 fL (ref 80.0–100.0)
Platelets: 304 K/uL (ref 150–400)
RBC: 4.7 MIL/uL (ref 3.87–5.11)
RDW: 13.3 % (ref 11.5–15.5)
WBC: 9.6 K/uL (ref 4.0–10.5)
nRBC: 0 % (ref 0.0–0.2)

## 2024-11-26 LAB — COMPREHENSIVE METABOLIC PANEL WITH GFR
ALT: 7 U/L (ref 0–44)
AST: 17 U/L (ref 15–41)
Albumin: 4.1 g/dL (ref 3.5–5.0)
Alkaline Phosphatase: 111 U/L (ref 38–126)
Anion gap: 14 (ref 5–15)
BUN: 7 mg/dL (ref 6–20)
CO2: 24 mmol/L (ref 22–32)
Calcium: 9.6 mg/dL (ref 8.9–10.3)
Chloride: 100 mmol/L (ref 98–111)
Creatinine, Ser: 0.68 mg/dL (ref 0.44–1.00)
GFR, Estimated: >60 mL/min
Glucose, Bld: 106 mg/dL — ABNORMAL HIGH (ref 70–99)
Potassium: 4.3 mmol/L (ref 3.5–5.1)
Sodium: 138 mmol/L (ref 135–145)
Total Bilirubin: 0.5 mg/dL (ref 0.0–1.2)
Total Protein: 8.4 g/dL — ABNORMAL HIGH (ref 6.5–8.1)

## 2024-11-26 LAB — TROPONIN T, HIGH SENSITIVITY
Troponin T High Sensitivity: 14 ng/L (ref 0–19)
Troponin T High Sensitivity: 19 ng/L (ref 0–19)

## 2024-11-26 LAB — LIPASE, BLOOD: Lipase: 13 U/L (ref 11–51)

## 2024-11-26 MED ORDER — MORPHINE SULFATE (PF) 4 MG/ML IV SOLN
4.0000 mg | Freq: Once | INTRAVENOUS | Status: AC
  Administered 2024-11-26: 4 mg via INTRAVENOUS
  Filled 2024-11-26: qty 1

## 2024-11-26 MED ORDER — ONDANSETRON 4 MG PO TBDP
4.0000 mg | ORAL_TABLET | Freq: Once | ORAL | Status: AC | PRN
  Administered 2024-11-26: 4 mg via ORAL
  Filled 2024-11-26: qty 1

## 2024-11-26 MED ORDER — SODIUM CHLORIDE 0.9 % IV BOLUS
1000.0000 mL | Freq: Once | INTRAVENOUS | Status: AC
  Administered 2024-11-26: 1000 mL via INTRAVENOUS

## 2024-11-26 MED ORDER — ONDANSETRON HCL 4 MG/2ML IJ SOLN
4.0000 mg | Freq: Once | INTRAMUSCULAR | Status: AC
  Administered 2024-11-26: 4 mg via INTRAVENOUS
  Filled 2024-11-26: qty 2

## 2024-11-26 NOTE — ED Provider Notes (Addendum)
 " [place] [facility] Provider Note   CSN: [id number] Arrival date & time: 11/26/24  8267     Patient presents with: Abdominal Pain, Emesis, and Diarrhea  HPI Katie [name] is a 59 y.o. female with h/o PE on xarelto , s/p cholecystectomy, chronic pain, HTN presenting for abdominal pain with nausea vomiting diarrhea.  Started all of a sudden at 6 AM.  Pain is generalized but has been worse in the right lower quadrant.  States she has been persistently vomiting and having diarrhea since arrival.    Abdominal Pain Associated symptoms: diarrhea and vomiting   Emesis Associated symptoms: abdominal pain and diarrhea   Diarrhea Associated symptoms: abdominal pain and vomiting        Prior to Admission medications  Medication Sig Start Date End Date Taking? Authorizing Provider  ondansetron  (ZOFRAN ) 4 MG tablet Take 1 tablet (4 mg total) by mouth every 6 (six) hours. 11/27/24  Yes Lang Norleen POUR, PA-C  aspirin  81 MG chewable tablet Chew 81 mg by mouth daily.    [provider]  azithromycin  (ZITHROMAX ) 250 MG tablet Take 1 tablet (250 mg total) by mouth daily. Take first 2 tablets together, then 1 every day until finished. 10/30/23   Curatolo, Adam, DO  cetirizine  (ZYRTEC  ALLERGY) 10 MG tablet Take 1 tablet (10 mg total) by mouth daily. 03/05/23   Rising, Asberry, PA-C  cyclobenzaprine  (FLEXERIL ) 5 MG tablet Take 1 tablet (5 mg total) by mouth 3 (three) times daily as needed for muscle spasms. 06/14/23   Love, Sharlet RAMAN, PA-C  diazepam  (VALIUM ) 10 MG tablet Take 0.5 tablets (5 mg total) by mouth every 12 (twelve) hours as needed for anxiety. 06/14/23   Love, Sharlet RAMAN, PA-C  fluticasone  (FLONASE ) 50 MCG/ACT nasal spray Place 2 sprays into both nostrils daily. 03/05/23   Rising, Asberry, PA-C  furosemide  (LASIX ) 40 MG tablet Take 1 tablet (40 mg total) by mouth daily. 06/14/23   Love, Sharlet RAMAN, PA-C  gabapentin  (NEURONTIN ) 300 MG capsule Take 1 capsule (300  mg total) by mouth 3 (three) times daily. 06/14/23   Love, Sharlet RAMAN, PA-C  lidocaine  (LIDODERM ) 5 % Place 2 patches onto the skin daily. Remove & Discard patch within 12 hours or as directed by MD 06/14/23   Love, Sharlet RAMAN, PA-C  magnesium  oxide (MAG-OX) 400 MG tablet Take 0.5 tablets (200 mg total) by mouth daily. 06/14/23   Love, Sharlet RAMAN, PA-C  meclizine  (ANTIVERT ) 25 MG tablet Take 1 tablet (25 mg total) by mouth 3 (three) times daily as needed for dizziness. 05/22/24   Geiple, Joshua, PA-C  melatonin 5 MG TABS Take 1 tablet (5 mg total) by mouth at bedtime as needed. 06/14/23   Love, Sharlet RAMAN, PA-C  Menthol , Topical Analgesic, (ABSORBINE JR BACK PATCH EX) Apply 1 patch topically daily as needed (for back pain).    [provider]  methocarbamol  (ROBAXIN ) 750 MG tablet Take 1 tablet (750 mg total) by mouth every 6 (six) hours. 06/14/23   Love, Sharlet RAMAN, PA-C  naloxone  (NARCAN ) nasal spray 4 mg/0.1 mL Place 1 spray into the nose as needed. In case of overdose 06/14/23   Love, Sharlet RAMAN, PA-C  omeprazole  (PRILOSEC) 40 MG capsule Take 40 mg by mouth daily.    [provider]  ondansetron  (ZOFRAN -ODT) 4 MG disintegrating tablet Take 1 tablet (4 mg total) by mouth every 8 (eight) hours as needed for nausea or vomiting. 05/22/24   Desiderio Chew,  PA-C  oxyCODONE  (OXY IR/ROXICODONE ) 5 MG immediate release tablet Take 1-2 tablets (5-10 mg total) by mouth every 4 (four) hours as needed (5mg  for paim 5-7 level and 10 mg for pain > level 7). 06/14/23   Love, Sharlet RAMAN, PA-C  phentermine  (ADIPEX-P ) 37.5 MG tablet Take 37.5 mg by mouth daily. 04/21/24   [provider]  polyethylene glycol powder (GLYCOLAX /MIRALAX ) 17 GM/SCOOP powder Take 1 capful (17 g) by mouth 2 (two) times daily. 06/14/23   Love, Sharlet RAMAN, PA-C  potassium chloride  SA (KLOR-CON  M) 20 MEQ tablet Take 1 tablet (20 mEq total) by mouth 2 (two) times daily. 06/14/23   Love, Sharlet RAMAN, PA-C  rivaroxaban  (XARELTO ) 20 MG TABS tablet Take 1 tablet  (20 mg total) by mouth daily with supper. 06/14/23   Love, Sharlet RAMAN, PA-C  traMADol  (ULTRAM ) 50 MG tablet Take 1 tablet (50 mg total) by mouth every 6 (six) hours. 06/14/23   Love, Sharlet RAMAN, PA-C  vitamin D3 (CHOLECALCIFEROL ) 25 MCG tablet Take 2 tablets (2,000 Units total) by mouth 2 (two) times daily. 06/14/23   Love, Sharlet RAMAN, PA-C    Allergies: Contrast media [iodinated contrast media], Percocet [oxycodone -acetaminophen ], Toradol  [ketorolac  tromethamine ], Tylenol  [acetaminophen ], and Vicodin [hydrocodone -acetaminophen ]    Review of Systems  Gastrointestinal:  Positive for abdominal pain, diarrhea and vomiting.    Updated Vital Signs BP (!) 168/76 (BP Location: Left Arm)   Pulse (!) 50   Temp 97.8 F (36.6 C)   Resp 20   Ht 5' 5 (1.651 m)   Wt 124.7 kg   LMP 12/28/2015   SpO2 100%   BMI 45.76 kg/m   Physical Exam Vitals and nursing note reviewed.  HENT:     Head: Normocephalic and atraumatic.     Mouth/Throat:     Mouth: Mucous membranes are moist.  Eyes:     General:        Right eye: No discharge.        Left eye: No discharge.     Conjunctiva/sclera: Conjunctivae normal.  Cardiovascular:     Rate and Rhythm: Normal rate and regular rhythm.     Pulses: Normal pulses.     Heart sounds: Normal heart sounds.  Pulmonary:     Effort: Pulmonary effort is normal.     Breath sounds: Normal breath sounds.  Abdominal:     General: Abdomen is flat.     Palpations: Abdomen is soft.     Tenderness: There is abdominal tenderness in the right lower quadrant.  Skin:    General: Skin is warm and dry.  Neurological:     General: No focal deficit present.  Psychiatric:        Mood and Affect: Mood normal.     (all labs ordered are listed, but only abnormal results are displayed) Labs Reviewed  COMPREHENSIVE METABOLIC PANEL WITH GFR - Abnormal; Notable for the following components:      Result Value   Glucose, Bld 106 (*)    Total Protein 8.4 (*)    All other components  within normal limits  CBC - Abnormal; Notable for the following components:   Hemoglobin 15.1 (*)    All other components within normal limits  LIPASE, BLOOD  URINALYSIS, ROUTINE W REFLEX MICROSCOPIC  TROPONIN T, HIGH SENSITIVITY  TROPONIN T, HIGH SENSITIVITY    EKG: None  Radiology: CT ABDOMEN PELVIS WO CONTRAST Result Date: 11/26/2024 EXAM: CT ABDOMEN AND PELVIS WITHOUT CONTRAST 11/26/2024 11:40:25 PM TECHNIQUE: CT of the  abdomen and pelvis was performed without the administration of intravenous contrast. Multiplanar reformatted images are provided for review. Automated exposure control, iterative reconstruction, and/or weight-based adjustment of the mA/kV was utilized to reduce the radiation dose to as low as reasonably achievable. COMPARISON: CT abdomen and pelvis 05/21/2024. CLINICAL HISTORY: Abdominal pain, acute, nonlocalized. FINDINGS: LOWER CHEST: No acute abnormality. LIVER: The liver is unremarkable. GALLBLADDER AND BILE DUCTS: Gallbladder surgically absent. No biliary ductal dilatation. SPLEEN: No acute abnormality. PANCREAS: No acute abnormality. ADRENAL GLANDS: No acute abnormality. KIDNEYS, URETERS AND BLADDER: No stones in the kidneys or ureters. No hydronephrosis. No perinephric or periureteral stranding. Urinary bladder is unremarkable. GI AND BOWEL: Stomach demonstrates no acute abnormality. There are scattered colonic diverticula. The appendix appears normal. There is no bowel obstruction. PERITONEUM AND RETROPERITONEUM: No ascites. No free air. VASCULATURE: Aorta is normal in caliber. LYMPH NODES: No lymphadenopathy. REPRODUCTIVE ORGANS: Undiuterine fibroid measuring 4.5 cm appears unchanged. BONES AND SOFT TISSUES: Chronic compression deformity of T12 appears unchanged. No focal soft tissue abnormality. IMPRESSION: 1. No acute findings in the abdomen or pelvis. 2. Gallbladder surgically absent. 3. Scattered colonic diverticula without evidence of diverticulitis. 4. Uterine  fibroid measuring 4.5 cm, unchanged. Electronically signed by: Greig Pique MD 11/26/2024 11:48 PM EST RP Workstation: [id number]   DG Chest 2 View Result Date: 11/26/2024 CLINICAL DATA:  Abdominal pain, nausea, vomiting and diarrhea. EXAM: CHEST - 2 VIEW COMPARISON:  July 20, 2024 FINDINGS: The heart size and mediastinal contours are within normal limits. Both lungs are clear. A solitary radiopaque surgical clip is seen along the posterior aspect of the left lung base. Radiopaque surgical clips are noted within the right upper quadrant. The visualized skeletal structures are unremarkable. IMPRESSION: No active cardiopulmonary disease. Electronically Signed   By: Suzen Dials M.D.   On: 11/26/2024 19:06     Procedures   Medications Ordered in the ED  ondansetron  (ZOFRAN -ODT) disintegrating tablet 4 mg (4 mg Oral Given 11/26/24 1803)  ondansetron  (ZOFRAN ) injection 4 mg (4 mg Intravenous Given 11/26/24 2310)  morphine  (PF) 4 MG/ML injection 4 mg (4 mg Intravenous Given 11/26/24 2310)  sodium chloride  0.9 % bolus 1,000 mL (1,000 mLs Intravenous New Bag/Given 11/26/24 2311)                                    Medical Decision Making Amount and/or Complexity of Data Reviewed Labs: ordered. Radiology: ordered.  Risk Prescription drug management.   Initial Impression and Ddx 59 yo well appearing female presenting abdominal pain with n/v/d. Exam presenting RLQ pain. Ddx includes appendicitis, bowel obstruction, diverticulitis, acute pancreatitis, ovarian torsion, other. Patient PMH that increases complexity of ED encounter: h/o PE on xarelto , s/p cholecystectomy, chronic pain, HTN  Interpretation of Diagnostics - I independent reviewed and interpreted the labs as followed: unremarkable  -I independently reviewed and interpreted CT which was nonacute  - I personally reviewed and interpreted EKG which revealed sinus bradycardia  Patient Reassessment and Ultimate  Disposition/Management Workup overall reassuring with nonacute CT.  Able to fluid challenge with no issue.  Suspect this is likely a viral illness.  Advised supportive treatment at home.  Sent Zofran  to her pharmacy.  Discussed return precautions.  Discharged good condition.  Patient management required discussion with the following services or consulting groups:  None  Complexity of Problems Addressed Acute complicated illness or Injury  Additional Data Reviewed and Analyzed Further history obtained from:  Past medical history and medications listed in the EMR and Prior ED visit notes  Patient Encounter Risk Assessment Consideration of hospitalization      Final diagnoses:  Abdominal pain, unspecified abdominal location    ED Discharge Orders          Ordered    ondansetron  (ZOFRAN ) 4 MG tablet  Every 6 hours        11/27/24 0000               Magaline Steinberg K, PA-C 11/27/24 0001    Tegeler, Lonni PARAS, MD 11/29/24 [phone]  "

## 2024-11-26 NOTE — ED Triage Notes (Signed)
 PT c/o abdominal pain, nausea, vomiting, and diarrhea since 0600 this morning. Pt denies any urinary symptoms. Pt AxOx4.

## 2024-11-27 ENCOUNTER — Ambulatory Visit: Admitting: Cardiology

## 2024-11-27 ENCOUNTER — Telehealth: Payer: Self-pay | Admitting: *Deleted

## 2024-11-27 DIAGNOSIS — I10 Essential (primary) hypertension: Secondary | ICD-10-CM

## 2024-11-27 MED ORDER — ONDANSETRON HCL 4 MG PO TABS: 4.0000 mg | ORAL_TABLET | Freq: Four times a day (QID) | ORAL | 0 refills | Status: AC

## 2024-11-27 NOTE — Progress Notes (Signed)
 Complex Care Management Note Care Guide Note  11/27/2024 Name: Katie [name] MRN: [id number] DOB: 07-04-1966   Complex Care Management Outreach Attempts: An unsuccessful telephone outreach was attempted today to offer the patient information about available complex care management services.  Follow Up Plan:  Additional outreach attempts will be made to offer the patient complex care management information and services.   Encounter Outcome:  No Answer  Harlene Satterfield  [facility]  [facility] Guide  Direct Dial: [phone]  Fax [phone]

## 2024-11-27 NOTE — ED Notes (Signed)
Pt drinking ginger ale for po challenge.

## 2024-11-27 NOTE — Discharge Instructions (Addendum)
 Evaluation for your abdominal pain is overall reassuring.  I sent Zofran  to your pharmacy.  Suspect this is likely a viral illness.  Please continue assertive hydration with water and Gatorade at home.  Please follow-up with your PCP.  If your symptoms worsen please return to the ED for further evaluation.

## 2024-12-02 NOTE — Progress Notes (Signed)
 Complex Care Management Note Care Guide Note  12/02/2024 Name: Katie [name] MRN: [id number] DOB: [birth year]   Complex Care Management Outreach Attempts: A second unsuccessful outreach was attempted today to offer the patient with information about available complex care management services.  Follow Up Plan:  Additional outreach attempts will be made to offer the patient complex care management information and services.   Encounter Outcome:  No Answer  Harlene Satterfield  [facility]  [facility] Guide  Direct Dial: [phone]  Fax [phone]

## 2024-12-02 NOTE — Progress Notes (Signed)
 Complex Care Management Note  Care Guide Note 12/02/2024 Name: Katie [name] MRN: [id number] DOB: [birth year]  Katie [name] is a 59 y.o. year old female who sees Sim, Emery CROME, MD for primary care. I reached out to Katie [name] by phone today to offer complex care management services.  Katie [name] was given information about Complex Care Management services today including:   The Complex Care Management services include support from the care team which includes your Nurse Care Manager, Clinical Social Worker, or Pharmacist.  The Complex Care Management team is here to help remove barriers to the health concerns and goals most important to you. Complex Care Management services are voluntary, and the patient may decline or stop services at any time by request to their care team member.   Complex Care Management Consent Status: Patient did not agree to participate in complex care management services at this time.   Encounter Outcome:  Patient Refused  Katie [name]  [facility]  [facility] Guide  Direct Dial: [phone]  Fax [phone]
